# Patient Record
Sex: Male | Born: 1970 | Race: Black or African American | Hispanic: No | Marital: Single | Smoking: Former smoker
Health system: Southern US, Community
[De-identification: ages and names within clinical notes are randomized; demographics above are authoritative.]

## PROBLEM LIST (undated history)

## (undated) DIAGNOSIS — D649 Anemia, unspecified: Secondary | ICD-10-CM

## (undated) DIAGNOSIS — D759 Disease of blood and blood-forming organs, unspecified: Secondary | ICD-10-CM

## (undated) DIAGNOSIS — R011 Cardiac murmur, unspecified: Secondary | ICD-10-CM

## (undated) DIAGNOSIS — M1990 Unspecified osteoarthritis, unspecified site: Secondary | ICD-10-CM

## (undated) DIAGNOSIS — Z940 Kidney transplant status: Secondary | ICD-10-CM

## (undated) DIAGNOSIS — Z5189 Encounter for other specified aftercare: Secondary | ICD-10-CM

## (undated) DIAGNOSIS — F329 Major depressive disorder, single episode, unspecified: Secondary | ICD-10-CM

## (undated) DIAGNOSIS — T4145XA Adverse effect of unspecified anesthetic, initial encounter: Secondary | ICD-10-CM

## (undated) DIAGNOSIS — I77810 Thoracic aortic ectasia: Secondary | ICD-10-CM

## (undated) DIAGNOSIS — I2510 Atherosclerotic heart disease of native coronary artery without angina pectoris: Secondary | ICD-10-CM

## (undated) DIAGNOSIS — I428 Other cardiomyopathies: Secondary | ICD-10-CM

## (undated) DIAGNOSIS — I3139 Other pericardial effusion (noninflammatory): Secondary | ICD-10-CM

## (undated) DIAGNOSIS — I5032 Chronic diastolic (congestive) heart failure: Secondary | ICD-10-CM

## (undated) DIAGNOSIS — I10 Essential (primary) hypertension: Secondary | ICD-10-CM

## (undated) DIAGNOSIS — G4730 Sleep apnea, unspecified: Secondary | ICD-10-CM

## (undated) DIAGNOSIS — Z992 Dependence on renal dialysis: Secondary | ICD-10-CM

## (undated) DIAGNOSIS — J189 Pneumonia, unspecified organism: Secondary | ICD-10-CM

## (undated) DIAGNOSIS — N529 Male erectile dysfunction, unspecified: Secondary | ICD-10-CM

## (undated) DIAGNOSIS — I499 Cardiac arrhythmia, unspecified: Secondary | ICD-10-CM

## (undated) DIAGNOSIS — F419 Anxiety disorder, unspecified: Secondary | ICD-10-CM

## (undated) DIAGNOSIS — F909 Attention-deficit hyperactivity disorder, unspecified type: Secondary | ICD-10-CM

## (undated) DIAGNOSIS — IMO0001 Reserved for inherently not codable concepts without codable children: Secondary | ICD-10-CM

## (undated) DIAGNOSIS — N186 End stage renal disease: Secondary | ICD-10-CM

## (undated) DIAGNOSIS — B009 Herpesviral infection, unspecified: Secondary | ICD-10-CM

## (undated) DIAGNOSIS — T8859XA Other complications of anesthesia, initial encounter: Secondary | ICD-10-CM

## (undated) DIAGNOSIS — I313 Pericardial effusion (noninflammatory): Secondary | ICD-10-CM

## (undated) DIAGNOSIS — F32A Depression, unspecified: Secondary | ICD-10-CM

## (undated) DIAGNOSIS — F319 Bipolar disorder, unspecified: Secondary | ICD-10-CM

## (undated) HISTORY — DX: Pericardial effusion (noninflammatory): I31.3

## (undated) HISTORY — DX: Male erectile dysfunction, unspecified: N52.9

## (undated) HISTORY — DX: Depression, unspecified: F32.A

## (undated) HISTORY — PX: CHOLECYSTECTOMY: SHX55

## (undated) HISTORY — DX: Encounter for other specified aftercare: Z51.89

## (undated) HISTORY — DX: Bipolar disorder, unspecified: F31.9

## (undated) HISTORY — DX: Unspecified osteoarthritis, unspecified site: M19.90

## (undated) HISTORY — PX: MOUTH SURGERY: SHX715

## (undated) HISTORY — DX: Thoracic aortic ectasia: I77.810

## (undated) HISTORY — PX: KNEE SURGERY: SHX244

## (undated) HISTORY — DX: Attention-deficit hyperactivity disorder, unspecified type: F90.9

## (undated) HISTORY — DX: Other pericardial effusion (noninflammatory): I31.39

## (undated) HISTORY — DX: Anemia, unspecified: D64.9

## (undated) HISTORY — PX: ANGIOPLASTY: SHX39

## (undated) HISTORY — PX: DIALYSIS FISTULA CREATION: SHX611

## (undated) HISTORY — DX: Reserved for inherently not codable concepts without codable children: IMO0001

## (undated) HISTORY — PX: NEPHRECTOMY TRANSPLANTED ORGAN: SUR880

## (undated) HISTORY — DX: Other cardiomyopathies: I42.8

## (undated) HISTORY — DX: Kidney transplant status: Z94.0

## (undated) HISTORY — DX: Chronic diastolic (congestive) heart failure: I50.32

## (undated) HISTORY — DX: Major depressive disorder, single episode, unspecified: F32.9

## (undated) HISTORY — DX: Anxiety disorder, unspecified: F41.9

---

## 1999-07-14 ENCOUNTER — Encounter: Admission: RE | Admit: 1999-07-14 | Discharge: 1999-07-28 | Payer: Self-pay

## 1999-08-24 ENCOUNTER — Encounter: Admission: RE | Admit: 1999-08-24 | Discharge: 1999-09-01 | Payer: Self-pay

## 1999-12-12 ENCOUNTER — Emergency Department (HOSPITAL_COMMUNITY): Admission: EM | Admit: 1999-12-12 | Discharge: 1999-12-12 | Payer: Self-pay

## 2000-07-07 ENCOUNTER — Emergency Department (HOSPITAL_COMMUNITY): Admission: EM | Admit: 2000-07-07 | Discharge: 2000-07-08 | Payer: Self-pay | Admitting: Emergency Medicine

## 2002-03-07 ENCOUNTER — Emergency Department (HOSPITAL_COMMUNITY): Admission: EM | Admit: 2002-03-07 | Discharge: 2002-03-07 | Payer: Self-pay | Admitting: Emergency Medicine

## 2002-11-16 ENCOUNTER — Emergency Department (HOSPITAL_COMMUNITY): Admission: EM | Admit: 2002-11-16 | Discharge: 2002-11-16 | Payer: Self-pay | Admitting: Emergency Medicine

## 2004-07-20 ENCOUNTER — Emergency Department (HOSPITAL_COMMUNITY): Admission: EM | Admit: 2004-07-20 | Discharge: 2004-07-20 | Payer: Self-pay | Admitting: Emergency Medicine

## 2004-07-30 ENCOUNTER — Emergency Department (HOSPITAL_COMMUNITY): Admission: EM | Admit: 2004-07-30 | Discharge: 2004-07-30 | Payer: Self-pay | Admitting: Emergency Medicine

## 2004-11-07 ENCOUNTER — Emergency Department (HOSPITAL_COMMUNITY): Admission: EM | Admit: 2004-11-07 | Discharge: 2004-11-07 | Payer: Self-pay | Admitting: Emergency Medicine

## 2006-02-18 ENCOUNTER — Emergency Department (HOSPITAL_COMMUNITY): Admission: EM | Admit: 2006-02-18 | Discharge: 2006-02-18 | Payer: Self-pay | Admitting: Emergency Medicine

## 2007-07-22 ENCOUNTER — Emergency Department (HOSPITAL_COMMUNITY): Admission: EM | Admit: 2007-07-22 | Discharge: 2007-07-22 | Payer: Self-pay | Admitting: Emergency Medicine

## 2007-08-09 ENCOUNTER — Emergency Department (HOSPITAL_COMMUNITY): Admission: EM | Admit: 2007-08-09 | Discharge: 2007-08-09 | Payer: Self-pay | Admitting: Emergency Medicine

## 2007-11-03 ENCOUNTER — Observation Stay (HOSPITAL_COMMUNITY): Admission: EM | Admit: 2007-11-03 | Discharge: 2007-11-05 | Payer: Self-pay | Admitting: Emergency Medicine

## 2007-11-03 ENCOUNTER — Ambulatory Visit: Payer: Self-pay | Admitting: *Deleted

## 2008-02-05 ENCOUNTER — Inpatient Hospital Stay (HOSPITAL_BASED_OUTPATIENT_CLINIC_OR_DEPARTMENT_OTHER): Admission: RE | Admit: 2008-02-05 | Discharge: 2008-02-05 | Payer: Self-pay | Admitting: Cardiology

## 2008-03-06 ENCOUNTER — Encounter (INDEPENDENT_AMBULATORY_CARE_PROVIDER_SITE_OTHER): Payer: Self-pay | Admitting: Emergency Medicine

## 2008-03-06 ENCOUNTER — Inpatient Hospital Stay (HOSPITAL_COMMUNITY): Admission: EM | Admit: 2008-03-06 | Discharge: 2008-03-08 | Payer: Self-pay | Admitting: Emergency Medicine

## 2008-07-22 ENCOUNTER — Emergency Department (HOSPITAL_COMMUNITY): Admission: EM | Admit: 2008-07-22 | Discharge: 2008-07-22 | Payer: Self-pay | Admitting: Emergency Medicine

## 2008-11-28 ENCOUNTER — Ambulatory Visit: Payer: Self-pay | Admitting: Interventional Radiology

## 2008-11-28 ENCOUNTER — Emergency Department (HOSPITAL_BASED_OUTPATIENT_CLINIC_OR_DEPARTMENT_OTHER): Admission: EM | Admit: 2008-11-28 | Discharge: 2008-11-28 | Payer: Self-pay | Admitting: Emergency Medicine

## 2009-01-14 ENCOUNTER — Emergency Department (HOSPITAL_BASED_OUTPATIENT_CLINIC_OR_DEPARTMENT_OTHER): Admission: EM | Admit: 2009-01-14 | Discharge: 2009-01-14 | Payer: Self-pay | Admitting: Emergency Medicine

## 2011-02-16 ENCOUNTER — Emergency Department (HOSPITAL_COMMUNITY): Payer: Medicare Other

## 2011-02-16 ENCOUNTER — Inpatient Hospital Stay (HOSPITAL_COMMUNITY): Payer: Medicare Other

## 2011-02-16 ENCOUNTER — Inpatient Hospital Stay (HOSPITAL_COMMUNITY)
Admission: EM | Admit: 2011-02-16 | Discharge: 2011-02-24 | DRG: 291 | Disposition: A | Discharge status: Skilled Nursing Facility | Payer: Medicare Other | Attending: Family Medicine | Admitting: Family Medicine

## 2011-02-16 ENCOUNTER — Encounter (HOSPITAL_COMMUNITY): Payer: Self-pay | Admitting: Radiology

## 2011-02-16 DIAGNOSIS — N139 Obstructive and reflux uropathy, unspecified: Secondary | ICD-10-CM

## 2011-02-16 DIAGNOSIS — D649 Anemia, unspecified: Secondary | ICD-10-CM | POA: Diagnosis not present

## 2011-02-16 DIAGNOSIS — Z940 Kidney transplant status: Secondary | ICD-10-CM

## 2011-02-16 DIAGNOSIS — R319 Hematuria, unspecified: Secondary | ICD-10-CM | POA: Diagnosis present

## 2011-02-16 DIAGNOSIS — F4310 Post-traumatic stress disorder, unspecified: Secondary | ICD-10-CM | POA: Diagnosis present

## 2011-02-16 DIAGNOSIS — J9600 Acute respiratory failure, unspecified whether with hypoxia or hypercapnia: Secondary | ICD-10-CM | POA: Diagnosis present

## 2011-02-16 DIAGNOSIS — R109 Unspecified abdominal pain: Secondary | ICD-10-CM | POA: Diagnosis present

## 2011-02-16 DIAGNOSIS — N186 End stage renal disease: Secondary | ICD-10-CM

## 2011-02-16 DIAGNOSIS — Z91199 Patient's noncompliance with other medical treatment and regimen due to unspecified reason: Secondary | ICD-10-CM

## 2011-02-16 DIAGNOSIS — N3289 Other specified disorders of bladder: Secondary | ICD-10-CM | POA: Diagnosis present

## 2011-02-16 DIAGNOSIS — I120 Hypertensive chronic kidney disease with stage 5 chronic kidney disease or end stage renal disease: Secondary | ICD-10-CM | POA: Diagnosis present

## 2011-02-16 DIAGNOSIS — I501 Left ventricular failure: Secondary | ICD-10-CM

## 2011-02-16 DIAGNOSIS — J189 Pneumonia, unspecified organism: Secondary | ICD-10-CM | POA: Diagnosis present

## 2011-02-16 DIAGNOSIS — Z9119 Patient's noncompliance with other medical treatment and regimen: Secondary | ICD-10-CM

## 2011-02-16 DIAGNOSIS — D696 Thrombocytopenia, unspecified: Secondary | ICD-10-CM | POA: Diagnosis not present

## 2011-02-16 DIAGNOSIS — I517 Cardiomegaly: Secondary | ICD-10-CM | POA: Diagnosis present

## 2011-02-16 DIAGNOSIS — F319 Bipolar disorder, unspecified: Secondary | ICD-10-CM | POA: Diagnosis present

## 2011-02-16 DIAGNOSIS — C679 Malignant neoplasm of bladder, unspecified: Secondary | ICD-10-CM

## 2011-02-16 HISTORY — DX: Essential (primary) hypertension: I10

## 2011-02-16 LAB — CBC
HCT: 36.3 % — ABNORMAL LOW (ref 39.0–52.0)
Hemoglobin: 11.9 g/dL — ABNORMAL LOW (ref 13.0–17.0)
MCH: 31.6 pg (ref 26.0–34.0)
MCHC: 32.8 g/dL (ref 30.0–36.0)
MCV: 96.3 fL (ref 78.0–100.0)
Platelets: 128 K/uL — ABNORMAL LOW (ref 150–400)
RBC: 3.77 MIL/uL — ABNORMAL LOW (ref 4.22–5.81)
RDW: 13.5 % (ref 11.5–15.5)
WBC: 5.9 10*3/uL (ref 4.0–10.5)

## 2011-02-16 LAB — POCT I-STAT 3, ART BLOOD GAS (G3+)
Acid-Base Excess: 4 mmol/L — ABNORMAL HIGH (ref 0.0–2.0)
Bicarbonate: 29.1 meq/L — ABNORMAL HIGH (ref 20.0–24.0)
Bicarbonate: 30.3 meq/L — ABNORMAL HIGH (ref 20.0–24.0)
O2 Saturation: 72 %
O2 Saturation: 96 %
Patient temperature: 98
TCO2: 31 mmol/L (ref 0–100)
TCO2: 32 mmol/L (ref 0–100)
pCO2 arterial: 70.8 mmHg (ref 35.0–45.0)
pCO2 arterial: 49 mmHg — ABNORMAL HIGH (ref 35.0–45.0)
pH, Arterial: 7.221 — ABNORMAL LOW (ref 7.350–7.450)
pH, Arterial: 7.397 (ref 7.350–7.450)
pO2, Arterial: 47 mmHg — ABNORMAL LOW (ref 80.0–100.0)
pO2, Arterial: 81 mmHg (ref 80.0–100.0)

## 2011-02-16 LAB — BASIC METABOLIC PANEL WITH GFR
CO2: 23 meq/L (ref 19–32)
GFR calc non Af Amer: 5 mL/min — ABNORMAL LOW (ref >60)
Potassium: 4.6 meq/L (ref 3.5–5.1)

## 2011-02-16 LAB — DIFFERENTIAL
Basophils Absolute: 0 10*3/uL (ref 0.0–0.1)
Basophils Relative: 0 % (ref 0–1)
Eosinophils Absolute: 0.3 10*3/uL (ref 0.0–0.7)
Eosinophils Relative: 4 % (ref 0–5)
Lymphocytes Relative: 17 % (ref 12–46)
Lymphs Abs: 1 10*3/uL (ref 0.7–4.0)
Monocytes Absolute: 0.6 K/uL (ref 0.1–1.0)
Monocytes Relative: 10 % (ref 3–12)
Neutro Abs: 4 K/uL (ref 1.7–7.7)
Neutrophils Relative %: 69 % (ref 43–77)

## 2011-02-16 LAB — BASIC METABOLIC PANEL
BUN: 35 mg/dL — ABNORMAL HIGH (ref 6–23)
Calcium: 9.8 mg/dL (ref 8.4–10.5)
Chloride: 100 mEq/L (ref 96–112)
Creatinine, Ser: 12.37 mg/dL — ABNORMAL HIGH (ref 0.4–1.5)
GFR calc Af Amer: 6 mL/min — ABNORMAL LOW (ref >60)
Glucose, Bld: 115 mg/dL — ABNORMAL HIGH (ref 70–99)
Sodium: 138 mEq/L (ref 135–145)

## 2011-02-16 LAB — BLOOD GAS, ARTERIAL
Acid-Base Excess: 0.3 mmol/L (ref 0.0–2.0)
FIO2: 1 %
O2 Saturation: 98.1 %
Patient temperature: 98.6
pO2, Arterial: 123 mmHg — ABNORMAL HIGH (ref 80.0–100.0)

## 2011-02-16 LAB — GLUCOSE, CAPILLARY: Glucose-Capillary: 89 mg/dL (ref 70–99)

## 2011-02-16 LAB — CARDIAC PANEL(CRET KIN+CKTOT+MB+TROPI): CK, MB: 4.2 ng/mL — ABNORMAL HIGH (ref 0.3–4.0)

## 2011-02-16 MED ORDER — IOHEXOL 300 MG/ML  SOLN
100.0000 mL | Freq: Once | INTRAMUSCULAR | Status: AC | PRN
  Administered 2011-02-16: 100 mL via INTRAVENOUS

## 2011-02-17 ENCOUNTER — Inpatient Hospital Stay (HOSPITAL_COMMUNITY): Payer: Medicare Other

## 2011-02-17 DIAGNOSIS — J9600 Acute respiratory failure, unspecified whether with hypoxia or hypercapnia: Secondary | ICD-10-CM

## 2011-02-17 DIAGNOSIS — I10 Essential (primary) hypertension: Secondary | ICD-10-CM

## 2011-02-17 DIAGNOSIS — J189 Pneumonia, unspecified organism: Secondary | ICD-10-CM

## 2011-02-17 DIAGNOSIS — N186 End stage renal disease: Secondary | ICD-10-CM

## 2011-02-17 LAB — CBC
Hemoglobin: 10.4 g/dL — ABNORMAL LOW (ref 13.0–17.0)
MCH: 31.6 pg (ref 26.0–34.0)
Platelets: 76 10*3/uL — ABNORMAL LOW (ref 150–400)
RBC: 3.29 MIL/uL — ABNORMAL LOW (ref 4.22–5.81)

## 2011-02-17 LAB — MRSA PCR SCREENING: MRSA by PCR: NEGATIVE

## 2011-02-17 LAB — CARDIAC PANEL(CRET KIN+CKTOT+MB+TROPI)
CK, MB: 4.1 ng/mL — ABNORMAL HIGH (ref 0.3–4.0)
CK, MB: 3 ng/mL (ref 0.3–4.0)
Relative Index: 2.2 (ref 0.0–2.5)
Relative Index: 1.6 (ref 0.0–2.5)
Total CK: 184 U/L (ref 7–232)
Troponin I: 0.13 ng/mL — ABNORMAL HIGH (ref 0.00–0.06)

## 2011-02-17 LAB — COMPREHENSIVE METABOLIC PANEL
Alkaline Phosphatase: 88 U/L (ref 39–117)
BUN: 28 mg/dL — ABNORMAL HIGH (ref 6–23)
Chloride: 98 mEq/L (ref 96–112)
Creatinine, Ser: 10.54 mg/dL — ABNORMAL HIGH (ref 0.4–1.5)
Glucose, Bld: 82 mg/dL (ref 70–99)
Potassium: 4.9 mEq/L (ref 3.5–5.1)
Total Bilirubin: 1 mg/dL (ref 0.3–1.2)

## 2011-02-17 LAB — RENAL FUNCTION PANEL
Albumin: 3.4 g/dL — ABNORMAL LOW (ref 3.5–5.2)
CO2: 28 mEq/L (ref 19–32)
Calcium: 9.2 mg/dL (ref 8.4–10.5)
Chloride: 98 mEq/L (ref 96–112)
Creatinine, Ser: 10.53 mg/dL — ABNORMAL HIGH (ref 0.4–1.5)
GFR calc Af Amer: 7 mL/min — ABNORMAL LOW (ref >60)
GFR calc non Af Amer: 6 mL/min — ABNORMAL LOW (ref >60)
Sodium: 137 mEq/L (ref 135–145)

## 2011-02-17 LAB — CK TOTAL AND CKMB (NOT AT ARMC)
CK, MB: 2.2 ng/mL (ref 0.3–4.0)
Relative Index: 2.1 (ref 0.0–2.5)

## 2011-02-17 LAB — POCT I-STAT 3, ART BLOOD GAS (G3+)
pCO2 arterial: 49.1 mmHg — ABNORMAL HIGH (ref 35.0–45.0)
pH, Arterial: 7.398 (ref 7.350–7.450)

## 2011-02-18 ENCOUNTER — Inpatient Hospital Stay (HOSPITAL_COMMUNITY): Payer: Medicare Other

## 2011-02-18 LAB — CBC
Hemoglobin: 9.8 g/dL — ABNORMAL LOW (ref 13.0–17.0)
Platelets: 87 10*3/uL — ABNORMAL LOW (ref 150–400)
RBC: 3.12 MIL/uL — ABNORMAL LOW (ref 4.22–5.81)
WBC: 6.8 10*3/uL (ref 4.0–10.5)

## 2011-02-18 LAB — PROCALCITONIN: Procalcitonin: 20.64 ng/mL

## 2011-02-18 LAB — BASIC METABOLIC PANEL
CO2: 29 mEq/L (ref 19–32)
Chloride: 97 mEq/L (ref 96–112)
GFR calc Af Amer: 9 mL/min — ABNORMAL LOW (ref >60)
Potassium: 4.3 mEq/L (ref 3.5–5.1)
Sodium: 136 mEq/L (ref 135–145)

## 2011-02-18 LAB — GLUCOSE, CAPILLARY: Glucose-Capillary: 81 mg/dL (ref 70–99)

## 2011-02-19 ENCOUNTER — Inpatient Hospital Stay (HOSPITAL_COMMUNITY): Payer: Medicare Other

## 2011-02-19 LAB — CBC
HCT: 25.9 % — ABNORMAL LOW (ref 39.0–52.0)
Hemoglobin: 8.7 g/dL — ABNORMAL LOW (ref 13.0–17.0)
MCH: 31.8 pg (ref 26.0–34.0)
MCHC: 33.6 g/dL (ref 30.0–36.0)
MCV: 94.5 fL (ref 78.0–100.0)
Platelets: 94 K/uL — ABNORMAL LOW (ref 150–400)
RBC: 2.74 MIL/uL — ABNORMAL LOW (ref 4.22–5.81)
RDW: 13.3 % (ref 11.5–15.5)
WBC: 5.7 K/uL (ref 4.0–10.5)

## 2011-02-19 LAB — RENAL FUNCTION PANEL
Albumin: 2.8 g/dL — ABNORMAL LOW (ref 3.5–5.2)
BUN: 51 mg/dL — ABNORMAL HIGH (ref 6–23)
CO2: 28 meq/L (ref 19–32)
Calcium: 9.4 mg/dL (ref 8.4–10.5)
Chloride: 98 meq/L (ref 96–112)
Creatinine, Ser: 11.91 mg/dL — ABNORMAL HIGH (ref 0.4–1.5)
GFR calc non Af Amer: 5 mL/min — ABNORMAL LOW
Glucose, Bld: 109 mg/dL — ABNORMAL HIGH (ref 70–99)
Phosphorus: 5.7 mg/dL — ABNORMAL HIGH (ref 2.3–4.6)
Potassium: 4.1 meq/L (ref 3.5–5.1)
Sodium: 136 meq/L (ref 135–145)

## 2011-02-19 LAB — SURGICAL PCR SCREEN
MRSA, PCR: NEGATIVE
Staphylococcus aureus: POSITIVE — AB

## 2011-02-19 LAB — CROSSMATCH
ABO/RH(D): A POS
Antibody Screen: NEGATIVE

## 2011-02-19 NOTE — H&P (Signed)
NAMEKOBYN, MEDAL                  ACCOUNT NO.:  [id number]  MEDICAL RECORD NO.:  [id number]           PATIENT TYPE:  I  LOCATION:  2104                         FACILITY:  [place]  PHYSICIAN:  [place] A. Walker Kehr, M.D.    DATE OF BIRTH:  March 02, 1971  DATE OF ADMISSION:  02/16/2011 DATE OF DISCHARGE:                             HISTORY & PHYSICAL   PRIMARY CARE PROVIDER:  No PCP, [facility].  CHIEF COMPLAINT:  Respiratory distress secondary to acute pulmonary edema, hypertension, pelvic pain.  HISTORY OF PRESENT ILLNESS:  Level-5 caveat applies to the patient.  Per ED note, the patient is a 40 year old male with a history of end-stage renal disease on hemodialysis and bladder cancer who presents to the 1- day history of severe pelvic and suprapubic pain.  The patient underwent cystoscopy with removal of a small tumor yesterday.  The patient removed his Foley this morning and endorsed increasing suprapubic and abdominal pain throughout the day.  In the emergency department, a Foley catheter was unable to be inserted and Urology decided that the Foley was not necessary.  Blood pressure continued to increase likely secondary to severe pain and missed dialysis appoinments.  Max blood pressure systolic  [id number] over diastolic 0000000. Additionally, the patient's O2 sats decreased to the 80s and patient was started on BiPAP in ED which improved O2 sats to 90%.  In the ED,  patient went into acute pulmonary edema.  A chest x-ray showed bilateral lung consolidation.  Renal and Urology were consulted in the ED.  The patient was immediately taken to dialysis and will be admitted to an ICU bed.  ALLERGIES:  NITRATES.  MEDICATIONS:  Clonidine, Carbatrol, and Abilify (awaiting med rec for dosages).  PAST MEDICAL HISTORY: 1. Bladder cancer status post cystoscopy with biopsy on February 15, 2011. 2. ESRD on hemodialysis, status post failed renal transplant. 3. Hypertension. 4.  Psychiatric disease, likely Bipolar based on meds 5. CHF, EF of 60% in 2009.  PAST SURGICAL HISTORY:  Renal transplant.  SOCIAL HISTORY:  Living situation is deferred secondary to the patient's level-5 caveat.  Per ED note, the patient denies tobacco, alcohol, or drug use.  FAMILY HISTORY:  Deferred.  REVIEW OF SYSTEMS:  Pertinent for shortness of breath and abdominal and pelvic pain.  PHYSICAL EXAMINATION:  VITAL SIGNS:  Temperature 97.5, pulse 93-112, respirations 16-39, blood pressure systolic 0000000 over diastolic [id number], pulse ox 90% on BiPAP. GENERAL:  Obtunded, in moderate to severe distress. HEENT:  NCAT.  EOMI.  PERRLA.  BiPAP in place. NECK:  Positive JVD.  Supple. CARDIOVASCULAR:  Tachycardic, regular rhythm.  No murmurs. LUNGS:  Clear to auscultation bilaterally anteriorly. ABDOMEN:  Moderately distended.  Active bowel sounds.  Tenderness on palpation of the right lower quadrant and suprapubic region. GU:  Bright red blood per urethra secondary to trauma. EXTREMITIES:  Skin warm and dry.  No cyanosis or clubbing.  +1 pedal pitting edema bilaterally. NEURO:  Deferred. MSK:  Dorsalis pedis pulses strong and equal bilaterally.  LABORATORY DATA AND STUDIES:  CBC; white count 5.9, hemoglobin 11.9,  hematocrit 36.3, platelets 128.  BMET; sodium 138, potassium 4.6, chloride 100, CO2 of 23, BUN 35, creatinine 12.37, glucose 115, pH 7.2, CO2 of 70.8, O2 of 47.0, bicarb 29.1.  Chest x-ray showed bilateral lung consolidation consistent with acute pneumonia.  ASSESSMENT/PLAN:  This is a 40 year old with a history of end-stage renal disease and bladder cancer who presents with acute pulmonary edema, HTN,  and severe pelvic pain. 1. Respiratory.  In the ED, the patient went to acute pulmonary edema     likely secondary to hypertension causing acute left ventricular     heart failure. Appreciate the renal consult for immediate dialysis.  After dialysis, patient will     be  transferred to the ICU.  The patient's O2 sats are currently     stable on BiPAP.  We will continue with BiPAP overnight and attempt     to wean to NRB as tolerated.  May consider consulting CCM     in the morning if respiratory distress does not improve.  We will     repeat an ABG and repeat a chest x-ray in the morning.  We will     monitor respiratory distress very closely. 2. Cardiology/hypertension.  Blood pressure in dialysis remains     200/100.  We will start clonidine 0.3 patch q. week.  Elevated     blood pressure likely secondary to noncompliance and severe pain.     We will treat the pain with morphine 2 mg IV q.4 h. as needed.     Will likely need to increase pain meds, but difficult to assess his     pain at this time due to level-5 caveat.  We may consider Dilaudid     or morphine drip in the morning if pain continues to be     uncontrolled.  For elevated blood pressures, we will start     labetalol 5 mg IV every 6 hours as needed for systolic blood     pressure over [id number], diastolic blood pressure over 100.  Goal blood     pressure is 160s-180s/80s-90s.  We will monitor this closely. 3. End-stage renal disease, on hemodialysis.  Appreciate renal consult     and recommendations.  We will recheck a BMET in the morning.  The     patient is in dialysis at this time. 4. Pelvic pain.  Please see #2. 5. Psych disorder, possibly bipolar disorder based on medications.  We     will hold the Abilify and Carbatrol for now.  We will restart his     meds when able to tolerate p.o. 6. FEN/GI.  N.p.o. 7. Prophylaxis.  SCDs until CT scan of abdomen and pelvis confirms no     active bleed or rupture. 8. Dispo.  Pending workup and clinical improvement.    ______________________________ Donnamarie Rossetti, MD   ______________________________ Arty Baumgartner. Walker Kehr, M.D.    ID/MEDQ  D:  02/16/2011  T:  02/17/2011  Job:  [id number]  Electronically Signed by Donnamarie Rossetti MD on 02/17/2011 [id number]  PM Electronically Signed by Candelaria Celeste M.D. on 02/19/2011 08:10:13 PM

## 2011-02-20 LAB — CBC
HCT: 27.8 % — ABNORMAL LOW (ref 39.0–52.0)
MCHC: 32.7 g/dL (ref 30.0–36.0)
MCV: 93.6 fL (ref 78.0–100.0)
RDW: 13.1 % (ref 11.5–15.5)

## 2011-02-20 LAB — BASIC METABOLIC PANEL
BUN: 38 mg/dL — ABNORMAL HIGH (ref 6–23)
Calcium: 9.8 mg/dL (ref 8.4–10.5)
GFR calc non Af Amer: 6 mL/min — ABNORMAL LOW (ref >60)
Glucose, Bld: 90 mg/dL (ref 70–99)

## 2011-02-21 ENCOUNTER — Inpatient Hospital Stay (HOSPITAL_COMMUNITY): Payer: Medicare Other

## 2011-02-21 LAB — DIFFERENTIAL
Basophils Relative: 1 % (ref 0–1)
Eosinophils Absolute: 0.5 10*3/uL (ref 0.0–0.7)
Eosinophils Relative: 15 % — ABNORMAL HIGH (ref 0–5)
Monocytes Absolute: 0.5 10*3/uL (ref 0.1–1.0)
Monocytes Relative: 16 % — ABNORMAL HIGH (ref 3–12)

## 2011-02-21 LAB — PTH, INTACT AND CALCIUM: PTH: 457 pg/mL — ABNORMAL HIGH (ref 14.0–72.0)

## 2011-02-21 LAB — RENAL FUNCTION PANEL
Calcium: 9.5 mg/dL (ref 8.4–10.5)
GFR calc Af Amer: 5 mL/min — ABNORMAL LOW (ref >60)
GFR calc non Af Amer: 4 mL/min — ABNORMAL LOW (ref >60)
Phosphorus: 8.4 mg/dL — ABNORMAL HIGH (ref 2.3–4.6)
Sodium: 134 mEq/L — ABNORMAL LOW (ref 135–145)

## 2011-02-21 LAB — CBC
HCT: 34.4 % — ABNORMAL LOW (ref 39.0–52.0)
MCHC: 33.6 g/dL (ref 30.0–36.0)
Platelets: 138 10*3/uL — ABNORMAL LOW (ref 150–400)
Platelets: 170 10*3/uL (ref 150–400)
RDW: 13 % (ref 11.5–15.5)
RDW: 14.7 % (ref 11.5–15.5)

## 2011-02-21 LAB — BASIC METABOLIC PANEL
BUN: 16 mg/dL (ref 6–23)
Calcium: 9.6 mg/dL (ref 8.4–10.5)
GFR calc non Af Amer: 9 mL/min — ABNORMAL LOW (ref >60)
Glucose, Bld: 110 mg/dL — ABNORMAL HIGH (ref 70–99)
Potassium: 4.4 mEq/L (ref 3.5–5.1)

## 2011-02-22 LAB — BASIC METABOLIC PANEL
BUN: 34 mg/dL — ABNORMAL HIGH (ref 6–23)
Calcium: 10.5 mg/dL (ref 8.4–10.5)
Creatinine, Ser: 9.56 mg/dL — ABNORMAL HIGH (ref 0.4–1.5)
GFR calc non Af Amer: 6 mL/min — ABNORMAL LOW (ref >60)
Glucose, Bld: 101 mg/dL — ABNORMAL HIGH (ref 70–99)
Potassium: 4.2 mEq/L (ref 3.5–5.1)

## 2011-02-22 LAB — CBC
HCT: 30.6 % — ABNORMAL LOW (ref 39.0–52.0)
MCHC: 33.3 g/dL (ref 30.0–36.0)
Platelets: 146 10*3/uL — ABNORMAL LOW (ref 150–400)
RDW: 13.3 % (ref 11.5–15.5)

## 2011-02-23 ENCOUNTER — Inpatient Hospital Stay (HOSPITAL_COMMUNITY): Payer: Medicare Other

## 2011-02-23 LAB — RENAL FUNCTION PANEL
Albumin: 3 g/dL — ABNORMAL LOW (ref 3.5–5.2)
Calcium: 9.3 mg/dL (ref 8.4–10.5)
Chloride: 95 mEq/L — ABNORMAL LOW (ref 96–112)
Creatinine, Ser: 13.36 mg/dL — ABNORMAL HIGH (ref 0.4–1.5)
GFR calc Af Amer: 5 mL/min — ABNORMAL LOW (ref >60)
GFR calc non Af Amer: 4 mL/min — ABNORMAL LOW (ref >60)

## 2011-02-23 LAB — CULTURE, BLOOD (ROUTINE X 2)
Culture  Setup Time: 201204121231
Culture  Setup Time: 201204121231
Culture: NO GROWTH

## 2011-02-23 LAB — CBC
MCH: 31.1 pg (ref 26.0–34.0)
Platelets: 182 10*3/uL (ref 150–400)
RBC: 3.25 MIL/uL — ABNORMAL LOW (ref 4.22–5.81)

## 2011-02-24 LAB — CBC
HCT: 30.7 % — ABNORMAL LOW (ref 39.0–52.0)
Hemoglobin: 10 g/dL — ABNORMAL LOW (ref 13.0–17.0)
MCH: 30.4 pg (ref 26.0–34.0)
MCHC: 32.6 g/dL (ref 30.0–36.0)
MCV: 93.3 fL (ref 78.0–100.0)
Platelets: 159 10*3/uL (ref 150–400)
RBC: 3.29 MIL/uL — ABNORMAL LOW (ref 4.22–5.81)
RDW: 13.2 % (ref 11.5–15.5)
WBC: 4.4 10*3/uL (ref 4.0–10.5)

## 2011-02-24 LAB — BASIC METABOLIC PANEL WITH GFR
BUN: 35 mg/dL — ABNORMAL HIGH (ref 6–23)
CO2: 29 meq/L (ref 19–32)
Calcium: 8.9 mg/dL (ref 8.4–10.5)
Chloride: 95 meq/L — ABNORMAL LOW (ref 96–112)
Creatinine, Ser: 10.12 mg/dL — ABNORMAL HIGH (ref 0.4–1.5)
GFR calc non Af Amer: 6 mL/min — ABNORMAL LOW
Glucose, Bld: 102 mg/dL — ABNORMAL HIGH (ref 70–99)
Potassium: 4 meq/L (ref 3.5–5.1)
Sodium: 133 meq/L — ABNORMAL LOW (ref 135–145)

## 2011-02-24 NOTE — Consult Note (Signed)
NAMEARAD, LEMBURG                  ACCOUNT NO.:  [id number]  MEDICAL RECORD NO.:  [id number]           PATIENT TYPE:  I  LOCATION:  [id number]                         FACILITY:  [place]  PHYSICIAN:  Marshall Cork. Jeffie Pollock, M.D.    DATE OF BIRTH:  Nov 12, 1970  DATE OF CONSULTATION:  02/18/2011 DATE OF DISCHARGE:                                CONSULTATION   CHIEF COMPLAINT:  Hematuria.  Briefly, [name] is a 40 year old African American male with end-stage renal disease on dialysis and failed transplant.  Earlier this week, he underwent resection of a bladder tumor at [facility] and was sent home with a Foley.  He took the Foley out on Wednesday and then developed severe pelvic pain.  He was seen in the emergency room here. Attempts at Foley catheter placement were unsuccessful, a CT scan revealed a large mass in the posterior bladder that was initially noted to be consistent with a tumor.  However, it is more likely a large clot ball.  He is having some bloody drainage from the penis at this time but significantly less pain.  His past history is significant for allergies to NITRATES.  MEDICATIONS:  Currently include clonidine, pantoprazole, Levaquin, nitroglycerin, labetalol, Tylenol, zolpidem, docusate, calcium carbonate, Phenergan as needed, hydroxyzine as needed.  PAST MEDICAL HISTORY:  Pertinent for renal failure as noted above and hypertension.  He has bipolar disorder and posttraumatic stress disorder.  He has had recent pulmonary edema this admission and had congestive heart failure in 2011 with and ejection fraction of 60%.  PAST SURGICAL HISTORY:  Pertinent for renal transplantation and recent TUR bladder tumor.  SOCIAL HISTORY:  Denies tobacco, alcohol, or drug use.  He works as a Transport planner.  FAMILY HISTORY:  Noncontributory.  REVIEW OF SYSTEMS:  He has reduced suprapubic pain and no further shortness of breath.  He is otherwise without complaints.  PHYSICAL  EXAMINATION:  VITAL SIGNS:  His blood pressure is 140/88, heart rate 85, and temperature 37.2. GENERAL:  He is well-developed, well-nourished African American male in no acute distress. ABDOMEN:  Soft with mild suprapubic tenderness with mass effect in the right lower quadrant from his transplant. GENITOURINARY:  There is blood at meatus but phallus is otherwise unremarkable.  Scrotum is unremarkable. EXTREMITIES:  AV fistula.  LABORATORY STUDIES:  Hemoglobin is 9.8 and platelets 87.  BUN is 25, creatinine is 8.4, his sodium is 136, and potassium is 4.3.  I have reviewed his CT films and report.  IMPRESSION: 1. Status post transurethral resection of bladder tumor postoperative     bleed now, a probable bladder for clots and blood at the urethral     meatus.  He has little urine output so he is not having acute     obstructive symptoms. 2. Recent pulmonary edema from fluid overload.  RECOMMENDATIONS:  He is reluctant to have a catheter attempted since that was failed x2 and has had problems with posttraumatic stress disorder and he is afraid that will aggravate his condition, so at this time, I am going to take him to the operating room in the morning  for cystoscopy with clot evacuation and fulguration.  The risks of bleeding, infection, bladder injury, and anesthetic complications were explained. He is agreeable to proceed with this and will be kept n.p.o.     Marshall Cork. Jeffie Pollock, M.D.     JJW/MEDQ  D:  02/18/2011  T:  02/19/2011  Job:  [id number]  Electronically Signed by Irine Seal M.D. on 02/24/2011 02:16:51 PM

## 2011-02-24 NOTE — Op Note (Signed)
[name], [name]                  ACCOUNT NO.:  [id number]  MEDICAL RECORD NO.:  [id number]           PATIENT TYPE:  I  LOCATION:  [id number]                         FACILITY:  [place]  PHYSICIAN:  Marshall Cork. Jeffie Pollock, M.D.    DATE OF BIRTH:  1971-05-26  DATE OF PROCEDURE: DATE OF DISCHARGE:                              OPERATIVE REPORT   PROCEDURE:  Cystoscopy, evacuation of clots and complex Foley catheter placement.  PREOPERATIVE DIAGNOSIS:  Clot retention.  POSTOPERATIVE DIAGNOSIS:  Clot retention with urethral false passage with urethral bleeding.  SURGEON:  Marshall Cork. Jeffie Pollock, MD  ANESTHESIA:  General.  SPECIMEN:  None.  DRAIN:  A 24-French 3-way Foley catheter.  COMPLICATIONS:  None.  INDICATIONS:  [name] is a 40 year old African American male with a recent cystoscopic procedure at [name] where he underwent resection of bladder tumor and fulguration of bleeding from a stitch from his renal transplant.  He presented to the [name] in severe pain on April 2011 and CT scanning revealed a large mass in the bladder.  Then on my review, it appears to be most consistent with clot.  He has had persistent urethral bleeding and it was felt that cystoscopy and clot evacuation was indicated.  FINDINGS AND PROCEDURE:  He had been on Levaquin.  He was taken to the operating room where [name] were placed.  A general anesthetic was induced.  He was placed in lithotomy position.  His perineum and genitalia were prepped with Betadine solution.  He was draped in usual sterile fashion.  Time-out was performed.  Cystoscopy was performed using a 22-French scope and 12 degree lens examination revealed a normal anterior urethra.  However, the area of the bulb encountered some clot with bleeding and what appeared to be a posterior false passage with only significant difficulty.  I was able to eventually identify the true lumen and advance the scope into the bladder.  Once in the bladder,  I identified a large clot.  This was evacuated with the [facility] syringe. However, it was quite difficult as some of the clot had become organized, however, I was eventually able to get the entire clot out. Inspection of the bladder at this point demonstrated some hemorrhagic changes but no obvious active bleeding from the bladder mucosa. However, the urine would not clear despite repeated irrigation and inspection revealed what appeared to be some oozing at the bladder neck related to the false passage.  At this point, a guidewire was passed through the scope and initially a 24-French Ainsworth catheter was passed over the guidewire into the bladder.  The balloon was filled with 30 mL sterile fluid.  The catheter was held on traction while irrigated by hand.  I was never able to get the urine to clear sufficient to my satisfaction, particularly in face of the patient's severe oliguria as I did not think he would be to able clear even small amount of blood from the bladder.  At this point, a 24-French Rolanda Jay was removed over the wire and a 24- [place] 3-way Foley catheter was inserted after the tip had been  modified to allow passage over a guidewire.  Once the 24 catheter was in the bladder, the balloon was filled with 30 mL of sterile fluid.  The catheter was irrigated with the [place] syringe with just light pink return while held on traction.  The catheter was then placed to continuous irrigation and straight drainage and secured on traction to the patient's thigh.  He was taken down from lithotomy position.  His anesthetic was reversed.  He was moved to the recovery room in stable condition.  There were no complications.     Marshall Cork. Jeffie Pollock, M.D.     JJW/MEDQ  D:  02/19/2011  T:  02/19/2011  Job:  [id number]  cc:   Patrick [place] A. Walker Kehr, M.D. Sigmund I. Gaynelle Arabian, M.D. [place] Rosana Hoes, M.D. [facility]  Electronically Signed by Irine Seal M.D. on 02/24/2011 02:16:54 PM

## 2011-02-25 ENCOUNTER — Emergency Department (HOSPITAL_COMMUNITY)
Admission: EM | Admit: 2011-02-25 | Discharge: 2011-02-25 | Disposition: A | Discharge status: Home / Self Care | Payer: Medicare Other | Attending: Emergency Medicine | Admitting: Emergency Medicine

## 2011-02-25 DIAGNOSIS — F329 Major depressive disorder, single episode, unspecified: Secondary | ICD-10-CM | POA: Insufficient documentation

## 2011-02-25 DIAGNOSIS — R319 Hematuria, unspecified: Secondary | ICD-10-CM | POA: Insufficient documentation

## 2011-02-25 DIAGNOSIS — K219 Gastro-esophageal reflux disease without esophagitis: Secondary | ICD-10-CM | POA: Insufficient documentation

## 2011-02-25 DIAGNOSIS — F3289 Other specified depressive episodes: Secondary | ICD-10-CM | POA: Insufficient documentation

## 2011-02-25 DIAGNOSIS — Z79899 Other long term (current) drug therapy: Secondary | ICD-10-CM | POA: Insufficient documentation

## 2011-02-25 DIAGNOSIS — Z992 Dependence on renal dialysis: Secondary | ICD-10-CM | POA: Insufficient documentation

## 2011-02-25 DIAGNOSIS — I120 Hypertensive chronic kidney disease with stage 5 chronic kidney disease or end stage renal disease: Secondary | ICD-10-CM | POA: Insufficient documentation

## 2011-02-25 DIAGNOSIS — N186 End stage renal disease: Secondary | ICD-10-CM | POA: Insufficient documentation

## 2011-02-25 DIAGNOSIS — Z7982 Long term (current) use of aspirin: Secondary | ICD-10-CM | POA: Insufficient documentation

## 2011-02-25 DIAGNOSIS — Z8551 Personal history of malignant neoplasm of bladder: Secondary | ICD-10-CM | POA: Insufficient documentation

## 2011-02-25 LAB — DIFFERENTIAL
Basophils Absolute: 0 10*3/uL (ref 0.0–0.1)
Lymphocytes Relative: 41 % (ref 12–46)
Neutro Abs: 1.2 10*3/uL — ABNORMAL LOW (ref 1.7–7.7)
Neutrophils Relative %: 37 % — ABNORMAL LOW (ref 43–77)

## 2011-02-25 LAB — CBC
HCT: 32.8 % — ABNORMAL LOW (ref 39.0–52.0)
Hemoglobin: 11 g/dL — ABNORMAL LOW (ref 13.0–17.0)
RDW: 13.1 % (ref 11.5–15.5)
WBC: 3.3 10*3/uL — ABNORMAL LOW (ref 4.0–10.5)

## 2011-03-05 NOTE — Consult Note (Signed)
  NAMEEBBIE, BOURBON                  ACCOUNT NO.:  [id number]  MEDICAL RECORD NO.:  [id number]           PATIENT TYPE:  LOCATION:                                 FACILITY:  PHYSICIAN:  Arvil Persons, M.D.  DATE OF BIRTH:  [birth year]  DATE OF CONSULTATION: DATE OF DISCHARGE:                                CONSULTATION   ADDENDUM  The patient was seen by Franco Collet and he had a TUR bladder tumor at [facility] yesterday and he came to the emergency room today, complaining of abdominal pain.  The nurses inserted a Foley catheter, but could not irrigate the catheter.  Melissa removed the Foley catheter and she felt that the catheter was not in the bladder, but was unable to reinsert a #16, #18, or #14-French Coude catheter in the bladder.  I went to the emergency room to see him, but by that time, his condition had deteriorated, and he apparently went into pulmonary edema and was moved to the dialysis unit.  He has a history of renal failure with a kidney transplant in the past, but his creatinine was 12.37 and BUN 35.  The bladder scan showed only 140 mL of urine in the bladder.  At this point, I discussed it with Dr. Moshe Cipro, and we agreed that the patient does not need a Foley catheter at this time since he has minimal urinary output and we will leave Foley catheter out at this time, and if it is needed, they will call me back for Foley catheter insertion, at which time, I would do cystoscopy to insert the Foley catheter in the bladder.     Arvil Persons, M.D.     MN/MEDQ  D:  02/16/2011  T:  02/17/2011  Job:  [id number]  Electronically Signed by Hanley Ben M.D. on 03/05/2011 10:53:50 AM

## 2011-03-22 NOTE — Discharge Summary (Signed)
[name], [name]                  ACCOUNT NO.:  [id number]   MEDICAL RECORD NO.:  [id number]          PATIENT TYPE:  OBV   LOCATION:  3711                         FACILITY:  [facility]   PHYSICIAN:  Jerline Pain, MD      DATE OF BIRTH:  [birth year]   DATE OF ADMISSION:  11/03/2007  DATE OF DISCHARGE:                               DISCHARGE SUMMARY   DISCHARGE DIAGNOSES:  1. Chest pain, resolved.  2. End stage renal disease s/p failed transplant.  3. Hypertension; recent increase in October 2008  4. Long term medication use.  5. Pericardial effusion - likely uremic  6. LV systolic dysfunction - EF 40-45%, likely HTN etiology (no prior      cath)   HOSPITAL COURSE:  [name] is a 40 year old male patient who was just  seen Friday in our clinic.  He was started on Bidil for hypertension but  stated after he took a dose he had a headache and then developed chest  pain.  He was hospitalized over the weekend. His enzymes were  essentially negative. His troponin was normal.  He had a maximum CK  around 249 but MB fractions were low and non-relative.   After two days in the hospital we felt that he was ready for discharge  to home.  We switched him from Bidil over to hydralazine.   DISCHARGE MEDICATIONS:  1. Coreg 50 mg twice a day.  2. Lisinopril 40 mg daily.  3. Hydralazine 25 mg t.i.d.  4. Catapres 0.3 mg patch.  5. Ativan p.r.n.   He has had intolerance to Amlodipine due to lower extremity edema which  does not resolve with dialysis.   He is to stop his Bidil.   FOLLOWUP:  He will follow up with Dr. Marlou Porch in one week.  I will make  this followup appointment for him.  He is to continue his hemodialysis  every Monday, Wednesday and Friday.  He is to complete stress portion of  nuclear stress test (adenosine).   DIET:  He is to continue on his renal diet.  Time spent on discharge: >90min.      Joesphine Bare, P.A.      Jerline Pain, MD  Electronically Signed    LB/MEDQ   D:  11/05/2007  T:  11/05/2007  Job:  [id number]

## 2011-03-22 NOTE — H&P (Signed)
NAME:  [name], [name]                  ACCOUNT NO.:  [id number]   MEDICAL RECORD NO.:  [id number]          PATIENT TYPE:  EMS   LOCATION:  MAJO                         FACILITY:  [facility]   PHYSICIAN:  Hind I Elsaid, MD      DATE OF BIRTH:  1971/03/28   DATE OF ADMISSION:  03/06/2008  DATE OF DISCHARGE:                              HISTORY & PHYSICAL   CHIEF COMPLAINT:  Fever and shivering for 1 day.   HISTORY OF PRESENT ILLNESS:  This is a 40 year old male with history of  end-stage renal disease status post failed transplant on hemodialysis,  Monday, Wednesday, and Friday.  He had history of hypertension, left  ventricular systolic dysfunction with ejection fraction 40-45%.  The  patient admitted to the hospital today with chief complaint of fever.  The patient woke up today at 1 a.m. with very high-grade fever  associated with chills and sweating and difficulty breathing.  Also,  condition was associated with mild palpitations.  The patient denies any  sore throat.  The patient denies any abnormal skin rash.   REVIEW OF SYSTEMS:  The patient admitted.  He had nausea, and vomited  twice this morning, mainly food particles.  He denies any hematuria or  hematemesis.   PAST MEDICAL HISTORY:  1. History of chest pain.  2. Status post cardiac cath with normal coronary angiography.  3. End-stage renal disease status post failed transplant.  4. Status post left arm AV graft.  5. End-stage renal disease, on hemodialysis, Monday, Wednesday, and      Friday.  6. Left ventricular systolic dysfunction with ejection fraction of      45%.   The patient will follow up with Dr. Candee Furbish for control of his blood  pressures.   ALLERGIES:  NITROGLYCERIN.   PAST SURGICAL HISTORY:  1. Status post right kidney transplant in June 1999.  2. Cholecystectomy.  3. Right knee arthroscopy in 1995.  4. Open rhinoplasty in 1996.   SOCIAL HISTORY:  He denies any smoking.  He denies any IV drug use.   He  denies any alcohol abuse.  He works as Control and instrumentation engineer.   PAST FAMILY HISTORY:  Positive for diabetes, hypertension, and arthritis  in both mother and father.   MEDICATIONS:  1. Catapres 0.3 mg patch every week.  2. Minoxidil 2.5 mg twice daily.  3. Lisinopril 40 mg daily.  4. Sensipar 30 mg daily.  5. Forsenol two tablets with each meals.  6. Zantac 150 mg twice daily.   PHYSICAL EXAMINATION:  VITAL SIGNS:  Temperature 102.9, blood pressure  151/81, pulse rate 108, respiratory rate 18, and saturation 96% on room  air.  GENERAL:  The patient lying comfortably in bed, not in respiratory  distress or shortness of breath.  HEENT:  Normocephalic and atraumatic.  Pupils equally reactive to light  and accommodation.  Extraocular muscle movements within normal.  HEART:  S1 and S2.  There is no added sound.  LUNGS:  Positive for crackles on the right lung, mid and lower lobes.  ABDOMEN:  Soft and  nontender.  Bowel sounds.  EXTREMITIES:  Both lower extremities, mild lower limb edema bilaterally.   LABORATORY DATA:  Blood Workup:  Sodium 138, potassium 4.7, chloride  102, BUN 44, creatinine 11.9, glucose 85; hemoglobin 15, hematocrit 44,  white blood cells 6, and platelets 147.  Chest x-ray:  Severe cardiomegaly, right hemothorax, airspace disease,  most consistent with infection or asymmetric abdominal edema, aspiration  felt unlikely.   ASSESSMENT AND PLAN:  1. Right lobe pneumonia.  The patient will be treated as community      acquired pneumonia, mainly with Zithromax and Rocephin.  We will      repeat chest x-ray for evaluation of complete resolution of the      above.  Follow the patient's clinical status.  2. Hypertension.  The patient to continue his home medications.  3. Cardiomegaly.  Cannot rule pericardial effusion.  We will get 2-D      echo.  4. End-stage renal disease.  The patient to resume hemodialysis as      scheduled.  We will consult Nephrology to  evaluate.  Deep vein      thrombosis and gastrointestinal prophylaxes.      Hind Franco Collet, MD  Electronically Signed     HIE/MEDQ  D:  03/06/2008  T:  03/06/2008  Job:  [id number]

## 2011-03-22 NOTE — Consult Note (Signed)
[name], [name]                  ACCOUNT NO.:  [id number]   MEDICAL RECORD NO.:  [id number]          PATIENT TYPE:  EMS   LOCATION:  ED                           FACILITY:  [facility]   PHYSICIAN:  Doree Albee, M.D.DATE OF BIRTH:  05/22/1971   DATE OF CONSULTATION:  08/09/2007  DATE OF DISCHARGE:                                 CONSULTATION   HISTORY:  This is a 40 year old, very pleasant, African-American  gentleman who has a history of kidney transplant in 1999 which  eventually led to failure and then dialysis starting in August of 2005.  He has dialysis on Monday, Wednesday, and Friday and [place] to [facility] [place]  He came in today because last evening  he started to get a headache and found his blood pressure was elevated.  On arrival to the emergency room his blood pressure was at 174/118  peaking to 195/116.  Since he has been here he has been given 20 mg of  labetalol IV push at 4:47 in the morning, followed by clonidine 0.1 mg  orally at 5:17 a.m. and then followed by labetalol 20 mg IV at 6:44.  The most recent blood pressure now is 166/109.  He feels better and his  headache has almost resolved now.  He does take Norvasc 10 mg b.i.d. and  labetalol 200 mg b.i.d. daily and cuts these doses in half when he has  dialysis.  He also takes Sensipar and Fosrenol for his phosphate levels.  He has not actually taken any Norvasc or labetalol today.   PHYSICAL EXAMINATION:  VITAL SIGNS:  Blood pressure now 166/109, pulse  80, respiratory rate 12, saturation 95%.  GENERAL:  He looks well, there are no focal neurological signs.  He is  alert and oriented.  CARDIOVASCULAR:  Heart sounds are present and normal.  He is not in  heart failure.  LUNGS:  Fields are clear.   INVESTIGATIONS:  Sodium 139, potassium 5, bicarbonate 33, BUN 29,  creatinine 12.35.  He tells me his creatinine is in the 12 range  usually.   IMPRESSION:  1. Uncontrolled hypertension,  improved.  2. Chronic kidney disease on dialysis.   PLAN:  Blood pressure has improved and I am comfortable sending him home  with his usual medications as well as taking clonidine 0.1 mg b.i.d.  In  fact, we will give him a dose of Norvasc this morning before he leaves  and he will be going to his dialysis tomorrow.      Doree Albee, M.D.  Electronically Signed     NCG/MEDQ  D:  08/09/2007  T:  08/09/2007  Job:  [id number]

## 2011-03-22 NOTE — H&P (Signed)
[name], [name]                  ACCOUNT NO.:  [id number]   MEDICAL RECORD NO.:  [id number]          PATIENT TYPE:  OBV   LOCATION:  3711                         FACILITY:  [facility]   PHYSICIAN:  Jerline Pain, MD      DATE OF BIRTH:  Nov 27, 1970   DATE OF ADMISSION:  11/03/2007  DATE OF DISCHARGE:                              HISTORY & PHYSICAL   CHIEF COMPLAINT:  Headache and chest pain.   HISTORY OF PRESENT ILLNESS:  This is a 40 year old gentleman with  hypertension and end-stage renal disease on hemodialysis, who sees Dr.  Marlou Porch for blood pressure control, without a primary cardiac history.  He states that he was started on Bidil this week and took a first dose  earlier today.  He states about 45 minutes after taking this dose, he  developed headache with subsequent 5 out of 10 left-sided chest  pressure.  It was nonradiating but was associated with increased  shortness of breath and diaphoresis.  He did not take his blood pressure  at this time, and he has never had chest pain such as this before and he  became quite concerned as a result and called EMS.  In route via EMS, he  received sublingual nitroglycerin, which reduced the pain down to a 3  out of 10.  In the emergency department, he was given an aspirin and an  additional dose of nitroglycerin, which relieved his pain completely.  He currently does not complain of a headache or any chest discomfort.  He does state that during this episode, he did become slightly  lightheaded or did not have syncope.  He denies any palpitations.  He  did have an episode of nausea and emesis here in the emergency  department.  At this point in time, he denies any increase in lower  extremity edema, orthopnea, or PND.  He states he has been compliant  with his medications and has not missed any hemodialysis days.   ALLERGIES:  DILACOR.   PAST MEDICAL HISTORY:  1. End-stage renal disease since 2006.  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  2. Hypertension.   MEDICATIONS:  1. Carvedilol 50 mg b.i.d.  2. Lisinopril 40 mg daily.  3. Catapres 0.3 mg patch on Tuesdays.  4. Ativan 2 mg on hemodialysis days.  5. Bidil TID   SOCIAL HISTORY:  He lives in [place] with two roommates.  He is  currently in grad school to learn rehabilitation.  He denies any  tobacco, alcohol, or drug abuse.   FAMILY HISTORY:  His mother and father are both alive and both have  hypertension and diabetes.   REVIEW OF SYSTEMS:  As above in the HPI.  The remaining 12-point review  of systems is negative.   PHYSICAL EXAMINATION:  VITAL SIGNS:  Blood pressure is 163/106, pulse is  62, respiratory rate of 20, and O2 saturation is 100% on 2 liters nasal  cannula.  GENERAL:  He is alert and oriented x3 in no acute  distress.  HEENT EXAM:  Pupils equal, round, and reactive to light, normocephalic,  atraumatic, sclerae are anicteric, extraocular movements intact.  NECK:  Supple with no lymphadenopathy, no carotid bruits, JVP is flat.  LUNGS:  Clear to auscultation with some mild bibasilar crackles that do  not resolve with coughing.  CARDIAC:  Regular rate and rhythm, normal S1 and S2 without any murmurs,  rubs, or gallops.  ABDOMEN:  Soft, nontender, and nondistended with positive bowel sounds  with no hepatosplenomegaly, no palpable masses.  EXTREMITIES:  2+ radial and posterior tibialis pulses symmetric  bilaterally with trace bilateral lower extremity edema.  NEUROLOGIC EXAM:  Grossly nonfocal.   LABORATORY DATA:  Chest x-ray shows cardiomegaly and bilateral edema.  An EKG shows a normal sinus rhythm at a rate of 67 with left axis  deviation and some T-wave inversions laterally, which were old as  compared to an EKG in August 09, 2007.  A potassium of 5.3, bicarb of  30, a BNP of 4811.  The first set of cardiac markers:  CK-MB of 1,  troponin was less than 0.05.   ASSESSMENT:  1. Chest pain associated  with one dose of Biodel.  It is unclear      whether his symptoms are associated with that medication or      coincidental.  2. Hypertension.  3. End-stage renal disease.   PLAN:  The patient will be admitted to a telemetry bed.  He will be  ruled out for an acute MI with cardiac enzymes, but given his  presentation of symptoms it is unlikely.  I am concerned that some of  symptoms may have been due to some transient hypotension; however, he  came to the emergency room hypertensive with systolics in the [id number] and  diastolics in the 0000000 to 1-teens.  At this point in time, I will  continue him on his outpatient medications except for the Bidil.  I have  doubled his Lisinopril to 80 mg a day to maximize the dose of this  medication.  Since he is still having difficulty with blood pressure  control, certainly a calcium channel blocker such as Amlodipine may be  warranted to improve blood pressure control in this individual.   I will have Dr. Marlou Porch evaluate the patient in the morning and make  further decisions regarding the management of this patient.     ______________________________  Cyndi Lennert. Manuella Ghazi, MD      Jerline Pain, MD  Electronically Signed    BRS/MEDQ  D:  11/03/2007  T:  11/04/2007  Job:  [id number]

## 2011-03-22 NOTE — Consult Note (Signed)
[name], [name]                  ACCOUNT NO.:  [id number]   MEDICAL RECORD NO.:  [id number]          PATIENT TYPE:  INP   LOCATION:  2039                         FACILITY:  [facility]   PHYSICIAN:  Donato Heinz, M.D.DATE OF BIRTH:  Jul 17, 1971   DATE OF CONSULTATION:  03/06/2008  DATE OF DISCHARGE:                                 CONSULTATION   REASON FOR CONSULTATION:  1. Hypertension.  2. End-stage renal disease.  3. Pneumonia.  4. Hyperkalemia.   CONSULTING PHYSICIAN:  [facility].   HISTORY OF PRESENT ILLNESS:  [name] is 40 year old African American  male with a past medical history significant for end-stage renal disease  secondary to FSG, hypertension, and obesity who normally gets his  dialysis care in [place] [facility], and his primary nephrologist is Dr. Alyson Ingles, however, he  presented to [facility] with complaints of nausea, vomiting, and  elevated fevers with altered mental status.  He was subsequently found  to have pneumonia and was admitted for IV antibiotics.  We were asked to  help manage his other medical problems including hyperkalemia, dialysis,  as well as his anemia, and hypertension.   ALLERGIES:  He has allergies to DILTIAZEM and NORVASC.   PAST MEDICAL HISTORY:  1. End-stage renal disease secondary to early FSG.  2. Hypertension.  3. Anemia.  4. Obesity.  5. Secondary hyperparathyroidism.  6. Gastroesophageal reflux disease.  7. History of a failed cadaveric kidney transplant from 1999 and 2006.  8. Cardiac catheterization February 05, 2008, by Dr. Marlou Porch.  No lesions.      EF of 65%.   CURRENT MEDICATIONS:  1. Fosrenol 1.5 g with each meal 1 g with snacks.  2. Sensipar 30 a day.  3. Minoxidil 2.5 b.i.d.  4. Lisinopril 40 a day.  5. Catapres patch #3 q. Tuesday.  6. Coreg 50 mg b.i.d.  7. Zantac 150 mg b.i.d.   FAMILY HISTORY:  Significant for hypertension and diabetes.  No history  of kidney  disease.   SOCIAL HISTORY:  He has no children.  Currently lives in an apartment  with a roommate.  Denies tobacco, alcohol, or drug use.  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   REVIEW OF SYSTEMS:  GENERAL:  As per HPI some nausea, vomiting, fevers,  and fatigue.  CARDIAC:  No chest pain or palpitations.  PULMONARY:  No  shortness of breath or chest pain, but has had some cough.  GI:  No  nausea, vomiting, hematochezia, melena, or bright red blood per rectum.  GU:  No dysuria, pyuria, hematuria, urgency, or frequency.  RHEUMATOLOGIC:  No arthralgias.  All other systems are negative.   PHYSICAL EXAMINATION:  GENERAL:  He is a well-developed and well-  nourished man, in no apparent distress.  VITAL SIGNS:  Temperature 98.6, pulse 75, blood pressure 100/60,  respiratory rate is 18, and pulse ox 93% room air.  HEENT:  Normocephalic and atraumatic.  Extraocular muscles intact.  No  icterus.  LUNGS:  He has crackles at the right  base, otherwise clear to  auscultation.  CARDIAC:  Regular rate and rhythm.  No precordial rub appreciated.  ABDOMEN:  Normoactive bowel sounds, soft, nontender, and nondistended.  No guarding or rebound.  EXTREMITIES:  No clubbing, cyanosis, or edema.  He has a left forearm AV  fistula palpable with thrill and audible bruit, and a buttonhole in the  venous limb of his fistula.  NEUROLOGICAL:  Grossly intact.   LABORATORY DATA:  Sodium 137, potassium 5, chloride 102, CO2 25, BUN 45,  creatinine 14, and glucose 101.  White blood cell count 6.6, hemoglobin  13, platelets 147, calcium 8.6, and albumin 3.4.   ASSESSMENT AND PLAN:  1. Pneumonia.  He has a chest x-ray consistent with right lower lobe      pneumonia.  He is on antibiotics or renal dose.  He is markedly      improved per his report, and we will likely change him to      outpatient regimen.  2. End-stage renal disease, on dialysis every Monday, Wednesday, and      Friday.  Plan for  dialysis tomorrow, although he is reluctant to      have dialysis here and wants to go as an outpatient to his home      unit.  We will discuss with the primary team if this is possible      depending on whether or not he continues to improve.  3. Secondary hyperparathyroidism, continue with binders and Sensipar.  4. Anemia.  We will continue with EPO 6600 units.  5. Hypertension, blood pressures are at goal.  Continue to follow.  6. Vascular access.  He has an AV fistula with palpable thrill and      audible bruit.  He does have a buttonhole on the top.  We will      discuss this with the nurses before dialysis if he is to stay for      treatment tomorrow.   Thank you for this consultation.  We will continue to follow.           ______________________________  Donato Heinz, M.D.     JC/MEDQ  D:  03/06/2008  T:  03/07/2008  Job:  [id number]

## 2011-03-22 NOTE — Cardiovascular Report (Signed)
[name], [name]                  ACCOUNT NO.:  [id number]   MEDICAL RECORD NO.:  [id number]          PATIENT TYPE:  OIB   LOCATION:  1961                         FACILITY:  [facility]   PHYSICIAN:  Jerline Pain, MD      DATE OF BIRTH:  [birth year]   DATE OF PROCEDURE:  02/05/2008  DATE OF DISCHARGE:  02/05/2008                            CARDIAC CATHETERIZATION   PROCEDURE:  1. Left heart catheterization.  2. Selective coronary angiography.  3. Left ventriculogram.   INDICATIONS:  A 40 year old male with prior LV systolic dysfunction,  hypertension difficult to control, status post failed renal transplant  on dialysis with abnormal nuclear stress test with decreased uptake in  the inferolateral portion mid to distal of moderate degree and also mild  decreased uptake in the anterior wall mostly seen in the mid portion,  mild degree suggestive of possible ischemia.  The patient complained of  dyspnea on exertion, much improved on antihypertensives.   PROCEDURE DETAILS:  Prepped in a sterile fashion, placed on the  catheterization table, informed consent was obtained prior to procedure.  Risks of stroke, heart attack, death, damage to artery, bleeding were  explained fully to the patient.  While on the catheterization table,  fluoroscopic visualization of the femoral head was obtained.  Lidocaine  1% was used for local anesthesia.  Using the modified Seldinger  technique, a 4-French sheath was placed in the right femoral artery.  A  Judkins left #4 catheter was used to selectively cannulate the left main  artery.  Multiple views with Omnipaque were obtained.  This catheter was  then exchanged for a No Torque Williams catheter which was used to  selectively cannulate the right coronary artery.  Multiple views with  Omnipaque were obtained.  This catheter was then exchanged for an angled  pigtail which was used to cross the aortic valve.  Hemodynamics  obtained.  Left ventriculogram in  the RAO position using power injection  was obtained.  A catheter was then pulled across the aortic valve and  gradient obtained.  After the procedure, manual compression was held  after sheath removal.  No hematoma.  The patient tolerated the procedure  well.   FINDINGS:  1. Left main artery - breaks into LAD circumflex, no disease.  2. Left anterior descending artery - 2 diagonal branches, large      tortuous vessel, wraps around apex.  No angiographically      significant coronary artery disease.  3. Left circumflex artery - 2 obtuse marginal branches.  Circumflex      system is large, tortuous.  No angiographically significant      coronary artery disease.  4. Right coronary artery - this is the dominant vessel giving rise to      the PDA.  Large tortuous vessel.  No angiographically significant      coronary artery disease.  5. Left ventriculogram demonstrated normal ejection fraction of      approximately 65% with no wall motion abnormalities.  6. Hemodynamics:  Left ventricle systolic pressure was [id number] with a left  ventricular end-diastolic pressure of 13 mmHg.  Aortic pressure was      100/51 with a mean of 70 mmHg.   IMPRESSION:  1. No angiographically significant coronary artery disease.  2. Normal left ventricular ejection fraction estimated at 65% with no      wall motion abnormalities.  This is much improved from prior      echocardiogram demonstrate 40% to 45%.  3. No aortic valve gradient.   PLAN:  Continue aggressive antihypertensive control.  EF has returned to  normal with medical therapy.  Hemodialysis scheduled for tomorrow.  We  will see back in followup in 2 weeks.      Jerline Pain, MD  Electronically Signed     MCS/MEDQ  D:  02/05/2008  T:  02/05/2008  Job:  [id number]   cc:   Casimiro Needle, M.D.

## 2011-03-25 NOTE — Consult Note (Signed)
[name], [name]                  ACCOUNT NO.:  [id number]  MEDICAL RECORD NO.:  [id number]           PATIENT TYPE:  I  LOCATION:  [id number]                         FACILITY:  [facility]  PHYSICIAN:  Arvil Persons, M.D.  DATE OF BIRTH:  10/01/1971  DATE OF CONSULTATION:  02/16/2011 DATE OF DISCHARGE:  02/24/2011                                CONSULTATION   REASONS FOR CONSULTATION: 1. Lower abdominal pain. 2. Hematuria.  HISTORY OF PRESENT ILLNESS:  This is a 40 year old gentleman who is status post TUR - BT at [facility] on February 15, 2011.  He was sent home with an indwelling Foley catheter and Foley catheter leg bag.  He was instructed to remove Foley catheter himself earlier this morning.  The patient began complaining of severe abdominal pain and bloody urine was present.  The patient continues to remove the Foley catheter as was instructed.  He continued to complain of abdominal pain. He was found to have very little urine output, although he does have end- stage renal disease.  His mother was unable to get him into the car to go to [facility].  Therefore, 911 was called and brought him to [facility] Emergency Room.  Nursing placed an 29- French indwelling Foley catheter and attempted irrigation with approximately 5 mL of saline, but met immediate resistance.  The patient was found to have severe pain at that time.  Therefore, Urology was called.  The patient denies any complaints of fever, chills, nausea, vomiting, diarrhea, or constipation.  He denies any complaints of chest pain or shortness of breath.  He does complain of severe abdominal pain with right lower quadrant being greater than left lower quadrant.  PAST MEDICAL HISTORY: 1. Bladder cancer, status post cystoscopy with biopsy on June 17, 2011. 2. End-stage renal disease with hemodialysis. 3. Hypertension. 4. Posttraumatic stress disorder. 5. Heart failure with ejection  fraction of 60% in 2009.  PAST SURGICAL HISTORY: 1. Renal transplant. 2. Transurethral resection of bladder tumor.  FAMILY HISTORY:  Noncontributory.  The patient denies any family history of kidney cancer, prostate cancer, or bladder cancer.  SOCIAL HISTORY:  He lives with his mother in [place].  He denies any tobacco or alcohol use.  REVIEW OF SYSTEMS:  As stated per HPI.  PHYSICAL EXAMINATION:  VITAL SIGNS:  Temperature 97.5, pulse 86, respirations 20, blood pressure 206/141. CONSTITUTIONAL:  He is a well-developed and well-nourished white male in positive distress when Foley catheter was manipulated. HEENT:  Normocephalic, atraumatic.  Oropharynx is clear. ABDOMEN:  Soft, round with positive suprapubic tenderness and firmness to right lower quadrant area. EXTREMITIES:  1+ pitting edema.  Irrigation was attempted with the indwelling Foley catheter that was in place, but met resistance immediately.  The 18-French Foley was manipulated without success.  The balloon was removed and the Foley catheter was again reinserted with attempted placement, but met resistance.  This was consistent with a false passage.  Therefore, an 63- [place] and 16-French coude catheter placement was attempted with resistance approximately three force of the way in  with large amount of dark blood observed when catheter was removed.  IMPRESSION/PLAN: 1. Status post transurethral resection of bladder tumor on February 16, 2011, per Dr. Tresa Endo at [facility]. 2. False passage. 3. Possible clot retention.  Because I am unable to place     Foley due to false passage, will have Dr. Janice Norrie perform a cystoscopy for Foley     placement.  We want to do bladder scan to see if the patient is     to check his residuals.  Once Foley catheter is in place, we will obtain     CT cystogram to evaluate bladder if needed or perform clot evacuation.     Franco Collet,  NP   ______________________________ Arvil Persons, M.D.    MA/MEDQ  D:  03/16/2011  T:  03/17/2011  Job:  [id number]  Electronically Signed by Franco Collet NP on 03/18/2011 10:05:37 AM Electronically Signed by Hanley Ben M.D. on 03/25/2011 11:44:57 AM

## 2011-03-27 NOTE — Discharge Summary (Signed)
[name], [name]                  ACCOUNT NO.:  [id number]  MEDICAL RECORD NO.:  [id number]           PATIENT TYPE:  I  LOCATION:  6712                         FACILITY:  [facility]  PHYSICIAN:  Severn A. Walker Kehr, M.D.    DATE OF BIRTH:  Sep 04, 1971  DATE OF ADMISSION:  02/16/2011 DATE OF DISCHARGE:  02/23/2011                              DISCHARGE SUMMARY   PRIMARY CARE PROVIDER:  No PCP, but does see [facility].  DISCHARGE DIAGNOSES: 1. Pulmonary edema. 2. Hypertension. 3. End-stage renal disease. 4. Bladder carcinoma. 5. Psych disorder. 6. Suprapubic/pelvic pain.  DISCHARGE MEDICATIONS: 1. Lisinopril 20 mg 1 tablet b.i.d. 2. Lidocaine/tetracaine topical one application p.r.n. 3. Abilify 2 mg by mouth half a tablet daily. 4. Ativan 2 mg 1 tablet by mouth as needed before dialysis. 5. Coreg 25 mg 2 tablets by mouth b.i.d. 6. Clonidine 0.2 mg 2 tablets by mouth twice daily. 7. Amlodipine 5 mg 1 tablet by mouth b.i.d. 8. Zantac 75 mg 1 tablet by mouth daily. 9. Fosrenol 1000 mg 1 tablet by mouth three times a day. 10.Sensipar 30 mg 1 tablet by mouth daily. 11.Tylenol 325 mg 1-2 tablets every 6 hours as needed for pain. 12.Nepro with carb in liquid 237 mL by mouth three times a day as     needed. 13.Vicodin 5/325 one tablet by mouth every 6 hours as needed for pain. 14.Avelox 200 mg 1 tablet by mouth daily.  PROCEDURES: 1. On February 17, 2011, a CT of abdomen and pelvis with contrast:     a.     A 7.5 x 6.4 x 6.3 cm posterior bladder mass compatible with      the patient's known bladder cancer.     b.     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     c.     Small native kidney small cyst.     d.     Cardiomegaly.     e.     Marked ground-glass opacity throughout the majority of the      lung bases, this could represent a drug  reaction.     f.     Renal osteodystrophy. 2. Chest x-ray one-view:  No change in aeration to the lung compared     with prior exam. 3. February 16, 2011, chest two-view:     a.     Mild interval improvement in diffuse left-sided and right      basilar airspace opacity, likely reflecting improving extensive      multifocal pneumonia.     b.     Vascular congestion and cardiomegaly. 4. On February 18, 2011, chest x-ray showed some improvement in bilateral     airspace disease.  PERTINENT LABORATORY DATA AT DISCHARGE:  Sodium 133, potassium 4, chloride 95, CO2 of 25, BUN 54, creatinine 13.36.  Glucose 120-130. CBC; white count 5, hemoglobin 10.1, hematocrit 30.1, platelet  182. Blood cultures, no growth x5 days.  PTH was 457.  Procalcitonin was 20.64.  CONSULTS:  CCM, Nephrology and Urology.  BRIEF HOSPITAL COURSE:  This is a 40 year old male with a history of end- stage renal disease on hemodialysis and a history of bladder cancer, who presents with severe pelvic and suprapubic pain, hypertension, and acute pulmonary edema. 1. Pulmonary edema.  This was likely secondary to elevated blood     pressures causing acute left ventricular heart failure in addition     to a cystoscopy that was performed the day before admission at [facility].  The patient may have become fluid overloaded and then     missed his subsequent hemodialysis appointment and therefore may     have triggered this acute pulmonary edema.  When the patient was in     ED, his O2 sats decreased to 50s.  He was started on BiPAP with     some improvement.  Nephrology was consulted in the emergency     department and was seen by the renal physician on-call, who decided     the patient needed to go to dialysis emergently.  When we saw the     patient in dialysis, he was obtunded, all above criteria applied to     him at that time.  After dialysis, the patient was transferred to     the ICU where he was weaned off from BiPAP  to a non-rebreather and     then was weaned to nasal cannula on hospital day 3.  CCM was     formerly consulted and they recommended that the patient be started     on Levaquin 500 mg IV every other day.  This was transitioned to     Avelox 400 mg daily by mouth to cover for an typical pneumonia that     was seen on chest x-ray.  Renal recommended dialysis on hospital     day 2 and also hospital day 4 and then they continued with his     regular hemodialysis schedule on Monday, Wednesday, Friday while he     was here in hospital.  Urology was consulted and they recommended     doing a cystogram where they found a clot in his bladder, which was     removed and they placed a Foley and started continuous bladder     irrigation x24 hours.  They recommend that the patient leave the     Foley for 1 week and then have it removed at his regular urologist     office in [facility].  Dr. 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  After receiving dialysis, it     seemed that the patient's pulmonary edema improved significantly.     He no longer required any oxygen and he was alert, awake, oriented     x3.  The patient's IV meds were all transitioned to oral meds and     on day of discharge, the patient was back to baseline. 2. Hypertension.  Blood pressures were initially elevated on     admission.  The patient was started on labetalol 10 mg IV p.r.n.     for elevated blood pressures.  Throughout the hospital course, the     patient was restarted on his home medications when he was  able to     tolerate p.o. and his blood pressures became more stable.  On     hospital day 2, the patient's blood pressure was well controlled on     his home meds. 3. End-stage renal disease.  The patient received hemodialysis here in     the hospital on Monday, Wednesday, Friday.  He is to follow up with     [facility] after he is discharged.  The patient will be     discharged to a SNF facility where he should be transported to and     from [facility]. 4. Psych disorder, specifically PTSD with mood disorder.  We continued     the patient's home dose of Abilify.  Also, the patient was given     his home dose of Ativan 1 mg IV as needed prior to hemodialysis. 5. Pelvic pain.  The patient was initially started on IV morphine for     his suprapubic tenderness, however, he was then transitioned to     Vicodin 5/325 by mouth every 6 hours as needed for pain.  The     patient says the pain is mostly bladder spasms and some pain coming     from his urethra where the Foley is placed.  We will send the     patient home with a 2-week course of Vicodin.  He is to follow up     with his new PCP for further evaluation.  FOLLOWUP APPOINTMENTS: 1. SNF to schedule appointment with [facility]. 2. SNF to schedule appointment with Dr. Rosana Hoes in 1 week after     discharge.  DISCHARGE CONDITION:  The patient was discharged to SNF in stable medical condition.    ______________________________ Donnamarie Rossetti, MD   ______________________________ Arty Baumgartner Walker Kehr, M.D.    ID/MEDQ  D:  02/23/2011  T:  02/23/2011  Job:  [id number]  Electronically Signed by Donnamarie Rossetti MD on 03/09/2011 06:42:54 PM Electronically Signed by Candelaria Celeste M.D. on 03/27/2011 03:59:56 PM

## 2011-07-05 ENCOUNTER — Encounter: Payer: Self-pay | Admitting: Family Medicine

## 2011-07-05 ENCOUNTER — Ambulatory Visit (INDEPENDENT_AMBULATORY_CARE_PROVIDER_SITE_OTHER): Payer: Medicare Other | Admitting: Family Medicine

## 2011-07-05 DIAGNOSIS — I10 Essential (primary) hypertension: Secondary | ICD-10-CM

## 2011-07-05 DIAGNOSIS — I509 Heart failure, unspecified: Secondary | ICD-10-CM

## 2011-07-05 DIAGNOSIS — N5089 Other specified disorders of the male genital organs: Secondary | ICD-10-CM

## 2011-07-05 DIAGNOSIS — I2729 Other secondary pulmonary hypertension: Secondary | ICD-10-CM

## 2011-07-05 DIAGNOSIS — I2789 Other specified pulmonary heart diseases: Secondary | ICD-10-CM

## 2011-07-05 DIAGNOSIS — D638 Anemia in other chronic diseases classified elsewhere: Secondary | ICD-10-CM

## 2011-07-05 DIAGNOSIS — N186 End stage renal disease: Secondary | ICD-10-CM

## 2011-07-05 NOTE — Patient Instructions (Addendum)
It was good to see you again. Please follow up with me in 4 - 6 weeks.

## 2011-07-13 ENCOUNTER — Encounter: Payer: Self-pay | Admitting: Family Medicine

## 2011-07-13 DIAGNOSIS — N186 End stage renal disease: Secondary | ICD-10-CM

## 2011-07-13 DIAGNOSIS — D638 Anemia in other chronic diseases classified elsewhere: Secondary | ICD-10-CM

## 2011-07-13 DIAGNOSIS — I509 Heart failure, unspecified: Secondary | ICD-10-CM

## 2011-07-13 DIAGNOSIS — I2729 Other secondary pulmonary hypertension: Secondary | ICD-10-CM

## 2011-07-13 DIAGNOSIS — I10 Essential (primary) hypertension: Secondary | ICD-10-CM | POA: Insufficient documentation

## 2011-07-13 DIAGNOSIS — N5089 Other specified disorders of the male genital organs: Secondary | ICD-10-CM

## 2011-07-13 DIAGNOSIS — Z992 Dependence on renal dialysis: Secondary | ICD-10-CM

## 2011-07-13 HISTORY — DX: Heart failure, unspecified: I50.9

## 2011-07-13 HISTORY — DX: Other secondary pulmonary hypertension: I27.29

## 2011-07-13 HISTORY — DX: Other specified disorders of the male genital organs: N50.89

## 2011-07-13 HISTORY — DX: End stage renal disease: Z99.2

## 2011-07-13 HISTORY — DX: Anemia in other chronic diseases classified elsewhere: D63.8

## 2011-07-13 HISTORY — DX: End stage renal disease: N18.6

## 2011-07-13 NOTE — Assessment & Plan Note (Signed)
Right testicle swollen - unknown etiology.  Patient asymptomatic.  Will observe for now.  Follow up in 1 month.  If testicle continues to be swollen, may consider ultrasound.

## 2011-07-13 NOTE — Assessment & Plan Note (Signed)
BP elevated today 180/98.  Anti-hypertensive medications are filled by cardiologist.  Advised patient to follow up with cardiologist for medication refills/adjustment.  Follow up in 1 month.

## 2011-07-13 NOTE — Progress Notes (Signed)
  Subjective:    Patient ID: [name], male    DOB: [birth year], 40 y.o.   MRN: [id number]  HPI  Patient is here to establish new MD after being a patient on [facility].  Patient was hospitalized for flash pulmonary edema.  Cardiology and Nephrology were consulted in the hospital.  Patient currently is followed by Dr. Marlou Porch (Cardiology) and nephrologist at [facility].  Patient doing well today.  He denies any CP, dyspnea/SOB, peripheral edema, cough.  Denies any fever, chills, night sweats, abdominal pain.  He goes to HD every MWF.    Patient concerned about swollen testicle.  Started 1-2 months ago.  Not painful, no redness/erythema.  Denies any difficulty urinating, discharge, burning with urination.  Review of Systems  Per HPI    Objective:   Physical Exam  Constitutional: No distress.  Eyes: EOM are normal. Pupils are equal, round, and reactive to light.  Neck: Neck supple. No JVD present.  Cardiovascular: Regular rhythm.   Murmur heard. Pulmonary/Chest: Effort normal and breath sounds normal. He has no wheezes. He has no rales.  Genitourinary:       right testicle significantly more swollen than left, but no erythema, tenderness, rash  Lymphadenopathy:    He has no cervical adenopathy.  Psychiatric: He exhibits a depressed mood.          Assessment & Plan:

## 2011-08-02 LAB — COMPREHENSIVE METABOLIC PANEL
ALT: 17
AST: 13
Albumin: 3.4 — ABNORMAL LOW
Calcium: 8.6
Creatinine, Ser: 14.18 — ABNORMAL HIGH
GFR calc Af Amer: 5 — ABNORMAL LOW
Sodium: 137
Total Protein: 7

## 2011-08-02 LAB — CBC
Hemoglobin: 13
MCHC: 33.2
MCHC: 34
MCV: 94.3
MCV: 95.2
Platelets: 131 — ABNORMAL LOW
RBC: 3.81 — ABNORMAL LOW
RBC: 4.14 — ABNORMAL LOW
RDW: 18.9 — ABNORMAL HIGH

## 2011-08-02 LAB — DIFFERENTIAL
Basophils Relative: 0
Lymphs Abs: 1.1
Monocytes Absolute: 0.6
Monocytes Relative: 10
Neutro Abs: 4.6

## 2011-08-02 LAB — APTT: aPTT: 33

## 2011-08-02 LAB — POCT I-STAT, CHEM 8
Creatinine, Ser: 11.9 — ABNORMAL HIGH
HCT: 44
Hemoglobin: 15
Potassium: 4.7
Sodium: 138

## 2011-08-02 LAB — CULTURE, BLOOD (ROUTINE X 2)

## 2011-08-09 ENCOUNTER — Encounter: Payer: Self-pay | Admitting: Family Medicine

## 2011-08-09 NOTE — Telephone Encounter (Signed)
This encounter was created in error - please disregard.

## 2011-08-09 NOTE — Telephone Encounter (Signed)
Error

## 2011-08-12 LAB — CK TOTAL AND CKMB (NOT AT ARMC): Relative Index: 0.7

## 2011-08-12 LAB — COMPREHENSIVE METABOLIC PANEL
ALT: 24
AST: 11
Albumin: 2.8 — ABNORMAL LOW
Albumin: 2.9 — ABNORMAL LOW
Alkaline Phosphatase: 66
BUN: 40 — ABNORMAL HIGH
CO2: 28
CO2: 30
Chloride: 101
Chloride: 101
Creatinine, Ser: 14.3 — ABNORMAL HIGH
Creatinine, Ser: 15.21 — ABNORMAL HIGH
GFR calc Af Amer: 4 — ABNORMAL LOW
GFR calc non Af Amer: 4 — ABNORMAL LOW
GFR calc non Af Amer: 4 — ABNORMAL LOW
Glucose, Bld: 107 — ABNORMAL HIGH
Potassium: 4.7
Potassium: 6.5
Sodium: 141
Total Bilirubin: 0.6
Total Bilirubin: 0.7

## 2011-08-12 LAB — I-STAT 8, (EC8 V) (CONVERTED LAB)
Acid-Base Excess: 3 — ABNORMAL HIGH
Chloride: 104
HCT: 45
Operator id: 151321
Potassium: 5.3 — ABNORMAL HIGH
TCO2: 32
pH, Ven: 7.368 — ABNORMAL HIGH

## 2011-08-12 LAB — CBC
HCT: 73.1 — ABNORMAL HIGH
Hemoglobin: 13.3
MCHC: 18.2 — ABNORMAL LOW
Platelets: 369
RDW: 17.7 — ABNORMAL HIGH

## 2011-08-12 LAB — CARDIAC PANEL(CRET KIN+CKTOT+MB+TROPI)
Relative Index: 0.8
Relative Index: 0.9
Total CK: 234 — ABNORMAL HIGH
Troponin I: 0.03

## 2011-08-12 LAB — APTT: aPTT: 34

## 2011-08-12 LAB — LIPID PANEL
Triglycerides: 60
VLDL: 12

## 2011-08-12 LAB — PROTIME-INR
INR: 1.2
Prothrombin Time: 15.3 — ABNORMAL HIGH

## 2011-08-12 LAB — POCT CARDIAC MARKERS
CKMB, poc: 1
Operator id: 151321
Troponin i, poc: <0.05

## 2011-08-12 LAB — B-NATRIURETIC PEPTIDE (CONVERTED LAB): Pro B Natriuretic peptide (BNP): 4811 — ABNORMAL HIGH

## 2011-08-18 ENCOUNTER — Ambulatory Visit (INDEPENDENT_AMBULATORY_CARE_PROVIDER_SITE_OTHER): Payer: Medicare Other | Admitting: Family Medicine

## 2011-08-18 ENCOUNTER — Encounter: Payer: Self-pay | Admitting: Family Medicine

## 2011-08-18 DIAGNOSIS — Z992 Dependence on renal dialysis: Secondary | ICD-10-CM

## 2011-08-18 DIAGNOSIS — I10 Essential (primary) hypertension: Secondary | ICD-10-CM

## 2011-08-18 DIAGNOSIS — N186 End stage renal disease: Secondary | ICD-10-CM

## 2011-08-18 DIAGNOSIS — N5089 Other specified disorders of the male genital organs: Secondary | ICD-10-CM

## 2011-08-18 DIAGNOSIS — F3181 Bipolar II disorder: Secondary | ICD-10-CM | POA: Insufficient documentation

## 2011-08-18 DIAGNOSIS — F3189 Other bipolar disorder: Secondary | ICD-10-CM

## 2011-08-18 HISTORY — DX: Bipolar II disorder: F31.81

## 2011-08-18 LAB — BASIC METABOLIC PANEL
BUN: 29 — ABNORMAL HIGH
CO2: 33 — ABNORMAL HIGH
Calcium: 8.9
Chloride: 98
Creatinine, Ser: 12.35 — ABNORMAL HIGH
GFR calc Af Amer: 6 — ABNORMAL LOW

## 2011-08-18 MED ORDER — CLONAZEPAM 1 MG PO TABS: 1.0000 mg | ORAL_TABLET | Freq: Every day | ORAL | Status: DC | PRN

## 2011-08-18 MED ORDER — ARIPIPRAZOLE 2 MG PO TABS: 2.0000 mg | ORAL_TABLET | Freq: Every day | ORAL | Status: DC

## 2011-08-18 NOTE — Patient Instructions (Signed)
It was great to see you again, [name]. I will give you Rx for Klonopin to be taken prior to dialysis on M, W, and F. After you have seen a psychiatrist, he/she will need to refill both Klonopin and Abilify. Please call your urologist to see if you can schedule an appointment before December. You may schedule a follow up appointment with me in 3 months or sooner if needed. Glad you are doing well, Dr. Francesco Sor

## 2011-08-18 NOTE — Assessment & Plan Note (Signed)
BP slightly elevated - 149/84.  Will continue current regimen.  Cardiology following.

## 2011-08-18 NOTE — Progress Notes (Signed)
  Subjective:    Patient ID: [name], male    DOB: 10-19-1971, 40 y.o.   MRN: [id number]  HPI  Patient presents to clinic for Rx Abilify and Klonopin.  Patient was a patient at [facility] where he met with a NP for counseling for PTSD.  [facility] has referred patient to [facility] to be evaluated by psychiatrist.  Patient has run out of Abilify and Ativan and needs enough tablets to last until his initial Psych appointment on 08/30/11.  He takes Ativan prior to HD on Mon, Wed, and Friday.  He takes Ativan 2 mg, but says it is not working anymore and he gets very agitated in the middle of HD.  He would like to try Klonopin instead.  Regarding Abilify, he ran out of it a few days ago and has had episodes of irritability, lack of focus, and anxiety.  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  Patient also complains of right testicle swelling.  He sees a urologist who said the swelling was likely secondary to trauma from prior hospitalization.  Urology will continue to monitor swelling and follow up with patient in December.  Patient denies any testicular pain.  He does not urinate.  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  Patient does not wish to have GU examination today.  Will wait to be seen by Urology.  Past medical history:I have reviewed and confirmed the past medical history in the chart. Medications: reviewed medication list in the chart Allergies: reviewed allergy section in the chart  Review of Systems  Denies any fever, chills, NS, chest pain, SOB, or peripheral edema.      Objective:   Physical Exam  Constitutional: No distress.  HENT:  Mouth/Throat: Oropharynx is clear and moist.  Cardiovascular: Normal rate and regular rhythm.   Murmur heard. Pulmonary/Chest: Effort normal and breath sounds normal. He has no wheezes. He has no rales.  Musculoskeletal: Normal range  of motion. He exhibits no edema.  Psychiatric: His speech is normal. Judgment and thought content normal. His affect is blunt. He is withdrawn. He exhibits a depressed mood. He expresses no homicidal and no suicidal ideation.          Assessment & Plan:

## 2011-08-18 NOTE — Assessment & Plan Note (Signed)
Will give 12 day course of Abilify with no refills.  He has an appointment with [facility] on 08/30/11.  Discussed with patient that I will not refill Psych medications after he is seen by [facility].  He will need to get both Abilify and Clonazepam from psychiatrist.  I will need to obtain records from [facility].  Patient agreed with plan.

## 2011-08-18 NOTE — Assessment & Plan Note (Signed)
Right testicle swollen - unknown etiology.  Urology following.  Encouraged patient to follow up with Urology sooner than later.  If he develops any testicular pain, redness, or signs of infection, patient to call MD or go to ED.  I offered to do a GU exam with a male preceptor, but patient declined.  He will wait to be seen by Urology in December.

## 2011-08-18 NOTE — Assessment & Plan Note (Signed)
Continue HD at [facility] on MWF.  Will give 12 day course of Clonazepam to be taken prior to HD.  Discussed with patient that once he is seen at [facility], his benzos will need to be filled by psychiatrist and not myself.  He agrees/understands plan.

## 2011-10-31 ENCOUNTER — Emergency Department (HOSPITAL_COMMUNITY)
Admission: EM | Admit: 2011-10-31 | Discharge: 2011-10-31 | Disposition: A | Discharge status: Home / Self Care | Payer: Medicare Other | Attending: Emergency Medicine | Admitting: Emergency Medicine

## 2011-10-31 ENCOUNTER — Encounter (HOSPITAL_COMMUNITY): Payer: Self-pay | Admitting: Emergency Medicine

## 2011-10-31 ENCOUNTER — Other Ambulatory Visit: Payer: Self-pay

## 2011-10-31 DIAGNOSIS — E1169 Type 2 diabetes mellitus with other specified complication: Secondary | ICD-10-CM | POA: Insufficient documentation

## 2011-10-31 DIAGNOSIS — I509 Heart failure, unspecified: Secondary | ICD-10-CM | POA: Insufficient documentation

## 2011-10-31 DIAGNOSIS — F329 Major depressive disorder, single episode, unspecified: Secondary | ICD-10-CM | POA: Insufficient documentation

## 2011-10-31 DIAGNOSIS — F3289 Other specified depressive episodes: Secondary | ICD-10-CM | POA: Insufficient documentation

## 2011-10-31 DIAGNOSIS — R61 Generalized hyperhidrosis: Secondary | ICD-10-CM | POA: Insufficient documentation

## 2011-10-31 DIAGNOSIS — R197 Diarrhea, unspecified: Secondary | ICD-10-CM | POA: Insufficient documentation

## 2011-10-31 DIAGNOSIS — I10 Essential (primary) hypertension: Secondary | ICD-10-CM | POA: Insufficient documentation

## 2011-10-31 DIAGNOSIS — Z79899 Other long term (current) drug therapy: Secondary | ICD-10-CM | POA: Insufficient documentation

## 2011-10-31 LAB — OCCULT BLOOD, POC DEVICE: Fecal Occult Bld: NEGATIVE

## 2011-10-31 LAB — CBC
MCH: 31.7 pg (ref 26.0–34.0)
MCV: 98 fL (ref 78.0–100.0)
Platelets: 130 10*3/uL — ABNORMAL LOW (ref 150–400)
RDW: 13 % (ref 11.5–15.5)
WBC: 5.1 10*3/uL (ref 4.0–10.5)

## 2011-10-31 LAB — DIFFERENTIAL
Basophils Absolute: 0 10*3/uL (ref 0.0–0.1)
Eosinophils Absolute: 0.7 10*3/uL (ref 0.0–0.7)
Eosinophils Relative: 13 % — ABNORMAL HIGH (ref 0–5)
Neutrophils Relative %: 54 % (ref 43–77)

## 2011-10-31 LAB — COMPREHENSIVE METABOLIC PANEL
ALT: 23 U/L (ref 0–53)
AST: 16 U/L (ref 0–37)
Albumin: 4.1 g/dL (ref 3.5–5.2)
Calcium: 9.4 mg/dL (ref 8.4–10.5)
GFR calc Af Amer: 6 mL/min — ABNORMAL LOW (ref >90)
Potassium: 4.6 mEq/L (ref 3.5–5.1)
Sodium: 140 mEq/L (ref 135–145)
Total Protein: 9 g/dL — ABNORMAL HIGH (ref 6.0–8.3)

## 2011-10-31 MED ORDER — LOPERAMIDE HCL 2 MG PO CAPS: 2.0000 mg | ORAL_CAPSULE | Freq: Four times a day (QID) | ORAL | Status: AC | PRN

## 2011-10-31 MED ORDER — ONDANSETRON 8 MG PO TBDP: 8.0000 mg | ORAL_TABLET | Freq: Three times a day (TID) | ORAL | Status: AC | PRN

## 2011-10-31 MED ORDER — ONDANSETRON HCL 4 MG/2ML IJ SOLN
4.0000 mg | Freq: Once | INTRAMUSCULAR | Status: AC
  Administered 2011-10-31: 4 mg via INTRAVENOUS
  Filled 2011-10-31: qty 2

## 2011-10-31 NOTE — ED Notes (Signed)
Pt in bathroom

## 2011-10-31 NOTE — ED Notes (Addendum)
Pt reports that his stool is now loose and that the last time he went the water was pink.  MD advised

## 2011-10-31 NOTE — ED Notes (Signed)
MD at bedside. 

## 2011-10-31 NOTE — ED Notes (Signed)
EMS reports pt called c/o abdominal pain.  Last bm was 1 hour ago.   Pt did vomit at 9am also. Dialysis pt and received dialysis yesterday.  Allergy to nitrates.  History of hypertension and renal disease. EMS put pt on 4lt o2 because he became diaphoretic in route.  No other complaints other than abdominal pain and feeling the need to have bowel movement.

## 2011-10-31 NOTE — ED Provider Notes (Signed)
History     CSN: [id number]  Arrival date & time 10/31/11  1000   First MD Initiated Contact with Patient 10/31/11 1010      Chief Complaint  Patient presents with  . Abdominal Pain   HPI Pt states he got real hot and felt like his blood sugar was dropping.  He drank some juice and had a biscuit.  He started to feel that way again at home and had his sugar checked at the fire department and it was 76.  Pt got flushed again anf felt as if he had to have a bowel movement.  He also vomited.  He came to the ED and had a bowel movement and feels better.  NO blood in stool.  No CP, no Dyspena.  No abd pain.  The symptoms have all resolved. Pt last had dialysis yesterday without difficulty. Past Medical History  Diagnosis Date  . Renal insufficiency   . Hypertension   . Cancer   . Anemia   . Depression   . Blood transfusion   . CHF (congestive heart failure)     Past Surgical History  Procedure Date  . Coronary artery bypass graft   . Cholecystectomy     Family History  Problem Relation Age of Onset  . Arthritis Mother   . Hypertension Mother   . Diabetes Mother   . Arthritis Father   . Hypertension Father   . Diabetes Father     History  Substance Use Topics  . Smoking status: Never Smoker   . Smokeless tobacco: Not on file  . Alcohol Use: 0.5 oz/week    1 drink(s) per week     drinks liquor once a month      Review of Systems  All other systems reviewed and are negative.    Allergies  Del[place]ort; Food; and Nitrates, organic  Home Medications   Current Outpatient Rx  Name Route Sig Dispense Refill  . AMLODIPINE BESYLATE 5 MG PO TABS Oral Take 5 mg by mouth 2 (two) times daily.      Marland Kitchen CARVEDILOL 25 MG PO TABS Oral Take 50 mg by mouth 2 (two) times daily with a meal.      . CINACALCET HCL 30 MG PO TABS Oral Take 30 mg by mouth daily.      Marland Kitchen CLONIDINE HCL 0.2 MG PO TABS Oral Take 0.2 mg by mouth 2 (two) times daily.      Marland Kitchen LANTHANUM CARBONATE 1000 MG PO CHEW  Oral Chew 1,000 mg by mouth 3 (three) times daily with meals.      Marland Kitchen LISINOPRIL 20 MG PO TABS Oral Take 20 mg by mouth daily.      Marland Kitchen RANITIDINE HCL 150 MG PO CAPS Oral Take 150 mg by mouth every evening.      . ECONAZOLE NITRATE 1 % EX CREA Topical Apply topically daily.      Marland Kitchen VARDENAFIL HCL 20 MG PO TABS Oral Take 20 mg by mouth daily as needed.        There were no vitals taken for this visit.  Physical Exam  Nursing note and vitals reviewed. Constitutional: He is oriented to person, place, and time. He appears well-developed and well-nourished. No distress.  HENT:  Head: Normocephalic and atraumatic.  Right Ear: External ear normal.  Left Ear: External ear normal.  Eyes: Conjunctivae are normal. Right eye exhibits no discharge. Left eye exhibits no discharge. No scleral icterus.  Neck: Neck supple. No tracheal  deviation present.  Cardiovascular: Normal rate, regular rhythm and intact distal pulses.   Pulmonary/Chest: Effort normal and breath sounds normal. No stridor. No respiratory distress. He has no wheezes. He has no rales.  Abdominal: Soft. Bowel sounds are normal. He exhibits no distension. There is no tenderness. There is no rebound and no guarding.  Genitourinary: Rectum normal. Guaiac negative stool.  Musculoskeletal: He exhibits no edema and no tenderness.       Av fistula left arm  Neurological: He is alert and oriented to person, place, and time. He has normal strength. No sensory deficit. Cranial nerve deficit:  no gross defecits noted. He exhibits normal muscle tone. He displays no seizure activity. Coordination normal.  Skin: Skin is warm and dry. No rash noted.  Psychiatric: He has a normal mood and affect.    ED Course  Procedures (including critical care time)  Date: 10/31/2011  Rate: 63  Rhythm: normal sinus rhythm  QRS Axis: normal  Intervals: normal  ST/T Wave abnormalities: normal  Conduction Disutrbances:first degree av block  Narrative Interpretation:  left atrial abnormality, lvh  Old EKG Reviewed: none available   Labs Reviewed  CBC - Abnormal; Notable for the following:    Platelets 130 (*)    All other components within normal limits  DIFFERENTIAL - Abnormal; Notable for the following:    Eosinophils Relative 13 (*)    All other components within normal limits  COMPREHENSIVE METABOLIC PANEL - Abnormal; Notable for the following:    BUN 38 (*)    Creatinine, Ser 10.46 (*)    Total Protein 9.0 (*)    Alkaline Phosphatase 135 (*)    GFR calc non Af Amer 5 (*)    GFR calc Af Amer 6 (*)    All other components within normal limits  LIPASE, BLOOD - Abnormal; Notable for the following:    Lipase 110 (*)    All other components within normal limits  POCT OCCULT BLOOD STOOL, DEVICE   Diagnosis: Diarrhea  MDM  Patient denies any abdominal pain. He has been having a few episodes of diarrhea now however. He does have a slight increase in his lipase but he is not having any epigastric abdominal tenderness. I think this can be rechecked as an outpatient. Patient thought he noticed some blood in the stool. I performed a rectal exam it did not see any gross blood Hemoccult test was sent off. Patient had noted that he initially felt much better after having a bowel movement. I suspect his diaphoresis was related to a vagal episode. He does not appear to be anemic. There is no evidence of hypoglycemia. This point feel the patient is stable to be discharged home. I will prescribe him medications for nausea and diarrhea as this could be the early symptoms associated with the viral GI illness.        Kathalene Frames, MD 10/31/11 1254

## 2011-11-01 ENCOUNTER — Encounter (HOSPITAL_COMMUNITY): Payer: Self-pay

## 2011-11-01 ENCOUNTER — Observation Stay (HOSPITAL_COMMUNITY)
Admission: EM | Admit: 2011-11-01 | Discharge: 2011-11-02 | Disposition: A | Discharge status: Home / Self Care | Payer: Medicare Other | Attending: Family Medicine | Admitting: Family Medicine

## 2011-11-01 DIAGNOSIS — D638 Anemia in other chronic diseases classified elsewhere: Secondary | ICD-10-CM | POA: Insufficient documentation

## 2011-11-01 DIAGNOSIS — K922 Gastrointestinal hemorrhage, unspecified: Secondary | ICD-10-CM

## 2011-11-01 DIAGNOSIS — F3189 Other bipolar disorder: Secondary | ICD-10-CM | POA: Insufficient documentation

## 2011-11-01 DIAGNOSIS — N039 Chronic nephritic syndrome with unspecified morphologic changes: Secondary | ICD-10-CM | POA: Insufficient documentation

## 2011-11-01 DIAGNOSIS — D649 Anemia, unspecified: Secondary | ICD-10-CM

## 2011-11-01 DIAGNOSIS — I10 Essential (primary) hypertension: Secondary | ICD-10-CM | POA: Insufficient documentation

## 2011-11-01 DIAGNOSIS — D631 Anemia in chronic kidney disease: Secondary | ICD-10-CM | POA: Insufficient documentation

## 2011-11-01 DIAGNOSIS — N186 End stage renal disease: Secondary | ICD-10-CM | POA: Insufficient documentation

## 2011-11-01 DIAGNOSIS — D62 Acute posthemorrhagic anemia: Secondary | ICD-10-CM | POA: Insufficient documentation

## 2011-11-01 DIAGNOSIS — I120 Hypertensive chronic kidney disease with stage 5 chronic kidney disease or end stage renal disease: Secondary | ICD-10-CM | POA: Insufficient documentation

## 2011-11-01 DIAGNOSIS — K921 Melena: Principal | ICD-10-CM | POA: Insufficient documentation

## 2011-11-01 DIAGNOSIS — Z992 Dependence on renal dialysis: Secondary | ICD-10-CM | POA: Insufficient documentation

## 2011-11-01 DIAGNOSIS — I509 Heart failure, unspecified: Secondary | ICD-10-CM | POA: Insufficient documentation

## 2011-11-01 DIAGNOSIS — F3181 Bipolar II disorder: Secondary | ICD-10-CM | POA: Insufficient documentation

## 2011-11-01 HISTORY — DX: Gastrointestinal hemorrhage, unspecified: K92.2

## 2011-11-01 LAB — COMPREHENSIVE METABOLIC PANEL
ALT: 15 U/L (ref 0–53)
AST: 7 U/L (ref 0–37)
Albumin: 3.4 g/dL — ABNORMAL LOW (ref 3.5–5.2)
CO2: 27 mEq/L (ref 19–32)
Calcium: 9.3 mg/dL (ref 8.4–10.5)
Creatinine, Ser: 14.2 mg/dL — ABNORMAL HIGH (ref 0.50–1.35)
GFR calc non Af Amer: 4 mL/min — ABNORMAL LOW (ref >90)
Sodium: 139 mEq/L (ref 135–145)
Total Protein: 7.5 g/dL (ref 6.0–8.3)

## 2011-11-01 LAB — CARDIAC PANEL(CRET KIN+CKTOT+MB+TROPI)
CK, MB: 4 ng/mL (ref 0.3–4.0)
Relative Index: 1.2 (ref 0.0–2.5)
Total CK: 336 U/L — ABNORMAL HIGH (ref 7–232)
Troponin I: <0.3 ng/mL (ref <0.30)

## 2011-11-01 LAB — OCCULT BLOOD, POC DEVICE: Fecal Occult Bld: POSITIVE

## 2011-11-01 LAB — CBC
HCT: 36 % — ABNORMAL LOW (ref 39.0–52.0)
Hemoglobin: 11.1 g/dL — ABNORMAL LOW (ref 13.0–17.0)
Hemoglobin: 11.8 g/dL — ABNORMAL LOW (ref 13.0–17.0)
MCHC: 32.2 g/dL (ref 30.0–36.0)
MCV: 95.7 fL (ref 78.0–100.0)
RBC: 3.76 MIL/uL — ABNORMAL LOW (ref 4.22–5.81)
RDW: 13 % (ref 11.5–15.5)
WBC: 5.3 10*3/uL (ref 4.0–10.5)

## 2011-11-01 LAB — PROTIME-INR: INR: 1.25 (ref 0.00–1.49)

## 2011-11-01 LAB — MRSA PCR SCREENING: MRSA by PCR: NEGATIVE

## 2011-11-01 LAB — PHOSPHORUS: Phosphorus: 7.5 mg/dL — ABNORMAL HIGH (ref 2.3–4.6)

## 2011-11-01 LAB — LIPASE, BLOOD: Lipase: 98 U/L — ABNORMAL HIGH (ref 11–59)

## 2011-11-01 MED ORDER — NEPRO/CARBSTEADY PO LIQD
237.0000 mL | ORAL | Status: DC | PRN
  Filled 2011-11-01: qty 237

## 2011-11-01 MED ORDER — AMLODIPINE BESYLATE 5 MG PO TABS
5.0000 mg | ORAL_TABLET | Freq: Two times a day (BID) | ORAL | Status: DC
  Administered 2011-11-01 – 2011-11-02 (×2): 5 mg via ORAL
  Filled 2011-11-01 (×3): qty 1

## 2011-11-01 MED ORDER — ACETAMINOPHEN 325 MG PO TABS: 650.0000 mg | ORAL_TABLET | Freq: Four times a day (QID) | ORAL | Status: DC | PRN

## 2011-11-01 MED ORDER — SODIUM CHLORIDE 0.9 % IV SOLN: 100.0000 mL | INTRAVENOUS | Status: DC | PRN

## 2011-11-01 MED ORDER — CLONIDINE HCL 0.2 MG PO TABS
0.2000 mg | ORAL_TABLET | Freq: Two times a day (BID) | ORAL | Status: DC
  Administered 2011-11-01 – 2011-11-02 (×2): 0.2 mg via ORAL
  Filled 2011-11-01 (×4): qty 1

## 2011-11-01 MED ORDER — FAMOTIDINE 20 MG PO TABS
20.0000 mg | ORAL_TABLET | Freq: Every day | ORAL | Status: DC
  Administered 2011-11-01: 20 mg via ORAL
  Filled 2011-11-01 (×2): qty 1

## 2011-11-01 MED ORDER — LIDOCAINE-PRILOCAINE 2.5-2.5 % EX CREA
1.0000 "application " | TOPICAL_CREAM | CUTANEOUS | Status: DC | PRN
  Filled 2011-11-01: qty 5

## 2011-11-01 MED ORDER — SODIUM CHLORIDE 0.9 % IV SOLN: INTRAVENOUS | Status: DC

## 2011-11-01 MED ORDER — LANTHANUM CARBONATE 500 MG PO CHEW: 1000.0000 mg | CHEWABLE_TABLET | Freq: Three times a day (TID) | ORAL | Status: DC

## 2011-11-01 MED ORDER — LISINOPRIL 20 MG PO TABS: 20.0000 mg | ORAL_TABLET | Freq: Every day | ORAL | Status: DC

## 2011-11-01 MED ORDER — LISINOPRIL 20 MG PO TABS
20.0000 mg | ORAL_TABLET | Freq: Every day | ORAL | Status: DC
  Administered 2011-11-01: 20 mg via ORAL
  Filled 2011-11-01 (×2): qty 1

## 2011-11-01 MED ORDER — LANTHANUM CARBONATE 500 MG PO CHEW
1000.0000 mg | CHEWABLE_TABLET | Freq: Three times a day (TID) | ORAL | Status: DC
  Administered 2011-11-01: 1000 mg via ORAL
  Filled 2011-11-01 (×5): qty 2

## 2011-11-01 MED ORDER — CARVEDILOL 25 MG PO TABS
50.0000 mg | ORAL_TABLET | Freq: Two times a day (BID) | ORAL | Status: DC
  Administered 2011-11-01 – 2011-11-02 (×2): 50 mg via ORAL
  Filled 2011-11-01 (×4): qty 2

## 2011-11-01 MED ORDER — HEPARIN SODIUM (PORCINE) 1000 UNIT/ML DIALYSIS: 1000.0000 [IU] | INTRAMUSCULAR | Status: DC | PRN

## 2011-11-01 MED ORDER — CLONAZEPAM 0.5 MG PO TABS
2.0000 mg | ORAL_TABLET | Freq: Every day | ORAL | Status: DC | PRN
  Administered 2011-11-02: 2 mg via ORAL
  Filled 2011-11-01: qty 3
  Filled 2011-11-01: qty 1

## 2011-11-01 MED ORDER — CINACALCET HCL 30 MG PO TABS
30.0000 mg | ORAL_TABLET | Freq: Every day | ORAL | Status: DC
  Administered 2011-11-01: 30 mg via ORAL
  Filled 2011-11-01 (×2): qty 1

## 2011-11-01 MED ORDER — PANTOPRAZOLE SODIUM 40 MG PO TBEC
40.0000 mg | DELAYED_RELEASE_TABLET | Freq: Every day | ORAL | Status: DC
  Administered 2011-11-01: 40 mg via ORAL

## 2011-11-01 MED ORDER — LIDOCAINE HCL (PF) 1 % IJ SOLN: 5.0000 mL | INTRAMUSCULAR | Status: DC | PRN

## 2011-11-01 MED ORDER — ONDANSETRON HCL 4 MG/2ML IJ SOLN: 4.0000 mg | Freq: Four times a day (QID) | INTRAMUSCULAR | Status: DC | PRN

## 2011-11-01 MED ORDER — ACETAMINOPHEN 650 MG RE SUPP: 650.0000 mg | Freq: Four times a day (QID) | RECTAL | Status: DC | PRN

## 2011-11-01 MED ORDER — PANTOPRAZOLE SODIUM 40 MG IV SOLR
40.0000 mg | Freq: Two times a day (BID) | INTRAVENOUS | Status: DC
  Filled 2011-11-01 (×2): qty 40

## 2011-11-01 MED ORDER — ALTEPLASE 2 MG IJ SOLR
2.0000 mg | Freq: Once | INTRAMUSCULAR | Status: AC | PRN
  Filled 2011-11-01: qty 2

## 2011-11-01 MED ORDER — AMLODIPINE BESYLATE 10 MG PO TABS
10.0000 mg | ORAL_TABLET | Freq: Every day | ORAL | Status: DC
  Filled 2011-11-01: qty 1

## 2011-11-01 MED ORDER — PENTAFLUOROPROP-TETRAFLUOROETH EX AERO: 1.0000 "application " | INHALATION_SPRAY | CUTANEOUS | Status: DC | PRN

## 2011-11-01 MED ORDER — ONDANSETRON HCL 4 MG PO TABS: 4.0000 mg | ORAL_TABLET | Freq: Four times a day (QID) | ORAL | Status: DC | PRN

## 2011-11-01 MED ORDER — RENA-VITE PO TABS
1.0000 | ORAL_TABLET | Freq: Every day | ORAL | Status: DC
  Filled 2011-11-01: qty 1

## 2011-11-01 NOTE — ED Notes (Signed)
Pt states that he is still passing blood. Pt states that he was told that if he was no better to come back.

## 2011-11-01 NOTE — ED Provider Notes (Signed)
History     CSN: [id number]  Arrival date & time 11/01/11  [id number]   First MD Initiated Contact with Patient 11/01/11 [phone]      Chief Complaint  Patient presents with  . Rectal Bleeding    (Consider location/radiation/quality/duration/timing/severity/associated sxs/prior treatment) HPI Comments: Patient returns to ED today out of concern for his rectal bleeding.  States he started having rectal bleeding in the ED yesterday.  Has had one episode last night and one episode this morning.  He estimates that each of these episodes contained approximately 8 oz bright red blood.  Patient was seen in ED yesterday for abdominal pain, N/V/D, that has since resolved.  Was told he could follow up with his PCP, which he states he won't be able to do until tomorrow and he was concerned that wouldn't be soon enough.  Denies fever, lightheadedness/dizziness, shortness of breath, weakness.    Patient is a 40 y.o. male presenting with hematochezia. The history is provided by the patient.  Rectal Bleeding     Past Medical History  Diagnosis Date  . Renal insufficiency   . Hypertension   . Anemia   . Depression   . Blood transfusion   . CHF (congestive heart failure)     Past Surgical History  Procedure Date  . Cholecystectomy   . Angioplasty   . Dialysis fistula creation   . Knee surgery   . Mouth surgery   . Nephrectomy transplanted organ     Family History  Problem Relation Age of Onset  . Arthritis Mother   . Hypertension Mother   . Diabetes Mother   . Arthritis Father   . Hypertension Father   . Diabetes Father     History  Substance Use Topics  . Smoking status: Never Smoker   . Smokeless tobacco: Not on file  . Alcohol Use: 0.5 oz/week    1 drink(s) per week     drinks liquor once a month      Review of Systems  Gastrointestinal: Positive for hematochezia.    Allergies  Delacort; Food; and Nitrates, organic  Home Medications   Current Outpatient Rx  Name  Route Sig Dispense Refill  . AMLODIPINE BESYLATE 5 MG PO TABS Oral Take 5 mg by mouth 2 (two) times daily.      . ASPIRIN 81 MG PO CHEW Oral Chew 81 mg by mouth daily.      Marland Kitchen CARVEDILOL 25 MG PO TABS Oral Take 50 mg by mouth 2 (two) times daily with a meal.      . CINACALCET HCL 30 MG PO TABS Oral Take 30 mg by mouth daily.      Marland Kitchen CLONAZEPAM 2 MG PO TABS Oral Take 2 mg by mouth 2 (two) times daily as needed. For nerves    . CLONIDINE HCL 0.2 MG PO TABS Oral Take 0.2 mg by mouth 2 (two) times daily.      . ECONAZOLE NITRATE 1 % EX CREA Topical Apply topically daily.      Marland Kitchen LANTHANUM CARBONATE 1000 MG PO CHEW Oral Chew 1,000 mg by mouth 3 (three) times daily with meals.      Marland Kitchen LISINOPRIL 20 MG PO TABS Oral Take 20 mg by mouth daily.      Marland Kitchen LOPERAMIDE HCL 2 MG PO CAPS Oral Take 1 capsule (2 mg total) by mouth 4 (four) times daily as needed for diarrhea or loose stools. 12 capsule 0  . ONDANSETRON 8 MG PO TBDP Oral  Take 1 tablet (8 mg total) by mouth every 8 (eight) hours as needed for nausea. 20 tablet 0  . RANITIDINE HCL 150 MG PO CAPS Oral Take 150 mg by mouth every evening.      Marland Kitchen VARDENAFIL HCL 20 MG PO TABS Oral Take 20 mg by mouth daily as needed. For erectile dysfuction      BP 151/92  Pulse 74  Temp(Src) 98.3 F (36.8 C) (Oral)  Resp 16  Ht 6\' 3"  (1.905 m)  Wt 200 lb (90.719 kg)  BMI 25.00 kg/m2  SpO2 98%  Physical Exam  Nursing note and vitals reviewed. Constitutional: He is oriented to person, place, and time. He appears well-developed and well-nourished.  HENT:  Head: Normocephalic and atraumatic.  Neck: Neck supple.  Cardiovascular: Normal rate, regular rhythm and normal heart sounds.   Pulmonary/Chest: Breath sounds normal. No respiratory distress. He has no wheezes. He has no rales. He exhibits no tenderness.  Abdominal: Soft. Bowel sounds are normal. He exhibits no distension and no mass. There is no tenderness. There is no rebound and no guarding.  Genitourinary:  Rectal exam shows anal tone normal. Guaiac positive stool.       Trace amount of frank blood in rectum, no stool.    Neurological: He is alert and oriented to person, place, and time.    ED Course  Procedures (including critical care time)  Labs Reviewed  CBC - Abnormal; Notable for the following:    RBC 3.59 (*)    Hemoglobin 11.1 (*)    HCT 34.5 (*)    Platelets 108 (*)    All other components within normal limits  OCCULT BLOOD, POC DEVICE  POCT OCCULT BLOOD STOOL, DEVICE  COMPREHENSIVE METABOLIC PANEL  MAGNESIUM  PHOSPHORUS  CBC  PROTIME-INR  CARDIAC PANEL(CRET KIN+CKTOT+MB+TROPI)  CARDIAC PANEL(CRET KIN+CKTOT+MB+TROPI)  PRO B NATRIURETIC PEPTIDE  LACTIC ACID, PLASMA  LIPASE, BLOOD  STOOL CULTURE  MRSA PCR SCREENING   No results found.   1. GI bleeding   2. Anemia       MDM  Patient with intermittent rectal bleeding, small amount of frank blood on rectal exam.  No abdominal pain, no fever, VS normal, no weakness/lightheadedness/SOB suggesting need for emergent transfusion.  Hgb decreased from 14.3 to 11.1 overnight.  Pt admitted to [facility].          Otis Brace, [place] 11/01/11 1425

## 2011-11-01 NOTE — ED Notes (Signed)
Call to flow management to inform pt needs Tele bed.

## 2011-11-01 NOTE — Consult Note (Signed)
Reason for Consult:ESRD, Hyper tension, Anemia  Referring Physician: Dr. Lyn Records [name] is an 40 y.o. male.  HPI: who dialyzes MWF at [facility], followed by [place].  Yest had D, abdm cramps, and felt to have enteritis.  Sent home from ER and developed hematochezia so returned. Hb has fallen 14 to 11. Hx FSGS as etio, failed tx 1999 - 2006, HTN, Anemia, HPTH, Bipolar, ? Bladder CA (in chart, he does not know if was told it was gone or not even present).    No N, V, no cough, fevers or chills, rash, itching.  Cramps with xs wgt gain.  No CP, SOB, sores in eyes, dry eyes or dry mouth, or HA. No joint pain or NSAIDS.  Dialyzes at [facility] on MWF since 2006. Primary Nephrologist Dr, [name]. EDW [name]. HD Bath [name], Dialyzer [name], Heparin [name]. Access LLA AVF.  Past Medical History  Diagnosis Date  . Renal insufficiency   . Hypertension   . Anemia   . Depression   . Blood transfusion   . CHF (congestive heart failure)     Past Surgical History  Procedure Date  . Cholecystectomy   . Angioplasty   . Dialysis fistula creation   . Knee surgery   . Mouth surgery   . Nephrectomy transplanted organ     Family History  Problem Relation Age of Onset  . Arthritis Mother   . Hypertension Mother   . Diabetes Mother   . Arthritis Father   . Hypertension Father   . Diabetes Father     Social History:  reports that he has never smoked. He does not have any smokeless tobacco history on file. He reports that he drinks about .5 ounces of alcohol per week. He reports that he does not use illicit drugs.  Allergies:  Allergies  Allergen Reactions  . Delacort (Hydrocortisone Base) Nausea Only  . Food Itching    bananas  . Nitrates, Organic Other (See Comments)    Palpitations, headache    Medications:  I have reviewed the patient's current medications. Prior to Admission:  Prescriptions prior to admission  Medication Sig Dispense Refill  . amLODipine (NORVASC) 5 MG tablet Take 5 mg by mouth 2  (two) times daily.        Marland Kitchen aspirin 81 MG chewable tablet Chew 81 mg by mouth daily.        . carvedilol (COREG) 25 MG tablet Take 50 mg by mouth 2 (two) times daily with a meal.        . cinacalcet (SENSIPAR) 30 MG tablet Take 30 mg by mouth daily.        . clonazePAM (KLONOPIN) 2 MG tablet Take 2 mg by mouth 2 (two) times daily as needed. For nerves      . cloNIDine (CATAPRES) 0.2 MG tablet Take 0.2 mg by mouth 2 (two) times daily.        Marland Kitchen econazole nitrate 1 % cream Apply topically daily.        Marland Kitchen lanthanum (FOSRENOL) 1000 MG chewable tablet Chew 1,000 mg by mouth 3 (three) times daily with meals.        Marland Kitchen lisinopril (PRINIVIL,ZESTRIL) 20 MG tablet Take 20 mg by mouth daily.        Marland Kitchen loperamide (IMODIUM) 2 MG capsule Take 1 capsule (2 mg total) by mouth 4 (four) times daily as needed for diarrhea or loose stools.  12 capsule  0  . ondansetron (ZOFRAN ODT) 8 MG  disintegrating tablet Take 1 tablet (8 mg total) by mouth every 8 (eight) hours as needed for nausea.  20 tablet  0  . ranitidine (ZANTAC) 150 MG capsule Take 150 mg by mouth every evening.        . vardenafil (LEVITRA) 20 MG tablet Take 20 mg by mouth daily as needed. For erectile dysfuction       Scheduled:   . amLODipine  10 mg Oral QHS  . lisinopril  20 mg Oral QHS  . multivitamin  1 tablet Oral Daily    Dialysis meds not known, get records  Results for orders placed during the hospital encounter of 11/01/11 (from the past 48 hour(s))  OCCULT BLOOD, POC DEVICE     Status: Normal   Collection Time   11/01/11  6:56 AM      Component Value Range Comment   Fecal Occult Bld POSITIVE     CBC     Status: Abnormal   Collection Time   11/01/11  7:26 AM      Component Value Range Comment   WBC 5.1  4.0 - 10.5 (K/uL)    RBC 3.59 (*) 4.22 - 5.81 (MIL/uL)    Hemoglobin 11.1 (*) 13.0 - 17.0 (g/dL)    HCT 34.5 (*) 39.0 - 52.0 (%)    MCV 96.1  78.0 - 100.0 (fL)    MCH 30.9  26.0 - 34.0 (pg)    MCHC 32.2  30.0 - 36.0 (g/dL)     RDW 13.0  11.5 - 15.5 (%)    Platelets 108 (*) 150 - 400 (K/uL)     No results found.  @ROS @ Blood pressure 142/87, pulse 64, temperature 98.5 F (36.9 C), temperature source Oral, resp. rate 18, height 6\' 3"  (1.905 m), weight 90.719 kg (200 lb), SpO2 99.00%. @PHYSEXAMBYAGE2 @ Physical Examination: General appearance - alert, well appearing, and in no distress Mental status - alert, oriented to person, place, and time Eyes - funduscopic exam abnormal art narrowing, silver wiring Ears - bilateral TM's and external ear canals normal Mouth - mucous membranes moist, pharynx normal without lesions Lymphatics - posterior cervical nodes Chest - clear to auscultation, no wheezes, rales or rhonchi, symmetric air entry Heart - normal rate, regular rhythm, normal S1, S2, no murmurs, rubs, clicks or gallops, S1 and S2 normal, S4 present, systolic murmur SEM Gr 2/6 at 2nd left intercostal space Abdomen - pos bs, soft, assymmetric, R>L.  Old tx larger than expected, ? Secondary to hernia  Musculoskeletal - no joint tenderness, deformity or swelling Extremities - AVF LFA wit aneurysms Skin - normal coloration and turgor, no rashes, no suspicious skin lesions noted  Assessment/Plan: 1 GIB mild low Hb, avoid Heparin at HD follow hb.  GI eval 2 ESRD: HD in am , no hep, lower vol, lower Bp meds 3 Hypertension: Adjust for HD 4. Anemia of ESRD: Start EPO depending on Hb in am 5. Metabolic Bone Disease: Get records 6 BIPOLAR & ? Bladder CA  P HD, no heparin, adjust diet, adjust meds.  [name] L 11/01/2011, 1:36 PM

## 2011-11-01 NOTE — ED Notes (Signed)
MD at bedside. 

## 2011-11-01 NOTE — ED Notes (Signed)
Pt pulled from Pod A to yellow, report given from chart to 4700, pt declines his IV at this time and floor RN is aware. Plans for dialysis tomorrow

## 2011-11-01 NOTE — H&P (Signed)
[facility] Attending Admission Note: Mark Sorrels MD [phone] pager office [phone] I  have seen and examined this patient, reviewed their chart. I have discussed this patient with the resident. I agree with the resident's findings, assessment and care plan. 

## 2011-11-01 NOTE — ED Notes (Signed)
Pt refusing IV at this time.

## 2011-11-01 NOTE — H&P (Signed)
[name] is an 40 y.o. male.    PCP: DE LA [name],IVY, MD  Chief Complaint: Rectal bleeding  HPI: 40 yo male with hx of CHF, ESRD, anemia of CKD who presents with 24 hours of rectal bleeding. Yesterday he presented to ED for emesis, diarrhea, abdominal pain and dizzyness. He was treated for viral gastroenteritis and dehydration with fluids, zofran and discharged to home. Patient noted a small amount of blood mixed in with his stool at that time, but no gross blood on exam and his Hgb was 14. After discharge, his pain and emesis resolved, but he noted an increased amount of blood with his diarrhea. Had 2 episodes of BRBPR at 1am and then at 5 am today. He did eat some soup yesterday and drank ginger ale with his medications. He presents again this morning due to concern of blood loss and not being able to wait until his outpatient f/u appointment scheduled tomorrow.   ROS: Denies any fever, syncope, SOB, CP, abdominal pain, rash, edema. No family or personal hx of GI diseases, GI bleeding, crohns, or hemorrhoids. He does have constipation intermittently due to his medications.   Normally has HD on M/W/F at [facility] in [place].   Denies any binge drinking or tobacco use.   Past Medical History  Diagnosis Date  . Renal insufficiency   . Hypertension   . Anemia   . Depression   . Blood transfusion   . CHF (congestive heart failure)     Past Surgical History  Procedure Date  . Cholecystectomy   . Angioplasty   . Dialysis fistula creation   . Knee surgery   . Mouth surgery   . Nephrectomy transplanted organ     Family History  Problem Relation Age of Onset  . Arthritis Mother   . Hypertension Mother   . Diabetes Mother   . Arthritis Father   . Hypertension Father   . Diabetes Father    Social History:  reports that he has never smoked. He does not have any smokeless tobacco history on file. He reports that he drinks about .5 ounces of alcohol per week. He reports that he does not  use illicit drugs.  Allergies:  Allergies  Allergen Reactions  . Delacort (Hydrocortisone Base) Nausea Only  . Food Itching    bananas  . Nitrates, Organic Other (See Comments)    Palpitations, headache    Medications Prior to Admission  Medication Dose Route Frequency Provider Last Rate Last Dose  . ondansetron (ZOFRAN) injection 4 mg  4 mg Intravenous Once Kathalene Frames, MD   4 mg at 10/31/11 1120   Medications Prior to Admission  Medication Sig Dispense Refill  . amLODipine (NORVASC) 5 MG tablet Take 5 mg by mouth 2 (two) times daily.        . carvedilol (COREG) 25 MG tablet Take 50 mg by mouth 2 (two) times daily with a meal.        . cinacalcet (SENSIPAR) 30 MG tablet Take 30 mg by mouth daily.        . clonazePAM (KLONOPIN) 2 MG tablet Take 2 mg by mouth 2 (two) times daily as needed. For nerves      . cloNIDine (CATAPRES) 0.2 MG tablet Take 0.2 mg by mouth 2 (two) times daily.        Marland Kitchen econazole nitrate 1 % cream Apply topically daily.        Marland Kitchen lanthanum (FOSRENOL) 1000 MG chewable tablet Chew  1,000 mg by mouth 3 (three) times daily with meals.        Marland Kitchen lisinopril (PRINIVIL,ZESTRIL) 20 MG tablet Take 20 mg by mouth daily.        Marland Kitchen loperamide (IMODIUM) 2 MG capsule Take 1 capsule (2 mg total) by mouth 4 (four) times daily as needed for diarrhea or loose stools.  12 capsule  0  . ondansetron (ZOFRAN ODT) 8 MG disintegrating tablet Take 1 tablet (8 mg total) by mouth every 8 (eight) hours as needed for nausea.  20 tablet  0  . ranitidine (ZANTAC) 150 MG capsule Take 150 mg by mouth every evening.        . vardenafil (LEVITRA) 20 MG tablet Take 20 mg by mouth daily as needed. For erectile dysfuction        Results for orders placed during the hospital encounter of 11/01/11 (from the past 48 hour(s))  OCCULT BLOOD, POC DEVICE     Status: Normal   Collection Time   11/01/11  6:56 AM      Component Value Range Comment   Fecal Occult Bld POSITIVE     CBC     Status: Abnormal    Collection Time   11/01/11  7:26 AM      Component Value Range Comment   WBC 5.1  4.0 - 10.5 (K/uL)    RBC 3.59 (*) 4.22 - 5.81 (MIL/uL)    Hemoglobin 11.1 (*) 13.0 - 17.0 (g/dL)    HCT 34.5 (*) 39.0 - 52.0 (%)    MCV 96.1  78.0 - 100.0 (fL)    MCH 30.9  26.0 - 34.0 (pg)    MCHC 32.2  30.0 - 36.0 (g/dL)    RDW 13.0  11.5 - 15.5 (%)    Platelets 108 (*) 150 - 400 (K/uL)    No results found.  Lab Results  Component Value Date   WBC 5.1 11/01/2011   HGB 11.1* 11/01/2011   HCT 34.5* 11/01/2011   PLT 108* 11/01/2011   GLUCOSE 81 10/31/2011   CHOL  Value: 83        ATP III CLASSIFICATION:  <200     mg/dL   Desirable  200-239  mg/dL   Borderline High  >=240    mg/dL   High 11/04/2007   TRIG 60 11/04/2007   HDL 20* 11/04/2007   LDLCALC  Value: 51        Total Cholesterol/HDL:CHD Risk Coronary Heart Disease Risk Table                     Men   Women  1/2 Average Risk   3.4   3.3 11/04/2007   ALT 23 10/31/2011   AST 16 10/31/2011   NA 140 10/31/2011   K 4.6 10/31/2011   CL 96 10/31/2011   CREATININE 10.46* 10/31/2011   BUN 38* 10/31/2011   CO2 29 10/31/2011        INR 1.3 03/06/2008   Hgb: Results for Mark, [name] (MRN [id number]) as of 11/01/2011 10:40  Ref. Range 02/23/2011 08:26 02/24/2011 05:55 02/25/2011 12:27 10/31/2011 11:14 11/01/2011 07:26  HGB Latest Range: 13.0-17.0 g/dL 10.1 (L) 10.0 (L) 11.0 (L) 14.3 11.1 (L)    ROS  Blood pressure 151/92, pulse 74, temperature 98.3 F (36.8 C), temperature source Oral, resp. rate 16, height 6\' 3"  (1.905 m), weight 200 lb (90.719 kg), SpO2 98.00%. Physical Exam  Vitals reviewed. Constitutional: He is oriented to person, place, and time. He appears  well-developed and well-nourished. No distress.  HENT:  Head: Normocephalic and atraumatic.  Mouth/Throat: Oropharynx is clear and moist. No oropharyngeal exudate.  Eyes: EOM are normal. Pupils are equal, round, and reactive to light.  Cardiovascular: Normal rate, regular rhythm, normal  heart sounds and intact distal pulses.  Exam reveals no gallop.   No murmur heard. Respiratory: Effort normal and breath sounds normal. No respiratory distress. He has no wheezes. He has no rales.  GI: Soft. He exhibits no distension. There is no tenderness. There is no rebound and no guarding.       Hyperactive BS  Musculoskeletal: He exhibits no edema and no tenderness.  Neurological: He is alert and oriented to person, place, and time. Coordination normal.  Skin: Skin is warm and dry. No rash noted. He is not diaphoretic.  Psychiatric: He has a normal mood and affect.     Assessment/Plan 40 yo male with ESRD, CHF and anemia who presents with 24 hours of rectal bleeding with now resolved nausea, emesis.    1. GI bleed/hematochezia. Hemodynamically stable  Associated with now resolved nausea and emesis. Most likely an infectious gastroenteritis, but differential includes internal hemorrhoid, AVM, or ischemic colitis. Will admit to telemetry for observation of further bleeding. None since 5 am today. Check lactic acid, CMET, lipase, stool culture to rule out alternative causes. Continue home H2 blocker and start protonix IV. Clears as tolerated and zofran prn. Will hold home aspirin. Will hold off on calling GI for consult unless bleeding recurs or etiology becomes unclear as this is likely a self-resolving gastroenteritis.   2. Anemia-acute blood loss with chronic disease. Baseline is 10-11 range, but hemoglobin decline documented from 14 yesterday to 11.1 today. Likely secondary to hemodilution with dehydration, because this is observed after fluid hydration in ED yesterday, so not likely a hemodynamically significant change. Will observe overnight on telemetry with history of CHF and possibility of further bleeding. Repeat CBC at 1300 today, q12 hours. According to patient he receives anemia treatment (uncertain name) with HD on wednesdays.   3. ESRD on hemodialysis-MWF HD in [place]/[facility].  WIll notify renal team as patient will likely need routine HD tomorrow. No signs of emergent needs currently.   4. Congestive heart failure (CHF)-no signs of acute exacerbation or fluid overload. Continue home ACEi, BB, CCB. Will stop home ASA with concern for GI bleed.    5. Hypertension-Mildly above goal. Will continue home meds as scheduled. Pt has taken his am doses already today.  6.  Bipolar 2 disorder/anxiety-seems stable. No medication changes. Has prn klonopin taken prior to HD sessions at home. Will continue this.  7. FEN. Will check electrolytes, mg, phos. KVO PIV with NS. Allow clears as patient is hungry and low liklihood of GI procedures currently.   8. Dispo. Pending clinical course, any additional bleeding. Pt is full code.   9. PPX. SCDs for DVT ppx. Protonix daily.    Manjinder Breau 11/01/2011, 9:58 AM

## 2011-11-02 DIAGNOSIS — N186 End stage renal disease: Secondary | ICD-10-CM

## 2011-11-02 DIAGNOSIS — K921 Melena: Secondary | ICD-10-CM

## 2011-11-02 LAB — CARDIAC PANEL(CRET KIN+CKTOT+MB+TROPI)
Relative Index: 1.3 (ref 0.0–2.5)
Relative Index: 1.4 (ref 0.0–2.5)
Total CK: 284 U/L — ABNORMAL HIGH (ref 7–232)
Total CK: 245 U/L — ABNORMAL HIGH (ref 7–232)
Troponin I: <0.3 ng/mL (ref <0.30)

## 2011-11-02 LAB — COMPREHENSIVE METABOLIC PANEL
ALT: 12 U/L (ref 0–53)
AST: 5 U/L (ref 0–37)
Albumin: 3.2 g/dL — ABNORMAL LOW (ref 3.5–5.2)
Alkaline Phosphatase: 89 U/L (ref 39–117)
Chloride: 97 mEq/L (ref 96–112)
Potassium: 5.2 mEq/L — ABNORMAL HIGH (ref 3.5–5.1)
Sodium: 136 mEq/L (ref 135–145)
Total Bilirubin: 0.3 mg/dL (ref 0.3–1.2)
Total Protein: 6.4 g/dL (ref 6.0–8.3)

## 2011-11-02 LAB — CBC
MCHC: 32.6 g/dL (ref 30.0–36.0)
RDW: 12.9 % (ref 11.5–15.5)
WBC: 4.6 10*3/uL (ref 4.0–10.5)

## 2011-11-02 MED ORDER — CLONIDINE HCL 0.1 MG PO TABS
0.1000 mg | ORAL_TABLET | Freq: Two times a day (BID) | ORAL | Status: DC
  Filled 2011-11-02: qty 1

## 2011-11-02 MED ORDER — LANTHANUM CARBONATE 500 MG PO CHEW
2000.0000 mg | CHEWABLE_TABLET | Freq: Three times a day (TID) | ORAL | Status: DC
  Administered 2011-11-02 (×2): 1000 mg via ORAL
  Filled 2011-11-02 (×3): qty 4

## 2011-11-02 NOTE — Progress Notes (Signed)
PGY-1 Daily Progress Note Family Medicine Teaching Service Elizer Bostic M. Yelina Sarratt, MD Service Pager: [phone]  Subjective: Patient currently in dialysis. Denies bleeding or abdominal pain. No other complaints other than feeling tired.  Objective: Vital signs in last 24 hours: Temp:  [97.8 F (36.6 C)-98.5 F (36.9 C)] 97.8 F (36.6 C) (12/26 0519) Pulse Rate:  [63-76] 76  (12/26 0519) Resp:  [18-19] 18  (12/26 0519) BP: (142-163)/(87-97) 148/96 mmHg (12/26 0519) SpO2:  [99 %-100 %] 100 % (12/26 0519) Weight:  [202 lb 6.1 oz (91.8 kg)-203 lb 14.8 oz (92.5 kg)] 203 lb 14.8 oz (92.5 kg) (12/26 0519) Weight change: 2 lb 6.1 oz (1.081 kg) Last BM Date: 11/01/11 (Bloody)  Intake/Output from previous day: 12/25 0701 - 12/26 0700 In: 480 [P.O.:480] Out: 1 [Stool:1] Intake/Output this shift:    General appearance: In dialysis bay. Very sleepy, but awake enough to talk. Resp: clear to auscultation bilaterally Cardio: regular rate and rhythm, S1, S2 normal, no murmur, click, rub or gallop GI: Nontender Extremities: Dialysis via fistula on LUE. Neurologic: Grossly normal, sleepy  Lab Results:  [facility] 11/01/11 1405 11/01/11 0726  WBC 5.3 5.1  HGB 11.8* 11.1*  HCT 36.0* 34.5*  PLT 104* 108*   BMET  Basename 11/01/11 1405 10/31/11 1114  NA 139 140  K 4.5 4.6  CL 96 96  CO2 27 29  GLUCOSE 85 81  BUN 53* 38*  CREATININE 14.20* 10.46*  CALCIUM 9.3 9.4    Studies/Results: No results found.  Medications:  I have reviewed the patient's current medications. Scheduled:   . amLODipine  5 mg Oral BID  . carvedilol  50 mg Oral BID WC  . cinacalcet  30 mg Oral Daily  . cloNIDine  0.2 mg Oral BID  . famotidine  20 mg Oral Daily  . lanthanum  1,000 mg Oral TID WC  . lisinopril  20 mg Oral QHS  . multivitamin  1 tablet Oral Daily  . pantoprazole  40 mg Oral Q1200   Continuous:   . sodium chloride     [id number] chloride, sodium chloride, acetaminophen, acetaminophen,  alteplase, clonazePAM, feeding supplement (NEPRO CARB STEADY), heparin, lidocaine, lidocaine-prilocaine, ondansetron (ZOFRAN) IV, ondansetron, pentafluoroprop-tetrafluoroeth  Assessment/Plan: 40 yo male with ESRD, CHF and anemia who presents with 24 hours of rectal bleeding with now resolved nausea, emesis.   1. GI bleed/hematochezia. Hemodynamically stable, associated with now resolved nausea and emesis. Most likely an infectious gastroenteritis, but differential includes internal hemorrhoid, AVM, or ischemic colitis. >24 hours without bleeding. Doing well on home H2 blocker and Protonix IV. Tolerating full renal diet with no N/V. Will hold off on calling GI for consult unless bleeding recurs or etiology becomes unclear as this is likely a self-resolving gastroenteritis.   2. Anemia-acute blood loss with chronic disease. Baseline is 10-11 range. Acute drop in HgB likely secondary to hemodilution with dehydration, because this is observed after fluid hydration in ED yesterday, so not likely a hemodynamically significant change. According to patient he receives anemia treatment (uncertain name) with HD on wednesdays. HgB remains stable. Patient will have CBC checks at outpatient HD.  3. ESRD on hemodialysis-MWF HD in [place]/[facility]. WIll notify renal team as patient will likely need routine HD tomorrow. No signs of emergent needs currently.   4. Congestive heart failure (CHF)-no signs of acute exacerbation or fluid overload. Continue home ACEi, BB, CCB. Will stop home ASA with concern for GI bleed.   5. Hypertension-Mildly above goal. Will continue home meds  as scheduled. Continue to monitor  6. Bipolar 2 disorder/anxiety-seems stable. No medication changes. Has prn klonopin taken prior to HD sessions at home, which he received today as well.   7. FEN. Renal diet  8. PPX. SCDs for DVT ppx. Protonix daily.  9. Dispo. Patient is stable with no bleeding. Will plan on discharge home later today  after HD is complete.    LOS: 1 day   [facility] 11/02/2011, 7:47 AM

## 2011-11-02 NOTE — Progress Notes (Signed)
Went over all D/C paperwork with pt including follow up appt, meds and GI Bleed teaching sheet and when to notify MD. Pt ate dinner dressed and preferred to walk. Amb to [facility] stay door. Pt says he feels fine has own keys and will be driving self home. Pt has all belongings.        Delray Alt RN

## 2011-11-02 NOTE — Progress Notes (Signed)
Subjective: Interval History: none.  Objective: Vital signs in last 24 hours: Temp:  [97.8 F (36.6 C)-98.5 F (36.9 C)] 98.2 F (36.8 C) (12/26 0853) Pulse Rate:  [63-76] 70  (12/26 0945) Resp:  [13-20] 20  (12/26 0945) BP: (120-163)/(84-100) 128/84 mmHg (12/26 0945) SpO2:  [99 %-100 %] 100 % (12/26 0853) Weight:  [91.8 kg (202 lb 6.1 oz)-93.2 kg (205 lb 7.5 oz)] 205 lb 7.5 oz (93.2 kg) (12/26 0853) Weight change: 1.081 kg (2 lb 6.1 oz)  Intake/Output from previous day: 12/25 0701 - 12/26 0700 In: 480 [P.O.:480] Out: 1 [Stool:1] Intake/Output this shift:    General appearance: alert and cooperative Resp: rales bibasilar Cardio: S1, S2 normal and systolic murmur: holosystolic 2/6, blowing at apex GI: liver down 3 cm, tx old RLQ, ? hernia over tx Extremities: fistula on LLA  Lab Results:  [facility] 11/01/11 1405 11/01/11 0726  WBC 5.3 5.1  HGB 11.8* 11.1*  HCT 36.0* 34.5*  PLT 104* 108*   BMET:  Basename 11/01/11 1405 10/31/11 1114  NA 139 140  K 4.5 4.6  CL 96 96  CO2 27 29  GLUCOSE 85 81  BUN 53* 38*  CREATININE 14.20* 10.46*  CALCIUM 9.3 9.4   No results found for this basename: PTH:2 in the last 72 hours Iron Studies: No results found for this basename: IRON,TIBC,TRANSFERRIN,FERRITIN in the last 72 hours  Studies/Results: No results found.  I have reviewed the patient's current medications.  Assessment/Plan: 1 ESRD for HD 2 HTN can lower meds 3 GIB for eval, hb stable 4 ^ P ^ Binders 5 BIPOLAR  HD, EPO if <11.  Get records.    LOS: 1 day   [name] L 11/02/2011,10:06 AM

## 2011-11-02 NOTE — Discharge Summary (Signed)
Family Medicine Teaching Service  Discharge Note : Attending Sadira Standard MD Pager [phone] Office [phone] I have seen and examined this patient, reviewed their chart and discussed discharge planning wit the resident at the time of discharge. I agree with the discharge plan as above.  

## 2011-11-02 NOTE — Progress Notes (Signed)
FMTS Attending Daily Note: Yajayra Feldt MD [phone] pager office [phone] I have discussed this patient with the resident and reviewed the assessment and plan as documented above. I agree wit the resident's findings and plan.  

## 2011-11-02 NOTE — Discharge Summary (Signed)
Physician Discharge Summary  Patient ID: [name] MRN: [id number] DOB/AGE: December 22, 1970 40 y.o.  Admit date: 11/01/2011 Discharge date: 11/02/2011  Admission Diagnoses:  Discharge Diagnoses:  Principal Problem:  *GI bleed Active Problems:  ESRD on hemodialysis  Congestive heart failure (CHF)  Anemia, chronic disease  Hypertension  Bipolar 2 disorder   Discharged Condition: stable  Hospital Course: 40 yo male with ESRD, CHF and anemia who presented with 24 hours of rectal bleeding as well as resolved nausea/vomiting.   1. GI bleed- Patient remained hemodynamically stable throughout admission with no further episodes of rectal bleeding. He was noted to have an acute drop in his HgB in the ED from 14 to 11, but his baseline is around 11 so perhaps the higher reading was 2/2 to dehydration. Bleeding was most likely an infectious gastroenteritis, but differential includes internal hemorrhoid, AVM, or ischemic colitis. Lactic acid, CMET, lipase and stool culture were checked to rule out alternative causes, but these studies were inconclusive. We held his home ASA, which he also stopped at discharge. GI was not consulted since this problem resolved. He will follow up with his PCP at [facility] in one week. If he has any bleeding, he will call the clinic immediately. Will defer decision to PCP if GI is appropriate for follow-up.  2. Anemia-acute blood loss with chronic disease. Baseline is 10-11 range, but hemoglobin decline documented from 14 to 11. Likely secondary to hemodilution with dehydration, because this is observed after fluid hydration in ED prior to admission, so not likely a hemodynamically significant change. Repeat CBC were stable. According to patient he receives anemia treatment (uncertain name) with HD on wednesdays.   3. ESRD on hemodialysis-MWF HD in [place]. Patient received HD x1 during admission. Will continue with his normal schedule at discharge.  4. Congestive heart  failure (CHF)-no signs of acute exacerbation or fluid overload. Continue home ACEi, BB, CCB. Will stop home ASA with concern for GI bleed.   5. Hypertension-DBP above goal. Continued home meds as scheduled, and resumed at discharge  6. Bipolar 2 disorder/anxiety-seems stable. No medication changes. Has prn klonopin taken prior to HD sessions at home which he required as inpatient as well.   Consults: nephrology  Significant Diagnostic Studies: Results for [name] (MRN [id number]) as of 11/02/2011 12:09  10/31/2011 11:14 11/01/2011 07:26 11/01/2011 14:05 11/02/2011 10:13  HGB 14.3 11.1 (L) 11.8 (L) 10.7 (L)    Treatments: dialysis: Hemodialysis  Discharge Exam: Blood pressure 128/82, pulse 70, temperature 98.2 F (36.8 C), temperature source Oral, resp. rate 18, height 6\' 3"  (1.905 m), weight 205 lb 7.5 oz (93.2 kg), SpO2 100.00%. See daily progress note for full physical exam  Disposition: Home or Self Care  Discharge Orders    Future Appointments: Provider: Department: Dept Phone: Center:   11/09/2011 8:30 AM Karlene Einstein Francesco Sor [facility] Res[facility] [phone] [facility]     Medication List  As of 11/02/2011 12:01 PM   CONTINUE taking these medications         amLODipine 5 MG tablet   Commonly known as: NORVASC      carvedilol 25 MG tablet   Commonly known as: COREG      cinacalcet 30 MG tablet   Commonly known as: SENSIPAR      clonazePAM 2 MG tablet   Commonly known as: KLONOPIN      cloNIDine 0.2 MG tablet   Commonly known as: CATAPRES      econazole nitrate 1 % cream  lanthanum 1000 MG chewable tablet   Commonly known as: FOSRENOL      lisinopril 20 MG tablet   Commonly known as: PRINIVIL,ZESTRIL      loperamide 2 MG capsule   Commonly known as: IMODIUM   Take 1 capsule (2 mg total) by mouth 4 (four) times daily as needed for diarrhea or loose stools.      ondansetron 8 MG disintegrating tablet   Commonly known as: ZOFRAN-ODT   Take 1 tablet (8 mg total) by  mouth every 8 (eight) hours as needed for nausea.      vardenafil 20 MG tablet   Commonly known as: LEVITRA      ZANTAC 150 MG capsule   Generic drug: ranitidine         STOP taking these medications         aspirin 81 MG chewable tablet           Follow-up Information    Follow up with DE LA CRUZ,IVY on 11/09/2011. (at 8:30am. If you cannot keep this appointment time, please call the clinic  as soon as possible to reschedule.)    Contact information:   [place] [phone]         Signed: Melrose Nakayama 11/02/2011, 12:01 PM

## 2011-11-02 NOTE — Progress Notes (Signed)
Patient ID: [name], male   DOB: [birth year], 40 y.o.   MRN: [id number] I was present at this session.  I have reviewed the session itself and made appropriate changes.  [name] L 12/26/201210:06 AM

## 2011-11-02 NOTE — ED Provider Notes (Signed)
Medical screening examination/treatment/procedure(s) were performed by non-physician practitioner and as supervising physician I was immediately available for consultation/collaboration.   Delora Fuel, MD [phone]

## 2011-11-06 LAB — STOOL CULTURE

## 2011-11-09 ENCOUNTER — Ambulatory Visit: Payer: Medicare Other | Admitting: Family Medicine

## 2011-11-09 ENCOUNTER — Encounter: Payer: Self-pay | Admitting: Family Medicine

## 2011-11-09 ENCOUNTER — Ambulatory Visit (INDEPENDENT_AMBULATORY_CARE_PROVIDER_SITE_OTHER): Payer: Medicare Other | Admitting: Family Medicine

## 2011-11-09 DIAGNOSIS — K922 Gastrointestinal hemorrhage, unspecified: Secondary | ICD-10-CM

## 2011-11-09 NOTE — Assessment & Plan Note (Signed)
Hospital follow up for rectal bleeding.  No blood per rectum for > 2 weeks.  Seems to have resolved - likely 2/2 infectious gastroenteritis.  Return to clinic if bleeding returns.  Follow up in 4 months.  See AVS for further instructions.

## 2011-11-09 NOTE — Patient Instructions (Signed)
Continue to increase fiber in our diet.  You may purchase Metamucil or OTC Miralax if you are straining. If you notice any bright red blood in stool, please call MD or go to ED. Schedule follow up appointment with me in 4 months or sooner as needed.  Rectal Bleeding Rectal bleeding is when blood passes out of the anus. It is usually a sign that something is wrong. It may not be serious, but it should always be evaluated. Rectal bleeding may present as bright red blood or extremely dark stools. The color may range from dark red or maroon to black (like tar). It is important that the cause of rectal bleeding be identified so treatment can be started and the problem corrected. CAUSES   Hemorrhoids. These are enlarged (dilated) blood vessels or veins in the anal or rectal area.   Fistulas. Theseare abnormal, burrowing channels that usually run from inside the rectum to the skin around the anus. They can bleed.   Anal fissures. This is a tear in the tissue of the anus. Bleeding occurs with bowel movements.   Diverticulosis. This is a condition in which pockets or sacs project from the bowel wall. Occasionally, the sacs can bleed.   Diverticulitis. Thisis an infection involving diverticulosis of the colon.   Proctitis and colitis. These are conditions in which the rectum, colon, or both, can become inflamed and pitted (ulcerated).   Polyps and cancer. Polyps are non-cancerous (benign) growths in the colon that may bleed. Certain types of polyps turn into cancer.   Protrusion of the rectum. Part of the rectum can project from the anus and bleed.   Certain medicines.   Intestinal infections.   Blood vessel abnormalities.  HOME CARE INSTRUCTIONS  Eat a high-fiber diet to keep your stool soft.   Limit activity.   Drink enough fluids to keep your urine clear or pale yellow.   Warm baths may be useful to soothe rectal pain.   Follow up with your caregiver as directed.  SEEK IMMEDIATE  MEDICAL CARE IF:  You develop increased bleeding.   You have black or dark red stools.   You vomit blood or material that looks like coffee grounds.   You have abdominal pain or tenderness.   You have a fever.   You feel weak, nauseous, or you faint.   You have severe rectal pain or you are unable to have a bowel movement.  MAKE SURE YOU:  Understand these instructions.   Will watch your condition.   Will get help right away if you are not doing well or get worse.  Document Released: 04/15/2002 Document Revised: 07/06/2011 Document Reviewed: 04/10/2011 [facility] Patient Information 2012 [place].

## 2011-11-09 NOTE — Progress Notes (Signed)
  Subjective:    Patient ID: [name], male    DOB: May 10, 1971, 41 y.o.   MRN: [id number]  HPI Patient presents to clinic for hospital followup for rectal bleed. Patient was seen on December 24 because he noticed bright red blood in his stool. He went to the ED, diagnosed with infectious gastroenteritis, and was discharged home. The next day, patient had 2 episodes of bright red blood per rectum. He went back to the ED and was admitted for observation. While in the hospital, patient's stool was dark but he did not have any further bleeding. Now, patient says bleeding has resolved. Stool is soft and normal in color. He is increased fiber in his diet. Denies any straining. Denies any hemoptysis, hematemesis. His last hemoglobin on December 31 was 10.7 (baseline 10-11).   Review of Systems Denies any fever, chills, night sweats, difficulty breathing, fatigue or peripheral edema.    Objective:   Physical Exam  Constitutional: No distress.  Neck: No JVD present.  Cardiovascular: Normal rate and regular rhythm.   Murmur heard. Pulmonary/Chest: Effort normal and breath sounds normal. He has no wheezes. He has no rales.  Abdominal: Soft. Bowel sounds are normal. There is no tenderness. There is no rebound and no guarding.  Musculoskeletal: He exhibits no edema.          Assessment & Plan:

## 2011-11-24 ENCOUNTER — Other Ambulatory Visit: Payer: Self-pay | Admitting: Family Medicine

## 2011-11-24 DIAGNOSIS — N5089 Other specified disorders of the male genital organs: Secondary | ICD-10-CM

## 2011-11-25 ENCOUNTER — Telehealth: Payer: Self-pay | Admitting: Family Medicine

## 2011-11-25 NOTE — Telephone Encounter (Signed)
[name] with pt and he informed me that he is no longer having the swelling/pain in his testicle and since he already has a urologist (Dr. Rosana Hoes at [facility]) [phone] he will follow up with him and inform him of the issues that he is/was having. Will forward to pcp and cancel the referral .  ~T

## 2012-01-25 ENCOUNTER — Encounter: Payer: Self-pay | Admitting: Family Medicine

## 2012-01-25 ENCOUNTER — Ambulatory Visit (INDEPENDENT_AMBULATORY_CARE_PROVIDER_SITE_OTHER): Payer: Medicare Other | Admitting: Family Medicine

## 2012-01-25 VITALS — BP 169/97 | HR 72 | Temp 98.4°F | Ht 75.0 in | Wt 215.2 lb

## 2012-01-25 DIAGNOSIS — J069 Acute upper respiratory infection, unspecified: Secondary | ICD-10-CM

## 2012-01-25 DIAGNOSIS — I10 Essential (primary) hypertension: Secondary | ICD-10-CM

## 2012-01-25 HISTORY — DX: Acute upper respiratory infection, unspecified: J06.9

## 2012-01-25 MED ORDER — BENZONATATE 100 MG PO CAPS: 100.0000 mg | ORAL_CAPSULE | Freq: Two times a day (BID) | ORAL | Status: AC | PRN

## 2012-01-25 MED ORDER — FLUTICASONE PROPIONATE 50 MCG/ACT NA SUSP: 2.0000 | Freq: Every day | NASAL | Status: DC

## 2012-01-25 NOTE — Patient Instructions (Addendum)
Please pick up Ladona Ridgel and Flonase from pharmacy. You may also purchase over the counter Cepacol cough drops for sore throat. You can try drinking Chamomile tea with honey and lemon to help soothe sore throat and relieve cough. If you develop fever, chills, vomiting, chest pain, SOB, worsening cough, please call MD or return to clinic.  Upper Respiratory Infection, Adult An upper respiratory infection (URI) is also known as the common cold. It is often caused by a type of germ (virus). Colds are easily spread (contagious). You can pass it to others by kissing, coughing, sneezing, or drinking out of the same glass. Usually, you get better in 1 or 2 weeks.  HOME CARE   Only take medicine as told by your doctor.   Use a warm mist humidifier or breathe in steam from a hot shower.   Drink enough water and fluids to keep your pee (urine) clear or pale yellow.   Get plenty of rest.   Return to work when your temperature is back to normal or as told by your doctor. You may use a face mask and wash your hands to stop your cold from spreading.  GET HELP RIGHT AWAY IF:   After the first few days, you feel you are getting worse.   You have questions about your medicine.   You have chills, shortness of breath, or brown or red spit (mucus).   You have yellow or brown snot (nasal discharge) or pain in the face, especially when you bend forward.   You have a fever, puffy (swollen) neck, pain when you swallow, or white spots in the back of your throat.   You have a bad headache, ear pain, sinus pain, or chest pain.   You have a high-pitched whistling sound when you breathe in and out (wheezing).   You have a lasting cough or cough up blood.   You have sore muscles or a stiff neck.  MAKE SURE YOU:   Understand these instructions.   Will watch your condition.   Will get help right away if you are not doing well or get worse.  Document Released: 04/11/2008 Document Revised: 10/13/2011  Document Reviewed: 02/28/2011 [facility] Patient Information 2012 [place].

## 2012-01-25 NOTE — Assessment & Plan Note (Signed)
BP elevated today.  Encouraged patient to take BP medications when he gets home.

## 2012-01-25 NOTE — Progress Notes (Signed)
  Subjective:    Patient ID: [name], male    DOB: October 23, 1971, 41 y.o.   MRN: [id number]  HPI  Patient presents with 2 day history of cough, sneezing, and sore throat.  Sneezing started first over the weekend.  Cough is dry - no sputum.  Sore throat is more of a "tingling sensation."  Denies any sick contacts.  Denies any chest pain, SOB, peripheral edema.  Denies nausea/vomiting.  He does "get really cold" at night but no rigors.    Patient's BP elevated today.  He had dialysis today, but did not take take BP medications yet.  Review of Systems  Per HPI    Objective:   Physical Exam  Constitutional: He appears well-developed and well-nourished. No distress.  HENT:  Head: Normocephalic and atraumatic.  Mouth/Throat: Oropharynx is clear and moist.  Pulmonary/Chest: Effort normal and breath sounds normal. No respiratory distress. He has no wheezes. He has no rales.  Musculoskeletal: He exhibits no edema.  Skin: Skin is warm and dry. No rash noted.          Assessment & Plan:

## 2012-01-25 NOTE — Assessment & Plan Note (Signed)
Likely viral URI - no fever or wet cough, less likely pneumonia. Supportive therapy for now - Tessalon perles, Flonase, and cough drops. If not better in 1-2 weeks, advised patient to call MD or return to clinic.

## 2012-01-26 ENCOUNTER — Telehealth: Payer: Self-pay | Admitting: Family Medicine

## 2012-01-26 NOTE — Telephone Encounter (Signed)
Patient is calling because he needs the liquid form of Tessalon Pearls instead of the capsule.

## 2012-01-26 NOTE — Telephone Encounter (Signed)
Returned call to patient.  Patient was unaware that Ladona Ridgel is in pill form and did not pick up Rx from pharmacy.  Prefers to take liquid cough med since he had trach one year ago and has problem with swallowing pills.  Will route note to Dr. Francesco Sor and call patient back.  Nolene Ebbs, RN

## 2012-01-27 ENCOUNTER — Other Ambulatory Visit: Payer: Self-pay | Admitting: Family Medicine

## 2012-01-27 MED ORDER — DEXTROMETHORPHAN HBR 10 MG/5ML PO LIQD: 20.0000 mg | ORAL | Status: DC | PRN

## 2012-01-27 MED ORDER — DEXTROMETHORPHAN POLISTIREX 30 MG/5ML PO LQCR: 60.0000 mg | Freq: Every day | ORAL | Status: AC | PRN

## 2012-01-27 NOTE — Telephone Encounter (Signed)
Please call patient and let him know I sent another cough syrup to his pharmacy.

## 2012-01-27 NOTE — Telephone Encounter (Signed)
Called and left message on voicemail for patient to check with his pharmacy.  Nolene Ebbs, RN

## 2012-05-02 ENCOUNTER — Encounter: Payer: Self-pay | Admitting: Cardiovascular Disease

## 2012-05-02 ENCOUNTER — Ambulatory Visit (INDEPENDENT_AMBULATORY_CARE_PROVIDER_SITE_OTHER): Payer: Medicare Other | Admitting: Cardiovascular Disease

## 2012-05-02 VITALS — BP 98/64 | HR 91 | Ht 75.0 in | Wt 218.0 lb

## 2012-05-02 DIAGNOSIS — N186 End stage renal disease: Secondary | ICD-10-CM

## 2012-05-02 DIAGNOSIS — I429 Cardiomyopathy, unspecified: Secondary | ICD-10-CM

## 2012-05-02 DIAGNOSIS — F3181 Bipolar II disorder: Secondary | ICD-10-CM

## 2012-05-02 DIAGNOSIS — I509 Heart failure, unspecified: Secondary | ICD-10-CM

## 2012-05-02 DIAGNOSIS — I428 Other cardiomyopathies: Secondary | ICD-10-CM

## 2012-05-02 DIAGNOSIS — Z992 Dependence on renal dialysis: Secondary | ICD-10-CM

## 2012-05-02 DIAGNOSIS — F3189 Other bipolar disorder: Secondary | ICD-10-CM

## 2012-05-02 NOTE — Patient Instructions (Addendum)
Your physician recommends that you schedule a follow-up appointment in: AFTER ECHO  Your physician recommends that you continue on your current medications as directed. Please refer to the Current Medication list given to you today. Your physician has requested that you have an echocardiogram. Echocardiography is a painless test that uses sound waves to create images of your heart. It provides your doctor with information about the size and shape of your heart and how well your heart's chambers and valves are working. This procedure takes approximately one hour. There are no restrictions for this procedure. DX CARDIOMYOPATHY

## 2012-05-02 NOTE — Assessment & Plan Note (Signed)
His health care is a bit scatterred between [place]  I wonder if this has something to do with it.  F/U primary

## 2012-05-02 NOTE — Assessment & Plan Note (Addendum)
Not sure why BP so labile Will have to discuss with nephrology.  Odd that he is still on 3 BP pills if running so low on dialysis.  Clonidine would be the worst if his compliance is bad.  I am sure that they have checked labs and random cortisol.  Can consider midodrine if EF still normal.   He does not take his meds on M,WF days of dialysis.  I wonder if a nonfunctioning kidney contributes to BP lability.

## 2012-05-02 NOTE — Progress Notes (Signed)
Patient ID: Mister Huckle, male   DOB: 1970-12-06, 41 y.o.   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  Primarily followed at [facility] and wants to proceed with retransplant.  W/U had been slowed by loss of job.  Has seen Dr Marlou Porch at [facility] for years but couldn't pay bill and was kicked out of practice.  History of cardiomyopathy.  EF 45% ? Only echo in our system from 2009 shows EF 65%  Cath done 01/2008 with no CAD and EF also 65%.  Dr Marlou Porch note indicates improvement from previous echo.  Has had labile BP.  Gets hypotensive on dialysis.  Frequently not able to draw enough fluid off.  Primary renal Dr at [facility] is ? [address] Cocking [phone] and ? Dr D [phone].    ROS: Denies fever, malais, weight loss, blurry vision, decreased visual acuity, cough, sputum, SOB, hemoptysis, pleuritic pain, palpitaitons, heartburn, abdominal pain, melena, lower extremity edema, claudication, or rash.  All other systems reviewed and negative   General: Affect appropriate Chronically ill black male HEENT: normal Neck supple with no adenopathy JVP normal no bruits no thyromegaly Lungs clear with no wheezing and good diaphragmatic motion Heart:  S1/S2 no murmur,rub, gallop or click PMI normal Abdomen: benighn, BS positve, no tenderness, no AAA Transplanted kidney in RLQ no bruit.  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 amLODipine (NORVASC) 5 MG tablet Take 2.5 mg by mouth 2 (two) times daily.       . carvedilol (COREG) 25 MG tablet Take 25 mg by mouth 2 (two) times daily with a meal.       . cinacalcet (SENSIPAR) 30 MG tablet Take 60 mg by mouth daily.       . clonazePAM (KLONOPIN) 2 MG tablet Take  1 mg by mouth 2 (two) times daily as needed. For nerves      . cloNIDine (CATAPRES) 0.2 MG tablet Take 0.2 mg by mouth 2 (two) times daily.        Marland Kitchen econazole nitrate 1 % cream Apply 1 application topically as needed.       Marland Kitchen lisinopril (PRINIVIL,ZESTRIL) 20 MG tablet Take 20 mg by mouth daily.        . ranitidine (ZANTAC) 150 MG capsule Take 150 mg by mouth every evening.        . Sevelamer Carbonate (RENVELA) 2.4 G PACK Take 2.4 g by mouth 3 (three) times daily.      . vardenafil (LEVITRA) 20 MG tablet Take 20 mg by mouth daily as needed. For erectile dysfuction      . clonazePAM (KLONOPIN) 1 MG tablet Take 1 tablet (1 mg total) by mouth daily as needed for anxiety (prior to dialysis M, W, F).  12 tablet  0    Allergies Delacort; Food; and Nitrates, organic  Family History: Family History  Problem Relation Age of Onset  . Arthritis Mother   . Hypertension Mother   . Diabetes Mother   . Arthritis Father   . Hypertension Father   . Diabetes Father     Social History: History   Social History  . Marital Status: Single    Spouse Name:  N/A    Number of Children: N/A  . Years of Education: N/A   Occupational History  . Not on file.   Social History Main Topics  . Smoking status: Never Smoker   . Smokeless tobacco: Not on file  . Alcohol Use: 0.5 oz/week    1 drink(s) per week     drinks liquor once a month  . Drug Use: No  . Sexually Active: Not on file   Other Topics Concern  . Not on file   Social History Narrative  . No narrative on file    Electrocardiogram:  NSR rate 63 LVH with inferolateral T wave inversion  Done 11/07/11  Assessment and Plan

## 2012-05-02 NOTE — Assessment & Plan Note (Signed)
Apparantly before good Rx of BP  Has been normal since 2009  Check echo

## 2012-05-09 ENCOUNTER — Ambulatory Visit (INDEPENDENT_AMBULATORY_CARE_PROVIDER_SITE_OTHER): Payer: Medicare Other | Admitting: Cardiovascular Disease

## 2012-05-09 ENCOUNTER — Ambulatory Visit (HOSPITAL_COMMUNITY): Payer: Medicare Other | Attending: Cardiovascular Disease

## 2012-05-09 ENCOUNTER — Encounter: Payer: Self-pay | Admitting: Cardiovascular Disease

## 2012-05-09 VITALS — BP 105/69 | HR 85 | Ht 75.0 in | Wt 219.0 lb

## 2012-05-09 DIAGNOSIS — I509 Heart failure, unspecified: Secondary | ICD-10-CM

## 2012-05-09 DIAGNOSIS — Z992 Dependence on renal dialysis: Secondary | ICD-10-CM

## 2012-05-09 DIAGNOSIS — N186 End stage renal disease: Secondary | ICD-10-CM | POA: Insufficient documentation

## 2012-05-09 DIAGNOSIS — F319 Bipolar disorder, unspecified: Secondary | ICD-10-CM | POA: Insufficient documentation

## 2012-05-09 DIAGNOSIS — I10 Essential (primary) hypertension: Secondary | ICD-10-CM

## 2012-05-09 DIAGNOSIS — I429 Cardiomyopathy, unspecified: Secondary | ICD-10-CM

## 2012-05-09 NOTE — Progress Notes (Signed)
Echocardiogram performed.  

## 2012-05-09 NOTE — Assessment & Plan Note (Signed)
Labile and low during dialysis  Decrease clonidine to nightly and then try to D/C in 3 weeks

## 2012-05-09 NOTE — Assessment & Plan Note (Signed)
F/U with [facility] nephrology Case discussed with Dr Azzie Roup  If a kidney is found there are no cardiac contraindications to re transplant

## 2012-05-09 NOTE — Progress Notes (Signed)
Patient ID: [name], male   DOB: [birth year], 41 y.o.   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 Primarily followed at [facility] and wants to proceed with retransplant. W/U had been slowed by loss of job. Has seen Dr Marlou Porch at [place] for years but couldn't pay bill and was kicked out of practice. History of cardiomyopathy. EF 45% ? Only echo in our system from 2009 shows EF 65% Cath done 01/2008 with no CAD and EF also 65%. Dr Marlou Porch note indicates improvement from previous echo. Has had labile BP. Gets hypotensive on dialysis. Frequently not able to draw enough fluid off. Primary renal Dr at [facility] is ? Domingo Cocking [phone] and ? Dr D  [phone].   Echo today showed moderate LVH septal thickness 35mm MAC and AV sclerosis. EF 65%  Discussed case with Dr Azzie Roup who saw patient in hospital.  He agrees with cutting back clonidine to once/day and then stopping it in 3 weeks if BP not too high This med is most likely to casues lability when held  ROS: Denies fever, malais, weight loss, blurry vision, decreased visual acuity, cough, sputum, SOB, hemoptysis, pleuritic pain, palpitaitons, heartburn, abdominal pain, melena, lower extremity edema, claudication, or rash.  All other systems reviewed and negative  General: Affect appropriate Black male HEENT: normal Neck supple with no adenopathy JVP normal no bruits no thyromegaly Lungs clear with no wheezing and good diaphragmatic motion Heart:  S1/S2 no murmur, no rub, gallop or click PMI normal Abdomen: benighn, BS positve, no tenderness, no AAA Tplant kidney in RLQ no bruit.  No HSM or HJR Distal pulses intact with no bruits No edema Neuro non-focal Skin warm and dry No muscular weakness Fistula LUE with loud thrill   Current Outpatient Prescriptions  Medication Sig Dispense Refill  . amLODipine  (NORVASC) 2.5 MG tablet Take 2.5 mg by mouth 2 (two) times daily.      . carvedilol (COREG) 25 MG tablet Take 25 mg by mouth 2 (two) times daily with a meal.       . cinacalcet (SENSIPAR) 60 MG tablet Take 60 mg by mouth daily.      . clonazePAM (KLONOPIN) 1 MG tablet Take 1 tablet (1 mg total) by mouth daily as needed for anxiety (prior to dialysis M, W, F).  12 tablet  0  . clonazePAM (KLONOPIN) 2 MG tablet Take 1 mg by mouth 2 (two) times daily as needed. For nerves      . cloNIDine (CATAPRES) 0.2 MG tablet Take 0.2 mg by mouth 2 (two) times daily.        Marland Kitchen econazole nitrate 1 % cream Apply 1 application topically as needed.       Marland Kitchen lisinopril (PRINIVIL,ZESTRIL) 20 MG tablet Take 20 mg by mouth daily.        . ranitidine (ZANTAC) 150 MG capsule Take 150 mg by mouth every evening.        . Sevelamer Carbonate (RENVELA) 2.4 G PACK Take 2.4 g by mouth 3 (three) times daily.      . vardenafil (LEVITRA) 20 MG tablet Take 20 mg by mouth daily as needed. For erectile dysfuction        Allergies  Delacort; Food; and Nitrates, organic  Electrocardiogram: SR rate 63 LVH with strain  Assessment and Plan

## 2012-05-09 NOTE — Patient Instructions (Signed)
Your physician wants you to follow-up in:  6 MONTHS WITH DR NISHAN  You will receive a reminder letter in the mail two months in advance. If you don't receive a letter, please call our office to schedule the follow-up appointment. Your physician recommends that you continue on your current medications as directed. Please refer to the Current Medication list given to you today. 

## 2012-05-09 NOTE — Assessment & Plan Note (Signed)
Resolved.  EF normal by echo May be prone to diastolic dysfunction given LVH  Fliuid control with dialysis

## 2012-06-11 ENCOUNTER — Ambulatory Visit (INDEPENDENT_AMBULATORY_CARE_PROVIDER_SITE_OTHER): Payer: Medicare Other | Admitting: Family Medicine

## 2012-06-11 ENCOUNTER — Encounter: Payer: Self-pay | Admitting: Family Medicine

## 2012-06-11 VITALS — BP 134/86 | HR 89 | Temp 99.3°F | Ht 75.0 in | Wt 220.0 lb

## 2012-06-11 DIAGNOSIS — N529 Male erectile dysfunction, unspecified: Secondary | ICD-10-CM

## 2012-06-11 DIAGNOSIS — N5089 Other specified disorders of the male genital organs: Secondary | ICD-10-CM

## 2012-06-11 NOTE — Patient Instructions (Addendum)
It was good to see you today. Your blood pressure is within normal limits. Try to start exercising 5 days per week. Schedule follow up appointment with me in 6 months.

## 2012-06-11 NOTE — Assessment & Plan Note (Signed)
Patient has seen urology and has a follow up appointment in 3 weeks.

## 2012-06-11 NOTE — Progress Notes (Signed)
  Subjective:    Patient ID: [name], male    DOB: [birth year], 41 y.o.   MRN: [id number]  HPI  Patient presents to clinic for annual physical.  Patient is on the waiting list at [facility] for a kidney transplant.    Erectile Dysfunction: Patient would like to stop taking Levitra and start Cialis instead.  He was told by a pharmacist that Cialis can be taken daily instead of as needed.  Patient has a history of CHF, EF now 55-60%, Pulmonary HTN, and ESRD on HD.  He cannot take Viagra due to his CHF.  Patient also has a urologist, cardiologist, and nephrologist but wants me to refill Cialis because he does not feel comfortable talking about this to his nephrologist at [facility].  Otherwise, patient is doing well and has no complaints of fever nausea vomiting.  Denies any CP, SOB, pedal edema, or headache.  Review of Systems  Per HPI    Objective:   Physical Exam  BP 134/86  Pulse 89  Temp 99.3 F (37.4 C) (Oral)  Ht 6\' 3"  (1.905 m)  Wt 220 lb (99.791 kg)  BMI 27.50 kg/m2  General Appearance:    Alert, cooperative, no distress  Head:    Normocephalic, without obvious abnormality, atraumatic  Eyes:    PERRL, sclera injected bilaterally  Throat:   Lips, mucosa, and tongue normal; teeth and gums normal  Neck:   Supple, symmetrical, trachea midline, no adenopathy;       thyroid:  No enlargement/tenderness/nodules; no carotid   bruit or JVD  Lungs:     Clear to auscultation bilaterally, respirations unlabored  Heart:    Regular rate and rhythm, S1 and S2 normal, 3/6 SEM  Abdomen:     Soft, non-tender, bowel sounds active all four quadrants  Genitalia:    LT testicle larger than RT, but no erythema or pain on palpation  Extremities:   Extremities normal, atraumatic, no cyanosis or edema  Pulses:   2+ and symmetric all extremities  Skin:   Skin color, texture, turgor normal, no rashes or lesions  Neurologic:   No focal deficits.      Assessment & Plan:

## 2012-06-11 NOTE — Assessment & Plan Note (Signed)
Patient requesting Cialis instead of Levitra so he can take it daily. Will route to Dr. Valentina Lucks and see if there are any contraindications to starting Cialis in a patient with CHF and ESRD.

## 2012-06-19 ENCOUNTER — Telehealth: Payer: Self-pay | Admitting: Family Medicine

## 2012-06-19 ENCOUNTER — Other Ambulatory Visit: Payer: Self-pay | Admitting: Family Medicine

## 2012-06-19 NOTE — Telephone Encounter (Signed)
He was pretty vague about what it was, but acted like that Dr  Cori Razor Maryjean Morn would know what he is talking about.

## 2012-06-19 NOTE — Telephone Encounter (Signed)
Pt is asking about information that Dr Cori Razor Maryjean Morn was going to look for him - was here last week and discussed this.

## 2012-06-19 NOTE — Telephone Encounter (Signed)
Do you know what information patient is talking about?

## 2012-06-19 NOTE — Telephone Encounter (Signed)
I do know what he is talking about and I have discussed with pharmacy resident.  I will call him back later today.  Thanks.

## 2012-06-20 ENCOUNTER — Other Ambulatory Visit: Payer: Self-pay | Admitting: Family Medicine

## 2012-06-20 ENCOUNTER — Telehealth: Payer: Self-pay | Admitting: Cardiovascular Disease

## 2012-06-20 DIAGNOSIS — Z01818 Encounter for other preprocedural examination: Secondary | ICD-10-CM

## 2012-06-20 MED ORDER — TADALAFIL 5 MG PO TABS: ORAL_TABLET | ORAL | Status: DC

## 2012-06-20 NOTE — Telephone Encounter (Signed)
REVIEWED LAST OFFICE NOTE  STATES PT  MAY PROCEED WITH RETRANSPLANT IF KIDNEY FOUND WILL FORWARD TO DR Johnsie Cancel FOR REVIEW NOT SURE ANY TESTING IS  NEEDED PRIOR.

## 2012-06-20 NOTE — Telephone Encounter (Signed)
New msg Pt wants to talk to you what test he needs to have done before kidney transplant

## 2012-06-20 NOTE — Telephone Encounter (Signed)
[facility] will likely need lexiscan myovue before transplant.

## 2012-06-20 NOTE — Telephone Encounter (Signed)
CALLED PT  PER DR NISHAN PT NEEDS LEXISCAN , APPT MADE FOR TOMORROW   06-21-12  AT  8;45 AM .Adonis Housekeeper

## 2012-06-21 ENCOUNTER — Other Ambulatory Visit (HOSPITAL_COMMUNITY): Payer: Medicare Other

## 2012-06-25 ENCOUNTER — Telehealth: Payer: Self-pay | Admitting: *Deleted

## 2012-06-25 ENCOUNTER — Telehealth: Payer: Self-pay | Admitting: Cardiovascular Disease

## 2012-06-25 MED ORDER — CLONIDINE HCL 0.1 MG PO TABS: 0.1000 mg | ORAL_TABLET | Freq: Two times a day (BID) | ORAL | Status: DC

## 2012-06-25 NOTE — Telephone Encounter (Signed)
PER PT B/P HAS BEEN  STEADILY CREEPING BACK UP SINCE CLONIDINE STOPPED   PT STATES DOES NOT  TOLERATE  BENICAR OR MINOXIDIL WILL FORWARD TO DR Johnsie Cancel FOR REVIEW .Adonis Housekeeper

## 2012-06-25 NOTE — Telephone Encounter (Signed)
Left message for patient to call and reschedule myoview.

## 2012-06-25 NOTE — Telephone Encounter (Signed)
175/107 bp today at dialysis when he got there and 143-102 when he left , Clonidine was stopped a little over a week ago per dr Johnsie Cancel, what to do re high BP? pls call [phone] will go to work at 3p so if you call after that ok to leave a message

## 2012-06-25 NOTE — Telephone Encounter (Signed)
Ok to go back on clonidine just .1mg  bid

## 2012-06-25 NOTE — Telephone Encounter (Signed)
LEFT MESSAGE FOR PT  RE MED CHANGE START CLONIDINE 0.1 MG BID MED SENT VIA EPIC TO [facility] .Adonis Housekeeper

## 2012-06-26 NOTE — Telephone Encounter (Signed)
PT AWARE OF MED CHANGE./CY

## 2012-07-10 ENCOUNTER — Telehealth: Payer: Self-pay | Admitting: Family Medicine

## 2012-07-10 DIAGNOSIS — N5089 Other specified disorders of the male genital organs: Secondary | ICD-10-CM

## 2012-07-10 NOTE — Telephone Encounter (Signed)
Forwarded to pcp for order.[name], [name]

## 2012-07-10 NOTE — Telephone Encounter (Signed)
Is wanting doctor to set up ultrasound about his testicles

## 2012-07-11 NOTE — Telephone Encounter (Signed)
When scheduling this pt please schedule for either Tuesday or Thursday mornings. Pt has dialysis on M,W,F.[name], [place]

## 2012-07-12 NOTE — Telephone Encounter (Signed)
Scrotal ultrasound has been ordered.  Please inform patient of time and date - please schedule for Tuesday or Thursday AM if possible.

## 2012-07-17 ENCOUNTER — Telehealth: Payer: Self-pay | Admitting: Family Medicine

## 2012-07-17 ENCOUNTER — Ambulatory Visit
Admission: RE | Admit: 2012-07-17 | Discharge: 2012-07-17 | Disposition: A | Discharge status: Home / Self Care | Payer: BC Managed Care – PPO | Source: Ambulatory Visit | Attending: Family Medicine | Admitting: Family Medicine

## 2012-07-17 DIAGNOSIS — N5089 Other specified disorders of the male genital organs: Secondary | ICD-10-CM

## 2012-07-17 NOTE — Telephone Encounter (Signed)
The dosage for Cialis is not working for patient.  Wanted to know if you could call in rx for increase of dosage.  Even tried 10mg  on his own and it is still not working.  If there are any questions or problems with request please call patient.

## 2012-07-17 NOTE — Telephone Encounter (Signed)
Spoke with pt and he told me that he had switched to Cialis b/c he could take this daily and did not have to wait for it to take affect, unlike the lavitra whereas he was needing to take this 1 hour prior to sexual intercourse. I asked pt if he had discussed this with his urologist to get is input as to what would be best for him and he wrote out an Rx for pt to take the meds QD instead of QOD. Pt informed me that his urologist felt like this could be managed thru [place] since she is his primary. Pt stated that urologist or pharmacists stated that he could do the 20 mg tablets. I told him that I would forward this information to his pcp and someone would follow up with him. Pt understood and agreed.Audelia Hives Victor

## 2012-07-18 NOTE — Telephone Encounter (Signed)
Called and informed pt of what Loomis informed me and told him to contact his Renal Dr's at [facility]. And if they approve this for them to give written consent or to call her directly and speak with her about this. Pt voiced understanding and agreed.Elouise Munroe'

## 2012-07-18 NOTE — Telephone Encounter (Signed)
Hi Tonya, the reason for the low dose of Cialis is because he has End Stage Renal Disease.  Cialis 5 mg every 3 days is the correct dose for patients with ESRD.  I discussed this with our Pharmacy team.  If he has more questions about this, he will need to call his Renal doctors at [facility].  Thanks.

## 2012-07-19 ENCOUNTER — Ambulatory Visit (HOSPITAL_COMMUNITY): Payer: Medicare Other | Attending: Internal Medicine | Admitting: Radiology

## 2012-07-19 VITALS — BP 133/79 | Ht 75.0 in | Wt 226.0 lb

## 2012-07-19 DIAGNOSIS — R9431 Abnormal electrocardiogram [ECG] [EKG]: Secondary | ICD-10-CM

## 2012-07-19 DIAGNOSIS — R0989 Other specified symptoms and signs involving the circulatory and respiratory systems: Secondary | ICD-10-CM | POA: Insufficient documentation

## 2012-07-19 DIAGNOSIS — I10 Essential (primary) hypertension: Secondary | ICD-10-CM | POA: Insufficient documentation

## 2012-07-19 DIAGNOSIS — I428 Other cardiomyopathies: Secondary | ICD-10-CM | POA: Insufficient documentation

## 2012-07-19 DIAGNOSIS — I2510 Atherosclerotic heart disease of native coronary artery without angina pectoris: Secondary | ICD-10-CM | POA: Insufficient documentation

## 2012-07-19 DIAGNOSIS — I509 Heart failure, unspecified: Secondary | ICD-10-CM | POA: Insufficient documentation

## 2012-07-19 DIAGNOSIS — R5383 Other fatigue: Secondary | ICD-10-CM | POA: Insufficient documentation

## 2012-07-19 DIAGNOSIS — R5381 Other malaise: Secondary | ICD-10-CM | POA: Insufficient documentation

## 2012-07-19 DIAGNOSIS — R0609 Other forms of dyspnea: Secondary | ICD-10-CM | POA: Insufficient documentation

## 2012-07-19 MED ORDER — REGADENOSON 0.4 MG/5ML IV SOLN
0.4000 mg | Freq: Once | INTRAVENOUS | Status: AC
  Administered 2012-07-19: 0.4 mg via INTRAVENOUS

## 2012-07-19 MED ORDER — TECHNETIUM TC 99M SESTAMIBI GENERIC - CARDIOLITE
30.0000 | Freq: Once | INTRAVENOUS | Status: AC | PRN
  Administered 2012-07-19: 30 via INTRAVENOUS

## 2012-07-19 MED ORDER — TECHNETIUM TC 99M SESTAMIBI GENERIC - CARDIOLITE
10.0000 | Freq: Once | INTRAVENOUS | Status: AC | PRN
  Administered 2012-07-19: 10 via INTRAVENOUS

## 2012-07-19 NOTE — Progress Notes (Signed)
[place] [id number] [phone]  Cardiology Nuclear Med Study  [name] is a 41 y.o. male     MRN : [id number]     DOB: 03-24-1971  Procedure Date: 07/19/2012  Nuclear Med Background Indication for Stress Test:  Evaluation for Ischemia, Surgical Clearance and Pending Renal Transplant History:  CHF, Cardiomyopathy, 2009 Heart Cath: N/O CAD EF: 65%, MPS: [place] NL per patient,05/09/12 ECHO: LVH EF: 65%  Cardiac Risk Factors: Hypertension  Symptoms:  DOE and Fatigue   Nuclear Pre-Procedure Caffeine/Decaff Intake:  None in 12 hours NPO After: 9:00pm   Lungs:  clear O2 Sat: 97% on room air. IV 0.9% NS with Angio Cath:  22g  IV Site: R Wrist  IV Started by:  Annye Rusk, CNMT  Chest Size (in):  44in Cup Size: n/a  Height: 6\' 3"  (1.905 m)  Weight:  226 lb (102.513 kg)  BMI:  Body mass index is 28.25 kg/(m^2). Tech Comments:  Patient held coreg this am    Nuclear Med Study 1 or 2 day study: 1 day  Stress Test Type:  Carlton Adam  Reading MD: Dorris Carnes, MD  Order Authorizing Provider:  P. Johnsie Cancel, MD  Resting Radionuclide: Technetium 45m Sestamibi  Resting Radionuclide Dose: 11.0 mCi   Stress Radionuclide:  Technetium 51m Sestamibi  Stress Radionuclide Dose: 33.0 mCi           Stress Protocol Rest HR: 63 Stress HR: 80  Rest BP: 133/79 Stress BP: 117/69  Exercise Time (min): n/a METS: n/a   Predicted Max HR: 181 bpm % Max HR: 44.2 bpm Rate Pressure Product: 10640   Dose of Adenosine (mg):  n/a Dose of Lexiscan: 0.4 mg  Dose of Atropine (mg): n/a Dose of Dobutamine: n/a mcg/kg/min (at max HR)  Stress Test Technologist: Perrin Maltese, EMT-P  Nuclear Technologist:  Charlton Amor, CNMT     Rest Procedure:  Myocardial perfusion imaging was performed at rest 45 minutes following the intravenous administration of Technetium 79m Sestamibi. Rest ECG: NSR with non-specific ST-T wave changes  Stress Procedure:  The  patient received IV Lexiscan 0.4 mg over 15-seconds.  Technetium 23m Tetrofosmin injected at 30-seconds.  There were no significant changes, sob, head tightness, and anxiety with Lexiscan.  Quantitative spect images were obtained after a 45 minute delay. Stress ECG: No significant change from baseline ECG  QPS Raw Data Images:  Soft tissue (diaphragm) underlies heart. Stress Images:  Normal homogeneous uptake in all areas of the myocardium. Rest Images:  Normal homogeneous uptake in all areas of the myocardium. Subtraction (SDS):  No evidence of ischemia. Transient Ischemic Dilatation (Normal <1.22):  1.04 Lung/Heart Ratio (Normal <0.45):  0.25  Quantitative Gated Spect Images QGS EDV:  221 ml QGS ESV:  094 ml  Impression Exercise Capacity:  Lexiscan with no exercise. BP Response:  Normal blood pressure response. Clinical Symptoms:  No chest pain. ECG Impression:  No significant ST segment change suggestive of ischemia. Comparison with Prior Nuclear Study:No report to review.  Overall Impression:  Normal stress nuclear study.  LV Ejection Fraction: 57%.  LV Wall Motion:  NL LV Function; NL Wall Motion  Dorris Carnes

## 2012-07-20 NOTE — Progress Notes (Signed)
Nuclear Study routed to Dr. Johnsie Cancel.  [name], [name]

## 2012-07-20 NOTE — Addendum Note (Signed)
Addended by: Briscoe Burns on: 07/20/2012 09:10 AM   Modules accepted: Orders

## 2012-07-23 ENCOUNTER — Telehealth: Payer: Self-pay | Admitting: Cardiovascular Disease

## 2012-07-23 NOTE — Progress Notes (Signed)
PT AWARE OF  MYOVIEW RESULTS   WILL FAX COPY OF TEST TO THE [facility]OL[facility] AT [facility]   NUMBER TO CALL  IS [phone]  TO GET FAX NUMBER  PER PT .Adonis Housekeeper

## 2012-07-23 NOTE — Progress Notes (Signed)
LMTCB ./CY 

## 2012-07-23 NOTE — Telephone Encounter (Signed)
PT AWARE OF MYOVIEW RESULTS./CY 

## 2012-07-23 NOTE — Telephone Encounter (Signed)
Patient returning nurse call

## 2012-08-02 ENCOUNTER — Telehealth: Payer: Self-pay | Admitting: Family Medicine

## 2012-08-02 NOTE — Telephone Encounter (Signed)
Urologist, Dr. Rosana Hoes, phone # [phone].  Patient is scheduled to come in and see Dr. Francesco Sor on 9/30 and would like her to try and discuss the medication for his Kidneys with Dr. Rosana Hoes and coordinate his treatment before his appt.

## 2012-08-06 ENCOUNTER — Encounter: Payer: Self-pay | Admitting: Family Medicine

## 2012-08-06 ENCOUNTER — Ambulatory Visit (INDEPENDENT_AMBULATORY_CARE_PROVIDER_SITE_OTHER): Payer: BC Managed Care – PPO | Admitting: Family Medicine

## 2012-08-06 VITALS — BP 189/102 | HR 82 | Temp 99.4°F | Ht 75.0 in | Wt 218.9 lb

## 2012-08-06 DIAGNOSIS — N5089 Other specified disorders of the male genital organs: Secondary | ICD-10-CM

## 2012-08-06 DIAGNOSIS — N529 Male erectile dysfunction, unspecified: Secondary | ICD-10-CM

## 2012-08-06 NOTE — Assessment & Plan Note (Signed)
[place] did show hydrocele.  Patient denies any complaints of pain at this time.  Measurements 22 wide x 7 cm long.

## 2012-08-06 NOTE — Assessment & Plan Note (Addendum)
Spoke to Dr. Valentina Lucks who recommended 5 mg every 3 days, but patient says his urologist increased to 20 mg.   Will call his nephrologist for recommendations regarding Cialis dosing in patient with ESRD.    Dr. Moreen Fowler [phone]

## 2012-08-06 NOTE — Progress Notes (Signed)
  Subjective:    Patient ID: [name], male    DOB: 30-Aug-1971, 41 y.o.   MRN: [id number]  HPI  Patient presents to clinic for follow up US results and to discuss medications.  He has had swelling of RT testicle for over one year.  An Korea was ordered which revealed a hydrocele.  Patient denies any pain at this time, but says that it is more swollen than before.  He is not interested in any surgical intervention at this time.    He would like me to increase his dose of Cialis because current dose is not working.  Because he has ESRD, he should be taking Cialis 5 mg every 3 days.  His urologist told him he could take it daily and then increased it to 20 mg every other day.  Patient says urologist would like me to prescribe it from now.  Patient is currently sexually active, uses condoms, he has had an erection twice in one month when taking Cialis 5 mg daily.   Review of Systems  Per HPI    Objective:   Physical Exam  Genitourinary:    Right testis shows swelling. Right testis shows no tenderness.      Assessment & Plan:

## 2012-08-10 ENCOUNTER — Telehealth: Payer: Self-pay | Admitting: Family Medicine

## 2012-08-10 NOTE — Telephone Encounter (Signed)
Urologist called him and he changed his Cialis to Levitra 20mg  - the Cialis wasn't working

## 2012-08-30 ENCOUNTER — Ambulatory Visit: Payer: BC Managed Care – PPO | Admitting: Cardiovascular Disease

## 2012-11-20 ENCOUNTER — Encounter: Payer: Self-pay | Admitting: Cardiovascular Disease

## 2012-11-20 ENCOUNTER — Ambulatory Visit (INDEPENDENT_AMBULATORY_CARE_PROVIDER_SITE_OTHER): Payer: Medicare Other | Admitting: Cardiovascular Disease

## 2012-11-20 VITALS — BP 178/90 | HR 71 | Ht 75.0 in | Wt 225.0 lb

## 2012-11-20 DIAGNOSIS — I509 Heart failure, unspecified: Secondary | ICD-10-CM

## 2012-11-20 DIAGNOSIS — N186 End stage renal disease: Secondary | ICD-10-CM

## 2012-11-20 DIAGNOSIS — I10 Essential (primary) hypertension: Secondary | ICD-10-CM

## 2012-11-20 DIAGNOSIS — Z992 Dependence on renal dialysis: Secondary | ICD-10-CM

## 2012-11-20 MED ORDER — AMLODIPINE BESYLATE 2.5 MG PO TABS: 5.0000 mg | ORAL_TABLET | Freq: Two times a day (BID) | ORAL | Status: DC

## 2012-11-20 MED ORDER — METHYLDOPA 250 MG PO TABS: 250.0000 mg | ORAL_TABLET | Freq: Two times a day (BID) | ORAL | Status: DC

## 2012-11-20 MED ORDER — AMLODIPINE BESYLATE 5 MG PO TABS: 5.0000 mg | ORAL_TABLET | Freq: Two times a day (BID) | ORAL | Status: DC

## 2012-11-20 NOTE — Progress Notes (Signed)
Patient ID: [name], male   DOB: Mar 23, 1971, 42 y.o.   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 Primarily followed at [facility] and wants to proceed with retransplant. W/U had been slowed by loss of job. Has seen Dr Marlou Porch at [place] for years but couldn't pay bill and was kicked out of practice. History of cardiomyopathy. EF 45% ? Only echo in our system from 2009 shows EF 65% Cath done 01/2008 with no CAD and EF also 65%. Dr Marlou Porch note indicates improvement from previous echo. Has had labile BP. Gets hypotensive on dialysis. Frequently not able to draw enough fluid off. Primary renal Dr at [facility] is ? Domingo Cocking [phone] and ? Dr D  [phone].  Echo 7/13  showed moderate LVH septal thickness 92mm MAC and AV sclerosis. EF 65%   Clonidine dose has been adjusted over the year for BP lability  9/13 Had normal myovue with EF 57%  ROS: Denies fever, malais, weight loss, blurry vision, decreased visual acuity, cough, sputum, SOB, hemoptysis, pleuritic pain, palpitaitons, heartburn, abdominal pain, melena, lower extremity edema, claudication, or rash.  All other systems reviewed and negative  General: Affect appropriate Healthy:  appears stated age 50: normal Neck supple with no adenopathy JVP normal no bruits no thyromegaly Lungs clear with no wheezing and good diaphragmatic motion Heart:  S1/S2 no murmur, no rub, gallop or click PMI normal Abdomen: benighn, BS positve, no tenderness, no AAA no bruit.  No HSM or HJR Distal pulses intact with no bruits No edema Neuro non-focal Skin warm and dry No muscular weakness Large fistula in LUE   Current Outpatient Prescriptions  Medication Sig Dispense Refill  . amLODipine (NORVASC) 2.5 MG tablet Take 2.5 mg by mouth 2 (two) times daily.      . carvedilol (COREG) 25 MG tablet Take 25 mg by mouth 2 (two)  times daily with a meal.       . cinacalcet (SENSIPAR) 60 MG tablet Take 60 mg by mouth daily.      . clonazePAM (KLONOPIN) 1 MG tablet Take 1 tablet (1 mg total) by mouth daily as needed for anxiety (prior to dialysis M, W, F).  12 tablet  0  . cloNIDine (CATAPRES) 0.2 MG tablet Take 0.2 mg by mouth 2 (two) times daily.      Marland Kitchen econazole nitrate 1 % cream Apply 1 application topically as needed.       Marland Kitchen lisinopril (PRINIVIL,ZESTRIL) 20 MG tablet Take 20 mg by mouth 2 (two) times daily.       . ranitidine (ZANTAC) 150 MG capsule Take 150 mg by mouth every evening.        . Sevelamer Carbonate (RENVELA) 2.4 G PACK Take 2.4 g by mouth 3 (three) times daily.      . Vardenafil HCl (LEVITRA PO) Take by mouth as directed.        Allergies  Delacort; Food; and Nitrates, organic  Electrocardiogram:  SR rate 71 LVH with infeorlateral T wave changes  Assessment and Plan

## 2012-11-20 NOTE — Assessment & Plan Note (Signed)
Euvolemic EF improved try to control BP better EF normal by recent echo and myovue

## 2012-11-20 NOTE — Assessment & Plan Note (Signed)
Apparently dialysis in [place] and followed by [facility] nephrologist that wont watch BP.  From my perspective he has no cardiac contraindications to re transplant.  He indicates needing one more test for further evaluation

## 2012-11-20 NOTE — Patient Instructions (Addendum)
Your physician recommends that you schedule a follow-up appointment in:   Nelson has recommended you make the following change in your medication:  INCREASE  AMLODIPINE TO  5 MG TWICE DAILY  AND START ALDOMET  250 MG   TWICE DAILY

## 2012-11-20 NOTE — Assessment & Plan Note (Signed)
Not clear to me why primary care or renal does not follow this.  Increase norvasc to bid and add aldomet.   He is intolerant to benazapril, hydralazine and has had ? Pericardial effusion with minoxidil.

## 2012-12-12 ENCOUNTER — Ambulatory Visit (INDEPENDENT_AMBULATORY_CARE_PROVIDER_SITE_OTHER): Payer: Medicare Other | Admitting: Family Medicine

## 2012-12-12 ENCOUNTER — Encounter: Payer: Self-pay | Admitting: Family Medicine

## 2012-12-12 VITALS — BP 173/102 | HR 80 | Ht 75.0 in | Wt 224.0 lb

## 2012-12-12 DIAGNOSIS — I10 Essential (primary) hypertension: Secondary | ICD-10-CM

## 2012-12-12 DIAGNOSIS — R0602 Shortness of breath: Secondary | ICD-10-CM

## 2012-12-12 HISTORY — DX: Shortness of breath: R06.02

## 2012-12-12 NOTE — Assessment & Plan Note (Signed)
BP still elevated today but patient says he was at dialysis earlier today and BP was normal.  He recently was advised per Renal to increase Clonidine to 0.4 TID.  He also started Aldomet and Norvasc recently per Cardiology.  Patient will get BP rechecked in dialysis next week. Advised him to return to clinic in 4 weeks or sooner as needed.  Counseled patient on DASH diet.  Handout given.

## 2012-12-12 NOTE — Assessment & Plan Note (Signed)
Patient in no respiratory distress at this time.  Last EF in July 2013 55-60%.  No evidence of fluid overload on exam or signs of pneumonia today.  Patient not interested in CXR at this time.  He wants to wait and see if SOB resolves once BP under better control.  Red flags reviewed with patient.  Return to clinic in 4 weeks.

## 2012-12-12 NOTE — Patient Instructions (Addendum)
Your blood pressure is elevated again.  Continue to take BP medications as directed by Nephrology. See handout below regarding DASH diet. Schedule follow up appointment with either myself of Nephrology in 4 weeks to recheck blood pressure.  DASH Diet The DASH diet stands for "Dietary Approaches to Stop Hypertension." It is a healthy eating plan that has been shown to reduce high blood pressure (hypertension) in as little as 14 days, while also possibly providing other significant health benefits. These other health benefits include reducing the risk of breast cancer after menopause and reducing the risk of type 2 diabetes, heart disease, colon cancer, and stroke. Health benefits also include weight loss and slowing kidney failure in patients with chronic kidney disease.  DIET GUIDELINES  Limit salt (sodium). Your diet should contain less than 1500 mg of sodium daily.  Limit refined or processed carbohydrates. Your diet should include mostly whole grains. Desserts and added sugars should be used sparingly.  Include small amounts of heart-healthy fats. These types of fats include nuts, oils, and tub margarine. Limit saturated and trans fats. These fats have been shown to be harmful in the body. CHOOSING FOODS  The following food groups are based on a 2000 calorie diet. See your Registered Dietitian for individual calorie needs. Grains and Grain Products (6 to 8 servings daily)  Eat More Often: Whole-wheat bread, brown rice, whole-grain or wheat pasta, quinoa, popcorn without added fat or salt (air popped).  Eat Less Often: White bread, white pasta, white rice, cornbread. Vegetables (4 to 5 servings daily)  Eat More Often: Fresh, frozen, and canned vegetables. Vegetables may be raw, steamed, roasted, or grilled with a minimal amount of fat.  Eat Less Often/Avoid: Creamed or fried vegetables. Vegetables in a cheese sauce. Fruit (4 to 5 servings daily)  Eat More Often: All fresh, canned (in  natural juice), or frozen fruits. Dried fruits without added sugar. One hundred percent fruit juice ( cup [237 mL] daily).  Eat Less Often: Dried fruits with added sugar. Canned fruit in light or heavy syrup. [employer], Fish, and Poultry (2 servings or less daily. One serving is 3 to 4 oz [85-114 g]).  Eat More Often: Ninety percent or leaner ground beef, tenderloin, sirloin. Round cuts of beef, chicken breast, [place] breast. All fish. Grill, bake, or broil your meat. Nothing should be fried.  Eat Less Often/Avoid: Fatty cuts of meat, [place], or chicken leg, thigh, or wing. Fried cuts of meat or fish. Dairy (2 to 3 servings)  Eat More Often: Low-fat or fat-free milk, low-fat plain or light yogurt, reduced-fat or part-skim cheese.  Eat Less Often/Avoid: Milk (whole, 2%).Whole milk yogurt. Full-fat cheeses. Nuts, Seeds, and Legumes (4 to 5 servings per week)  Eat More Often: All without added salt.  Eat Less Often/Avoid: Salted nuts and seeds, canned beans with added salt. Fats and Sweets (limited)  Eat More Often: Vegetable oils, tub margarines without trans fats, sugar-free gelatin. Mayonnaise and salad dressings.  Eat Less Often/Avoid: Coconut oils, palm oils, butter, stick margarine, cream, half and half, cookies, candy, pie. FOR MORE INFORMATION The Dash Diet Eating Plan: www.dashdiet.org Document Released: 10/13/2011 Document Revised: 01/16/2012 Document Reviewed: 10/13/2011 [facility] Patient Information 2013 [place].

## 2012-12-12 NOTE — Progress Notes (Signed)
  Subjective:    Patient ID: [name], male    DOB: February 15, 1971, 42 y.o.   MRN: [id number]  HPI  Patient presents to clinic to discuss hypertension and worsening SOB.  SOB has worsened in the last 2 weeks.  He cannot ride his bike and walk up stairs without getting SOB.  Patient was seen by cardiology and nephrology recently.  For HTN, nephrologist increased your Clonidine to TID.  He also takes Aldomet, Lisinopril, Carvedilol, and Norvasc.  Patient complains of fatigue, but denies any CP, nausea.vomiting, or pedal edema.  Patient concerned he might have pneumonia.  Patient brought this issue up to both specialists who attribute SOB to his uncontrolled blood pressure.  Denies productive cough or associated fevers.   CHF: Last Echo 05/2012 EF 55-60%.  Review of Systems  Per HPI    Objective:   Physical Exam  Constitutional: He appears well-nourished. No distress.  HENT:  Head: Normocephalic and atraumatic.  Mouth/Throat: Oropharynx is clear and moist.  Cardiovascular: Normal rate and regular rhythm.   Murmur heard. Pulmonary/Chest: Effort normal and breath sounds normal. No respiratory distress. He has no wheezes. He has no rales.  Musculoskeletal: Normal range of motion. He exhibits no edema and no tenderness.      Assessment & Plan:

## 2013-01-05 ENCOUNTER — Inpatient Hospital Stay (HOSPITAL_COMMUNITY)
Admission: EM | Admit: 2013-01-05 | Discharge: 2013-01-07 | DRG: 189 | Disposition: A | Discharge status: Home / Self Care | Payer: Medicare Other | Attending: Internal Medicine | Admitting: Internal Medicine

## 2013-01-05 ENCOUNTER — Emergency Department (HOSPITAL_COMMUNITY): Payer: Medicare Other

## 2013-01-05 ENCOUNTER — Encounter (HOSPITAL_COMMUNITY): Payer: Self-pay | Admitting: Nurse Practitioner

## 2013-01-05 DIAGNOSIS — I120 Hypertensive chronic kidney disease with stage 5 chronic kidney disease or end stage renal disease: Secondary | ICD-10-CM

## 2013-01-05 DIAGNOSIS — D631 Anemia in chronic kidney disease: Secondary | ICD-10-CM | POA: Diagnosis present

## 2013-01-05 DIAGNOSIS — J9600 Acute respiratory failure, unspecified whether with hypoxia or hypercapnia: Principal | ICD-10-CM | POA: Diagnosis present

## 2013-01-05 DIAGNOSIS — Z940 Kidney transplant status: Secondary | ICD-10-CM

## 2013-01-05 DIAGNOSIS — J811 Chronic pulmonary edema: Secondary | ICD-10-CM

## 2013-01-05 DIAGNOSIS — I509 Heart failure, unspecified: Secondary | ICD-10-CM | POA: Diagnosis present

## 2013-01-05 DIAGNOSIS — N186 End stage renal disease: Secondary | ICD-10-CM

## 2013-01-05 DIAGNOSIS — E162 Hypoglycemia, unspecified: Secondary | ICD-10-CM | POA: Diagnosis present

## 2013-01-05 DIAGNOSIS — I161 Hypertensive emergency: Secondary | ICD-10-CM

## 2013-01-05 DIAGNOSIS — F3289 Other specified depressive episodes: Secondary | ICD-10-CM | POA: Diagnosis present

## 2013-01-05 DIAGNOSIS — J9691 Respiratory failure, unspecified with hypoxia: Secondary | ICD-10-CM

## 2013-01-05 DIAGNOSIS — Z992 Dependence on renal dialysis: Secondary | ICD-10-CM

## 2013-01-05 DIAGNOSIS — R51 Headache: Secondary | ICD-10-CM | POA: Diagnosis present

## 2013-01-05 DIAGNOSIS — F411 Generalized anxiety disorder: Secondary | ICD-10-CM | POA: Diagnosis present

## 2013-01-05 DIAGNOSIS — N2581 Secondary hyperparathyroidism of renal origin: Secondary | ICD-10-CM | POA: Diagnosis present

## 2013-01-05 DIAGNOSIS — Z79899 Other long term (current) drug therapy: Secondary | ICD-10-CM

## 2013-01-05 HISTORY — DX: End stage renal disease: N18.6

## 2013-01-05 HISTORY — DX: Dependence on renal dialysis: Z99.2

## 2013-01-05 LAB — POCT I-STAT TROPONIN I: Troponin i, poc: 0.02 ng/mL (ref 0.00–0.08)

## 2013-01-05 LAB — BASIC METABOLIC PANEL
CO2: 23 mEq/L (ref 19–32)
Chloride: 91 mEq/L — ABNORMAL LOW (ref 96–112)
GFR calc Af Amer: 6 mL/min — ABNORMAL LOW (ref >90)
Potassium: 4.2 mEq/L (ref 3.5–5.1)
Sodium: 128 mEq/L — ABNORMAL LOW (ref 135–145)

## 2013-01-05 LAB — CREATININE, SERUM: Creatinine, Ser: 10.93 mg/dL — ABNORMAL HIGH (ref 0.50–1.35)

## 2013-01-05 LAB — GLUCOSE, CAPILLARY: Glucose-Capillary: 108 mg/dL — ABNORMAL HIGH (ref 70–99)

## 2013-01-05 LAB — CBC
Platelets: 145 10*3/uL — ABNORMAL LOW (ref 150–400)
RBC: 3.81 MIL/uL — ABNORMAL LOW (ref 4.22–5.81)
RDW: 13.7 % (ref 11.5–15.5)
WBC: 6.9 10*3/uL (ref 4.0–10.5)

## 2013-01-05 LAB — PRO B NATRIURETIC PEPTIDE: Pro B Natriuretic peptide (BNP): 19674 pg/mL — ABNORMAL HIGH (ref 0–125)

## 2013-01-05 MED ORDER — CLONIDINE HCL 0.3 MG PO TABS: 0.3000 mg | ORAL_TABLET | Freq: Once | ORAL | Status: DC

## 2013-01-05 MED ORDER — ACETAMINOPHEN 80 MG PO CHEW: 500.0000 mg | CHEWABLE_TABLET | Freq: Four times a day (QID) | ORAL | Status: DC | PRN

## 2013-01-05 MED ORDER — PROMETHAZINE HCL 25 MG/ML IJ SOLN
12.5000 mg | Freq: Four times a day (QID) | INTRAMUSCULAR | Status: DC | PRN
  Administered 2013-01-05: 12.5 mg via INTRAVENOUS
  Filled 2013-01-05: qty 1

## 2013-01-05 MED ORDER — LORAZEPAM 2 MG/ML IJ SOLN: 1.0000 mg | Freq: Once | INTRAMUSCULAR | Status: AC

## 2013-01-05 MED ORDER — FAMOTIDINE 20 MG PO TABS
20.0000 mg | ORAL_TABLET | Freq: Every day | ORAL | Status: DC
  Administered 2013-01-06: 20 mg via ORAL
  Filled 2013-01-05 (×3): qty 1

## 2013-01-05 MED ORDER — SEVELAMER CARBONATE 2.4 G PO PACK
2.4000 g | PACK | Freq: Three times a day (TID) | ORAL | Status: DC
  Administered 2013-01-06 – 2013-01-07 (×3): 2.4 g via ORAL
  Filled 2013-01-05 (×7): qty 1

## 2013-01-05 MED ORDER — SODIUM CHLORIDE 0.9 % IV SOLN: 250.0000 mL | INTRAVENOUS | Status: DC | PRN

## 2013-01-05 MED ORDER — METOCLOPRAMIDE HCL 5 MG/ML IJ SOLN
10.0000 mg | Freq: Once | INTRAMUSCULAR | Status: AC
  Administered 2013-01-05: 10 mg via INTRAMUSCULAR
  Filled 2013-01-05: qty 2

## 2013-01-05 MED ORDER — LORAZEPAM 2 MG/ML IJ SOLN
INTRAMUSCULAR | Status: AC
  Administered 2013-01-05: 1 mg via INTRAMUSCULAR
  Filled 2013-01-05: qty 1

## 2013-01-05 MED ORDER — ASPIRIN 81 MG PO CHEW: 324.0000 mg | CHEWABLE_TABLET | ORAL | Status: AC

## 2013-01-05 MED ORDER — FAMOTIDINE 20 MG PO TABS
20.0000 mg | ORAL_TABLET | Freq: Every day | ORAL | Status: DC
  Filled 2013-01-05: qty 1

## 2013-01-05 MED ORDER — CARVEDILOL 25 MG PO TABS
25.0000 mg | ORAL_TABLET | Freq: Two times a day (BID) | ORAL | Status: DC
  Administered 2013-01-06 – 2013-01-07 (×3): 25 mg via ORAL
  Filled 2013-01-05 (×5): qty 1

## 2013-01-05 MED ORDER — PROMETHAZINE HCL 25 MG/ML IJ SOLN: 25.0000 mg | Freq: Four times a day (QID) | INTRAMUSCULAR | Status: DC | PRN

## 2013-01-05 MED ORDER — CLONIDINE HCL 0.2 MG PO TABS
0.2000 mg | ORAL_TABLET | Freq: Two times a day (BID) | ORAL | Status: DC
  Filled 2013-01-05: qty 1

## 2013-01-05 MED ORDER — LISINOPRIL 20 MG PO TABS
20.0000 mg | ORAL_TABLET | Freq: Two times a day (BID) | ORAL | Status: DC
  Filled 2013-01-05: qty 1

## 2013-01-05 MED ORDER — AMLODIPINE BESYLATE 5 MG PO TABS
5.0000 mg | ORAL_TABLET | Freq: Two times a day (BID) | ORAL | Status: DC
  Filled 2013-01-05 (×3): qty 1

## 2013-01-05 MED ORDER — METOCLOPRAMIDE HCL 5 MG/ML IJ SOLN: 20.0000 mg | Freq: Once | INTRAVENOUS | Status: DC

## 2013-01-05 MED ORDER — LISINOPRIL 20 MG PO TABS
20.0000 mg | ORAL_TABLET | Freq: Two times a day (BID) | ORAL | Status: DC
  Administered 2013-01-06 – 2013-01-07 (×3): 20 mg via ORAL
  Filled 2013-01-05 (×5): qty 1

## 2013-01-05 MED ORDER — LORAZEPAM 2 MG/ML IJ SOLN
1.0000 mg | Freq: Once | INTRAMUSCULAR | Status: AC
  Administered 2013-01-05: 1 mg via INTRAMUSCULAR
  Filled 2013-01-05: qty 1

## 2013-01-05 MED ORDER — CINACALCET HCL 30 MG PO TABS
60.0000 mg | ORAL_TABLET | Freq: Every day | ORAL | Status: DC
  Administered 2013-01-06: 60 mg via ORAL
  Filled 2013-01-05 (×3): qty 2

## 2013-01-05 MED ORDER — PROMETHAZINE HCL 25 MG/ML IJ SOLN
25.0000 mg | Freq: Four times a day (QID) | INTRAMUSCULAR | Status: DC | PRN
  Administered 2013-01-05: 25 mg via INTRAMUSCULAR
  Filled 2013-01-05: qty 1

## 2013-01-05 MED ORDER — ACETAMINOPHEN 325 MG PO TABS
325.0000 mg | ORAL_TABLET | ORAL | Status: DC | PRN
  Administered 2013-01-06: 325 mg via ORAL

## 2013-01-05 MED ORDER — HEPARIN SODIUM (PORCINE) 5000 UNIT/ML IJ SOLN
5000.0000 [IU] | Freq: Three times a day (TID) | INTRAMUSCULAR | Status: DC
  Administered 2013-01-06 – 2013-01-07 (×3): 5000 [IU] via SUBCUTANEOUS
  Filled 2013-01-05 (×8): qty 1

## 2013-01-05 MED ORDER — MORPHINE SULFATE 2 MG/ML IJ SOLN
1.0000 mg | INTRAMUSCULAR | Status: DC | PRN
  Administered 2013-01-05 – 2013-01-07 (×3): 1 mg via INTRAVENOUS
  Filled 2013-01-05 (×3): qty 1

## 2013-01-05 MED ORDER — HYDRALAZINE HCL 20 MG/ML IJ SOLN
20.0000 mg | Freq: Once | INTRAMUSCULAR | Status: AC
  Administered 2013-01-05: 20 mg via INTRAMUSCULAR

## 2013-01-05 MED ORDER — CLONIDINE HCL 0.3 MG/24HR TD PTWK
0.3000 mg | MEDICATED_PATCH | TRANSDERMAL | Status: DC
  Administered 2013-01-05: 0.3 mg via TRANSDERMAL
  Filled 2013-01-05: qty 1

## 2013-01-05 MED ORDER — HYDRALAZINE HCL 20 MG/ML IJ SOLN: 20.0000 mg | Freq: Once | INTRAMUSCULAR | Status: DC

## 2013-01-05 MED ORDER — ASPIRIN 300 MG RE SUPP: 300.0000 mg | RECTAL | Status: AC

## 2013-01-05 NOTE — Progress Notes (Signed)
Headache after Hydralazine (the patient states that he always has a headache after taking hydralazine) BP 210/120. No chest pain;   Tylenol ordered. To initiate HD as soon as he is going to the ICU; HD will help with BP as well;

## 2013-01-05 NOTE — Progress Notes (Signed)
**Note De-Identified  Obfuscation** RT note: Rt notified to place patient on BIPAP; upon assessment patient was sitting on side of bed with n/v. SATS 94% on NRB, BBS clr/dim, RR 20, BP 200/105, and anxiety.  RT to continue to monitor for BIPAP.

## 2013-01-05 NOTE — Consult Note (Signed)
[name] 01/05/2013 [place] Requesting Physician:  Dr. Vanita Panda  Reason for Consult:  Dyspnea in ESRD pt HPI: The patient is a 42 y.o. year-old with ESRD on hemodialysis since 2006 with [facility] in [facility]. Presented today to ED with SOB less than 24 hrs duration. CXR with severe pulm edema changes. Did not tolerate BiPap, having some resp distress in ED. Vomiting associated with cough and retching.   Takes HD MWF and has not missed HD recently.  Has had greater po intake of liquids per pt due to recent hypoglycemia episodes. Was 13 lbs up at HD yesterday and they pulled of 10 lbs per pt. No problems with dialysis and has been taking his medications. Takes 4 BP meds and renvela as a binder.   ROS  no HA, sore throat  no CP  no fever  no sputum production  R knee pain , chronic arthritis  no itching or rash   Past Medical History:  Past Medical History  Diagnosis Date  . Renal insufficiency   . Hypertension   . Anemia   . Depression   . Blood transfusion   . CHF (congestive heart failure)     Past Surgical History:  Past Surgical History  Procedure Laterality Date  . Cholecystectomy    . Angioplasty    . Dialysis fistula creation    . Knee surgery    . Mouth surgery    . Nephrectomy transplanted organ      Family History:  Family History  Problem Relation Age of Onset  . Arthritis Mother   . Hypertension Mother   . Diabetes Mother   . Arthritis Father   . Hypertension Father   . Diabetes Father    Social History:  reports that he has never smoked. He does not have any smokeless tobacco history on file. He reports that he drinks about 0.5 ounces of alcohol per week. He reports that he does not use illicit drugs.  Allergies:  Allergies  Allergen Reactions  . Delacort (Hydrocortisone Base) Nausea Only  . Benicar (Olmesartan)     Unknown  . Food Itching    bananas  . Nitrates, Organic Other (See Comments)    Palpitations, headache    Home  medications: Prior to Admission medications   Medication Sig Start Date End Date Taking? Authorizing Provider  amLODipine (NORVASC) 5 MG tablet Take 1 tablet (5 mg total) by mouth 2 (two) times daily. 11/20/12  Yes [name], MD  carvedilol (COREG) 25 MG tablet Take 25 mg by mouth 2 (two) times daily with a meal.    Yes Historical Provider, MD  cinacalcet (SENSIPAR) 60 MG tablet Take 60 mg by mouth daily.   Yes Historical Provider, MD  cloNIDine (CATAPRES) 0.2 MG tablet Take 0.2 mg by mouth 2 (two) times daily.   Yes Historical Provider, MD  lisinopril (PRINIVIL,ZESTRIL) 20 MG tablet Take 20 mg by mouth 2 (two) times daily.    Yes Historical Provider, MD  ranitidine (ZANTAC) 150 MG capsule Take 150 mg by mouth every evening.     Yes Historical Provider, MD  Sevelamer Carbonate (RENVELA) 2.4 G PACK Take 2.4 g by mouth 3 (three) times daily.   Yes Historical Provider, MD    Labs: Basic Metabolic Panel:  Recent Labs Lab 01/05/13 1716  NA 128*  K 4.2  CL 91*  CO2 23  GLUCOSE 118*  BUN 34*  CREATININE 10.33*  CALCIUM 9.8   Liver Function Tests: No  results found for this basename: AST, ALT, ALKPHOS, BILITOT, PROT, ALBUMIN,  in the last 168 hours No results found for this basename: LIPASE, AMYLASE,  in the last 168 hours No results found for this basename: AMMONIA,  in the last 168 hours CBC:  Recent Labs Lab 01/05/13 1716  WBC 6.9  HGB 11.8*  HCT 35.8*  MCV 94.0  PLT 145*   PT/INR: @LABRCNTIP (inr:5) Cardiac Enzymes: )No results found for this basename: CKTOTAL, CKMB, CKMBINDEX, TROPONINI,  in the last 168 hours CBG:  Recent Labs Lab 01/05/13 1625  GLUCAP 108*    Physical Exam:  Blood pressure 210/120, pulse 98, temperature 97.9 F (36.6 C), temperature source Oral, resp. rate 28, SpO2 96.00%.  Gen: on face mask O2, uncomfortable, coughing and retching intermittently  HEENT:  EOMI, sclera anicteric, throat clear Neck: + JVD, no LAN Chest: bilat diffuse coarse  rales throughout the lungs CV: regular, no rub or gallop, cannot detect murmur Abdomen: soft, nontender, no HSM, no ascites Ext: 2+ LE edema, no joint effusion or deformity, no gangrene or ulceration Neuro: alert, Ox3, no focal deficit Access: L forearm fistula patent   Impression/Plan 1. Acute respiratory failure- severe pulm edema. Too unstable for dialysis upstairs, recommend admit to ICU for acute dialysis tonight and would ask CCM to see in case intubation required.  Did not tolerate bipap due to retching. Discussed with Dr. Karren Burly 2. ESRD, usual HD MWF in [facility] with [facility] since 2006 3. HTN/volume- 4 BP meds , vol control 4. Anemia of CKD- get OP records 5. HPTH- renvela as binder   Kelly Splinter  MD [employer] [phone] pgr    [phone] cell 01/05/2013, 7:53 PM

## 2013-01-05 NOTE — ED Notes (Signed)
After Pt was given the ordered dose of Hydralazine the PT reported he was Allergic to it. Pt reports sever HA after Taking med. DR Albon called to report . Tylenol ordered for HA

## 2013-01-05 NOTE — ED Notes (Signed)
Pt c/o blood sugar dropping, high bp, and chest pain that began en route. Pt states "it could just be the anxiety though."

## 2013-01-05 NOTE — ED Provider Notes (Signed)
History     CSN: [id number]  Arrival date & time 01/05/13  1616   First MD Initiated Contact with Patient 01/05/13 1649      Chief Complaint  Patient presents with  . Hypertension    (Consider location/radiation/quality/duration/timing/severity/associated sxs/prior treatment) HPI  The patient presents with dyspnea, fatigue.  He states that he completed dialysis yesterday, consultation.  Today, approximately 3 hours prior to arrival so after awakening from a nap the patient felt particularly dyspneic, fatigue, with chest tightness, but no focal pain.  No syncope, the patient endorses lightheadedness.  No relief with anything.  Symptoms prevent him from doing anything without significant dyspnea and fatigue. He states that he completed dialysis yesterday and was 3 pounds up.  Past Medical History  Diagnosis Date  . Renal insufficiency   . Hypertension   . Anemia   . Depression   . Blood transfusion   . CHF (congestive heart failure)     Past Surgical History  Procedure Laterality Date  . Cholecystectomy    . Angioplasty    . Dialysis fistula creation    . Knee surgery    . Mouth surgery    . Nephrectomy transplanted organ      Family History  Problem Relation Age of Onset  . Arthritis Mother   . Hypertension Mother   . Diabetes Mother   . Arthritis Father   . Hypertension Father   . Diabetes Father     History  Substance Use Topics  . Smoking status: Never Smoker   . Smokeless tobacco: Not on file  . Alcohol Use: 0.5 oz/week    1 drink(s) per week     Comment: drinks liquor once a month      Review of Systems  Constitutional:       Per HPI, otherwise negative  HENT:       Per HPI, otherwise negative  Respiratory:       Per HPI, otherwise negative  Cardiovascular:       Per HPI, otherwise negative  Gastrointestinal: Positive for nausea and vomiting. Negative for abdominal pain.  Endocrine:       Negative aside from HPI  Genitourinary:       Neg  aside from HPI   Musculoskeletal:       Per HPI, otherwise negative  Skin: Negative.   Neurological: Negative for syncope.    Allergies  Delacort; Food; and Nitrates, organic  Home Medications   Current Outpatient Rx  Name  Route  Sig  Dispense  Refill  . amLODipine (NORVASC) 5 MG tablet   Oral   Take 1 tablet (5 mg total) by mouth 2 (two) times daily.   60 tablet   11     DISREGARD FIRST REFILL REQUEST  ./CY   . carvedilol (COREG) 25 MG tablet   Oral   Take 25 mg by mouth 2 (two) times daily with a meal.          . cinacalcet (SENSIPAR) 60 MG tablet   Oral   Take 60 mg by mouth daily.         Marland Kitchen EXPIRED: clonazePAM (KLONOPIN) 1 MG tablet   Oral   Take 1 tablet (1 mg total) by mouth daily as needed for anxiety (prior to dialysis M, W, F).   12 tablet   0   . cloNIDine (CATAPRES) 0.2 MG tablet   Oral   Take 0.2 mg by mouth 2 (two) times daily.         Marland Kitchen  econazole nitrate 1 % cream   Topical   Apply 1 application topically as needed.          Marland Kitchen lisinopril (PRINIVIL,ZESTRIL) 20 MG tablet   Oral   Take 20 mg by mouth 2 (two) times daily.          . methyldopa (ALDOMET) 250 MG tablet   Oral   Take 1 tablet (250 mg total) by mouth 2 (two) times daily.   60 tablet   11   . ranitidine (ZANTAC) 150 MG capsule   Oral   Take 150 mg by mouth every evening.           . Sevelamer Carbonate (RENVELA) 2.4 G PACK   Oral   Take 2.4 g by mouth 3 (three) times daily.         . Vardenafil HCl (LEVITRA PO)   Oral   Take by mouth as directed.           BP 239/137  Pulse 99  Temp(Src) 97.9 F (36.6 C) (Oral)  Resp 28  SpO2 92%  Physical Exam  Nursing note and vitals reviewed. Constitutional:  Very uncomfortable appearing diaphoretic male retching  HENT:  Head: Normocephalic and atraumatic.  Eyes: Conjunctivae are normal. Right eye exhibits no discharge. Left eye exhibits no discharge.  Neck: No tracheal deviation present.  Cardiovascular:  Regular rhythm.  Tachycardia present.   Pulmonary/Chest: No stridor. He has decreased breath sounds. He has rales.  Musculoskeletal:       Arms:   ED Course  Procedures (including critical care time)  Labs Reviewed  CBC - Abnormal; Notable for the following:    RBC 3.81 (*)    Hemoglobin 11.8 (*)    HCT 35.8 (*)    Platelets 145 (*)    All other components within normal limits  BASIC METABOLIC PANEL - Abnormal; Notable for the following:    Sodium 128 (*)    Chloride 91 (*)    Glucose, Bld 118 (*)    BUN 34 (*)    Creatinine, Ser 10.33 (*)    GFR calc non Af Amer 6 (*)    GFR calc Af Amer 6 (*)    All other components within normal limits  PRO B NATRIURETIC PEPTIDE - Abnormal; Notable for the following:    Pro B Natriuretic peptide (BNP) 19674.0 (*)    All other components within normal limits  GLUCOSE, CAPILLARY - Abnormal; Notable for the following:    Glucose-Capillary 108 (*)    All other components within normal limits  POCT I-STAT TROPONIN I   No results found.   No diagnosis found.  Pulse ox 88% on Ventimask abnormal Cardiac 90 sinus rhythm normal   Date: 01/05/2013  Rate: 89  Rhythm: normal sinus rhythm  QRS Axis: left  Intervals: normal  ST/T Wave abnormalities: nonspecific ST/T changes  Conduction Disutrbances:none  Narrative Interpretation:   Old EKG Reviewed: changes noted Peaked T waves in the lateral distribution, faster rate. Abnormal  After initial presentation with significant anxiety, diaphoresis, dyspnea, the patient received Ativan, Reglan IM, with some improvement.  Initial attempts to secure chest x-ray were unsuccessful due to the patient's anxiety.  Following additional Ativan, supplemental oxygen the patient was able to have x-ray.  This demonstrated significant pulmonary congestion.  The patient will receive a for elevated BNP.  The patient has normal potassium, there are concerning changes on x-ray.    The patient has been  intolerant of BiPAP to 2  persistent retching.  With the x-ray demonstrating congestion I discussed his case with our nephrologist.  The patient will require ICU admission for hemodialysis care.  The patient continues to be borderline hypoxic, with oxygen saturation below 90 with a Ventimask.  His heart rate has improved.  Blood pressure is marginally improved.   MDM  This patient with multiple medical problems, including end-stage renal disease, dialysis yesterday and now presents with dyspnea, chest pain.  On exam the patient is hypoxic,, tachycardic.  The patient is also retching, very uncomfortable appearing.  With the hypertension, his dialysis, and is concern for pulmonary edema and/or consolidation.  The patient's labs demonstrate hyponatremia, significant elevation of his BNP.  The patient's x-ray is notable for significant opacification concerning for congestion/edema.  The patient received additional antihypertensives, supplemental oxygen, anxiolytics, antiemetics in the emergency department.  He required emergent dialysis, and placement in the ICU for this.  CRITICAL CARE Performed by: Carmin Muskrat   Total critical care time: 40  Critical care time was exclusive of separately billable procedures and treating other patients.  Critical care was necessary to treat or prevent imminent or life-threatening deterioration.  Critical care was time spent personally by me on the following activities: development of treatment plan with patient and/or surrogate as well as nursing, discussions with consultants, evaluation of patient's response to treatment, examination of patient, obtaining history from patient or surrogate, ordering and performing treatments and interventions, ordering and review of laboratory studies, ordering and review of radiographic studies, pulse oximetry and re-evaluation of patient's condition.         Carmin Muskrat, MD 01/05/13 2013

## 2013-01-05 NOTE — Progress Notes (Signed)
Headache and nausea in context of fluid overload, HTN   Phenergan iv ordered; morphine ordered for pain; clonidine patch now placed;

## 2013-01-05 NOTE — ED Notes (Addendum)
Xray called for portable chest

## 2013-01-05 NOTE — ED Notes (Signed)
Pt placed on [place] 3L, spo2-92%

## 2013-01-05 NOTE — ED Notes (Signed)
Per ems: pt called ems stating he feels like his blood sugar "has been dropping today." CBG 70 on arrival, BP was high 220/130, pt states he has been taking BP meds as instructed. HD patient, last full dialysis yesterday. On arrival to ed triage began to c/o CP but did not report any pain en route

## 2013-01-05 NOTE — ED Notes (Signed)
On arrival to CDU 12 Pt agitated not following directions .

## 2013-01-06 ENCOUNTER — Inpatient Hospital Stay (HOSPITAL_COMMUNITY): Payer: Medicare Other

## 2013-01-06 ENCOUNTER — Encounter (HOSPITAL_COMMUNITY): Payer: Self-pay | Admitting: *Deleted

## 2013-01-06 DIAGNOSIS — J9691 Respiratory failure, unspecified with hypoxia: Secondary | ICD-10-CM

## 2013-01-06 DIAGNOSIS — J811 Chronic pulmonary edema: Secondary | ICD-10-CM

## 2013-01-06 DIAGNOSIS — I161 Hypertensive emergency: Secondary | ICD-10-CM

## 2013-01-06 HISTORY — DX: Respiratory failure, unspecified with hypoxia: J96.91

## 2013-01-06 HISTORY — DX: Hypertensive emergency: I16.1

## 2013-01-06 HISTORY — DX: Chronic pulmonary edema: J81.1

## 2013-01-06 LAB — GLUCOSE, CAPILLARY: Glucose-Capillary: 126 mg/dL — ABNORMAL HIGH (ref 70–99)

## 2013-01-06 LAB — CBC
HCT: 32.5 % — ABNORMAL LOW (ref 39.0–52.0)
MCH: 30.5 pg (ref 26.0–34.0)
MCHC: 32.6 g/dL (ref 30.0–36.0)
MCV: 93.4 fL (ref 78.0–100.0)
Platelets: 120 10*3/uL — ABNORMAL LOW (ref 150–400)
RDW: 13.7 % (ref 11.5–15.5)

## 2013-01-06 LAB — BASIC METABOLIC PANEL
BUN: 20 mg/dL (ref 6–23)
Calcium: 9.6 mg/dL (ref 8.4–10.5)
Chloride: 99 mEq/L (ref 96–112)
Creatinine, Ser: 7.38 mg/dL — ABNORMAL HIGH (ref 0.50–1.35)
GFR calc Af Amer: 10 mL/min — ABNORMAL LOW (ref >90)
GFR calc non Af Amer: 8 mL/min — ABNORMAL LOW (ref >90)

## 2013-01-06 LAB — HEPATITIS B SURFACE ANTIGEN: Hepatitis B Surface Ag: NEGATIVE

## 2013-01-06 MED ORDER — SODIUM CHLORIDE 0.9 % IV SOLN: 100.0000 mL | INTRAVENOUS | Status: DC | PRN

## 2013-01-06 MED ORDER — NEPRO/CARBSTEADY PO LIQD
237.0000 mL | ORAL | Status: DC | PRN
  Filled 2013-01-06: qty 237

## 2013-01-06 MED ORDER — AMLODIPINE BESYLATE 10 MG PO TABS
10.0000 mg | ORAL_TABLET | Freq: Every day | ORAL | Status: DC
  Administered 2013-01-06 – 2013-01-07 (×2): 10 mg via ORAL
  Filled 2013-01-06 (×2): qty 1

## 2013-01-06 NOTE — Progress Notes (Addendum)
Name: [name] MRN: [id number] DOB: 1970-11-10    LOS: 1  Referring Provider:  Dr. Vanita Panda Reason for Referral:  Hypoxic respiratory failure  PULMONARY / CRITICAL CARE MEDICINE  HPI:  Mr. [name] is a 42 year old man with dialysis dependent, end-stage renal disease of unknown etiology who presented to [name] on the night of March 1 complaining of dyspnea and hypertensive emergency..  CXR showed pulmonary edema  He reports that he woke up from not short of breath.  He attended dialysis 3-1 where he was noted to be 13 pounds over his dry weight. He was drinking sodas to avoid hypoglycemia x 1 wk  Events Since Admission: Admitted to ICU for emergent hemodialysis  Current Status: Improved breathing , denies CP Vital Signs: Temp:  [97.2 F (36.2 C)-98.8 F (37.1 C)] 98.3 F (36.8 C) (03/02 0756) Pulse Rate:  [86-115] 92 (03/02 0800) Resp:  [27-48] 33 (03/02 0800) BP: (155-239)/(88-143) 159/90 mmHg (03/02 0800) SpO2:  [87 %-100 %] 97 % (03/02 0800) FiO2 (%):  [50 %] 50 % (03/02 0751) Weight:  [96.4 kg (212 lb 8.4 oz)-102.5 kg (225 lb 15.5 oz)] 96.4 kg (212 lb 8.4 oz) (03/02 0228)  Physical Examination: General:  Appears older than stated age, on venti mask but not sob Neuro:  Alert and oriented x3, cranial nerves II through XII are grossly intact  HEENT:  Pupils equally round and reactive to light, extraocular muscles intact, oropharynx clear Neck:  Supple, no masses, positive JVD Cardiovascular:  Normal rate regular rhythm, 2/6 systolic murmur heard best at left upper to order Lungs:  Bilateral crackles, good air movement throughout Abdomen:  Soft nontender nondistended, positive bowel sounds, no palpable HSM Musculoskeletal:  No joint abnormalities, AV fistula with good thrill and left forearm Skin:  No rashes bruises or lesions  Principal Problem:   Respiratory failure with hypoxia Active Problems:   Pulmonary edema   Hypertensive emergency  Dg Chest Port 1  View  01/06/2013  *RADIOLOGY REPORT*  Clinical Data: Acute respiratory distress.  Shortness of breath, vomiting.  PORTABLE CHEST - 1 VIEW  Comparison: 01/05/2013  Findings: Marked enlargement of the cardiopericardial silhouette. Given the configuration, I cannot exclude pericardial effusion. Bilateral airspace disease, left greater than right, likely multifocal pneumonia.  There is some improvement in the lower lobes bilaterally.  No definite effusions.  IMPRESSION: Continued bilateral airspace disease, left greater than right with some improvement in the lower lobes.  Marked enlargement of the cardiopericardial silhouette.  Given the appearance, cannot exclude pericardial effusion.   Original Report Authenticated By: Rolm Baptise, M.D.    Dg Chest Portable 1 View  01/05/2013  *RADIOLOGY REPORT*  Clinical Data: Shortness of breath, hypertension  PORTABLE CHEST - 1 VIEW  Comparison: 02/18/2011  Findings: Near complete opacification of the left lung.  Additional patchy opacity in the right lower lobe.  Differential considerations include multifocal pneumonia or moderate interstitial edema.  No pneumothorax.  Cardiomegaly.  IMPRESSION: Near complete opacification of the left lung with additional patchy opacity in the right lower lobe, possibly reflecting multifocal pneumonia or moderate interstitial edema.  Cardiomegaly.   Original Report Authenticated By: Julian Hy, M.D.     ASSESSMENT AND PLAN  PULMONARY No results found for this basename: PHART, PCO2, PCO2ART, PO2ART, HCO3, O2SAT,  in the last 168 hours Ventilator Settings: Vent Mode:  [-]  FiO2 (%):  [50 %] 50 %  ETT:  None  A:  Hypoxic respiratory failure in the setting of hypertensive emergency and  pulmonary edema P:   Supplement oxygen with venti- mask Of note patient has had abnormal imaging of posterior lung bases on abdominal CTs previously.  The etiology of his renal failure is unknown.  Could consider chest CT when euvolemic to  further evaluate lung parenchyma. -wean O2 as tolerated  CARDIOVASCULAR  Recent Labs Lab 01/05/13 1721  PROBNP 19674.0*   ECG:  Normal sinus rhythm Lines: Peripheral IV, left forearm av graft patent   A: Hypertensive emergency P:  Started on clonidine patch and given IV hydralazine -changeto PO clonidine in 24h Resume coreg, lisinopril  & norvasc Hemodialysis initiated in the intensive care unit Chk echo for effusion  RENAL  Recent Labs Lab 01/05/13 1716 01/05/13 2019 01/06/13 0605  NA 128*  --  138  K 4.2  --  4.0  CL 91*  --  99  CO2 23  --  30  BUN 34*  --  20  CREATININE 10.33* 10.93* 7.38*  CALCIUM 9.8  --  9.6   Intake/Output     03/01 0701 - 03/02 0700 03/02 0701 - 03/03 0700   Other 5000    Total Output 5000     Net -5000           Foley:  None  A:  End-stage renal disease-unknown etiology, dialysis dependent P:   Hemodialysis -may need to adjust dry wt  GASTROINTESTINAL No results found for this basename: AST, ALT, ALKPHOS, BILITOT, PROT, ALBUMIN,  in the last 168 hours  A:  Nausea and vomiting P:   Related to hypertension IV antiemetics  HEMATOLOGIC  Recent Labs Lab 01/05/13 1716 01/06/13 0605  HGB 11.8* 10.6*  HCT 35.8* 32.5*  PLT 145* 120*   A:  Anemia P:  Likely related to chronic kidney disease  INFECTIOUS  Recent Labs Lab 01/05/13 1716 01/06/13 0605  WBC 6.9 6.8   Cultures: None Antibiotics: None  A:  Monitor for signs and symptoms of infection P:     ENDOCRINE  Recent Labs Lab 01/05/13 1625 01/05/13 2159  GLUCAP 108* 126*   A:  hypoglycemia P:  CBg chks Nutrition input   NEUROLOGIC  A:  Headache P:   Pain control with Tylenol   Global: may be ready to go out of icu 3-3  BEST PRACTICE / DISPOSITION Level of Care:  ICU Primary Service:  PC CM Consultants:  Nephrology Code Status:  Full DVT Px:  Heparin GI Px:  PPI Skin Integrity:  Good Social / Family:      Richardson Landry Minor ACNP [place] Pager [phone] till 3 pm If no answer page [phone]  Care during the described time interval was provided by me and/or other providers on the critical care team.  I have reviewed this patient's available data, including medical history, events of note, physical examination and test results as part of my evaluation  ALVA,RAKESH V.  01/06/2013, 8:51 AM

## 2013-01-06 NOTE — Progress Notes (Signed)
Subjective: 5 kg off with HD yest, BP down to 150/90. On nasal cannula now, CXR with improvement in lower lobes but still very abnormal  Objective Vital signs in last 24 hours: Filed Vitals:   01/06/13 0600 01/06/13 0700 01/06/13 0756 01/06/13 0800  BP: 159/91 155/88  159/90  Pulse: 90 86  92  Temp:   98.3 F (36.8 C)   TempSrc:   Oral   Resp: 31 31  33  Height:      Weight:      SpO2: 100% 100%  97%   Weight change:   Intake/Output Summary (Last 24 hours) at 01/06/13 1026 Last data filed at 01/06/13 0228  Gross per 24 hour  Intake      0 ml  Output   5000 ml  Net  -5000 ml   Labs: Basic Metabolic Panel:  Recent Labs Lab 01/05/13 1716 01/05/13 2019 01/06/13 0605  NA 128*  --  138  K 4.2  --  4.0  CL 91*  --  99  CO2 23  --  30  GLUCOSE 118*  --  89  BUN 34*  --  20  CREATININE 10.33* 10.93* 7.38*  CALCIUM 9.8  --  9.6   Liver Function Tests: No results found for this basename: AST, ALT, ALKPHOS, BILITOT, PROT, ALBUMIN,  in the last 168 hours No results found for this basename: LIPASE, AMYLASE,  in the last 168 hours No results found for this basename: AMMONIA,  in the last 168 hours CBC:  Recent Labs Lab 01/05/13 1716 01/06/13 0605  WBC 6.9 6.8  HGB 11.8* 10.6*  HCT 35.8* 32.5*  MCV 94.0 93.4  PLT 145* 120*   PT/INR: @LABRCNTIP (inr:5)  . amLODipine  10 mg Oral Daily  . aspirin  324 mg Oral NOW   Or  . aspirin  300 mg Rectal NOW  . carvedilol  25 mg Oral BID WC  . cinacalcet  60 mg Oral Q breakfast  . cloNIDine  0.3 mg Transdermal Weekly  . famotidine  20 mg Oral QHS  . heparin  5,000 Units Subcutaneous Q8H  . lisinopril  20 mg Oral BID  . sevelamer carbonate  2.4 g Oral TID WC    Physical Exam:  Blood pressure 159/90, pulse 92, temperature 98.3 F (36.8 C), temperature source Oral, resp. rate 33, height 6\' 3"  (1.905 m), weight 96.4 kg (212 lb 8.4 oz), SpO2 97.00%.  Gen:  Not in distress today, calm, alert HEENT: EOMI, sclera anicteric,  throat clear  Neck: + JVD, no LAN  Chest: still has scattered rales at bases CV: regular, no rub or gallop Abdomen: soft, nontender, no HSM, no ascites  Ext: 1+ LE edema bilat Neuro: alert, Ox3, no focal deficit  Access: L forearm fistula patent   Impression/Plan  1. Acute respiratory failure / severe pulm edema- improving, on nasal cannula. Still vol overloaded with persistent vol excess.  Still +edema by xray today. HD today and again tomorrow to keep on schedule. May be ready for d/c tomorrow with new lowered dry wt 2. ESRD, usual HD MWF in [facility] with [facility] since 2006 3. HTN/volume- 4 BP meds , vol control 4. Anemia of CKD- get OP records 5. Reading- renvela as binder   Kelly Splinter  MD [phone] pgr    [phone] cell 01/06/2013, 10:26 AM

## 2013-01-06 NOTE — H&P (Signed)
Name: [name] MRN: [id number] DOB: 1971-03-15    LOS: 1  Referring Provider:  Dr. Vanita Panda Reason for Referral:  Hypoxic respiratory failure  PULMONARY / CRITICAL CARE MEDICINE  HPI:  [name] is a 42 year old man with dialysis dependent, end-stage renal disease of unknown etiology who presented to [name] on the night of March 1 complaining of dyspnea.  He reports that he woke up from not short of breath.  He attended dialysis yesterday where he was noted to be 13 pounds over his dry weight. Past Medical History  Diagnosis Date  . Hypertension   . Anemia   . Depression   . Blood transfusion   . CHF (congestive heart failure)   . ESRD on hemodialysis 07/13/2011    Started hemodialysis in 2006. Gets HD MWF in [facility] with [facility]. L forearm AVF.     Past Surgical History  Procedure Laterality Date  . Cholecystectomy    . Angioplasty    . Dialysis fistula creation    . Knee surgery    . Mouth surgery    . Nephrectomy transplanted organ     Prior to Admission medications   Medication Sig Start Date End Date Taking? Authorizing Provider  amLODipine (NORVASC) 5 MG tablet Take 1 tablet (5 mg total) by mouth 2 (two) times daily. 11/20/12  Yes [name], MD  carvedilol (COREG) 25 MG tablet Take 25 mg by mouth 2 (two) times daily with a meal.    Yes Historical Provider, MD  cinacalcet (SENSIPAR) 60 MG tablet Take 60 mg by mouth daily.   Yes Historical Provider, MD  cloNIDine (CATAPRES) 0.2 MG tablet Take 0.2 mg by mouth 2 (two) times daily.   Yes Historical Provider, MD  lisinopril (PRINIVIL,ZESTRIL) 20 MG tablet Take 20 mg by mouth 2 (two) times daily.    Yes Historical Provider, MD  ranitidine (ZANTAC) 150 MG capsule Take 150 mg by mouth every evening.     Yes Historical Provider, MD  Sevelamer Carbonate (RENVELA) 2.4 G PACK Take 2.4 g by mouth 3 (three) times daily.   Yes Historical Provider, MD   Allergies Allergies  Allergen Reactions  . Delacort  (Hydrocortisone Base) Nausea Only  . Benicar (Olmesartan)     Unknown  . Food Itching    bananas  . Hydralazine     Head ache  . Nitrates, Organic Other (See Comments)    Palpitations, headache    Family History Family History  Problem Relation Age of Onset  . Arthritis Mother   . Hypertension Mother   . Diabetes Mother   . Arthritis Father   . Hypertension Father   . Diabetes Father    Social History  reports that he has never smoked. He does not have any smokeless tobacco history on file. He reports that he drinks about 0.5 ounces of alcohol per week. He reports that he does not use illicit drugs.  Review Of Systems:  A full review of systems was obtained was negative except as stated in the history of present illness  Brief patient description:  42 year-old male with end-stage renal disease on dialysis presented with pulmonary edema and hypertensive emergency.  Events Since Admission: Admitted to ICU for emergent hemodialysis  Current Status: Guarded Vital Signs: Temp:  [97.2 F (36.2 C)-97.9 F (36.6 C)] 97.2 F (36.2 C) (03/02 0030) Pulse Rate:  [87-115] 96 (03/02 0115) Resp:  [27-48] 34 (03/02 0115) BP: (160-239)/(93-143) 180/96 mmHg (03/02 0115) SpO2:  [  87 %-100 %] 100 % (03/02 0115) Weight:  [102.5 kg (225 lb 15.5 oz)] 102.5 kg (225 lb 15.5 oz) (03/01 2100)  Physical Examination: General:  Appears older than stated age, sitting on side of bed. Neuro:  Alert and oriented x3, cranial nerves II through XII are grossly intact  HEENT:  Pupils equally round and reactive to light, extraocular muscles intact, oropharynx clear Neck:  Supple, no masses, positive JVD Cardiovascular:  Normal rate regular rhythm, 2/6 systolic murmur heard best at left upper to order Lungs:  Bilateral crackles, good air movement throughout Abdomen:  Soft nontender nondistended, positive bowel sounds, no palpable HSM Musculoskeletal:  No joint abnormalities, AV fistula with good thrill  and left forearm Skin:  No rashes bruises or lesions  Principal Problem:   Respiratory failure with hypoxia Active Problems:   Pulmonary edema   Hypertensive emergency   ASSESSMENT AND PLAN  PULMONARY No results found for this basename: PHART, PCO2, PCO2ART, PO2ART, HCO3, O2SAT,  in the last 168 hours Ventilator Settings:   CXR:  Pulmonary edema, severe, cannot rule out left-sided pleural effusion ETT:  None  A:  Hypoxic respiratory failure in the setting of hypertensive emergency and pulmonary edema P:   Supplement oxygen with nonrebreather mask Patient has severe anxiety and will not tolerate BiPAP May need to be intubated if oxygen requiring continues to worsen Of note patient has had abnormal imaging of posterior lung bases on abdominal CTs previously.  The etiology of his renal failure is unknown.  Could consider chest CT when euvolemic to further evaluate lung parenchyma  CARDIOVASCULAR  Recent Labs Lab 01/05/13 1721  PROBNP 19674.0*   ECG:  Normal sinus rhythm Lines: Peripheral IV  A: Hypertensive emergency P:  Started on clonidine patch and given IV hydralazine Hemodialysis initiated in the intensive care unit  RENAL  Recent Labs Lab 01/05/13 1716 01/05/13 2019  NA 128*  --   K 4.2  --   CL 91*  --   CO2 23  --   BUN 34*  --   CREATININE 10.33* 10.93*  CALCIUM 9.8  --    Intake/Output   None    Foley:  None  A:  End-stage renal disease-unknown etiology, dialysis dependent P:   Hemodialysis initiated in the ICU  GASTROINTESTINAL No results found for this basename: AST, ALT, ALKPHOS, BILITOT, PROT, ALBUMIN,  in the last 168 hours  A:  Nausea and vomiting P:   Related to hypertension IV antiemetics  HEMATOLOGIC  Recent Labs Lab 01/05/13 1716  HGB 11.8*  HCT 35.8*  PLT 145*   A:  Anemia P:  Likely related to chronic kidney disease  INFECTIOUS  Recent Labs Lab 01/05/13 1716  WBC 6.9    Cultures: None Antibiotics: None  A:  Monitor for signs and symptoms of infection P:     ENDOCRINE  Recent Labs Lab 01/05/13 1625  GLUCAP 108*   A:  No current problems   P:     NEUROLOGIC  A:  Headache P:   Pain control with Tylenol  BEST PRACTICE / DISPOSITION Level of Care:  ICU Primary Service:  PC CM Consultants:  Nephrology Code Status:  Full Diet:  N.p.o. for now DVT Px:  Heparin GI Px:  PPI Skin Integrity:  Good Social / Family:   Aunt at bedside  I spent 45 minutes of critical care time in the care of this patient separate from procedures which documented elsewhere Guy Begin., M.D. Pulmonary and Critical  [place] Pager: [phone]   01/05/2013, 9:00 PM

## 2013-01-07 ENCOUNTER — Inpatient Hospital Stay (HOSPITAL_COMMUNITY): Payer: Medicare Other

## 2013-01-07 ENCOUNTER — Emergency Department (HOSPITAL_COMMUNITY)
Admission: EM | Admit: 2013-01-07 | Discharge: 2013-01-08 | Disposition: A | Discharge status: Home / Self Care | Payer: Medicare Other | Attending: Emergency Medicine | Admitting: Emergency Medicine

## 2013-01-07 ENCOUNTER — Encounter (HOSPITAL_COMMUNITY): Payer: Self-pay | Admitting: *Deleted

## 2013-01-07 DIAGNOSIS — I369 Nonrheumatic tricuspid valve disorder, unspecified: Secondary | ICD-10-CM

## 2013-01-07 DIAGNOSIS — Z862 Personal history of diseases of the blood and blood-forming organs and certain disorders involving the immune mechanism: Secondary | ICD-10-CM | POA: Insufficient documentation

## 2013-01-07 DIAGNOSIS — E8779 Other fluid overload: Secondary | ICD-10-CM | POA: Insufficient documentation

## 2013-01-07 DIAGNOSIS — F3289 Other specified depressive episodes: Secondary | ICD-10-CM | POA: Insufficient documentation

## 2013-01-07 DIAGNOSIS — N186 End stage renal disease: Secondary | ICD-10-CM | POA: Insufficient documentation

## 2013-01-07 DIAGNOSIS — J811 Chronic pulmonary edema: Secondary | ICD-10-CM | POA: Insufficient documentation

## 2013-01-07 DIAGNOSIS — IMO0002 Reserved for concepts with insufficient information to code with codable children: Secondary | ICD-10-CM | POA: Insufficient documentation

## 2013-01-07 DIAGNOSIS — Z79899 Other long term (current) drug therapy: Secondary | ICD-10-CM | POA: Insufficient documentation

## 2013-01-07 DIAGNOSIS — I509 Heart failure, unspecified: Secondary | ICD-10-CM | POA: Insufficient documentation

## 2013-01-07 DIAGNOSIS — Z9861 Coronary angioplasty status: Secondary | ICD-10-CM | POA: Insufficient documentation

## 2013-01-07 DIAGNOSIS — R000 Tachycardia, unspecified: Secondary | ICD-10-CM | POA: Insufficient documentation

## 2013-01-07 DIAGNOSIS — R0989 Other specified symptoms and signs involving the circulatory and respiratory systems: Secondary | ICD-10-CM | POA: Insufficient documentation

## 2013-01-07 DIAGNOSIS — R0609 Other forms of dyspnea: Secondary | ICD-10-CM | POA: Insufficient documentation

## 2013-01-07 DIAGNOSIS — R0902 Hypoxemia: Secondary | ICD-10-CM | POA: Insufficient documentation

## 2013-01-07 DIAGNOSIS — I120 Hypertensive chronic kidney disease with stage 5 chronic kidney disease or end stage renal disease: Secondary | ICD-10-CM | POA: Insufficient documentation

## 2013-01-07 DIAGNOSIS — Z992 Dependence on renal dialysis: Secondary | ICD-10-CM | POA: Insufficient documentation

## 2013-01-07 LAB — BASIC METABOLIC PANEL
CO2: 28 mEq/L (ref 19–32)
Calcium: 9.7 mg/dL (ref 8.4–10.5)
Glucose, Bld: 83 mg/dL (ref 70–99)
Sodium: 136 mEq/L (ref 135–145)

## 2013-01-07 LAB — CBC
MCH: 31.2 pg (ref 26.0–34.0)
MCHC: 32.9 g/dL (ref 30.0–36.0)
MCV: 94.7 fL (ref 78.0–100.0)
Platelets: 107 10*3/uL — ABNORMAL LOW (ref 150–400)
RBC: 3.21 MIL/uL — ABNORMAL LOW (ref 4.22–5.81)
RDW: 13.8 % (ref 11.5–15.5)

## 2013-01-07 MED ORDER — PENTAFLUOROPROP-TETRAFLUOROETH EX AERO: 1.0000 "application " | INHALATION_SPRAY | CUTANEOUS | Status: DC | PRN

## 2013-01-07 MED ORDER — LIDOCAINE-PRILOCAINE 2.5-2.5 % EX CREA: 1.0000 "application " | TOPICAL_CREAM | CUTANEOUS | Status: DC | PRN

## 2013-01-07 MED ORDER — CLONIDINE HCL 0.2 MG PO TABS: 0.3000 mg | ORAL_TABLET | Freq: Two times a day (BID) | ORAL | Status: DC

## 2013-01-07 MED ORDER — LIDOCAINE HCL (PF) 1 % IJ SOLN: 5.0000 mL | INTRAMUSCULAR | Status: DC | PRN

## 2013-01-07 MED ORDER — ARIPIPRAZOLE 5 MG PO TABS
5.0000 mg | ORAL_TABLET | Freq: Once | ORAL | Status: AC
  Administered 2013-01-07: 5 mg via ORAL
  Filled 2013-01-07 (×2): qty 1

## 2013-01-07 MED ORDER — CLONAZEPAM 0.5 MG PO TABS: 1.0000 mg | ORAL_TABLET | Freq: Two times a day (BID) | ORAL | Status: DC | PRN

## 2013-01-07 MED ORDER — FAMOTIDINE 20 MG PO TABS
20.0000 mg | ORAL_TABLET | Freq: Two times a day (BID) | ORAL | Status: DC
  Administered 2013-01-07 – 2013-01-08 (×2): 20 mg via ORAL
  Filled 2013-01-07 (×2): qty 1

## 2013-01-07 MED ORDER — ALTEPLASE 2 MG IJ SOLR: 2.0000 mg | Freq: Once | INTRAMUSCULAR | Status: DC | PRN

## 2013-01-07 MED ORDER — CLONIDINE HCL 0.2 MG PO TABS
0.3000 mg | ORAL_TABLET | Freq: Two times a day (BID) | ORAL | Status: DC
  Administered 2013-01-08 (×2): 0.3 mg via ORAL
  Filled 2013-01-07 (×2): qty 1

## 2013-01-07 MED ORDER — CARVEDILOL 25 MG PO TABS
25.0000 mg | ORAL_TABLET | Freq: Two times a day (BID) | ORAL | Status: DC
  Administered 2013-01-08: 25 mg via ORAL
  Filled 2013-01-07 (×3): qty 1

## 2013-01-07 MED ORDER — CLONIDINE HCL 0.3 MG PO TABS: 0.3000 mg | ORAL_TABLET | Freq: Two times a day (BID) | ORAL | Status: DC

## 2013-01-07 MED ORDER — SEVELAMER CARBONATE 2.4 G PO PACK
2.4000 g | PACK | Freq: Three times a day (TID) | ORAL | Status: DC
  Administered 2013-01-08: 2.4 g via ORAL
  Filled 2013-01-07 (×4): qty 1

## 2013-01-07 MED ORDER — NEPRO/CARBSTEADY PO LIQD: 237.0000 mL | ORAL | Status: DC | PRN

## 2013-01-07 MED ORDER — SODIUM CHLORIDE 0.9 % IV SOLN: 100.0000 mL | INTRAVENOUS | Status: DC | PRN

## 2013-01-07 MED ORDER — ACETAMINOPHEN 325 MG PO TABS: 650.0000 mg | ORAL_TABLET | ORAL | Status: DC | PRN

## 2013-01-07 MED ORDER — HEPARIN SODIUM (PORCINE) 1000 UNIT/ML DIALYSIS: 2000.0000 [IU] | INTRAMUSCULAR | Status: DC | PRN

## 2013-01-07 MED ORDER — HEPARIN SODIUM (PORCINE) 1000 UNIT/ML DIALYSIS: 1000.0000 [IU] | INTRAMUSCULAR | Status: DC | PRN

## 2013-01-07 MED ORDER — AMLODIPINE BESYLATE 5 MG PO TABS
5.0000 mg | ORAL_TABLET | Freq: Two times a day (BID) | ORAL | Status: DC
  Administered 2013-01-08 (×2): 5 mg via ORAL
  Filled 2013-01-07 (×4): qty 1

## 2013-01-07 MED ORDER — CLONIDINE HCL 0.2 MG PO TABS: 0.3000 mg | ORAL_TABLET | Freq: Three times a day (TID) | ORAL | Status: DC | PRN

## 2013-01-07 MED ORDER — ARIPIPRAZOLE 5 MG PO TABS
5.0000 mg | ORAL_TABLET | Freq: Every day | ORAL | Status: DC
  Administered 2013-01-08: 5 mg via ORAL
  Filled 2013-01-07: qty 1

## 2013-01-07 MED ORDER — CLONAZEPAM 0.5 MG PO TABS
1.0000 mg | ORAL_TABLET | Freq: Every day | ORAL | Status: DC
  Administered 2013-01-07 – 2013-01-08 (×2): 1 mg via ORAL
  Filled 2013-01-07 (×2): qty 2

## 2013-01-07 MED ORDER — CINACALCET HCL 30 MG PO TABS
60.0000 mg | ORAL_TABLET | Freq: Every day | ORAL | Status: DC
  Filled 2013-01-07: qty 2

## 2013-01-07 MED ORDER — CLONIDINE HCL 0.3 MG PO TABS
0.3000 mg | ORAL_TABLET | ORAL | Status: AC
  Administered 2013-01-07: 0.3 mg via ORAL
  Filled 2013-01-07: qty 1

## 2013-01-07 MED ORDER — LISINOPRIL 20 MG PO TABS
20.0000 mg | ORAL_TABLET | Freq: Two times a day (BID) | ORAL | Status: DC
  Administered 2013-01-07 – 2013-01-08 (×2): 20 mg via ORAL
  Filled 2013-01-07 (×2): qty 1

## 2013-01-07 NOTE — ED Notes (Addendum)
D/c'd from ICU tonight at 1700. Was admitted for htn, ESRD, resp failure. Also h/o Bipolar.  Was d/c'd and had a panic attack at home. Usually takes klonopin, but did not take it tonight. Pt became anxious and aggitated. I usually deal with rage & get compulsive. Denies SI or HI.  Pt did not want to get worse, so he called 911. [facility] took pt to [facility]. [facility] wanted pt to be seen here in ED d/t pt's h/o ESRD and HD. Pt was dialyzed this afternoon. Pt "here for anxiety, just need to get thru the night",  "feel OK physically". Pt seen by Dr. Vanita Panda, EDP  in triage. Plan developed. Pending orders. Pt alert, calm, polite, cooperative, NAD, interactive, following commands.  Usual HD days are MWF, ([facility] location on [address]) access site [place].

## 2013-01-07 NOTE — Clinical Documentation Improvement (Signed)
Hypertension Documentation Clarification Query  THIS DOCUMENT IS NOT A PERMANENT PART OF THE MEDICAL RECORD        01/07/13  Dear [name],  In an effort to better capture your patient's severity of illness, reflect appropriate length of stay and utilization of resources, a review of the patient medical record has revealed the following indicators.   Based on your clinical judgment, please clarify and document in a progress note and/or discharge summary the clinical condition associated with the following supporting information: In responding to this query please exercise your independent judgment.  The fact that a query is asked, does not imply that any particular answer is desired or expected.  Hello [name]!  This patient has "HTN emergency" documented in the progress notes. If possible, could you please help provide an alternate diagnosis for this patient's condition in the progress note and/or discharge summary. THANK YOU!  Possible Clinical Conditions?  - Malignant HTN  (BP > 170/110 - with vague symptoms)  - Accelerated HTN  (BP > 180/120 - with evidence of end organ damage)  - Other condition (please document in the progress notes and/or discharge summary)  - Cannot Clinically determine at this time   Supporting Information:  - B/P on admission: 239/137, 218/123, 210/120, 206/114, 204/113....      No additional documentation in chart upon review. SM    Thank You,  Hartley Barefoot  Clinical Documentation Specialist: [phone] Pager  [place]

## 2013-01-07 NOTE — ED Notes (Signed)
Pt wanded by security per protocol. Pt to be seen by [facility] and they will speak with [facility] in the morning for pt to come and get his meds adjusted. The plan is for pt to go to [facility] in the morning. CN aware. Pt given sprite, PB & Graham crackers, & warm blanket.

## 2013-01-07 NOTE — Plan of Care (Signed)
Problem: Food- and Nutrition-Related Knowledge Deficit (NB-1.1) Goal: Nutrition education Formal process to instruct or train a patient/client in a skill or to impart knowledge to help patients/clients voluntarily manage or modify food choices and eating behavior to maintain or improve health. Outcome: Completed/Met Date Met:  01/07/13 Received consult for diet education for patient with complaints of hypoglycemia at home. Patient reports discussing this issue with RD at OP dialysis center.  They have put together a meal plan for patient to follow to ensure adequate oral intake.  Discussed usual diet intake and ways to prevent hypoglycemia.  Patient was very receptive.  Patient reports that he has not had any hypoglycemic episodes in the hospital since he has been eating well.  Requests snacks between meals, RD will arrange.  Molli Barrows, RD, LDN, [place] Pager# [phone] After Hours Pager# [phone]

## 2013-01-07 NOTE — Progress Notes (Signed)
  Echocardiogram 2D Echocardiogram has been performed.  Sherryann Frese FRANCES 01/07/2013, 4:01 PM

## 2013-01-07 NOTE — Discharge Summary (Signed)
Physician Discharge Summary     Patient ID: [name] MRN: [id number] DOB/AGE: January 24, 1971 42 y.o.  Admit date: 01/05/2013 Discharge date: 01/07/2013  Admission Diagnoses: Acute respiratory failure  Discharge Diagnoses:  Principal Problem:   Respiratory failure with hypoxia Active Problems:   Pulmonary edema   Hypertensive emergency   Significant Hospital tests/ studies/ interventions and procedures  HPI: [name] is a 42 year old man with dialysis dependent, end-stage renal disease of unknown etiology who presented to [name] on the night of March 1 complaining of dyspnea. He reports that he woke up from not short of breath. He attended dialysis yesterday where he was noted to be 13 pounds over his dry weight.  Hospital Course:  Hypoxic respiratory failure in the setting of hypertensive emergency and pulmonary edema  Treated w/ BP management and emergent HD. Clinically improved after aggressive volume removal (5Kg off on his first treatment). No changes made in his antihypertensive regimen. BP stable at discharge.  Plan Discharge to home Cont home medication regimen Cont home HD regimen/ schedule F/u w/ nephrology   ESRD See above  HTN/ volume overload Looks better after HD and resuming home meds. Did increase his clonidine to 0.3  Improved w/ HD Home on regular regimen.   Anemia in setting of CKD No interventions needed.    Discharge Exam: BP 155/99  Pulse 77  Temp(Src) 98.3 F (36.8 C) (Oral)  Resp 22  Ht 6\' 3"  (1.905 m)  Wt 89 kg (196 lb 3.4 oz)  BMI 24.52 kg/m2  SpO2 96% Room air  Physical Examination:  General: Appears older than stated age Neuro: Alert and oriented x3, cranial nerves II through XII are grossly intact  HEENT: Pupils equally round and reactive to light, extraocular muscles intact, oropharynx clear  Neck: Supple, no masses, positive JVD  Cardiovascular: Normal rate regular rhythm, 2/6 systolic murmur heard best at left upper to order    Lungs: Bilateral crackles, good air movement throughout  Abdomen: Soft nontender nondistended, positive bowel sounds, no palpable HSM  Musculoskeletal: No joint abnormalities, AV fistula with good thrill and left forearm  Skin: No rashes bruises or lesions  Labs at discharge Lab Results  Component Value Date   CREATININE 8.07* 01/07/2013   BUN 28* 01/07/2013   NA 136 01/07/2013   K 3.8 01/07/2013   CL 96 01/07/2013   CO2 28 01/07/2013   Lab Results  Component Value Date   WBC 4.8 01/07/2013   HGB 10.0* 01/07/2013   HCT 30.4* 01/07/2013   MCV 94.7 01/07/2013   PLT 107* 01/07/2013   Lab Results  Component Value Date   ALT 12 11/02/2011   AST 5 11/02/2011   ALKPHOS 89 11/02/2011   BILITOT 0.3 11/02/2011   Lab Results  Component Value Date   INR 1.25 11/01/2011   INR 1.3 03/06/2008   INR 1.2 11/03/2007    Current radiology studies Dg Chest Port 1 View  01/07/2013  *RADIOLOGY REPORT*  Clinical Data: Pulmonary edema.  Shortness of breath.  PORTABLE CHEST - 1 VIEW  Comparison: Chest x-ray 01/06/2013.  Findings: Lung volumes are low.  Compared to the prior examination there is significantly improved aeration with resolving bilateral air space disease.  Minimal residual interstitial prominence is seen in a patchy distribution throughout the lungs bilaterally, most pronounced throughout the left lung.  No definite pleural effusions.  Heart size remains moderately enlarged.  Upper mediastinal contours are unremarkable.  Atherosclerosis in the thoracic aorta.  IMPRESSION: 1.  Significantly improved aeration and with resolution of air space disease but some residual interstitial prominence in an asymmetric distribution in the lungs bilaterally.  Given the asymmetry of these findings, this is favored to represent a multilobar pneumonia rather than pulmonary edema. 2.  Moderate cardiomegaly.   Original Report Authenticated By: Vinnie Langton, M.D.    Dg Chest Port 1 View  01/06/2013  *RADIOLOGY REPORT*   Clinical Data: Acute respiratory distress.  Shortness of breath, vomiting.  PORTABLE CHEST - 1 VIEW  Comparison: 01/05/2013  Findings: Marked enlargement of the cardiopericardial silhouette. Given the configuration, I cannot exclude pericardial effusion. Bilateral airspace disease, left greater than right, likely multifocal pneumonia.  There is some improvement in the lower lobes bilaterally.  No definite effusions.  IMPRESSION: Continued bilateral airspace disease, left greater than right with some improvement in the lower lobes.  Marked enlargement of the cardiopericardial silhouette.  Given the appearance, cannot exclude pericardial effusion.   Original Report Authenticated By: Rolm Baptise, M.D.    Dg Chest Portable 1 View  01/05/2013  *RADIOLOGY REPORT*  Clinical Data: Shortness of breath, hypertension  PORTABLE CHEST - 1 VIEW  Comparison: 02/18/2011  Findings: Near complete opacification of the left lung.  Additional patchy opacity in the right lower lobe.  Differential considerations include multifocal pneumonia or moderate interstitial edema.  No pneumothorax.  Cardiomegaly.  IMPRESSION: Near complete opacification of the left lung with additional patchy opacity in the right lower lobe, possibly reflecting multifocal pneumonia or moderate interstitial edema.  Cardiomegaly.   Original Report Authenticated By: Julian Hy, M.D.     Disposition:  01-Home or Self Care      Discharge Orders   Future Appointments Provider Department Dept Phone   01/09/2013 2:00 PM Donnamarie Rossetti, [place] [phone]   01/17/2013 9:15 AM [name], MD [place]) [phone]   Future Orders Complete By Expires     ([place]) Call MD:  Anytime you have any of the following symptoms: 1) 3 pound weight gain in 24 hours or 5 pounds in 1 week 2) shortness of breath, with or without a dry hacking cough 3) swelling in the hands, feet or stomach 4)  if you have to sleep on extra pillows at night in order to breathe.  As directed     Call MD for:  extreme fatigue  As directed     Call MD for:  severe uncontrolled pain  As directed     Diet - low sodium heart healthy  As directed     Increase activity slowly  As directed         Medication List    TAKE these medications       amLODipine 5 MG tablet  Commonly known as:  NORVASC  Take 1 tablet (5 mg total) by mouth 2 (two) times daily.     carvedilol 25 MG tablet  Commonly known as:  COREG  Take 25 mg by mouth 2 (two) times daily with a meal.     cloNIDine 0.3 MG tablet  Commonly known as:  CATAPRES  Take 1 tablet (0.3 mg total) by mouth 2 (two) times daily.     lisinopril 20 MG tablet  Commonly known as:  PRINIVIL,ZESTRIL  Take 20 mg by mouth 2 (two) times daily.     RENVELA 2.4 G Pack  Generic drug:  sevelamer carbonate  Take 2.4 g by mouth 3 (three) times daily.  SENSIPAR 60 MG tablet  Generic drug:  cinacalcet  Take 60 mg by mouth daily.     ZANTAC 150 MG capsule  Generic drug:  ranitidine  Take 150 mg by mouth every evening.         Discharged Condition: good  Physician Statement:   The Patient was personally examined, the discharge assessment and plan has been personally reviewed and I agree with ACNP Babcock's assessment and plan. > 30 minutes of time have been dedicated to discharge assessment, planning and discharge instructions.   Signed: BABCOCK,PETE 01/07/2013, 2:51 PM   Eval sys after transition to oral home clonidine Renal o for dc as well  Lavon Paganini. Titus Mould, MD, [place] Pgr: [facility]

## 2013-01-07 NOTE — ED Provider Notes (Signed)
History     CSN: [id number]  Arrival date & time 01/07/13  2115   First MD Initiated Contact with Patient 01/07/13 2139      Chief Complaint  Patient presents with  . Medical Clearance    HPI  The patient presents less than 4 hours after being discharged from this facility.  He states that upon return home he began to feel particularly anxious, enraged.  He began to have violent thoughts, though no thoughts of suicidal ideation or homicidal ideation.  With these increasingly violent thoughts, concern for his own safety and consideration for property damage, he presented to his outpatient psychiatric facility.  They referred him here for further evaluation medically. On exam the patient denies any pain, any dyspnea.  He states that he has not been able to take his bipolar medication due to recent critical illness. Notably, I admitted this patient to the ICU for emergent dialysis 2 days ago after he came in to the hospital profoundly dyspneic, hypoxic, tachycardic, and distress.  He was found to have significant fluid overload status with pulmonary congestion/edema as well as multiple lab abnormalities that required emergent dialysis.  In the interval the patient states that he is physically been improving substantially, and again, currently he has no physical complaints.  Past Medical History  Diagnosis Date  . Hypertension   . Anemia   . Depression   . Blood transfusion   . CHF (congestive heart failure)   . ESRD on hemodialysis 07/13/2011    Started hemodialysis in 2006. Gets HD MWF in [facility] with [facility]. L forearm AVF.      Past Surgical History  Procedure Laterality Date  . Cholecystectomy    . Angioplasty    . Dialysis fistula creation    . Knee surgery    . Mouth surgery    . Nephrectomy transplanted organ      Family History  Problem Relation Age of Onset  . Arthritis Mother   . Hypertension Mother   . Diabetes Mother   . Arthritis Father   . Hypertension  Father   . Diabetes Father     History  Substance Use Topics  . Smoking status: Never Smoker   . Smokeless tobacco: Not on file  . Alcohol Use: 0.5 oz/week    1 drink(s) per week     Comment: drinks liquor once a month      Review of Systems  Constitutional:       Per HPI, otherwise negative  HENT:       Per HPI, otherwise negative  Respiratory:       Per HPI, otherwise negative  Cardiovascular:       Per HPI, otherwise negative  Gastrointestinal: Negative for vomiting.  Endocrine:       Negative aside from HPI  Genitourinary:       Neg aside from HPI   Musculoskeletal:       Per HPI, otherwise negative  Skin: Negative.   Neurological: Negative for syncope.  Psychiatric/Behavioral:       History of present illness    Allergies  Delacort; Benicar; Food; Hydralazine; and Nitrates, organic  Home Medications   Current Outpatient Rx  Name  Route  Sig  Dispense  Refill  . amLODipine (NORVASC) 5 MG tablet   Oral   Take 1 tablet (5 mg total) by mouth 2 (two) times daily.   60 tablet   11     DISREGARD FIRST REFILL REQUEST  ./  CY   . carvedilol (COREG) 25 MG tablet   Oral   Take 25 mg by mouth 2 (two) times daily with a meal.          . cinacalcet (SENSIPAR) 60 MG tablet   Oral   Take 60 mg by mouth daily.         . cloNIDine (CATAPRES) 0.3 MG tablet   Oral   Take 1 tablet (0.3 mg total) by mouth 2 (two) times daily.   60 tablet   6   . lisinopril (PRINIVIL,ZESTRIL) 20 MG tablet   Oral   Take 20 mg by mouth 2 (two) times daily.          . ranitidine (ZANTAC) 150 MG capsule   Oral   Take 150 mg by mouth every evening.           . Sevelamer Carbonate (RENVELA) 2.4 G PACK   Oral   Take 2.4 g by mouth 3 (three) times daily.           BP 174/104  Pulse 83  Temp(Src) 98.5 F (36.9 C) (Oral)  Resp 14  SpO2 99%  Physical Exam  Nursing note and vitals reviewed. Constitutional: He is oriented to person, place, and time. He appears  well-developed. No distress.  HENT:  Head: Normocephalic and atraumatic.  Eyes: Conjunctivae and EOM are normal.  Pulmonary/Chest: Effort normal. No stridor. No respiratory distress.  Musculoskeletal: He exhibits no edema.  Neurological: He is alert and oriented to person, place, and time.  Skin: Skin is warm and dry.  Psychiatric: He has a normal mood and affect. His speech is normal and behavior is normal. Judgment and thought content normal. Cognition and memory are normal.  The patient states that he is in the position familiar to himself, when he has not taken medication, and on edge of "snapping"    ED Course  Procedures (including critical care time)  Labs Reviewed - No data to display Dg Chest [facility] 1 View  01/07/2013  *RADIOLOGY REPORT*  Clinical Data: Pulmonary edema.  Shortness of breath.  PORTABLE CHEST - 1 VIEW  Comparison: Chest x-ray 01/06/2013.  Findings: Lung volumes are low.  Compared to the prior examination there is significantly improved aeration with resolving bilateral air space disease.  Minimal residual interstitial prominence is seen in a patchy distribution throughout the lungs bilaterally, most pronounced throughout the left lung.  No definite pleural effusions.  Heart size remains moderately enlarged.  Upper mediastinal contours are unremarkable.  Atherosclerosis in the thoracic aorta.  IMPRESSION: 1.  Significantly improved aeration and with resolution of air space disease but some residual interstitial prominence in an asymmetric distribution in the lungs bilaterally.  Given the asymmetry of these findings, this is favored to represent a multilobar pneumonia rather than pulmonary edema. 2.  Moderate cardiomegaly.   Original Report Authenticated By: Vinnie Langton, M.D.    Dg Chest Port 1 View  01/06/2013  *RADIOLOGY REPORT*  Clinical Data: Acute respiratory distress.  Shortness of breath, vomiting.  PORTABLE CHEST - 1 VIEW  Comparison: 01/05/2013  Findings: Marked  enlargement of the cardiopericardial silhouette. Given the configuration, I cannot exclude pericardial effusion. Bilateral airspace disease, left greater than right, likely multifocal pneumonia.  There is some improvement in the lower lobes bilaterally.  No definite effusions.  IMPRESSION: Continued bilateral airspace disease, left greater than right with some improvement in the lower lobes.  Marked enlargement of the cardiopericardial silhouette.  Given the appearance, cannot exclude  pericardial effusion.   Original Report Authenticated By: Rolm Baptise, M.D.      No diagnosis found.  A review of the patient's chart states that he has laboratory evaluation from several hours ago today.  He also completed a dialysis session today.  MDM  This generally well-appearing male, which is substantially different from several days ago, now presents with concerns of increasingly agitated behavior, violent thoughts.  The patient has not been able to take his bipolar disorder medication do to recent critical illness.  I restarted the patient's medication.  If the patient remains stable, both medically and psychiatrically he is appropriate for discharge in the morning to followup with his outpatient psychiatrist.  With no new physical complaints additional labs were not indicated.  This may require consideration if he has any notable changes overnight. In general, the absence of distress, the patient's clear interactive processes, his capacity to followup with a previously established care Tewana Bohlen is reassuring.     Carmin Muskrat, MD 01/07/13 2245

## 2013-01-07 NOTE — Procedures (Signed)
Pt seen on HD.  K 3.8 change to 4K bath.  Breathing much better.  CXR improved.  Will get weight post HD and use that as new DW.  OK to go from my standpoint after HD.

## 2013-01-08 NOTE — BH Assessment (Addendum)
Assessment Note   [name] is an 41 y.o. male that was assessed this day.  Pt recently admitted to ICU for emergent dialysis, was released, and then returned back to [name] because he reportedly felt like he was having a panic attack and was enraged.  Pt stated he made the mistake of not going directly to his aunt's house after being discharged from ICU.  Pt went directly to his current [name], [name], who referred him back to [name] for medical clearance.  Pt has been medically cleared.  Pt denies SI/HI or psychosis.  However, pt reported he has Bipolar Disorder and history of panic attacks.  Pt also admits his medical issues are a big stressor for him.  Pt admits to increasing depression.  Pt denies racing thoughts or mania at this time.  He has been treated at [name] for 2 years and takes his medications as prescribed.  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  Pt reported that when he feels panic, he gets enraged, because it makes him mad.  Pt reports he destroys his own property, by breaking things or tearing things up when angry.  Pt stated he feels he needs a medication adjustment.  Pt denies SA.  Pt stated he plans to walk in today at [name] to see his psychiatrist and that he has already talked with [name] about this.  Pt made aware that there is a 2 hour delay because of inclement weather today.  Pt plans to have someone pick him up and transport him there.  Consulted with [name] who is in agreement with this.  Pt to be discharged and follow up today with [name].  Completed assessment, assessment notification, and faxed to [name] to log.  Updated ED staff.  Axis I: 296.63 Bipolar I Disorder, Most Recent Episode Mixed, Severe Without Psychotic Features Axis II: Deferred Axis III:  Past Medical History  Diagnosis Date  . Hypertension   . Anemia   . Depression   . Blood transfusion   . CHF (congestive heart failure)   . ESRD on hemodialysis 07/13/2011    Started  hemodialysis in 2006. Gets HD MWF in [facility] with [facility]. L forearm AVF.     Axis IV: other psychosocial or environmental problems Axis V: 41-50 serious symptoms  Past Medical History:  Past Medical History  Diagnosis Date  . Hypertension   . Anemia   . Depression   . Blood transfusion   . CHF (congestive heart failure)   . ESRD on hemodialysis 07/13/2011    Started hemodialysis in 2006. Gets HD MWF in [name] with [facility]. L forearm AVF.      Past Surgical History  Procedure Laterality Date  . Cholecystectomy    . Angioplasty    . Dialysis fistula creation    . Knee surgery    . Mouth surgery    . Nephrectomy transplanted organ      Family History:  Family History  Problem Relation Age of Onset  . Arthritis Mother   . Hypertension Mother   . Diabetes Mother   . Arthritis Father   . Hypertension Father   . Diabetes Father     Social History:  reports that he has never smoked. He does not have any smokeless tobacco history on file. He reports that he drinks about 0.5 ounces of alcohol per week. He reports that he does not use illicit drugs.  Additional Social History:  Alcohol / Drug Use Pain Medications: see MAR Prescriptions: see MAR Over the Counter: see MAR History of alcohol / drug use?: No history of alcohol / drug abuse Longest period of sobriety (when/how long): na Negative Consequences of Use:  (na) Withdrawal Symptoms: Nausea / Vomiting  CIWA: CIWA-Ar BP: 175/112 mmHg Pulse Rate: 74 COWS:    Allergies:  Allergies  Allergen Reactions  . Delacort (Hydrocortisone Base) Nausea Only  . Benicar (Olmesartan)     Unknown  . Food Itching    bananas  . Hydralazine     Head ache  . Nitrates, Organic Other (See Comments)    Palpitations, headache    Home Medications:  (Not in a [name] admission)  OB/GYN Status:  No LMP for male patient.  General Assessment Data Location of Assessment: [name] Living Arrangements: Alone Can pt  return to current living arrangement?: Yes Admission Status: Voluntary Is patient capable of signing voluntary admission?: Yes Transfer from: [place] [name] Referral Source: Self/Family/Friend  Education Status Is patient currently in school?: No  Risk to self Suicidal Ideation: No Suicidal Intent: No Is patient at risk for suicide?: No Suicidal Plan?: No Access to Means: No What has been your use of drugs/alcohol within the last 12 months?: pt denies Previous Attempts/Gestures: No How many times?: 0 Other Self Harm Risks: pt denies Triggers for Past Attempts: None known Intentional Self Injurious Behavior: None Family Suicide History: No Recent stressful life event(s): Recent negative physical changes;Other (Comment) (Medical issues, recently in ICU, anger issues) Persecutory voices/beliefs?: No Depression: Yes Depression Symptoms: Despondent;Isolating;Fatigue;Loss of interest in usual pleasures;Feeling worthless/self pity;Feeling angry/irritable Substance abuse history and/or treatment for substance abuse?: No Suicide prevention information given to non-admitted patients: Yes  Risk to Others Homicidal Ideation: No Thoughts of Harm to Others: No Current Homicidal Intent: No Current Homicidal Plan: No Access to Homicidal Means: No Identified Victim: pt denies History of harm to others?: No Assessment of Violence: None Noted Violent Behavior Description: na - pt calm, cooperative Does patient have access to weapons?: No Criminal Charges Pending?: No Does patient have a court date: No  Psychosis Hallucinations: None noted Delusions: None noted  Mental Status Report Appear/Hygiene: Other (Comment) (casual) Eye Contact: Good Motor Activity: Unremarkable Speech: Logical/coherent Level of Consciousness: Alert Mood: Depressed Affect: Appropriate to circumstance Anxiety Level: Panic Attacks Panic attack frequency:  (varies) Most recent panic attack: 01/05/13 Thought  Processes: Coherent;Relevant Judgement: Unimpaired Orientation: Person;Place;Time;Situation Obsessive Compulsive Thoughts/Behaviors: None  Cognitive Functioning Concentration: Decreased Memory: Recent Intact;Remote Intact IQ: Average Insight: Fair Impulse Control: Fair Appetite: Fair Weight Loss: 0 Weight Gain: 0 Sleep: No Change Total Hours of Sleep:  (7-8 hrs per night) Vegetative Symptoms: None  ADLScreening [name]) Patient's cognitive ability adequate to safely complete daily activities?: Yes Patient able to express need for assistance with ADLs?: Yes Independently performs ADLs?: Yes (appropriate for developmental age)  Abuse/Neglect [facility]) Physical Abuse: Denies Verbal Abuse: Denies Sexual Abuse: Denies  Prior Inpatient Therapy Prior Inpatient Therapy: No Prior Therapy Dates: na Prior Therapy Facilty/Darvis Croft(s): na Reason for Treatment: na  Prior Outpatient Therapy Prior Outpatient Therapy: Yes Prior Therapy Dates: 2012, Current Prior Therapy Facilty/Kameran Lallier(s): 2012 - [facility], 2012-current - [name] Reason for Treatment: Bipolar Disorder/Med mgnt/Therapy  ADL Screening (condition at time of admission) Patient's cognitive ability adequate to safely complete daily activities?: Yes Patient able to express need for assistance with ADLs?: Yes Independently performs ADLs?: Yes (appropriate for developmental age)  Home Assistive Devices/Equipment Home Assistive Devices/Equipment: None  Abuse/Neglect Assessment (Assessment to be complete while patient is alone) Physical Abuse: Denies Verbal Abuse: Denies Sexual Abuse: Denies Exploitation of patient/patient's resources: Denies Self-Neglect: Denies Values / Beliefs Cultural Requests During Hospitalization: None Spiritual Requests During Hospitalization: None Consults Spiritual Care Consult Needed: No Social Work Consult Needed: No Regulatory affairs officer (For Healthcare) Advance  Directive: Patient has advance directive, copy not in chart Type of Advance Directive: Architect not in Chart: Copy requested from family    Additional Information 1:1 In Past 12 Months?: No CIRT Risk: No Elopement Risk: No Does patient have medical clearance?: Yes     Disposition:  Disposition Initial Assessment Completed: Yes Disposition of Patient: Referred to;Outpatient treatment Type of outpatient treatment: Adult Patient referred to: Outpatient clinic referral (Referred to current [facility] - [name])  On Site Evaluation by:   Reviewed with Physician:  [name]   Daisey Must 01/08/2013 8:47 AM

## 2013-01-08 NOTE — ED Provider Notes (Signed)
0900.  Pt is sitting on edge of bed.  He appears comfortable.  He has verified outpt follow-up and has been deemed to not be appropriate for inpt placement.  He will be discharged from the ER at 1000 to follow-up at [facility] immediately.    Perlie Mayo, MD 01/08/13 0930

## 2013-01-08 NOTE — ED Notes (Signed)
Pt is going to call his friend for a ride at 1000

## 2013-01-08 NOTE — ED Notes (Signed)
PT MADE AWARE [facility] IS OPENING AT 10 AM THIS MORNING DUE TO A WEATHER DELAY

## 2013-01-08 NOTE — ED Notes (Signed)
PATIENT REPORTS HE "MADE THE MISTAKE OF NOT GOING TO MY AUNTS [place]". STATES HE HAD A PANIC ATTACK LAST NIGHT. STATES HE TRIED TO GET A CRISIS BED AT [facility] BUT THEY WOULD NOT ACCEPT HIM DUE TO BEING A DIALYSIS PT. STATES HE THEN CAME HERE. HE DENIES SI OR HI. STATES HE RESTED WELL AFTER MEDS LAST NIGHT. DENIES ANXIETY AT THIS TIME. HE IS CALM AND COOPERATIVE. HE STATES THAT HE AND DR LOCKWOOD DISCUSSED THAT HE WOULD STAY AT THE HOSPTIAL LAST NIGHT AND THEN FOLLOW UP WITH [facility] THIS MORNING

## 2013-01-09 ENCOUNTER — Ambulatory Visit (INDEPENDENT_AMBULATORY_CARE_PROVIDER_SITE_OTHER): Payer: Medicare Other | Admitting: Family Medicine

## 2013-01-09 ENCOUNTER — Encounter: Payer: Self-pay | Admitting: Family Medicine

## 2013-01-09 VITALS — BP 162/90 | HR 91 | Temp 99.2°F | Ht 75.0 in | Wt 211.0 lb

## 2013-01-09 DIAGNOSIS — E162 Hypoglycemia, unspecified: Secondary | ICD-10-CM

## 2013-01-09 DIAGNOSIS — I509 Heart failure, unspecified: Secondary | ICD-10-CM

## 2013-01-09 NOTE — Progress Notes (Signed)
  Subjective:    Patient ID: [name], male    DOB: [birth year], 42 y.o.   MRN: [id number]  HPI  Patient here to discuss hypoglycemia during HD.    Low BS 70 during dialysis last Monday.  A few days ago during dialysis, BS range 70-86.  When BS are low he becomes shaky, anxious, and disoriented.  No nausea or vomiting or HA.  He also develops chest tightness and numbness/tingling in both hands and legs.  He keeps [facility] or orange soda with him at all times, and symptoms resolve after eating/drinking.  Patient says he has a hx of panic attacks that also present with shakiness, CP, and SOB.  He has never been diagnosed with DM.    He was recently hospitalized last week for acute CHF exacerbation.  He was admitted to ICU.  Today he denies any SOB, CP, productive cough, or peripheral edema.   Review of Systems Per HPI    Objective:   Physical Exam  Constitutional: He appears well-nourished. No distress.  Neck: Normal range of motion. No JVD present.  Cardiovascular: Normal rate and regular rhythm.   Murmur heard. Pulmonary/Chest: Effort normal and breath sounds normal. He has no wheezes. He has no rales.  Musculoskeletal: He exhibits no edema.       Assessment & Plan:

## 2013-01-09 NOTE — Patient Instructions (Addendum)
Before dialysis, try eating a high protein and carbohydrate meal to avoid low blood sugars during dialysis. You can increase calorie intake by adding butter to foods, eating more dairy products, breads, and starchy foods. Schedule follow up appointment with me in 1 month.  Hypoglycemia (Low Blood Sugar) Hypoglycemia is when the glucose (sugar) in your blood is too low. Hypoglycemia can happen for many reasons. It can happen to people with or without diabetes. Hypoglycemia can develop quickly and can be a medical emergency.  CAUSES  Having hypoglycemia does not mean that you will develop diabetes. Different causes include:  Missed or delayed meals or not enough carbohydrates eaten.  Medication overdose. This could be by accident or deliberate. If by accident, your medication may need to be adjusted or changed.  Exercise or increased activity without adjustments in carbohydrates or medications.  A nerve disorder that affects body functions like your heart rate, blood pressure and digestion (autonomic neuropathy).  A condition where the stomach muscles do not function properly (gastroparesis). Therefore, medications may not absorb properly.  The inability to recognize the signs of hypoglycemia (hypoglycemic unawareness).  Absorption of insulin  may be altered.  Alcohol consumption.  Pregnancy/menstrual cycles/postpartum. This may be due to hormones.  Certain kinds of tumors. This is very rare. SYMPTOMS   Sweating.  Hunger.  Dizziness.  Blurred vision.  Drowsiness.  Weakness.  Headache.  Rapid heart beat.  Shakiness.  Nervousness. DIAGNOSIS  Diagnosis is made by monitoring blood glucose in one or all of the following ways:  Fingerstick blood glucose monitoring.  Laboratory results. TREATMENT  If you think your blood glucose is low:  Check your blood glucose, if possible. If it is less than 70 mg/dl, take one of the following:  3-4 glucose tablets.   cup  juice (prefer clear like apple).   cup "regular" soda pop.  1 cup milk.  -1 tube of glucose gel.  5-6 hard candies.  Do not over treat because your blood glucose (sugar) will only go too high.  Wait 15 minutes and recheck your blood glucose. If it is still less than 70 mg/dl (or below your target range), repeat treatment.  Eat a snack if it is more than one hour until your next meal. Sometimes, your blood glucose may go so low that you are unable to treat yourself. You may need someone to help you. You may even pass out or be unable to swallow. This may require you to get an injection of glucagon, which raises the blood glucose. HOME CARE INSTRUCTIONS  Check blood glucose as recommended by your caregiver.  Take medication as prescribed by your caregiver.  Follow your meal plan. Do not skip meals. Eat on time.  If you are going to drink alcohol, drink it only with meals.  Check your blood glucose before driving.  Check your blood glucose before and after exercise. If you exercise longer or different than usual, be sure to check blood glucose more frequently.  Always carry treatment with you. Glucose tablets are the easiest to carry.  Always wear medical alert jewelry or carry some form of identification that states that you have diabetes. This will alert people that you have diabetes. If you have hypoglycemia, they will have a better idea on what to do. SEEK MEDICAL CARE IF:   You are having problems keeping your blood sugar at target range.  You are having frequent episodes of hypoglycemia.  You feel you might be having side effects from your  medicines.  You have symptoms of an illness that is not improving after 3-4 days.  You notice a change in vision or a new problem with your vision. SEEK IMMEDIATE MEDICAL CARE IF:   You are a family member or friend of a person whose blood glucose goes below 70 mg/dl and is accompanied by:  Confusion.  A change in mental  status.  The inability to swallow.  Passing out. Document Released: 10/24/2005 Document Revised: 01/16/2012 Document Reviewed: 02/20/2012 [facility] Patient Information 2013 [place].

## 2013-01-09 NOTE — Progress Notes (Signed)
UR Completed.  Vergie Living [id number] 01/09/2013

## 2013-01-11 ENCOUNTER — Encounter: Payer: Self-pay | Admitting: Family Medicine

## 2013-01-11 DIAGNOSIS — E162 Hypoglycemia, unspecified: Secondary | ICD-10-CM | POA: Insufficient documentation

## 2013-01-11 HISTORY — DX: Hypoglycemia, unspecified: E16.2

## 2013-01-13 ENCOUNTER — Encounter: Payer: Self-pay | Admitting: Family Medicine

## 2013-01-13 NOTE — Assessment & Plan Note (Signed)
No evidence of fluid overload on exam.  Continue with HD MWF.  Red flags reviewed with patient.  May need to be more aggressive with HTN.  Advised patient to follow up with Nephrology at [facility].  If BP still elevated next month, may consider referral to Pharmacy Clinic.

## 2013-01-13 NOTE — Assessment & Plan Note (Signed)
Per previous records, serum glucose range 70-80s.  Symptoms during HD could be secondary to hypotension from HD which causes patient to become anxious.  To prevent low CBG during HD, advised patient to eat a meal carb and protein prior to HD to see if this helps.  No further testing at this time.  Follow up in one month.

## 2013-01-17 ENCOUNTER — Ambulatory Visit: Payer: BC Managed Care – PPO | Admitting: Cardiovascular Disease

## 2013-02-08 ENCOUNTER — Ambulatory Visit: Payer: Self-pay | Admitting: Cardiovascular Disease

## 2013-02-22 ENCOUNTER — Encounter: Payer: Self-pay | Admitting: Cardiovascular Disease

## 2013-06-19 ENCOUNTER — Encounter: Payer: Self-pay | Admitting: Cardiovascular Disease

## 2013-06-19 ENCOUNTER — Ambulatory Visit (INDEPENDENT_AMBULATORY_CARE_PROVIDER_SITE_OTHER): Payer: Medicare Other | Admitting: Cardiovascular Disease

## 2013-06-19 VITALS — BP 100/70 | HR 86 | Wt 216.0 lb

## 2013-06-19 DIAGNOSIS — I10 Essential (primary) hypertension: Secondary | ICD-10-CM

## 2013-06-19 DIAGNOSIS — N529 Male erectile dysfunction, unspecified: Secondary | ICD-10-CM

## 2013-06-19 DIAGNOSIS — Z01818 Encounter for other preprocedural examination: Secondary | ICD-10-CM

## 2013-06-19 DIAGNOSIS — R079 Chest pain, unspecified: Secondary | ICD-10-CM

## 2013-06-19 NOTE — Patient Instructions (Addendum)
Your physician wants you to follow-up in:    N[place]elle will receive a reminder letter in the mail two months in advance. If you don't receive a letter, please call our office to schedule the follow-up appointment. Your physician has requested that you have a lexiscan myoview. For further information please visit [email or web address]. Please follow instruction sheet, as given.

## 2013-06-19 NOTE — Assessment & Plan Note (Signed)
Somewhat atypical with negative previous w/u but on transplant list now Will do lexiscan myovue since last one was a year ago

## 2013-06-19 NOTE — Assessment & Plan Note (Signed)
All meds stopped except coreg.  Liberalize dry weight as needed.

## 2013-06-19 NOTE — Assessment & Plan Note (Signed)
Cautioned him about use given lower BP  Advised not to use on dialysis days and to space out coreg on days he uses it

## 2013-06-19 NOTE — Progress Notes (Signed)
Patient ID: [name], male   DOB: 1971-08-19, 42 y.o.   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 Previous reanl transplant at [facility] that lasted 7 years. 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 Primarily followed at [facility] and wants to proceed with retransplant. W/U had been slowed by loss of job. Has seen Dr Marlou Porch at [place] for years but couldn't pay bill and was kicked out of practice. History of cardiomyopathy. EF 45% ? Only echo in our system from 2009 shows EF 65% Cath done 01/2008 with no CAD and EF also 65%. Dr Marlou Porch note indicates improvement from previous echo. Has had labile BP. Gets hypotensive on dialysis. Frequently not able to draw enough fluid off. Primary renal Dr at [facility] is ? Domingo Cocking [phone] and ? Dr D  [phone].  Echo 7/13 showed moderate LVH septal thickness 36mm MAC and AV sclerosis. EF 65%  Clonidine dose has been adjusted over the year for BP lability  9/13 Had normal myovue with EF 57%  Has been using Levitra for 3 years and asked about it  Since last visit has had some exertional chest pain.  Seems worse with lifting light weights and not aerobic activity Maybe 1-2/week.  Last minutes.  Off all BP meds Now except coreg for relative tachycardia.  Has finished transplant evaluation and is on list.  Bladder scan was ok  ROS: Denies fever, malais, weight loss, blurry vision, decreased visual acuity, cough, sputum, SOB, hemoptysis, pleuritic pain, palpitaitons, heartburn, abdominal pain, melena, lower extremity edema, claudication, or rash.  All other systems reviewed and negative  General: Affect appropriate Chronically ill black male HEENT: normal Neck supple with no adenopathy JVP normal no bruits no thyromegaly Lungs clear with no wheezing and good diaphragmatic motion Heart:  S1/S2 no murmur, no rub, gallop or click PMI normal Abdomen: benighn, BS positve, no tenderness, no AAA no bruit.  No HSM or HJR Distal  pulses intact with no bruits No edema Neuro non-focal Skin warm and dry Large shunt in LUE  No muscular weakness   Current Outpatient Prescriptions  Medication Sig Dispense Refill  . ARIPiprazole (ABILIFY) 5 MG tablet Take 5 mg by mouth daily.      . carvedilol (COREG) 25 MG tablet Take 25 mg by mouth 2 (two) times daily with a meal.       . cinacalcet (SENSIPAR) 60 MG tablet Take 60 mg by mouth daily.      . cloNIDine (CATAPRES) 0.3 MG tablet Take 1 tablet (0.3 mg total) by mouth 2 (two) times daily.  60 tablet  6  . ranitidine (ZANTAC) 150 MG capsule Take 150 mg by mouth every evening.        . Sevelamer Carbonate (RENVELA) 2.4 G PACK Take 2.4 g by mouth 3 (three) times daily.      . ARIPiprazole (ABILIFY) 2 MG tablet Take 1 tablet (2 mg total) by mouth daily. Stop date 08/30/11 - to be re-filled by new psychiatrist at [facility]  12 tablet  0   No current facility-administered medications for this visit.    Allergies  Delacort; Benicar; Food; Hydralazine; and Nitrates, organic  Electrocardiogram:  01/14/13  SR rate 89 lateral T wave changes   Assessment and Plan

## 2013-07-02 ENCOUNTER — Ambulatory Visit (HOSPITAL_COMMUNITY): Payer: Medicare Other | Attending: Cardiovascular Disease | Admitting: Radiology

## 2013-07-02 VITALS — BP 84/56 | HR 77 | Ht 75.0 in | Wt 212.0 lb

## 2013-07-02 DIAGNOSIS — R0789 Other chest pain: Secondary | ICD-10-CM | POA: Insufficient documentation

## 2013-07-02 DIAGNOSIS — R51 Headache: Secondary | ICD-10-CM | POA: Insufficient documentation

## 2013-07-02 DIAGNOSIS — I10 Essential (primary) hypertension: Secondary | ICD-10-CM | POA: Insufficient documentation

## 2013-07-02 DIAGNOSIS — Z01818 Encounter for other preprocedural examination: Secondary | ICD-10-CM

## 2013-07-02 DIAGNOSIS — R079 Chest pain, unspecified: Secondary | ICD-10-CM

## 2013-07-02 DIAGNOSIS — R110 Nausea: Secondary | ICD-10-CM | POA: Insufficient documentation

## 2013-07-02 DIAGNOSIS — Z01810 Encounter for preprocedural cardiovascular examination: Secondary | ICD-10-CM

## 2013-07-02 DIAGNOSIS — I2510 Atherosclerotic heart disease of native coronary artery without angina pectoris: Secondary | ICD-10-CM | POA: Insufficient documentation

## 2013-07-02 MED ORDER — TECHNETIUM TC 99M SESTAMIBI GENERIC - CARDIOLITE
33.0000 | Freq: Once | INTRAVENOUS | Status: AC | PRN
Start: 2013-07-02 — End: 2013-07-02
  Administered 2013-07-02: 33 via INTRAVENOUS

## 2013-07-02 MED ORDER — REGADENOSON 0.4 MG/5ML IV SOLN
0.4000 mg | Freq: Once | INTRAVENOUS | Status: AC
  Administered 2013-07-02: 0.4 mg via INTRAVENOUS

## 2013-07-02 MED ORDER — TECHNETIUM TC 99M SESTAMIBI GENERIC - CARDIOLITE
11.0000 | Freq: Once | INTRAVENOUS | Status: AC | PRN
Start: 2013-07-02 — End: 2013-07-02
  Administered 2013-07-02: 11 via INTRAVENOUS

## 2013-07-02 NOTE — Progress Notes (Signed)
[place] [address] [place], [place] [address] [phone]    Cardiology Nuclear Med Study  [name] is a 42 y.o. male     MRN : [id number]     DOB: 11/04/1971  Procedure Date: 07/02/2013  Nuclear Med Background Indication for Stress Test:  Evaluation for Ischemia and Pending Surgical Clearance for Renal Transplant History:  '09 Cath:n/o CAD, EF=65%; '13 [id number], EF=57%; 3/14 Echo:EF=50%, moderate LVH Cardiac Risk Factors:Labile  Hypertension  Symptoms:  Chest Tightness with Exertion (last episode of chest discomfort was about 2-weeks ago) and Nausea   Nuclear Pre-Procedure Caffeine/Decaff Intake:  None > 12 hrs NPO After: 11:00pm   Lungs:  Clear. O2 Sat: 98% on room air. IV 0.9% NS with Angio Cath:  20g  IV Site: R Antecubital x 1, tolerated well IV Started by:  Irven [place], RN  Chest Size (in):  46 Cup Size: n/a  Height: 6\' 3"  (1.905 m)  Weight:  212 lb (96.163 kg)  BMI:  Body mass index is 26.5 kg/(m^2). Tech Comments:  No medications today; Coreg last night    Nuclear Med Study 1 or 2 day study: 1 day  Stress Test Type:  Lexiscan  Reading MD: [name], MD  Order Authorizing Provider:  [name], MD  Resting Radionuclide: Technetium 49m Sestamibi  Resting Radionuclide Dose: 11.0 mCi   Stress Radionuclide:  Technetium 56m Sestamibi  Stress Radionuclide Dose: 33.0 mCi           Stress Protocol Rest HR: 70 Stress HR: 91  Rest BP: 84/56 in trendelenburg Stress BP: 73/47  Exercise Time (min): n/a METS: n/a   Predicted Max HR: 180 bpm % Max HR: 50.56 bpm Rate Pressure Product: 6643   Dose of Adenosine (mg):  n/a Dose of Lexiscan: 0.4 mg  Dose of Atropine (mg): n/a Dose of Dobutamine: n/a mcg/kg/min (at max HR)  Stress Test Technologist: Letta Moynahan, CMA-N  Nuclear Technologist:  Evalina Field, RT-N     Rest Procedure:  Myocardial perfusion imaging was performed at rest 45 minutes following the intravenous  administration of Technetium 38m Sestamibi.  Rest ECG: T wave inversion in inferolateral leads  Stress Procedure:  The patient received IV Lexiscan 0.4 mg over 15-seconds.  Technetium 53m Sestamibi injected at 30-seconds.  He c/o a headache with Lexiscan.  Quantitative spect images were obtained after a 45 minute delay.  Stress ECG: No significant change from baseline ECG  QPS Raw Data Images:  Normal; no motion artifact; normal heart/lung ratio. Stress Images:  Normal homogeneous uptake in all areas of the myocardium. Rest Images:  Normal homogeneous uptake in all areas of the myocardium. Subtraction (SDS):  Normal Transient Ischemic Dilatation (Normal <1.22):  n/a Lung/Heart Ratio (Normal <0.45):  0.33  Quantitative Gated Spect Images QGS EDV:  104 ml QGS ESV:  39 ml  Impression Exercise Capacity:  Lexiscan with no exercise. BP Response:  Normal blood pressure response. Clinical Symptoms:  Headache ECG Impression:  No significant ST segment change suggestive of ischemia. Comparison with Prior Nuclear Study: No images to compare  Overall Impression:  Normal stress nuclear study. Baseline ECG with marked T wave inversions in inferolateral leads  LV Ejection Fraction: 62%.  LV Wall Motion:  NL LV Function; NL Wall Motion  [name]

## 2013-07-24 ENCOUNTER — Telehealth: Payer: Self-pay | Admitting: *Deleted

## 2013-07-24 NOTE — Telephone Encounter (Signed)
PT AWARE OF MYOVIEW RESULTS  COPY FAXED TO  [facility] AT PT'S REQUEST .Adonis Housekeeper

## 2013-08-07 ENCOUNTER — Ambulatory Visit (INDEPENDENT_AMBULATORY_CARE_PROVIDER_SITE_OTHER): Payer: Medicare Other | Admitting: Family Medicine

## 2013-08-07 ENCOUNTER — Encounter: Payer: Self-pay | Admitting: Family Medicine

## 2013-08-07 VITALS — BP 112/64 | HR 81 | Temp 98.6°F | Ht 75.0 in | Wt 221.0 lb

## 2013-08-07 DIAGNOSIS — J069 Acute upper respiratory infection, unspecified: Secondary | ICD-10-CM

## 2013-08-07 NOTE — Patient Instructions (Addendum)
Upper Respiratory Infection, Adult An upper respiratory infection (URI) is also known as the common cold. It is often caused by a type of germ (virus). Colds are easily spread (contagious). You can pass it to others by kissing, coughing, sneezing, or drinking out of the same glass. Usually, you get better in 1 or 2 weeks.  HOME CARE   Only take medicine as told by your doctor.  Please take mucinex, delsym, nasal saline.   Use a warm mist humidifier or breathe in steam from a hot shower.  Dkrin enough water that you are restricted for dialysis.   Get plenty of rest.  Return to work when your temperature is back to normal or as told by your doctor. You may use a face mask and wash your hands to stop your cold from spreading. GET HELP RIGHT AWAY IF:   After the first few days, you feel you are getting worse.  You have questions about your medicine.  You have chills, shortness of breath, or brown or red spit (mucus).  You have yellow or brown snot (nasal discharge) or pain in the face, especially when you bend forward.  You have a fever, puffy (swollen) neck, pain when you swallow, or white spots in the back of your throat.  You have a bad headache, ear pain, sinus pain, or chest pain.  You have a high-pitched whistling sound when you breathe in and out (wheezing).  You have a lasting cough or cough up blood.  You have sore muscles or a stiff neck. MAKE SURE YOU:   Understand these instructions.  Will watch your condition.  Will get help right away if you are not doing well or get worse. Document Released: 04/11/2008 Document Revised: 01/16/2012 Document Reviewed: 02/28/2011 [facility] Patient Information 2014 [place], [place].

## 2013-08-07 NOTE — Progress Notes (Signed)
Subjective:     [name] is a 42 y.o. male who presents for evaluation of symptoms of a URI. Symptoms include congestion and non productive cough. Onset of symptoms was 3 days ago, and has been unchanged since that time. Treatment to date: decongestants.  As well pertinent negatives include fever, chills, productive cough, sinus tenderness, myalgias, sick contacts,   The following portions of the patient's history were reviewed and updated as appropriate: allergies, current medications and problem list.  Review of Systems Pertinent items are noted in HPI.   Objective:    BP 112/64  Pulse 81  Temp(Src) 98.6 F (37 C) (Oral)  Ht 6\' 3"  (1.905 m)  Wt 221 lb (100.245 kg)  BMI 27.62 kg/m2  SpO2 100%  General Appearance:    Alert, cooperative, no distress, appears stated age  Head:    Normocephalic, without obvious abnormality, atraumatic  Eyes:    PERRL, conjunctiva/corneas clear, EOM's intact     Ears:    Normal TM's and external ear canals, both ears  Nose:   Nares normal, septum midline, mucosa edematous, no drainage or sinus tenderness  Throat:   Lips, mucosa, and tongue normal; teeth and gums normal. + pharyngeal erythema, no exudate  Neck:   Supple, symmetrical, trachea midline, no adenopathy;       thyroid:  No enlargement/tenderness/nodule     Lungs:     Clear to auscultation bilaterally, respirations unlabored  Chest wall:    No tenderness or deformity  Heart:    Regular rate and rhythm, S1 and S2 normal, no murmur, rub   or gallop

## 2013-08-07 NOTE — Assessment & Plan Note (Signed)
Likely viral in origin.  No fever, chills, productive cough, lung exam benign w/o rales/rhonchi/wheezes/decreased air movement.  Supportive tx for now with nasal saline, delsym, mucinex.  If no improvement in 5 days, f/u in clinic for possible ABx, tessalon vs Tylenol #3, and possible nasal steroid.

## 2013-08-27 ENCOUNTER — Encounter: Payer: Medicare Other | Admitting: Family Medicine

## 2013-09-02 ENCOUNTER — Ambulatory Visit (INDEPENDENT_AMBULATORY_CARE_PROVIDER_SITE_OTHER): Payer: Medicare Other | Admitting: Family Medicine

## 2013-09-02 ENCOUNTER — Encounter: Payer: Self-pay | Admitting: Family Medicine

## 2013-09-02 VITALS — BP 117/78 | HR 94 | Temp 98.8°F | Ht 75.0 in | Wt 218.8 lb

## 2013-09-02 DIAGNOSIS — N186 End stage renal disease: Secondary | ICD-10-CM

## 2013-09-02 DIAGNOSIS — Z992 Dependence on renal dialysis: Secondary | ICD-10-CM

## 2013-09-02 DIAGNOSIS — F3181 Bipolar II disorder: Secondary | ICD-10-CM

## 2013-09-02 DIAGNOSIS — Z0000 Encounter for general adult medical examination without abnormal findings: Secondary | ICD-10-CM | POA: Insufficient documentation

## 2013-09-02 DIAGNOSIS — E162 Hypoglycemia, unspecified: Secondary | ICD-10-CM

## 2013-09-02 DIAGNOSIS — F3189 Other bipolar disorder: Secondary | ICD-10-CM

## 2013-09-02 DIAGNOSIS — I509 Heart failure, unspecified: Secondary | ICD-10-CM

## 2013-09-02 DIAGNOSIS — I10 Essential (primary) hypertension: Secondary | ICD-10-CM

## 2013-09-02 DIAGNOSIS — N529 Male erectile dysfunction, unspecified: Secondary | ICD-10-CM

## 2013-09-02 DIAGNOSIS — N5089 Other specified disorders of the male genital organs: Secondary | ICD-10-CM

## 2013-09-02 NOTE — Assessment & Plan Note (Signed)
Patient states issue has resolved.

## 2013-09-02 NOTE — Assessment & Plan Note (Signed)
Patient on hemodialysis Monday, Wednesday and Friday at [facility]. Shunt in left forearm. On wait list for renal transplant.

## 2013-09-02 NOTE — Progress Notes (Signed)
Subjective:     Patient ID: [name], male   DOB: April 28, 1971, 42 y.o.   MRN: [id number]  HPI  Annual PE: CHF: Patient is a history of CHF, with recent myocardial perfusion scan that showed ejection fraction is 62%, no wall motion abnormality. Normal left ventricle function. Patient has labile blood pressures due to ESRD.   ESRD: Patient on renal transplant list. History of glomerulonephritis at 42 years of age. Previous renal transplant at [facility] that lasted 7 years. Has been on dialysis for 6 years and is anuric. Primarily followed at [facility] and wants to proceed with renal transplant, currently on wait list. He has dialysis on M,W,F. He reportedly has had some episodes of hypotension and hypoglycemia on dialysis days. This has since been resolved with medication changes.  GU: Patient has a chronic hydrocele is present since 2012. He denies pain, fever or erythema. He states it's about the same in size as prior, however he does state that occasionally he will retain a little more fluid. He understands treatment and at this time does not want to undergo needle aspiration. He states if it becomes an issue he will followup with his urologist. 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 He is taking Levitra 20 mg when necessary. At this time his followed by urologist for issue, however it sounds as if they are waiting for renal transplant to occur before addressing issue.   Psych: Patient has a history of bipolar disorder followed at [facility]. Is currently on Abilify. He states his providers or changing at [facility] and he has some concerns over his medication regimen being continued. Patient also has a history of anxiety, especially on dialysis days. He is prescribe Klonopin 0.5 mg to take prior to dialysis, however patient states he is prescribed 1 mg prior to dialysis. Whatever his dose he states that this is not enough and he would like it to be increased, it's just not lasting long enough he states. He is  asking for Xanax today.  Health maintenance: Recent states that he received his flu vaccination 2 weeks ago and his Pneumovax one month ago. All maintenance labs are being performed at nephrologist.  Patient's past medical, social, and family history were reviewed and updated as appropriate. Review of Systems Negative, with the exception of above mentioned in HPI  Objective:   Physical Exam BP 117/78  Pulse 94  Temp(Src) 98.8 F (37.1 C) (Oral)  Ht 6\' 3"  (1.905 m)  Wt 218 lb 12.8 oz (99.247 kg)  BMI 27.35 kg/m2 Gen: NAD. Pleasant. HEENT: AT. [place]. Bilateral TM unable to be visualized due to cerumen impaction bilaterally. Bilateral eyes without injections or icterus. MMM. Bilateral nares without erythema. Throat without erythema or exudates.  Neck: Supple, no adenopathy, midline trachea, no bruits, no thyromegaly CV: RRR. Very mild 1/6 systolic murmur right sternal border. JVP normal Chest: CTAB, no wheeze or crackles Abd: Soft. NTND. BS present. No Masses palpated.  Ext: No erythema. No edema. Left upper extremity with large shunt. Skin: No rashes, purpura or petechiae.  Neuro:  Normal gait. PERLA. EOMi. Alert. Grossly intact. 5/5 muscle strength upper and lower extremity bilaterally. DTRs equal bilaterally Psych: Normal dress. Appropriate mood and affect.

## 2013-09-02 NOTE — Assessment & Plan Note (Signed)
Patient deferred testicle exam today. He is being followed with urologist for issue. Red flags and  AVS on hydrocele provided. Patient in understanding.

## 2013-09-02 NOTE — Assessment & Plan Note (Addendum)
Patient has been seen at [facility] for bipolar and anxiety. He is being prescribed Celexa and Klonopin for issues through [facility]. He seems to have some concerns about change of provider at [facility]. At this time is to get his medications from psychiatrist at [facility].

## 2013-09-02 NOTE — Assessment & Plan Note (Signed)
Patient reports receiving flu shot and Pneumovax at [facility] 2 weeks and one month ago respectively.

## 2013-09-02 NOTE — Assessment & Plan Note (Signed)
Patient with erectile dysfunction using Levitra when necessary. Per cardiology notes he has been cautioned about his blood pressure and advised not to use on dialysis days and extreme caution with use with Coreg. He should now with ejaculate for the problems and is being worked up with urology. Clinically would not be able to address issue until after renal transplant.

## 2013-09-02 NOTE — Patient Instructions (Addendum)
Hydrocele, Adult Fluid can collect around the testicles. This fluid forms in a sac. This condition is called a hydrocele. The collected fluid causes swelling of the scrotum. Usually, it affects just one testicle. Most of the time, the condition does not cause pain. Sometimes, the hydrocele goes away on its own. Other times, surgery is needed to get rid of the fluid. CAUSES A hydrocele does not develop often. Different things can cause a hydrocele in a man, including:  Injury to the scrotum.  Infection.  X-ray of the area around the scrotum.  A tumor or cancer of the testicle.  Twisting of a testicle.  Decreased blood flow to the scrotum. SYMPTOMS   Swelling without pain. The hydrocele feels like a water-filled balloon.  Swelling with pain. This can occur if the hydrocele was caused by infection or twisting.  Mild discomfort in the scrotum.  The hydrocele may feel heavy.  Swelling that gets smaller when you lie down. DIAGNOSIS  Your caregiver will do a physical exam to decide if you have a hydrocele. This may include:  Asking questions about your overall health, today and in the past. Your caregiver may ask about any injuries, X-rays, or infections.  Pushing on your abdomen or asking you to change positions to see if the size of the hydrocele changes.  Shining a light through the scrotum (transillumination) to see if the fluid inside the scrotum is clear.  Blood tests and urine tests to check for infection.  Imaging studies that take pictures of the scrotum and testicles. TREATMENT  Treatment depends in part on what caused the condition. Options include:  Watchful waiting. Your caregiver checks the hydrocele every so often.  Different surgeries to drain the fluid.  A needle may be put into the scrotum to drain fluid (needle aspiration). Fluid often returns after this type of treatment.  A cut (incision) may be made in the scrotum to remove the fluid sac  (hydrocelectomy).  An incision may be made in the groin to repair a hydrocele that has contact with abdominal fluids (communicating hydrocele).  Medicines to treat an infection (antibiotics). HOME CARE INSTRUCTIONS  What you need to do at home may depend on the cause of the hydrocele and type of treatment. In general:  Take all medicine as directed by your caregiver. Follow the directions carefully.  Ask your caregiver if there is anything you should not do while you recover (activities, lifting, work, sex).  If you had surgery to repair a communicating hydrocele, recovery time may vary. Ask you caregiver about your recovery time.  Avoid heavy lifting for 4 to 6 weeks.  If you had an incision on the scrotum or groin, wash it for 2 to 3 days after surgery. Do this as long as the skin is closed and there are no gaps in the wound. Wash gently, and avoid rubbing the incision.  Keep all follow-up appointments. SEEK MEDICAL CARE IF:   Your scrotum seems to be getting larger.  The area becomes more and more uncomfortable. SEEK IMMEDIATE MEDICAL CARE IF:  You have a fever. Document Released: 04/13/2010 Document Revised: 01/16/2012 Document Reviewed: 04/13/2010 [facility] Patient Information 2014 [place].  It was a pleasure meeting you today. If you have any further complications with hydrocele, or develop symptoms as we discussed today, make an appointment to be seen asap.  Please have your lab results and records from nephrologist forwarded to our clinic. Thank you.  If your Klonopin dose of 1 mg is  not enough, then please call [facility] first.

## 2013-09-02 NOTE — Assessment & Plan Note (Addendum)
Patient currently only on Coreg. BP controlled today at 117/78. Patient did undergo dialysis this morning.

## 2013-09-02 NOTE — Assessment & Plan Note (Signed)
Patient with no evidence of fluid overload on exam. Recent myocardial perfusion scan showed EF of 62%, normal left ventricle function, no wall motion abnormalities. Continue with dialysis Monday Wednesday Friday. Patient currently only on Coreg for blood pressure control, and is controlled in the office today. No changes to regimen.

## 2013-09-09 ENCOUNTER — Telehealth: Payer: Self-pay | Admitting: Family Medicine

## 2013-09-09 NOTE — Telephone Encounter (Signed)
Needs clonazepam refilled [facility] on [address]

## 2013-09-10 ENCOUNTER — Telehealth: Payer: Self-pay | Admitting: Cardiovascular Disease

## 2013-09-10 MED ORDER — CLONAZEPAM 0.5 MG PO TABS: 1.0000 mg | ORAL_TABLET | ORAL | Status: DC | PRN

## 2013-09-10 NOTE — Telephone Encounter (Signed)
SPOKE WITH PT   EARLIER   CALLED DR Ursula Beath OFFICE TWICE  TO GET  EKG  FINALLY RECEIVED   DISCUSSED WITH   DR KATZ PER  DR KATZ NEEDS TO BE  SEEN TOM  APPT  MADE WITH SCOTT WEAVER PAC  AT  2:20  Pm PER PT  FEELS  FINE NO  SYMPTOMS PT  WAS SEEN AS PART OF THE ASSESSMENT  FOR KIDNEY TRANSPLANT   HAD NORMAL MYOVIEW IN [place] .  EKG  GIVEN  TO  CAROL  FIATO CMA  FOR APPT .Adonis Housekeeper

## 2013-09-10 NOTE — Telephone Encounter (Signed)
New Problem:  Pt states he was seen at [facility] today by Dr. Vivia Birmingham [phone] (Phone). Pt states that Dr. Rich Reining told him that he needed to be seen by Dr. Johnsie Cancel immediately or go to the ER based on his EKG. Please advise

## 2013-09-10 NOTE — Telephone Encounter (Signed)
Please inform Pt: [name] and I discussed him continuing his counseling at [facility]. I will refill his prescription once, because I know he was worried about the change of providers over there, but that is not to be mistaken for me taking over his psychiatric care. This refill is just to get him through, until his provider transition at [facility] is complete. Thanks.

## 2013-09-10 NOTE — Telephone Encounter (Signed)
Called and informed the patient of below.

## 2013-09-11 ENCOUNTER — Encounter: Payer: Self-pay | Admitting: Physician Assistant

## 2013-09-11 ENCOUNTER — Encounter: Payer: Self-pay | Admitting: *Deleted

## 2013-09-11 ENCOUNTER — Ambulatory Visit (INDEPENDENT_AMBULATORY_CARE_PROVIDER_SITE_OTHER): Payer: Medicare Other | Admitting: Physician Assistant

## 2013-09-11 VITALS — BP 120/70 | HR 79 | Ht 75.0 in | Wt 226.0 lb

## 2013-09-11 DIAGNOSIS — Z01818 Encounter for other preprocedural examination: Secondary | ICD-10-CM

## 2013-09-11 DIAGNOSIS — I509 Heart failure, unspecified: Secondary | ICD-10-CM

## 2013-09-11 DIAGNOSIS — I428 Other cardiomyopathies: Secondary | ICD-10-CM

## 2013-09-11 DIAGNOSIS — I429 Cardiomyopathy, unspecified: Secondary | ICD-10-CM

## 2013-09-11 DIAGNOSIS — R9431 Abnormal electrocardiogram [ECG] [EKG]: Secondary | ICD-10-CM

## 2013-09-11 DIAGNOSIS — I10 Essential (primary) hypertension: Secondary | ICD-10-CM

## 2013-09-11 NOTE — Progress Notes (Signed)
[address], [place], [place]  [address] Phone: [phone] Fax:  [phone]  Date:  09/11/2013   ID:  [name], DOB 1970-12-21, MRN [id number]  PCP:  Howard Pouch, DO  Cardiologist:  Dr. [name]     History of Present Illness: [name] is a 42 y.o. 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  Prior EF 40-45%.  LHC (01/2008):  No CAD, EF 65%.  Echo (01/2013):  Mild inf HK, mod LVH, EF 50%, mild to mod LAE, mild RVE, PASP 39, mod pericardial effusion (effusion similar to 2012).  Lexiscan Myoview (06/2013):  EF 62%, normal study.  Last seen by Dr. [name] in 06/2013.  Patient was seen yesterday at [facility] for transplant evaluation.  His ECG was felt to be abnormal and he was asked to follow up today.  He has no complaints.  He works out often.  He can spend 30 minutes on the elliptical without chest pain or dyspnea.  No syncope.  No orthopnea, PND, edema.  He has been meeting his dry weight at dialysis.    Recent Labs: 01/07/2013: Creatinine 8.07*; Hemoglobin 10.0*; Potassium 3.8  01/05/2013: Pro B Natriuretic peptide (BNP) 19674.0*   Wt Readings from Last 3 Encounters:  09/02/13 218 lb 12.8 oz (99.247 kg)  08/07/13 221 lb (100.245 kg)  07/02/13 212 lb (96.163 kg)     Past Medical History  Diagnosis Date  . Hypertension   . Anemia   . Depression   . Blood transfusion   . CHF (congestive heart failure)   . ESRD on hemodialysis 07/13/2011    Started hemodialysis in 2006. Gets HD MWF in [facility] with [facility]. L forearm AVF.      Current Outpatient Prescriptions  Medication Sig Dispense Refill  . ARIPiprazole (ABILIFY) 2 MG tablet Take 1 tablet (2 mg total) by mouth daily. Stop date 08/30/11 - to be re-filled by new psychiatrist at [facility]  12 tablet  0  . ARIPiprazole (ABILIFY) 5 MG tablet Take 5 mg by mouth daily.      . carvedilol (COREG) 25 MG tablet Take 25 mg by  mouth 2 (two) times daily with a meal.       . cinacalcet (SENSIPAR) 60 MG tablet Take 60 mg by mouth daily.      . clonazePAM (KLONOPIN) 0.5 MG tablet Take 2 tablets (1 mg total) by mouth as needed (1 mg as needed on dialysis days only.). On dialysis days  30 tablet  0  . ranitidine (ZANTAC) 150 MG capsule Take 150 mg by mouth every evening.        . Sevelamer Carbonate (RENVELA) 2.4 G PACK Take 2.4 g by mouth 3 (three) times daily.       No current facility-administered medications for this visit.    Allergies:   Delacort; Benicar; Food; Hydralazine; and Nitrates, organic   Social History:  The patient  reports that he has never smoked. He does not have any smokeless tobacco history on file. He reports that he drinks about 0.5 ounces of alcohol per week. He reports that he does not use illicit drugs.   Family History:  The patient's family history includes Arthritis in his father and mother; Diabetes in his father and mother; Hypertension in his father and mother.   ROS:  Please see the history of present illness.    All other  systems reviewed and negative.   PHYSICAL EXAM: VS:  BP 120/70  Pulse 79  Ht 6\' 3"  (1.905 m)  Wt 226 lb (102.513 kg)  BMI 28.25 kg/m2 Well nourished, well developed, in no acute distress HEENT: normal Neck: no JVD Cardiac:  normal S1, S2; RRR; no murmur Lungs:  clear to auscultation bilaterally, no wheezing, rhonchi or rales Abd: soft, nontender, no hepatomegaly Ext: no edema Skin: warm and dry Neuro:  CNs 2-12 intact, no focal abnormalities noted  EKG:  From 09/10/13 done at [facility] demonstrates NSR, HR 94, deep inferior T wave inversions. 09/11/13 done at [facility] demonstrates NSR, HR 79, inferolateral T wave inversions similar to prior tracings    ASSESSMENT AND PLAN:  1. Abnormal ECG: Patient is being considered for renal transplant. He tells me that he is on top of the list and may be called anytime for surgery. His transplant  team had him see cardiology at [facility].  His ECG from yesterday is significantly changed. He had a normal cardiac catheterization in 2009. He had a low risk Myoview in 06/2013. I reviewed his case today with Dr. Johnsie Cancel. He also saw the patient. Although he has had a normal workup in the past, given the need for clearance for renal transplant and a significantly changed ECG, cardiac catheterization has been recommended. Risks and benefits of cardiac catheterization have been discussed with the patient.  These include bleeding, infection, kidney damage, stroke, heart attack, death.  The patient understands these risks and is willing to proceed.  2. NICM:  EF has recovered.  Med Rx limited by hypotension at dialysis.   3. HFpEF:  Volume managed by dialysis. 4. Hypertension:  Controlled. 5. ESRD:  He is on dialysis MWF.  Will arrange cardiac cath on Tues or Thurs. 6. Disposition:  Proceed with cardiac cath with Dr. [name].  Signed, Richardson Dopp, PA-C  09/11/2013 2:06 PM

## 2013-09-11 NOTE — Patient Instructions (Addendum)
Your physician recommends that you schedule a follow-up appointment in: WITH DR. Johnsie Cancel THE NEXT AVAILABLE AFTER CATH  Your physician recommends that you continue on your current medications as directed. Please refer to the Current Medication list given to you today.  Your physician has requested that you have a cardiac catheterization November 25TH, 2014. ARRIVE AT 5:30AM BUT PROCEDURE STARTS AT 7:30 A.M. Cardiac catheterization is used to diagnose and/or treat various heart conditions. Doctors may recommend this procedure for a number of different reasons. The most common reason is to evaluate chest pain. Chest pain can be a symptom of coronary artery disease (CAD), and cardiac catheterization can show whether plaque is narrowing or blocking your heart's arteries. This procedure is also used to evaluate the valves, as well as measure the blood flow and oxygen levels in different parts of your heart. Please follow instruction sheet, as given.  Your physician recommends that you return for lab work in: BMET. CBC AND PT-INR (September 26, 2013 AT AT 2:30 P.M)

## 2013-09-11 NOTE — H&P (Signed)
History and Physical  Date:  09/11/2013   ID:  [name], DOB [birth year], MRN [id number]  PCP:  Howard Pouch, DO  Cardiologist:  Dr. [name]     History of Present Illness: [name] is a 42 y.o. 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  Prior EF 40-45%.  LHC (01/2008):  No CAD, EF 65%.  Echo (01/2013):  Mild inf HK, mod LVH, EF 50%, mild to mod LAE, mild RVE, PASP 39, mod pericardial effusion (effusion similar to 2012).  Lexiscan Myoview (06/2013):  EF 62%, normal study.  Last seen by Dr. [name] in 06/2013.  Patient was seen yesterday at [facility] for transplant evaluation.  His ECG was felt to be abnormal and he was asked to follow up today.  He has no complaints.  He works out often.  He can spend 30 minutes on the elliptical without chest pain or dyspnea.  No syncope.  No orthopnea, PND, edema.  He has been meeting his dry weight at dialysis.    Recent Labs: 01/07/2013: Creatinine 8.07*; Hemoglobin 10.0*; Potassium 3.8  01/05/2013: Pro B Natriuretic peptide (BNP) 19674.0*   Wt Readings from Last 3 Encounters:  09/02/13 218 lb 12.8 oz (99.247 kg)  08/07/13 221 lb (100.245 kg)  07/02/13 212 lb (96.163 kg)     Past Medical History  Diagnosis Date  . Hypertension   . Anemia   . Depression   . Blood transfusion   . CHF (congestive heart failure)   . ESRD on hemodialysis 07/13/2011    Started hemodialysis in 2006. Gets HD MWF in [facility] with [facility]. L forearm AVF.      Current Outpatient Prescriptions  Medication Sig Dispense Refill  . ARIPiprazole (ABILIFY) 2 MG tablet Take 1 tablet (2 mg total) by mouth daily. Stop date 08/30/11 - to be re-filled by new psychiatrist at [facility]  12 tablet  0  . ARIPiprazole (ABILIFY) 5 MG tablet Take 5 mg by mouth daily.      . carvedilol (COREG) 25 MG tablet Take 25 mg by mouth 2 (two) times daily with a meal.       . cinacalcet (SENSIPAR) 60 MG  tablet Take 60 mg by mouth daily.      . clonazePAM (KLONOPIN) 0.5 MG tablet Take 2 tablets (1 mg total) by mouth as needed (1 mg as needed on dialysis days only.). On dialysis days  30 tablet  0  . ranitidine (ZANTAC) 150 MG capsule Take 150 mg by mouth every evening.        . Sevelamer Carbonate (RENVELA) 2.4 G PACK Take 2.4 g by mouth 3 (three) times daily.       No current facility-administered medications for this visit.    Allergies:   Delacort; Benicar; Food; Hydralazine; and Nitrates, organic   Social History:  The patient  reports that he has never smoked. He does not have any smokeless tobacco history on file. He reports that he drinks about 0.5 ounces of alcohol per week. He reports that he does not use illicit drugs.   Family History:  The patient's family history includes Arthritis in his father and mother; Diabetes in his father and mother; Hypertension in his father and mother.   ROS:  Please see the history of present illness.    All other systems reviewed and negative.   PHYSICAL EXAM: VS:  BP 120/70  Pulse  79  Ht 6\' 3"  (1.905 m)  Wt 226 lb (102.513 kg)  BMI 28.25 kg/m2 Well nourished, well developed, in no acute distress HEENT: normal Neck: no JVD Cardiac:  normal S1, S2; RRR; no murmur Lungs:  clear to auscultation bilaterally, no wheezing, rhonchi or rales Abd: soft, nontender, no hepatomegaly Ext: no edema Skin: warm and dry Neuro:  CNs 2-12 intact, no focal abnormalities noted  EKG:  From 09/10/13 done at [facility] demonstrates NSR, HR 94, deep inferior T wave inversions. 09/11/13 done at [facility] demonstrates NSR, HR 79, inferolateral T wave inversions similar to prior tracings    ASSESSMENT AND PLAN:  1. Abnormal ECG: Patient is being considered for renal transplant. He tells me that he is on top of the list and may be called anytime for surgery. His transplant team had him see cardiology at [facility].  His ECG from yesterday is  significantly changed. He had a normal cardiac catheterization in 2009. He had a low risk Myoview in 06/2013. I reviewed his case today with Dr. Johnsie Cancel. He also saw the patient. Although he has had a normal workup in the past, given the need for clearance for renal transplant and a significantly changed ECG, cardiac catheterization has been recommended. Risks and benefits of cardiac catheterization have been discussed with the patient.  These include bleeding, infection, kidney damage, stroke, heart attack, death.  The patient understands these risks and is willing to proceed.  2. NICM:  EF has recovered.  Med Rx limited by hypotension at dialysis.   3. HFpEF:  Volume managed by dialysis. 4. Hypertension:  Controlled. 5. ESRD:  He is on dialysis MWF.  Will arrange cardiac cath on Tues or Thurs. 6. Disposition:  Proceed with cardiac cath with Dr. [name].  Signed, Richardson Dopp, PA-C  09/11/2013 2:06 PM

## 2013-09-11 NOTE — H&P (Signed)
Patient examined chart reviewed Discussed with patient. He is on an active transplant list at [facility] ECG shows fairly marked inferior T wave inversions that are new from previous ECG;s Only way to  Make sure heart ok for transplant would be to cath. Already on dialysis so constrast should not be  An issues. Discussed with patient and he is willing to proceed  [name]

## 2013-09-16 ENCOUNTER — Encounter (HOSPITAL_COMMUNITY): Payer: Self-pay | Admitting: Pharmacy Technician

## 2013-09-26 ENCOUNTER — Other Ambulatory Visit (INDEPENDENT_AMBULATORY_CARE_PROVIDER_SITE_OTHER): Payer: Medicare Other

## 2013-09-26 ENCOUNTER — Telehealth: Payer: Self-pay | Admitting: *Deleted

## 2013-09-26 DIAGNOSIS — I509 Heart failure, unspecified: Secondary | ICD-10-CM

## 2013-09-26 DIAGNOSIS — R9431 Abnormal electrocardiogram [ECG] [EKG]: Secondary | ICD-10-CM

## 2013-09-26 LAB — CBC WITH DIFFERENTIAL/PLATELET
Basophils Relative: 0.8 % (ref 0.0–3.0)
Eosinophils Absolute: 0.6 10*3/uL (ref 0.0–0.7)
HCT: 39.2 % (ref 39.0–52.0)
Lymphs Abs: 2 10*3/uL (ref 0.7–4.0)
MCHC: 33.9 g/dL (ref 30.0–36.0)
MCV: 96.4 fl (ref 78.0–100.0)
Monocytes Absolute: 0.6 10*3/uL (ref 0.1–1.0)
Neutro Abs: 1.8 10*3/uL (ref 1.4–7.7)
Neutrophils Relative %: 36.2 % — ABNORMAL LOW (ref 43.0–77.0)
RBC: 4.07 Mil/uL — ABNORMAL LOW (ref 4.22–5.81)

## 2013-09-26 LAB — PROTIME-INR: Prothrombin Time: 12.4 s (ref 10.2–12.4)

## 2013-09-26 LAB — BASIC METABOLIC PANEL
BUN: 73 mg/dL — ABNORMAL HIGH (ref 6–23)
Calcium: 8.5 mg/dL (ref 8.4–10.5)
Creatinine, Ser: 18.3 mg/dL (ref 0.4–1.5)
GFR: 3.7 mL/min — CL (ref >60.00)
Glucose, Bld: 96 mg/dL (ref 70–99)

## 2013-09-26 NOTE — Telephone Encounter (Signed)
LEFT MESSAGE FOR PT TO  CALL  BACK.  HAS  SOME QUESTIONS  RE  LOW  B/P   .Adonis Housekeeper

## 2013-09-26 NOTE — Telephone Encounter (Signed)
ERROR./CY

## 2013-09-26 NOTE — Telephone Encounter (Signed)
pt advised about lab results and ok per Brynda Rim. PA for cath w/Nishan. Pt then said he had a qesution for Altha Harm; was he going to be kept over night; if so was he going to be [employer], or Wed. Asked if Altha Harm could call him back about this.

## 2013-09-27 NOTE — Telephone Encounter (Signed)
Follow up    Pt returned our call

## 2013-09-27 NOTE — Telephone Encounter (Signed)
SPOKE  WITH  PT   HAS  CONCERNS  RE  B/P  ON DIALYSIS  DAYS   GETTING AS LOW AS   70/50   AND  HEART RATE   OVER 100 AND  ON  NON DIALYSIS DAYS  115/80 AND HEART  RATE  85   CURRENTLY IS  TAKING  COREG  25 MG  DAILY   ASKED PT  IF  DIALYSIS TEAM ADDRESSED  STATES THAT  THEY DON'T  WILL  FORWARD  TO  DR  Johnsie Cancel  FOR  REVIEW .Adonis Housekeeper

## 2013-09-27 NOTE — Telephone Encounter (Signed)
LMTCB ./CY 

## 2013-09-27 NOTE — Telephone Encounter (Signed)
Follow Up ° °Pt returned call//  °

## 2013-09-27 NOTE — Telephone Encounter (Signed)
See other phone note ./cy 

## 2013-09-27 NOTE — Telephone Encounter (Signed)
Dont take his coreg in am on dialysis days Just take in evening after

## 2013-09-27 NOTE — Telephone Encounter (Signed)
CALLED  PT   TO INFORM OF  NEW  DIRECTIONS  PER  PT  DIALYSIS  MD   INSTRUCTED PT  TO  TAKE 1/2  TAB OF  COREG ON  DIALYSIS DAYS  PER PT  HAD  BEEN  TAKING MED  LATER IN  DAY  ON DIALYSIS  DAYS  ALREADY WILL TRY DECREASE DOSE AS  INSTRUCTED  BY OTHER MD .Adonis Housekeeper

## 2013-10-01 ENCOUNTER — Encounter (HOSPITAL_COMMUNITY)
Admission: RE | Disposition: A | Discharge status: Home / Self Care | Payer: Self-pay | Source: Ambulatory Visit | Attending: Cardiovascular Disease

## 2013-10-01 ENCOUNTER — Ambulatory Visit (HOSPITAL_COMMUNITY)
Admission: RE | Admit: 2013-10-01 | Discharge: 2013-10-01 | Disposition: A | Discharge status: Home / Self Care | Payer: Medicare Other | Source: Ambulatory Visit | Attending: Cardiovascular Disease | Admitting: Cardiovascular Disease

## 2013-10-01 DIAGNOSIS — I428 Other cardiomyopathies: Secondary | ICD-10-CM | POA: Insufficient documentation

## 2013-10-01 DIAGNOSIS — Z940 Kidney transplant status: Secondary | ICD-10-CM | POA: Insufficient documentation

## 2013-10-01 DIAGNOSIS — I509 Heart failure, unspecified: Secondary | ICD-10-CM | POA: Insufficient documentation

## 2013-10-01 DIAGNOSIS — I2510 Atherosclerotic heart disease of native coronary artery without angina pectoris: Secondary | ICD-10-CM

## 2013-10-01 DIAGNOSIS — Z01818 Encounter for other preprocedural examination: Secondary | ICD-10-CM

## 2013-10-01 DIAGNOSIS — R9431 Abnormal electrocardiogram [ECG] [EKG]: Secondary | ICD-10-CM

## 2013-10-01 DIAGNOSIS — Z992 Dependence on renal dialysis: Secondary | ICD-10-CM | POA: Insufficient documentation

## 2013-10-01 DIAGNOSIS — N186 End stage renal disease: Secondary | ICD-10-CM | POA: Insufficient documentation

## 2013-10-01 DIAGNOSIS — I120 Hypertensive chronic kidney disease with stage 5 chronic kidney disease or end stage renal disease: Secondary | ICD-10-CM | POA: Insufficient documentation

## 2013-10-01 HISTORY — PX: LEFT HEART CATHETERIZATION WITH CORONARY ANGIOGRAM: SHX5451

## 2013-10-01 SURGERY — LEFT HEART CATHETERIZATION WITH CORONARY ANGIOGRAM
Anesthesia: LOCAL

## 2013-10-01 MED ORDER — HEPARIN (PORCINE) IN NACL 2-0.9 UNIT/ML-% IJ SOLN
INTRAMUSCULAR | Status: AC
  Filled 2013-10-01: qty 1000

## 2013-10-01 MED ORDER — SODIUM CHLORIDE 0.9 % IV SOLN: INTRAVENOUS | Status: AC

## 2013-10-01 MED ORDER — HEPARIN SODIUM (PORCINE) 1000 UNIT/ML IJ SOLN
INTRAMUSCULAR | Status: AC
  Filled 2013-10-01: qty 1

## 2013-10-01 MED ORDER — ONDANSETRON HCL 4 MG/2ML IJ SOLN: 4.0000 mg | Freq: Four times a day (QID) | INTRAMUSCULAR | Status: DC | PRN

## 2013-10-01 MED ORDER — ASPIRIN 81 MG PO CHEW
CHEWABLE_TABLET | ORAL | Status: AC
  Filled 2013-10-01: qty 1

## 2013-10-01 MED ORDER — NITROGLYCERIN 0.2 MG/ML ON CALL CATH LAB
INTRAVENOUS | Status: AC
  Filled 2013-10-01: qty 1

## 2013-10-01 MED ORDER — MIDAZOLAM HCL 2 MG/2ML IJ SOLN
INTRAMUSCULAR | Status: AC
  Filled 2013-10-01: qty 2

## 2013-10-01 MED ORDER — ASPIRIN 81 MG PO CHEW: 81.0000 mg | CHEWABLE_TABLET | Freq: Every day | ORAL | Status: DC

## 2013-10-01 MED ORDER — SODIUM CHLORIDE 0.9 % IV SOLN: INTRAVENOUS | Status: DC

## 2013-10-01 MED ORDER — SODIUM CHLORIDE 0.9 % IJ SOLN: 3.0000 mL | INTRAMUSCULAR | Status: DC | PRN

## 2013-10-01 MED ORDER — VERAPAMIL HCL 2.5 MG/ML IV SOLN
INTRAVENOUS | Status: AC
  Filled 2013-10-01: qty 2

## 2013-10-01 MED ORDER — FENTANYL CITRATE 0.05 MG/ML IJ SOLN
INTRAMUSCULAR | Status: AC
  Filled 2013-10-01: qty 2

## 2013-10-01 MED ORDER — LIDOCAINE HCL (PF) 1 % IJ SOLN
INTRAMUSCULAR | Status: AC
  Filled 2013-10-01: qty 30

## 2013-10-01 MED ORDER — ACETAMINOPHEN 325 MG PO TABS: 650.0000 mg | ORAL_TABLET | ORAL | Status: DC | PRN

## 2013-10-01 MED ORDER — SODIUM CHLORIDE 0.9 % IJ SOLN: 3.0000 mL | Freq: Two times a day (BID) | INTRAMUSCULAR | Status: DC

## 2013-10-01 MED ORDER — ASPIRIN 81 MG PO CHEW
81.0000 mg | CHEWABLE_TABLET | ORAL | Status: AC
  Administered 2013-10-01: 81 mg via ORAL

## 2013-10-01 MED ORDER — SODIUM CHLORIDE 0.9 % IV SOLN: 250.0000 mL | INTRAVENOUS | Status: DC | PRN

## 2013-10-01 NOTE — Interval H&P Note (Signed)
History and Physical Interval Note:  10/01/2013 7:16 AM  [name]  has presented today for surgery, with the diagnosis of Abnormal EKG  The various methods of treatment have been discussed with the patient and family. After consideration of risks, benefits and other options for treatment, the patient has consented to  Procedure(s): LEFT HEART CATHETERIZATION WITH CORONARY ANGIOGRAM (N/A) as a surgical intervention .  The patient's history has been reviewed, patient examined, no change in status, stable for surgery.  I have reviewed the patient's chart and labs.  Questions were answered to the patient's satisfaction.     [name]

## 2013-10-01 NOTE — Progress Notes (Signed)
Attempted to remove 3cc of air. Pt bled.  3cc added back. Bleeding stopped. Will try again in 30 min

## 2013-10-01 NOTE — CV Procedure (Signed)
    Cardiac Catheterization Procedure Note  Name: [name] MRN: [id number] DOB: 1971/02/07  Procedure: Left Heart Cath, Selective Coronary Angiography, LV angiography  Indication:  Abnormal myovue Pre Renal Transplant   Procedural Details: The right wrist was prepped, draped, and anesthetized with 1% lidocaine. Using the modified Seldinger technique, a 5 French sheath was introduced into the right radial artery. 3 mg of verapamil was administered through the sheath, weight-based unfractionated heparin was administered intravenously. Standard Judkins catheters were used for selective coronary angiography and left ventriculography. Catheter exchanges were performed over an exchange length guidewire. There were no immediate procedural complications. A TR band was used for radial hemostasis at the completion of the procedure.  The patient was transferred to the post catheterization recovery area for further monitoring.  Procedural Findings: Hemodynamics: AO 83/52 LV 83/6  Coronary angiography: Coronary dominance: right  Left mainstem: Normal  Left anterior descending (LAD): 20-30% mid and distal disease  D1: normal  D2: normal  D3: normal  Left circumflex (LCx):  Normal  OM1: normal  OM2: normal  Right coronary artery (RCA): Dominant large vessel normal  PDA: normal  PLA: normal  Left ventriculography: Left ventricular systolic function is normal, LVEF is estimated at 55-65%, there is no significant mitral regurgitation   Final Conclusions:   No significant CAD  Should be fine for renal transplant.  Tolerated procedure well from right radial.  TR band applied with  10cc at 8:26 a.m.   Recommendations: Medical Rx  [name] 10/01/2013, 8:27 AM

## 2013-10-01 NOTE — Progress Notes (Signed)
Attempted a second time to remove 3cc of air.  Pt bled again. 3cc added back to TRB. Pt stopped bleeding. Dr, Johnsie Cancel notified.  Instructed to wait 1 hour and try again.

## 2013-10-10 ENCOUNTER — Telehealth: Payer: Self-pay | Admitting: *Deleted

## 2013-10-10 NOTE — Telephone Encounter (Signed)
Vickki Muff from [facility] transplant calls for cath lab report to be faxed Faxed to their office at [phone]  States she has already talked with the pt & needed copy of report Horton Chin RN

## 2013-10-10 NOTE — Telephone Encounter (Signed)
CATH REPORT  SENT  AS  REQUESTED./CY

## 2013-11-06 ENCOUNTER — Telehealth: Payer: Self-pay | Admitting: Cardiovascular Disease

## 2013-11-06 NOTE — Telephone Encounter (Signed)
New message   Pt wants to change med from Levitra to Viagra for ins purposes. Wants to know if the change will effect his heart.

## 2013-11-06 NOTE — Telephone Encounter (Signed)
Spoke with [employer], there is no difference on the effect on the heart. Pt was informed and will discuss with his nephrologist that will prescribe.

## 2013-11-13 ENCOUNTER — Ambulatory Visit (INDEPENDENT_AMBULATORY_CARE_PROVIDER_SITE_OTHER): Payer: Medicare Other | Admitting: Family Medicine

## 2013-11-13 ENCOUNTER — Telehealth: Payer: Self-pay | Admitting: Family Medicine

## 2013-11-13 ENCOUNTER — Encounter: Payer: Self-pay | Admitting: Family Medicine

## 2013-11-13 VITALS — BP 107/62 | HR 103 | Temp 98.6°F | Ht 75.0 in | Wt 233.0 lb

## 2013-11-13 DIAGNOSIS — J069 Acute upper respiratory infection, unspecified: Secondary | ICD-10-CM | POA: Insufficient documentation

## 2013-11-13 LAB — INFLUENZA A AND B
INFLUENZA A AG: NEGATIVE
Influenza B Ag: NEGATIVE

## 2013-11-13 NOTE — Progress Notes (Signed)
   Subjective:    Patient ID: [name], male    DOB: 06-10-1971, 43 y.o.   MRN: [id number]  HPI  Flulike symptoms: Patient seen for an SDA for sore throat, itchy eyes, body pains, cough and fatigue that had acute onset yesterday afternoon. Patient is in end-stage renal disease patient states his last dialysis was yesterday. He denies headache, vomiting or diarrhea. He is around many sick contacts in the community with his job. He denies fevers but states he is always kind of chilled. He endorses been mildly nauseous with lack of appetite. Not eating or drinking well.  Review of Systems Negative, with the exception of above mentioned in HPI     Objective:   Physical Exam BP 107/62  Pulse 103  Temp(Src) 98.6 F (37 C) (Oral)  Ht 6\' 3"  (1.905 m)  Wt 233 lb (105.688 kg)  BMI 29.12 kg/m2  SpO2 100% Gen: NAD. Appears fatigued. Sleeping in the chair. HEENT: AT. [place]. Bilateral TM visualized and normal in appearance. Bilateral eyes with mild injections. MMM. Bilateral nares pale. Throat without erythema or exudates.  CV: Mildly tachycardic. Regular rhythm Chest: CTAB, no wheeze or crackles Abd: Soft. NTND. BS present Ext: No erythema. No edema.

## 2013-11-13 NOTE — Telephone Encounter (Signed)
[facility] Call  Patient seen in clinic today and flu a and b ordered. [facility] called and both both were negative. Will forward to the patients PCP.  Tommi Rumps, MD [place] PGY-2

## 2013-11-13 NOTE — Patient Instructions (Signed)
Influenza, Adult Influenza ("the flu") is a viral infection of the respiratory tract. It occurs more often in winter months because people spend more time in close contact with one another. Influenza can make you feel very sick. Influenza easily spreads from person to person (contagious). CAUSES  Influenza is caused by a virus that infects the respiratory tract. You can catch the virus by breathing in droplets from an infected person's cough or sneeze. You can also catch the virus by touching something that was recently contaminated with the virus and then touching your mouth, nose, or eyes. SYMPTOMS  Symptoms typically last 4 to 10 days and may include:  Fever.  Chills.  Headache, body aches, and muscle aches.  Sore throat.  Chest discomfort and cough.  Poor appetite.  Weakness or feeling tired.  Dizziness.  Nausea or vomiting. DIAGNOSIS  Diagnosis of influenza is often made based on your history and a physical exam. A nose or throat swab test can be done to confirm the diagnosis. RISKS AND COMPLICATIONS You may be at risk for a more severe case of influenza if you smoke cigarettes, have diabetes, have chronic heart disease (such as heart failure) or lung disease (such as asthma), or if you have a weakened immune system. Elderly people and pregnant women are also at risk for more serious infections. The most common complication of influenza is a lung infection (pneumonia). Sometimes, this complication can require emergency medical care and may be life-threatening. PREVENTION  An annual influenza vaccination (flu shot) is the best way to avoid getting influenza. An annual flu shot is now routinely recommended for all adults in the [place] TREATMENT  In mild cases, influenza goes away on its own. Treatment is directed at relieving symptoms. For more severe cases, your caregiver may prescribe antiviral medicines to shorten the sickness. Antibiotic medicines are not effective, because the  infection is caused by a virus, not by bacteria. HOME CARE INSTRUCTIONS  Only take over-the-counter or prescription medicines for pain, discomfort, or fever as directed by your caregiver.  Use a cool mist humidifier to make breathing easier.  Get plenty of rest until your temperature returns to normal. This usually takes 3 to 4 days.  Drink enough fluids to keep your urine clear or pale yellow.  Cover your mouth and nose when coughing or sneezing, and wash your hands well to avoid spreading the virus.  Stay home from work or school until your fever has been gone for at least 1 full day. SEEK MEDICAL CARE IF:   You have chest pain or a deep cough that worsens or produces more mucus.  You have nausea, vomiting, or diarrhea. SEEK IMMEDIATE MEDICAL CARE IF:   You have difficulty breathing, shortness of breath, or your skin or nails turn bluish.  You have severe neck pain or stiffness.  You have a severe headache, facial pain, or earache.  You have a worsening or recurring fever.  You have nausea or vomiting that cannot be controlled. MAKE SURE YOU:  Understand these instructions.  Will watch your condition.  Will get help right away if you are not doing well or get worse. Document Released: 10/21/2000 Document Revised: 04/24/2012 Document Reviewed: 01/23/2012 [facility] Patient Information 2014 [place], [place].   If you're not feeling better in 7 days and please make an appointment to be seen again. Will call you if you're flu is positive in which case I will also call you and Tamiflu. Make sure to stay hydrated with in your  allowance of fluids for your end-stage renal disease.

## 2013-11-13 NOTE — Assessment & Plan Note (Addendum)
Influenza swab collected today. Will call patient with results. Encourage patient to treat the symptoms with Tylenol for pain or muscle aches and fluid, within his fluid restriction  for his end-stage renal disease, to stay hydrated. Discussed with patient that if his flu is positive I will call him back and prescribed Tamiflu. Otherwise he is to come in if he has any more issues, feels worse over the next couple days or if he is not feeling better within 7 days.

## 2013-11-14 ENCOUNTER — Telehealth: Payer: Self-pay | Admitting: Family Medicine

## 2013-11-14 NOTE — Telephone Encounter (Signed)
Please call [name] and inform him his flu panel labs collected yesterday were negative. He will need to treat the symptoms of his viral illness for now, make sure to stay hydrated (with in his allowance for ESRD). For any reason, he starts to feel worse he is to come in to be evaluated or go to ED. If he is not feeling better within a week he is to come back in to be seen. Thanks.

## 2013-11-14 NOTE — Telephone Encounter (Signed)
Spoke with patient and informed of below 

## 2013-12-18 ENCOUNTER — Inpatient Hospital Stay (HOSPITAL_COMMUNITY)
Admission: EM | Admit: 2013-12-18 | Discharge: 2013-12-20 | DRG: 865 | Disposition: A | Discharge status: Home / Self Care | Payer: Medicare Other | Attending: Family Medicine | Admitting: Family Medicine

## 2013-12-18 ENCOUNTER — Emergency Department (HOSPITAL_COMMUNITY): Payer: Medicare Other

## 2013-12-18 ENCOUNTER — Encounter (HOSPITAL_COMMUNITY): Payer: Self-pay | Admitting: Emergency Medicine

## 2013-12-18 DIAGNOSIS — E119 Type 2 diabetes mellitus without complications: Secondary | ICD-10-CM | POA: Diagnosis present

## 2013-12-18 DIAGNOSIS — N039 Chronic nephritic syndrome with unspecified morphologic changes: Secondary | ICD-10-CM

## 2013-12-18 DIAGNOSIS — D631 Anemia in chronic kidney disease: Secondary | ICD-10-CM | POA: Diagnosis present

## 2013-12-18 DIAGNOSIS — B9789 Other viral agents as the cause of diseases classified elsewhere: Principal | ICD-10-CM | POA: Diagnosis present

## 2013-12-18 DIAGNOSIS — Z7982 Long term (current) use of aspirin: Secondary | ICD-10-CM

## 2013-12-18 DIAGNOSIS — I120 Hypertensive chronic kidney disease with stage 5 chronic kidney disease or end stage renal disease: Secondary | ICD-10-CM | POA: Diagnosis present

## 2013-12-18 DIAGNOSIS — R69 Illness, unspecified: Secondary | ICD-10-CM

## 2013-12-18 DIAGNOSIS — Z79899 Other long term (current) drug therapy: Secondary | ICD-10-CM

## 2013-12-18 DIAGNOSIS — I2789 Other specified pulmonary heart diseases: Secondary | ICD-10-CM | POA: Diagnosis present

## 2013-12-18 DIAGNOSIS — R6510 Systemic inflammatory response syndrome (SIRS) of non-infectious origin without acute organ dysfunction: Secondary | ICD-10-CM | POA: Diagnosis present

## 2013-12-18 DIAGNOSIS — F3189 Other bipolar disorder: Secondary | ICD-10-CM | POA: Diagnosis present

## 2013-12-18 DIAGNOSIS — J111 Influenza due to unidentified influenza virus with other respiratory manifestations: Secondary | ICD-10-CM

## 2013-12-18 DIAGNOSIS — Z992 Dependence on renal dialysis: Secondary | ICD-10-CM

## 2013-12-18 DIAGNOSIS — Z905 Acquired absence of kidney: Secondary | ICD-10-CM

## 2013-12-18 DIAGNOSIS — N186 End stage renal disease: Secondary | ICD-10-CM

## 2013-12-18 DIAGNOSIS — E875 Hyperkalemia: Secondary | ICD-10-CM | POA: Diagnosis present

## 2013-12-18 DIAGNOSIS — R6889 Other general symptoms and signs: Secondary | ICD-10-CM

## 2013-12-18 DIAGNOSIS — I509 Heart failure, unspecified: Secondary | ICD-10-CM | POA: Diagnosis present

## 2013-12-18 HISTORY — DX: Influenza due to unidentified influenza virus with other respiratory manifestations: J11.1

## 2013-12-18 HISTORY — DX: Adverse effect of unspecified anesthetic, initial encounter: T41.45XA

## 2013-12-18 HISTORY — DX: Other complications of anesthesia, initial encounter: T88.59XA

## 2013-12-18 LAB — CBC WITH DIFFERENTIAL/PLATELET
Basophils Absolute: 0 10*3/uL (ref 0.0–0.1)
Basophils Relative: 0 % (ref 0–1)
Eosinophils Absolute: 0.4 10*3/uL (ref 0.0–0.7)
Eosinophils Relative: 11 % — ABNORMAL HIGH (ref 0–5)
HCT: 43.7 % (ref 39.0–52.0)
Hemoglobin: 14.1 g/dL (ref 13.0–17.0)
Lymphocytes Relative: 7 % — ABNORMAL LOW (ref 12–46)
Lymphs Abs: 0.3 10*3/uL — ABNORMAL LOW (ref 0.7–4.0)
MCH: 32.3 pg (ref 26.0–34.0)
MCHC: 32.3 g/dL (ref 30.0–36.0)
MCV: 100 fL (ref 78.0–100.0)
Monocytes Absolute: 0.4 10*3/uL (ref 0.1–1.0)
Monocytes Relative: 11 % (ref 3–12)
Neutro Abs: 2.7 10*3/uL (ref 1.7–7.7)
Neutrophils Relative %: 71 % (ref 43–77)
Platelets: 102 10*3/uL — ABNORMAL LOW (ref 150–400)
RBC: 4.37 MIL/uL (ref 4.22–5.81)
RDW: 14.1 % (ref 11.5–15.5)
WBC: 3.8 10*3/uL — ABNORMAL LOW (ref 4.0–10.5)

## 2013-12-18 LAB — COMPREHENSIVE METABOLIC PANEL
ALT: 23 U/L (ref 0–53)
AST: 17 U/L (ref 0–37)
Albumin: 4.3 g/dL (ref 3.5–5.2)
Alkaline Phosphatase: 254 U/L — ABNORMAL HIGH (ref 39–117)
BUN: 45 mg/dL — ABNORMAL HIGH (ref 6–23)
CO2: 24 mEq/L (ref 19–32)
Calcium: 10.9 mg/dL — ABNORMAL HIGH (ref 8.4–10.5)
Chloride: 98 mEq/L (ref 96–112)
Creatinine, Ser: 12.4 mg/dL — ABNORMAL HIGH (ref 0.50–1.35)
GFR calc Af Amer: 5 mL/min — ABNORMAL LOW (ref >90)
GFR calc non Af Amer: 4 mL/min — ABNORMAL LOW (ref >90)
Glucose, Bld: 90 mg/dL (ref 70–99)
Potassium: 5.7 mEq/L — ABNORMAL HIGH (ref 3.7–5.3)
Sodium: 143 mEq/L (ref 137–147)
Total Bilirubin: 0.6 mg/dL (ref 0.3–1.2)
Total Protein: 9.4 g/dL — ABNORMAL HIGH (ref 6.0–8.3)

## 2013-12-18 LAB — LIPASE, BLOOD: LIPASE: 137 U/L — AB (ref 11–59)

## 2013-12-18 LAB — PROCALCITONIN: PROCALCITONIN: 2.16 ng/mL

## 2013-12-18 LAB — CG4 I-STAT (LACTIC ACID): Lactic Acid, Venous: 1.86 mmol/L (ref 0.5–2.2)

## 2013-12-18 LAB — CK: Total CK: 495 U/L — ABNORMAL HIGH (ref 7–232)

## 2013-12-18 LAB — INFLUENZA PANEL BY PCR (TYPE A & B)
H1N1 flu by pcr: NOT DETECTED
Influenza A By PCR: NEGATIVE
Influenza B By PCR: NEGATIVE

## 2013-12-18 MED ORDER — OSELTAMIVIR PHOSPHATE 75 MG PO CAPS: 75.0000 mg | ORAL_CAPSULE | Freq: Two times a day (BID) | ORAL | Status: DC

## 2013-12-18 MED ORDER — ONDANSETRON HCL 4 MG/2ML IJ SOLN
INTRAMUSCULAR | Status: AC
  Administered 2013-12-18: 13:00:00
  Filled 2013-12-18: qty 2

## 2013-12-18 MED ORDER — SEVELAMER CARBONATE 2.4 G PO PACK
2.4000 g | PACK | Freq: Three times a day (TID) | ORAL | Status: DC | PRN
  Filled 2013-12-18 (×3): qty 1

## 2013-12-18 MED ORDER — ARIPIPRAZOLE 5 MG PO TABS
5.0000 mg | ORAL_TABLET | Freq: Every day | ORAL | Status: DC
  Administered 2013-12-19: 5 mg via ORAL
  Filled 2013-12-18 (×2): qty 1

## 2013-12-18 MED ORDER — PIPERACILLIN-TAZOBACTAM IN DEX 2-0.25 GM/50ML IV SOLN
2.2500 g | Freq: Three times a day (TID) | INTRAVENOUS | Status: DC
  Administered 2013-12-18 – 2013-12-19 (×2): 2.25 g via INTRAVENOUS
  Filled 2013-12-18 (×4): qty 50

## 2013-12-18 MED ORDER — CLONAZEPAM 1 MG PO TABS
1.5000 mg | ORAL_TABLET | Freq: Every day | ORAL | Status: DC
  Administered 2013-12-19: 09:00:00 1.5 mg via ORAL
  Filled 2013-12-18 (×2): qty 1

## 2013-12-18 MED ORDER — ONDANSETRON 8 MG/NS 50 ML IVPB
8.0000 mg | Freq: Three times a day (TID) | INTRAVENOUS | Status: DC | PRN
  Filled 2013-12-18: qty 8

## 2013-12-18 MED ORDER — SODIUM CHLORIDE 0.9 % IJ SOLN
3.0000 mL | Freq: Two times a day (BID) | INTRAMUSCULAR | Status: DC
Start: 2013-12-18 — End: 2013-12-20
  Administered 2013-12-18 – 2013-12-19 (×2): 3 mL via INTRAVENOUS

## 2013-12-18 MED ORDER — ACETAMINOPHEN 500 MG PO TABS
1000.0000 mg | ORAL_TABLET | Freq: Four times a day (QID) | ORAL | Status: DC | PRN
  Administered 2013-12-18: 1000 mg via ORAL
  Filled 2013-12-18: qty 2

## 2013-12-18 MED ORDER — PANTOPRAZOLE SODIUM 40 MG IV SOLR
40.0000 mg | Freq: Once | INTRAVENOUS | Status: AC
  Administered 2013-12-18: 40 mg via INTRAVENOUS
  Filled 2013-12-18: qty 40

## 2013-12-18 MED ORDER — HEPARIN SODIUM (PORCINE) 5000 UNIT/ML IJ SOLN
5000.0000 [IU] | Freq: Three times a day (TID) | INTRAMUSCULAR | Status: DC
  Administered 2013-12-18 – 2013-12-19 (×4): 5000 [IU] via SUBCUTANEOUS
  Filled 2013-12-18 (×9): qty 1

## 2013-12-18 MED ORDER — SODIUM CHLORIDE 0.9 % IV BOLUS (SEPSIS)
500.0000 mL | Freq: Once | INTRAVENOUS | Status: AC
  Administered 2013-12-18: 500 mL via INTRAVENOUS

## 2013-12-18 MED ORDER — SODIUM CHLORIDE 0.9 % IV SOLN
INTRAVENOUS | Status: DC
  Administered 2013-12-18 – 2013-12-19 (×2): via INTRAVENOUS

## 2013-12-18 MED ORDER — PANTOPRAZOLE SODIUM 40 MG PO TBEC
40.0000 mg | DELAYED_RELEASE_TABLET | Freq: Every day | ORAL | Status: DC
  Administered 2013-12-19: 40 mg via ORAL
  Filled 2013-12-18 (×2): qty 1

## 2013-12-18 MED ORDER — OSELTAMIVIR PHOSPHATE 30 MG PO CAPS: 30.0000 mg | ORAL_CAPSULE | ORAL | Status: DC

## 2013-12-18 MED ORDER — LANTHANUM CARBONATE 500 MG PO CHEW
1000.0000 mg | CHEWABLE_TABLET | Freq: Three times a day (TID) | ORAL | Status: DC
  Administered 2013-12-19 (×2): 1000 mg via ORAL
  Filled 2013-12-18 (×8): qty 2

## 2013-12-18 MED ORDER — CINACALCET HCL 30 MG PO TABS: 120.0000 mg | ORAL_TABLET | Freq: Every day | ORAL | Status: DC

## 2013-12-18 MED ORDER — SODIUM POLYSTYRENE SULFONATE 15 GM/60ML PO SUSP: 30.0000 g | Freq: Once | ORAL | Status: DC

## 2013-12-18 MED ORDER — LANTHANUM CARBONATE 500 MG PO CHEW: 1000.0000 mg | CHEWABLE_TABLET | Freq: Two times a day (BID) | ORAL | Status: DC | PRN

## 2013-12-18 MED ORDER — ACETAMINOPHEN 500 MG PO TABS
1000.0000 mg | ORAL_TABLET | Freq: Once | ORAL | Status: AC
  Administered 2013-12-18: 1000 mg via ORAL
  Filled 2013-12-18: qty 2

## 2013-12-18 MED ORDER — CARVEDILOL 6.25 MG PO TABS
6.2500 mg | ORAL_TABLET | Freq: Two times a day (BID) | ORAL | Status: DC
  Administered 2013-12-18 – 2013-12-19 (×3): 6.25 mg via ORAL
  Filled 2013-12-18 (×5): qty 1

## 2013-12-18 MED ORDER — CINACALCET HCL 30 MG PO TABS
120.0000 mg | ORAL_TABLET | Freq: Every day | ORAL | Status: DC
  Filled 2013-12-18 (×3): qty 4

## 2013-12-18 MED ORDER — VANCOMYCIN HCL 10 G IV SOLR
2500.0000 mg | Freq: Once | INTRAVENOUS | Status: AC
  Administered 2013-12-18: 2500 mg via INTRAVENOUS
  Filled 2013-12-18: qty 2500

## 2013-12-18 MED ORDER — ASPIRIN EC 81 MG PO TBEC
81.0000 mg | DELAYED_RELEASE_TABLET | Freq: Every day | ORAL | Status: DC
  Administered 2013-12-19: 81 mg via ORAL
  Filled 2013-12-18 (×2): qty 1

## 2013-12-18 NOTE — ED Provider Notes (Signed)
CSN: [id number]     Arrival date & time 12/18/13  1008 History   First MD Initiated Contact with Patient 12/18/13 1013     Chief Complaint  Patient presents with  . Influenza     (Consider location/radiation/quality/duration/timing/severity/associated sxs/prior Treatment) HPI Pt is a 43yo male with hx of DM, HTN, CHF, and ESRD on hemodialysis M/W/F.  Pt sent from dialysis to ED for further evaluation of fever, chills, nausea and vomiting. Pt was given acetaminophen at dialysis around 7am this morning. Pt c/o chills, nausea, and vomiting x3. States he woke up feeling fatigued prior to dialysis and became nauseous upon arrival to clinic. States symptoms worsened once tx started. Pt able to complete 2.5hrs tx.  States he was feeling well yesterday. Denies chest pain, cough, SOB, congestion, abdominal pain, diarrhea. Denies sick contacts or recent travel. Reports getting influenza and pneumonia vaccine this year.    PCP: Dr. Howard Pouch, Family Medicine   Past Medical History  Diagnosis Date  . Hypertension   . Anemia   . Depression   . Blood transfusion   . CHF (congestive heart failure)   . ESRD on hemodialysis 07/13/2011    Started hemodialysis in 2006. Gets HD MWF in [facility] with [facility]. L forearm AVF.     Past Surgical History  Procedure Laterality Date  . Cholecystectomy    . Angioplasty    . Dialysis fistula creation    . Knee surgery    . Mouth surgery    . Nephrectomy transplanted organ     Family History  Problem Relation Age of Onset  . Arthritis Mother   . Hypertension Mother   . Diabetes Mother   . Arthritis Father   . Hypertension Father   . Diabetes Father    History  Substance Use Topics  . Smoking status: Never Smoker   . Smokeless tobacco: Not on file  . Alcohol Use: 0.5 oz/week    1 drink(s) per week     Comment: drinks liquor once a month    Review of Systems  Constitutional: Positive for fever and chills.  Respiratory: Negative for  cough and shortness of breath.   Cardiovascular: Negative for chest pain.  Gastrointestinal: Positive for nausea and vomiting. Negative for abdominal pain and diarrhea.  Musculoskeletal: Negative for back pain.  All other systems reviewed and are negative.      Allergies  Delacort; Benicar; Food; Hydralazine; and Nitrates, organic  Home Medications   Current Outpatient Rx  Name  Route  Sig  Dispense  Refill  . ARIPiprazole (ABILIFY) 5 MG tablet   Oral   Take 5 mg by mouth daily.         Marland Kitchen aspirin EC 81 MG tablet   Oral   Take 81 mg by mouth daily.         . carvedilol (COREG) 25 MG tablet   Oral   Take 12.5 mg by mouth 2 (two) times daily.          . cinacalcet (SENSIPAR) 60 MG tablet   Oral   Take 120 mg by mouth daily.          . clonazePAM (KLONOPIN) 1 MG tablet   Oral   Take 1.5 mg by mouth daily. 1 and 1/2  tabs         . lanthanum (FOSRENOL) 1000 MG chewable tablet   Oral   Chew 1,000 mg by mouth 2 (two) times daily as needed (phosphate binder).  Chew 2,000  with each meal         . lidocaine (LMX) 4 % cream   Topical   Apply 1 application topically daily as needed (when going to dialysis).         . ranitidine (ZANTAC) 150 MG capsule   Oral   Take 150 mg by mouth every evening.           . Sevelamer Carbonate (RENVELA) 2.4 G PACK   Oral   Take 0.4 g by mouth daily as needed (phosphate binder).          . vardenafil (LEVITRA) 20 MG tablet   Oral   Take 20 mg by mouth daily as needed for erectile dysfunction.          BP 156/99  Pulse 125  Temp(Src) 102.5 F (39.2 C) (Oral)  Resp 18  SpO2 98% Physical Exam  Nursing note and vitals reviewed. Constitutional: He appears well-developed and well-nourished.  Pt shaking and visible chills on skin  HENT:  Head: Normocephalic and atraumatic.  Eyes: Conjunctivae are normal. No scleral icterus.  Neck: Normal range of motion. Neck supple.  Cardiovascular: Normal rate, regular rhythm  and normal heart sounds.   Pulmonary/Chest: Effort normal and breath sounds normal. No respiratory distress. He has no wheezes. He has no rales. He exhibits no tenderness.  No respiratory distress, able to speak in full sentences w/o difficulty. Lungs: CTAB  Abdominal: Soft. Bowel sounds are normal. He exhibits no distension and no mass. There is no tenderness. There is no rebound and no guarding.  Musculoskeletal: Normal range of motion.  Neurological: He is alert.  Skin: Skin is warm and dry.    ED Course  Procedures (including critical care time) Labs Review Labs Reviewed  CBC WITH DIFFERENTIAL - Abnormal; Notable for the following:    WBC 3.8 (*)    Platelets 102 (*)    Lymphocytes Relative 7 (*)    Lymphs Abs 0.3 (*)    Eosinophils Relative 11 (*)    All other components within normal limits  COMPREHENSIVE METABOLIC PANEL - Abnormal; Notable for the following:    Potassium 5.7 (*)    BUN 45 (*)    Creatinine, Ser 12.40 (*)    Calcium 10.9 (*)    Total Protein 9.4 (*)    Alkaline Phosphatase 254 (*)    GFR calc non Af Amer 4 (*)    GFR calc Af Amer 5 (*)    All other components within normal limits  CULTURE, BLOOD (ROUTINE X 2)  CULTURE, BLOOD (ROUTINE X 2)  INFLUENZA PANEL BY PCR (TYPE A & B, H1N1)  CG4 I-STAT (LACTIC ACID)   Imaging Review Dg Chest 2 View  12/18/2013   CLINICAL DATA:  Cough  EXAM: CHEST  2 VIEW  COMPARISON:  Prior chest x-ray 01/07/2013  FINDINGS: Significantly improved appearance of the cardiopericardial silhouette. The heart silhouette is now within normal limits. Pulmonary aeration is also improved. There is mild central airway thickening and peribronchial cuffing which is similar compared to prior. No focal airspace consolidation, pleural effusion or pneumothorax. Surgical clips in the right upper quadrant suggest prior cholecystectomy. No acute osseous abnormality.  IMPRESSION: 1. No active cardiopulmonary process. 2. Significant interval improvement  in the previously identified enlargement of the cardiopericardial silhouette. This likely reflects resolution of pericardial effusion. 3. Mild residual central bronchitic changes and interstitial prominence likely reflect chronic changes.   Electronically Signed   By: Dellis Filbert.D.  On: 12/18/2013 11:17    EKG Interpretation   None       MDM   Final diagnoses:  Influenza-like symptoms    Pt with ESRD sent over from dialysis prior to completing tx due to fever, chills, n/v. Pt had temp of 100.9 upon arrival. Pt stated he was given acetaminophen in at dialysis PTA.    Labs, CBC and CMP: consistent with not completing dialysis. Pt states he does not make urine, cannot check for UTI. CXR: unremarkable. Cause of fever is unknown at this time.   Influenza PCR sent off Will get blood cultures and lactic acid.  Pt will need to be admitted for further evaluation and tx.  Discussed pt with Dr. Thurnell Garbe who agrees with plan.  Temp-102.5 in ED, Pt also tachycardic. Pt does meet SIRS criteria. 1000mg  acetaminophen will be given.   Consulted with Dr. Paulla Fore, family medicine, who agreed to admit pt for influenza-like illness.     Noland Fordyce, PA-C 12/18/13 1244

## 2013-12-18 NOTE — ED Notes (Signed)
MD at bedside. 

## 2013-12-18 NOTE — ED Notes (Signed)
Pt from [facility], c/o n/v and generalized weakness. Pt is dialysis pt, had 2.5 hrs tx. Sent to ucc for eval for chills and shaking. Pt denies pain. Temp 100.9

## 2013-12-18 NOTE — H&P (Signed)
[facility] Admission History and Physical Service Pager: [phone]  Patient name: [name] Medical record number: [id number] Date of birth: 08-27-1971 Age: 43 y.o. Gender: male  Primary Care Provider: Ma Hillock, DO Consultants: Nephrology Code Status: Full  Chief Complaint: Rigors, Chills, Body aches  Assessment and Plan: [name] is a 43 y.o. year old male presenting with acute onset of rigors, fevers, chills and body aches during dialysis. PMH is significant for ESRD s/p rejected renal transplant on HD MWF in [place]  # SIRS & ID: Febrile, Tachycardic, Tachypenia.  No obvious source of infection but consider Influenza vs Bacteremia.  Lactate reassuring and BP WNL.  Now with non-bloody/bilious vomiting.  Benign abd exam, no acute CXR findings. - 500cc NS bolus given dry mucous membranes. - Tamiflu, Vanco & Zosyn emperical given presentation - Check Flu PCR and Procalcitonin.  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  S/p renal transplant failed, now on MWF HD at [facility] facility; waiting list for Transplant at [facility] (may have potential doner). - Renal (Dr. Melvia Heaps to see) - will give 500cc bolus as appears volume depleted, not volume overloaded.  - Graft does not appear infected - Likely needs dialysis again today given elevated potassium  # Anemia: Stable - per nephrology  # Hyperkalemia: - per nephrology  # HTN, Hx of CHF, Pulmonary HTN: On Coreg.   - Reduced Coreg dose given potential sepsis picture - Continue ASA  # PSYCH (BiPolar): - continue home   FEN/GI: Renal Diet, PPI Prophylaxis: Heparin  Disposition: To Tele, Bacteremia/SEPSIS evaluation.  Nephrology to see for consideration of dialysis  History of Present Illness: [name] is a  43 y.o. year old male presenting with acute onset of chills, fatigue, rigors while in dialysis early this morning.  Left dialysis after 2.5 hours and presented to UC with similar symptoms but new onset of fever and rigors.  Sent to [facility] ED where found to have fever to 102.5oF and recurrent vomiting.  VSS and X-ray unrevealing for PNA.  FMTS called for admission.  Review Of Systems: Per HPI with the following additions:  Denies abdominal or chest pain.  No increased work of breathing, no cough, or congestion.  Feeling well prior to this morning.  Recently treated for "UTI" last month which was emperic tx for GC although GC probe obtained at outside facility negative.  Currently anuric for >1 year.  No superpubic tenderness.  Hx of CHF (most recent LVEF 55-65%) without CAD on [facility] 10/01/2013,   Otherwise 12 point review of systems was performed and was unremarkable.  Patient Active Problem List   Diagnosis Date Noted  . Influenza-like illness 12/18/2013  . Acute upper respiratory infections of unspecified site 11/13/2013  . Health care maintenance 09/02/2013  . Hypoglycemia 01/11/2013  . Respiratory failure with hypoxia 01/06/2013  . Pulmonary edema 01/06/2013  . Erectile dysfunction 06/11/2012  . Bipolar 2 disorder 08/18/2011  . ESRD on hemodialysis 07/13/2011  . Congestive heart failure (CHF) 07/13/2011  . Anemia, chronic disease 07/13/2011  . Pulmonary hypertension associated with end stage renal disease on dialysis 07/13/2011  . Hypertension 07/13/2011  . Swollen testicle 07/13/2011   HISTORY: Past Medical History  Diagnosis Date  . Hypertension   . Anemia   .  Depression   . Blood transfusion   . CHF (congestive heart failure)   . ESRD on hemodialysis 07/13/2011    Started hemodialysis in 2006. Gets HD MWF in [facility] with [facility]. L forearm AVF.     Past Surgical History  Procedure Laterality Date  . Cholecystectomy    . Angioplasty    . Dialysis fistula creation    .  Knee surgery    . Mouth surgery    . Nephrectomy transplanted organ     Social Hx: History   Social History  . Marital Status: Single    Spouse Name: N/A    Number of Children: N/A  . Years of Education: N/A   Social History Main Topics  . Smoking status: Never Smoker   . Smokeless tobacco: None  . Alcohol Use: 0.5 oz/week    1 drink(s) per week     Comment: drinks liquor once a month  . Drug Use: No  . Sexual Activity: Yes   Other Topics Concern  . None   Social History Narrative  . None   Family History  Problem Relation Age of Onset  . Arthritis Mother   . Hypertension Mother   . Diabetes Mother   . Arthritis Father   . Hypertension Father   . Diabetes Father    Allergies  Allergen Reactions  . Delacort [Hydrocortisone Base] Nausea Only  . Benicar [Olmesartan]     Unknown  . Food Itching    bananas  . Hydralazine     Head ache  . Nitrates, Organic Other (See Comments)    Palpitations, headache   Home Medications: Prior to Admission medications   Medication Sig Start Date End Date Taking? Authorizing Provider  ARIPiprazole (ABILIFY) 5 MG tablet Take 5 mg by mouth daily.   Yes Historical Provider, MD  aspirin EC 81 MG tablet Take 81 mg by mouth daily.   Yes Historical Provider, MD  carvedilol (COREG) 25 MG tablet Take 12.5 mg by mouth 2 (two) times daily.    Yes Historical Provider, MD  cinacalcet (SENSIPAR) 60 MG tablet Take 120 mg by mouth daily.    Yes Historical Provider, MD  clonazePAM (KLONOPIN) 1 MG tablet Take 1.5 mg by mouth daily. 1 and 1/2  tabs   Yes Historical Provider, MD  lanthanum (FOSRENOL) 1000 MG chewable tablet Chew 1,000 mg by mouth 2 (two) times daily as needed (phosphate binder). Chew 2,000  with each meal   Yes Historical Provider, MD  lidocaine (LMX) 4 % cream Apply 1 application topically daily as needed (when going to dialysis).   Yes Historical Provider, MD  ranitidine (ZANTAC) 150 MG capsule Take 150 mg by mouth every evening.      Yes Historical Provider, MD  Sevelamer Carbonate (RENVELA) 2.4 G PACK Take 0.4 g by mouth daily as needed (phosphate binder).    Yes Historical Provider, MD  vardenafil (LEVITRA) 20 MG tablet Take 20 mg by mouth daily as needed for erectile dysfunction.   Yes Historical Provider, MD    OBJECTIVE: Temp:  [100.9 F (38.3 C)-102.5 F (39.2 C)] 102.3 F (39.1 C) (02/11 1356) Pulse Rate:  [121-131] 121 (02/11 1330) Resp:  [18-35] 31 (02/11 1330) BP: (126-156)/(83-99) 126/83 mmHg (02/11 1330) SpO2:  [97 %-98 %] 97 % (02/11 1330) Weight:  [233 lb 11 oz (106 kg)] 233 lb 11 oz (106 kg) (02/11 1330) Body mass index is 29.21 kg/(m^2). BASELINE WEIGHT:  [place]  12/18/13 1330  Weight: 233 lb 11 oz (106 kg)   No intake or output data in the 24 hours ending 12/18/13 1427   Physical Exam:  GENERAL: Adult obese AA  male. In moderate discomfort; no respiratory distress.  Ill appearing but non-toxic.  Multiple vomiting episodes during exam  PSYCH: alert and appropriate, good insight   HNEENT: No JVD, prominent carotid upstroke, short broad based neck  CARDIO: Tachycardic, no murmur appreciated  LUNGS: CTA B in A/P lung fields  ABDOMEN: +BS, obese, soft, non-tender, no rebound or guarding to deep palpation in all quadrants  EXTREM:  Warm, well perfused.  Moves all 4 extremities spontaneously; no lateralization.  No noted foot lesions.  Distal pulses bounding.  no pretibial edema.  GU:   SKIN: No skin lesions appreciated.  Pt denied sacral wound but did not examine   LABS:  Basic Labs Extended:   Recent Labs Lab 12/18/13 1040  WBC 3.8*  HGB 14.1  HCT 43.7  PLT 102*    Recent Labs Lab 12/18/13 1040  NA 143  K 5.7*  CL 98  CO2 24  BUN 45*  CREATININE 12.40*  GLUCOSE 90  CALCIUM 10.9*    Recent Labs Lab 12/18/13 1040  ALBUMIN 4.3  ALT 23  AST 17  ALKPHOS 254*  BILITOT 0.6    Recent Labs Lab 12/18/13 1230  LATICACIDVEN 1.86     Urinalysis: Further Urine  Studies:  Not obtained - pt Anuric    IMAGING: 2/11 2V CXR: No PNA or volume overload  MICRO: ABX: MICRO:  2/11 - VANCO 2/11 - Zosyn 2/11 - Tamiflu 2/11 - Blood Cx X 2 >>> pending   CURRENT MEDICATIONS: Continuous meds: . sodium chloride 10 mL/hr at 12/18/13 1356   Scheduled meds: [START ON 12/19/2013] ARIPiprazole 5 mg Oral Daily  [START ON 12/19/2013] aspirin EC 81 mg Oral Daily  carvedilol 6.25 mg Oral BID  [START ON 12/19/2013] cinacalcet 120 mg Oral Q breakfast  [START ON 12/19/2013] clonazePAM 1.5 mg Oral Daily  heparin 5,000 Units Subcutaneous 3 times per day  oseltamivir 75 mg Oral BID  pantoprazole (PROTONIX) IV 40 mg Intravenous Once  Followed by     [START ON 12/19/2013] pantoprazole 40 mg Oral Daily  piperacillin-tazobactam (ZOSYN)  IV 2.25 g Intravenous 3 times per day  sodium chloride 3 mL Intravenous Q12H  vancomycin 2,500 mg Intravenous Once   PRN meds: acetaminophen 1,000 mg Q6H PRN  ondansetron (ZOFRAN) IV 8 mg Q8H PRN  sevelamer carbonate 2.4 g Daily PRN    Gerda Diss, DO [facility] Resident - PGY- 3  12/18/2013 2:27 PM

## 2013-12-18 NOTE — ED Notes (Signed)
Pt actively vomiting. Dr. Paulla Fore at bedside, verbal order for IV zofran. Pt given vomit bag and cold wash cloth.

## 2013-12-18 NOTE — Consult Note (Signed)
Renal Service Consult Note Cutter 12/18/2013 [place] D Requesting Physician:  Dr Erin Hearing  Reason for Consult:  ESRD patient with fever HPI: The patient is a 43 y.o. year-old with hx of HTN, anemia, ESRD , failed renal transplant.  Pt felt bad prior to HD this am with chills primarily.  He went to HD and finished the session but felt worse and worse through the treatment and after HD went to ED.  He had a temp of 102.5.  He has been admitted and has received IV abx and is feeling a little better.  Vomited several days earlier as well, but no abd pain or diarrhea.  Felt fine until this am.  No access problems or recent access procedures.   No prod cough, CP, rash or confusion. No HA  ROS  no jt pain, no back pain  no sore throat  no fistula problems of late  Past Medical History  Past Medical History  Diagnosis Date  . Hypertension   . Anemia   . Depression   . Blood transfusion   . CHF (congestive heart failure)   . ESRD on hemodialysis 07/13/2011    Started hemodialysis in Feb 01, 2005. Gets HD MWF in [facility] with [facility]. L forearm AVF.    Marland Kitchen Complication of anesthesia     Woke up intubated gets combative  afraid to be alone   . ESRD on hemodialysis 07/13/2011    Glomerulonephritis at age 26, started HD in 02/02/95.  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  Is back on transplant list as of Feb 2015.  Gets HD MWF in [facility] with [facility]. L forearm AVF    Past Surgical History  Past Surgical History  Procedure Laterality Date  . Cholecystectomy    . Angioplasty    . Dialysis fistula creation    . Knee surgery    . Mouth surgery    . Nephrectomy transplanted organ     Family History  Family History  Problem Relation Age of Onset  . Arthritis Mother   . Hypertension Mother   . Diabetes Mother   . Arthritis Father   . Hypertension Father   . Diabetes Father    Social  History  reports that he has never smoked. He has never used smokeless tobacco. He reports that he drinks about 0.5 ounces of alcohol per week. He reports that he does not use illicit drugs. Allergies  Allergies  Allergen Reactions  . Delacort [Hydrocortisone Base] Nausea Only  . Benicar [Olmesartan]     Unknown  . Food Itching    bananas  . Hydralazine     Head ache  . Nitrates, Organic Other (See Comments)    Palpitations, headache   Home medications Prior to Admission medications   Medication Sig Start Date End Date Taking? Authorizing Provider  ARIPiprazole (ABILIFY) 5 MG tablet Take 5 mg by mouth daily.   Yes Historical Provider, MD  aspirin EC 81 MG tablet Take 81 mg by mouth daily.   Yes Historical Provider, MD  carvedilol (COREG) 25 MG tablet Take 12.5 mg by mouth 2 (two) times daily.    Yes Historical Provider, MD  cinacalcet (SENSIPAR) 60 MG tablet Take 120 mg by mouth daily.    Yes Historical Provider, MD  clonazePAM (KLONOPIN) 1 MG tablet Take 1.5 mg by mouth daily. 1 and 1/2  tabs   Yes Historical  Provider, MD  lanthanum (FOSRENOL) 1000 MG chewable tablet Chew 1,000 mg by mouth 3 (three) times daily with meals.   Yes Historical Provider, MD  lidocaine (LMX) 4 % cream Apply 1 application topically daily as needed (when going to dialysis).   Yes Historical Provider, MD  ranitidine (ZANTAC) 150 MG capsule Take 150 mg by mouth every evening.     Yes Historical Provider, MD  sevelamer carbonate (RENVELA) 2.4 G PACK 2.4 g 3 (three) times daily with meals as needed (take with snacks).   Yes Historical Provider, MD  vardenafil (LEVITRA) 20 MG tablet Take 20 mg by mouth daily as needed for erectile dysfunction.   Yes Historical Provider, MD   Liver Function Tests  Recent Labs Lab 12/18/13 1040  AST 17  ALT 23  ALKPHOS 254*  BILITOT 0.6  PROT 9.4*  ALBUMIN 4.3   No results found for this basename: LIPASE, AMYLASE,  in the last 168 hours CBC  Recent Labs Lab  12/18/13 1040  WBC 3.8*  NEUTROABS 2.7  HGB 14.1  HCT 43.7  MCV 100.0  PLT [id number]*   Basic Metabolic Panel  Recent Labs Lab 12/18/13 1040  NA 143  K 5.7*  CL 98  CO2 24  GLUCOSE 90  BUN 45*  CREATININE 12.40*  CALCIUM 10.9*    Exam  Blood pressure 119/76, pulse 117, temperature 101.4 F (38.6 C), temperature source Oral, resp. rate 22, height 6\' 3"  (1.905 m), weight 106 kg (233 lb 11 oz), SpO2 97.00%. Alert, no distress No rash, cyanosis or gangrene Sclera anicteric, throat clear No jvd Chest clear bilat RRR no MRG Abd soft, nd/nt, no mass or ascites GU nl male genitalia No LE edema L forearm AVF intact wtihout sign of infections   Dialysis: MWF [place] [facility] 4[facility]    2K/2.5Bath   227 lbs dry wt   Heparin none  AVF LUA Hectorol 4   EPO none    IV Fe none   Assessment: 1 Fever / SIRS- concern for bacteremia with no focal symptoms.  Access with no signs of infection.  Also could be viral, flu, etc., pt is on empiric abx per primary, blood cx's and urine cx's ordered.  CXR negative.   2 ESRD on HD 3 Hx failed renal Tx 4 Hyperkalemia- check cpk, repeat in am 5 HTN/volume- up 3-4 kg by wts, BP's low to normal. UF to dry wt next HD 6 Bipolar- on medication   Plan- vit D, HD MWF, f/u cx results, empiric abx   Kelly Splinter MD (pgr) [phone]    ([phone] 12/18/2013, 4:37 PM

## 2013-12-18 NOTE — ED Notes (Signed)
Lactic acid given to Admitting MD and O'Maily PA

## 2013-12-18 NOTE — Progress Notes (Signed)
ANTIBIOTIC CONSULT NOTE - INITIAL  Pharmacy Consult for vancomycin and Zosyn Indication: rule out sepsis  Allergies  Allergen Reactions  . Delacort [Hydrocortisone Base] Nausea Only  . Benicar [Olmesartan]     Unknown  . Food Itching    bananas  . Hydralazine     Head ache  . Nitrates, Organic Other (See Comments)    Palpitations, headache    Patient Measurements: Height: 6\' 3"  (190.5 cm) Weight: 233 lb 11 oz (106 kg) IBW/kg (Calculated) : 84.5 Adjusted Body Weight: 102kg  Vital Signs: Temp: 102.5 F (39.2 C) (02/11 1228) Temp src: Oral (02/11 1228) BP: 126/83 mmHg (02/11 1330) Pulse Rate: 121 (02/11 1330) Intake/Output from previous day:   Intake/Output from this shift:    Labs:  Recent Labs  12/18/13 1040  WBC 3.8*  HGB 14.1  PLT 102*  CREATININE 12.40*   Estimated Creatinine Clearance: 10.4 ml/min (by C-G formula based on Cr of 12.4). No results found for this basename: VANCOTROUGH, VANCOPEAK, VANCORANDOM, GENTTROUGH, GENTPEAK, GENTRANDOM, TOBRATROUGH, TOBRAPEAK, TOBRARND, AMIKACINPEAK, AMIKACINTROU, AMIKACIN,  in the last 72 hours   Microbiology: No results found for this or any previous visit (from the past 720 hour(s)).  Medical History: Past Medical History  Diagnosis Date  . Hypertension   . Anemia   . Depression   . Blood transfusion   . CHF (congestive heart failure)   . ESRD on hemodialysis 07/13/2011    Started hemodialysis in 2006. Gets HD MWF in [facility] with [facility]. L forearm AVF.      Assessment: 67 YOM with history of ESRD s/p rejected transplant on HD MWF who is seen in the ED with rigors, fevers/chills and body aches during HD. WBC 3.8, Tmax 102.5.  Goal of Therapy:  Pre-HD Vancomycin level 15-74mcg/mL  Plan:  1. Vancomycin loading dose with 2500mg  IV x1 2. Zosyn 2.25gm IV q8h 3. Follow up for HD schedule for future vancomycin doses- qualifies for 1000mg  IV after each HD session 4. Follow clinical progression, c/s,  LOT, levels when appropriate  Adelaine Roppolo D. Letricia Krinsky, PharmD, BCPS Clinical Pharmacist Pager: [phone] 12/18/2013 2:21 PM

## 2013-12-18 NOTE — ED Notes (Signed)
Pt given 4mg  of zofran IV.

## 2013-12-18 NOTE — ED Notes (Signed)
Admitting physician at bedside

## 2013-12-19 DIAGNOSIS — Z992 Dependence on renal dialysis: Secondary | ICD-10-CM

## 2013-12-19 DIAGNOSIS — R6889 Other general symptoms and signs: Secondary | ICD-10-CM

## 2013-12-19 DIAGNOSIS — N186 End stage renal disease: Secondary | ICD-10-CM

## 2013-12-19 LAB — RENAL FUNCTION PANEL
Albumin: 3.2 g/dL — ABNORMAL LOW (ref 3.5–5.2)
BUN: 66 mg/dL — AB (ref 6–23)
CALCIUM: 9.6 mg/dL (ref 8.4–10.5)
CO2: 21 mEq/L (ref 19–32)
CREATININE: 15.75 mg/dL — AB (ref 0.50–1.35)
Chloride: 99 mEq/L (ref 96–112)
GFR calc Af Amer: 4 mL/min — ABNORMAL LOW (ref >90)
GFR calc non Af Amer: 3 mL/min — ABNORMAL LOW (ref >90)
GLUCOSE: 103 mg/dL — AB (ref 70–99)
PHOSPHORUS: 5 mg/dL — AB (ref 2.3–4.6)
Potassium: 6.9 mEq/L (ref 3.7–5.3)
SODIUM: 138 meq/L (ref 137–147)

## 2013-12-19 LAB — POTASSIUM: Potassium: 3.4 mEq/L — ABNORMAL LOW (ref 3.7–5.3)

## 2013-12-19 LAB — HIV ANTIBODY (ROUTINE TESTING W REFLEX): HIV: NONREACTIVE

## 2013-12-19 LAB — HEPATITIS B SURFACE ANTIGEN: Hepatitis B Surface Ag: NEGATIVE

## 2013-12-19 MED ORDER — LIDOCAINE-PRILOCAINE 2.5-2.5 % EX CREA: 1.0000 "application " | TOPICAL_CREAM | CUTANEOUS | Status: DC | PRN

## 2013-12-19 MED ORDER — PENTAFLUOROPROP-TETRAFLUOROETH EX AERO
1.0000 | INHALATION_SPRAY | CUTANEOUS | Status: DC | PRN
Start: 2013-12-19 — End: 2013-12-20

## 2013-12-19 MED ORDER — VANCOMYCIN HCL IN DEXTROSE 1-5 GM/200ML-% IV SOLN
1000.0000 mg | INTRAVENOUS | Status: DC
  Filled 2013-12-19: qty 200

## 2013-12-19 MED ORDER — DEXTROSE 5 % IV SOLN
1.0000 g | INTRAVENOUS | Status: DC
  Administered 2013-12-19: 1 g via INTRAVENOUS
  Filled 2013-12-19 (×2): qty 10

## 2013-12-19 MED ORDER — LIDOCAINE HCL (PF) 1 % IJ SOLN: 5.0000 mL | INTRAMUSCULAR | Status: DC | PRN

## 2013-12-19 MED ORDER — HEPARIN SODIUM (PORCINE) 1000 UNIT/ML DIALYSIS
20.0000 [IU]/kg | INTRAMUSCULAR | Status: DC | PRN
  Filled 2013-12-19: qty 3

## 2013-12-19 MED ORDER — SODIUM CHLORIDE 0.9 % IV SOLN: 100.0000 mL | INTRAVENOUS | Status: DC | PRN

## 2013-12-19 MED ORDER — VANCOMYCIN HCL IN DEXTROSE 1-5 GM/200ML-% IV SOLN
1000.0000 mg | Freq: Once | INTRAVENOUS | Status: AC
  Administered 2013-12-19: 1000 mg via INTRAVENOUS
  Filled 2013-12-19 (×3): qty 200

## 2013-12-19 MED ORDER — ALTEPLASE 2 MG IJ SOLR
2.0000 mg | Freq: Once | INTRAMUSCULAR | Status: AC | PRN
  Filled 2013-12-19: qty 2

## 2013-12-19 MED ORDER — HEPARIN SODIUM (PORCINE) 1000 UNIT/ML DIALYSIS
1000.0000 [IU] | INTRAMUSCULAR | Status: DC | PRN
  Filled 2013-12-19: qty 1

## 2013-12-19 MED ORDER — NEPRO/CARBSTEADY PO LIQD: 237.0000 mL | ORAL | Status: DC | PRN

## 2013-12-19 NOTE — Progress Notes (Signed)
  [facility] Progress Note   Subjective: Temps coming down, Blood cx x 2 are neg so far  Filed Vitals:   12/19/13 0846 12/19/13 0851 12/19/13 0930 12/19/13 1000  BP: 131/80 115/65 109/66 96/57  Pulse: 100 100 100 106  Temp: 98.4 F (36.9 C)     TempSrc: Oral     Resp: 20     Height:      Weight: 105.5 kg (232 lb 9.4 oz)     SpO2: 95%     Exam Alert, calm, no distress, on HD No jvd  Chest clear bilat  RRR no MRG  Abd soft, nd/nt, no mass or ascites  No LE edema, no joint effusion, no gangrene or ulcers L forearm AVF intact wtihout sign of infections   Dialysis: MWF [place] [facility]  4h   2K/2.5Bath   227 lbs dry wt   Heparin none   AVF LUA  Hectorol 4    EPO none   IV Fe none   Assessment:  1 Fever / SIRS- blood cx's neg so far, temps coming down, getting empiric abx. HD access no signs of gross infection  2 ESRD on HD  3 Hx failed renal Tx  4 Hyperkalemia- K+ 6.9 today 5 HTN/volume- up 2-3 kg by wts, BP's low to normal, UF to dry wt  6 Bipolar- on medication    Plan- extra HD today with low K bath for hyperkalemia   Kelly Splinter MD pager [phone]    cell [phone] 12/19/2013, 10:21 AM   Recent Labs Lab 12/18/13 1040 12/19/13 0452  NA 143 138  K 5.7* 6.9*  CL 98 99  CO2 24 21  GLUCOSE 90 103*  BUN 45* 66*  CREATININE 12.40* 15.75*  CALCIUM 10.9* 9.6  PHOS  --  5.0*    Recent Labs Lab 12/18/13 1040 12/19/13 0452  AST 17  --   ALT 23  --   ALKPHOS 254*  --   BILITOT 0.6  --   PROT 9.4*  --   ALBUMIN 4.3 3.2*    Recent Labs Lab 12/18/13 1040  WBC 3.8*  NEUTROABS 2.7  HGB 14.1  HCT 43.7  MCV 100.0  PLT 102*   . ARIPiprazole  5 mg Oral Daily  . aspirin EC  81 mg Oral Daily  . carvedilol  6.25 mg Oral BID  . cinacalcet  120 mg Oral Q breakfast  . clonazePAM  1.5 mg Oral Daily  . heparin  5,000 Units Subcutaneous 3 times per day  . lanthanum  1,000 mg Oral TID WC  . pantoprazole  40 mg Oral Daily  .  piperacillin-tazobactam (ZOSYN)  IV  2.25 g Intravenous 3 times per day  . sodium chloride  3 mL Intravenous Q12H  . [START ON 12/20/2013] vancomycin  1,000 mg Intravenous Q M,W,F-HD  . vancomycin  1,000 mg Intravenous Once   . sodium chloride 10 mL/hr at 12/19/13 0129   sodium chloride, sodium chloride, acetaminophen, alteplase, feeding supplement (NEPRO CARB STEADY), heparin, heparin, lidocaine (PF), lidocaine-prilocaine, ondansetron (ZOFRAN) IV, pentafluoroprop-tetrafluoroeth, sevelamer carbonate

## 2013-12-19 NOTE — Progress Notes (Signed)
Family Medicine Teaching Service Attending Note  I interviewed and examined patient [name] and reviewed their tests and x-rays.  I discussed with Dr. Thomes Dinning and reviewed their note for today.  I agree with their assessment and plan.     Additionally  Sleepy during dialysis Feels overall better Await final cultures.  Likelihood of bacterial illness seems low.  Ok to discharge once stable from renal standpoint and with close follow up of cultures

## 2013-12-19 NOTE — ED Provider Notes (Signed)
Medical screening examination/treatment/procedure(s) were conducted as a shared visit with non-physician practitioner(s) and myself.  I personally evaluated the patient during the encounter. 43yo M, c/o fever/chills, N/V that began today. While at his usual HD PTA, pt's symptoms worsened and he was sent to the ED. Pt completed approx 2.5hours of his treatment. Pt given APAP at HD without change in fever. Denies CP, no abd pain. +febrile, tachycardic, BP stable. NAD, A&O, resps easy, abd soft/NT, neuro non-focal. CXR without infiltrate, flu panel pending. Admit for SIRS.       Alfonzo Feller, DO 12/19/13 [phone]

## 2013-12-19 NOTE — Discharge Summary (Signed)
[facility] Discharge Summary  Patient name: [name] Medical record number: [id number] Date of birth: Dec 22, 1970 Age: 43 y.o. Gender: male Date of Admission: 12/18/2013  Date of Discharge: 12/20/2013 Admitting Physician: Lind Covert, MD  Primary Care Provider: Howard Pouch, DO Consultants: Nephrology  Indication for Hospitalization: Fever, rigors, body aches  Discharge Diagnoses/Problem List:  - Viral-like illness - ESRD on hemodialysis - CHF - Anemia of chronic disease - Bipolar disorder  Disposition: Home  Discharge Condition: Improved  Discharge Exam:  General: Well appearing male, conversant, in NAD  Cardiovascular: Regular rate and rhythm, no murmurs/rubs/gallops  Respiratory: Clear to auscultation bilaterally in anterior lung fields  Abdomen: Abdomen soft, non tender, non distended, no rebound or guarding. Fullness in RLQ 2/2 kidney transplant. Well-healed scar in RLQ and over umbilicus.  Extremities: Warm and well perfused, no rashes. AV fistula in left forearm without erythema.  Neuro: alert and oriented, no focal deficits  Brief Hospital Course: The patient was admitted to the family medicine service on 12/18/13 after presenting to the ED with fever, chills, and body aches. The patient reports that earlier that morning he was in his usual state of health but felt progressively worse during dialysis prompting him to seek care. In the ED the patient was febrile to 102.5, tachypnic, and tachycardic. CXR showed no acute pulmonary findings. The patient developed nausea and vomiting relieved with zofran. Blood cultures were drawn and the patient was started on empiric van and zosyn. Flu returned negative. Overnight the patient had two episodes of non-bloody diarrhea. Pt denies any recent infectious exposures, skin breaks, or potential sources of infection. The patient was afebrile for the remainder of his admission.  Potassium was elevated to  5.7 on admission, increased to 6.9 on recheck. Nephrology was consulted and pt had additional dialysis session on 12/19/13 with improvement of K+ to 4.5.  Following dialysis the patient received vancomycin and ceftriaxone and potassium returned to normal. He reported he felt at his baseline and was requesting to return home, however given the patient's low blood pressures he remained hospitalized overnight. The following morning the patient felt well and his blood cultures remained negative. The patient was seen and examined on the day of discharge and was felt to be stable for discharge with outpatient follow up.  Issues for Follow Up: None  Significant Procedures: Hemodialysis  Significant Labs and Imaging:   Recent Labs Lab 12/18/13 1040  WBC 3.8*  HGB 14.1  HCT 43.7  PLT 102*    Recent Labs Lab 12/18/13 1040 12/19/13 0452 12/19/13 1000 12/20/13 0550  NA 143 138  --  135*  K 5.7* 6.9* 3.4* 4.5  CL 98 99  --  93*  CO2 24 21  --  25  GLUCOSE 90 103*  --  87  BUN 45* 66*  --  44*  CREATININE 12.40* 15.75*  --  12.58*  CALCIUM 10.9* 9.6  --  9.9  PHOS  --  5.0*  --  7.6*  ALKPHOS 254*  --   --   --   AST 17  --   --   --   ALT 23  --   --   --   ALBUMIN 4.3 3.2*  --  3.1*    Blood cultures - NGTD.  Results/Tests Pending at Time of Discharge: Blood cultures final read pending.   Discharge Medications:    Medication List         ARIPiprazole 5 MG tablet  Commonly known as:  ABILIFY  Take 5 mg by mouth daily.     aspirin EC 81 MG tablet  Take 81 mg by mouth daily.     carvedilol 6.25 MG tablet  Commonly known as:  COREG  Take 1 tablet (6.25 mg total) by mouth 2 (two) times daily.     cinacalcet 60 MG tablet  Commonly known as:  SENSIPAR  Take 120 mg by mouth daily.     clonazePAM 1 MG tablet  Commonly known as:  KLONOPIN  Take 1.5 mg by mouth daily. 1 and 1/2  tabs     lanthanum 1000 MG chewable tablet  Commonly known as:  FOSRENOL  Chew 1,000 mg by  mouth 3 (three) times daily with meals.     lidocaine 4 % cream  Commonly known as:  LMX  Apply 1 application topically daily as needed (when going to dialysis).     RENVELA 2.4 G Pack  Generic drug:  sevelamer carbonate  2.4 g 3 (three) times daily with meals as needed (take with snacks).     vardenafil 20 MG tablet  Commonly known as:  LEVITRA  Take 20 mg by mouth daily as needed for erectile dysfunction.     ZANTAC 150 MG capsule  Generic drug:  ranitidine  Take 150 mg by mouth every evening.        Discharge Instructions: Please refer to Patient Instructions section of EMR for full details.  Patient was counseled important signs and symptoms that should prompt return to medical care, changes in medications, dietary instructions, activity restrictions, and follow up appointments.   Follow-Up Appointments: Follow-up Information   Follow up with Howard Pouch, DO On 12/31/2013. (1:30 PM)    Specialty:  Family Medicine   Contact information:   [place] [address] [place], [place]  I have seen the patient and agree with the above note. Patient stable for discharge today with closely follow up. My physical exam is below:   General: Well appearing male, resting in bed, NAD.  Cardiovascular: RRR. No murmurs. Respiratory: CTAB. No rales, rhonchi, or wheezing.  Abdomen: soft, non tender, non distended, no rebound or guarding. Fullness in RLQ due to kidney transplant. Extremities: No LE edema. AV fistula in left forearm without erythema.  Neuro: No focal deficits.   D. Piloto de Gwendalyn Ege, MD Family Medicine PGY-3

## 2013-12-19 NOTE — Progress Notes (Signed)
Dr. Lacinda Axon requested pt's nephrologist to be notified of critical potassium level.  Dr. Moshe Cipro paged; orders received for dialysis.  Patient has no complaints of symptoms.  Will continue to assess.

## 2013-12-19 NOTE — Progress Notes (Signed)
CRITICAL VALUE ALERT  Critical value received:  Potassium 6.9 Date of notification:  12/19/2013  Time of notification:  0648  Critical value read back:yes  Nurse who received alert:  Arlyss Queen  MD notified (1st page):  Dr. Lacinda Axon  Time of first page:  [phone]  MD notified (2nd page):n/a  Time of second page:  Responding MD:  Dr. Lacinda Axon  Time MD responded:  [phone]  MD acknowledged critical lab value.  Stated he would put in orders.  Will notified nephrologist per MD request.  Will continue to assess patient.

## 2013-12-19 NOTE — Progress Notes (Signed)
Family Medicine Teaching Service Daily Progress Note Intern Pager: [phone]  Patient name: [name] Medical record number: [id number] Date of birth: 16-May-1971 Age: 43 y.o. Gender: male  Primary Care Provider: Howard Pouch, DO Consultants: Nephrology Code Status: Full  Pt Overview and Major Events to Date: Pt presented to ED following dialysis with fever, chills, rigors, tachycardia, and tachypnea. WBC 3.8. Pt meeting 4/4 SIRS criteria and admitted to family medicine service. No obvious source of infection. Blood cultures drawn and pt started on empiric vanc/zosyn. On 12/19/13 pt hyperkalemic to 6.9 and received dialysis. That morning he also reported nausea and 2 episodes of non-blood diarrhea.   Assessment and Plan:  Xxavier [name] is a 43 y.o. year old male presenting with acute onset of rigors, fevers, chills and body aches during dialysis. PMH is significant for ESRD s/p rejected renal transplant on HD MWF in [place]   # SIRS & ID: Febrile, Tachycardic, Tachypenia, low white count. No obvious source of infection, CXR clear, flu negative, blood cultures no growth x1 day. Lactate reassuring. Had 2 episodes of non-bloody diarrhea this morning, nothing since. Lipase elevated at 137 but does not meet criteria for acute pancreatitis - Vanco & Zosyn started for empiric coverage. Switch to Vanc and Ceftriaxone as suspicion for pseudomonas is low. Will have dose following dialysis. 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 S/p renal transplant failed, now on MWF HD at [facility] facility; waiting list for Transplant at [facility] (may have potential doner).  - Renal following, appreciate rec's - Graft does not appear infected  - Received dialysis 12/19/13 given hyperkalemia  # Anemia: Stable  - per nephrology   # Hyperkalemia:  - Pt dialyzed on 12/19/13 - Recheck potassium    # HTN, Hx of CHF, Pulmonary HTN:  On Coreg.  - Reduced Coreg dose given low BP's - Continue ASA   # PSYCH (BiPolar):  - continue home meds  FEN/GI: Renal Diet, PPI  Prophylaxis: Heparin   Disposition: Pt can likely be discharged today pending repeat labs. Pt will receive a repeat dose of antibiotics following dialysis. Pt is requesting to go home with the understanding that he may have to return to the hospital if his blood cultures become positive.  Subjective: Pt in dialysis, reports he feels overall improved today, denies any systemic symptoms. Does however note nausea overnight and 2 episodes of non-bloody diarrhea. Denies abdominal pain.   Objective: Temp:  [98.3 F (36.8 C)-102.5 F (39.2 C)] 98.9 F (37.2 C) (02/12 0500) Pulse Rate:  [100-131] 100 (02/12 0500) Resp:  [18-35] 20 (02/12 0500) BP: (104-156)/(64-99) 104/64 mmHg (02/12 0500) SpO2:  [95 %-98 %] 95 % (02/12 0500) Weight:  [233 lb 11 oz (106 kg)-237 lb 3.4 oz (107.6 kg)] 237 lb 3.4 oz (107.6 kg) (02/11 2205)  Physical Exam: General: Well appearing male lying in dialysis bed, conversant, in NAD Cardiovascular: Regular rate and rhythm, no murmurs/rubs/gallops  Respiratory: Clear to auscultation bilaterally in anterior lung fields Abdomen: Abdomen soft, non tender, non distended, no rebound or guarding. Fullness in RLQ 2/2 kidney transplant. Well-healed scar in RLQ and over umbilicus.  Extremities: Warm and well perfused, no rashes. AV fistula in left forearm without erythema. Being accessed for dialysis during exam Neuro: alert and oriented, no focal deficits  Laboratory:  Recent Labs Lab 12/18/13 1040  WBC 3.8*  HGB 14.1  HCT 43.7  PLT 102*    Recent Labs Lab 12/18/13 1040 12/19/13 0452  NA 143 138  K 5.7* 6.9*  CL 98 99  CO2 24 21  BUN 45* 66*  CREATININE 12.40* 15.75*  CALCIUM 10.9* 9.6  PROT 9.4*  --   BILITOT 0.6  --   ALKPHOS 254*  --   ALT 23  --   AST 17  --   GLUCOSE 90 103*    Blood cultures: NGx1  day  Imaging/Diagnostic Tests:  2 View CXR 12/18/2013  FINDINGS:  Significantly improved appearance of the cardiopericardial  silhouette. The heart silhouette is now within normal limits.  Pulmonary aeration is also improved. There is mild central airway  thickening and peribronchial cuffing which is similar compared to  prior. No focal airspace consolidation, pleural effusion or  pneumothorax. Surgical clips in the right upper quadrant suggest  prior cholecystectomy. No acute osseous abnormality.  IMPRESSION:  1. No active cardiopulmonary process.  2. Significant interval improvement in the previously identified  enlargement of the cardiopericardial silhouette. This likely  reflects resolution of pericardial effusion.  3. Mild residual central bronchitic changes and interstitial  prominence likely reflect chronic changes.   Britta Mccreedy, Med Student 12/19/2013, 9:15 AM MS4, Tower Intern pager: [phone], text pages welcome  Teaching Service Addendum. I have seen and evaluated this pt and agree with  assessment and plan as it is documented on this note.   My exam is as follows:  Gen:  No in acute distress. Cooperative with physical exam. HEENT: Moist mucous membranes. Oropharynx no erythema no exudates, no erythema.   CV: Regular rate and rhythm, no murmurs rubs or gallops. PULM: Clear to auscultation bilaterally. No wheezes/rales or rhonchi ABD: Soft, non tender, non distended, normal bowel sounds. Healed scar over umbilicus.  EXT: Well perfused, no edema. AV fistula in left forearm.  Neuro: Grossly intact. No neurologic focalization.   Disposition is pending on repeated potasium. Pt feels better and desires to go home. No other diarrheas and vitals more stable. Discussed with attending in rounds. Pt will get Vanc and Ceftriaxone after HD and can go home with instructions to return if BCx come back abnormal. otherwise close f/u is recommended.    D. Piloto Philippa Sicks, MD Family Medicine  PGY-3

## 2013-12-19 NOTE — Progress Notes (Signed)
ANTIBIOTIC CONSULT NOTE - Follow-up  Pharmacy Consult for vancomycin and Zosyn Indication: rule out sepsis  Allergies  Allergen Reactions  . Delacort [Hydrocortisone Base] Nausea Only  . Benicar [Olmesartan]     Unknown  . Food Itching    bananas  . Hydralazine     Head ache  . Nitrates, Organic Other (See Comments)    Palpitations, headache    Patient Measurements: Height: 6\' 3"  (190.5 cm) Weight: 237 lb 3.4 oz (107.6 kg) IBW/kg (Calculated) : 84.5 Adjusted Body Weight: 102kg  Vital Signs: Temp: 98.9 F (37.2 C) (02/12 0500) Temp src: Oral (02/12 0500) BP: 104/64 mmHg (02/12 0500) Pulse Rate: 100 (02/12 0500) Intake/Output from previous day: 02/11 0701 - 02/12 0700 In: 630.3 [P.O.:462; I.V.:168.3] Out: -  Intake/Output from this shift:    Labs:  Recent Labs  12/18/13 1040 12/19/13 0452  WBC 3.8*  --   HGB 14.1  --   PLT 102*  --   CREATININE 12.40* 15.75*   Estimated Creatinine Clearance: 8.3 ml/min (by C-G formula based on Cr of 15.75). No results found for this basename: VANCOTROUGH, VANCOPEAK, VANCORANDOM, GENTTROUGH, GENTPEAK, GENTRANDOM, TOBRATROUGH, TOBRAPEAK, TOBRARND, AMIKACINPEAK, AMIKACINTROU, AMIKACIN,  in the last 72 hours   Microbiology: No results found for this or any previous visit (from the past 720 hour(s)).  Assessment: 4 YOM with history of ESRD s/p rejected transplant on HD MWF who is seen in the ED with rigors, fevers/chills and body aches during HD on 2/11. He was initiated on vancomycin and zosyn. Potassium is elevated today so pt will be getting extra HD today. Cultures are pending and flu PCR is negative.   Vanc 2/11>> Zosyn 2/11>>  Goal of Therapy:  Pre-HD Vancomycin level 15-59mcg/mL  Plan:  1. Vancomycin 1gm post-HD today then 1gm post-HD on MWF 2. Continue zosyn 2.25gm IV Q8H 3. F/u renal plans, C&S, clinical status and pre-HD vanc level when appropriate  Salome Arnt, PharmD, BCPS Pager # [phone] 12/19/2013 8:21  AM

## 2013-12-19 NOTE — H&P (Signed)
Family Medicine Teaching Service Attending Note On 2-11 I interviewed and examined patient [name] and reviewed their tests and x-rays.  I discussed with Dr. Paulla Fore and reviewed their note for today.  I agree with their assessment and plan.     Additionally  Seen in ER Awake alert but tired appearing Lungs:  Normal respiratory effort, chest expands symmetrically. Lungs are clear to auscultation, no crackles or wheezes. Heart - Regular rate and rhythm.  No murmurs, gallops or rubs.    Neck:  No deformities, thyromegaly, masses, or tenderness noted.   Supple with full range of motion without pain. Mouth - no lesions, mucous membranes are moist, no decaying teeth  Skin:  Intact without suspicious lesions or rashes Abdomen: soft and non-tender without masses, organomegaly or hernias noted.  No guarding or rebound  Fever rigors without obvious source.  Agree with cover for flu and possible bacteremia given dialysis.  Await cultures

## 2013-12-19 NOTE — Procedures (Signed)
I was present at this dialysis session, have reviewed the session itself and made  appropriate changes  Kelly Splinter MD (pgr) [phone]    [phone] 12/19/2013, 10:20 AM

## 2013-12-20 LAB — CBC
HCT: 37.1 % — ABNORMAL LOW (ref 39.0–52.0)
Hemoglobin: 11.8 g/dL — ABNORMAL LOW (ref 13.0–17.0)
MCH: 31.9 pg (ref 26.0–34.0)
MCHC: 31.8 g/dL (ref 30.0–36.0)
MCV: 100.3 fL — ABNORMAL HIGH (ref 78.0–100.0)
PLATELETS: 77 10*3/uL — AB (ref 150–400)
RBC: 3.7 MIL/uL — AB (ref 4.22–5.81)
RDW: 14.4 % (ref 11.5–15.5)
WBC: 3.3 10*3/uL — ABNORMAL LOW (ref 4.0–10.5)

## 2013-12-20 LAB — PROCALCITONIN: PROCALCITONIN: 8.33 ng/mL

## 2013-12-20 LAB — RENAL FUNCTION PANEL
ALBUMIN: 3.1 g/dL — AB (ref 3.5–5.2)
BUN: 44 mg/dL — ABNORMAL HIGH (ref 6–23)
CHLORIDE: 93 meq/L — AB (ref 96–112)
CO2: 25 mEq/L (ref 19–32)
Calcium: 9.9 mg/dL (ref 8.4–10.5)
Creatinine, Ser: 12.58 mg/dL — ABNORMAL HIGH (ref 0.50–1.35)
GFR, EST AFRICAN AMERICAN: 5 mL/min — AB (ref >90)
GFR, EST NON AFRICAN AMERICAN: 4 mL/min — AB (ref >90)
Glucose, Bld: 87 mg/dL (ref 70–99)
POTASSIUM: 4.5 meq/L (ref 3.7–5.3)
Phosphorus: 7.6 mg/dL — ABNORMAL HIGH (ref 2.3–4.6)
SODIUM: 135 meq/L — AB (ref 137–147)

## 2013-12-20 MED ORDER — CARVEDILOL 6.25 MG PO TABS: 6.2500 mg | ORAL_TABLET | Freq: Two times a day (BID) | ORAL | Status: DC

## 2013-12-20 NOTE — Progress Notes (Signed)
Patient requests that Kaylyn Lim be allowed to receive medical updates about his condition.

## 2013-12-20 NOTE — Progress Notes (Signed)
Patient discharged.  Patient educated on discharge instructions, follow-up medications, and discharge education.  Patient educated on fevers, possible causes, and when to call a doctor.  Patient prescriptions sent to [facility].  Patient follow-up appointment scheduled.  Patient verbalized understanding.  IV removed.  Telemetry box 10 removed and CCMT notified.  Belongings gathered.  Patient escorted to car with RN.

## 2013-12-20 NOTE — Discharge Instructions (Signed)
Please be sure to follow up with your PCP. Be sure to call/or be seen if you develop fever again.   Be sure to attend your regularly scheduled dialysis tomorrow.    Fever, Adult A fever is a higher than normal body temperature. In an adult, an oral temperature around 98.6 F (37 C) is considered normal. A temperature of 100.4 F (38 C) or higher is generally considered a fever. Mild or moderate fevers generally have no long-term effects and often do not require treatment. Extreme fever (greater than or equal to 106 F or 41.1 C) can cause seizures. The sweating that may occur with repeated or prolonged fever may cause dehydration. Elderly people can develop confusion during a fever. A measured temperature can vary with:  Age.  Time of day.  Method of measurement (mouth, underarm, rectal, or ear). The fever is confirmed by taking a temperature with a thermometer. Temperatures can be taken different ways. Some methods are accurate and some are not.  An oral temperature is used most commonly. Electronic thermometers are fast and accurate.  An ear temperature will only be accurate if the thermometer is positioned as recommended by the manufacturer.  A rectal temperature is accurate and done for those adults who have a condition where an oral temperature cannot be taken.  An underarm (axillary) temperature is not accurate and not recommended. Fever is a symptom, not a disease.  CAUSES   Infections commonly cause fever.  Some noninfectious causes for fever include:  Some arthritis conditions.  Some thyroid or adrenal gland conditions.  Some immune system conditions.  Some types of cancer.  A medicine reaction.  High doses of certain street drugs such as methamphetamine.  Dehydration.  Exposure to high outside or room temperatures.  Occasionally, the source of a fever cannot be determined. This is sometimes called a "fever of unknown origin" (FUO).  Some situations may  lead to a temporary rise in body temperature that may go away on its own. Examples are:  Childbirth.  Surgery.  Intense exercise. HOME CARE INSTRUCTIONS   Take appropriate medicines for fever. Follow dosing instructions carefully. If you use acetaminophen to reduce the fever, be careful to avoid taking other medicines that also contain acetaminophen. Do not take aspirin for a fever if you are younger than age 32. There is an association with Reye's syndrome. Reye's syndrome is a rare but potentially deadly disease.  If an infection is present and antibiotics have been prescribed, take them as directed. Finish them even if you start to feel better.  Rest as needed.  Maintain an adequate fluid intake. To prevent dehydration during an illness with prolonged or recurrent fever, you may need to drink extra fluid.Drink enough fluids to keep your urine clear or pale yellow.  Sponging or bathing with room temperature water may help reduce body temperature. Do not use ice water or alcohol sponge baths.  Dress comfortably, but do not over-bundle. SEEK MEDICAL CARE IF:   You are unable to keep fluids down.  You develop vomiting or diarrhea.  You are not feeling at least partly better after 3 days.  You develop new symptoms or problems. SEEK IMMEDIATE MEDICAL CARE IF:   You have shortness of breath or trouble breathing.  You develop excessive weakness.  You are dizzy or you faint.  You are extremely thirsty or you are making little or no urine.  You develop new pain that was not there before (such as in the head, neck, chest,  back, or abdomen).  You have persistant vomiting and diarrhea for more than 1 to 2 days.  You develop a stiff neck or your eyes become sensitive to light.  You develop a skin rash.  You have a fever or persistent symptoms for more than 2 to 3 days.  You have a fever and your symptoms suddenly get worse. MAKE SURE YOU:   Understand these  instructions.  Will watch your condition.  Will get help right away if you are not doing well or get worse. Document Released: 04/19/2001 Document Revised: 01/16/2012 Document Reviewed: 08/25/2011 [facility] Patient Information 2014 [place], [place].

## 2013-12-24 ENCOUNTER — Ambulatory Visit: Payer: Medicare Other | Admitting: Cardiovascular Disease

## 2013-12-24 LAB — CULTURE, BLOOD (ROUTINE X 2)
Culture: NO GROWTH
Culture: NO GROWTH

## 2013-12-31 ENCOUNTER — Ambulatory Visit (INDEPENDENT_AMBULATORY_CARE_PROVIDER_SITE_OTHER): Payer: Medicare Other | Admitting: Family Medicine

## 2013-12-31 ENCOUNTER — Encounter: Payer: Self-pay | Admitting: Family Medicine

## 2013-12-31 VITALS — BP 162/105 | HR 89 | Temp 98.5°F | Ht 75.0 in | Wt 243.6 lb

## 2013-12-31 DIAGNOSIS — I10 Essential (primary) hypertension: Secondary | ICD-10-CM

## 2013-12-31 DIAGNOSIS — I509 Heart failure, unspecified: Secondary | ICD-10-CM

## 2013-12-31 DIAGNOSIS — R69 Illness, unspecified: Secondary | ICD-10-CM

## 2013-12-31 DIAGNOSIS — J111 Influenza due to unidentified influenza virus with other respiratory manifestations: Secondary | ICD-10-CM

## 2013-12-31 MED ORDER — AMLODIPINE BESYLATE 2.5 MG PO TABS: 2.5000 mg | ORAL_TABLET | Freq: Two times a day (BID) | ORAL | Status: DC

## 2013-12-31 NOTE — Patient Instructions (Signed)
Amlodipine back today. I have called in refills. Please keep your cardiology appointment tomorrow to discuss your blood pressure.  I am glad you are starting to feel better.

## 2013-12-31 NOTE — Assessment & Plan Note (Signed)
Patient with elevated blood pressure today of 162/105. Patient reports that he's been taken off all medications except for coreg 6.25 twice a day. Will restart amlodipine 5 mg daily in divided doses, as he was taking prior. Followup with cardiology is tomorrow. Red flags discussed.

## 2013-12-31 NOTE — Assessment & Plan Note (Addendum)
Patient declines iron/ferritin levels for CHF study today. Has cardiology appointment tomorrow

## 2013-12-31 NOTE — Assessment & Plan Note (Signed)
Hospital followup. Complete resolution of symptoms. Blood cultures negative x2 Red flags discussed the Followup as needed

## 2013-12-31 NOTE — Progress Notes (Signed)
   Subjective:    Patient ID: [name], male    DOB: 04/09/1971, 43 y.o.   MRN: [id number]  HPI  Hospital f/u: Pt reports complete resolution of illness.  No fevers. Appetite back to normal, took 2 weeks, experienced mild diarrhea.  Able to tolerate ESRD now. Last HD was yesterday and he states his BP was elevated about the same then as it is today. Denies CP, palpitations, headache or dizziness. Blood cultures negative.   Declined iron studies for CHF study.     Review of Systems Negative, with the exception of above mentioned in HPI     Objective:   Physical Exam BP 162/105  Pulse 89  Temp(Src) 98.5 F (36.9 C) (Oral)  Ht 6\' 3"  (1.905 m)  Wt 243 lb 9.6 oz (110.496 kg)  BMI 30.45 kg/m2 Gen: NAD. Argumentative today.  CV: RRR. Murmur appreciated.  Chest: CTAB, no wheeze or crackles Abd: Soft.  NTND. BS present. No Masses palpated.  Ext: No erythema. No edema.  Skin: No rashes, purpura or petechiae.

## 2014-01-01 ENCOUNTER — Ambulatory Visit: Payer: Medicare Other | Admitting: Cardiovascular Disease

## 2014-01-03 ENCOUNTER — Telehealth: Payer: Self-pay | Admitting: *Deleted

## 2014-01-03 ENCOUNTER — Telehealth: Payer: Self-pay | Admitting: Cardiovascular Disease

## 2014-01-03 NOTE — Telephone Encounter (Signed)
[name] from [facility] call needing office notes from last visit with Dr. Raoul Pitch.  Per [name] pt stated there was some adjustments on his medication and they need office notes stating that.  Please fax at [phone] or give her a call at [phone].  Derl Barrow, RN

## 2014-01-03 NOTE — Telephone Encounter (Signed)
Called requesting Rx for Viagra instead of Levitra.  States his insurance will pay for Viagra.  Advised would send to Dr.Nishan and Everett Graff to see what dose.  States to send Rx into [facility] on [address].

## 2014-01-03 NOTE — Telephone Encounter (Signed)
New message    Patient calling wants to know can he take Viagra . normally take levitra  20 mg due to insurance.

## 2014-01-03 NOTE — Telephone Encounter (Signed)
His primary has to prescribe that I did not prescribe it

## 2014-01-06 ENCOUNTER — Telehealth: Payer: Self-pay | Admitting: *Deleted

## 2014-01-06 NOTE — Telephone Encounter (Signed)
Patient with elevated blood pressure in the office  of 162/105. Patient reports that he's been taken off all medications except for coreg 6.25 twice a day.  Will restart amlodipine 5 mg daily in divided doses, as he was taking prior.  Followup with cardiology was suppose to be the following day, I do not see where he made that appointment.   Please fax this to [name] from [facility]. Or fax the last office note stating this.  Fax number: [phone]

## 2014-01-06 NOTE — Telephone Encounter (Signed)
Pt called into the office today to report his bp is running high prior to dialysis. This am it was 204/114. When he left dialysis it was 138/80. He does not take his meds on dialysis days but they have a standing order for clonidine 0.1 mg if he arrives with elevated bp. The pt is currently taking carvedilol 6.25 mg bid and his medical doctor recently restarted the amlodipine 2.5 mg bid. He does not have a way of checking his bp at home but reports he can tell it is high because he does not sleep well, he is tired and gets SOB with walking up stairs. He has some clonidine 0.2 mg at home aqnd wants to know if he can take this to help keep his bp under better control. Advised will forward for dr nishan's review. At present the pt was instructed to increase the carvedilol to 12.5 mg bid and then we will let him know what dr Johnsie Cancel thinks is the best thing to do. Pt agreed with this plan. Will forward for dr Johnsie Cancel review

## 2014-01-06 NOTE — Telephone Encounter (Signed)
Spoke to [name] she's getting a different story from patient regarding Dr Raoul Pitch visit or cardiologist that patient hasn't seen since 09/2013.Office note was faxed to [name] will keep me up to date. [name], [name]

## 2014-01-06 NOTE — Telephone Encounter (Signed)
Notified pt that Dr. Johnsie Cancel wants him to get Rx of Viagra from his PCP.  He understands and will call her.

## 2014-01-07 ENCOUNTER — Encounter: Payer: Self-pay | Admitting: Cardiovascular Disease

## 2014-01-07 NOTE — Telephone Encounter (Signed)
Those changes are fine  Renal should really tell him what to do with his meds As it relates to his dialysis and he has renovascular hypertension

## 2014-01-07 NOTE — Telephone Encounter (Signed)
Spoke with pt, aware of dr Johnsie Cancel recommendations. Follow scheduled.

## 2014-01-07 NOTE — Telephone Encounter (Signed)
Left message for pt to call.

## 2014-01-30 ENCOUNTER — Ambulatory Visit: Payer: Medicare Other | Admitting: Cardiovascular Disease

## 2014-02-19 ENCOUNTER — Encounter: Payer: Self-pay | Admitting: Cardiovascular Disease

## 2014-05-08 ENCOUNTER — Encounter: Payer: Self-pay | Admitting: Physician Assistant

## 2014-05-08 ENCOUNTER — Ambulatory Visit (INDEPENDENT_AMBULATORY_CARE_PROVIDER_SITE_OTHER): Payer: Medicare Other | Admitting: Physician Assistant

## 2014-05-08 VITALS — BP 141/91 | HR 64 | Ht 75.0 in | Wt 240.0 lb

## 2014-05-08 DIAGNOSIS — R5383 Other fatigue: Secondary | ICD-10-CM | POA: Diagnosis not present

## 2014-05-08 DIAGNOSIS — I5030 Unspecified diastolic (congestive) heart failure: Secondary | ICD-10-CM

## 2014-05-08 DIAGNOSIS — I428 Other cardiomyopathies: Secondary | ICD-10-CM | POA: Diagnosis not present

## 2014-05-08 DIAGNOSIS — R5381 Other malaise: Secondary | ICD-10-CM

## 2014-05-08 DIAGNOSIS — Z992 Dependence on renal dialysis: Secondary | ICD-10-CM

## 2014-05-08 DIAGNOSIS — I10 Essential (primary) hypertension: Secondary | ICD-10-CM | POA: Diagnosis not present

## 2014-05-08 DIAGNOSIS — N186 End stage renal disease: Secondary | ICD-10-CM | POA: Diagnosis not present

## 2014-05-08 DIAGNOSIS — I509 Heart failure, unspecified: Secondary | ICD-10-CM

## 2014-05-08 NOTE — Progress Notes (Signed)
Cardiology Office Note   Date:  05/08/2014   ID:  [name], DOB [birth year], MRN [id number]  PCP:  Howard Pouch, DO  Cardiologist:  Dr. [name]     History of Present Illness: [name] is a 43 y.o. 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  Prior EF 40-45%.  LHC 02/27/2008):  No CAD, EF 65%.  Echo 02-26-2013):  Mild inf HK, mod LVH, EF 50%, mild to mod LAE, mild RVE, PASP 39, mod pericardial effusion (effusion similar to Feb 27, 2011).  Lexiscan Myoview (06/2013):  EF 62%, normal study.    Patient underwent LHC 09/2013 in preparation for repeat renal transplant.  This demonstrated no significant CAD (mid to dist LAD 20-30%), EF 55-65%.  He was cleared to proceed with transplant at that point.  He was unable to pursue transplant because of antibodies.  He returns for follow up. Over the last couple weeks he notes significant fatigue. He's been able to meet his dry weight at dialysis. He denies any significant dyspnea, orthopnea, PND or edema. He denies chest pain or syncope. Blood pressures have been fairly stable. He had his hemoglobin checked at dialysis and tells me it was unchanged. He sometimes takes naps. He is not sure if he snores.  Recent Labs: 12/18/2013: ALT 23  12/20/2013: Creatinine 12.58*; Hemoglobin 11.8*; Potassium 4.5  No results found for requested labs within last 365 days.  Wt Readings from Last 3 Encounters:  05/08/14 240 lb (108.863 kg)  12/31/13 243 lb 9.6 oz (110.496 kg)  12/19/13 226 lb 3.1 oz (102.6 kg)     Past Medical History  Diagnosis Date  . Hypertension   . Anemia   . Depression   . Blood transfusion   . CHF (congestive heart failure)   . ESRD on hemodialysis 07/13/2011    Glomerulonephritis at age 35, started HD in 02/27/1995.  Deceased donor renal transplant February 26, 1998 at [facility], then kidney failed and went back on HD in 02/26/2005.  Is back on transplant list as of Feb 2015.  Gets HD MWF in [facility] with  [facility]. L forearm AVF   . Complication of anesthesia     Woke up intubated gets combative  afraid to be alone     Current Outpatient Prescriptions  Medication Sig Dispense Refill  . amLODipine (NORVASC) 2.5 MG tablet Take 1 tablet (2.5 mg total) by mouth 2 (two) times daily.  60 tablet  0  . ARIPiprazole (ABILIFY) 5 MG tablet Take 10 mg by mouth daily.       Marland Kitchen aspirin EC 81 MG tablet Take 81 mg by mouth daily.      . carvedilol (COREG) 25 MG tablet Take 25 mg by mouth 2 (two) times daily with a meal.      . cinacalcet (SENSIPAR) 60 MG tablet Take 120 mg by mouth daily.       . clonazePAM (KLONOPIN) 1 MG tablet Take 1.5 mg by mouth daily. 1 and 1/2  tabs      . lanthanum (FOSRENOL) 1000 MG chewable tablet Chew 1,000 mg by mouth 3 (three) times daily with meals.      . lidocaine (LMX) 4 % cream Apply 1 application topically daily as needed (when going to dialysis).      . ranitidine (ZANTAC) 150 MG capsule Take 150 mg by mouth every evening.        . sevelamer carbonate (RENVELA) 2.4  G PACK 2.4 g 3 (three) times daily with meals as needed (take with snacks).      . vardenafil (LEVITRA) 20 MG tablet Take 20 mg by mouth daily as needed for erectile dysfunction.       No current facility-administered medications for this visit.    Allergies:   Delacort; Benicar; Food; Hydralazine; and Nitrates, organic   Social History:  The patient  reports that he has never smoked. He has never used smokeless tobacco. He reports that he drinks about .5 ounces of alcohol per week. He reports that he does not use illicit drugs.   Family History:  The patient's family history includes Arthritis in his father and mother; Diabetes in his father and mother; Hypertension in his father and mother.   ROS:  Please see the history of present illness.  Denies any bleeding problems. Denies fever or cough.  All other systems reviewed and negative.   PHYSICAL EXAM: VS:  BP 141/91  Pulse 63  Ht 6\' 3"  (1.905 m)   Wt 240 lb (108.863 kg)  BMI 30.00 kg/m2 Well nourished, well developed, in no acute distress HEENT: normal Neck: no JVD Cardiac:  normal S1, S2; RRR; no murmur Lungs:  clear to auscultation bilaterally, no wheezing, rhonchi or rales Abd: soft, nontender, no hepatomegaly Ext: no edema Skin: warm and dry Neuro:  CNs 2-12 intact, no focal abnormalities noted  EKG:   NSR, HR 64, inf-lat TWI, similar to prior tracing   ASSESSMENT AND PLAN:  1. Fatigue: Etiology not entirely clear. Ejection fraction has been normal by recent assessments. He has no evidence of difficult to control volume. He is not certain if he snores. He certainly has body habitus for sleep apnea. He tells me that his recent hemoglobin was normal. I will arrange TSH and LFTs. I will arrange sleep study to rule out sleep apnea. Recent cardiac catheterization demonstrated no significant CAD. He recently stopped his Abilify. I have asked him to followup with psychiatry for further management of his depression. 2. NICM:  EF has recovered.  No significant CAD on LHC in 09/2013.  Med Rx Has previously been limited by hypotension at dialysis.   Blood pressures better now.  Continue beta blocker. Resume ACE inhibitor with lisinopril 2.5 mg daily.  If fatigue worsens, continued repeat echocardiogram. 3. HFpEF:  Stable. Volume managed by dialysis. 4. Hypertension:   Fair control. 5. ESRD:   He is on dialysis MWF.   6. Disposition:  F/u with Dr. [name] in 3 months.   Signed, Versie Starks, MHS 05/08/2014 2:39 PM    [facility] [place], [place], [place]  [address] Phone: [phone]; Fax: [phone]

## 2014-05-08 NOTE — Patient Instructions (Signed)
START LISINOPRIL 2.5 MG 1 TABLET DAILY; RX SENT IN  YOU HAVE BEEN GIVEN AN RX FOR LAB WORK TO BE DONE AT DIALYSIS TOMORROW WITH THE RESULTS TO BE FAXED TO [place], [place]  Your physician has recommended that you have a sleep study. This test records several body functions during sleep, including: brain activity, eye movement, oxygen and carbon dioxide blood levels, heart rate and rhythm, breathing rate and rhythm, the flow of air through your mouth and nose, snoring, body muscle movements, and chest and belly movement.  Your physician wants you to follow-up in: 3 MONTHS WITH DR. Blima Singer will receive a reminder letter in the mail two months in advance. If you don't receive a letter, please call our office to schedule the follow-up appointment.

## 2014-05-19 ENCOUNTER — Encounter: Payer: Self-pay | Admitting: Family Medicine

## 2014-05-19 ENCOUNTER — Encounter: Payer: BC Managed Care – PPO | Admitting: Family Medicine

## 2014-05-25 ENCOUNTER — Encounter: Payer: Self-pay | Admitting: Family Medicine

## 2014-05-25 NOTE — Progress Notes (Signed)
Patient ID: [name], male   DOB: 08/21/1971, 43 y.o.   MRN: [id number] Pt rescheduled.

## 2014-07-18 ENCOUNTER — Ambulatory Visit (HOSPITAL_BASED_OUTPATIENT_CLINIC_OR_DEPARTMENT_OTHER): Payer: Medicare Other | Attending: Physician Assistant

## 2014-08-15 ENCOUNTER — Telehealth: Payer: Self-pay | Admitting: Family Medicine

## 2014-08-15 NOTE — Telephone Encounter (Signed)
Pt is in between psychiatrists, previous doctor left and pt is not scheduled with his new one until the end of the month, pt will be out of his adderall and wants to know if MD here will give him enough to last until his appt.

## 2014-08-15 NOTE — Telephone Encounter (Signed)
Patient should be encouraged to call his new psych for this prescription. If he has an appointment with them and he was prescribed this prior, they should give him a dose to last to his appointment. Thanks.

## 2014-08-22 ENCOUNTER — Other Ambulatory Visit: Payer: Self-pay

## 2014-09-10 ENCOUNTER — Ambulatory Visit (INDEPENDENT_AMBULATORY_CARE_PROVIDER_SITE_OTHER): Payer: Medicare Other | Admitting: Family Medicine

## 2014-09-10 ENCOUNTER — Encounter: Payer: Self-pay | Admitting: Family Medicine

## 2014-09-10 VITALS — BP 146/86 | HR 73 | Ht 75.0 in | Wt 229.0 lb

## 2014-09-10 DIAGNOSIS — Z0000 Encounter for general adult medical examination without abnormal findings: Secondary | ICD-10-CM

## 2014-09-10 DIAGNOSIS — F3181 Bipolar II disorder: Secondary | ICD-10-CM

## 2014-09-10 DIAGNOSIS — Z992 Dependence on renal dialysis: Secondary | ICD-10-CM

## 2014-09-10 DIAGNOSIS — N186 End stage renal disease: Secondary | ICD-10-CM

## 2014-09-10 NOTE — Assessment & Plan Note (Signed)
Patient appears to be doing well on dialysis now every Tuesday, Thursday and Sunday at night through [facility]. I have asked patient to have them fax his visits to [place]. Follow-up as needed

## 2014-09-10 NOTE — Assessment & Plan Note (Addendum)
Patient states he received his flu shot in October through dialysis center He refused a PSA drawn today, I have written a prescription for him to have this drawn on his next dialysis, along with lipid profile. Letter stating patient's current and past medical history, along with a description and exam today given for her employer. Fax number and contact information was given on the letter for her employer to fax over a specific form if more information is required. Patient was shown letter and approved medical content.

## 2014-09-10 NOTE — Patient Instructions (Signed)
Please have your PSA and lipid profile drawn on your next dialysis blood draw. I have included a letter for your employment, I have given them my fax number if they have any further questions or would like me to fill out a specific form they can fax it to me.

## 2014-09-10 NOTE — Progress Notes (Signed)
Subjective:    Patient ID: [name], male    DOB: 01-08-1971, 43 y.o.   MRN: [id number]  HPI [name] is a 43 y.o. male presents for routine physical at family medicine clinic.  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 Patient did not bring in a form for me to complete today. Employment states that a physical be sufficient. Discussed with patient the amount of information he is comfortable with this employment saying. We have decided to put in a letter format his current problem list, past medical history and any findings of physical that was sent him from performing his new job.  ESRD: Patient states he now does dialysis at nighttime on Sunday Tuesday and Thursday. He has his dialysis completed through [facility] on [address]. No records are being forwarded from [facility]. Patient states he is doing well and has having no difficulties with his dialysis. His labs are drawn there before dialysis. He does not make urine.  Bipolar: Patient has history of bipolar, and his medications have recently been changed from Abilify to Zyprexa. He is still taking Klonopin. He is still following up through [facility], but states that he is always seeing a new provider there. This is discouraging, and he is looking for a private psychiatrist.  CHF: Patient has followed up with cardiology this past July.patient has a hx of NICM, ESRD on hemodialysis, s/p renal transplant (failed after 7 years), HTN, CHF (HFpEF). Prior EF 40-45%. LHC 02/11/2008): No CAD, EF 65%. Echo 02-10-13): Mild inf HK, mod LVH, EF 50%, mild to mod LAE, mild RVE, PASP 39, mod pericardial effusion (effusion similar to February 11, 2011). Lexiscan Myoview (06/2013): EF 62%, normal study. Patient underwent LHC 09/2013 in preparation for repeat renal transplant. This demonstrated no significant CAD (mid to dist LAD 20-30%), EF 55-65%. He  was cleared to proceed with transplant at that point. 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  nonsmoker  Past Medical History  Diagnosis Date  . Hypertension   . Anemia   . Depression   . Blood transfusion   . CHF (congestive heart failure)   . ESRD on hemodialysis 07/13/2011    Glomerulonephritis at age 11, started HD in 1995/02/11.  Deceased donor renal transplant 1998-02-10 at [facility], then kidney failed and went back on HD in 02/10/2005.  Is back on transplant list as of Feb 2015.  Gets HD MWF in [facility] with [facility]. L forearm AVF   . Complication of anesthesia     Woke up intubated gets combative  afraid to be alone     Review of Systems Per Hpi    Objective:   Physical Exam BP 146/86 mmHg  Pulse 73  Ht 6\' 3"  (1.905 m)  Wt 229 lb (103.874 kg)  BMI 28.62 kg/m2 Gen: pleasant African-American male, no acute distress, well-developed, well-nourished, nontoxic in appearance HEENT: AT. [place]. Bilateral TM visualized and normal in appearance. Bilateral eyes without injections or icterus. MMM. Bilateral nares with mild erythema, no swelling. Throat without erythema or exudates.  CV: RRR, 1/6 systolic murmur appreciated Chest: CTAB, no wheeze or crackles Abd: Soft. round. Renal transplant scar right abdomen. NTND. BS present. no Masses palpated (other than renal transplant right lower abdomen) Ext: No erythema. No edema. Was 2/4 DP/PT Skin: no rashes, purpura or petechiae.  Neuro:  Normal gait. PERLA. EOMi.  Alert. Cranial Nerves II through XII intact MSK: 5 out of 5 bilateral upper and lower extremity strength, normal DTRs Psych: normal dress, affect, demeanor, mood and speech.     Assessment & Plan:

## 2014-09-10 NOTE — Assessment & Plan Note (Signed)
Patient has changed from Abilify to Zyprexa. Have written a prescription to have a lipid profile drawn with his next dialysis.

## 2014-10-16 ENCOUNTER — Encounter (HOSPITAL_COMMUNITY): Payer: Self-pay | Admitting: Cardiovascular Disease

## 2014-10-17 HISTORY — PX: KIDNEY TRANSPLANT: SHX239

## 2014-11-24 ENCOUNTER — Ambulatory Visit (INDEPENDENT_AMBULATORY_CARE_PROVIDER_SITE_OTHER): Payer: Medicare Other | Admitting: Family Medicine

## 2014-11-24 ENCOUNTER — Encounter: Payer: Self-pay | Admitting: Family Medicine

## 2014-11-24 VITALS — BP 149/92 | HR 80 | Temp 98.6°F | Ht 75.0 in | Wt 216.6 lb

## 2014-11-24 DIAGNOSIS — I151 Hypertension secondary to other renal disorders: Secondary | ICD-10-CM

## 2014-11-24 DIAGNOSIS — Z940 Kidney transplant status: Secondary | ICD-10-CM

## 2014-11-24 DIAGNOSIS — N2889 Other specified disorders of kidney and ureter: Secondary | ICD-10-CM

## 2014-11-24 DIAGNOSIS — N433 Hydrocele, unspecified: Secondary | ICD-10-CM

## 2014-11-24 NOTE — Patient Instructions (Signed)
It was a pleasure seeing you again [name]. I very happy for you that you following that your kidney. Continue to take her Augmentin as prescribed. If you have any questions for me, for free to call in or make an appointment.

## 2014-11-25 ENCOUNTER — Encounter: Payer: Self-pay | Admitting: Family Medicine

## 2014-11-25 DIAGNOSIS — Z940 Kidney transplant status: Secondary | ICD-10-CM

## 2014-11-25 DIAGNOSIS — N433 Hydrocele, unspecified: Secondary | ICD-10-CM

## 2014-11-25 HISTORY — DX: Hydrocele, unspecified: N43.3

## 2014-11-25 HISTORY — DX: Kidney transplant status: Z94.0

## 2014-11-25 NOTE — Assessment & Plan Note (Signed)
Doing well after transplant.  Follows with [facility] Small superficial open of abdominal scar. Did not appear infected, no drainage. Mild erythema.  Pt has appointment with transplant team tomorrow. Pt on Augmentin for URI/sinusitis. ADvised pt to keep area clean and dry. Apply antibiotic ointment to area and be certain to inform surgeon of area.

## 2014-11-25 NOTE — Assessment & Plan Note (Signed)
Mildly elevated today. Pt states they are working with his medications through nephrology.

## 2014-11-25 NOTE — Assessment & Plan Note (Signed)
Pt has seen urology in the past and he is awaiting an appointment/call back from them concerning current issue. - likely pt is experiencing enlargement of hydrocele from post surgical inflammation from his kidney  transplant surgery.  - Exam today was negative for red flags (pain, masses, hernia, torsion, skin breakdown etc).  - Advised patient to continue to follow up with urology and provided AVS education on hydrocele.

## 2014-11-25 NOTE — Progress Notes (Signed)
Subjective:    Patient ID: [name], male    DOB: [birth year], 44 y.o.   MRN: [id number]  HPI  S/p left renal transplant: pt states he is doing well since his left kidney transplant on Dec. 11 2015. Pt has had glomerulonephritis since 44 years of age. He underwent right kidney transplant in 02/05/1995. He is following up regularly with transplant clinic and nephrology. He brings in his medications changes with him today and list has been updated. He reports his wounds are healing well, no drainage or pain. He denies any fevers or abdominal pain.   Hydrocele, right: pt complains of right hydrocele return since his surgery. He denies any fevers, erythema, skin breakdown, pain or palpable masses. He states he is waiting on his urologist to call him back to make an appointment.   Hypertension: Pt states they have been adjusting his medication through nephrology. He denies any chest pain, shortness of breath, leg edema, palpitations, vision changes, headaches or dizziness.   Nonsmoker Past Medical History  Diagnosis Date  . Hypertension   . Anemia   . Depression   . Blood transfusion   . CHF (congestive heart failure)   . ESRD on hemodialysis 07/13/2011    Glomerulonephritis at age 35, started HD in 1995-02-05.  Deceased donor renal transplant February 04, 1998 at [facility], then kidney failed and went back on HD in 2005/02/04.  Is back on transplant list as of Feb 2015.  Gets HD MWF in [facility] with [facility]. L forearm AVF   . Complication of anesthesia     Woke up intubated gets combative  afraid to be alone    Allergies  Allergen Reactions  . Delacort [Hydrocortisone Base] Nausea Only  . Benicar [Olmesartan]     Unknown  . Food Itching    bananas  . Hydralazine     Head ache  . Nitrates, Organic Other (See Comments)    Palpitations, headache     Medication List       This list is accurate as of: 11/24/14 11:59 PM.  Always use your most recent med list.               amLODipine 10 MG tablet  Commonly  known as:  NORVASC  Take 10 mg by mouth 2 (two) times daily before a meal.     aspirin EC 81 MG tablet  Take 81 mg by mouth daily.     carvedilol 25 MG tablet  Commonly known as:  COREG  Take 25 mg by mouth 2 (two) times daily with a meal.     clonazePAM 1 MG tablet  Commonly known as:  KLONOPIN  Take 1.5 mg by mouth daily. 1 and 1/2  tabs     cloNIDine 0.3 MG tablet  Commonly known as:  CATAPRES  Take 0.3 mg by mouth 2 (two) times daily.     furosemide 80 MG tablet  Commonly known as:  LASIX  Take 80 mg by mouth 2 (two) times daily.     HYDROcodone-acetaminophen 5-325 MG per tablet  Commonly known as:  NORCO/VICODIN  Take 1-2 tablets by mouth every 6 (six) hours as needed for moderate pain (s/p renal tranplant pain).     lidocaine 4 % cream  Commonly known as:  LMX  Apply 1 application topically daily as needed (when going to dialysis).     mycophenolate 180 MG EC tablet  Commonly known as:  MYFORTIC  Take 720 mg by mouth 2 (two)  times daily.     OLANZapine 20 MG tablet  Commonly known as:  ZYPREXA  Take 20 mg by mouth at bedtime.     pantoprazole 40 MG tablet  Commonly known as:  PROTONIX  Take 40 mg by mouth daily.     predniSONE 5 MG Tabs tablet  Commonly known as:  STERAPRED UNI-PAK  Take 20 mg by mouth daily.     sodium bicarbonate 650 MG tablet  Take 1,300 mg by mouth 2 (two) times daily.     sulfamethoxazole-trimethoprim 400-80 MG per tablet  Commonly known as:  BACTRIM,SEPTRA  Take 1 tablet by mouth 3 (three) times a week.     tacrolimus 5 MG capsule  Commonly known as:  PROGRAF  Take 6 mg by mouth 2 (two) times daily.     valGANciclovir 450 MG tablet  Commonly known as:  VALCYTE  Take by mouth daily.     vardenafil 20 MG tablet  Commonly known as:  LEVITRA  Take 20 mg by mouth daily as needed for erectile dysfunction.        Review of Systems Per hPI    Objective:   Physical Exam BP 149/92 mmHg  Pulse 80  Temp(Src) 98.6 F (37 C)  (Oral)  Ht 6\' 3"  (1.905 m)  Wt 216 lb 9.6 oz (98.249 kg)  BMI 27.07 kg/m2 Gen: AAM. NAD, nontoxic in appearance. Well developed, well nourished. Alert, oriented x3.  HEENT: AT. [place]. Bilateral eyes without injections or icterus. MMM.  CV: RRR  Chest: CTAB, no wheeze or crackles Abd: Soft. round. NTND. BS present. Bilateral kidney transplant palpated without tenderness. Mild bruising right abd. Well healing transplant scar, with small 14mm skin breakdown midscar. No drainage, no erythema, no signs of infection.  Ext: No erythema. No edema. Equal pulses bilaterally.   GU: Moderate swelling right testicle, no erythema, tenderness or masses. No hernia. No torsion. No skin changes.       Assessment & Plan:

## 2014-12-10 IMAGING — CR DG CHEST 1V PORT
1 series · 1 of 1 positions shown · non-contrast
Comparison: 01/05/2013

CLINICAL DATA: Acute respiratory distress.  Shortness of breath,
vomiting.

PORTABLE CHEST - 1 VIEW

[AP]
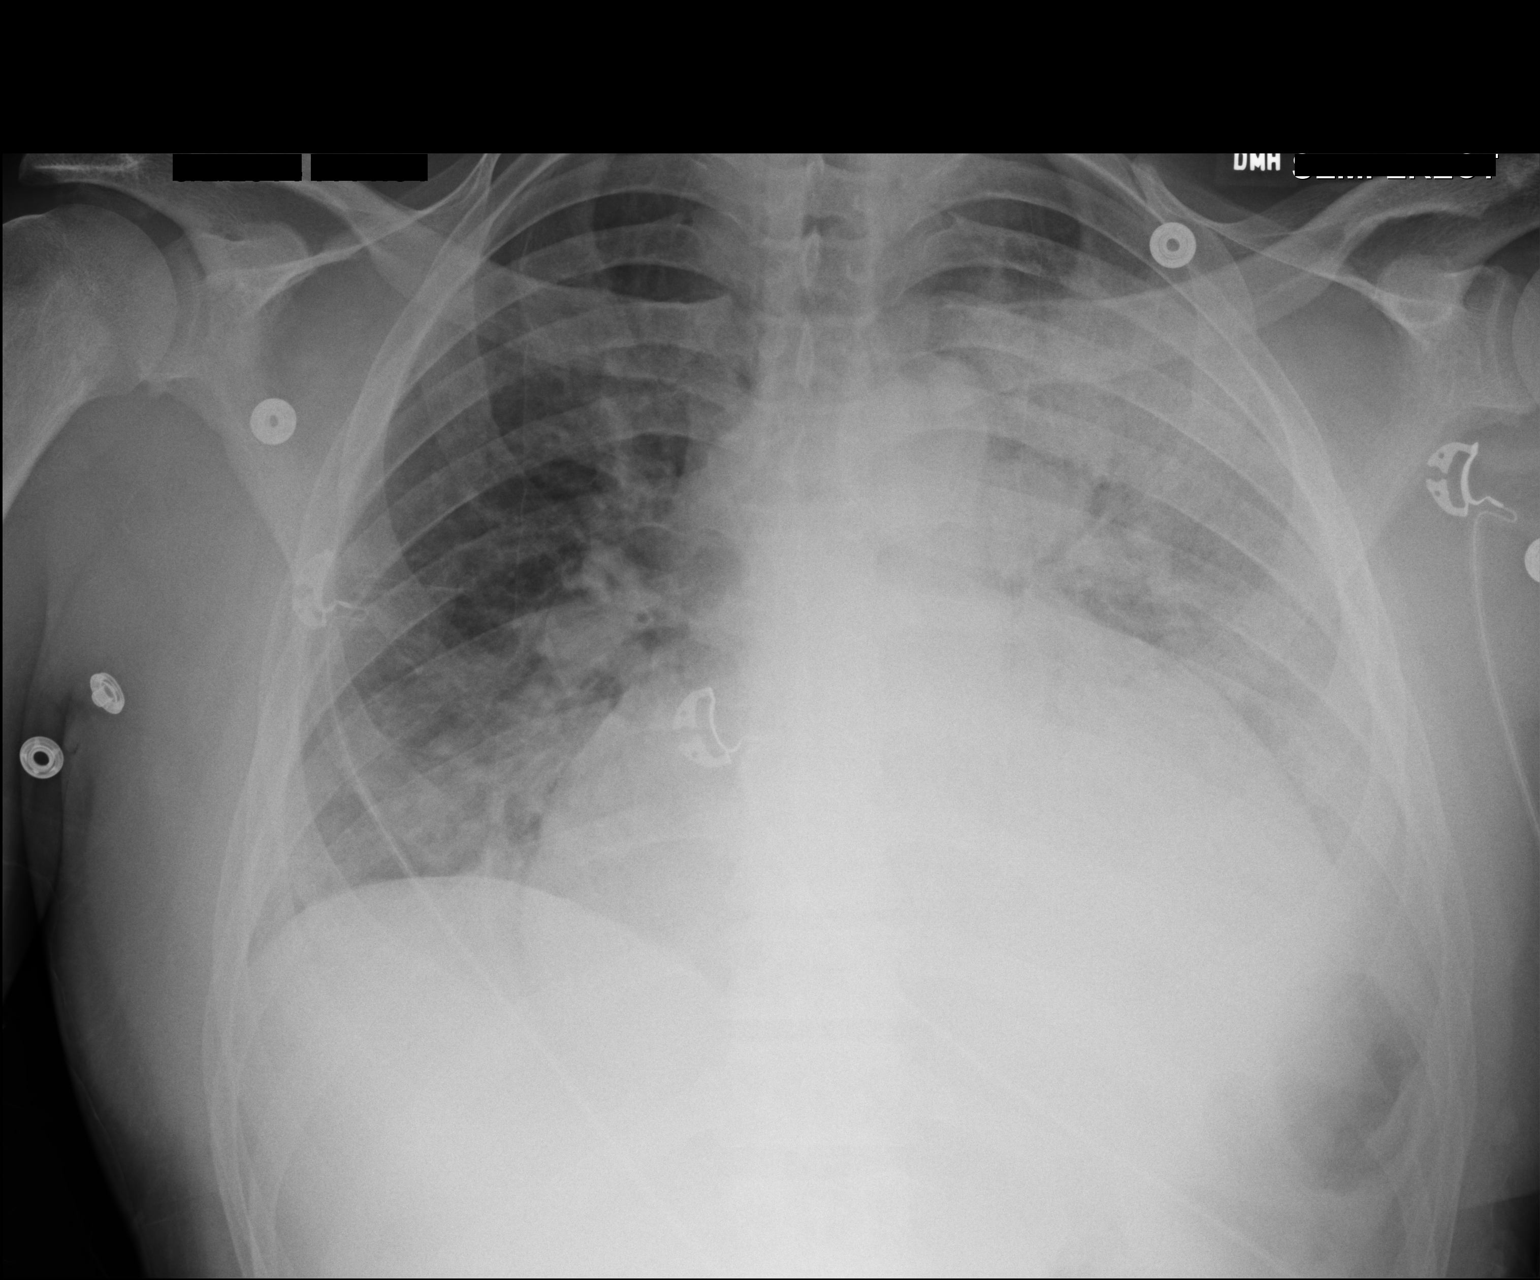

[1 of 1 positions shown; findings below may reference images not displayed]

FINDINGS: Marked enlargement of the cardiopericardial silhouette.
Given the configuration, I cannot exclude pericardial effusion.
Bilateral airspace disease, left greater than right, likely
multifocal pneumonia.  There is some improvement in the lower lobes
bilaterally.  No definite effusions.
IMPRESSION: Continued bilateral airspace disease, left greater than right with
some improvement in the lower lobes.

Marked enlargement of the cardiopericardial silhouette.  Given the
appearance, cannot exclude pericardial effusion.

## 2015-02-02 ENCOUNTER — Inpatient Hospital Stay: Payer: Medicare Other | Admitting: Family Medicine

## 2015-02-05 ENCOUNTER — Ambulatory Visit (INDEPENDENT_AMBULATORY_CARE_PROVIDER_SITE_OTHER): Payer: Medicare Other | Admitting: Family Medicine

## 2015-02-05 VITALS — BP 120/80 | HR 89 | Temp 98.2°F | Ht 75.0 in | Wt 205.2 lb

## 2015-02-05 DIAGNOSIS — N390 Urinary tract infection, site not specified: Secondary | ICD-10-CM | POA: Diagnosis not present

## 2015-02-05 DIAGNOSIS — Z940 Kidney transplant status: Secondary | ICD-10-CM | POA: Diagnosis not present

## 2015-02-05 DIAGNOSIS — N433 Hydrocele, unspecified: Secondary | ICD-10-CM

## 2015-02-05 MED ORDER — HYDROCODONE-ACETAMINOPHEN 5-325 MG PO TABS: 1.0000 | ORAL_TABLET | Freq: Four times a day (QID) | ORAL | Status: DC | PRN

## 2015-02-05 NOTE — Progress Notes (Addendum)
Subjective:   This chart was scribed for Mark Forts, MD by Erling Conte, ED Scribe. This patient was seen in [place] the patient's care was started at 10:01 PM.   Patient ID: [name], male    DOB: [birth year], 44 y.o.   MRN: [id number]  02/05/2015  Urinary Tract Infection   HPI  HPI Comments: [name] is a 44 y.o. male who presents to the [facility] with a urinary tract infection. Pt was seen today at [facility] and saw a PA and was diagnosed with UTI. He was prescribed amoxicillin 3x a day. He is s/p renal transplant that performed at [facility] four months ago. He reports this is his second transplant due to his glomerulonephritis; first renal transplant occurred in his twenties. Pt states he began having dysuria around 5 AM this morning and associated low back pain. Pt notes he has some hematuria but was told there may be some blood in his urine due to a biopsy of his transplant he had four days prior. He states he has a h/o UTIs in the past; he is usually treated with Amoxicillin with good results. Pt states his creatinine levels Tuesday were 2.24. Pt states he has taken Norco in the past for the pain. He denies any urinary frequency, fever, chills, nausea, or vomiting.  +penile discharge at urethra meatus this morning.  +same male sexual partner for several years; always uses condoms.  Has R sided scrotal swelling with pain with dysuria today.  Pt suffered with R scrotal swelling after renal transplant four months ago and was told it was normal in post-operative period.  No genital rash.  No trauma to genital area; no aggressive exercise in past week.  Has undergone urology evaluation in past for R hydrocele per PCP note from 11/2014.    Upon review of chart from [facility] today, moderate blood with 100 protein and large LE and negative nitrites.  Microscopic exam revealed TNTC WBC, 16-30 RTC, few bacteria.   Review of Systems    Constitutional: Negative for fever, chills, diaphoresis and fatigue.  Gastrointestinal: Negative for nausea, vomiting and abdominal pain.  Genitourinary: Positive for dysuria, hematuria, flank pain, discharge, scrotal swelling and testicular pain. Negative for urgency, frequency, decreased urine volume, penile swelling and penile pain.  Musculoskeletal: Positive for back pain.  Skin: Negative for rash.    Past Medical History  Diagnosis Date  . Hypertension   . Anemia   . Depression   . Blood transfusion   . CHF (congestive heart failure)   . Complication of anesthesia     Woke up intubated gets combative  afraid to be alone   . Anxiety   . Arthritis   . Blood transfusion without reported diagnosis   . ESRD on hemodialysis 07/13/2011    Glomerulonephritis at age 68, started HD in February 05, 1995.  Deceased donor renal transplant 1998/02/04 at [facility], then kidney failed and went back on HD in 02-04-05.  L forearm AVF . s/p repeat renal transplant 10/2014 [facility].   Past Surgical History  Procedure Laterality Date  . Cholecystectomy    . Angioplasty    . Dialysis fistula creation    . Knee surgery    . Mouth surgery    . Nephrectomy transplanted organ    . Left heart catheterization with coronary angiogram N/A 10/01/2013    Procedure: LEFT HEART CATHETERIZATION WITH CORONARY ANGIOGRAM;  Surgeon: [name], MD;  Location: [facility] CATH LAB;  Service: Cardiovascular;  Laterality: N/A;  . Kidney transplant  10/17/14   Allergies  Allergen Reactions  . Delacort [Hydrocortisone Base] Nausea Only  . Benicar [Olmesartan]     Unknown  . Food Itching    bananas  . Hydralazine     Head ache  . Nitrates, Organic Other (See Comments)    Palpitations, headache   Current Outpatient Prescriptions  Medication Sig Dispense Refill  . amLODipine (NORVASC) 10 MG tablet Take 10 mg by mouth 2 (two) times daily before a meal.    . amoxicillin (AMOXIL) 400 MG/5ML suspension Take 500 mg by mouth.    Marland Kitchen aspirin EC 81 MG  tablet Take 81 mg by mouth daily.    . carvedilol (COREG) 25 MG tablet Take 25 mg by mouth 2 (two) times daily with a meal.    . clonazePAM (KLONOPIN) 1 MG tablet Take 1.5 mg by mouth daily. 1 and 1/2  tabs    . cloNIDine (CATAPRES) 0.3 MG tablet Take 0.3 mg by mouth 2 (two) times daily.    . furosemide (LASIX) 80 MG tablet Take 80 mg by mouth 2 (two) times daily.    Marland Kitchen lidocaine (LMX) 4 % cream Apply 1 application topically daily as needed (when going to dialysis).    . mycophenolate (MYFORTIC) 180 MG EC tablet Take 720 mg by mouth 2 (two) times daily.    Marland Kitchen OLANZapine (ZYPREXA) 20 MG tablet Take 20 mg by mouth at bedtime.    . pantoprazole (PROTONIX) 40 MG tablet Take 40 mg by mouth daily.    . predniSONE (STERAPRED UNI-PAK) 5 MG TABS tablet Take 10 mg by mouth daily.     . sodium bicarbonate 650 MG tablet Take 1,300 mg by mouth 2 (two) times daily.    Marland Kitchen sulfamethoxazole-trimethoprim (BACTRIM,SEPTRA) 400-80 MG per tablet Take 1 tablet by mouth 3 (three) times a week.    . tacrolimus (PROGRAF) 5 MG capsule Take 6 mg by mouth 2 (two) times daily.    . valGANciclovir (VALCYTE) 450 MG tablet Take by mouth daily.    . vardenafil (LEVITRA) 20 MG tablet Take 20 mg by mouth daily as needed for erectile dysfunction.    Marland Kitchen HYDROcodone-acetaminophen (NORCO/VICODIN) 5-325 MG per tablet Take 1 tablet by mouth every 6 (six) hours as needed for moderate pain. 30 tablet 0   No current facility-administered medications for this visit.       Objective:    Triage Vitals: BP 120/80 mmHg  Pulse 89  Temp(Src) 98.2 F (36.8 C) (Oral)  Ht 6\' 3"  (1.905 m)  Wt 205 lb 4 oz (93.101 kg)  BMI 25.65 kg/m2  SpO2 100%  Physical Exam  Constitutional: He is oriented to person, place, and time. He appears well-developed and well-nourished. No distress.  HENT:  Head: Normocephalic and atraumatic.  Eyes: Conjunctivae and EOM are normal.  Neck: Neck supple. No tracheal deviation present.  Cardiovascular: Normal rate  and regular rhythm.   Pulmonary/Chest: Effort normal and breath sounds normal. No respiratory distress. He has no wheezes. He has no rales.  Abdominal: Soft. Bowel sounds are normal. He exhibits no distension and no mass. There is no tenderness. There is no rebound and no guarding. Hernia confirmed negative in the right inguinal area and confirmed negative in the left inguinal area.  Genitourinary: Penis normal. Right testis shows swelling and tenderness. Right testis shows no mass. Right testis is descended. Left testis shows no mass, no swelling and no tenderness. No penile erythema  or penile tenderness. No discharge found.  Musculoskeletal: Normal range of motion.  Lymphadenopathy:       Right: No inguinal adenopathy present.       Left: No inguinal adenopathy present.  Neurological: He is alert and oriented to person, place, and time.  Skin: Skin is warm and dry.  Psychiatric: He has a normal mood and affect. His behavior is normal.  Nursing note and vitals reviewed.      Assessment & Plan:   1. UTI (lower urinary tract infection)   2. Hydrocele, right   3. Renal transplant, status post     1. UTI: New.  Previously evaluated today at [facility]; rx for Amoxicillin provided. Pt reports UTIs usually respond well to Amoxicillin.  Rx for hydrocodone provided.  Pt denies previous use of pyridium for UTI symptoms.  Consider STD screening with GC/Chlam if no improvement; patient provided clean cath only at visit; thus, deferred GC/Chlam testing today. 2.  R hydrocele: recurrent in past 24 hours; no history to suggest torsion.  Associated with dysuria.  Recent hydrocele after renal transplant.  Recommend contacting transplant team tomorrow regarding UTI symptoms and R scrotal swelling.  RTC for development of moderate to severe pain. 3.  Renal transplant four months ago:  Stable; recent Creatinine this week of 2.24.      Meds ordered this encounter  Medications  .  HYDROcodone-acetaminophen (NORCO/VICODIN) 5-325 MG per tablet    Sig: Take 1 tablet by mouth every 6 (six) hours as needed for moderate pain.    Dispense:  30 tablet    Refill:  0  . amoxicillin (AMOXIL) 400 MG/5ML suspension    Sig: Take 500 mg by mouth.    No Follow-up on file.  I personally performed the services described in this documentation, which was scribed in my presence. The recorded information has been reviewed and considered.  Leshawn Houseworth Elayne Guerin, M.D. [facility] [address] [place], [place]  [phone] phone [phone] fax

## 2015-02-05 NOTE — Patient Instructions (Signed)

## 2015-02-10 ENCOUNTER — Inpatient Hospital Stay: Payer: Medicare Other | Admitting: Family Medicine

## 2015-02-12 ENCOUNTER — Ambulatory Visit (INDEPENDENT_AMBULATORY_CARE_PROVIDER_SITE_OTHER): Payer: Medicare Other | Admitting: Family Medicine

## 2015-02-12 VITALS — BP 130/80 | HR 75 | Temp 98.0°F | Ht 74.0 in | Wt 213.1 lb

## 2015-02-12 DIAGNOSIS — F909 Attention-deficit hyperactivity disorder, unspecified type: Secondary | ICD-10-CM

## 2015-02-12 DIAGNOSIS — I151 Hypertension secondary to other renal disorders: Secondary | ICD-10-CM

## 2015-02-12 DIAGNOSIS — F3181 Bipolar II disorder: Secondary | ICD-10-CM | POA: Diagnosis not present

## 2015-02-12 DIAGNOSIS — Z940 Kidney transplant status: Secondary | ICD-10-CM | POA: Diagnosis not present

## 2015-02-12 DIAGNOSIS — N2889 Other specified disorders of kidney and ureter: Secondary | ICD-10-CM

## 2015-02-12 DIAGNOSIS — N521 Erectile dysfunction due to diseases classified elsewhere: Secondary | ICD-10-CM

## 2015-02-12 DIAGNOSIS — I5022 Chronic systolic (congestive) heart failure: Secondary | ICD-10-CM | POA: Diagnosis not present

## 2015-02-12 DIAGNOSIS — N433 Hydrocele, unspecified: Secondary | ICD-10-CM

## 2015-02-12 DIAGNOSIS — F988 Other specified behavioral and emotional disorders with onset usually occurring in childhood and adolescence: Secondary | ICD-10-CM

## 2015-02-12 NOTE — Patient Instructions (Signed)
1.  Contact Dr. Johnsie Cancel regarding Adderall. 2.  Call [facility] for appointment.

## 2015-02-12 NOTE — Progress Notes (Addendum)
Subjective:    Patient ID: [name], male    DOB: 1971-06-19, 44 y.o.   MRN: [id number] This chart was scribed for Reginia Forts, MD by Zola Button, Medical Scribe. This patient was seen in Room 11 and the patient's care was started at 6:11 PM.   02/12/2015  Medication Refill   HPI  HPI Comments: [name] is a 44 y.o. male with a hx of ADHD, bipolar disorder, ESRD on hemodialysis, renal transplant, and CHF who presents to the [facility] to establish care and for referral to psychiatry. He is also here to establish care with a PCP. Patient had been on Klonopin and Abilify since 2011, but he was recently switched from Abilify to Zyprexa 2 months ago by psychiatry; he states he had his last manic episode then and he attributes this to the change of medications. In the past, he had been seeing a doctor at [facility] for his bipolar medications and another doctor for ADHD medications; bipolar psychiatrist refused to write Adderall for ADHD thus patient was seeing a separate psychiatrist for ADHD medications. He is not currently seeing any doctors for his behavioral health medications; he bipolar psychiatrist no longer accepts his insurance. He was first diagnosed with bipolar disorder in 2011, when he had his first manic episode. He notes that he cannot function well at work without his ADHD medications. Patient denies hx of drug addiction. He did contact [facility], but he did not schedule an appointment yet because they did not have any openings until the second week of May.  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  Patient had his last complete physical last year, but he does not remember when exactly. He did have his flu vaccine in September, 2015. He reports being UTD on vaccinations. His last eye exam was about 3-4 years ago per patient. He has not had a colonoscopy done yet; he was told he does not need one until he is 31. Patient is planning to  see a dentist later this year.  He had renal transplants done in 1999 and 2015 (December); he is followed by [facility] and sees them every Monday. He is scheduled to follow-up with them for 6-8 months, then to go back to see his regular nephrologist, Dr. Randa Ngo (also at [facility]) every 2 months. Patient notes that his energy levels have been good since his recent transplant. He did not take his ADHD medications when he had the last transplant as he was not working.  Patient was diagnosed with CHF in 2006. He did go see a cardiologist and was started on medications after the first visit. He had a catheterization done by Dr. Marlou Porch; he also had another catheterization done recently by Dr. Johnsie Cancel in 2014 or 2015. Patient denies having stents and hospitalizations due to CHF.  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 Patient plans on going into nursing and will start classes this summer. He wants to have his ADHD medications sorted out before then so he will be able to concentrate in class.  Review of Systems  Constitutional: Negative for fever, chills, diaphoresis, activity change, appetite change and fatigue.  Eyes: Negative for visual disturbance.  Respiratory: Negative for cough and shortness of breath.   Cardiovascular: Negative for chest pain, palpitations and leg swelling.  Gastrointestinal:  Negative for nausea, vomiting, abdominal pain and diarrhea.  Endocrine: Negative for cold intolerance, heat intolerance, polydipsia, polyphagia and polyuria.  Skin: Negative for color change, rash and wound.  Neurological: Negative for dizziness, tremors, seizures, syncope, facial asymmetry, speech difficulty, weakness, light-headedness, numbness and headaches.  Psychiatric/Behavioral: Negative for sleep disturbance and dysphoric mood. The patient is not nervous/anxious.     Past Medical  History  Diagnosis Date  . Hypertension   . Anemia   . Depression   . Blood transfusion   . CHF (congestive heart failure)   . Complication of anesthesia     Woke up intubated gets combative  afraid to be alone   . Anxiety   . Arthritis   . Blood transfusion without reported diagnosis   . ESRD on hemodialysis 07/13/2011    Glomerulonephritis at age 14, started HD in 02/16/1995.  Deceased donor renal transplant Feb 15, 1998 at [facility], then kidney failed and went back on HD in 2005/02/15.  L forearm AVF . s/p repeat renal transplant 10/2014 [facility].  Marland Kitchen Renal transplant, status post 11/25/2014    Pt with Glomerulonephritis at age 67. Deceased donor renal transplant 02-15-98 right and left renal transplant 10/2014.  Followed by [facility] transplant team; nephrologist is Dr. Barbette Merino.    Past Surgical History  Procedure Laterality Date  . Cholecystectomy    . Angioplasty    . Dialysis fistula creation    . Knee surgery    . Mouth surgery    . Nephrectomy transplanted organ    . Left heart catheterization with coronary angiogram N/A 10/01/2013    Procedure: LEFT HEART CATHETERIZATION WITH CORONARY ANGIOGRAM;  Surgeon: [name], MD;  Location: [facility] CATH LAB;  Service: Cardiovascular;  Laterality: N/A;  . Kidney transplant  10/17/14   Allergies  Allergen Reactions  . Delacort [Hydrocortisone Base] Nausea Only  . Benicar [Olmesartan]     Unknown  . Food Itching    bananas  . Hydralazine     Head ache  . Nitrates, Organic Other (See Comments)    Palpitations, headache   Current Outpatient Prescriptions  Medication Sig Dispense Refill  . amLODipine (NORVASC) 10 MG tablet Take 10 mg by mouth 2 (two) times daily before a meal.    . amoxicillin (AMOXIL) 400 MG/5ML suspension Take 500 mg by mouth.    Marland Kitchen aspirin EC 81 MG tablet Take 81 mg by mouth daily.    . carvedilol (COREG) 25 MG tablet Take 25 mg by mouth 2 (two) times daily with a meal.    . clonazePAM (KLONOPIN) 1 MG tablet Take 1.5 mg by mouth daily. 1  and 1/2  tabs    . cloNIDine (CATAPRES) 0.3 MG tablet Take 0.3 mg by mouth 2 (two) times daily.    . furosemide (LASIX) 80 MG tablet Take 80 mg by mouth 2 (two) times daily.    Marland Kitchen HYDROcodone-acetaminophen (NORCO/VICODIN) 5-325 MG per tablet Take 1 tablet by mouth every 6 (six) hours as needed for moderate pain. 30 tablet 0  . lidocaine (LMX) 4 % cream Apply 1 application topically daily as needed (when going to dialysis).    . mycophenolate (MYFORTIC) 180 MG EC tablet Take 720 mg by mouth 2 (two) times daily.    Marland Kitchen OLANZapine (ZYPREXA) 20 MG tablet Take 20 mg by mouth at bedtime.    . pantoprazole (PROTONIX) 40 MG tablet Take 40 mg by mouth daily.    . predniSONE (STERAPRED UNI-PAK) 5 MG TABS tablet Take 10 mg by  mouth daily.     . sodium bicarbonate 650 MG tablet Take 1,300 mg by mouth 2 (two) times daily.    Marland Kitchen sulfamethoxazole-trimethoprim (BACTRIM,SEPTRA) 400-80 MG per tablet Take 1 tablet by mouth 3 (three) times a week.    . tacrolimus (PROGRAF) 5 MG capsule Take 6 mg by mouth 2 (two) times daily.    . valGANciclovir (VALCYTE) 450 MG tablet Take by mouth daily.    . vardenafil (LEVITRA) 20 MG tablet Take 20 mg by mouth daily as needed for erectile dysfunction.     No current facility-administered medications for this visit.       Objective:    BP 130/80 mmHg  Pulse 75  Temp(Src) 98 F (36.7 C) (Oral)  Ht 6\' 2"  (1.88 m)  Wt 213 lb 2 oz (96.673 kg)  BMI 27.35 kg/m2  SpO2 100% Physical Exam  Constitutional: He is oriented to person, place, and time. He appears well-developed and well-nourished. No distress.  HENT:  Head: Normocephalic and atraumatic.  Right Ear: External ear normal.  Left Ear: External ear normal.  Nose: Nose normal.  Mouth/Throat: Oropharynx is clear and moist. No oropharyngeal exudate.  Eyes: Conjunctivae and EOM are normal. Pupils are equal, round, and reactive to light.  Neck: Normal range of motion. Neck supple. Carotid bruit is not present. No thyromegaly  present.  Well healed incision inferior aspect anterior neck from previous trach.  Cardiovascular: Normal rate, regular rhythm, normal heart sounds and intact distal pulses.  Exam reveals no gallop and no friction rub.   No murmur heard. Pulmonary/Chest: Effort normal and breath sounds normal. No respiratory distress. He has no wheezes. He has no rales.  CTAB.  Abdominal: Soft. Bowel sounds are normal. He exhibits no distension and no mass. There is no tenderness. There is no rebound and no guarding.  Musculoskeletal: He exhibits no edema.  No leg swelling.  Lymphadenopathy:    He has no cervical adenopathy.  Neurological: He is alert and oriented to person, place, and time. No cranial nerve deficit.  Skin: Skin is warm and dry. No rash noted. He is not diaphoretic.  Psychiatric: He has a normal mood and affect. His behavior is normal.  Nursing note and vitals reviewed.       Assessment & Plan:   1. Bipolar 2 disorder   2. ADD (attention deficit disorder)   3. Renal transplant, status post   4. Chronic systolic congestive heart failure   5. Hypertension secondary to other renal disorders   6. Hydrocele, right   7. Erectile dysfunction due to diseases classified elsewhere     1. Bipolar Disorder: controlled; refer to psychiatry; last manic episode in 12/2014.  Stable on Zyprexa and Klonopin.  Will not manage bipolar medications; warrants psychiatry mangement. 2.  ADD: uncontrolled; struggling with concentration with work; plans to start school over summer; recommend clearance from cardiology and transplant team prior to starting Adderall.  Defer management of ADD to psychiatry as well. 3.  Renal transplant pt: S/p renal transplant at [facility] 10/2014.  Second transplant.  Followed weekly by transplant team.  History of glomerulonephritis in twenties.   4.  Chronic systolic heart failure: euvolemic currently; recommend follow-up with cardiology to receive clearance to take  stimulant/Adderall. 5.  HTN: controlled.  Managed by Transplant Team at [facility] currently. 6.  R hydrocele: stable/improved; followed by urology. 7. ED: managed by urology.   No orders of the defined types were placed in this encounter.    No Follow-up  on file.    I personally performed the services described in this documentation, which was scribed in my presence. The recorded information has been reviewed and considered.  Casmira Cramer Elayne Guerin, M.D. [facility] [address] [place], [place]  [address] [phone] phone [phone] fax

## 2015-02-13 ENCOUNTER — Telehealth: Payer: Self-pay

## 2015-02-13 ENCOUNTER — Telehealth: Payer: Self-pay | Admitting: Cardiovascular Disease

## 2015-02-13 NOTE — Telephone Encounter (Signed)
New problem    Pt need a prescription for Adderall that his PCP physician will write for him, but b/c of pt's past heart condition and blood pressure issues will it be ok to prescribe a stimulant. Please advise pt.

## 2015-02-13 NOTE — Telephone Encounter (Signed)
Had normal cath in 2014 should be ok to use adderall

## 2015-02-13 NOTE — Telephone Encounter (Signed)
Pt saw Dr. Tamala Julian on 02/12/15 and she requested that he get clearance from his cardiologist before she would prescibe the ADDERALL due to history of heart condition. Pt states that his cardiologist faxed [place] the requested information and wants to know what the next step is in order to get the ADDERALL prescribed to him.

## 2015-02-13 NOTE — Telephone Encounter (Addendum)
PT AWARE OF  DR Mariana Arn  ANSWER NOTE  FAXED TO  DR  Reginia Forts

## 2015-02-13 NOTE — Telephone Encounter (Signed)
WILL FORWARD TO DR NISHAN FOR  REVIEW./CY 

## 2015-02-13 NOTE — Telephone Encounter (Signed)
Medical records have you seen this?

## 2015-02-20 NOTE — Telephone Encounter (Signed)
Patient's cardiology office (Dr. Johnsie Cancel with [facility]) is on Epic. Please see Dr. Kyla Balzarine notes.

## 2015-02-21 NOTE — Telephone Encounter (Signed)
Left message on machine to call back  

## 2015-02-21 NOTE — Telephone Encounter (Signed)
Call patient --- 1.  At his recent visit I recommended that he get clearance from cardiology before starting stimulants.  2. I referred him to psychiatry at his recent visit for psychiatry to manage his bipoloar disorder and his ADHD.  I recommend that his psychiatrist prescribe his stimulant/Adderall.

## 2015-02-26 NOTE — Telephone Encounter (Signed)
Dr. Tamala Julian should I tell patient no since you have already clarified that you wanted his psychiatrist or would you help him for a month of two? Please advise.

## 2015-02-26 NOTE — Telephone Encounter (Signed)
Patient says he has an appt on 03/17/15 with [facility], however, they have refused to give him this medication in the past. He says he has been looking around for others but they are booked out to late May. He says classes start on 03/16/15 and he wanted to get started on this medication before classes.

## 2015-02-27 ENCOUNTER — Other Ambulatory Visit: Payer: Self-pay | Admitting: Family Medicine

## 2015-02-27 MED ORDER — AMPHETAMINE-DEXTROAMPHET ER 20 MG PO CP24: 20.0000 mg | ORAL_CAPSULE | Freq: Every day | ORAL | Status: DC

## 2015-02-27 NOTE — Telephone Encounter (Signed)
Call --- 1.  Due to patient with bipolar disorder and ADHD, he needs to have his ADHD managed by psychiatry.  Thus, he needs to find a psychiatrist who will prescribe BOTH bipolar medications and ADHD medications. If [name] will not prescribe both medications when he goes to appointment on 03/17/15, then I recommend he find another psychiatrist in the area that will.  2. I will prescribe a one month rx only of Adderall.  It will be ready after 10:30am today.

## 2015-02-28 NOTE — Telephone Encounter (Signed)
Gave pt message.

## 2015-03-05 ENCOUNTER — Telehealth: Payer: Self-pay | Admitting: Cardiovascular Disease

## 2015-03-05 DIAGNOSIS — I5020 Unspecified systolic (congestive) heart failure: Secondary | ICD-10-CM

## 2015-03-05 NOTE — Telephone Encounter (Signed)
New Message  Pt wanted to let Dr. Johnsie Cancel know- pt is cancelling appt for 4/28 w/ [facility] and wanted to f/u only if Dr. Johnsie Cancel thought it was necessary. Please call back and discuss.

## 2015-03-06 ENCOUNTER — Ambulatory Visit: Payer: Medicare Other | Admitting: Physician Assistant

## 2015-03-09 ENCOUNTER — Telehealth: Payer: Self-pay

## 2015-03-09 NOTE — Telephone Encounter (Signed)
Can we re-write Rx?

## 2015-03-09 NOTE — Telephone Encounter (Signed)
Patient came in to get a refill for adderral and states that the insurance doesn't cover it. Patient states that the prescription is for capsules and it can only be covered if its written for tablets. Please call! [phone]

## 2015-03-10 NOTE — Telephone Encounter (Signed)
He will need to bring in original rx to me before receiving new rx for tablets.

## 2015-03-10 NOTE — Telephone Encounter (Signed)
Notified pt. He will bring in rx.

## 2015-03-11 MED ORDER — AMPHETAMINE-DEXTROAMPHETAMINE 10 MG PO TABS: 10.0000 mg | ORAL_TABLET | Freq: Two times a day (BID) | ORAL | Status: DC

## 2015-03-11 NOTE — Telephone Encounter (Signed)
Pt brought in original rx for Adderall XR 20mg  capsules.New rx for Adderall 10mg  tablets provided.

## 2015-03-11 NOTE — Telephone Encounter (Signed)
Patient came to bring the prescription to 102 and it's been sent to 104. He needs 20 mg tablets, not capsules. Please call when ready for pick up! [phone]

## 2015-03-12 NOTE — Telephone Encounter (Signed)
Notified pt Rx is ready at 102 and explained that new Rx for 10 mg BID would be the equivalent to the 20 mg ER that was orig Rxd for QD.

## 2015-03-16 NOTE — Telephone Encounter (Signed)
Needs updated echo and TSH  Can f/u with psych about abilify and renal.  If echo normal EF can see him PRN

## 2015-03-16 NOTE — Telephone Encounter (Signed)
WILL FORWARD TO DR NISHAN FOR  REVIEW./CY 

## 2015-03-18 NOTE — Telephone Encounter (Signed)
Follow Up   Estill Bamberg from [facility] calling to provide fax number: [phone] Attn: Estill Bamberg

## 2015-03-18 NOTE — Telephone Encounter (Signed)
LAB ORDER  FAXED .Adonis Housekeeper

## 2015-03-18 NOTE — Telephone Encounter (Signed)
PT  AWARE  TSH  ORDER  FAXED TO [facility]   AND  ORDER ENTERED  FOR   ECHO  ./CY

## 2015-04-02 ENCOUNTER — Ambulatory Visit (INDEPENDENT_AMBULATORY_CARE_PROVIDER_SITE_OTHER): Payer: Medicare Other | Admitting: Family Medicine

## 2015-04-02 VITALS — BP 140/80 | HR 99 | Temp 98.8°F | Ht 75.0 in | Wt 206.5 lb

## 2015-04-02 DIAGNOSIS — F3162 Bipolar disorder, current episode mixed, moderate: Secondary | ICD-10-CM | POA: Diagnosis not present

## 2015-04-02 DIAGNOSIS — N390 Urinary tract infection, site not specified: Secondary | ICD-10-CM

## 2015-04-02 DIAGNOSIS — Z940 Kidney transplant status: Secondary | ICD-10-CM | POA: Diagnosis not present

## 2015-04-02 LAB — POCT UA - MICROSCOPIC ONLY
Bacteria, U Microscopic: NEGATIVE
CRYSTALS, UR, HPF, POC: NEGATIVE
Casts, Ur, LPF, POC: NEGATIVE
MUCUS UA: NEGATIVE
RBC, URINE, MICROSCOPIC: NEGATIVE
WBC, UR, HPF, POC: NEGATIVE
YEAST UA: NEGATIVE

## 2015-04-02 LAB — POCT URINALYSIS DIPSTICK
Bilirubin, UA: NEGATIVE
Glucose, UA: NEGATIVE
Ketones, UA: NEGATIVE
LEUKOCYTES UA: NEGATIVE
Nitrite, UA: NEGATIVE
PH UA: 7
RBC UA: NEGATIVE
Spec Grav, UA: 1.015
UROBILINOGEN UA: 0.2

## 2015-04-02 NOTE — Progress Notes (Signed)
Subjective:  This chart was scribed for Wardell Honour, MD by Starleen Arms, Medical Scribe. This patient was seen in room 4 and the patient's care was started at 7:57 PM.   Patient ID: [name], male    DOB: 03/29/1971, 44 y.o.   MRN: [id number]  HPI HPI Comments: [name] is a 44 y.o. male who is a renal transplant recipient.  He was recently diagnosed with a UTI by his transplant team after a urine culture.  He was asymptomatic at the time and prescribed Keflex, which he finished on 5/21.  He denies any symptoms since this time and presents to [facility] for a urine culture follow/up.  He denies abdominal pain, nausea, fever, chills, diaphoresis, n/v, penile discharge, urinary changes.   Patient also has a history of bipolar 2 disorder and reports he has not slept for three days and that "it has been fun".  He is being followed by Dr. Silvio Pate, Psychiatrist, at the [facility].  His recently had a medication change from Zyprexa to Abilify.  He has not told Dr. Silvio Pate about this episode  Patient is starting school in [place] this upcoming August and has an appointment on 7/19 with a psychiatrist in [place] with plans to transfer his care there.    Chief Complaint  Patient presents with  . Follow-up    recheck Urine (wants cultured)-Completed Kelfex on Saturday and needs to make sure urine is clear.       Review of Systems  Constitutional: Negative for fever, chills and diaphoresis.  Gastrointestinal: Negative for nausea, vomiting and abdominal pain.  Genitourinary: Negative for dysuria, frequency and discharge.  Psychiatric/Behavioral: Positive for sleep disturbance.       Objective:   Physical Exam  Constitutional: He is oriented to person, place, and time. He appears well-developed and well-nourished. No distress.  HENT:  Head: Normocephalic and atraumatic.  Eyes: Conjunctivae and EOM are normal.  Neck: Neck supple. No tracheal deviation present.  Cardiovascular: Normal  rate.   HR 100   Pulmonary/Chest: Effort normal. No respiratory distress.  Lungs CTA.  Musculoskeletal: Normal range of motion.  Neurological: He is alert and oriented to person, place, and time.  Skin: Skin is warm and dry.  Psychiatric: He has a normal mood and affect. His behavior is normal.  Nursing note and vitals reviewed.   Results for orders placed or performed in visit on 04/02/15  POCT urinalysis dipstick  Result Value Ref Range   Color, UA yellow    Clarity, UA clear    Glucose, UA neg    Bilirubin, UA neg    Ketones, UA neg    Spec Grav, UA 1.015    Blood, UA neg    pH, UA 7.0    Protein, UA trace    Urobilinogen, UA 0.2    Nitrite, UA neg    Leukocytes, UA Negative   POCT UA - Microscopic Only  Result Value Ref Range   WBC, Ur, HPF, POC neg    RBC, urine, microscopic neg    Bacteria, U Microscopic neg    Mucus, UA neg    Epithelial cells, urine per micros 0-1    Crystals, Ur, HPF, POC neg    Casts, Ur, LPF, POC neg    Yeast, UA neg         Assessment & Plan:  8:06 PM Will obtain urine culture and urine dipstick screening.  Patient acknowledges and agrees with plan.  1. UTI (lower urinary tract infection)     I personally performed the services described in this documentation, which was scribed in my presence. The recorded information has been reviewed and considered.  Alcie Runions Elayne Guerin, M.D. [facility] [address] [place],   [address] [phone] phone [phone] fax

## 2015-04-04 LAB — URINE CULTURE
Colony Count: NO GROWTH
Organism ID, Bacteria: NO GROWTH

## 2015-06-07 NOTE — Progress Notes (Signed)
Patient ID: [name], male   DOB: 10-15-1971, 44 y.o.   MRN: [id number]   Cardiology Office Note   Date:  06/07/2015   ID:  [name], DOB Jul 25, 1971, MRN [id number]  PCP:  Clearance Coots, MD  Cardiologist:  Dr. [name]     History of Present Illness: [name] is a 44 y.o. 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  Prior EF 40-45%.  LHC 02-13-08):  No CAD, EF 65%.  Echo Feb 12, 2013):  Mild inf HK, mod LVH, EF 50%, mild to mod LAE, mild RVE, PASP 39, mod pericardial effusion (effusion similar to 02/13/2011).  Lexiscan Myoview (06/2013):  EF 62%, normal study.    Patient underwent LHC 09/2013 in preparation for repeat renal transplant.  This demonstrated no significant CAD (mid to dist LAD 20-30%), EF 55-65%.  He was cleared to proceed with transplant at that point.  He was unable to pursue transplant because of antibodies.  Was supposed to have echo 03/2015 but not done ? More fatigue Labs from dialysis reviewed and Hct stable 35  TSH normal     Recent Labs: No results found for requested labs within last 365 days. No results found for requested labs within last 365 days.  Wt Readings from Last 3 Encounters:  04/02/15 93.668 kg (206 lb 8 oz)  02/12/15 96.673 kg (213 lb 2 oz)  02/05/15 93.101 kg (205 lb 4 oz)     Past Medical History  Diagnosis Date  . Hypertension   . Anemia   . Depression   . Blood transfusion   . CHF (congestive heart failure)   . Complication of anesthesia     Woke up intubated gets combative  afraid to be alone   . Anxiety   . Arthritis   . Blood transfusion without reported diagnosis   . ESRD on hemodialysis 07/13/2011    Glomerulonephritis at age 53, started HD in 13-Feb-1995.  Deceased donor renal transplant 02/12/1998 at [facility], then kidney failed and went back on HD in Feb 12, 2005.  L forearm AVF . s/p repeat renal transplant 10/2014 [facility].  Marland Kitchen Renal transplant, status post 11/25/2014    Pt with Glomerulonephritis  at age 89. Deceased donor renal transplant 1998/02/12 right and left renal transplant 10/2014.  Followed by [facility] transplant team; nephrologist is Dr. Barbette Merino.     Current Outpatient Prescriptions  Medication Sig Dispense Refill  . amLODipine (NORVASC) 10 MG tablet Take 10 mg by mouth 2 (two) times daily before a meal.    . amphetamine-dextroamphetamine (ADDERALL) 10 MG tablet Take 1 tablet (10 mg total) by mouth 2 (two) times daily with a meal. 60 tablet 0  . aspirin EC 81 MG tablet Take 81 mg by mouth daily.    . carvedilol (COREG) 25 MG tablet Take 25 mg by mouth 2 (two) times daily with a meal.    . clonazePAM (KLONOPIN) 1 MG tablet Take 0.3 mg by mouth daily.     . cloNIDine (CATAPRES) 0.3 MG tablet Take 0.3 mg by mouth 2 (two) times daily.    . furosemide (LASIX) 80 MG tablet Take 80 mg by mouth 2 (two) times daily.    Marland Kitchen HYDROcodone-acetaminophen (NORCO/VICODIN) 5-325 MG per tablet Take 1 tablet by mouth every 6 (six) hours as needed for moderate pain. 30 tablet 0  . lidocaine (LMX) 4 % cream Apply 1 application topically daily as needed (when going to dialysis).    Marland Kitchen  mycophenolate (MYFORTIC) 180 MG EC tablet Take 720 mg by mouth 2 (two) times daily.    Marland Kitchen OLANZapine (ZYPREXA) 20 MG tablet Take 20 mg by mouth at bedtime.    . pantoprazole (PROTONIX) 40 MG tablet Take 40 mg by mouth daily.    . predniSONE (STERAPRED UNI-PAK) 5 MG TABS tablet Take 10 mg by mouth daily.     . sodium bicarbonate 650 MG tablet Take 1,300 mg by mouth 2 (two) times daily.    Marland Kitchen sulfamethoxazole-trimethoprim (BACTRIM,SEPTRA) 400-80 MG per tablet Take 1 tablet by mouth 3 (three) times a week.    . tacrolimus (PROGRAF) 5 MG capsule Take 6 mg by mouth 2 (two) times daily.    . valGANciclovir (VALCYTE) 450 MG tablet Take by mouth daily.    . vardenafil (LEVITRA) 20 MG tablet Take 20 mg by mouth daily as needed for erectile dysfunction.     No current facility-administered medications for this visit.     Allergies:   Delacort; Benicar; Food; Hydralazine; and Nitrates, organic   Social History:  The patient  reports that he has never smoked. He has never used smokeless tobacco. He reports that he does not drink alcohol or use illicit drugs.   Family History:  The patient's family history includes Arthritis in his father and mother; Depression in his father and mother; Diabetes in his father and mother; Hypertension in his father and mother.   ROS:  Please see the history of present illness.  Denies any bleeding problems. Denies fever or cough.  All other systems reviewed and negative.   PHYSICAL EXAM: VS:  There were no vitals taken for this visit. Well nourished, well developed, in no acute distress HEENT: normal Neck: no JVD Cardiac:  normal S1, S2; RRR; no murmur Lungs:  clear to auscultation bilaterally, no wheezing, rhonchi or rales Abd: soft, nontender, no hepatomegaly Ext: no edema Skin: warm and dry Neuro:  CNs 2-12 intact, no focal abnormalities noted  EKG:   NSR, HR 64, inf-lat TWI, similar to prior tracing   ASSESSMENT AND PLAN:  1. Fatigue: Etiology not entirely clear. Ejection fraction has been normal by recent assessments. He has no evidence of difficult to control volume. He is not certain if he snores. He certainly has body habitus for sleep apnea. He tells me that his recent hemoglobin was normal. I will arrange TSH and LFTs. I will arrange sleep study to rule out sleep apnea. Recent cardiac catheterization demonstrated no significant CAD. He recently stopped his Abilify. I have asked him to followup with psychiatry for further management of his depression. 2. NICM:  EF has recovered.  No significant CAD on LHC in 09/2013.  Med Rx Has previously been limited by hypotension at dialysis.   Blood pressures better now.  Continue beta blocker. Resume ACE inhibitor with lisinopril 2.5 mg daily.  If fatigue worsens, continued repeat echocardiogram. 3. HFpEF:  Stable. Volume  managed by dialysis. 4. Hypertension:   Fair control. 5. ESRD:   He is on dialysis MWF.   6. Disposition:  F/u with Dr. [name] in 3 months.   [name]

## 2015-06-09 ENCOUNTER — Encounter: Payer: Medicare Other | Admitting: Cardiovascular Disease

## 2015-06-11 ENCOUNTER — Encounter: Payer: Self-pay | Admitting: Cardiovascular Disease

## 2015-07-29 ENCOUNTER — Encounter: Payer: Self-pay | Admitting: Physician Assistant

## 2015-07-29 DIAGNOSIS — I313 Pericardial effusion (noninflammatory): Secondary | ICD-10-CM | POA: Insufficient documentation

## 2015-07-29 DIAGNOSIS — I3139 Other pericardial effusion (noninflammatory): Secondary | ICD-10-CM | POA: Insufficient documentation

## 2015-07-29 NOTE — Progress Notes (Deleted)
Cardiology Office Note Date:  07/29/2015  Patient ID:  Mark, [name] 11-27-1970, MRN [id number] PCP:  Clearance Coots, MD  Cardiologist:  Johnsie Cancelrefresh   Chief Complaint: f/u history of CHF***  History of Present Illness: [name] is a 44 y.o. male with history of ESRD (h/o glomerulonephritis) on HD s/p renal transplant 1998-02-08 and 02/08/2014), ADHD, bipolar disorder, chronic diastolic cHF, HTN (managed by Transplant Team at [facility]), ED who presents for follow-up.  Per prior notes at one point he may have had an EF of 45% but we do not have records of this. He has history of LHC in 2008/02/09 - No CAD, EF 65%. Echo February 08, 2013): Mild inf HK, mod LVH, EF 50%, mild to mod LAE, mild RVE, PASP 39, mod pericardial effusion (effusion similar to February 09, 2011).Lexiscan Myoview (06/2013): EF 62%, normal study. He underwent LHC 09/2013 in preparation for repeat renal transplant demonstrating no significant CAD (only mid to dist LAD 20-30%), EF 55-65%.    ?repeat echo   Past Medical History  Diagnosis Date  . Hypertension   . Anemia   . Depression   . Blood transfusion   . Chronic diastolic CHF (congestive heart failure)   . Complication of anesthesia     Woke up intubated gets combative  afraid to be alone   . Anxiety   . Arthritis   . Blood transfusion without reported diagnosis   . ESRD on hemodialysis 07/13/2011    Glomerulonephritis at age 31, started HD in 02/09/1995.  Deceased donor renal transplant 02/08/98 at [facility], then kidney failed and went back on HD in 02/08/05.  L forearm AVF . s/p repeat renal transplant 10/2014 [facility].  Marland Kitchen Renal transplant, status post 11/25/2014    Pt with Glomerulonephritis at age 28. Deceased donor renal transplant 02-08-98 right and left renal transplant 10/2014.  Followed by [facility] transplant team; nephrologist is Dr. Barbette Merino.   . ADHD (attention deficit hyperactivity disorder)   . Bipolar disorder   . Erectile dysfunction   . Pericardial effusion     Past Surgical History    Procedure Laterality Date  . Cholecystectomy    . Angioplasty    . Dialysis fistula creation    . Knee surgery    . Mouth surgery    . Nephrectomy transplanted organ    . Left heart catheterization with coronary angiogram N/A 10/01/2013    Procedure: LEFT HEART CATHETERIZATION WITH CORONARY ANGIOGRAM;  Surgeon: [name], MD;  Location: [facility] CATH LAB;  Service: Cardiovascular;  Laterality: N/A;  . Kidney transplant  10/17/14    Current Outpatient Prescriptions  Medication Sig Dispense Refill  . amLODipine (NORVASC) 10 MG tablet Take 10 mg by mouth 2 (two) times daily before a meal.    . amphetamine-dextroamphetamine (ADDERALL) 10 MG tablet Take 1 tablet (10 mg total) by mouth 2 (two) times daily with a meal. 60 tablet 0  . aspirin EC 81 MG tablet Take 81 mg by mouth daily.    . carvedilol (COREG) 25 MG tablet Take 25 mg by mouth 2 (two) times daily with a meal.    . clonazePAM (KLONOPIN) 1 MG tablet Take 0.3 mg by mouth daily.     . cloNIDine (CATAPRES) 0.3 MG tablet Take 0.3 mg by mouth 2 (two) times daily.    . furosemide (LASIX) 80 MG tablet Take 80 mg by mouth 2 (two) times daily.    Marland Kitchen HYDROcodone-acetaminophen (NORCO/VICODIN) 5-325 MG per tablet Take 1 tablet by mouth every 6 (  six) hours as needed for moderate pain. 30 tablet 0  . lidocaine (LMX) 4 % cream Apply 1 application topically daily as needed (when going to dialysis).    . mycophenolate (MYFORTIC) 180 MG EC tablet Take 720 mg by mouth 2 (two) times daily.    Marland Kitchen OLANZapine (ZYPREXA) 20 MG tablet Take 20 mg by mouth at bedtime.    . pantoprazole (PROTONIX) 40 MG tablet Take 40 mg by mouth daily.    . predniSONE (STERAPRED UNI-PAK) 5 MG TABS tablet Take 10 mg by mouth daily.     . sodium bicarbonate 650 MG tablet Take 1,300 mg by mouth 2 (two) times daily.    Marland Kitchen sulfamethoxazole-trimethoprim (BACTRIM,SEPTRA) 400-80 MG per tablet Take 1 tablet by mouth 3 (three) times a week.    . tacrolimus (PROGRAF) 5 MG capsule Take 6 mg  by mouth 2 (two) times daily.    . valGANciclovir (VALCYTE) 450 MG tablet Take by mouth daily.    . vardenafil (LEVITRA) 20 MG tablet Take 20 mg by mouth daily as needed for erectile dysfunction.     No current facility-administered medications for this visit.    Allergies:   Delacort; Benicar; Food; Hydralazine; and Nitrates, organic   Social History:  The patient  reports that he has never smoked. He has never used smokeless tobacco. He reports that he does not drink alcohol or use illicit drugs.   Family History:  The patient's family history includes Arthritis in his father and mother; Depression in his father and mother; Diabetes in his father and mother; Hypertension in his father and mother.***  ROS:  Please see the history of present illness. Otherwise, review of systems is positive for ***.   All other systems are reviewed and otherwise negative.   PHYSICAL EXAM: *** VS:  There were no vitals taken for this visit. BMI: There is no weight on file to calculate BMI. Well nourished, well developed, in no acute distress HEENT: normocephalic, atraumatic Neck: no JVD, carotid bruits or masses Cardiac:  normal S1, S2; RRR; no murmurs, rubs, or gallops Lungs:  clear to auscultation bilaterally, no wheezing, rhonchi or rales Abd: soft, nontender, no hepatomegaly, + BS MS: no deformity or atrophy Ext: no edema Skin: warm and dry, no rash Neuro:  moves all extremities spontaneously, no focal abnormalities noted, follows commands Psych: euthymic mood, full affect   EKG:  Done today shows ***  Recent Labs: No results found for requested labs within last 365 days.  No results found for requested labs within last 365 days.   CrCl cannot be calculated (Unknown ideal weight.).   Wt Readings from Last 3 Encounters:  04/02/15 206 lb 8 oz (93.668 kg)  02/12/15 213 lb 2 oz (96.673 kg)  02/05/15 205 lb 4 oz (93.101 kg)     Other studies reviewed: Additional studies/records reviewed  today include: summarized above***  ASSESSMENT AND PLAN:  1. Chronic diastolic CHF 2. Hypertension 3. ESRD previously s/p HD, renal transplant x 2 -  4. H/o pericardial effusion  Disposition: F/u with ***  Current medicines are reviewed at length with the patient today.  The patient did not have any concerns regarding medicines.***  Signed, Melina Copa PA-C 07/29/2015 8:34 PM     [place] [address] [phone] (office)  [phone] (fax)

## 2015-07-30 ENCOUNTER — Encounter: Payer: Medicare Other | Admitting: Physician Assistant

## 2015-07-30 NOTE — Progress Notes (Signed)
This encounter was created in error - please disregard.

## 2015-11-09 ENCOUNTER — Encounter: Payer: Self-pay | Admitting: Physician Assistant

## 2015-11-09 NOTE — Progress Notes (Signed)
Cardiology Office Note Date:  11/10/2015  Patient ID:  [name] [birth year], MRN [id number] PCP:  Clearance Coots, MD  Cardiologist:  Dr. Johnsie Cancel  Chief Complaint: overdue for follow-up  History of Present Illness: [name] is a 45 y.o. male with history of NICM/chronic diastolic CHF (normalized EF since since then), ESRD previously on HD, s/p renal transplant (failed after 7 years, with repeat transplant 10/2014), HTN, bipolar disorder/ADD, anemia who presents for f/u. Prior EF 40-45%.LHC (01/2008): No CAD, EF 65%. Echo (01/2013): Mild inf HK, mod LVH, EF 50%, mild to mod LAE, mild RVE, PASP 39, mod pericardial effusion (effusion similar to 2012).He underwent LHC 09/2013 in preparation for repeat renal transplant which demonstrated no significant CAD (mid to dist LAD 20-30%), EF 55-65%.He was cleared to proceed with transplant at that point. He was initially unable to pursue transplant because of antibodies but then underwent pheresis and received a new kidney in 10/2014.  He reports he is doing well from a cardiac perspective. Denies any CP, SOB, syncope, LEE, orthopnea. Weight is up but he feels this is is "normal weight" (reports 206 was previously too low for him to sustain). He remains off dialysis. He walks 3/4 of a mile regularly without any functional limitation.   Past Medical History  Diagnosis Date  . Hypertension   . Anemia   . Depression   . Blood transfusion   . Chronic diastolic CHF (congestive heart failure) ([place])   . Complication of anesthesia     Woke up intubated gets combative  afraid to be alone   . Anxiety   . Arthritis   . Blood transfusion without reported diagnosis   . ESRD on hemodialysis ([place]) 07/13/2011    Glomerulonephritis at age 13, started HD in 41.  Deceased donor renal transplant 1999 at [facility], then kidney failed and went back on HD in 2006.  L forearm AVF . s/p repeat renal transplant 10/2014 [facility].  Marland Kitchen Renal transplant, status post 11/25/2014   Pt with Glomerulonephritis at age 74. Deceased donor renal transplant 1999 right and left renal transplant 10/2014.  Followed by [facility] transplant team; nephrologist is Dr. Barbette Merino.   . ADHD (attention deficit hyperactivity disorder)   . Bipolar disorder ([place])   . Erectile dysfunction   . Pericardial effusion     a. Mod by echo in 2014, similar to prior.  Marland Kitchen NICM (nonischemic cardiomyopathy) [facility])     Past Surgical History  Procedure Laterality Date  . Cholecystectomy    . Angioplasty    . Dialysis fistula creation    . Knee surgery    . Mouth surgery    . Nephrectomy transplanted organ    . Left heart catheterization with coronary angiogram N/A 10/01/2013    Procedure: LEFT HEART CATHETERIZATION WITH CORONARY ANGIOGRAM;  Surgeon: [name], MD;  Location: [facility] CATH LAB;  Service: Cardiovascular;  Laterality: N/A;  . Kidney transplant  10/17/14    Current Outpatient Prescriptions  Medication Sig Dispense Refill  . amLODipine (NORVASC) 10 MG tablet Take 10 mg by mouth 2 (two) times daily before a meal.    . amphetamine-dextroamphetamine (ADDERALL) 10 MG tablet Take 1 tablet (10 mg total) by mouth 2 (two) times daily with a meal. 60 tablet 0  . aspirin EC 81 MG tablet Take 81 mg by mouth daily.    . carvedilol (COREG) 25 MG tablet Take 25 mg by mouth 2 (two) times daily with a meal.    . clonazePAM (KLONOPIN)  1 MG tablet Take 0.3 mg by mouth daily.     . cloNIDine (CATAPRES) 0.3 MG tablet Take 0.3 mg by mouth 2 (two) times daily.    . furosemide (LASIX) 80 MG tablet Take 80 mg by mouth 2 (two) times daily.    Marland Kitchen HYDROcodone-acetaminophen (NORCO/VICODIN) 5-325 MG per tablet Take 1 tablet by mouth every 6 (six) hours as needed for moderate pain. 30 tablet 0  . lidocaine (LMX) 4 % cream Apply 1 application topically daily as needed (when going to dialysis).    . mycophenolate (MYFORTIC) 180 MG EC tablet Take 720 mg by mouth 2 (two) times daily.    Marland Kitchen OLANZapine (ZYPREXA) 20 MG  tablet Take 20 mg by mouth at bedtime.    . pantoprazole (PROTONIX) 40 MG tablet Take 40 mg by mouth daily.    . predniSONE (STERAPRED UNI-PAK) 5 MG TABS tablet Take 10 mg by mouth daily.     . sodium bicarbonate 650 MG tablet Take 1,300 mg by mouth 2 (two) times daily.    Marland Kitchen sulfamethoxazole-trimethoprim (BACTRIM,SEPTRA) 400-80 MG per tablet Take 1 tablet by mouth 3 (three) times a week.    . tacrolimus (PROGRAF) 5 MG capsule Take 6 mg by mouth 2 (two) times daily.    . valGANciclovir (VALCYTE) 450 MG tablet Take by mouth daily.    . vardenafil (LEVITRA) 20 MG tablet Take 20 mg by mouth daily as needed for erectile dysfunction.     No current facility-administered medications for this visit.    Allergies:   Delacort; Benicar; Food; Hydralazine; and Nitrates, organic   Social History:  The patient  reports that he has never smoked. He has never used smokeless tobacco. He reports that he does not drink alcohol or use illicit drugs.   Family History:  The patient's family history includes Arthritis in his father and mother; Depression in his father and mother; Diabetes in his father and mother; Hypertension in his father and mother.  ROS:  Please see the history of present illness.    All other systems are reviewed and otherwise negative.   PHYSICAL EXAM:  VS:  BP 126/90 mmHg  Pulse 70  Ht 6\' 3"  (1.905 m)  Wt 230 lb (104.327 kg)  BMI 28.75 kg/m2 BMI: Body mass index is 28.75 kg/(m^2). Well nourished, well developed AAM, in no acute distress HEENT: normocephalic, atraumatic Neck: no JVD, carotid bruits or masses Cardiac:  normal S1, S2; RRR; no murmurs, rubs, or gallops Lungs:  clear to auscultation bilaterally, no wheezing, rhonchi or rales Abd: soft, nontender, no hepatomegaly, + BS MS: no deformity or atrophy Ext: no edema Skin: warm and dry, no rash Neuro:  moves all extremities spontaneously, no focal abnormalities noted, follows commands Psych: euthymic mood, full  affect   EKG:  Done today shows NSR 70bpm, LVH with repol abnormalities - nonspecific TWI III, IIII, avF, V5-V6. No sig change from 05/2014.  Recent Labs: No results found for requested labs within last 365 days.  No results found for requested labs within last 365 days.   CrCl cannot be calculated (Patient has no serum creatinine result on file.).   Wt Readings from Last 3 Encounters:  11/10/15 230 lb (104.327 kg)  04/02/15 206 lb 8 oz (93.668 kg)  02/12/15 213 lb 2 oz (96.673 kg)     Other studies reviewed: Additional studies/records reviewed today include: summarized above  ASSESSMENT AND PLAN:  1. Chronic diastolic CHF/NICM - doing well from cardiac perspective. Dr. Johnsie Cancel  wanted him to have a f/u echo last year to make sure he was OK to continue Adderall with regards to his EF. The patient says he did not have this done because he was in the hospital quite a bit during that time. Will go ahead and obtain that echo. Reviewed daily weights and observation for symptoms.  2. Essential HTN - continue current regimen. 3. ESRD previously on HD, now s/p repeat renal transplant - per nephrology.  4. Pericardial effusion - f/u echocardiogram to demonstrate stability. No clinical evidence of complications.  Disposition: F/u with Dr. Johnsie Cancel in 6 months.  Current medicines are reviewed at length with the patient today.  The patient did not have any concerns regarding medicines.  Mark Ache PA-C 11/10/2015 10:19 AM     [facility] [address] [place] [address] [phone] (office)  [phone] (fax)

## 2015-11-10 ENCOUNTER — Encounter: Payer: Self-pay | Admitting: Physician Assistant

## 2015-11-10 ENCOUNTER — Ambulatory Visit (INDEPENDENT_AMBULATORY_CARE_PROVIDER_SITE_OTHER): Payer: Medicare Other | Admitting: Physician Assistant

## 2015-11-10 VITALS — BP 126/90 | HR 70 | Ht 75.0 in | Wt 230.0 lb

## 2015-11-10 DIAGNOSIS — Z992 Dependence on renal dialysis: Secondary | ICD-10-CM

## 2015-11-10 DIAGNOSIS — I5032 Chronic diastolic (congestive) heart failure: Secondary | ICD-10-CM

## 2015-11-10 DIAGNOSIS — I428 Other cardiomyopathies: Secondary | ICD-10-CM

## 2015-11-10 DIAGNOSIS — N186 End stage renal disease: Secondary | ICD-10-CM | POA: Diagnosis not present

## 2015-11-10 DIAGNOSIS — I10 Essential (primary) hypertension: Secondary | ICD-10-CM

## 2015-11-10 DIAGNOSIS — I3139 Other pericardial effusion (noninflammatory): Secondary | ICD-10-CM

## 2015-11-10 DIAGNOSIS — I429 Cardiomyopathy, unspecified: Secondary | ICD-10-CM | POA: Diagnosis not present

## 2015-11-10 DIAGNOSIS — I319 Disease of pericardium, unspecified: Secondary | ICD-10-CM

## 2015-11-10 DIAGNOSIS — I313 Pericardial effusion (noninflammatory): Secondary | ICD-10-CM

## 2015-11-10 NOTE — Patient Instructions (Signed)

## 2015-11-21 IMAGING — CR DG CHEST 2V
2 series · 2 of 2 positions shown · non-contrast
Comparison: Prior chest x-ray 01/07/2013

CLINICAL DATA: Cough

EXAM:
CHEST  2 VIEW

[w chest pa]
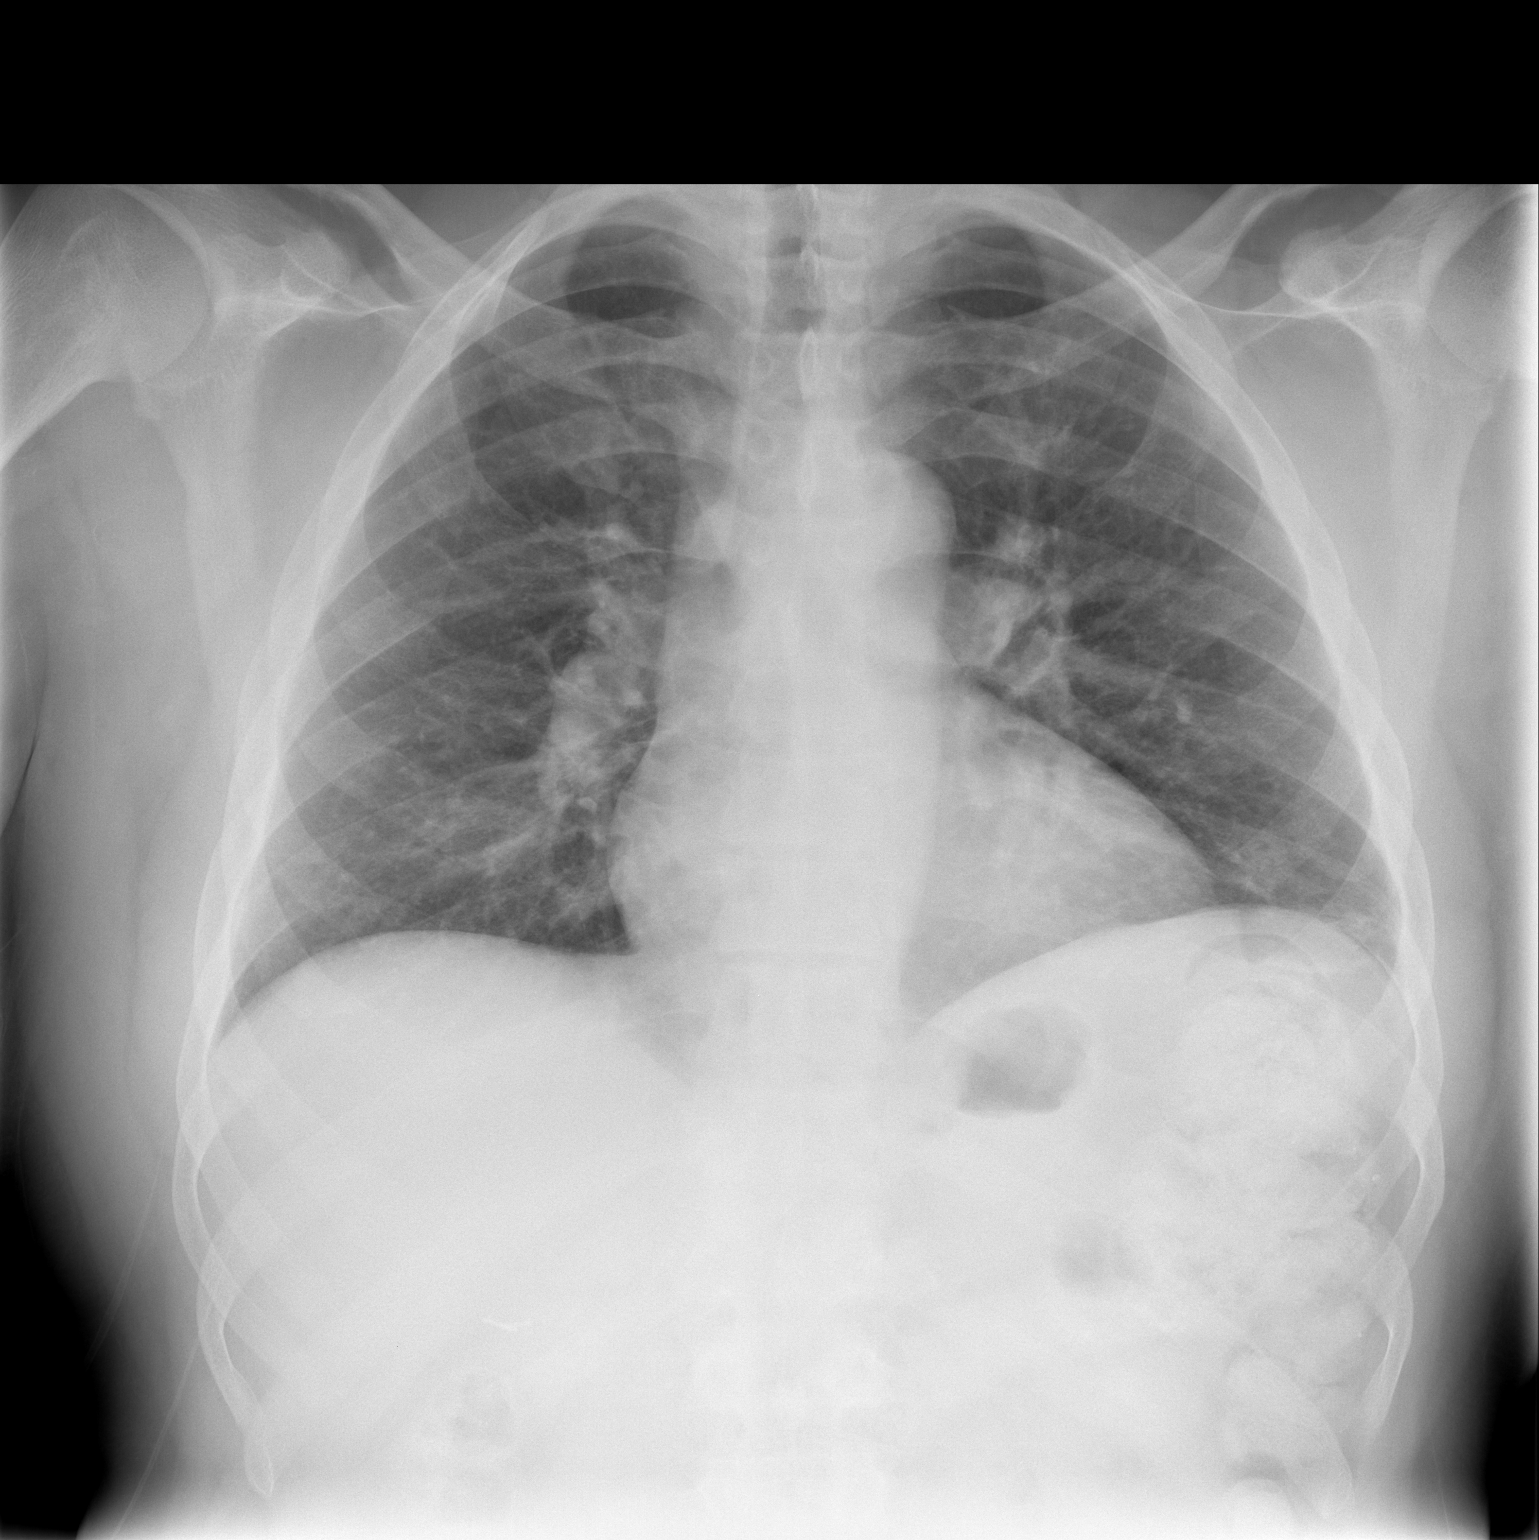

[w chest lat]
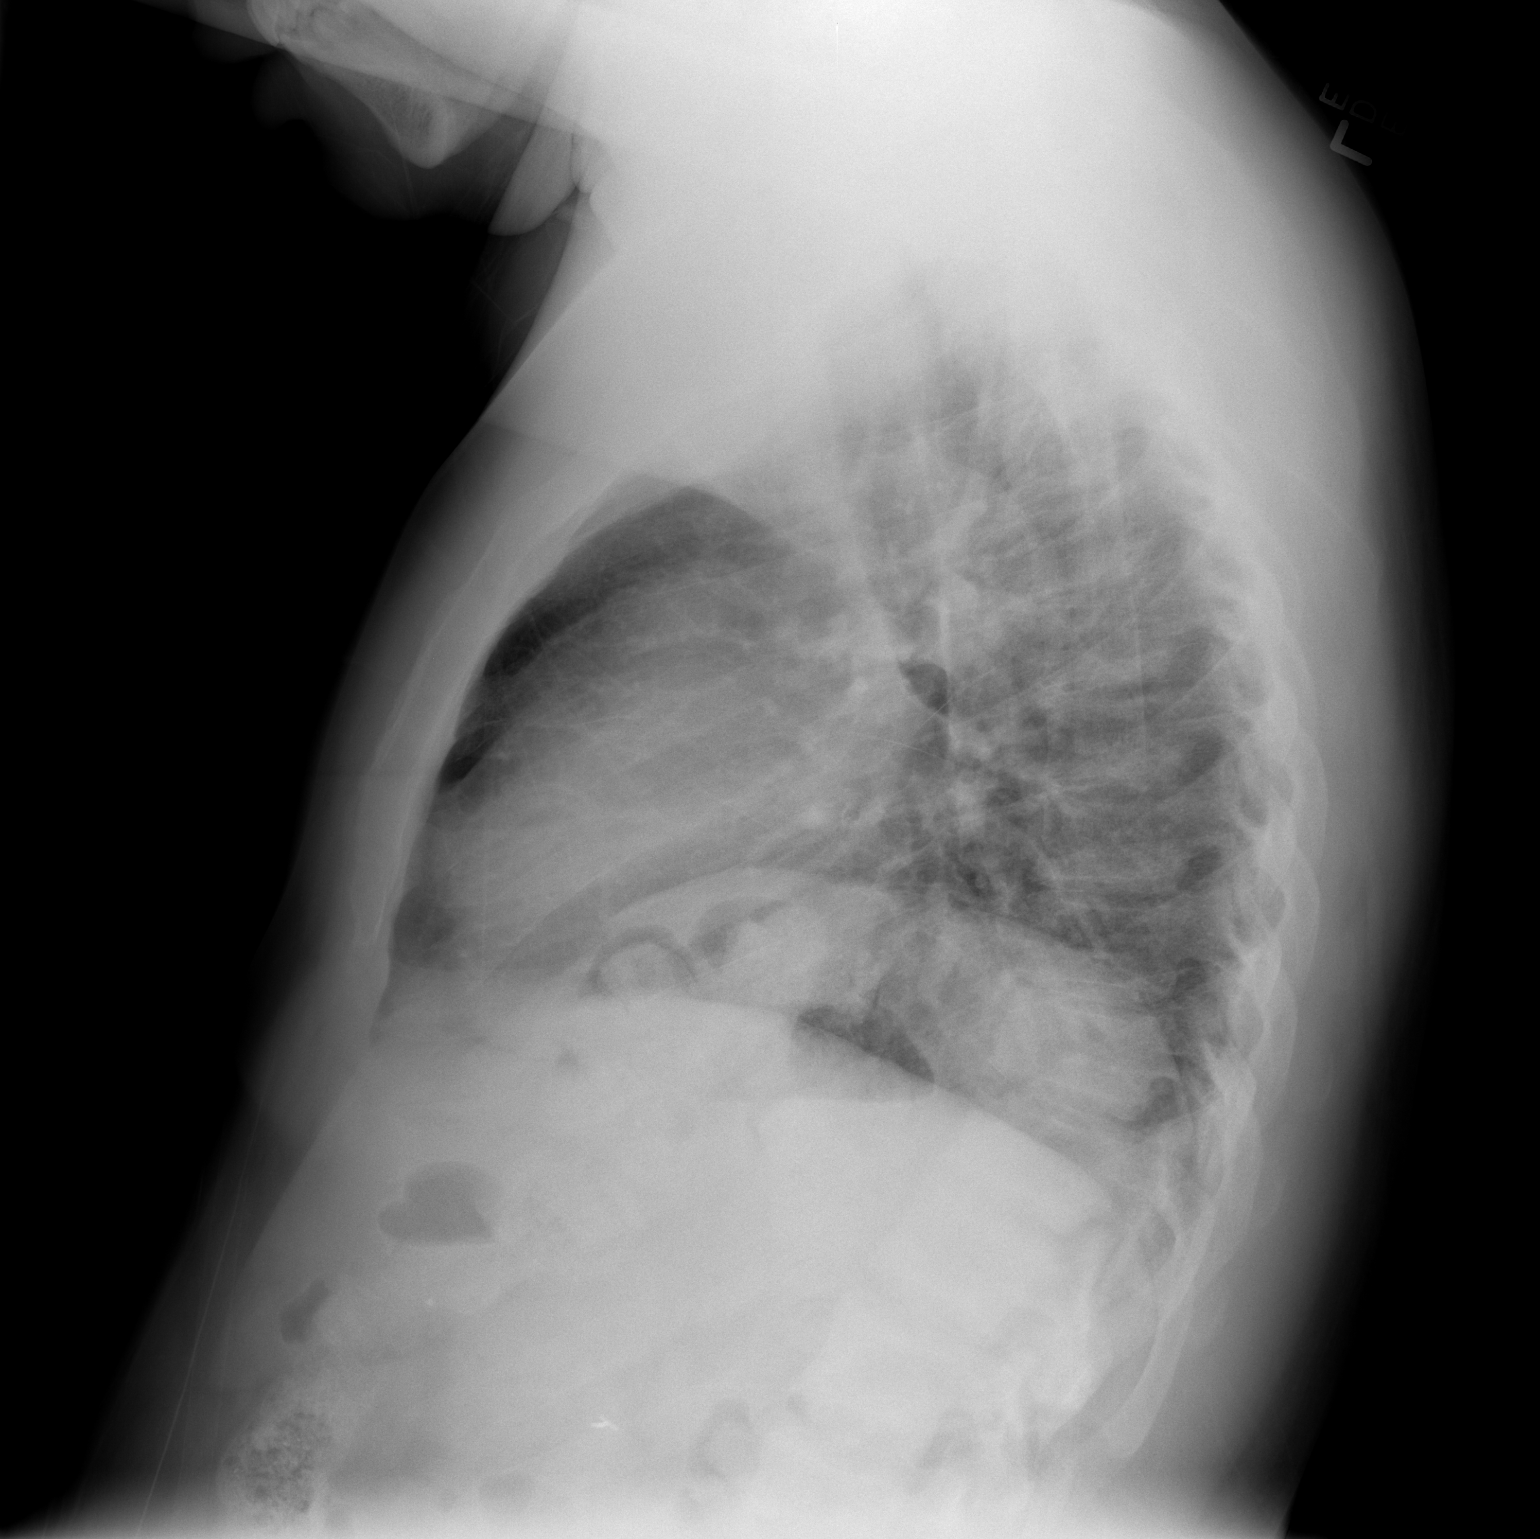

[2 of 2 positions shown; findings below may reference images not displayed]

FINDINGS: Significantly improved appearance of the cardiopericardial
silhouette. The heart silhouette is now within normal limits.
Pulmonary aeration is also improved. There is mild central airway
thickening and peribronchial cuffing which is similar compared to
prior. No focal airspace consolidation, pleural effusion or
pneumothorax. Surgical clips in the right upper quadrant suggest
prior cholecystectomy. No acute osseous abnormality.
IMPRESSION: 1. No active cardiopulmonary process.
2. Significant interval improvement in the previously identified
enlargement of the cardiopericardial silhouette. This likely
reflects resolution of pericardial effusion.
3. Mild residual central bronchitic changes and interstitial
prominence likely reflect chronic changes.

## 2015-11-24 ENCOUNTER — Other Ambulatory Visit: Payer: Self-pay

## 2015-11-24 ENCOUNTER — Ambulatory Visit (HOSPITAL_COMMUNITY): Payer: Medicare Other | Attending: Cardiology

## 2015-11-24 ENCOUNTER — Encounter: Payer: Self-pay | Admitting: Physician Assistant

## 2015-11-24 DIAGNOSIS — I5031 Acute diastolic (congestive) heart failure: Secondary | ICD-10-CM | POA: Diagnosis present

## 2015-11-24 DIAGNOSIS — I358 Other nonrheumatic aortic valve disorders: Secondary | ICD-10-CM | POA: Insufficient documentation

## 2015-11-24 DIAGNOSIS — I120 Hypertensive chronic kidney disease with stage 5 chronic kidney disease or end stage renal disease: Secondary | ICD-10-CM | POA: Diagnosis not present

## 2015-11-24 DIAGNOSIS — I77810 Thoracic aortic ectasia: Secondary | ICD-10-CM | POA: Diagnosis not present

## 2015-11-24 DIAGNOSIS — I517 Cardiomegaly: Secondary | ICD-10-CM | POA: Diagnosis not present

## 2015-11-24 DIAGNOSIS — Z940 Kidney transplant status: Secondary | ICD-10-CM | POA: Diagnosis not present

## 2015-11-24 DIAGNOSIS — N186 End stage renal disease: Secondary | ICD-10-CM | POA: Insufficient documentation

## 2015-11-24 DIAGNOSIS — I5032 Chronic diastolic (congestive) heart failure: Secondary | ICD-10-CM | POA: Diagnosis not present

## 2015-11-28 ENCOUNTER — Ambulatory Visit (INDEPENDENT_AMBULATORY_CARE_PROVIDER_SITE_OTHER): Payer: Medicare Other | Admitting: Family Medicine

## 2015-11-28 VITALS — BP 158/80 | HR 88 | Temp 98.4°F | Resp 16 | Ht 75.0 in | Wt 228.0 lb

## 2015-11-28 DIAGNOSIS — N184 Chronic kidney disease, stage 4 (severe): Secondary | ICD-10-CM | POA: Diagnosis not present

## 2015-11-28 DIAGNOSIS — R300 Dysuria: Secondary | ICD-10-CM | POA: Diagnosis not present

## 2015-11-28 DIAGNOSIS — R3916 Straining to void: Secondary | ICD-10-CM | POA: Diagnosis not present

## 2015-11-28 DIAGNOSIS — R350 Frequency of micturition: Secondary | ICD-10-CM

## 2015-11-28 DIAGNOSIS — Z940 Kidney transplant status: Secondary | ICD-10-CM | POA: Diagnosis not present

## 2015-11-28 LAB — POCT URINALYSIS DIP (MANUAL ENTRY)
BILIRUBIN UA: NEGATIVE
Bilirubin, UA: NEGATIVE
Glucose, UA: NEGATIVE
Leukocytes, UA: NEGATIVE
Nitrite, UA: NEGATIVE
PH UA: 6
SPEC GRAV UA: 1.025
UROBILINOGEN UA: 0.2

## 2015-11-28 LAB — POC MICROSCOPIC URINALYSIS (UMFC): Mucus: ABSENT

## 2015-11-28 MED ORDER — AMOXICILLIN 875 MG PO TABS: 875.0000 mg | ORAL_TABLET | Freq: Two times a day (BID) | ORAL | Status: DC

## 2015-11-28 NOTE — Progress Notes (Signed)
Patient ID: [name], male    DOB: 01-Sep-1971  Age: 45 y.o. MRN: [id number]  Chief Complaint  Patient presents with  . Follow-up    UTI     Subjective:   45 year old man who has seen Dr. Tamala Julian in the past usually. 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 He has had a number of urinary tract infections from [facility]. They are seeing him next week at the transplant center as well as he is seeing his urologist next week. He is not certain why he keeps getting the same bacterial infection. He started having urinary frequency and dysuria yesterday. He drank a lot of water and it is not having the discomfort that he did today. No fever chills or generalized illness. He came on in to catch it early and get on antibiotics until he sees the specialist. He works as a Biomedical scientist. No other major concerns today.  Current allergies, medications, problem list, past/family and social histories reviewed.  Objective:  BP 158/80 mmHg  Pulse 88  Temp(Src) 98.4 F (36.9 C) (Oral)  Resp 16  Ht 6\' 3"  (1.905 m)  Wt 228 lb (103.42 kg)  BMI 28.50 kg/m2  SpO2 98%  No acute distress. No CVA tenderness. Abdomen soft. Transplanted kidneys palpable. Penis appears normal with no inflammation of the glans  Assessment & Plan:   Assessment: 1. Strains to urinate   2. Urinary frequency   3. Dysuria   4. Chronic kidney disease (CKD), stage IV (severe) ([place])   5. Kidney transplant recipient       Plan:  Check urinalysis  Orders Placed This Encounter  Procedures  . Urine culture  . POCT Microscopic Urinalysis ([facility])  . POCT urinalysis dipstick   Results for orders placed or performed in visit on 11/28/15  POCT Microscopic Urinalysis ([facility])  Result Value Ref Range   WBC,UR,HPF,POC None None WBC/hpf   RBC,UR,HPF,POC Few (A) None RBC/hpf   Bacteria Few (A) None, Too numerous to count   Mucus Absent Absent   Epithelial Cells, UR Per Microscopy Few (A)  None, Too numerous to count cells/hpf  POCT urinalysis dipstick  Result Value Ref Range   Color, UA yellow yellow   Clarity, UA clear clear   Glucose, UA negative negative   Bilirubin, UA negative negative   Ketones, POC UA negative negative   Spec Grav, UA 1.025    Blood, UA small (A) negative   pH, UA 6.0    Protein Ur, POC >=300 (A) negative   Urobilinogen, UA 0.2    Nitrite, UA Negative Negative   Leukocytes, UA Negative Negative   will treat because of his history until the culture comes back.  Meds ordered this encounter  Medications  . amoxicillin (AMOXIL) 875 MG tablet    Sig: Take 1 tablet (875 mg total) by mouth 2 (two) times daily.    Dispense:  6 tablet    Refill:  0         Patient Instructions  Take the amoxicillin 875 mg 1 pill twice daily for infection  Drink plenty of fluids  If the culture does not grow anything, the 3 days of antibiotics will have been sufficient. If it grows the Proteus the transplant team may decide they wish for you to take it longer. If you do not hear from [place] by Tuesday regarding the culture call back     Return if symptoms worsen or fail  to improve.   HOPPER,DAVID, MD 11/28/2015

## 2015-11-28 NOTE — Patient Instructions (Signed)
Take the amoxicillin 875 mg 1 pill twice daily for infection  Drink plenty of fluids  If the culture does not grow anything, the 3 days of antibiotics will have been sufficient. If it grows the Proteus the transplant team may decide they wish for you to take it longer. If you do not hear from [place] by Tuesday regarding the culture call back

## 2015-11-29 LAB — URINE CULTURE
COLONY COUNT: NO GROWTH
Organism ID, Bacteria: NO GROWTH

## 2015-12-31 ENCOUNTER — Telehealth: Payer: Self-pay | Admitting: *Deleted

## 2015-12-31 NOTE — Telephone Encounter (Signed)
Called patient to offer flu vaccine, however, VM picked up.  Left message on patient's voice mail to return call. DUCATTE, LAURENZE L, RN   

## 2016-03-04 ENCOUNTER — Ambulatory Visit (INDEPENDENT_AMBULATORY_CARE_PROVIDER_SITE_OTHER): Payer: Medicare Other | Admitting: Osteopathic Medicine

## 2016-03-04 VITALS — BP 120/84 | HR 88 | Temp 98.3°F | Resp 16 | Ht 75.0 in | Wt 217.0 lb

## 2016-03-04 DIAGNOSIS — D849 Immunodeficiency, unspecified: Secondary | ICD-10-CM

## 2016-03-04 DIAGNOSIS — B9789 Other viral agents as the cause of diseases classified elsewhere: Principal | ICD-10-CM

## 2016-03-04 DIAGNOSIS — J069 Acute upper respiratory infection, unspecified: Secondary | ICD-10-CM | POA: Diagnosis not present

## 2016-03-04 DIAGNOSIS — D899 Disorder involving the immune mechanism, unspecified: Secondary | ICD-10-CM

## 2016-03-04 MED ORDER — AZITHROMYCIN 250 MG PO TABS: ORAL_TABLET | ORAL | Status: DC

## 2016-03-04 MED ORDER — IPRATROPIUM BROMIDE 0.03 % NA SOLN: 2.0000 | Freq: Three times a day (TID) | NASAL | Status: DC | PRN

## 2016-03-04 MED ORDER — GUAIFENESIN-CODEINE 100-10 MG/5ML PO SOLN: 5.0000 mL | Freq: Four times a day (QID) | ORAL | Status: DC | PRN

## 2016-03-04 NOTE — Progress Notes (Signed)
HPI: [name] is a 45 y.o. male who presents to [facility]  today for chief complaint of:  Chief Complaint  Patient presents with  . Cough    x 2 days  . Fatigue  . sinus headache  . Diarrhea    . Location: generalized  . Quality: coughing (nonproductive), fatigue, no fever  . Assoc signs/symptoms: see ROS . Duration: started few days ago . Modifying factors: has tried the following OTC/Rx medications: OTC cough medicine, nothing else tried other than plenty of hot tea  . Context:  Got wet in the rain earlier this week and been feeling a bit ill since then   Past medical, social and family history reviewed. Current medications and allergies reviewed.     Review of Systems: CONSTITUTIONAL: yes fever/chills HEAD/EYES/EARS/NOSE/THROAT: no headache, no vision change or hearing change, no sore throat CARDIAC: No chest pain/pressure/palpitations RESPIRATORY: yes cough, no shortness of breath GASTROINTESTINAL: yes nausea, no vomiting, no abdominal pain, yes diarrhea MUSCULOSKELETAL: no myalgia/arthralgia SKIN: no rash    Exam:  BP 120/84 mmHg  Pulse 88  Temp(Src) 98.3 F (36.8 C) (Oral)  Resp 16  Ht 6\' 3"  (1.905 m)  Wt 217 lb (98.431 kg)  BMI 27.12 kg/m2  SpO2 95% Constitutional: VSS, see above. General Appearance: alert, well-developed, well-nourished, NAD Eyes: Normal lids and conjunctive, non-icteric sclera Ears, Nose, Mouth, Throat: Normal external inspection ears/nares/mouth/lips/gums, unalbe to visualize due to cerumen TM, MMM;       posterior pharynx without erythema, without exudate Neck: No masses, trachea midline. normal lymph nodes Respiratory: Normal respiratory effort. No  wheeze/rhonchi/rales Cardiovascular: S1/S2 normal, no murmur/rub/gallop auscultated. RRR.  Labs reviewed: [facility], CBC shows mildly decreased white cells but this is at baseline for him, creatinine mildly above baseline.   ASSESSMENT/PLAN: Written  prescription given for Z-Pak to take if starting to feel worse and seek medical care if this happens. Otherwise treat as viral illness, ER/RTC precautions reviewed all questions answered. Immunocompromised due to history of kidney transplant.   Viral URI with cough - Plan: ipratropium (ATROVENT) 0.03 % nasal spray, guaiFENesin-codeine 100-10 MG/5ML syrup, azithromycin (ZITHROMAX Z-PAK) 250 MG tablet  Immunocompromised (HCC)    Return if symptoms worsen or fail to improve.

## 2016-03-04 NOTE — Patient Instructions (Addendum)
     IF you received an x-ray today, you will receive an invoice from [facility]. Please contact [facility] at [phone] with questions or concerns regarding your invoice.   IF you received labwork today, you will receive an invoice from [employer]. Please contact [facility] at [phone] with questions or concerns regarding your invoice.   Our billing staff will not be able to assist you with questions regarding bills from these companies.  You will be contacted with the lab results as soon as they are available. The fastest way to get your results is to activate your My Chart account. Instructions are located on the last page of this paperwork. If you have not heard from [place] regarding the results in 2 weeks, please contact this office.     Viral Infections A viral infection can be caused by different types of viruses.Most viral infections are not serious and resolve on their own. However, some infections may cause severe symptoms and may lead to further complications. SYMPTOMS Viruses can frequently cause:  Minor sore throat.  Aches and pains.  Headaches.  Runny nose.  Different types of rashes.  Watery eyes.  Tiredness.  Cough.  Loss of appetite.  Gastrointestinal, resulting in nausea, vomiting, and diarrhea. These symptoms do not respond to antibiotics because the infection is not caused by bacteria. However, you might catch a bacterial infection following the viral infection. This is sometimes called a "superinfection." Symptoms of such a bacterial infection may include:  Worsening sore throat with pus and difficulty swallowing.  Swollen neck glands.  Chills and a high or persistent fever.  Severe headache.  Tenderness over the sinuses.  Persistent overall ill feeling (malaise), muscle aches, and tiredness (fatigue).  Persistent cough.  Yellow, green, or brown mucus production with coughing. HOME CARE  INSTRUCTIONS   Only take over-the-counter or prescription medicines for pain, discomfort, diarrhea, or fever as directed by your caregiver.  Drink enough water and fluids to keep your urine clear or pale yellow. Sports drinks can provide valuable electrolytes, sugars, and hydration.  Get plenty of rest and maintain proper nutrition. Soups and broths with crackers or rice are fine. SEEK IMMEDIATE MEDICAL CARE IF:   You have severe headaches, shortness of breath, chest pain, neck pain, or an unusual rash.  You have uncontrolled vomiting, diarrhea, or you are unable to keep down fluids.  You or your child has an oral temperature above 102 F (38.9 C), not controlled by medicine.  Your baby is older than 3 months with a rectal temperature of 102 F (38.9 C) or higher.  Your baby is 68 months old or younger with a rectal temperature of 100.4 F (38 C) or higher. MAKE SURE YOU:   Understand these instructions.  Will watch your condition.  Will get help right away if you are not doing well or get worse.   This information is not intended to replace advice given to you by your health care provider. Make sure you discuss any questions you have with your health care provider.   Document Released: 08/03/2005 Document Revised: 01/16/2012 Document Reviewed: 04/01/2015 Elsevier Interactive Patient Education [employer].

## 2016-08-04 ENCOUNTER — Encounter: Payer: Self-pay | Admitting: Neurology

## 2016-08-04 ENCOUNTER — Ambulatory Visit (INDEPENDENT_AMBULATORY_CARE_PROVIDER_SITE_OTHER): Payer: Medicare Other | Admitting: Neurology

## 2016-08-04 VITALS — BP 117/70 | HR 67 | Ht 72.0 in | Wt 216.0 lb

## 2016-08-04 DIAGNOSIS — G629 Polyneuropathy, unspecified: Secondary | ICD-10-CM

## 2016-08-04 DIAGNOSIS — E519 Thiamine deficiency, unspecified: Secondary | ICD-10-CM

## 2016-08-04 DIAGNOSIS — E531 Pyridoxine deficiency: Secondary | ICD-10-CM | POA: Diagnosis not present

## 2016-08-04 DIAGNOSIS — R7309 Other abnormal glucose: Secondary | ICD-10-CM

## 2016-08-04 HISTORY — DX: Polyneuropathy, unspecified: G62.9

## 2016-08-04 NOTE — Patient Instructions (Signed)
Remember to drink plenty of fluid, eat healthy meals and do not skip any meals. Try to eat protein with a every meal and eat a healthy snack such as fruit or nuts in between meals. Try to keep a regular sleep-wake schedule and try to exercise daily, particularly in the form of walking, 20-30 minutes a day, if you can.   As far as diagnostic testing: Labs  I would like to see you back as needed, sooner if we need to. Please call us with any interim questions, concerns, problems, updates or refill requests.   Our phone number is [phone]. We also have an after hours call service for urgent matters and there is a physician on-call for urgent questions. For any emergencies you know to call 911 or go to the nearest emergency room

## 2016-08-04 NOTE — Progress Notes (Signed)
[facility]    Provider:  Dr Jaynee Eagles Referring Provider: Suella Broad MD Primary Care Physician:  Lockie Pares, MD  CC:  Numbness and tingling in the feet  HPI:  [name] is a 45 y.o. male here as a referral from Dr. Nelva Bush for bilateral foot numbness. PMHx NICM/chronic diastolic CHF (normalized EF), ESRD previously on HD, s/p renal transplant (failed after 7 years, with repeat transplant 10/2014), HTN, bipolar disorder/ADD, anemia, bipolar 2. He has numbness that started as a tingle a few months ago without inciting event. He saw his urologist and podiatrist. They increased his B6 supplement (he is on Isoniazid). They checked his B12. He has some back problems as well and unsure if this is the cause. It has gotten worse, now his feet are numb, mainly when he sits. He had some numbness in the legs after his transplant in the upper legs and it went away. Symptoms are symmetric in both feet. Worse with sitting.Better with standing and moving around.  Also has some tingling when laying down. No cramping in the feet. It can get painful if he continues to sit. Moving around helps. Denies diabetes. No tick bites. Father with autoimmune disease. No other modifying factors or associated symptoms.  Reviewed notes, labs and imaging from outside physicians, which showed:  TSH 0.775 WNL,   Review of Systems: Patient complains of symptoms per HPI as well as the following symptoms: Numbness, weakness, insomnia, joint pain, allergies, depression, anxiety . Pertinent negatives per HPI. All others negative.   Social History   Social History  . Marital status: Single    Spouse name: N/A  . Number of children: 0  . Years of education: 20+   Occupational History  . Chef- fulltime    Social History Main Topics  . Smoking status: Never Smoker  . Smokeless tobacco: Never Used  . Alcohol use No     Comment: Stopped drinking after Kidney transplant in December 2015  . Drug use: No    . Sexual activity: Yes   Other Topics Concern  . 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      Tobacco: none      Alcohol: none      Drugs: none      Exercise: cycling; 3 days per week.      Sexual activity: females only; no male encounters; Chlamydia age 73.  Total partner = 55.  Condoms.      Caffeine use: Soda sometimes    Family History  Problem Relation Age of Onset  . Arthritis Mother   . Hypertension Mother   . Diabetes Mother   . Depression Mother   . Arthritis Father   . Hypertension Father   . Diabetes Father   . Depression Father     Past Medical History:  Diagnosis Date  . ADHD (attention deficit hyperactivity disorder)   . Anemia   . Anxiety   . Arthritis   . Bipolar disorder ([place])   . Blood transfusion   . Blood transfusion without reported diagnosis   . Chronic diastolic CHF (congestive heart failure) ([place])   . Complication of anesthesia    Woke up intubated gets combative  afraid to be alone   . Depression   .  Erectile dysfunction   . ESRD on hemodialysis ([place]) 07/13/2011   Glomerulonephritis at age 53, started HD in 60.  Deceased donor renal transplant 1999 [facility] then kidney failed and went back on HD in 2006.  L forearm AVF . s/p repeat renal transplant 10/2014 [facility].  Marland Kitchen Hypertension   . Mild ascending aorta dilatation (HCC)   . NICM (nonischemic cardiomyopathy) ([place])   . Pericardial effusion    a. Mod by echo in 2014, similar to prior.  . Renal transplant, status post 11/25/2014   Pt with Glomerulonephritis at age 70. Deceased donor renal transplant 1999 right and left renal transplant 10/2014.  Followed by [facility] transplant team; nephrologist is Dr. Barbette Merino.     Past Surgical History:  Procedure Laterality Date  . ANGIOPLASTY    . CHOLECYSTECTOMY    . DIALYSIS  FISTULA CREATION    . KIDNEY TRANSPLANT  10/17/14  . KNEE SURGERY    . LEFT HEART CATHETERIZATION WITH CORONARY ANGIOGRAM N/A 10/01/2013   Procedure: LEFT HEART CATHETERIZATION WITH CORONARY ANGIOGRAM;  Surgeon: [name], MD;  Location: [facility] CATH LAB;  Service: Cardiovascular;  Laterality: N/A;  . MOUTH SURGERY    . NEPHRECTOMY TRANSPLANTED ORGAN      Current Outpatient Prescriptions  Medication Sig Dispense Refill  . amLODipine (NORVASC) 10 MG tablet Take 10 mg by mouth 2 (two) times daily before a meal.    . amphetamine-dextroamphetamine (ADDERALL) 10 MG tablet Take 1 tablet (10 mg total) by mouth 2 (two) times daily with a meal. 60 tablet 0  . ARIPiprazole (ABILIFY) 20 MG tablet Take 20 mg by mouth daily.    Marland Kitchen aspirin EC 81 MG tablet Take 81 mg by mouth daily.    . carvedilol (COREG) 25 MG tablet Take 25 mg by mouth 2 (two) times daily with a meal.    . clonazePAM (KLONOPIN) 1 MG tablet Take 0.3 mg by mouth daily.     . cloNIDine (CATAPRES) 0.3 MG tablet Take 0.15 mg by mouth 2 (two) times daily.     Marland Kitchen ethambutol (MYAMBUTOL) 400 MG tablet Take 1,600 mg by mouth daily.    Marland Kitchen HYDROcodone-acetaminophen (NORCO/VICODIN) 5-325 MG per tablet Take 1 tablet by mouth every 6 (six) hours as needed for moderate pain. 30 tablet 0  . isoniazid (NYDRAZID) 300 MG tablet Take 300 mg by mouth daily.    . mycophenolate (CELLCEPT) 200 MG/ML suspension Take 500 mg by mouth 2 (two) times daily.    . pantoprazole (PROTONIX) 40 MG tablet Take 40 mg by mouth 2 (two) times daily.     . predniSONE (STERAPRED UNI-PAK) 5 MG TABS tablet Take 10 mg by mouth daily.     Marland Kitchen pyridOXINE (B-6) 50 MG tablet Take 1 tablet by mouth daily.    . Rifabutin (MYCOBUTIN PO) Take 1 Dose by mouth daily.    . sodium bicarbonate 650 MG tablet Take 1,300 mg by mouth daily.     Marland Kitchen sulfamethoxazole-trimethoprim (BACTRIM,SEPTRA) 400-80 MG per tablet Take 1 tablet by mouth 3 (three) times a week.    . tacrolimus (PROGRAF) 5 MG capsule  Take 6 mg by mouth 2 (two) times daily.    . tadalafil (CIALIS) 20 MG tablet Take 20 mg by mouth daily as needed.     . vardenafil (LEVITRA) 20 MG tablet Take 20 mg by mouth daily as needed for erectile dysfunction.     No current facility-administered medications for this visit.     Allergies as of  08/04/2016 - Review Complete 08/04/2016  Allergen Reaction Noted  . Delacort [hydrocortisone base] Nausea Only 07/05/2011  . Benicar [olmesartan]  01/05/2013  . Food Itching 07/05/2011  . Hydralazine  01/05/2013  . Nitrates, organic Other (See Comments) 07/05/2011    Vitals: BP 117/70 (BP Location: Right Arm, Patient Position: Sitting, Cuff Size: Normal)   Pulse 67   Ht 6' (1.829 m)   Wt 216 lb (98 kg)   BMI 29.29 kg/m  Last Weight:  Wt Readings from Last 1 Encounters:  08/04/16 216 lb (98 kg)   Last Height:   Ht Readings from Last 1 Encounters:  08/04/16 6' (1.829 m)   Physical exam: Exam: Gen: NAD, conversant, well nourised, overweight                  CV: RRR, no MRG. No Carotid Bruits. No peripheral edema, warm, nontender Eyes: Conjunctivae clear without exudates or hemorrhage  Neuro: Detailed Neurologic Exam  Speech:    Speech is normal; fluent and spontaneous with normal comprehension.  Cognition:    The patient is oriented to person, place, and time;     recent and remote memory intact;     language fluent;     normal attention, concentration,     fund of knowledge Cranial Nerves:    The pupils are equal, round, and reactive to light. Attempted funduscopic exam could not visualize due to small pupils Visual fields are full to finger confrontation. Extraocular movements are intact. Trigeminal sensation is intact and the muscles of mastication are normal. The face is symmetric. The palate elevates in the midline. Hearing intact. Voice is normal. Shoulder shrug is normal. The tongue has normal motion without fasciculations.   Coordination:    Normal finger to nose  and heel to shin.   Gait:    Heel-toe and tandem gait are normal.   Motor Observation:    No asymmetry, no atrophy, and no involuntary movements noted. Tone:    Normal muscle tone.    Posture:    Posture is normal. normal erect    Strength:    Strength is V/V in the upper and lower limbs.      Sensation: intact to LT and pin prick distally, vibration 5 seconds distally, decreased temp to the ankles, intact proprioception     Reflex Exam:  DTR's:     Deep tendon reflexes in the upper and lower extremities are normal bilaterally.   Toes:    The toes are downgoing bilaterally.   Clonus:    Clonus is absent.       Assessment/Plan:  45 y.o. male here as a referral from Dr. Emmaline Life for bilateral foot numbness. Mildly decreased sensation distally.  PMHx NICM/chronic diastolic CHF (normalized EF), ESRD previously on HD, s/p renal transplant (failed after 7 years, with repeat transplant 10/2014), HTN, bipolar disorder/ADD, anemia, bipolar 2. He has numbness that started as a tingle a few months ago. Peripheral nerve disorders may be associated with chronic renal failure. Will order serum testing for several other causes of neuropathy. Follow up with pain specialist.  CC: Dr. Nelva Bush, Dr. Fredirick Lathe, [facility] [address] [place], [place] [address]  Phone [phone] Fax [phone]

## 2016-08-09 ENCOUNTER — Telehealth: Payer: Self-pay | Admitting: *Deleted

## 2016-08-09 ENCOUNTER — Other Ambulatory Visit: Payer: Self-pay | Admitting: Neurology

## 2016-08-09 DIAGNOSIS — Z8261 Family history of arthritis: Secondary | ICD-10-CM

## 2016-08-09 DIAGNOSIS — R768 Other specified abnormal immunological findings in serum: Secondary | ICD-10-CM

## 2016-08-09 LAB — RHEUMATOID FACTOR: Rhuematoid fact SerPl-aCnc: 17.9 IU/mL — ABNORMAL HIGH (ref 0.0–13.9)

## 2016-08-09 LAB — SJOGREN'S SYNDROME ANTIBODS(SSA + SSB): ENA SSA (RO) Ab: <0.2 AI (ref 0.0–0.9)

## 2016-08-09 LAB — VITAMIN B1: Thiamine: 89.4 nmol/L (ref 66.5–200.0)

## 2016-08-09 LAB — VITAMIN B6: Vitamin B6: 64.7 ug/L — ABNORMAL HIGH (ref 5.3–46.7)

## 2016-08-09 LAB — HEMOGLOBIN A1C
Est. average glucose Bld gHb Est-mCnc: 117 mg/dL
HEMOGLOBIN A1C: 5.7 % — AB (ref 4.8–5.6)

## 2016-08-09 LAB — ANA W/REFLEX: ANA: NEGATIVE

## 2016-08-09 NOTE — Telephone Encounter (Signed)
LVM for pt to call about results. Gave [facility] phone number.  

## 2016-08-09 NOTE — Telephone Encounter (Signed)
-----   Message from Melvenia Beam, MD sent at 08/09/2016  9:09 AM EDT ----- Patient's rheumatoid factor came back elevated however it was not very increased and may be a falso positive, we do see this a lot. I have 2 other lab tests I can order to see if this is a false positive or not please ask if patient is interested  thanks

## 2016-08-11 NOTE — Telephone Encounter (Signed)
Called and LVM for pt to call about lab results. Gave [facility] phone number.

## 2016-08-11 NOTE — Telephone Encounter (Signed)
-----   Message from Melvenia Beam, MD sent at 08/09/2016  9:09 AM EDT ----- Patient's rheumatoid factor came back elevated however it was not very increased and may be a falso positive, we do see this a lot. I have 2 other lab tests I can order to see if this is a false positive or not please ask if patient is interested  thanks

## 2016-08-16 NOTE — Telephone Encounter (Signed)
Dr Jaynee Eagles- patient agreeable to have further lab tests. Can you place those please?   Called and spoke to pt since no return call. He stated he did receive messages and he was going to call me today. I relayed lab results per Dr Jaynee Eagles note. He is agreeable to do two other tests to see if RF was a false positive. He may not be out to [place] area for a couple weeks. I explained he can stop by office any time to have labs completed. He does not need appt. He verbalized understanding.

## 2016-08-17 ENCOUNTER — Other Ambulatory Visit: Payer: Self-pay | Admitting: Neurology

## 2016-08-17 NOTE — Telephone Encounter (Signed)
Ordered. thanks

## 2016-10-11 ENCOUNTER — Telehealth: Payer: Self-pay | Admitting: Neurology

## 2016-10-11 DIAGNOSIS — G629 Polyneuropathy, unspecified: Secondary | ICD-10-CM

## 2016-10-11 NOTE — Telephone Encounter (Signed)
Dr Ahern- please advise 

## 2016-10-11 NOTE — Telephone Encounter (Signed)
Pt advised he has seen podiatrist Dr Melony Overly. He is wanting RX for shoes and she advised since Dr Cathren Laine dx him with neuropathy RX will need to come from her. He would like to pick this up if possible on Thursday. Does he need to be seen for this?

## 2016-10-11 NOTE — Telephone Encounter (Signed)
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 I can write a script for shoes, but can he find out what kind of shoes? Anything particular?

## 2016-10-11 NOTE — Telephone Encounter (Signed)
LVM for pt to call back with information as to what kind of shoes. Ok per AA,MD to write script. Gave [facility] phone number for call back.

## 2016-10-13 NOTE — Telephone Encounter (Addendum)
Called patient again since no return call. He stated he did not receive VM.  Advised per AA,MD she can write script. He stated he needed: DME order for shoe: ortho custom molded for dx of neuropathy. He needs this for work. HE is going to bring order to [employer]. Advised I will try and mail that today. He verbalized understanding.   He requested I mail to him instead. He will not be able to pick up in office.  He recently moved, I updated mailing address in chart.

## 2017-08-04 ENCOUNTER — Ambulatory Visit (HOSPITAL_COMMUNITY): Payer: Medicare Other | Attending: Pulmonary Disease

## 2017-08-04 DIAGNOSIS — I318 Other specified diseases of pericardium: Secondary | ICD-10-CM | POA: Insufficient documentation

## 2017-08-04 DIAGNOSIS — I509 Heart failure, unspecified: Secondary | ICD-10-CM | POA: Insufficient documentation

## 2017-08-04 DIAGNOSIS — I110 Hypertensive heart disease with heart failure: Secondary | ICD-10-CM | POA: Diagnosis not present

## 2017-08-07 ENCOUNTER — Telehealth: Payer: Self-pay | Admitting: Cardiovascular Disease

## 2017-08-07 NOTE — Telephone Encounter (Signed)
Will route to Medical Records.

## 2017-08-07 NOTE — Telephone Encounter (Signed)
°  New Prob  Pt is due to have penile prosthesis insertion surgery today at noon. Urology rep states they were told Echocardiogram results would be available for them to view in care everywhere prior to surgery. However, they are unable to view the final readings. Requesting a copy of results faxed over to them at [phone] prior to noon. Surgeon would like to review results before proceeding with surgery this afternoon.

## 2019-07-13 ENCOUNTER — Ambulatory Visit (HOSPITAL_COMMUNITY)
Admission: EM | Admit: 2019-07-13 | Discharge: 2019-07-13 | Disposition: A | Discharge status: Home / Self Care | Payer: Medicare Other

## 2019-07-13 ENCOUNTER — Encounter (HOSPITAL_COMMUNITY): Payer: Self-pay | Admitting: *Deleted

## 2019-07-13 ENCOUNTER — Other Ambulatory Visit: Payer: Self-pay

## 2019-07-13 DIAGNOSIS — N186 End stage renal disease: Secondary | ICD-10-CM | POA: Diagnosis not present

## 2019-07-13 DIAGNOSIS — Z992 Dependence on renal dialysis: Secondary | ICD-10-CM

## 2019-07-13 DIAGNOSIS — S0083XA Contusion of other part of head, initial encounter: Secondary | ICD-10-CM | POA: Diagnosis not present

## 2019-07-13 DIAGNOSIS — I120 Hypertensive chronic kidney disease with stage 5 chronic kidney disease or end stage renal disease: Secondary | ICD-10-CM | POA: Diagnosis not present

## 2019-07-13 HISTORY — DX: Dependence on renal dialysis: Z99.2

## 2019-07-13 MED ORDER — AMOXICILLIN 500 MG PO TABS: 500.0000 mg | ORAL_TABLET | Freq: Every day | ORAL | 0 refills | Status: AC

## 2019-07-13 MED ORDER — MAGIC MOUTHWASH W/LIDOCAINE: 5.0000 mL | Freq: Three times a day (TID) | ORAL | 0 refills | Status: DC | PRN

## 2019-07-13 NOTE — ED Provider Notes (Signed)
[place]    CSN: [id number] Arrival date & time: 07/13/19  1358      History   Chief Complaint Chief Complaint  Patient presents with  . Dental Problem    HPI [name] is a 48 y.o. male.   48 y.o. male presents with hematoma to inner aspect of left lower jaw X 1 day. Patient states that he woke up this morning and it was present. Patient state that he was seen at in an Urgent care in [place] who referred his to his local primary care. Patient states that the hematoma was larger "but it bleed out" decreasing the size and giving him relief from the pain.  hematoma is located at the jaw line by the left inner check.Patient denies any pain at this time and is phlegmounous in nature. Condition is acute in nature. Condition is made better by bleeding. Condition is made worse by nothing. Patient denies any treatment prior to there arrival at this facility. Patient denies any trauma to his mouth. Patient is ESRD on HD and states he has been getting a fever post dialysis on Friday's that self resolves within 1 hou.      Past Medical History:  Diagnosis Date  . ADHD (attention deficit hyperactivity disorder)   . Anemia   . Anxiety   . Arthritis   . Bipolar disorder ([place])   . Blood transfusion   . Blood transfusion without reported diagnosis   . Chronic diastolic CHF (congestive heart failure) ([place])   . Complication of anesthesia    Woke up intubated gets combative  afraid to be alone   . Depression   . Erectile dysfunction   . ESRD on hemodialysis ([place]) 07/13/2011   Glomerulonephritis at age 56, started HD in 66.  Deceased donor renal transplant 1999 at [facility], then kidney failed and went back on HD in 2006.  L forearm AVF . s/p repeat renal transplant 10/2014 [facility].  Marland Kitchen Hypertension   . Mild ascending aorta dilatation (HCC)   . NICM (nonischemic cardiomyopathy) ([place])   . Pericardial effusion    a. Mod by echo in 2014, similar to prior.  . Renal transplant, status post  11/25/2014   Pt with Glomerulonephritis at age 58. Deceased donor renal transplant 1999 right and left renal transplant 10/2014.  Followed by [facility] transplant team; nephrologist is Dr. Barbette Merino.     Patient Active Problem List   Diagnosis Date Noted  . Peripheral polyneuropathy 08/04/2016  . Pericardial effusion   . Renal transplant, status post 11/25/2014  . Hydrocele, right 11/25/2014  . Health care maintenance 09/10/2014  . Influenza-like illness 12/18/2013  . Bipolar 2 disorder ([place]) 08/18/2011  . ESRD on hemodialysis ([place]) 07/13/2011  . Congestive heart failure (CHF) ([place]) 07/13/2011  . Anemia, chronic disease 07/13/2011  . Hypertension 07/13/2011    Past Surgical History:  Procedure Laterality Date  . ANGIOPLASTY    . CHOLECYSTECTOMY    . DIALYSIS FISTULA CREATION    . KIDNEY TRANSPLANT  10/17/14  . KNEE SURGERY    . LEFT HEART CATHETERIZATION WITH CORONARY ANGIOGRAM N/A 10/01/2013   Procedure: LEFT HEART CATHETERIZATION WITH CORONARY ANGIOGRAM;  Surgeon: [name], MD;  Location: [facility] CATH LAB;  Service: Cardiovascular;  Laterality: N/A;  . MOUTH SURGERY    . NEPHRECTOMY TRANSPLANTED ORGAN         Home Medications    Prior to Admission medications   Medication Sig Start Date End Date Taking? Authorizing Provider  amLODipine (  NORVASC) 10 MG tablet Take 10 mg by mouth 2 (two) times daily before a meal.    [provider]  amphetamine-dextroamphetamine (ADDERALL) 10 MG tablet Take 1 tablet (10 mg total) by mouth 2 (two) times daily with a meal. 03/11/15   Wardell Honour, MD  ARIPiprazole (ABILIFY) 20 MG tablet Take 20 mg by mouth daily.    [provider]  aspirin EC 81 MG tablet Take 81 mg by mouth daily.    [provider]  carvedilol (COREG) 25 MG tablet Take 25 mg by mouth 2 (two) times daily with a meal.    [provider]  clonazePAM (KLONOPIN) 1 MG tablet Take 0.3 mg by mouth daily.     [provider]   cloNIDine (CATAPRES) 0.3 MG tablet Take 0.15 mg by mouth 2 (two) times daily.     [provider]  ethambutol (MYAMBUTOL) 400 MG tablet Take 1,600 mg by mouth daily. 05/02/16   [provider]  HYDROcodone-acetaminophen (NORCO/VICODIN) 5-325 MG per tablet Take 1 tablet by mouth every 6 (six) hours as needed for moderate pain. 02/05/15   Wardell Honour, MD  isoniazid (NYDRAZID) 300 MG tablet Take 300 mg by mouth daily. 07/01/16   [provider]  mycophenolate (CELLCEPT) 200 MG/ML suspension Take 500 mg by mouth 2 (two) times daily. 06/22/16   [provider]  pantoprazole (PROTONIX) 40 MG tablet Take 40 mg by mouth 2 (two) times daily.     [provider]  predniSONE (STERAPRED UNI-PAK) 5 MG TABS tablet Take 10 mg by mouth daily.     [provider]  pyridOXINE (B-6) 50 MG tablet Take 1 tablet by mouth daily. 02/25/16   [provider]  Rifabutin (MYCOBUTIN PO) Take 1 Dose by mouth daily.    [provider]  sodium bicarbonate 650 MG tablet Take 1,300 mg by mouth daily.     [provider]  sulfamethoxazole-trimethoprim (BACTRIM,SEPTRA) 400-80 MG per tablet Take 1 tablet by mouth 3 (three) times a week.    [provider]  tacrolimus (PROGRAF) 5 MG capsule Take 6 mg by mouth 2 (two) times daily.    [provider]  tadalafil (CIALIS) 20 MG tablet Take 20 mg by mouth daily as needed.     [provider]  vardenafil (LEVITRA) 20 MG tablet Take 20 mg by mouth daily as needed for erectile dysfunction.    [provider]    Family History Family History  Problem Relation Age of Onset  . Arthritis Mother   . Hypertension Mother   . Diabetes Mother   . Depression Mother   . Arthritis Father   . Hypertension Father   . Diabetes Father   . Depression Father     Social History Social History   Tobacco Use  . Smoking status: Never Smoker  . Smokeless tobacco: Never Used  Substance  Use Topics  . Alcohol use: No    Alcohol/week: 1.0 standard drinks    Types: 1 Standard drinks or equivalent per week    Comment: Stopped drinking after Kidney transplant in December 2015  . Drug use: No     Allergies   Delacort [hydrocortisone base]; Benicar [olmesartan]; Food; Hydralazine; and Nitrates, organic   Review of Systems Review of Systems  Constitutional: Negative for chills and fever.  HENT: Negative for ear pain and sore throat.        Blister in mouth  Eyes: Negative for pain and visual  disturbance.  Respiratory: Negative for cough and shortness of breath.   Cardiovascular: Negative for chest pain and palpitations.  Gastrointestinal: Negative for abdominal pain and vomiting.  Genitourinary: Negative for dysuria and hematuria.  Musculoskeletal: Negative for arthralgias and back pain.  Skin: Negative for color change and rash.  Neurological: Negative for seizures and syncope.  All other systems reviewed and are negative.    Physical Exam Triage Vital Signs ED Triage Vitals  Enc Vitals Group     BP 07/13/19 1425 (!) 148/89     Pulse Rate 07/13/19 1425 81     Resp 07/13/19 1425 18     Temp 07/13/19 1425 97.6 F (36.4 C)     Temp Source 07/13/19 1425 Other     SpO2 07/13/19 1425 99 %     Weight --      Height --      Head Circumference --      Peak Flow --      Pain Score 07/13/19 1426 0     Pain Loc --      Pain Edu? --      Excl. in [place]? --    No data found.  Updated Vital Signs BP (!) 148/89   Pulse 81   Temp 97.6 F (36.4 C) (Other (Comment))   Resp 18   SpO2 99%   Visual Acuity Right Eye Distance:   Left Eye Distance:   Bilateral Distance:    Right Eye Near:   Left Eye Near:    Bilateral Near:     Physical Exam Vitals signs and nursing note reviewed.  Constitutional:      Appearance: He is well-developed.  HENT:     Head: Normocephalic.     Mouth/Throat:     Comments: hematoma located at the jaw line of the left lower check  approximately 2 cm in length and phlegmonous.  Neck:     Musculoskeletal: Normal range of motion.  Pulmonary:     Effort: Pulmonary effort is normal.  Musculoskeletal: Normal range of motion.  Skin:    General: Skin is dry.  Neurological:     Mental Status: He is alert and oriented to person, place, and time.      UC Treatments / Results  Labs (all labs ordered are listed, but only abnormal results are displayed) Labs Reviewed - No data to display  EKG   Radiology No results found.  Procedures Procedures (including critical care time)  Medications Ordered in UC Medications - No data to display  Initial Impression / Assessment and Plan / UC Course  I have reviewed the triage vital signs and the nursing notes.  Pertinent labs & imaging results that were available during my care of the patient were reviewed by me and considered in my medical decision making (see chart for details).      Final Clinical Impressions(s) / UC Diagnoses   Final diagnoses:  None   Discharge Instructions   None    ED Prescriptions    None     Controlled Substance Prescriptions [place] Controlled Substance Registry consulted? Not Applicable   Jacqualine Mau, NP 07/13/19 1438

## 2019-07-13 NOTE — Discharge Instructions (Addendum)
Please follow up with your dental provider for further recommendation.

## 2019-07-13 NOTE — ED Triage Notes (Signed)
C/O "abscess" - noticed early this AM, "but then it bled out".  Denies any purulent drainage.  Went to an urgent care in [place], was told they didn't know what it was, and to get it seen where he normally goes.  States initially had some pain, but no pain since drainage.

## 2019-07-17 ENCOUNTER — Encounter (HOSPITAL_COMMUNITY): Payer: Self-pay

## 2019-07-17 ENCOUNTER — Other Ambulatory Visit: Payer: Self-pay

## 2019-07-17 ENCOUNTER — Ambulatory Visit (HOSPITAL_COMMUNITY)
Admission: EM | Admit: 2019-07-17 | Discharge: 2019-07-17 | Disposition: A | Discharge status: Home / Self Care | Payer: Medicare Other | Attending: Urgent Care | Admitting: Urgent Care

## 2019-07-17 DIAGNOSIS — J029 Acute pharyngitis, unspecified: Secondary | ICD-10-CM

## 2019-07-17 DIAGNOSIS — N185 Chronic kidney disease, stage 5: Secondary | ICD-10-CM

## 2019-07-17 MED ORDER — ALUMINUM-MAGNESIUM-SIMETHICONE 200-200-20 MG/5ML PO SUSP: 15.0000 mL | Freq: Two times a day (BID) | ORAL | 0 refills | Status: DC | PRN

## 2019-07-17 NOTE — ED Triage Notes (Addendum)
Pt presents with complaints of sore throat since Thursday. Denies any other symptoms. Reports he was tested for strep and it was negative. Pt refuses covid testing.

## 2019-07-17 NOTE — ED Provider Notes (Signed)
MRN: [id number] DOB: June 23, 1971  Subjective:   [name] is a 48 y.o. male presenting for recheck on persistent throat pain.  This will be the patient's second office visit at our clinic for the same.  Last office visit was on 07/13/2019, was started on amoxicillin as he also had hematoma orally.  Patient states that the throat pain persisted and feels like a burning sensation at the back of his throat.  He had a negative strep test.  Refuses COVID-19 testing.  Patient is on dialysis.  He has an ENT doctor but had to cancel his appointment as the consult was scheduled during his dialysis.  He plans on rescheduling.  No current facility-administered medications for this encounter.   Current Outpatient Medications:  .  amLODipine (NORVASC) 10 MG tablet, Take 10 mg by mouth 2 (two) times daily before a meal., Disp: , Rfl:  .  amoxicillin (AMOXIL) 500 MG tablet, Take 1 tablet (500 mg total) by mouth daily for 10 days., Disp: 10 tablet, Rfl: 0 .  amphetamine-dextroamphetamine (ADDERALL) 10 MG tablet, Take 1 tablet (10 mg total) by mouth 2 (two) times daily with a meal., Disp: 60 tablet, Rfl: 0 .  ARIPiprazole (ABILIFY) 20 MG tablet, Take 20 mg by mouth daily., Disp: , Rfl:  .  aspirin EC 81 MG tablet, Take 81 mg by mouth daily., Disp: , Rfl:  .  carvedilol (COREG) 25 MG tablet, Take 25 mg by mouth 2 (two) times daily with a meal., Disp: , Rfl:  .  Cinacalcet HCl (SENSIPAR PO), Take by mouth., Disp: , Rfl:  .  clonazePAM (KLONOPIN) 1 MG tablet, Take 0.3 mg by mouth daily. , Disp: , Rfl:  .  cloNIDine (CATAPRES) 0.3 MG tablet, Take 0.15 mg by mouth 2 (two) times daily. , Disp: , Rfl:  .  predniSONE (STERAPRED UNI-PAK) 5 MG TABS tablet, Take 10 mg by mouth daily. , Disp: , Rfl:  .  ethambutol (MYAMBUTOL) 400 MG tablet, Take 1,600 mg by mouth daily., Disp: , Rfl:  .  HYDROcodone-acetaminophen (NORCO/VICODIN) 5-325 MG per tablet, Take 1 tablet by mouth every 6 (six) hours as needed for moderate pain.,  Disp: 30 tablet, Rfl: 0 .  isoniazid (NYDRAZID) 300 MG tablet, Take 300 mg by mouth daily., Disp: , Rfl:  .  magic mouthwash w/lidocaine SOLN, Take 5 mLs by mouth 3 (three) times daily as needed for mouth pain., Disp: 120 mL, Rfl: 0 .  mycophenolate (CELLCEPT) 200 MG/ML suspension, Take 500 mg by mouth 2 (two) times daily., Disp: , Rfl:  .  pantoprazole (PROTONIX) 40 MG tablet, Take 40 mg by mouth 2 (two) times daily. , Disp: , Rfl:  .  pyridOXINE (B-6) 50 MG tablet, Take 1 tablet by mouth daily., Disp: , Rfl:  .  Rifabutin (MYCOBUTIN PO), Take 1 Dose by mouth daily., Disp: , Rfl:  .  Sevelamer Carbonate (RENVELA PO), Take by mouth., Disp: , Rfl:  .  sodium bicarbonate 650 MG tablet, Take 1,300 mg by mouth daily. , Disp: , Rfl:  .  sulfamethoxazole-trimethoprim (BACTRIM,SEPTRA) 400-80 MG per tablet, Take 1 tablet by mouth 3 (three) times a week., Disp: , Rfl:  .  tacrolimus (PROGRAF) 5 MG capsule, Take 6 mg by mouth 2 (two) times daily., Disp: , Rfl:  .  tadalafil (CIALIS) 20 MG tablet, Take 20 mg by mouth daily as needed. , Disp: , Rfl:  .  vardenafil (LEVITRA) 20 MG tablet, Take 20 mg by mouth daily as needed  for erectile dysfunction., Disp: , Rfl:    Allergies  Allergen Reactions  . Delacort [Hydrocortisone Base] Nausea Only  . Benicar [Olmesartan]     Unknown  . Food Itching    bananas  . Hydralazine     Head ache  . Nitrates, Organic Other (See Comments)    Palpitations, headache    Past Medical History:  Diagnosis Date  . ADHD (attention deficit hyperactivity disorder)   . Anemia   . Anxiety   . Arthritis   . Bipolar disorder ([place])   . Blood transfusion   . Blood transfusion without reported diagnosis   . Chronic diastolic CHF (congestive heart failure) ([place])   . Complication of anesthesia    Woke up intubated gets combative  afraid to be alone   . Depression   . Erectile dysfunction   . ESRD on hemodialysis ([place]) 07/13/2011   Glomerulonephritis at age 18, started HD in  78.  Deceased donor renal transplant 1999 at [facility], then kidney failed and went back on HD in 2006.  L forearm AVF . s/p repeat renal transplant 10/2014 [facility].  Marland Kitchen Hemodialysis status ([place])   . Hypertension   . Mild ascending aorta dilatation (HCC)   . NICM (nonischemic cardiomyopathy) ([place])   . Pericardial effusion    a. Mod by echo in 2014, similar to prior.  . Renal transplant, status post 11/25/2014   Pt with Glomerulonephritis at age 27. Deceased donor renal transplant 1999 right and left renal transplant 10/2014.  Followed by [facility] transplant team; nephrologist is Dr. Barbette Merino.      Past Surgical History:  Procedure Laterality Date  . ANGIOPLASTY    . CHOLECYSTECTOMY    . DIALYSIS FISTULA CREATION    . KIDNEY TRANSPLANT  10/17/14  . KNEE SURGERY    . LEFT HEART CATHETERIZATION WITH CORONARY ANGIOGRAM N/A 10/01/2013   Procedure: LEFT HEART CATHETERIZATION WITH CORONARY ANGIOGRAM;  Surgeon: [name], MD;  Location: [facility] CATH LAB;  Service: Cardiovascular;  Laterality: N/A;  . MOUTH SURGERY    . NEPHRECTOMY TRANSPLANTED ORGAN      ROS  Objective:   Vitals: BP (!) 166/95   Pulse 70   Temp 97.8 F (36.6 C)   Resp 19   SpO2 100%   Physical Exam Constitutional:      Appearance: Normal appearance. He is well-developed and normal weight.  HENT:     Head: Normocephalic and atraumatic.     Right Ear: External ear normal.     Left Ear: External ear normal.     Nose: Nose normal.     Mouth/Throat:     Mouth: No oral lesions.     Pharynx: Oropharynx is clear. No pharyngeal swelling, oropharyngeal exudate, posterior oropharyngeal erythema or uvula swelling.     Tonsils: No tonsillar exudate or tonsillar abscesses.  Eyes:     Extraocular Movements: Extraocular movements intact.     Pupils: Pupils are equal, round, and reactive to light.  Neck:     Musculoskeletal: Normal range of motion and neck supple. No neck rigidity or muscular tenderness.  Cardiovascular:      Rate and Rhythm: Normal rate.  Pulmonary:     Effort: Pulmonary effort is normal.  Lymphadenopathy:     Cervical: No cervical adenopathy.  Skin:    General: Skin is warm and dry.  Neurological:     Mental Status: He is alert and oriented to person, place, and time.  Psychiatric:  Mood and Affect: Mood normal.        Behavior: Behavior normal.     Assessment and Plan :   1. Sore throat   2. CKD (chronic kidney disease) stage 5, GFR less than 15 ml/min (HCC)     No evidence of pharyngitis, retropharyngeal abscess.  Will use supportive care.  Patient is to maintain amoxicillin, Magic mouthwash with lidocaine.  I will have patient try Maalox and avoid acidic foods.  He is to maintain his appointment with dialysis and reschedule with ENT. Counseled patient on potential for adverse effects with medications prescribed/recommended today, ER and return-to-clinic precautions discussed, patient verbalized understanding.    Jaynee Eagles, PA-C 07/17/19 1110

## 2019-10-18 ENCOUNTER — Telehealth: Payer: Self-pay

## 2019-10-18 NOTE — Telephone Encounter (Signed)
NOTES ON FILE FROM [facility] [id number], SENT REFERRAL TO SCHEDULING

## 2019-10-18 NOTE — Telephone Encounter (Signed)
REFERRAL ON FILE NOT NOTES

## 2019-12-04 NOTE — Progress Notes (Deleted)
CARDIOLOGY CONSULT NOTE       Patient ID: [name] MRN: [id number] DOB/AGE: [birth year] 49 y.o.  Admit date: (Not on file) Referring Physician: Tarry Kos Primary Physician: Danna Hefty, DO Primary Cardiologist: New Reason for Consultation: Chest Pain  Active Problems:   * No active hospital problems. *   HPI:  49 y.o. referred by Dr Tarry Kos for chest pain. Patient last seen in 2016 History of NICM/diastolic CHF. 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 HTN, Bipolar , ADD  Had no CAD at cath in 2009 and again in November 2014 prior to second transplant Currently not on dialysis Last echo in our system September 2018 reviewed EF 60-65% AV sclerosis /MAC with mild LAE   ***   ROS All other systems reviewed and negative except as noted above  Past Medical History:  Diagnosis Date  . ADHD (attention deficit hyperactivity disorder)   . Anemia   . Anxiety   . Arthritis   . Bipolar disorder ([place])   . Blood transfusion   . Blood transfusion without reported diagnosis   . Chronic diastolic CHF (congestive heart failure) ([place])   . Complication of anesthesia    Woke up intubated gets combative  afraid to be alone   . Depression   . Erectile dysfunction   . ESRD on hemodialysis ([place]) 07/13/2011   Glomerulonephritis at age 49, started HD in 26.  Deceased donor renal transplant 1999 at [facility], then kidney failed and went back on HD in 2006.  L forearm AVF . s/p repeat renal transplant 10/2014 [facility].  Marland Kitchen Hemodialysis status ([place])   . Hypertension   . Mild ascending aorta dilatation (HCC)   . NICM (nonischemic cardiomyopathy) ([place])   . Pericardial effusion    a. Mod by echo in 2014, similar to prior.  . Renal transplant, status post 11/25/2014   Pt with Glomerulonephritis at age 49. Deceased donor renal transplant 1999 right and left renal transplant 10/2014.  Followed by [facility] transplant team; nephrologist is Dr. Barbette Merino.     Family History  Problem Relation  Age of Onset  . Arthritis Mother   . Hypertension Mother   . Diabetes Mother   . Depression Mother   . Arthritis Father   . Hypertension Father   . Diabetes Father   . Depression Father     Social History   Socioeconomic History  . Marital status: Single    Spouse name: Not on file  . Number of children: 0  . Years of education: 91+  . Highest education level: Not on file  Occupational History  . Occupation: Chef- fulltime  Tobacco Use  . Smoking status: Former Smoker    Types: Cigars  . Smokeless tobacco: Never Used  Substance and Sexual Activity  . Alcohol use: No    Alcohol/week: 1.0 standard drinks    Types: 1 Standard drinks or equivalent per week    Comment: Stopped drinking after Kidney transplant in December 2015  . Drug use: No  . Sexual activity: Yes  Other Topics Concern  . 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      Tobacco: none      Alcohol: none      Drugs: none      Exercise:  cycling; 3 days per week.      Sexual activity: females only; no male encounters; Chlamydia age 30.  Total partner = 55.  Condoms.      Caffeine use: Soda sometimes   Social Determinants of Radio broadcast assistant Strain:   . Difficulty of Paying Living Expenses: Not on file  Food Insecurity:   . Worried About Charity fundraiser in the Last Year: Not on file  . Ran Out of Food in the Last Year: Not on file  Transportation Needs:   . Lack of Transportation (Medical): Not on file  . Lack of Transportation (Non-Medical): Not on file  Physical Activity:   . Days of Exercise per Week: Not on file  . Minutes of Exercise per Session: Not on file  Stress:   . Feeling of Stress : Not on file  Social Connections:   . Frequency of Communication with Friends and Family: Not on file  . Frequency of Social  Gatherings with Friends and Family: Not on file  . Attends Religious Services: Not on file  . Active Member of Clubs or Organizations: Not on file  . Attends Archivist Meetings: Not on file  . Marital Status: Not on file  Intimate Partner Violence:   . Fear of Current or Ex-Partner: Not on file  . Emotionally Abused: Not on file  . Physically Abused: Not on file  . Sexually Abused: Not on file    Past Surgical History:  Procedure Laterality Date  . ANGIOPLASTY    . CHOLECYSTECTOMY    . DIALYSIS FISTULA CREATION    . KIDNEY TRANSPLANT  10/17/14  . KNEE SURGERY    . LEFT HEART CATHETERIZATION WITH CORONARY ANGIOGRAM N/A 10/01/2013   Procedure: LEFT HEART CATHETERIZATION WITH CORONARY ANGIOGRAM;  Surgeon: [name], MD;  Location: [facility] CATH LAB;  Service: Cardiovascular;  Laterality: N/A;  . MOUTH SURGERY    . NEPHRECTOMY TRANSPLANTED ORGAN        Current Outpatient Medications:  .  aluminum-magnesium hydroxide-simethicone (MAALOX) 852-778-24 MG/5ML SUSP, Take 15 mLs by mouth 2 (two) times daily as needed., Disp: 300 mL, Rfl: 0 .  amLODipine (NORVASC) 10 MG tablet, Take 10 mg by mouth 2 (two) times daily before a meal., Disp: , Rfl:  .  amphetamine-dextroamphetamine (ADDERALL) 10 MG tablet, Take 1 tablet (10 mg total) by mouth 2 (two) times daily with a meal., Disp: 60 tablet, Rfl: 0 .  ARIPiprazole (ABILIFY) 20 MG tablet, Take 20 mg by mouth daily., Disp: , Rfl:  .  aspirin EC 81 MG tablet, Take 81 mg by mouth daily., Disp: , Rfl:  .  carvedilol (COREG) 25 MG tablet, Take 25 mg by mouth 2 (two) times daily with a meal., Disp: , Rfl:  .  Cinacalcet HCl (SENSIPAR PO), Take by mouth., Disp: , Rfl:  .  clonazePAM (KLONOPIN) 1 MG tablet, Take 0.3 mg by mouth daily. , Disp: , Rfl:  .  cloNIDine (CATAPRES) 0.3 MG tablet, Take 0.15 mg by mouth 2 (two) times daily. , Disp: , Rfl:  .  ethambutol (MYAMBUTOL) 400 MG tablet, Take 1,600 mg by mouth daily., Disp: , Rfl:  .   HYDROcodone-acetaminophen (NORCO/VICODIN) 5-325 MG per tablet, Take 1 tablet by mouth every 6 (six) hours as needed for moderate pain., Disp: 30 tablet, Rfl: 0 .  isoniazid (NYDRAZID) 300 MG tablet, Take 300 mg by mouth daily., Disp: , Rfl:  .  magic mouthwash w/lidocaine SOLN, Take 5  mLs by mouth 3 (three) times daily as needed for mouth pain., Disp: 120 mL, Rfl: 0 .  mycophenolate (CELLCEPT) 200 MG/ML suspension, Take 500 mg by mouth 2 (two) times daily., Disp: , Rfl:  .  pantoprazole (PROTONIX) 40 MG tablet, Take 40 mg by mouth 2 (two) times daily. , Disp: , Rfl:  .  predniSONE (STERAPRED UNI-PAK) 5 MG TABS tablet, Take 10 mg by mouth daily. , Disp: , Rfl:  .  pyridOXINE (B-6) 50 MG tablet, Take 1 tablet by mouth daily., Disp: , Rfl:  .  Rifabutin (MYCOBUTIN PO), Take 1 Dose by mouth daily., Disp: , Rfl:  .  Sevelamer Carbonate (RENVELA PO), Take by mouth., Disp: , Rfl:  .  sodium bicarbonate 650 MG tablet, Take 1,300 mg by mouth daily. , Disp: , Rfl:  .  sulfamethoxazole-trimethoprim (BACTRIM,SEPTRA) 400-80 MG per tablet, Take 1 tablet by mouth 3 (three) times a week., Disp: , Rfl:  .  tacrolimus (PROGRAF) 5 MG capsule, Take 6 mg by mouth 2 (two) times daily., Disp: , Rfl:  .  tadalafil (CIALIS) 20 MG tablet, Take 20 mg by mouth daily as needed. , Disp: , Rfl:  .  vardenafil (LEVITRA) 20 MG tablet, Take 20 mg by mouth daily as needed for erectile dysfunction., Disp: , Rfl:     Physical Exam: There were no vitals taken for this visit.    Affect appropriate Chronically ill black male  HEENT: normal Neck supple with no adenopathy JVP normal no bruits no thyromegaly Lungs clear with no wheezing and good diaphragmatic motion Heart:  S1/S2 no murmur, no rub, gallop or click PMI normal Abdomen: benighn, BS positve, no tenderness, no AAA no bruit.  Previous renal transplant x 2  Distal pulses intact with no bruits No edema Neuro non-focal Skin warm and dry Fistula LUE   Labs:   Lab  Results  Component Value Date   WBC 3.3 (L) 12/20/2013   HGB 11.8 (L) 12/20/2013   HCT 37.1 (L) 12/20/2013   MCV 100.3 (H) 12/20/2013   PLT 77 (L) 12/20/2013   No results for input(s): NA, K, CL, CO2, BUN, CREATININE, CALCIUM, PROT, BILITOT, ALKPHOS, ALT, AST, GLUCOSE in the last 168 hours.  Invalid input(s): LABALBU Lab Results  Component Value Date   ERDEYCX 448 (H) 12/18/2013   CKMB 3.4 11/02/2011   TROPONINI <0.30 11/02/2011    Lab Results  Component Value Date   CHOL  11/04/2007    83        ATP III CLASSIFICATION:  <200     mg/dL   Desirable  200-239  mg/dL   Borderline High  >=240    mg/dL   High   Lab Results  Component Value Date   HDL 20 (L) 11/04/2007   Lab Results  Component Value Date   [facility]  11/04/2007    51        Total Cholesterol/HDL:CHD Risk Coronary Heart Disease Risk Table                     Men   Women  1/2 Average Risk   3.4   3.3   Lab Results  Component Value Date   TRIG 60 11/04/2007   Lab Results  Component Value Date   CHOLHDL 4.2 11/04/2007   No results found for: LDLDIRECT    Radiology: No results found.  EKG: 2017 SR rate 70 T inversions 3,F with LAD and biphasic lateral T waves  ASSESSMENT AND PLAN:   1. Chest Pain. No previous obstructive CAD on two previous caths most recently 2015 *** 2. DCM:  History of in setting of poorly controlled BP will update echo 3. CRF:  Post renal transplant x 2 *** 4. Bipolar/ADD:  F/u with primary currently on Adderal, Abilify 5. HTN:  Renally based continue norvasc, coreg, clonidine f/u with primary   Signed: [name] 12/04/2019, 1:13 PM

## 2019-12-11 ENCOUNTER — Ambulatory Visit: Payer: Medicare Other | Admitting: Cardiovascular Disease

## 2020-01-08 ENCOUNTER — Ambulatory Visit: Payer: Medicare Other | Admitting: Cardiovascular Disease

## 2020-01-14 ENCOUNTER — Encounter: Payer: Self-pay | Admitting: Family Medicine

## 2020-03-15 ENCOUNTER — Other Ambulatory Visit: Payer: Self-pay

## 2020-03-15 ENCOUNTER — Encounter (HOSPITAL_COMMUNITY)
Admission: EM | Disposition: A | Discharge status: Home / Self Care | Payer: Self-pay | Source: Home / Self Care | Attending: Emergency Medicine

## 2020-03-15 ENCOUNTER — Inpatient Hospital Stay (HOSPITAL_COMMUNITY): Payer: Medicare Other | Admitting: Certified Registered Nurse Anesthetist

## 2020-03-15 ENCOUNTER — Emergency Department
Admission: EM | Admit: 2020-03-15 | Discharge: 2020-03-15 | Disposition: A | Discharge status: Home / Self Care | Payer: Medicare Other | Attending: Student | Admitting: Student

## 2020-03-15 ENCOUNTER — Observation Stay (HOSPITAL_COMMUNITY)
Admission: EM | Admit: 2020-03-15 | Discharge: 2020-03-16 | Disposition: A | Discharge status: Home / Self Care | Payer: Medicare Other | Attending: Internal Medicine | Admitting: Internal Medicine

## 2020-03-15 ENCOUNTER — Encounter (HOSPITAL_COMMUNITY): Payer: Self-pay

## 2020-03-15 DIAGNOSIS — I5032 Chronic diastolic (congestive) heart failure: Secondary | ICD-10-CM | POA: Diagnosis not present

## 2020-03-15 DIAGNOSIS — Z79899 Other long term (current) drug therapy: Secondary | ICD-10-CM | POA: Diagnosis not present

## 2020-03-15 DIAGNOSIS — D638 Anemia in other chronic diseases classified elsewhere: Secondary | ICD-10-CM | POA: Diagnosis not present

## 2020-03-15 DIAGNOSIS — I132 Hypertensive heart and chronic kidney disease with heart failure and with stage 5 chronic kidney disease, or end stage renal disease: Secondary | ICD-10-CM | POA: Insufficient documentation

## 2020-03-15 DIAGNOSIS — R58 Hemorrhage, not elsewhere classified: Secondary | ICD-10-CM

## 2020-03-15 DIAGNOSIS — F419 Anxiety disorder, unspecified: Secondary | ICD-10-CM | POA: Diagnosis not present

## 2020-03-15 DIAGNOSIS — E669 Obesity, unspecified: Secondary | ICD-10-CM | POA: Diagnosis not present

## 2020-03-15 DIAGNOSIS — Z992 Dependence on renal dialysis: Secondary | ICD-10-CM | POA: Insufficient documentation

## 2020-03-15 DIAGNOSIS — Z9889 Other specified postprocedural states: Secondary | ICD-10-CM | POA: Insufficient documentation

## 2020-03-15 DIAGNOSIS — F3181 Bipolar II disorder: Secondary | ICD-10-CM | POA: Insufficient documentation

## 2020-03-15 DIAGNOSIS — I959 Hypotension, unspecified: Secondary | ICD-10-CM | POA: Insufficient documentation

## 2020-03-15 DIAGNOSIS — Z8249 Family history of ischemic heart disease and other diseases of the circulatory system: Secondary | ICD-10-CM | POA: Diagnosis not present

## 2020-03-15 DIAGNOSIS — K625 Hemorrhage of anus and rectum: Principal | ICD-10-CM | POA: Diagnosis present

## 2020-03-15 DIAGNOSIS — E875 Hyperkalemia: Secondary | ICD-10-CM | POA: Insufficient documentation

## 2020-03-15 DIAGNOSIS — Z940 Kidney transplant status: Secondary | ICD-10-CM | POA: Diagnosis not present

## 2020-03-15 DIAGNOSIS — M1990 Unspecified osteoarthritis, unspecified site: Secondary | ICD-10-CM | POA: Insufficient documentation

## 2020-03-15 DIAGNOSIS — Z833 Family history of diabetes mellitus: Secondary | ICD-10-CM | POA: Insufficient documentation

## 2020-03-15 DIAGNOSIS — Z87891 Personal history of nicotine dependence: Secondary | ICD-10-CM | POA: Insufficient documentation

## 2020-03-15 DIAGNOSIS — I428 Other cardiomyopathies: Secondary | ICD-10-CM | POA: Diagnosis not present

## 2020-03-15 DIAGNOSIS — Z20822 Contact with and (suspected) exposure to covid-19: Secondary | ICD-10-CM | POA: Insufficient documentation

## 2020-03-15 DIAGNOSIS — I272 Pulmonary hypertension, unspecified: Secondary | ICD-10-CM | POA: Insufficient documentation

## 2020-03-15 DIAGNOSIS — N186 End stage renal disease: Secondary | ICD-10-CM | POA: Diagnosis not present

## 2020-03-15 DIAGNOSIS — Z7982 Long term (current) use of aspirin: Secondary | ICD-10-CM | POA: Diagnosis not present

## 2020-03-15 DIAGNOSIS — F909 Attention-deficit hyperactivity disorder, unspecified type: Secondary | ICD-10-CM | POA: Insufficient documentation

## 2020-03-15 DIAGNOSIS — E1122 Type 2 diabetes mellitus with diabetic chronic kidney disease: Secondary | ICD-10-CM | POA: Insufficient documentation

## 2020-03-15 HISTORY — PX: EVALUATION UNDER ANESTHESIA WITH HEMORRHOIDECTOMY: SHX5624

## 2020-03-15 LAB — BASIC METABOLIC PANEL
Anion gap: 9 (ref 5–15)
BUN: 59 mg/dL — ABNORMAL HIGH (ref 6–20)
CO2: 21 mmol/L — ABNORMAL LOW (ref 22–32)
Calcium: 8.4 mg/dL — ABNORMAL LOW (ref 8.9–10.3)
Chloride: 104 mmol/L (ref 98–111)
Creatinine, Ser: 17.13 mg/dL — ABNORMAL HIGH (ref 0.61–1.24)
GFR calc Af Amer: 3 mL/min — ABNORMAL LOW (ref >60)
GFR calc non Af Amer: 3 mL/min — ABNORMAL LOW (ref >60)
Glucose, Bld: 154 mg/dL — ABNORMAL HIGH (ref 70–99)
Potassium: 6.7 mmol/L (ref 3.5–5.1)
Sodium: 134 mmol/L — ABNORMAL LOW (ref 135–145)

## 2020-03-15 LAB — CBC WITH DIFFERENTIAL/PLATELET
Abs Immature Granulocytes: 0.01 10*3/uL (ref 0.00–0.07)
Basophils Absolute: 0 10*3/uL (ref 0.0–0.1)
Basophils Relative: 1 %
Eosinophils Absolute: 0.2 10*3/uL (ref 0.0–0.5)
Eosinophils Relative: 5 %
HCT: 37.9 % — ABNORMAL LOW (ref 39.0–52.0)
Hemoglobin: 11.6 g/dL — ABNORMAL LOW (ref 13.0–17.0)
Immature Granulocytes: 0 %
Lymphocytes Relative: 48 %
Lymphs Abs: 2.2 10*3/uL (ref 0.7–4.0)
MCH: 30.7 pg (ref 26.0–34.0)
MCHC: 30.6 g/dL (ref 30.0–36.0)
MCV: 100.3 fL — ABNORMAL HIGH (ref 80.0–100.0)
Monocytes Absolute: 0.5 10*3/uL (ref 0.1–1.0)
Monocytes Relative: 12 %
Neutro Abs: 1.5 10*3/uL — ABNORMAL LOW (ref 1.7–7.7)
Neutrophils Relative %: 34 %
Platelets: 157 10*3/uL (ref 150–400)
RBC: 3.78 MIL/uL — ABNORMAL LOW (ref 4.22–5.81)
RDW: 13.2 % (ref 11.5–15.5)
WBC: 4.5 10*3/uL (ref 4.0–10.5)
nRBC: 0 % (ref 0.0–0.2)

## 2020-03-15 LAB — POCT I-STAT, CHEM 8
BUN: 59 mg/dL — ABNORMAL HIGH (ref 6–20)
Calcium, Ion: 1 mmol/L — ABNORMAL LOW (ref 1.15–1.40)
Chloride: 103 mmol/L (ref 98–111)
Creatinine, Ser: 15.4 mg/dL — ABNORMAL HIGH (ref 0.61–1.24)
Glucose, Bld: 117 mg/dL — ABNORMAL HIGH (ref 70–99)
HCT: 26 % — ABNORMAL LOW (ref 39.0–52.0)
Hemoglobin: 8.8 g/dL — ABNORMAL LOW (ref 13.0–17.0)
Potassium: 6 mmol/L — ABNORMAL HIGH (ref 3.5–5.1)
Sodium: 144 mmol/L (ref 135–145)
TCO2: 25 mmol/L (ref 22–32)

## 2020-03-15 LAB — CBC
HCT: 29.3 % — ABNORMAL LOW (ref 39.0–52.0)
Hemoglobin: 9.1 g/dL — ABNORMAL LOW (ref 13.0–17.0)
MCH: 30.5 pg (ref 26.0–34.0)
MCHC: 31.1 g/dL (ref 30.0–36.0)
MCV: 98.3 fL (ref 80.0–100.0)
Platelets: 123 10*3/uL — ABNORMAL LOW (ref 150–400)
RBC: 2.98 MIL/uL — ABNORMAL LOW (ref 4.22–5.81)
RDW: 13.9 % (ref 11.5–15.5)
WBC: 6.3 10*3/uL (ref 4.0–10.5)
nRBC: 0.3 % — ABNORMAL HIGH (ref 0.0–0.2)

## 2020-03-15 LAB — RENAL FUNCTION PANEL
Albumin: 2.9 g/dL — ABNORMAL LOW (ref 3.5–5.0)
Anion gap: 10 (ref 5–15)
BUN: 54 mg/dL — ABNORMAL HIGH (ref 6–20)
CO2: 23 mmol/L (ref 22–32)
Calcium: 8.5 mg/dL — ABNORMAL LOW (ref 8.9–10.3)
Chloride: 106 mmol/L (ref 98–111)
Creatinine, Ser: 16.21 mg/dL — ABNORMAL HIGH (ref 0.61–1.24)
GFR calc Af Amer: 4 mL/min — ABNORMAL LOW (ref >60)
GFR calc non Af Amer: 3 mL/min — ABNORMAL LOW (ref >60)
Glucose, Bld: 130 mg/dL — ABNORMAL HIGH (ref 70–99)
Phosphorus: 6.3 mg/dL — ABNORMAL HIGH (ref 2.5–4.6)
Potassium: 6.3 mmol/L (ref 3.5–5.1)
Sodium: 139 mmol/L (ref 135–145)

## 2020-03-15 LAB — PROTIME-INR
INR: 1.2 (ref 0.8–1.2)
Prothrombin Time: 14.5 seconds (ref 11.4–15.2)

## 2020-03-15 LAB — RESPIRATORY PANEL BY RT PCR (FLU A&B, COVID)
Influenza A by PCR: NEGATIVE
Influenza B by PCR: NEGATIVE
SARS Coronavirus 2 by RT PCR: NEGATIVE

## 2020-03-15 LAB — APTT: aPTT: 34 seconds (ref 24–36)

## 2020-03-15 SURGERY — EXAM UNDER ANESTHESIA WITH HEMORRHOIDECTOMY
Anesthesia: General | Site: Rectum

## 2020-03-15 MED ORDER — PHENYLEPHRINE HCL-NACL 10-0.9 MG/250ML-% IV SOLN
INTRAVENOUS | Status: DC | PRN
Start: 2020-03-15 — End: 2020-03-15
  Administered 2020-03-15: 25 ug/min via INTRAVENOUS

## 2020-03-15 MED ORDER — FENTANYL CITRATE (PF) 100 MCG/2ML IJ SOLN
INTRAMUSCULAR | Status: AC
  Filled 2020-03-15: qty 2

## 2020-03-15 MED ORDER — HYDROCODONE-ACETAMINOPHEN 5-325 MG PO TABS
1.0000 | ORAL_TABLET | Freq: Four times a day (QID) | ORAL | Status: DC | PRN
  Administered 2020-03-15 – 2020-03-16 (×3): 1 via ORAL
  Filled 2020-03-15 (×2): qty 1

## 2020-03-15 MED ORDER — SODIUM CHLORIDE 0.9 % IV SOLN
INTRAVENOUS | Status: DC | PRN
Start: 2020-03-15 — End: 2020-03-15

## 2020-03-15 MED ORDER — ACYCLOVIR 200 MG PO CAPS: 200.0000 mg | ORAL_CAPSULE | ORAL | Status: DC

## 2020-03-15 MED ORDER — OXYCODONE HCL 5 MG PO TABS: 5.0000 mg | ORAL_TABLET | Freq: Once | ORAL | Status: DC | PRN

## 2020-03-15 MED ORDER — ONDANSETRON HCL 4 MG/2ML IJ SOLN
INTRAMUSCULAR | Status: DC | PRN
  Administered 2020-03-15: 4 mg via INTRAVENOUS

## 2020-03-15 MED ORDER — ARIPIPRAZOLE 5 MG PO TABS
20.0000 mg | ORAL_TABLET | Freq: Every day | ORAL | Status: DC
  Administered 2020-03-15: 20 mg via ORAL
  Filled 2020-03-15: qty 4
  Filled 2020-03-15: qty 2

## 2020-03-15 MED ORDER — MIDAZOLAM HCL 2 MG/2ML IJ SOLN
INTRAMUSCULAR | Status: AC
  Filled 2020-03-15: qty 2

## 2020-03-15 MED ORDER — ALBUMIN HUMAN 5 % IV SOLN
INTRAVENOUS | Status: DC | PRN
Start: 2020-03-15 — End: 2020-03-15

## 2020-03-15 MED ORDER — CALCIUM GLUCONATE-NACL 1-0.675 GM/50ML-% IV SOLN
1.0000 g | Freq: Once | INTRAVENOUS | Status: AC
  Administered 2020-03-15: 1000 mg via INTRAVENOUS
  Filled 2020-03-15: qty 50

## 2020-03-15 MED ORDER — CALCIUM GLUCONATE 10 % IV SOLN: 1.0000 g | Freq: Once | INTRAVENOUS | Status: DC

## 2020-03-15 MED ORDER — CEFDINIR 300 MG PO CAPS
300.0000 mg | ORAL_CAPSULE | Freq: Every day | ORAL | Status: DC
  Filled 2020-03-15: qty 1

## 2020-03-15 MED ORDER — MIDAZOLAM HCL 5 MG/5ML IJ SOLN
INTRAMUSCULAR | Status: DC | PRN
  Administered 2020-03-15: 2 mg via INTRAVENOUS

## 2020-03-15 MED ORDER — OXYCODONE HCL 5 MG/5ML PO SOLN: 5.0000 mg | Freq: Once | ORAL | Status: DC | PRN

## 2020-03-15 MED ORDER — FENTANYL CITRATE (PF) 100 MCG/2ML IJ SOLN
25.0000 ug | INTRAMUSCULAR | Status: DC | PRN
  Administered 2020-03-15: 50 ug via INTRAVENOUS
  Administered 2020-03-15: 25 ug via INTRAVENOUS
  Administered 2020-03-15: 50 ug via INTRAVENOUS
  Administered 2020-03-15: 25 ug via INTRAVENOUS

## 2020-03-15 MED ORDER — 0.9 % SODIUM CHLORIDE (POUR BTL) OPTIME
TOPICAL | Status: DC | PRN
  Administered 2020-03-15: 13:00:00 1000 mL

## 2020-03-15 MED ORDER — LIDOCAINE-EPINEPHRINE (PF) 2 %-1:200000 IJ SOLN
20.0000 mL | Freq: Once | INTRAMUSCULAR | Status: AC
  Administered 2020-03-15: 20 mL
  Filled 2020-03-15: qty 20

## 2020-03-15 MED ORDER — ACYCLOVIR 200 MG PO CAPS
200.0000 mg | ORAL_CAPSULE | Freq: Two times a day (BID) | ORAL | Status: DC
  Administered 2020-03-15 – 2020-03-16 (×2): 200 mg via ORAL
  Filled 2020-03-15 (×3): qty 1

## 2020-03-15 MED ORDER — ARIPIPRAZOLE 5 MG PO TABS
10.0000 mg | ORAL_TABLET | Freq: Every day | ORAL | Status: DC
  Administered 2020-03-16: 10 mg via ORAL
  Filled 2020-03-15: qty 2

## 2020-03-15 MED ORDER — BUPIVACAINE HCL (PF) 0.25 % IJ SOLN
INTRAMUSCULAR | Status: AC
  Filled 2020-03-15: qty 30

## 2020-03-15 MED ORDER — CEFAZOLIN SODIUM-DEXTROSE 2-4 GM/100ML-% IV SOLN: 2.0000 g | INTRAVENOUS | Status: AC

## 2020-03-15 MED ORDER — SUCCINYLCHOLINE CHLORIDE 20 MG/ML IJ SOLN
INTRAMUSCULAR | Status: DC | PRN
  Administered 2020-03-15: 120 mg via INTRAVENOUS

## 2020-03-15 MED ORDER — SODIUM CHLORIDE 0.9 % IV BOLUS: 500.0000 mL | Freq: Once | INTRAVENOUS | Status: DC

## 2020-03-15 MED ORDER — ONDANSETRON HCL 4 MG/2ML IJ SOLN: 4.0000 mg | Freq: Once | INTRAMUSCULAR | Status: DC | PRN

## 2020-03-15 MED ORDER — FENTANYL CITRATE (PF) 100 MCG/2ML IJ SOLN
INTRAMUSCULAR | Status: DC | PRN
  Administered 2020-03-15 (×4): 50 ug via INTRAVENOUS

## 2020-03-15 MED ORDER — FENTANYL CITRATE (PF) 250 MCG/5ML IJ SOLN
INTRAMUSCULAR | Status: AC
Start: 2020-03-15 — End: ?
  Filled 2020-03-15: qty 5

## 2020-03-15 MED ORDER — HYDROCODONE-ACETAMINOPHEN 5-325 MG PO TABS
ORAL_TABLET | ORAL | Status: AC
  Filled 2020-03-15: qty 1

## 2020-03-15 MED ORDER — TRANEXAMIC ACID FOR EPISTAXIS
500.0000 mg | Freq: Once | TOPICAL | Status: AC
  Administered 2020-03-15: 500 mg via TOPICAL
  Filled 2020-03-15: qty 10

## 2020-03-15 MED ORDER — ETOMIDATE 2 MG/ML IV SOLN
INTRAVENOUS | Status: DC | PRN
  Administered 2020-03-15: 16 mg via INTRAVENOUS

## 2020-03-15 MED ORDER — DEXAMETHASONE SODIUM PHOSPHATE 4 MG/ML IJ SOLN
INTRAMUSCULAR | Status: DC | PRN
  Administered 2020-03-15: 5 mg via INTRAVENOUS

## 2020-03-15 MED ORDER — CLONAZEPAM 1 MG PO TABS: 1.0000 mg | ORAL_TABLET | Freq: Every day | ORAL | Status: DC | PRN

## 2020-03-15 MED ORDER — PATIROMER SORBITEX CALCIUM 8.4 G PO PACK
8.4000 g | PACK | Freq: Once | ORAL | Status: AC
  Administered 2020-03-16: 8.4 g via ORAL
  Filled 2020-03-15 (×2): qty 1

## 2020-03-15 MED ORDER — CEFDINIR 300 MG PO CAPS
300.0000 mg | ORAL_CAPSULE | ORAL | Status: DC
  Administered 2020-03-16: 300 mg via ORAL
  Filled 2020-03-15 (×2): qty 1

## 2020-03-15 MED ORDER — ALBUTEROL SULFATE (2.5 MG/3ML) 0.083% IN NEBU
10.0000 mg | INHALATION_SOLUTION | Freq: Once | RESPIRATORY_TRACT | Status: AC
  Administered 2020-03-15: 10 mg via RESPIRATORY_TRACT
  Filled 2020-03-15: qty 12

## 2020-03-15 MED ORDER — HYDROMORPHONE HCL 1 MG/ML IJ SOLN
0.5000 mg | INTRAMUSCULAR | Status: DC | PRN
  Administered 2020-03-15: 1 mg via INTRAVENOUS
  Filled 2020-03-15: qty 1

## 2020-03-15 MED ORDER — CLONIDINE HCL 0.3 MG PO TABS
0.1500 mg | ORAL_TABLET | Freq: Two times a day (BID) | ORAL | Status: DC
  Administered 2020-03-15 – 2020-03-16 (×2): 0.15 mg via ORAL
  Filled 2020-03-15 (×2): qty 2

## 2020-03-15 MED ORDER — CALCITRIOL 0.25 MCG PO CAPS
0.2500 ug | ORAL_CAPSULE | Freq: Every day | ORAL | Status: DC
  Filled 2020-03-15 (×3): qty 1

## 2020-03-15 MED ORDER — HEMOSTATIC AGENTS (NO CHARGE) OPTIME
TOPICAL | Status: DC | PRN
  Administered 2020-03-15: 1

## 2020-03-15 MED ORDER — SODIUM CHLORIDE 0.9% IV SOLUTION: Freq: Once | INTRAVENOUS | Status: DC

## 2020-03-15 SURGICAL SUPPLY — 34 items
BLADE SURG 15 STRL LF DISP TIS (BLADE) ×1 IMPLANT
BLADE SURG 15 STRL SS (BLADE) ×3
BRIEF STRETCH FOR OB PAD LRG (UNDERPADS AND DIAPERS) ×1 IMPLANT
CANISTER SUCT 3000ML PPV (MISCELLANEOUS) ×3 IMPLANT
COVER SURGICAL LIGHT HANDLE (MISCELLANEOUS) ×3 IMPLANT
DRAPE HALF SHEET 40X57 (DRAPES) ×3 IMPLANT
DRSG PAD ABDOMINAL 8X10 ST (GAUZE/BANDAGES/DRESSINGS) ×1 IMPLANT
ELECT REM PT RETURN 9FT ADLT (ELECTROSURGICAL) ×3
ELECTRODE REM PT RTRN 9FT ADLT (ELECTROSURGICAL) ×1 IMPLANT
GAUZE 4X4 16PLY RFD (DISPOSABLE) ×3 IMPLANT
GAUZE SPONGE 4X4 12PLY STRL (GAUZE/BANDAGES/DRESSINGS) ×3 IMPLANT
GLOVE SURG SIGNA 7.5 PF LTX (GLOVE) ×3 IMPLANT
GOWN STRL REUS W/ TWL LRG LVL3 (GOWN DISPOSABLE) ×1 IMPLANT
GOWN STRL REUS W/ TWL XL LVL3 (GOWN DISPOSABLE) ×1 IMPLANT
GOWN STRL REUS W/TWL LRG LVL3 (GOWN DISPOSABLE) ×3
GOWN STRL REUS W/TWL XL LVL3 (GOWN DISPOSABLE) ×3
KIT BASIN OR (CUSTOM PROCEDURE TRAY) ×3 IMPLANT
KIT TURNOVER KIT B (KITS) ×3 IMPLANT
NS IRRIG 1000ML POUR BTL (IV SOLUTION) ×3 IMPLANT
PACK LITHOTOMY IV (CUSTOM PROCEDURE TRAY) ×3 IMPLANT
PAD ABD 8X10 STRL (GAUZE/BANDAGES/DRESSINGS) ×2 IMPLANT
PAD ARMBOARD 7.5X6 YLW CONV (MISCELLANEOUS) ×5 IMPLANT
PENCIL BUTTON HOLSTER BLD 10FT (ELECTRODE) ×3 IMPLANT
SURGILUBE 2OZ TUBE FLIPTOP (MISCELLANEOUS) ×3 IMPLANT
SUT CHROMIC 2 0 SH (SUTURE) ×3 IMPLANT
SUT VIC AB 2-0 SH 27 (SUTURE) ×9
SUT VIC AB 2-0 SH 27XBRD (SUTURE) IMPLANT
SYR BULB EAR ULCER 3OZ GRN STR (SYRINGE) ×3 IMPLANT
TOWEL GREEN STERILE (TOWEL DISPOSABLE) ×3 IMPLANT
TOWEL GREEN STERILE FF (TOWEL DISPOSABLE) ×3 IMPLANT
TUBE CONNECTING 12'X1/4 (SUCTIONS) ×1
TUBE CONNECTING 12X1/4 (SUCTIONS) ×2 IMPLANT
UNDERPAD 30X36 HEAVY ABSORB (UNDERPADS AND DIAPERS) ×7 IMPLANT
YANKAUER SUCT BULB TIP NO VENT (SUCTIONS) ×3 IMPLANT

## 2020-03-15 NOTE — Progress Notes (Signed)
RN spoke with on call MD and was told that albuterol was being given to help lower pt's potassium. RN called and informed respiratory and she stated she would be back up to give medicine.   Eleanora Neighbor, RN

## 2020-03-15 NOTE — ED Notes (Signed)
Pt reports he had the same thing happen in January after his surgery. Pt reports that the wound was packed with hemolytic gauze, and that worked for him.  Pt reports he has repacked the wound himself to slow down the bleeding. Pt reports estimated blood loss as "one cup"  Pt continues to decline bloodwork, stating he is on dialysis and doesn't want to use up his veins unless necessary

## 2020-03-15 NOTE — ED Notes (Signed)
Pt declines blood work at this time.

## 2020-03-15 NOTE — ED Provider Notes (Signed)
Emergency Department Provider Note   I have reviewed the triage vital signs and the nursing notes.   HISTORY  Chief Complaint Rectal Bleeding   HPI [name] is a 49 y.o. male with PMH of CHF, ESRD, and anemia presents to the emergency department with rectal bleeding in the setting of recent condylomata removal.  Patient had both internal and external lesions removed at [facility] last week.  He returned to work last night and an overnight felt a warm wet substance in his underwear.  He went to check and saw that he was bleeding.  He had a similar surgery and bleeding in January of last year which required packing. He initially went to [facility] ED for evaluation and states that shortly after leaving his bleeding returned. He denies any SOB or near syncope symptoms. No fever. He does note some fatigue and agrees with CBC check here.   Past Medical History:  Diagnosis Date  . ADHD (attention deficit hyperactivity disorder)   . Anemia   . Anemia, chronic disease 07/13/2011  . Anxiety   . Arthritis   . Bipolar 2 disorder ([place]) 08/18/2011   Diagnosed in 2011.   . Bipolar disorder ([place])   . Blood transfusion   . Blood transfusion without reported diagnosis   . Chronic diastolic CHF (congestive heart failure) ([place])   . Complication of anesthesia    Woke up intubated gets combative  afraid to be alone   . Congestive heart failure (CHF) ([place]) 07/13/2011   Onset 2006.  Followed by Dr. Johnsie Cancel.  S/p cardiac catheterization in [facility] Dr. Marlou Porch..  S/p cardiac catheterization in 2014 by Nishan.  Echo 2014.   . Depression   . Erectile dysfunction   . ESRD on hemodialysis ([place]) 07/13/2011   Glomerulonephritis at age 69, started HD in 34.  Deceased donor renal transplant 1999 at [facility], then kidney failed and went back on HD in 2006.  L forearm AVF . s/p repeat renal transplant 10/2014 [facility].  . GI bleed 11/01/2011   Rectal bleeding with emesis/diarrhea   . Hemodialysis status ([place])   .  Hydrocele, right 11/25/2014  . Hypertension   . Hypertensive emergency 01/06/2013  . Hypoglycemia 01/11/2013  . Influenza-like illness 12/18/2013  . Mild ascending aorta dilatation (HCC)   . NICM (nonischemic cardiomyopathy) ([place])   . Pericardial effusion    a. Mod by echo in 2014, similar to prior.  . Peripheral polyneuropathy 08/04/2016  . Pulmonary edema 01/06/2013  . Pulmonary hypertension associated with end stage renal disease on dialysis ([place]) 07/13/2011  . Renal transplant, status post 11/25/2014   Pt with Glomerulonephritis at age 54. Deceased donor renal transplant 1999 right and left renal transplant 10/2014.  Followed by [facility] transplant team; nephrologist is Dr. Barbette Merino.   . Respiratory failure with hypoxia ([place]) 01/06/2013  . Shortness of breath 12/12/2012  . Swollen testicle 07/13/2011  . URI (upper respiratory infection) 01/25/2012    Patient Active Problem List   Diagnosis Date Noted  . Rectal bleed 03/15/2020  . Peripheral polyneuropathy 08/04/2016  . Pericardial effusion   . Renal transplant, status post 11/25/2014  . Hydrocele, right 11/25/2014  . Health care maintenance 09/10/2014  . Influenza-like illness 12/18/2013  . Bipolar 2 disorder ([place]) 08/18/2011  . ESRD on hemodialysis ([place]) 07/13/2011  . Congestive heart failure (CHF) ( [place]) 07/13/2011  . Anemia, chronic disease 07/13/2011  . Hypertension 07/13/2011    Past Surgical History:  Procedure Laterality Date  . ANGIOPLASTY    .  CHOLECYSTECTOMY    . DIALYSIS FISTULA CREATION    . KIDNEY TRANSPLANT  10/17/14  . KNEE SURGERY    . LEFT HEART CATHETERIZATION WITH CORONARY ANGIOGRAM N/A 10/01/2013   Procedure: LEFT HEART CATHETERIZATION WITH CORONARY ANGIOGRAM;  Surgeon: [name], MD;  Location: [facility] CATH LAB;  Service: Cardiovascular;  Laterality: N/A;  . MOUTH SURGERY    . NEPHRECTOMY TRANSPLANTED ORGAN      Allergies Delacort [hydrocortisone base]; Benicar [olmesartan]; Food; Hydralazine; and  Nitrates, organic  Family History  Problem Relation Age of Onset  . Arthritis Mother   . Hypertension Mother   . Diabetes Mother   . Depression Mother   . Arthritis Father   . Hypertension Father   . Diabetes Father   . Depression Father     Social History Social History   Tobacco Use  . Smoking status: Former Smoker    Types: Cigars  . Smokeless tobacco: Never Used  Substance Use Topics  . Alcohol use: No    Alcohol/week: 1.0 standard drinks    Types: 1 Standard drinks or equivalent per week    Comment: Stopped drinking after Kidney transplant in December 2015  . Drug use: No    Review of Systems  Constitutional: No fever/chills Cardiovascular: Denies chest pain. Respiratory: Denies shortness of breath. Gastrointestinal: No abdominal pain.  No nausea, no vomiting.  No diarrhea.  No constipation. Positive rectal bleeding.  Genitourinary: Negative for dysuria. Musculoskeletal: Negative for back pain. Skin: Negative for rash.  Neurological: Negative for headaches.  10-point ROS otherwise negative.  ____________________________________________   PHYSICAL EXAM:  VITAL SIGNS: Vitals:   03/15/20 1125 03/15/20 1127  BP: 105/78 110/84  Pulse:    Resp:  (!) 26  Temp:    SpO2:      Constitutional: Alert and oriented. Well appearing and in no acute distress. Eyes: Conjunctivae are normal. Head: Atraumatic. Nose: No congestion/rhinnorhea. Mouth/Throat: Mucous membranes are moist.  Neck: No stridor.  Cardiovascular: Normal rate, regular rhythm. Good peripheral circulation. Grossly normal heart sounds.   Respiratory: Normal respiratory effort.  No retractions. Lungs CTAB. Gastrointestinal: Soft and nontender. No distention.  Rectal exam performed with nurse chaperone.  Patient with brisk venous oozing from the rectal and perirectal areas. No masses or surrounding cellulitis. No artierial bleeding.  Musculoskeletal: No lower extremity tenderness nor edema. No gross  deformities of extremities. Neurologic:  Normal speech and language. Skin:  Skin is warm, dry and intact. No rash noted.   ____________________________________________   LABS (all labs ordered are listed, but only abnormal results are displayed)  Labs Reviewed  CBC WITH DIFFERENTIAL/PLATELET - Abnormal; Notable for the following components:      Result Value   RBC 3.78 (*)    Hemoglobin 11.6 (*)    HCT 37.9 (*)    MCV 100.3 (*)    Neutro Abs 1.5 (*)    All other components within normal limits  RESPIRATORY PANEL BY RT PCR (FLU A&B, COVID)  PROTIME-INR  APTT  HIV ANTIBODY (ROUTINE TESTING W REFLEX)  CBC  CBC  CBC  RENAL FUNCTION PANEL  TYPE AND SCREEN  PREPARE RBC (CROSSMATCH)   ____________________________________________   PROCEDURES  Procedure(s) performed:   Procedures  CRITICAL CARE Performed by: Margette Fast Total critical care time: 45 minutes Critical care time was exclusive of separately billable procedures and treating other patients. Critical care was necessary to treat or prevent imminent or life-threatening deterioration. Critical care was time spent personally by me on  the following activities: development of treatment plan with patient and/or surrogate as well as nursing, discussions with consultants, evaluation of patient's response to treatment, examination of patient, obtaining history from patient or surrogate, ordering and performing treatments and interventions, ordering and review of laboratory studies, ordering and review of radiographic studies, pulse oximetry and re-evaluation of patient's condition.  Nanda Quinton, MD Emergency Medicine  ____________________________________________   INITIAL IMPRESSION / ASSESSMENT AND PLAN / ED COURSE  Pertinent labs & imaging results that were available during my care of the patient were reviewed by me and considered in my medical decision making (see chart for details).   Patient presents to the  emergency department evaluation of rectal bleeding postop.  He had condylomata removed at [facility] recently.  He has brisk venous oozing.  I removed the visible clot and packed the area with combat gauze with hemostasis.  CBC will be followed and reviewed. No concern for infection or perforation. Patient tolerated the procedure well.   Patient continuing to ooze blood. No change in mental status. Normal vitals. Repeat packing with combat gauze and TXA.   09:20 AM  Patient continuing to bleed. Spoke with general surgery who will be down to see the patient.  09:40 AM  Patient continuing to ooze.  Will repeat packing with combat gauze and lidocaine with epinephrine.   09:50 AM   general surgery now at bedside.  I repeated packing just prior to their arrival with combat gauze soaked with lidocaine and epinephrine.  Patient initially did well and then has passage of large clots.  The external lesions appear more hemostatic but continuing to have oozing blood from the rectum.   10:15 AM  Gen Surgery plans to take patient to the OR for exam under anesthesia. COVID pending. Sent type and screen. Will contact medicine for admit.   11:20 AM  Called to the bedside of patient becoming now hypotensive which has not been the case previously.  The OR is ready for him however blood pressures are in the 70s.  Have given IV fluid bolus and will give 1 unit of emergency release blood while transporting to the OR.  He remains awake and alert but symptomatic now.  11:30 AM  PRBC hanging. Patient going to OR. BP improved with IVF bolus with SBP 105 on my reassessment.   ____________________________________________  FINAL CLINICAL IMPRESSION(S) / ED DIAGNOSES  Final diagnoses:  Rectal bleeding  Hypotension, unspecified hypotension type     MEDICATIONS GIVEN DURING THIS VISIT:  Medications  clonazePAM (KLONOPIN) tablet 1 mg ( Oral MAR Hold 03/15/20 1140)  calcitRIOL (ROCALTROL) capsule 0.25 mcg ( Oral  Automatically Held 03/23/20 1000)  ARIPiprazole (ABILIFY) tablet 20 mg ( Oral Automatically Held 03/23/20 1000)  cloNIDine (CATAPRES) tablet 0.15 mg ( Oral Automatically Held 03/23/20 2200)  HYDROcodone-acetaminophen (NORCO/VICODIN) 5-325 MG per tablet 1 tablet ( Oral MAR Hold 03/15/20 1140)  acyclovir (ZOVIRAX) 200 MG capsule 200 mg ( Oral Automatically Held 03/23/20 2200)  ceFAZolin (ANCEF) IVPB 2g/100 mL premix ( Intravenous MAR Hold 03/15/20 1140)  cefdinir (OMNICEF) capsule 300 mg ( Oral Automatically Held 03/24/20 0700)  0.9 %  sodium chloride infusion (Manually program via Guardrails IV Fluids) ( Intravenous MAR Hold 03/15/20 1140)  acyclovir (ZOVIRAX) 200 MG capsule 200 mg ( Oral Automatically Held 03/23/20 1200)  sodium chloride 0.9 % bolus 500 mL ( Intravenous MAR Hold 03/15/20 1140)  tranexamic acid (CYKLOKAPRON) 1000 MG/10ML topical solution 500 mg (500 mg Topical Given 03/15/20 0918)  lidocaine-EPINEPHrine (  XYLOCAINE W/EPI) 2 %-1:200000 (PF) injection 20 mL (20 mLs Infiltration Given 03/15/20 0946)    Note:  This document was prepared using Dragon voice recognition software and may include unintentional dictation errors.  Nanda Quinton, MD, [facility] Emergency Medicine    Lakesha Levinson, [facility], MD 03/15/20 1153

## 2020-03-15 NOTE — Progress Notes (Signed)
Respiratory states that pt does not need hour long nebulizer treatment and that pt states he is breathing fine. RN messaged on call to make aware.   Eleanora Neighbor, RN

## 2020-03-15 NOTE — Anesthesia Preprocedure Evaluation (Signed)
Anesthesia Evaluation  Patient identified by MRN, date of birth, ID band Patient awake    Reviewed: NPO status , Patient's Chart, lab work & pertinent test resultsPreop documentation limited or incomplete due to emergent nature of procedure.  Airway Mallampati: III  TM Distance: >3 FB     Dental  (+) Teeth Intact   Pulmonary former smoker,    breath sounds clear to auscultation       Cardiovascular hypertension,  Rhythm:Regular Rate:Normal     Neuro/Psych    GI/Hepatic   Endo/Other    Renal/GU      Musculoskeletal   Abdominal (+) + obese,   Peds  Hematology   Anesthesia Other Findings Patient on a bedpan with large amount of blood and clots.   Reproductive/Obstetrics                             Anesthesia Physical Anesthesia Plan  ASA: IV and emergent  Anesthesia Plan: General   Post-op Pain Management:    Induction: Intravenous, Rapid sequence and Cricoid pressure planned  PONV Risk Score and Plan: Ondansetron  Airway Management Planned: Oral ETT  Additional Equipment:   Intra-op Plan:   Post-operative Plan: Extubation in OR  Informed Consent: I have reviewed the patients History and Physical, chart, labs and discussed the procedure including the risks, benefits and alternatives for the proposed anesthesia with the patient or authorized representative who has indicated his/her understanding and acceptance.       Plan Discussed with: CRNA and Anesthesiologist  Anesthesia Plan Comments:         Anesthesia Quick Evaluation

## 2020-03-15 NOTE — ED Notes (Signed)
Informed consent was obtained at this RN signed as witness at 1107, paper consent form at bedside.

## 2020-03-15 NOTE — Anesthesia Procedure Notes (Signed)
Procedure Name: Intubation Date/Time: 03/15/2020 11:56 AM Performed by: Janene Harvey, CRNA Pre-anesthesia Checklist: Patient identified, Emergency Drugs available, Suction available and Patient being monitored Patient Re-evaluated:Patient Re-evaluated prior to induction Oxygen Delivery Method: Circle system utilized Preoxygenation: Pre-oxygenation with 100% oxygen Induction Type: IV induction, Rapid sequence and Cricoid Pressure applied Laryngoscope Size: Miller and 2 Grade View: Grade II Tube type: Oral Tube size: 7.5 mm Number of attempts: 1 Airway Equipment and Method: Stylet and Oral airway Placement Confirmation: ETT inserted through vocal cords under direct vision,  positive ETCO2 and breath sounds checked- equal and bilateral Secured at: 23 cm Tube secured with: Tape Dental Injury: Teeth and Oropharynx as per pre-operative assessment  Comments: Intubation by Kelli Churn CRNA. RSI. DL with MAC 4, inadequate view, changed to miller 2, grade II view, ett easily passed. Upper lip laceration post intubation.

## 2020-03-15 NOTE — ED Triage Notes (Addendum)
Pt arrives to ED via [facility] d/t a repeat rectal bleeding that he had the 1st experience with yesterday. He states that after he went home from [facility] he went to sleep & when he woke at 0630 pt states that rectal bleeding began again, bright red blood down his legs. Upon arrival to ED pt is A/Ox4, verbal-able to make needs known, no c/o any adb pain or n/v, only some light headedness.

## 2020-03-15 NOTE — Transfer of Care (Signed)
Immediate Anesthesia Transfer of Care Note  Patient: [name]  Procedure(s) Performed: CONTROL OF ANAL BLEEDING (N/A Rectum)  Patient Location: PACU  Anesthesia Type:General  Level of Consciousness: drowsy, patient cooperative and responds to stimulation  Airway & Oxygen Therapy: Patient Spontanous Breathing and Patient connected to face mask oxygen  Post-op Assessment: Report given to RN and Post -op Vital signs reviewed and stable  Post vital signs: Reviewed and stable  Last Vitals:  Vitals Value Taken Time  BP 125/92 03/15/20 1258  Temp    Pulse 62 03/15/20 1259  Resp 15 03/15/20 1259  SpO2 100 % 03/15/20 1259  Vitals shown include unvalidated device data.  Last Pain:  Vitals:   03/15/20 0918  TempSrc:   PainSc: 0-No pain         Complications: No apparent anesthesia complications

## 2020-03-15 NOTE — ED Provider Notes (Signed)
[facility] Emergency Department Provider Note  ____________________________________________   First MD Initiated Contact with Patient 03/15/20 [phone]     (approximate)  I have reviewed the triage vital signs and the nursing notes.  History  Chief Complaint Rectal Bleeding    HPI [name] is a 49 y.o. male s/p recent surgical excision of perianal condyloma on 03/06/20 presents with concern for bleeding from the excision sites. Patient states he has been otherwise healing well post op until early this AM at work. Today at work he felt acute onset of blood running down his legs and found the post op area to be bleeding. He dressed the area with sterile guaze and presents to the ER for further evaluation. Not on blood thinners. No pain.   Had similar surgery in January with post op bleeding that required packing.   No syncope. No SOB. Declines blood work at this time, primarily requesting packing to help with bleeding.      Past Medical Hx Past Medical History:  Diagnosis Date  . ADHD (attention deficit hyperactivity disorder)   . Anemia   . Anemia, chronic disease 07/13/2011  . Anxiety   . Arthritis   . Bipolar 2 disorder ([place]) 08/18/2011   Diagnosed in 2011.   . Bipolar disorder ([place])   . Blood transfusion   . Blood transfusion without reported diagnosis   . Chronic diastolic CHF (congestive heart failure) ([place])   . Complication of anesthesia    Woke up intubated gets combative  afraid to be alone   . Congestive heart failure (CHF) ([place]) 07/13/2011   Onset 2006.  Followed by Dr. Johnsie Cancel.  S/p cardiac catheterization in [facility] Dr. Marlou Porch..  S/p cardiac catheterization in 2014 by Nishan.  Echo 2014.   . Depression   . Erectile dysfunction   . ESRD on hemodialysis ([place]) 07/13/2011   Glomerulonephritis at age 3, started HD in 22.  Deceased [facility] renal transplant 1999 at [facility], then kidney failed and went back on HD in 2006.  L forearm AVF . s/p  repeat renal transplant 10/2014 [facility].  . GI bleed 11/01/2011   Rectal bleeding with emesis/diarrhea   . Hemodialysis status ([place])   . Hydrocele, right 11/25/2014  . Hypertension   . Hypertensive emergency 01/06/2013  . Hypoglycemia 01/11/2013  . Influenza-like illness 12/18/2013  . Mild ascending aorta dilatation (HCC)   . NICM (nonischemic cardiomyopathy) ([place])   . Pericardial effusion    a. Mod by echo in 2014, similar to prior.  . Peripheral polyneuropathy 08/04/2016  . Pulmonary edema 01/06/2013  . Pulmonary hypertension associated with end stage renal disease on dialysis ([place]) 07/13/2011  . Renal transplant, status post 11/25/2014   Pt with Glomerulonephritis at age 50. Deceased donor renal transplant 1999 right and left renal transplant 10/2014.  Followed by [facility] transplant team; nephrologist is Dr. Barbette Merino.   . Respiratory failure with hypoxia ([place]) 01/06/2013  . Shortness of breath 12/12/2012  . Swollen testicle 07/13/2011  . URI (upper respiratory infection) 01/25/2012    Problem List Patient Active Problem List   Diagnosis Date Noted  . Peripheral polyneuropathy 08/04/2016  . Pericardial effusion   . Renal transplant, status post 11/25/2014  . Hydrocele, right 11/25/2014  . Health care maintenance 09/10/2014  . Influenza-like illness 12/18/2013  . Bipolar 2 disorder ([place]) 08/18/2011  . ESRD on hemodialysis ([place]) 07/13/2011  . Congestive heart failure (CHF) ([place]) 07/13/2011  . Anemia, chronic disease 07/13/2011  . Hypertension 07/13/2011  Past Surgical Hx Past Surgical History:  Procedure Laterality Date  . ANGIOPLASTY    . CHOLECYSTECTOMY    . DIALYSIS FISTULA CREATION    . KIDNEY TRANSPLANT  10/17/14  . KNEE SURGERY    . LEFT HEART CATHETERIZATION WITH CORONARY ANGIOGRAM N/A 10/01/2013   Procedure: LEFT HEART CATHETERIZATION WITH CORONARY ANGIOGRAM;  Surgeon: [name], MD;  Location: [facility] CATH LAB;  Service: Cardiovascular;  Laterality: N/A;  . MOUTH SURGERY     . NEPHRECTOMY TRANSPLANTED ORGAN      Medications Prior to Admission medications   Medication Sig Start Date End Date Taking? Authorizing Provider  aluminum-magnesium hydroxide-simethicone (MAALOX) 892-119-41 MG/5ML SUSP Take 15 mLs by mouth 2 (two) times daily as needed. 07/17/19   Jaynee Eagles, PA-C  amLODipine (NORVASC) 10 MG tablet Take 10 mg by mouth 2 (two) times daily before a meal.    [provider]  amphetamine-dextroamphetamine (ADDERALL) 10 MG tablet Take 1 tablet (10 mg total) by mouth 2 (two) times daily with a meal. 03/11/15   Wardell Honour, MD  ARIPiprazole (ABILIFY) 20 MG tablet Take 20 mg by mouth daily.    [provider]  aspirin EC 81 MG tablet Take 81 mg by mouth daily.    [provider]  carvedilol (COREG) 25 MG tablet Take 25 mg by mouth 2 (two) times daily with a meal.    [provider]  Cinacalcet HCl (SENSIPAR PO) Take by mouth.    [provider]  clonazePAM (KLONOPIN) 1 MG tablet Take 0.3 mg by mouth daily.     [provider]  cloNIDine (CATAPRES) 0.3 MG tablet Take 0.15 mg by mouth 2 (two) times daily.     [provider]  ethambutol (MYAMBUTOL) 400 MG tablet Take 1,600 mg by mouth daily. 05/02/16   [provider]  HYDROcodone-acetaminophen (NORCO/VICODIN) 5-325 MG per tablet Take 1 tablet by mouth every 6 (six) hours as needed for moderate pain. 02/05/15   Wardell Honour, MD  isoniazid (NYDRAZID) 300 MG tablet Take 300 mg by mouth daily. 07/01/16   [provider]  magic mouthwash w/lidocaine SOLN Take 5 mLs by mouth 3 (three) times daily as needed for mouth pain. 07/13/19   Jacqualine Mau, NP  mycophenolate (CELLCEPT) 200 MG/ML suspension Take 500 mg by mouth 2 (two) times daily. 06/22/16   [provider]  pantoprazole (PROTONIX) 40 MG tablet Take 40 mg by mouth 2 (two) times daily.     [provider]  predniSONE (STERAPRED UNI-PAK) 5 MG TABS tablet Take 10  mg by mouth daily.     [provider]  pyridOXINE (B-6) 50 MG tablet Take 1 tablet by mouth daily. 02/25/16   [provider]  Rifabutin (MYCOBUTIN PO) Take 1 Dose by mouth daily.    [provider]  Sevelamer Carbonate (RENVELA PO) Take by mouth.    [provider]  sodium bicarbonate 650 MG tablet Take 1,300 mg by mouth daily.     [provider]  sulfamethoxazole-trimethoprim (BACTRIM,SEPTRA) 400-80 MG per tablet Take 1 tablet by mouth 3 (three) times a week.    [provider]  tacrolimus (PROGRAF) 5 MG capsule Take 6 mg by mouth 2 (two) times daily.    [provider]  tadalafil (CIALIS) 20 MG tablet Take 20 mg by mouth daily as needed.     [provider]  vardenafil (LEVITRA) 20 MG tablet Take 20 mg by mouth daily as needed  for erectile dysfunction.    [provider]    Allergies Delacort [hydrocortisone base]; Benicar [olmesartan]; Food; Hydralazine; and Nitrates, organic  Family Hx Family History  Problem Relation Age of Onset  . Arthritis Mother   . Hypertension Mother   . Diabetes Mother   . Depression Mother   . Arthritis Father   . Hypertension Father   . Diabetes Father   . Depression Father     Social Hx Social History   Tobacco Use  . Smoking status: Former Smoker    Types: Cigars  . Smokeless tobacco: Never Used  Substance Use Topics  . Alcohol use: No    Alcohol/week: 1.0 standard drinks    Types: 1 Standard drinks or equivalent per week    Comment: Stopped drinking after Kidney transplant in December 2015  . Drug use: No     Review of Systems  Constitutional: Negative for fever. Negative for chills. Eyes: Negative for visual changes. ENT: Negative for sore throat. Cardiovascular: Negative for chest pain. Respiratory: Negative for shortness of breath. Gastrointestinal: Negative for nausea. Negative for vomiting.  Genitourinary: Negative for dysuria. Musculoskeletal:  Negative for leg swelling. Skin: + post op site bleeding Neurological: Negative for headaches.   Physical Exam  Vital Signs: ED Triage Vitals  Enc Vitals Group     BP 03/15/20 0240 102/81     Pulse Rate 03/15/20 0240 78     Resp 03/15/20 0240 16     Temp 03/15/20 0240 (!) 97.4 F (36.3 C)     Temp Source 03/15/20 0240 Oral     SpO2 03/15/20 0240 97 %     Weight 03/15/20 0241 220 lb (99.8 kg)     Height 03/15/20 0241 6\' 3"  (1.905 m)     Head Circumference --      Peak Flow --      Pain Score 03/15/20 0241 0     Pain Loc --      Pain Edu? --      Excl. in [place]? --     Constitutional: Alert and oriented. Well appearing. NAD.  Head: Normocephalic. Atraumatic. Eyes: Conjunctivae clear. Sclera anicteric. Pupils equal and symmetric. Nose: No masses or lesions. No congestion or rhinorrhea. Mouth/Throat: Wearing mask.  Neck: No stridor. Trachea midline.  Cardiovascular: Normal rate. Extremities well perfused. Respiratory: Normal respiratory effort.  Gastrointestinal: Soft. Non-distended. Non-tender.  Genitourinary: RN chaperone present. Guaze removed. Two perirectal areas, ~1 cm in length, that correlate to recent excision sites, these are hemostatic without any active bleeding. Tissue bed of wounds appears pink and well healing without superimposed erythema, drainage, or pus. Replaced guaze with Quick Clot 4 x 4 guaze.  Musculoskeletal: No lower extremity edema. No deformities. Neurologic:  Normal speech and language. No gross focal or lateralizing neurologic deficits are appreciated.  Skin: See above.  Psychiatric: Mood and affect are appropriate for situation.   Procedures  Procedure(s) performed (including critical care):  Procedures   Initial Impression / Assessment and Plan / MDM / ED Course  49 y.o. male who presents to the ED for bleeding from recent surgical site, s/p perianal condyloma excision on 4/30. Started bleeding while at work and patient packed external  guaze. Guaze removed here and patient examined with RN chaperone. Two perirectal areas, ~1 cm in length, that correlate to recent excision sites, these are hemostatic without any active bleeding. Tissue bed of wounds appears pink and well healing without superimposed erythema, drainage, or pus. Replaced guaze with Quick Clot  4 x 4. Bleeding seems to have resolved by arrival to the ED with patient's intervention. He is otherwise feeling well and does not desire blood work. Given cessation of bleeding and no other symptoms feel this is reasonable. Advised close outpatient follow up and given return precautions.    _______________________________   As part of my medical decision making I have reviewed available labs, radiology tests, reviewed old records/performed chart review.     Final Clinical Impression(s) / ED Diagnosis  Final diagnoses:  Bleeding       Note:  This document was prepared using Dragon voice recognition software and may include unintentional dictation errors.   Lilia Pro., MD 03/15/20 [phone]

## 2020-03-15 NOTE — Progress Notes (Signed)
CRITICAL VALUE ALERT  Critical Value:  K+ 6.3  Date & Time Notied: 03/15/20 @1745   Provider Notified:Dr Earlene Plater MD

## 2020-03-15 NOTE — ED Notes (Signed)
Pt called out and stated he feels like he will "pass out" ... this RN and ED tech ran to room & evaluated the situation. Pt's SBP was in the 70's, he looked pale/gray, he remained alert/verbal but was anxious saying "please help me." This RN approached an EDP & spoke the situation for help to be given & orders if needed. 2 L of NS fluid was hung to bolus & his bed was placed in trendelenburg, his SBP came up to the 100's within a few minutes, family at bedside remains updated. OR has called to report they are ready for pt during the onset of pt's symptom progression.

## 2020-03-15 NOTE — ED Notes (Signed)
[name] has put in combat gauze 3 times d/t pt having gushes of blood dislodging the packing from his rectum.

## 2020-03-15 NOTE — Consult Note (Signed)
[name] 05-07-1971  [id number].    Requesting MD: Dr. Laverta Baltimore Chief Complaint/Reason for Consult: GI bleed s/p   HPI: [name] is a 49 y.o. male with past medical history of ESRD on HD s/p 2 renal transplants, pulmonary hypertension, NICM, hypertension, and CHF who recently underwent excision of perianal, intra-anal and rectal condyloma by Dr. Morton Stall at [facility] on 4/30 that presents for GI bleed.  Patient reports he was at work ([employer] in [place]) when he began having BRB from rectum.  He presented to [facility] where he reports the area was packed and the bleeding stopped.  He went to his aunts house in [place] and when he awoke he had large clots and BRB from the rectum.  He presented to [facility] for evaluation.  Despite packing with combat gauze as well as a lidocaine soaked gauze patient continues to have ongoing bleeding from the rectum as well as large clots.  We were asked to see.  He reports that he takes a baby aspirin daily.  No other blood thinners.  ROS: Review of Systems  Constitutional: Negative for chills and fever.  Respiratory: Negative for cough and shortness of breath.   Cardiovascular: Negative for chest pain.  Gastrointestinal: Positive for blood in stool. Negative for abdominal pain, constipation, diarrhea, nausea and vomiting.  Musculoskeletal: Negative for myalgias.  Psychiatric/Behavioral: Negative for substance abuse.  All other systems reviewed and are negative.   Family History  Problem Relation Age of Onset  . Arthritis Mother   . Hypertension Mother   . Diabetes Mother   . Depression Mother   . Arthritis Father   . Hypertension Father   . Diabetes Father   . Depression Father     Past Medical History:  Diagnosis Date  . ADHD (attention deficit hyperactivity disorder)   . Anemia   . Anemia, chronic disease 07/13/2011  . Anxiety   . Arthritis   . Bipolar 2 disorder ([place]) 08/18/2011   Diagnosed in 2011.   . Bipolar disorder ([place])   .  Blood transfusion   . Blood transfusion without reported diagnosis   . Chronic diastolic CHF (congestive heart failure) ([place])   . Complication of anesthesia    Woke up intubated gets combative  afraid to be alone   . Congestive heart failure (CHF) ([place]) 07/13/2011   Onset 2006.  Followed by Dr. Johnsie Cancel.  S/p cardiac catheterization in [facility] Dr. Marlou Porch..  S/p cardiac catheterization in 2014 by Nishan.  Echo 2014.   . Depression   . Erectile dysfunction   . ESRD on hemodialysis ([place]) 07/13/2011   Glomerulonephritis at age 24, started HD in 61.  Deceased donor renal transplant 1999 at [facility], then kidney failed and went back on HD in 2006.  L forearm AVF . s/p repeat renal transplant 10/2014 [facility].  . GI bleed 11/01/2011   Rectal bleeding with emesis/diarrhea   . Hemodialysis status ([place])   . Hydrocele, right 11/25/2014  . Hypertension   . Hypertensive emergency 01/06/2013  . Hypoglycemia 01/11/2013  . Influenza-like illness 12/18/2013  . Mild ascending aorta dilatation (HCC)   . NICM (nonischemic cardiomyopathy) ([place])   . Pericardial effusion    a. Mod by echo in 2014, similar to prior.  . Peripheral polyneuropathy 08/04/2016  . Pulmonary edema 01/06/2013  . Pulmonary hypertension associated with end stage renal disease on dialysis ([place]) 07/13/2011  . Renal transplant, status post 11/25/2014   Pt with Glomerulonephritis at age 70. Deceased donor renal  transplant 1999 right and left renal transplant 10/2014.  Followed by [facility] transplant team; nephrologist is Dr. Barbette Merino.   . Respiratory failure with hypoxia ([place]) 01/06/2013  . Shortness of breath 12/12/2012  . Swollen testicle 07/13/2011  . URI (upper respiratory infection) 01/25/2012    Past Surgical History:  Procedure Laterality Date  . ANGIOPLASTY    . CHOLECYSTECTOMY    . DIALYSIS FISTULA CREATION    . KIDNEY TRANSPLANT  10/17/14  . KNEE SURGERY    . LEFT HEART CATHETERIZATION WITH CORONARY ANGIOGRAM N/A 10/01/2013   Procedure:  LEFT HEART CATHETERIZATION WITH CORONARY ANGIOGRAM;  Surgeon: [name], MD;  Location: [facility] CATH LAB;  Service: Cardiovascular;  Laterality: N/A;  . MOUTH SURGERY    . NEPHRECTOMY TRANSPLANTED ORGAN      Social History:  reports that he has quit smoking. His smoking use included cigars. He has never used smokeless tobacco. He reports that he does not drink alcohol or use drugs.  Allergies:  Allergies  Allergen Reactions  . Delacort [Hydrocortisone Base] Nausea Only  . Benicar [Olmesartan]     Unknown  . Food Itching    bananas  . Hydralazine     Head ache  . Nitrates, Organic Other (See Comments)    Palpitations, headache    (Not in a hospital admission)    Physical Exam: Blood pressure 136/74, pulse 64, temperature 98.1 F (36.7 C), temperature source Oral, resp. rate 16, height 6\' 3"  (1.905 m), weight 102.1 kg, SpO2 100 %. General: pleasant, WD/WN male who is laying in bed in NAD HEENT: head is normocephalic, atraumatic.  Sclera are noninjected.  PERRL.  Ears and nose without any masses or lesions.  Mouth is pink and moist. Dentition fair Heart: regular, rate, and rhythm.  No obvious murmur. Palpable radial pulses bilaterally  Lungs: CTAB, no wheezes, rhonchi, or rales noted.  Respiratory effort nonlabored Abd: Soft, NT/ND, +BS, no masses, hernias, or organomegaly GU: Chaperones present. Packing was being removed when I arrived in the room and multiple clots were expelled from the anus as seen below. External surgical wounds from prior condyloma removal without obvious active bleeding when watched for several minutes. See pictures below.  MS: no BUE/BLE edema, calves soft and nontender Skin: warm and dry with no masses, lesions, or rashes Psych: A&Ox4 with an appropriate affect Neuro: cranial nerves grossly intact, equal strength in BUE/BLE bilaterally, normal speech, though process intact       Results for orders placed or performed during the hospital encounter of  03/15/20 (from the past 48 hour(s))  CBC with Differential     Status: Abnormal   Collection Time: 03/15/20  9:47 AM  Result Value Ref Range   WBC 4.5 4.0 - 10.5 K/uL   RBC 3.78 (L) 4.22 - 5.81 MIL/uL   Hemoglobin 11.6 (L) 13.0 - 17.0 g/dL   HCT 37.9 (L) 39.0 - 52.0 %   MCV 100.3 (H) 80.0 - 100.0 fL   MCH 30.7 26.0 - 34.0 pg   MCHC 30.6 30.0 - 36.0 g/dL   RDW 13.2 11.5 - 15.5 %   Platelets 157 150 - 400 K/uL   nRBC 0.0 0.0 - 0.2 %   Neutrophils Relative % 34 %   Neutro Abs 1.5 (L) 1.7 - 7.7 K/uL   Lymphocytes Relative 48 %   Lymphs Abs 2.2 0.7 - 4.0 K/uL   Monocytes Relative 12 %   Monocytes Absolute 0.5 0.1 - 1.0 K/uL   Eosinophils Relative 5 %  Eosinophils Absolute 0.2 0.0 - 0.5 K/uL   Basophils Relative 1 %   Basophils Absolute 0.0 0.0 - 0.1 K/uL   Immature Granulocytes 0 %   Abs Immature Granulocytes 0.01 0.00 - 0.07 K/uL    Comment: Performed at [facility] [address] [place], [place] [address]   No results found.  Anti-infectives (From admission, onward)   None       Assessment/Plan ESRD on HD s/p 2 renal transplants pulmonary hypertension NICM, hypertension CHF  GI Bleed Hx excision of perianal, intra-anal and rectal condyloma by Dr. Morton Stall at [facility] on 4/30  - Will take patient to the OR for EAU. We discussed risks of the surgery including but not limited to anesthesia (MI, CVA, prolonged intubation, death), bleeding, infection and injury to surrounding structures. He seems to understand this and agrees to proceed  FEN - NPO VTE - SCDs ID - Ancef.   Jillyn Ledger, [facility] 03/15/2020, 10:08 AM Please see Amion for pager number during day hours 7:00am-4:30pm

## 2020-03-15 NOTE — Op Note (Signed)
03/15/2020  12:53 PM  PATIENT:  [name]  49 y.o. male  Patient Care Team: Danna Hefty, DO as PCP - General Myrlene Broker, MD as Attending Physician (Urology)  PRE-OPERATIVE DIAGNOSIS:  Rectal bleeding  POST-OPERATIVE DIAGNOSIS:  Same  PROCEDURE:   1. Suture ligation of bleeding hemorrhoidal tissue 2. Anorectal exam under anesthesia  SURGEON:  Surgeon(s): Ileana Roup, MD  ANESTHESIA:   general  SPECIMEN:  No Specimen  DISPOSITION OF SPECIMEN:  N/A  COUNTS:  Sponge, needle, and instrument counts were reported correct x2 at conclusion.  EBL: 10 mL during procedure  Drains: none  PLAN OF CARE: Admit for overnight observation  PATIENT DISPOSITION:  PACU - hemodynamically stable.  INDICATION: [name] is a 39yoM with significant past medical history as noted, whom presented to the ER at [facility] with bright red blood per rectum that began in the last 12 hours.  He had undergone relatively extensive removal of perianal, anal, and distal rectal condyloma 03/06/20 at [facility] with Dr. Morton Stall.  He was transiently hypotensive in the emergency department here and was passing large volume of clots per rectum.  We therefore discussed proceeding emergently with anorectal exam under anesthesia and suture ligation of bleeding vessels.  Please refer to notes elsewhere for details regarding his discussion.  OR FINDINGS: Large clots in rectal vault.  Posterior midline and left posterior areas of active oozing at the site of prior condyloma excision.  These were controlled with suture ligation.  DESCRIPTION: The patient was identified in the preoperative holding area and taken to the OR. He  was placed on the operating room table. SCDs were placed.  General endotracheal anesthesia was induced without difficulty. The patient was then positioned in [place]. Pressure points were then padded. He was prepped and draped in usual sterile fashion.  A surgical  timeout was performed indicating the correct patient, procedure, positioning.    A well lubricated digital rectal exam was performed which demonstrated no palpable abnormalities.  A Hill-Ferguson anoscope was into the anal canal and circumferential inspection demonstrated multiple areas of condyloma excision/fulguration with exposed ulcerated tissue.  Area of active oozing noted in the left posterior lateral and posterior midline portions of the proximal anal canal.  These were controlled with electrocautery followed by suture ligatures using 2-0 Vicryl suture.  The anal canal was irrigated and thoroughly reinspected.  There is no additional areas of bleeding.  The anoscope was removed and 5 minutes of time was given.  The anal canal was then reinspected.  There was no active bleeding.  A piece of Surgifoam was placed in the anal canal.  A dressing consisting of 4 x 4's, ABD, mesh underwear was placed.  He was taken out of the lithotomy position, awakened from anesthesia, extubated, and transferred to a stretcher for transport to PACU in satisfactory condition.  He received 2 units of PRBC intraoperatively and remained hemodynamically stable.  DISPOSITION: PACU in satisfactory condition.

## 2020-03-15 NOTE — ED Triage Notes (Signed)
Pt had rectal surgery last Friday. Pt states tonight returned to work and was moving around and began to experience rectal bleeding. Pt states soaked his underwear and work uniform. Pt denies pain.

## 2020-03-15 NOTE — Anesthesia Postprocedure Evaluation (Signed)
Anesthesia Post Note  Patient: [name]  Procedure(s) Performed: CONTROL OF ANAL BLEEDING (N/A Rectum)     Patient location during evaluation: PACU Anesthesia Type: General Level of consciousness: awake and alert Pain management: pain level controlled Vital Signs Assessment: post-procedure vital signs reviewed and stable Respiratory status: spontaneous breathing, nonlabored ventilation, respiratory function stable and patient connected to nasal cannula oxygen Cardiovascular status: blood pressure returned to baseline and stable Postop Assessment: no apparent nausea or vomiting Anesthetic complications: no    Last Vitals:  Vitals:   03/15/20 2010 03/15/20 2114  BP:  105/75  Pulse:  72  Resp:  14  Temp:  36.9 C  SpO2: 96% 93%    Last Pain:  Vitals:   03/15/20 2114  TempSrc: Oral  PainSc:                  Erasmo Vertz COKER

## 2020-03-15 NOTE — Progress Notes (Signed)
Lab called RN regarding Potassium of 6.7. RN messaged on call to make aware.   Eleanora Neighbor, RN

## 2020-03-15 NOTE — H&P (Addendum)
Date: 03/15/2020               Patient Name:  [name] MRN: [id number]  DOB: 07-31-1971 Age / Sex: 49 y.o., male   PCP: Danna Hefty, DO         Medical Service: Internal Medicine Teaching Service         Attending Physician: Dr. Velna Ochs, MD    First Contact: Josepha Pigg, MS3 Pager: [phone]  Second Contact: Dr. Sheppard Coil Pager: [phone]       After Hours (After 5p/  First Contact Pager: [phone]  weekends / holidays): Second Contact Pager: [phone]   Chief Complaint: rectal bleeding  History of Present Illness: Pt is a 49-yr-old man with PMH significant for genital HPV, HTN, bipolar disorder, ESRD, bilateral kidney transplant, and bullous pemphigoid who presents to the hospital with rectal bleeding that began early this morning while pt was working. Pt used gauze to pack the rectum, which helped stop the bleeding, and went to the ED. He had a previous episode of rectal bleeding on 11/30/19. Denies fevers, chills, nausea, vomiting, diarrhea, constipation, and rectal pain. Surgical history is significant for excision and fulguration of perianal condyloma performed on 03/06/20.   Pt receives dialysis on Mondays, Wednesdays, and Fridays. He missed his last Wednesday appointment but is otherwise consistent with treatment.  In the ED, CBC with diff, PT/INR, aPTT, blood type, and blood transfusion were performed. Hgb is 11.6 and MCV 100.3. Labs are otherwise unremarkable.  Meds: As below. Last took prednisone 2 days ago Current Meds  Medication Sig  . acyclovir (ZOVIRAX) 200 MG capsule Take 200 mg by mouth See admin instructions. 200mg  by mouth twice daily. Take and additional 200mg  capsule by mouth after dialysis on dialysis days.  Marland Kitchen amLODipine (NORVASC) 10 MG tablet Take 10 mg by mouth 2 (two) times daily before a meal.  . ARIPiprazole (ABILIFY) 20 MG tablet Take 20 mg by mouth daily.  Marland Kitchen aspirin EC 81 MG tablet Take 81 mg by mouth daily.  . calcitRIOL (ROCALTROL) 0.25  MCG capsule Take 0.25 mcg by mouth daily.  . carvedilol (COREG) 25 MG tablet Take 25 mg by mouth 2 (two) times daily with a meal.  . cefdinir (OMNICEF) 300 MG capsule Take 300 mg by mouth daily.   . clonazePAM (KLONOPIN) 1 MG tablet Take 0.3 mg by mouth daily.   . cloNIDine (CATAPRES) 0.3 MG tablet Take 0.15 mg by mouth 2 (two) times daily.   Marland Kitchen HYDROcodone-acetaminophen (NORCO/VICODIN) 5-325 MG per tablet Take 1 tablet by mouth every 6 (six) hours as needed for moderate pain.  . silver sulfADIAZINE (SILVADENE) 1 % cream Apply 1 application topically in the morning and at bedtime. To open sores.     Allergies: Allergies as of 03/15/2020 - Review Complete 03/15/2020  Allergen Reaction Noted  . Delacort [hydrocortisone base] Nausea Only 07/05/2011  . Benicar [olmesartan]  01/05/2013  . Food Itching 07/05/2011  . Hydralazine  01/05/2013  . Nitrates, organic Other (See Comments) 07/05/2011   Past Medical History:  Diagnosis Date  . ADHD (attention deficit hyperactivity disorder)   . Anemia   . Anemia, chronic disease 07/13/2011  . Anxiety   . Arthritis   . Bipolar 2 disorder ([place]) 08/18/2011   Diagnosed in 2011.   . Bipolar disorder ([place])   . Blood transfusion   . Blood transfusion without reported diagnosis   . Chronic diastolic CHF (congestive heart failure) ([place])   . Complication of  anesthesia    Woke up intubated gets combative  afraid to be alone   . Congestive heart failure (CHF) ([place]) 07/13/2011   Onset 2006.  Followed by Dr. Johnsie Cancel.  S/p cardiac catheterization in [facility] Dr. Marlou Porch..  S/p cardiac catheterization in 2014 by [facility].  Echo 2014.   . Depression   . Erectile dysfunction   . ESRD on hemodialysis ([place]) 07/13/2011   Glomerulonephritis at age 29, started HD in 3.  Deceased donor renal transplant 1999 at [facility], then kidney failed and went back on HD in 2006.  L forearm AVF . s/p repeat renal transplant 10/2014 [facility].  . GI bleed 11/01/2011   Rectal bleeding with  emesis/diarrhea   . Hemodialysis status ([place])   . Hydrocele, right 11/25/2014  . Hypertension   . Hypertensive emergency 01/06/2013  . Hypoglycemia 01/11/2013  . Influenza-like illness 12/18/2013  . Mild ascending aorta dilatation (HCC)   . NICM (nonischemic cardiomyopathy) ([place])   . Pericardial effusion    a. Mod by echo in 2014, similar to prior.  . Peripheral polyneuropathy 08/04/2016  . Pulmonary edema 01/06/2013  . Pulmonary hypertension associated with end stage renal disease on dialysis ([place]) 07/13/2011  . Renal transplant, status post 11/25/2014   Pt with Glomerulonephritis at age 30. Deceased donor renal transplant 1999 right and left renal transplant 10/2014.  Followed by [facility] transplant team; nephrologist is Dr. Barbette Merino.   . Respiratory failure with hypoxia ([place]) 01/06/2013  . Shortness of breath 12/12/2012  . Swollen testicle 07/13/2011  . URI (upper respiratory infection) 01/25/2012    Family History: significant for diabetes and HTN, negative for cancer  Social History: negative for tobacco product use, EtOH, and illicit drug use  Review of Systems: A complete ROS was negative except as per HPI.  Physical Exam: Blood pressure 108/75, pulse 63, temperature 98.1 F (36.7 C), temperature source Oral, resp. rate 17, height 6\' 3"  (1.905 m), weight 102.1 kg, SpO2 100 %. General: Pt is in NAD. HEENT: [place]/AT. Grossly EOMI Cardiac: Normal S1, S2. RRR, no murmurs, rubs, or gallops Respiratory: Lungs are CTA bilaterally. No wheezes, rales, or rhonchi GI: Abdomen is soft and non-tender. Scars are present on bilateral flanks Neuro: Alert and oriented x3. No gross neurologic abnormalities notes Extremities: spontaneously moving all extremities Skin: Warm and dry. No erythema or rashes noted. Scars per GI exam. AV fistula present on left arm for dialysis  Assessment & Plan by Problem: Active Problems:   Rectal bleed  Pt is a 49-yr-old man with PMH significant for genital HPV, HTN,  bipolar disorder, ESRD, bilateral kidney transplant, and bullous pemphigoid who presents to the hospital with rectal bleeding. Surgical workup is indicated to determine source of bleeding.  Rectal bleeding: Given pt's history of HPV and use of immunosuppressants, rectal bleeding is concerning for recurrence of HPV. GI malignancy is also high on the differential. Surgery will evaluate today to determine source of bleeding. -OR evaluation -IV cefazolin 2 g at 200 mL/hr for surgical prophylaxis -IV Zofran 4 mg QD PRN -Oxycodone 5 mg QD PRN -Hydrocodone/acetaminophen 5-325 mg q6h PRN -IV fentanyl 25-50 mcg PRN  Anemia: Hgb is 11.6 and MCV is 100.3. Labs are consistent with a normocytic anemia, likely due to rectal hemorrhage. -Monitor CBC daily -Transfuse for Hgb < 8  ESRD Kidney transplant: Pt receives dialysis on Mondays, Wednesdays, and Fridays. He missed his last Wednesday appointment but is otherwise consistent with treatment. -Contact nephrology tomorrow for pt dialysis  Bipolar disorder Anxiety: -Aripiprazole  20 mg QD -Clonazepam 1 mg QD PRN  HTN: -Clonidine 0.15 mg BID  UTI: -Cefdinir 300 mg every other day  HSV prophylaxis: Pt has history of oral HSV. -Acyclovir 200 mg every other day  FEN/GI: -IV 0.9% NaCl 10 mL/hr -Calcitriol 0.25 mcg QD  DVT prophylaxis: -SCDs  CODE STATUS: FULL  Dispo: Admit patient to Inpatient with expected length of stay greater than 2 midnights.  Signed: Josepha Pigg, Medical Student 03/15/2020, 11:05 AM  Pager: [phone]  Attestation for Student Documentation:  I personally was present and performed or re-performed the history, physical exam and medical decision-making activities of this service and have verified that the service and findings are accurately documented in the student's note.  Earlene Plater, MD Internal Medicine, PGY1 Pager: [phone]  03/15/2020,1:26 PM

## 2020-03-15 NOTE — ED Notes (Signed)
Pt has 1 unit of blood hanging that was from emergency release, & its bolusing in d/t pts current condition of decreased bp & c/o feeling like he is going to "pass out." This RN & ED tech is leaving the unit now & headed to Short stay/OR emergently with pt. Pt family member at bedside updated.

## 2020-03-15 NOTE — Discharge Instructions (Addendum)
Thank you for letting us take care of you in the ER today.   We put some "quick clot" guaze over your excision site areas.   Call your doctor tomorrow or Monday to update them on your symptoms and your ER visit. Discuss w/ your surgeon about appropriate wound care (if you would benefit from wet to dry dressings).   Return to the ER for any new or worsening symptoms.

## 2020-03-16 DIAGNOSIS — K625 Hemorrhage of anus and rectum: Secondary | ICD-10-CM | POA: Diagnosis not present

## 2020-03-16 LAB — HEPATIC FUNCTION PANEL
ALT: 10 U/L (ref 0–44)
AST: 15 U/L (ref 15–41)
Albumin: 2.9 g/dL — ABNORMAL LOW (ref 3.5–5.0)
Alkaline Phosphatase: 69 U/L (ref 38–126)
Bilirubin, Direct: <0.1 mg/dL (ref 0.0–0.2)
Total Bilirubin: 0.3 mg/dL (ref 0.3–1.2)
Total Protein: 5.9 g/dL — ABNORMAL LOW (ref 6.5–8.1)

## 2020-03-16 LAB — RENAL FUNCTION PANEL
Albumin: 2.9 g/dL — ABNORMAL LOW (ref 3.5–5.0)
Anion gap: 9 (ref 5–15)
BUN: 21 mg/dL — ABNORMAL HIGH (ref 6–20)
CO2: 27 mmol/L (ref 22–32)
Calcium: 8.5 mg/dL — ABNORMAL LOW (ref 8.9–10.3)
Chloride: 100 mmol/L (ref 98–111)
Creatinine, Ser: 6.88 mg/dL — ABNORMAL HIGH (ref 0.61–1.24)
GFR calc Af Amer: 10 mL/min — ABNORMAL LOW (ref >60)
GFR calc non Af Amer: 9 mL/min — ABNORMAL LOW (ref >60)
Glucose, Bld: 112 mg/dL — ABNORMAL HIGH (ref 70–99)
Phosphorus: 2.8 mg/dL (ref 2.5–4.6)
Potassium: 4.1 mmol/L (ref 3.5–5.1)
Sodium: 136 mmol/L (ref 135–145)

## 2020-03-16 LAB — PREPARE RBC (CROSSMATCH)

## 2020-03-16 MED ORDER — SODIUM CHLORIDE 0.9 % IV SOLN: 100.0000 mL | INTRAVENOUS | Status: DC | PRN

## 2020-03-16 MED ORDER — PENTAFLUOROPROP-TETRAFLUOROETH EX AERO: 1.0000 "application " | INHALATION_SPRAY | CUTANEOUS | Status: DC | PRN

## 2020-03-16 MED ORDER — DEXTROSE 50 % IV SOLN
1.0000 | Freq: Once | INTRAVENOUS | Status: AC
  Administered 2020-03-16: 50 mL via INTRAVENOUS
  Filled 2020-03-16: qty 50

## 2020-03-16 MED ORDER — CALCIUM GLUCONATE-NACL 1-0.675 GM/50ML-% IV SOLN
1.0000 g | Freq: Once | INTRAVENOUS | Status: AC
  Administered 2020-03-16: 1000 mg via INTRAVENOUS
  Filled 2020-03-16: qty 50

## 2020-03-16 MED ORDER — ALBUTEROL SULFATE (2.5 MG/3ML) 0.083% IN NEBU
5.0000 mg | INHALATION_SOLUTION | RESPIRATORY_TRACT | Status: AC
  Administered 2020-03-16: 5 mg via RESPIRATORY_TRACT
  Filled 2020-03-16: qty 6

## 2020-03-16 MED ORDER — INSULIN ASPART 100 UNIT/ML ~~LOC~~ SOLN
10.0000 [IU] | Freq: Once | SUBCUTANEOUS | Status: AC
  Administered 2020-03-16: 10 [IU] via SUBCUTANEOUS

## 2020-03-16 MED ORDER — LIDOCAINE-PRILOCAINE 2.5-2.5 % EX CREA: 1.0000 "application " | TOPICAL_CREAM | CUTANEOUS | Status: DC | PRN

## 2020-03-16 MED ORDER — LIDOCAINE HCL (PF) 1 % IJ SOLN: 5.0000 mL | INTRAMUSCULAR | Status: DC | PRN

## 2020-03-16 NOTE — Progress Notes (Signed)
1 Day Post-Op  Subjective: CC: Doing well. Some pain after surgery but controlled with oral medications. No further bleeding reported.   Objective: Vital signs in last 24 hours: Temp:  [96.7 F (35.9 C)-98.5 F (36.9 C)] 98 F (36.7 C) (05/10 0710) Pulse Rate:  [50-76] 76 (05/10 0710) Resp:  [14-26] 18 (05/10 0710) BP: (73-147)/(52-92) 138/79 (05/10 0710) SpO2:  [92 %-100 %] 100 % (05/10 0710) Weight:  [103.9 kg-105.4 kg] 103.9 kg (05/10 0710) Last BM Date: (PTA)  Intake/Output from previous day: 05/09 0701 - 05/10 0700 In: 2300 [P.O.:435; I.V.:1000; Blood:315; IV Piggyback:550] Out: 37 [Urine:10] Intake/Output this shift: Total I/O In: -  Out: 58 [Other:610]  PE: Gen: Awake and alert, NAD Abd: Soft, ND, NT GU: Dressing removed with scant amount of blood noted on dressing that was placed in OR. No signs of active bleeding.   Lab Results:  Recent Labs    03/15/20 0947 03/15/20 0947 03/15/20 1221 03/15/20 1626  WBC 4.5  --   --  6.3  HGB 11.6*   < > 8.8* 9.1*  HCT 37.9*   < > 26.0* 29.3*  PLT 157  --   --  123*   < > = values in this interval not displayed.   BMET Recent Labs    03/15/20 1626 03/15/20 2137  NA 139 134*  K 6.3* 6.7*  CL 106 104  CO2 23 21*  GLUCOSE 130* 154*  BUN 54* 59*  CREATININE 16.21* 17.13*  CALCIUM 8.5* 8.4*   PT/INR Recent Labs    03/15/20 1015  LABPROT 14.5  INR 1.2   CMP     Component Value Date/Time   NA 134 (L) 03/15/2020 2137   K 6.7 (HH) 03/15/2020 2137   CL 104 03/15/2020 2137   CO2 21 (L) 03/15/2020 2137   GLUCOSE 154 (H) 03/15/2020 2137   BUN 59 (H) 03/15/2020 2137   CREATININE 17.13 (H) 03/15/2020 2137   CALCIUM 8.4 (L) 03/15/2020 2137   CALCIUM 9.8 02/18/2011 1007   PROT 9.4 (H) 12/18/2013 1040   ALBUMIN 2.9 (L) 03/15/2020 1626   AST 17 12/18/2013 1040   ALT 23 12/18/2013 1040   ALKPHOS 254 (H) 12/18/2013 1040   BILITOT 0.6 12/18/2013 1040   GFRNONAA 3 (L) 03/15/2020 2137   GFRAA 3 (L)  03/15/2020 2137   Lipase     Component Value Date/Time   LIPASE 137 (H) 12/18/2013 1346       Studies/Results: No results found.  Anti-infectives: Anti-infectives (From admission, onward)   Start     Dose/Rate Route Frequency Ordered Stop   03/16/20 1200  acyclovir (ZOVIRAX) 200 MG capsule 200 mg     200 mg Oral Every M-W-F (Hemodialysis) 03/15/20 1108     03/16/20 0700  cefdinir (OMNICEF) capsule 300 mg     300 mg Oral Every 48 hours 03/15/20 1105     03/15/20 1115  acyclovir (ZOVIRAX) 200 MG capsule 200 mg     200 mg Oral 2 times daily 03/15/20 1058     03/15/20 1100  cefdinir (OMNICEF) capsule 300 mg  Status:  Discontinued     300 mg Oral Daily 03/15/20 1058 03/15/20 1105   03/15/20 1100  ceFAZolin (ANCEF) IVPB 2g/100 mL premix     2 g 200 mL/hr over 30 Minutes Intravenous On call to O.R. 03/15/20 1059 03/16/20 0559       Assessment/Plan ESRD on HD s/p 2 renal transplants pulmonary hypertension NICM Hypertension CHF  GI Bleed following an excision of perianal, intra-anal and rectal condyloma by Dr. Morton Stall at [facility] on 4/30  S/p Anorectal EUA and suture ligation of bleeding hemorrhoidal tissue, Dr. Dema Severin,  5/9, POD #1 - No signs of further bleeding post op - Patient can be d/c'd from our standpoint with follow up with Dr. Morton Stall. He reports that he has a scheduled appointment on 5/18.   FEN - Soft VTE - SCDs ID - Ancef Periop   LOS: 1 day    Jillyn Ledger , [facility] 03/16/2020, 9:15 AM Please see Amion for pager number during day hours 7:00am-4:30pm

## 2020-03-16 NOTE — Progress Notes (Signed)
   Subjective: Pt is a 49-yr-old man with PMH significant for genital HPV, HTN, bipolar disorder, ESRD, bilateral kidney transplant, and bullous pemphigoid who presented to the hospital yesterday with rectal bleeding. Visited at bedside, pt doing well. Denies rectal bleeding, N/V/D, and abdominal pain.  Objective:  Vital signs in last 24 hours: Vitals:   03/16/20 0530 03/16/20 0600 03/16/20 0630 03/16/20 0710  BP: 104/65 94/63 117/69 138/79  Pulse:    76  Resp:    18  Temp:    98 F (36.7 C)  TempSrc:    Oral  SpO2:    100%  Weight:    103.9 kg  Height:       General: Pt is well-appearing and in NAD. HEENT: [place]/AT. Grossly EOMI Cardiac: Normal S1, S2. RRR, no rubs or gallops. Audible flow murmur present Respiratory: Lungs are CTA bilaterally. No wheezes, rales, or rhonchi GI: Abdomen is soft and non-tender. Scars are present on bilateral flanks Neuro: Alert and oriented x3. No gross neurologic abnormalities notes Extremities: spontaneously moving all extremities Skin: Warm and dry. No erythema or rashes noted. Scars per GI exam. AV fistula present on left arm for dialysis Rectal exam: Deferred to surgery  Assessment/Plan:  Active Problems:   Rectal bleed  Pt is a 49-yr-old man with PMH significant for genital HPV, HTN, bipolar disorder, ESRD, bilateral kidney transplant, and bullous pemphigoid who presented to the hospital yesterday with rectal bleeding and is doing well after surgery. Sites of bleeding in the rectum cauterized and sutured. Pt ready for discharge  Rectal bleeding: Anoscopy done in the OR found exposed ulcerated tissue and areas of active oozing in left posterior lateral and posterior midline portions of the proximal anal canal. Areas of bleeding cauterized and sutured -IV hydromorphone 0.5-1 mg q4h PRN -Hydrocodone-acetaminophen 5-325 mg q6h PRN  Anemia: Last Hgb is 9.1 and MCV is 98.3. Labs are consistent with a normocytic anemia, likely due to rectal  hemorrhage. Pt received 2 units of pRBCs intraoperatively, so Hgb expected to improve  ESRD Kidney transplant: Pt receives dialysis on Mondays, Wednesdays, and Fridays. Last night, potassium spiked to 6.7. Dialysis done overnight. Potassium improved to 4.1 -Continue outpatient dialysis  Bipolar disorder Anxiety: -Aripiprazole 10 mg QD -Clonazepam 1 mg QD PRN  HTN: -Clonidine 0.15 mg BID  UTI: -Cefdinir 300 mg every other day  HSV prophylaxis: Pt has history of oral HSV. -Acyclovir 200 mg BID  FEN/GI: -IV 0.9% NaCl 10 mL/hr -Calcitriol 0.25 mcg QD  DVT prophylaxis: -SCDs  CODE STATUS: FULL  Prior to Admission Living Arrangement: Home Anticipated Discharge Location: Home Barriers to Discharge: None Dispo: Discharge today  Josepha Pigg, Medical Student 03/16/2020, 1:55 PM Pager: [phone]

## 2020-03-16 NOTE — Progress Notes (Signed)
Patient discharge teaching given, including activity, diet, follow-up appoints, and medications. Patient verbalized understanding of all discharge instructions. IV access was d/c'd. Vitals are stable. Skin is intact except as charted in most recent assessments. Pt to be escorted out by NT, to be driven home by family. 

## 2020-03-16 NOTE — Care Management CC44 (Signed)
Condition Code 44 Documentation Completed  Patient Details  Name: [name] MRN: [id number] Date of Birth: Oct 20, 1971   Condition Code 44 given:  Yes Patient signature on Condition Code 44 notice:  Yes Documentation of 2 MD's agreement:  Yes Code 44 added to claim:  Yes    Bartholomew Crews, RN 03/16/2020, 11:00 AM

## 2020-03-16 NOTE — Discharge Instructions (Signed)
How to Take a Sitz Bath A sitz bath is a warm water bath that may be used to care for your rectum, genital area, or the area between your rectum and genitals (perineum). For a sitz bath, the water only comes up to your hips and covers your buttocks. A sitz bath may done at home in a bathtub or with a portable sitz bath that fits over the toilet. Your health care provider may recommend a sitz bath to help:  Relieve pain and discomfort after delivering a baby.  Relieve pain and itching from hemorrhoids or anal fissures.  Relieve pain after certain surgeries.  Relax muscles that are sore or tight. How to take a sitz bath Take 3-4 sitz baths a day, or as many as told by your health care provider. Bathtub sitz bath To take a sitz bath in a bathtub: 1. Partially fill a bathtub with warm water. The water should be deep enough to cover your hips and buttocks when you are sitting in the tub. 2. If your health care provider told you to put medicine in the water, follow his or her instructions. 3. Sit in the water. 4. Open the tub drain a little, and leave it open during your bath. 5. Turn on the warm water again, enough to replace the water that is draining out. Keep the water running throughout your bath. This helps keep the water at the right level and the right temperature. 6. Soak in the water for 15-20 minutes, or as long as told by your health care provider. 7. When you are done, be careful when you stand up. You may feel dizzy. 8. After the sitz bath, pat yourself dry. Do not rub your skin to dry it.  Over-the-toilet sitz bath To take a sitz bath with an over-the-toilet basin: 1. Follow the manufacturer's instructions. 2. Fill the basin with warm water. 3. If your health care provider told you to put medicine in the water, follow his or her instructions. 4. Sit on the seat. Make sure the water covers your buttocks and perineum. 5. Soak in the water for 15-20 minutes, or as long as told by  your health care provider. 6. After the sitz bath, pat yourself dry. Do not rub your skin to dry it. 7. Clean and dry the basin between uses. 8. Discard the basin if it cracks, or according to the manufacturer's instructions. Contact a health care provider if:  Your symptoms get worse. Do not continue with sitz baths if your symptoms get worse.  You have new symptoms. If this happens, do not continue with sitz baths until you talk with your health care provider. Summary  A sitz bath is a warm water bath in which the water only comes up to your hips and covers your buttocks.  A sitz bath may help relieve itching, relieve pain, and relax muscles that are sore or tight in the lower part of your body, including your genital area.  Take 3-4 sitz baths a day, or as many as told by your health care provider. Soak in the water for 15-20 minutes.  Do not continue with sitz baths if your symptoms get worse. This information is not intended to replace advice given to you by your health care provider. Make sure you discuss any questions you have with your health care provider. Document Revised: 03/25/2019 Document Reviewed: 10/26/2017 Elsevier Patient Education  2020 [employer]  

## 2020-03-16 NOTE — Consult Note (Signed)
[name] Admit Date: 03/15/2020 03/16/2020 Rexene Agent Requesting Physician:  Philipp Ovens MD  Reason for Consult:  ESRD Hyperkalemia HPI:  48M ESRD admitted earlier today with BRBPR and s/p suture ligation ofof internal hemorrhoids.  Is ESRD MWF LFA AVF with [facility]. Last HD 5/7.    Admit K6.0, worsened to 6.7 overnight.  Treated with veltass and calcium gluconate.   Hb 9.1.  BPs stable.  Pt sleeping, No c/o.  No LEE, SOB, CP  PMH Incudes:  DM2  BPAD  CHF  Failed KT   Creatinine, Ser (mg/dL)  Date Value  03/15/2020 17.13 (H)  03/15/2020 16.21 (H)  03/15/2020 15.40 (H)  12/20/2013 12.58 (H)  12/19/2013 15.75 (H)  12/18/2013 12.40 (H)  09/26/2013 18.3 (HH)  01/07/2013 8.07 (H)  01/06/2013 7.38 (H)  01/05/2013 10.93 (H)  ]  ROS Balance of 12 systems is negative w/ exceptions as above  PMH  Past Medical History:  Diagnosis Date  . ADHD (attention deficit hyperactivity disorder)   . Anemia   . Anemia, chronic disease 07/13/2011  . Anxiety   . Arthritis   . Bipolar 2 disorder ([place]) 08/18/2011   Diagnosed in 2011.   . Bipolar disorder ([place])   . Blood transfusion   . Blood transfusion without reported diagnosis   . Chronic diastolic CHF (congestive heart failure) ([place])   . Complication of anesthesia    Woke up intubated gets combative  afraid to be alone   . Congestive heart failure (CHF) ([place]) 07/13/2011   Onset 2006.  Followed by Dr. Johnsie Cancel.  S/p cardiac catheterization in [facility] Dr. Marlou Porch..  S/p cardiac catheterization in 2014 by Nishan.  Echo 2014.   . Depression   . Erectile dysfunction   . ESRD on hemodialysis ([place]) 07/13/2011   Glomerulonephritis at age 35, started HD in 44.  Deceased donor renal transplant 1999 at [facility], then kidney failed and went back on HD in 2006.  L forearm AVF . s/p repeat renal transplant 10/2014 [facility].  . GI bleed 11/01/2011   Rectal bleeding with emesis/diarrhea   . Hemodialysis status ([place])   . Hydrocele, right 11/25/2014  .  Hypertension   . Hypertensive emergency 01/06/2013  . Hypoglycemia 01/11/2013  . Influenza-like illness 12/18/2013  . Mild ascending aorta dilatation (HCC)   . NICM (nonischemic cardiomyopathy) ([place])   . Pericardial effusion    a. Mod by echo in 2014, similar to prior.  . Peripheral polyneuropathy 08/04/2016  . Pulmonary edema 01/06/2013  . Pulmonary hypertension associated with end stage renal disease on dialysis ([place]) 07/13/2011  . Renal transplant, status post 11/25/2014   Pt with Glomerulonephritis at age 42. Deceased donor renal transplant 1999 right and left renal transplant 10/2014.  Followed by [facility] transplant team; nephrologist is Dr. Barbette Merino.   . Respiratory failure with hypoxia ([place]) 01/06/2013  . Shortness of breath 12/12/2012  . Swollen testicle 07/13/2011  . URI (upper respiratory infection) 01/25/2012   PSH  Past Surgical History:  Procedure Laterality Date  . ANGIOPLASTY    . CHOLECYSTECTOMY    . DIALYSIS FISTULA CREATION    . KIDNEY TRANSPLANT  10/17/14  . KNEE SURGERY    . LEFT HEART CATHETERIZATION WITH CORONARY ANGIOGRAM N/A 10/01/2013   Procedure: LEFT HEART CATHETERIZATION WITH CORONARY ANGIOGRAM;  Surgeon: [name], MD;  Location: [facility] CATH LAB;  Service: Cardiovascular;  Laterality: N/A;  . MOUTH SURGERY    . NEPHRECTOMY TRANSPLANTED ORGAN     FH  Family History  Problem Relation Age of Onset  . Arthritis Mother   . Hypertension Mother   . Diabetes Mother   . Depression Mother   . Arthritis Father   . Hypertension Father   . Diabetes Father   . Depression Father    SH  reports that he has quit smoking. His smoking use included cigars. He has never used smokeless tobacco. He reports that he does not drink alcohol or use drugs. Allergies  Allergies  Allergen Reactions  . Delacort [Hydrocortisone Base] Nausea Only  . Benicar [Olmesartan]     Unknown  . Food Itching    bananas  . Hydralazine     Head ache  . Nitrates, Organic Other (See Comments)     Palpitations, headache   Home medications Prior to Admission medications   Medication Sig Start Date End Date Taking? Authorizing Provider  acyclovir (ZOVIRAX) 200 MG capsule Take 200 mg by mouth See admin instructions. 200mg  by mouth twice daily. Take and additional 200mg  capsule by mouth after dialysis on dialysis days. 02/17/20  Yes [provider]  amLODipine (NORVASC) 10 MG tablet Take 10 mg by mouth 2 (two) times daily before a meal.   Yes [provider]  ARIPiprazole (ABILIFY) 20 MG tablet Take 20 mg by mouth daily.   Yes [provider]  aspirin EC 81 MG tablet Take 81 mg by mouth daily.   Yes [provider]  calcitRIOL (ROCALTROL) 0.25 MCG capsule Take 0.25 mcg by mouth daily. 03/08/19  Yes [provider]  carvedilol (COREG) 25 MG tablet Take 25 mg by mouth 2 (two) times daily with a meal.   Yes [provider]  cefdinir (OMNICEF) 300 MG capsule Take 300 mg by mouth daily.  03/13/20 03/20/20 Yes [provider]  clonazePAM (KLONOPIN) 1 MG tablet Take 0.3 mg by mouth daily.    Yes [provider]  cloNIDine (CATAPRES) 0.3 MG tablet Take 0.15 mg by mouth 2 (two) times daily.    Yes [provider]  HYDROcodone-acetaminophen (NORCO/VICODIN) 5-325 MG per tablet Take 1 tablet by mouth every 6 (six) hours as needed for moderate pain. 02/05/15  Yes Wardell Honour, MD  silver sulfADIAZINE (SILVADENE) 1 % cream Apply 1 application topically in the morning and at bedtime. To open sores. 07/16/19  Yes [provider]  aluminum-magnesium hydroxide-simethicone (MAALOX) 161-096-04 MG/5ML SUSP Take 15 mLs by mouth 2 (two) times daily as needed. Patient not taking: Reported on 03/15/2020 07/17/19   Jaynee Eagles, PA-C  amphetamine-dextroamphetamine (ADDERALL) 10 MG tablet Take 1 tablet (10 mg total) by mouth 2 (two) times daily with a meal. Patient not taking: Reported on 03/15/2020 03/11/15   Wardell Honour, MD  magic  mouthwash w/lidocaine SOLN Take 5 mLs by mouth 3 (three) times daily as needed for mouth pain. Patient not taking: Reported on 03/15/2020 07/13/19   Jacqualine Mau, NP    Current Medications Scheduled Meds: . sodium chloride   Intravenous Once  . acyclovir  200 mg Oral BID  . acyclovir  200 mg Oral Q M,W,F-HD  . albuterol  5 mg Inhalation STAT  . ARIPiprazole  10 mg Oral Daily  . calcitRIOL  0.25 mcg Oral Daily  . cefdinir  300 mg Oral Q48H  . cloNIDine  0.15 mg Oral BID   Continuous Infusions: . calcium gluconate 1,000 mg (03/16/20 0105)  .  ceFAZolin (ANCEF) IV    . sodium chloride     PRN Meds:.clonazePAM, HYDROcodone-acetaminophen, HYDROmorphone (  DILAUDID) injection  CBC Recent Labs  Lab 03/15/20 0947 03/15/20 1221 03/15/20 1626  WBC 4.5  --  6.3  NEUTROABS 1.5*  --   --   HGB 11.6* 8.8* 9.1*  HCT 37.9* 26.0* 29.3*  MCV 100.3*  --  98.3  PLT 157  --  335*   Basic Metabolic Panel Recent Labs  Lab 03/15/20 1221 03/15/20 1626 03/15/20 2137  NA 144 139 134*  K 6.0* 6.3* 6.7*  CL 103 106 104  CO2  --  23 21*  GLUCOSE 117* 130* 154*  BUN 59* 54* 59*  CREATININE 15.40* 16.21* 17.13*  CALCIUM  --  8.5* 8.4*  PHOS  --  6.3*  --     Physical Exam  Blood pressure 105/75, pulse 72, temperature 98.5 F (36.9 C), temperature source Oral, resp. rate 14, height 6\' 3"  (1.905 m), weight 104.1 kg, SpO2 93 %. GEN: NAD, resting comfortably ENT: NCAT EYES: EOMI CV: RRR nl s1s2 no rub PULM: ctab ABD: s/nt/nd SKIN: no rashes/lesions EXT:No LEE VASC: L FA AVF aneurysmal +B/T NEURO: Nonfocal  Assessment 63M ESRD MWF LFA AVF at [facility] with BRBPR s/p ligation of hemorrhoids, now hyperkalemia  1. ESRD LFA AVF MWF 2. Hyperkalemia, moderate 3. LGIB, hemorrhoidal s/p suture ligation 5/10 4. Anemia, mild 5. CKD-BMD mild HyperP  Plan 1. HD now, 4h, 400/800, 2K, AVF, no heparin 2. Daily weights, Daily Renal Panel, Strict I/Os, Avoid nephrotoxins (NSAIDs, judicious IV  Contrast)    Rexene Agent  03/16/2020, 1:37 AM

## 2020-03-16 NOTE — Discharge Summary (Signed)
Name: [name] MRN: [id number] DOB: 20-Apr-1971 49 y.o. PCP: Danna Hefty, DO  Date of Admission: 03/15/2020  8:23 AM Date of Discharge: 03/16/2020 Attending Physician: Velna Ochs, MD  Discharge Diagnosis: 1. Rectal Bleeding  2. ESRD s/p 2 failed renal transplant   Discharge Medications: Allergies as of 03/16/2020      Reactions   Delacort [hydrocortisone Base] Nausea Only   Benicar [olmesartan]    Unknown   Food Itching   bananas   Hydralazine    Head ache   Nitrates, Organic Other (See Comments)   Palpitations, headache      Medication List    TAKE these medications   acyclovir 200 MG capsule Commonly known as: ZOVIRAX Take 200 mg by mouth See admin instructions. 200mg  by mouth twice daily. Take and additional 200mg  capsule by mouth after dialysis on dialysis days.   aluminum-magnesium hydroxide-simethicone 824-235-36 MG/5ML Susp Commonly known as: MAALOX Take 15 mLs by mouth 2 (two) times daily as needed.   amLODipine 10 MG tablet Commonly known as: NORVASC Take 10 mg by mouth 2 (two) times daily before a meal.   amphetamine-dextroamphetamine 10 MG tablet Commonly known as: ADDERALL Take 1 tablet (10 mg total) by mouth 2 (two) times daily with a meal.   ARIPiprazole 20 MG tablet Commonly known as: ABILIFY Take 20 mg by mouth daily.   aspirin EC 81 MG tablet Take 81 mg by mouth daily.   calcitRIOL 0.25 MCG capsule Commonly known as: ROCALTROL Take 0.25 mcg by mouth daily.   carvedilol 25 MG tablet Commonly known as: COREG Take 25 mg by mouth 2 (two) times daily with a meal.   cefdinir 300 MG capsule Commonly known as: OMNICEF Take 300 mg by mouth daily.   clonazePAM 1 MG tablet Commonly known as: KLONOPIN Take 0.3 mg by mouth daily.   cloNIDine 0.3 MG tablet Commonly known as: CATAPRES Take 0.15 mg by mouth 2 (two) times daily.   HYDROcodone-acetaminophen 5-325 MG tablet Commonly known as: NORCO/VICODIN Take 1 tablet by mouth  every 6 (six) hours as needed for moderate pain.   magic mouthwash w/lidocaine Soln Take 5 mLs by mouth 3 (three) times daily as needed for mouth pain.   silver sulfADIAZINE 1 % cream Commonly known as: SILVADENE Apply 1 application topically in the morning and at bedtime. To open sores.      Disposition and follow-up:   [name] was discharged from [facility] in [place] condition. At the hospital follow up visit please address:  1. Rectal Bleeding:  S/p excision and fulguration of perianal condyloma performed on 03/06/2020. Repeat anoscopy showed ulcerated tissue and areas of active oozing in the left posterior lateral and posterior midline portions and proximal anal canal. These areas were sutured and cauterized.  Patient required 2 units of PRBCs intraoperatively.  Hemoglobin was stable upon discharge (9.1).  2. ESRD S/p 2 failed renal transplant: Required urgent dialysis session for hyperkalemia to 6.3.  Patient to remain on Monday Wednesday Friday schedule in the outpatient setting.  3.  Labs / imaging needed at time of follow-up: cbc, renal function panel  4.  Pending labs/ test needing follow-up: None  Follow-up Appointments: Follow-up Information    Mark Cockayne, MD Follow up.   Specialty: Surgical Oncology Why: Please go to you appointment on 5/18 for follow up.  Contact information: [place] [address] [phone]        Surgery, [place]. Call.   Specialty: General Surgery Why: As needed Contact information: [address] [place] [address] [phone]          Hospital Course by problem list: 1. Rectal Bleeding  2. ESRD s/p 2 failed renal transplant  In summary, [name] is a 49 year old male with past medical history significant for genital HPV, perirectal condyloma, hypertension, bipolar disorder, end-stage renal disease status post 2 failed renal transplant,  and bullous pemphigoid who presents to the hospital with rectal bleeding.  This is in the context of excision and fulguration of perianal condyloma performed on 03/06/2020. The patient was taking immediately to the OR with general surgery where an anoscopy was performed and demonstrated exposed ulcerated tissue and areas of active oozing in the left posterior lateral and posterior midline portions and proximal anal canal. These areas were cauterized and sutured.  The patient was given 2 units of PRBCs intraoperatively.  Shortly after surgery, the patient was found to have hyperkalemia to 6.3 and was urgently taken to hemodialysis.  Postop day 1, the patient had stable blood counts and was discharged with a plan to continue HD and his Monday Wednesday Friday regimen at home.  Discharge Vitals:   BP 138/79 (BP Location: Right Arm)   Pulse 76   Temp 98 F (36.7 C) (Oral)   Resp 18   Ht 6\' 3"  (1.905 m)   Wt 103.9 kg   SpO2 100%   BMI 28.63 kg/m   Pertinent Labs, Studies, and Procedures:  CBC Latest Ref Rng & Units 03/15/2020 03/15/2020 03/15/2020  WBC 4.0 - 10.5 K/uL 6.3 - 4.5  Hemoglobin 13.0 - 17.0 g/dL 9.1(L) 8.8(L) 11.6(L)  Hematocrit 39.0 - 52.0 % 29.3(L) 26.0(L) 37.9(L)  Platelets 150 - 400 K/uL 123(L) - 157   CMP Latest Ref Rng & Units 03/16/2020 03/15/2020 03/15/2020  Glucose 70 - 99 mg/dL 112(H) 154(H) 130(H)  BUN 6 - 20 mg/dL 21(H) 59(H) 54(H)  Creatinine 0.61 - 1.24 mg/dL 6.88(H) 17.13(H) 16.21(H)  Sodium 135 - 145 mmol/L 136 134(L) 139  Potassium 3.5 - 5.1 mmol/L 4.1 6.7(HH) 6.3(HH)  Chloride 98 - 111 mmol/L 100 104 106  CO2 22 - 32 mmol/L 27 21(L) 23  Calcium 8.9 - 10.3 mg/dL 8.5(L) 8.4(L) 8.5(L)  Total Protein 6.5 - 8.1 g/dL 5.9(L) - -  Total Bilirubin 0.3 - 1.2 mg/dL 0.3 - -  Alkaline Phos 38 - 126 U/L 69 - -  AST 15 - 41 U/L 15 - -  ALT 0 - 44 U/L 10 - -   Discharge Instructions: Discharge Instructions    Call MD for:  difficulty breathing, headache or visual disturbances    Complete by: As directed    Call MD for:  extreme fatigue   Complete by: As directed    Call MD for:  hives   Complete by: As directed    Call MD for:  persistant dizziness or light-headedness   Complete by: As directed    Call MD for:  persistant nausea and vomiting   Complete by: As directed    Call MD for:  redness, tenderness, or signs of infection (pain, swelling, redness, odor or green/yellow discharge around incision site)   Complete by: As directed    Call MD for:  severe uncontrolled pain   Complete by: As directed    Call MD for:  temperature >100.4   Complete by: As directed    Diet - low sodium heart healthy   Complete  by: As directed    Discharge instructions   Complete by: As directed    Thank you for allowing [place] to take care of you during your hospitalization. Below is a summary of what we treated:  1. Rectal bleeding -The surgeons performed surgery and cauterized and stitched the lesions that were bleeding.  -You have a follow up appointment with surgery on May 18th with Dr. Morton Stall  2. End Stage Renal Disease -You received dialysis to normalize your electrolytes.  -Please continue to get your dialysis in the outpatient setting on your normal schedule.   Increase activity slowly   Complete by: As directed      Signed: Earlene Plater, MD Internal Medicine, PGY1 Pager: [phone]  03/17/2020,11:19 AM

## 2020-03-16 NOTE — Care Management Obs Status (Signed)
[facility] NOTIFICATION   Patient Details  Name: [name] MRN: [id number] Date of Birth: February 19, 1971   Medicare Observation Status Notification Given:  Yes    Bartholomew Crews, RN 03/16/2020, 11:00 AM

## 2020-03-17 LAB — TYPE AND SCREEN
ABO/RH(D): A POS
Antibody Screen: NEGATIVE
Unit division: 0
Unit division: 0
Unit division: 0
Unit division: 0
Unit division: 0

## 2020-03-17 LAB — BPAM RBC
Blood Product Expiration Date: 202106022359
Blood Product Expiration Date: 202106032359
Blood Product Expiration Date: 202106042359
Blood Product Expiration Date: 202106042359
Blood Product Expiration Date: 202106092359
ISSUE DATE / TIME: 202105091126
ISSUE DATE / TIME: 202105091138
ISSUE DATE / TIME: 202105091138
ISSUE DATE / TIME: 202105091138
Unit Type and Rh: 5100
Unit Type and Rh: 6200
Unit Type and Rh: 6200
Unit Type and Rh: 6200
Unit Type and Rh: 6200

## 2020-03-17 LAB — HIV ANTIBODY (ROUTINE TESTING W REFLEX): HIV Screen 4th Generation wRfx: NONREACTIVE

## 2020-04-15 ENCOUNTER — Telehealth: Payer: Self-pay

## 2020-04-15 NOTE — Telephone Encounter (Signed)
NOTES ON FILE FROM [facility] [phone], SENT REFERRAL TO [place]

## 2020-05-06 NOTE — Progress Notes (Signed)
Cardiology Office Note:   Date:  05/07/2020  NAME:  [name]    MRN: [id number] DOB:  [birth year]   PCP:  Danna Hefty, DO  Cardiologist:  No primary care provider on file.   Referring MD: Joesph July, MD   Chief Complaint  Patient presents with  . Chest Pain   History of Present Illness:   [name] is a 49 y.o. male with a hx of ESRD, HTN who is being seen today for the evaluation of preoperative assessment at the request of Danna Hefty, DO.  He reports that he has a current left AV fistula in the arm.  He reports that there is restricted blood flow to the hand.  He reports that his surgeon Dr. Raynelle Jan [id number]) would like to perform a fistula of the right arm.  He would like to know if his heart is strong enough to have to facial the same time that he may use the right arm fistula prior to closing the left arm fistula.  He is not had a cardiac evaluation in several years.  He did have a left heart catheterization 2014 that was normal.  His echocardiogram then demonstrated aortic valve sclerosis.  Today he has a murmur of aortic stenosis with radiation into the carotid arteries.  His EKG shows normal sinus rhythm with LVH.  He reports that he is quite active at work.  He walks 10,000 steps 3 days/week.  This is equates to 5-1/2 miles.  He denies any chest pain or shortness of breath with level activity.  He does have a history of hypertension but it seems to be related to his dialysis.  He has a long history of ESRD.  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  He has been through 2 kidney transplants.  He currently is on dialysis.  He is unsure if he would like to proceed with further transplants.  He apparently did not do well with the last transplant.  He does not smoke or drink alcohol.  No illicit drugs reported.  Family history significant for diabetes and hypertension.  Problem list 1. ESRD -s/p failed kidney transplant  -Left heart  catheterization 2014 with normal coronary arteries 2. HTN 3. Perianal condyloma   Past Medical History: Past Medical History:  Diagnosis Date  . ADHD (attention deficit hyperactivity disorder)   . Anemia   . Anemia, chronic disease 07/13/2011  . Anxiety   . Arthritis   . Bipolar 2 disorder ([place]) 08/18/2011   Diagnosed in 2011.   . Bipolar disorder ([place])   . Blood transfusion   . Blood transfusion without reported diagnosis   . Chronic diastolic CHF (congestive heart failure) ([place])   . Complication of anesthesia    Woke up intubated gets combative  afraid to be alone   . Congestive heart failure (CHF) ([place]) 07/13/2011   Onset 2006.  Followed by Dr. Johnsie Cancel.  S/p cardiac catheterization in [facility] Dr. Marlou Porch..  S/p cardiac catheterization in 2014 by [facility].  Echo 2014.   . Depression   . Erectile dysfunction   . ESRD on hemodialysis ([place]) 07/13/2011   Glomerulonephritis at age 69, started HD in 46.  Deceased donor renal transplant 1999 [facility], then kidney failed and went back on HD in 2006.  L forearm AVF . s/p repeat renal transplant 10/2014 [facility].  . GI bleed 11/01/2011   Rectal bleeding with emesis/diarrhea   . Hemodialysis status ([place])   . Hydrocele, right  11/25/2014  . Hypertension   . Hypertensive emergency 01/06/2013  . Hypoglycemia 01/11/2013  . Influenza-like illness 12/18/2013  . Mild ascending aorta dilatation (H[facility])   . NICM (nonischemic cardiomyopathy) ([place])   . Pericardial effusion    a. Mod by echo in 2014, similar to prior.  . Peripheral polyneuropathy 08/04/2016  . Pulmonary edema 01/06/2013  . Pulmonary hypertension associated with end stage renal disease on dialysis ([place]) 07/13/2011  . Renal transplant, status post 11/25/2014   Pt with Glomerulonephritis at age 33. Deceased donor renal transplant 1999 right and left renal transplant 10/2014.  Followed by [facility] transplant team; nephrologist is Dr. Barbette Merino.   . Respiratory failure with hypoxia ([place]) 01/06/2013    . Shortness of breath 12/12/2012  . Swollen testicle 07/13/2011  . URI (upper respiratory infection) 01/25/2012    Past Surgical History: Past Surgical History:  Procedure Laterality Date  . ANGIOPLASTY    . CHOLECYSTECTOMY    . DIALYSIS FISTULA CREATION    . EVALUATION UNDER ANESTHESIA WITH HEMORRHOIDECTOMY N/A 03/15/2020   Procedure: CONTROL OF ANAL BLEEDING;  Surgeon: Ileana Roup, MD;  Location: [place];  Service: General;  Laterality: N/A;  . KIDNEY TRANSPLANT  10/17/14  . KNEE SURGERY    . LEFT HEART CATHETERIZATION WITH CORONARY ANGIOGRAM N/A 10/01/2013   Procedure: LEFT HEART CATHETERIZATION WITH CORONARY ANGIOGRAM;  Surgeon: [name], MD;  Location[facility] CATH LAB;  Service: Cardiovascular;  Laterality: N/A;  . MOUTH SURGERY    . NEPHRECTOMY TRANSPLANTED ORGAN      Current Medications: Current Meds  Medication Sig  . acyclovir (ZOVIRAX) 200 MG capsule Take 200 mg by mouth See admin instructions. 200mg  by mouth twice daily. Take and additional 200mg  capsule by mouth after dialysis on dialysis days.  . ARIPiprazole (ABILIFY) 20 MG tablet Take 20 mg by mouth daily.  Marland Kitchen aspirin EC 81 MG tablet Take 81 mg by mouth daily.  . carvedilol (COREG) 25 MG tablet Take 25 mg by mouth 2 (two) times daily with a meal.  . clonazePAM (KLONOPIN) 1 MG tablet Take 0.3 mg by mouth daily.   Marland Kitchen HYDROcodone-acetaminophen (NORCO/VICODIN) 5-325 MG per tablet Take 1 tablet by mouth every 6 (six) hours as needed for moderate pain.  . [DISCONTINUED] cloNIDine (CATAPRES) 0.3 MG tablet Take 0.15 mg by mouth 2 (two) times daily.      Allergies:    Delacort [hydrocortisone base]; Benicar [olmesartan]; Food; Hydralazine; and Nitrates, organic   Social History: Social History   Socioeconomic History  . Marital status: Single    Spouse name: Not on file  . Number of children: 0  . Years of education: 69+  . Highest education level: Not on file  Occupational History  . Occupation: Chef- fulltime   Tobacco Use  . Smoking status: Former Smoker    Years: 2.00    Types: Cigars  . Smokeless tobacco: Never Used  Vaping Use  . Vaping Use: Never used  Substance and Sexual Activity  . Alcohol use: No    Alcohol/week: 1.0 standard drink    Types: 1 Standard drinks or equivalent per week    Comment: Stopped drinking after Kidney transplant in December 2015  . Drug use: No  . Sexual activity: Yes  Other Topics Concern  . 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      Tobacco: none      Alcohol: none      Drugs: none      Exercise: cycling; 3 days per week.      Sexual activity: females only; no male encounters; Chlamydia age 32.  Total partner = 55.  Condoms.      Caffeine use: Soda sometimes   Social Determinants of Radio broadcast assistant Strain:   . Difficulty of Paying Living Expenses:   Food Insecurity:   . Worried About Charity fundraiser in the Last Year:   . Arboriculturist in the Last Year:   Transportation Needs:   . Film/video editor (Medical):   Marland Kitchen Lack of Transportation (Non-Medical):   Physical Activity:   . Days of Exercise per Week:   . Minutes of Exercise per Session:   Stress:   . Feeling of Stress :   Social Connections:   . Frequency of Communication with Friends and Family:   . Frequency of Social Gatherings with Friends and Family:   . Attends Religious Services:   . Active Member of Clubs or Organizations:   . Attends Archivist Meetings:   Marland Kitchen Marital Status:      Family History: The patient's family history includes Arthritis in his father and mother; Depression in his father and mother; Diabetes in his father and mother; Hypertension in his father and mother.  ROS:   All other ROS reviewed and negative. Pertinent positives noted in the HPI.      EKGs/Labs/Other Studies Reviewed:   The following studies were personally reviewed by me today:  EKG:  EKG is ordered today.  The ekg ordered today demonstrates normal sinus rhythm, heart rate 73, LVH by voltage, repolarization abnormality, and was personally reviewed by me.   TTE 08/04/2017 - Left ventricle: The cavity size was normal. Wall thickness was  normal. Systolic function was normal. The estimated ejection  fraction was in the range of 60% to 65%. Doppler parameters are  consistent with both elevated ventricular end-diastolic filling  pressure and elevated left atrial filling pressure.  - Aortic valve: Moderaely calcified left coronary cusp.  - Mitral valve: Calcified annulus. Mildly thickened leaflets .  - Left atrium: The atrium was mildly dilated.  - Atrial septum: No defect or patent foramen ovale was identified.   Recent Labs: 03/15/2020: Hemoglobin 9.1; Platelets 123 03/16/2020: ALT 10; BUN 21; Creatinine, Ser 6.88; Potassium 4.1; Sodium 136   Recent Lipid Panel    Component Value Date/Time   CHOL  11/04/2007 0424    83        ATP III CLASSIFICATION:  <200     mg/dL   Desirable  200-239  mg/dL   Borderline High  >=240    mg/dL   High   TRIG 60 11/04/2007 0424   HDL 20 (L) 11/04/2007 0424   CHOLHDL 4.2 11/04/2007 0424   VLDL 12 11/04/2007 0424   LDLCALC  11/04/2007 0424    51        Total Cholesterol/HDL:CHD Risk Coronary Heart Disease Risk Table                     Men   Women  1/2 Average Risk   3.4   3.3    Physical Exam:   VS:  BP 126/82   Pulse 73   Ht 6\' 3"  (1.905 m)  Wt 226 lb 6.4 oz (102.7 kg)   BMI 28.30 kg/m    Wt Readings from Last 3 Encounters:  05/07/20 226 lb 6.4 oz (102.7 kg)  03/16/20 229 lb 0.9 oz (103.9 kg)  03/15/20 220 lb (99.8 kg)    General: Well nourished, well developed, in no acute distress Heart: Atraumatic, normal size  Eyes: PEERLA, EOMI  Neck: Supple, no JVD Endocrine: No thryomegaly Cardiac: Normal S1, S2;  2 out of 6 systolic ejection murmur best heard at left lower sternal border that radiates into carotids Lungs: Clear to auscultation bilaterally, no wheezing, rhonchi or rales  Abd: Soft, nontender, no hepatomegaly  Ext: Left arm fistula with good bruit Musculoskeletal: No deformities, BUE and BLE strength normal and equal Skin: Warm and dry, no rashes   Neuro: Alert and oriented to person, place, time, and situation, CNII-XII grossly intact, no focal deficits  Psych: Normal mood and affect   ASSESSMENT:   [name] is a 49 y.o. male who presents for the following: 1. Preoperative cardiovascular examination   2. Murmur, cardiac   3. Hypertension secondary to other renal disorders     PLAN:   1. Preoperative cardiovascular examination 2. Murmur, cardiac -He reports he will have a new AV fistula placed in his right arm.  His left AV fistula is causing arm pain.  His surgeon at [facility] noted above would like to know if his heart is healthy enough to have a right AV fistula at the same time of his left AV fistula.  The plan is ultimately to tie off the left AV fistula.  He does have the murmur of aortic stenosis.  He will need an echocardiogram prior to this being done.  I suspect his heart will be healthy enough for 2 physicians at the same time but we will see what his LV function looks like.  He did have a left heart catheterization in 2014 that was normal.  He is able to walk up to 5-1/2 miles 3 days/week without any symptoms of chest pain or shortness of breath.  I suspect his heart would be healthy male for this will make sure he has no significant progression of valvular heart disease.  He has a murmur of aortic stenosis.  It is early peaking so I suspect this is mild to moderate.  We will just make sure.  I will see him back in 1 month and will make final recommendations at that time.  3. Hypertension secondary to other renal disorders -Control with dialysis.  Disposition: Return in  about 1 month (around 06/07/2020).  Medication Adjustments/Labs and Tests Ordered: Current medicines are reviewed at length with the patient today.  Concerns regarding medicines are outlined above.  Orders Placed This Encounter  Procedures  . ECHOCARDIOGRAM COMPLETE   No orders of the defined types were placed in this encounter.   Patient Instructions  Medication Instructions:  The current medical regimen is effective;  continue present plan and medications.  *If you need a refill on your cardiac medications before your next appointment, please call your pharmacy*   Testing/Procedures: Echocardiogram (before 1 month appointment) - Your physician has requested that you have an echocardiogram. Echocardiography is a painless test that uses sound waves to create images of your heart. It provides your doctor with information about the size and shape of your heart and how well your heart's chambers and valves are working. This procedure takes approximately one hour. There are no restrictions for this procedure.  Follow-Up: At [facility], you and your health needs are our priority.  As part of our continuing mission to provide you with exceptional heart care, we have created designated Provider Care Teams.  These Care Teams include your primary Cardiologist (physician) and Advanced Practice Providers (APPs -  Physician Assistants and Nurse Practitioners) who all work together to provide you with the care you need, when you need it.  We recommend signing up for the patient portal called "MyChart".  Sign up information is provided on this After Visit Summary.  MyChart is used to connect with patients for Virtual Visits (Telemedicine).  Patients are able to view lab/test results, encounter notes, upcoming appointments, etc.  Non-urgent messages can be sent to your provider as well.   To learn more about what you can do with MyChart, go to [email or web address].    Your next appointment:   1  month(s)  The format for your next appointment:   In Person  Provider:   Eleonore Chiquito, MD        Signed, Addison Naegeli. Audie Box, [place]  [address], [place], [place] [address] [phone]  05/07/2020 8:57 AM

## 2020-05-07 ENCOUNTER — Other Ambulatory Visit: Payer: Self-pay

## 2020-05-07 ENCOUNTER — Ambulatory Visit (INDEPENDENT_AMBULATORY_CARE_PROVIDER_SITE_OTHER): Payer: Medicare Other | Admitting: Cardiovascular Disease

## 2020-05-07 ENCOUNTER — Encounter: Payer: Self-pay | Admitting: Cardiovascular Disease

## 2020-05-07 VITALS — BP 126/82 | HR 73 | Ht 75.0 in | Wt 226.4 lb

## 2020-05-07 DIAGNOSIS — I151 Hypertension secondary to other renal disorders: Secondary | ICD-10-CM

## 2020-05-07 DIAGNOSIS — R011 Cardiac murmur, unspecified: Secondary | ICD-10-CM | POA: Diagnosis not present

## 2020-05-07 DIAGNOSIS — N2889 Other specified disorders of kidney and ureter: Secondary | ICD-10-CM | POA: Diagnosis not present

## 2020-05-07 DIAGNOSIS — Z01810 Encounter for preprocedural cardiovascular examination: Secondary | ICD-10-CM | POA: Diagnosis not present

## 2020-05-07 NOTE — Patient Instructions (Signed)
Medication Instructions:  The current medical regimen is effective;  continue present plan and medications.  *If you need a refill on your cardiac medications before your next appointment, please call your pharmacy*   Testing/Procedures: Echocardiogram (before 1 month appointment) - Your physician has requested that you have an echocardiogram. Echocardiography is a painless test that uses sound waves to create images of your heart. It provides your doctor with information about the size and shape of your heart and how well your heart's chambers and valves are working. This procedure takes approximately one hour. There are no restrictions for this procedure.   Follow-Up: At [facility] you and your health needs are our priority.  As part of our continuing mission to provide you with exceptional heart care, we have created designated Provider Care Teams.  These Care Teams include your primary Cardiologist (physician) and Advanced Practice Providers (APPs -  Physician Assistants and Nurse Practitioners) who all work together to provide you with the care you need, when you need it.  We recommend signing up for the patient portal called "MyChart".  Sign up information is provided on this After Visit Summary.  MyChart is used to connect with patients for Virtual Visits (Telemedicine).  Patients are able to view lab/test results, encounter notes, upcoming appointments, etc.  Non-urgent messages can be sent to your provider as well.   To learn more about what you can do with MyChart, go to [email or web address].    Your next appointment:   1 month(s)  The format for your next appointment:   In Person  Provider:   Eleonore Chiquito, MD

## 2020-05-15 NOTE — Addendum Note (Signed)
Addended by: Hinton Dyer on: 05/15/2020 01:27 PM   Modules accepted: Orders

## 2020-05-26 ENCOUNTER — Ambulatory Visit (HOSPITAL_COMMUNITY): Payer: Medicare Other | Attending: Cardiology

## 2020-05-26 ENCOUNTER — Other Ambulatory Visit: Payer: Self-pay

## 2020-05-26 DIAGNOSIS — R011 Cardiac murmur, unspecified: Secondary | ICD-10-CM | POA: Diagnosis present

## 2020-05-27 ENCOUNTER — Telehealth: Payer: Self-pay | Admitting: Cardiovascular Disease

## 2020-05-27 LAB — ECHOCARDIOGRAM COMPLETE
AR max vel: 1.57 cm2
AV Area VTI: 1.63 cm2
AV Area mean vel: 1.54 cm2
AV Mean grad: 19 mmHg
AV Peak grad: 34 mmHg
Ao pk vel: 2.92 m/s
Area-P 1/2: 2.62 cm2
S' Lateral: 3.2 cm

## 2020-05-27 NOTE — Telephone Encounter (Signed)
Patient is requesting results from echocardiogram completed on 05/26/20. Please call to discuss.

## 2020-05-27 NOTE — Telephone Encounter (Signed)
Called Mr. Skalicky. He has moderate aortic stenosis. Normal LV function. He may proceed with surgery. Will send. Dr. Nicola Girt a message.   [name], [place]  [address], [place], [place] [address] [phone]  10:18 AM

## 2020-05-27 NOTE — Telephone Encounter (Signed)
Called and spoke with pt, notified that Dr.O'Neal had not resulted his echo yet. Pt upset on the phone, states this needs to be done as soon as possible. He reports he needs a new fistula and that he isn't getting his dialysis right now so he needs the results for his echo. Notified that Dr.O'Neal is in office today so I would send this message to him to make him aware. Notified as soon as it was resulted we would let him know. Pt verbalized understanding with no other questions at this time.

## 2020-05-27 NOTE — Telephone Encounter (Signed)
Patient was contacted and given results by Dr.O'Neal.

## 2020-05-31 NOTE — Progress Notes (Deleted)
Cardiology Office Note:   Date:  05/31/2020  NAME:  [name]    MRN: [id number] DOB:  1971-05-13   PCP:  Joesph July, MD  Cardiologist:  No primary care provider on file.  Electrophysiologist:  None   Referring MD: Danna Hefty, DO   No chief complaint on file. ***  History of Present Illness:   [name] is a 49 y.o. male with a hx of ESRD, HTN, moderate aortic stenosis who presents for follow-up. He was seen 1 month ago for evaluation of murmur. Echo confirms moderate AS. Tricuspid valve. Suspect progression in setting of ESRD. Will have second AV fistula placed.   Problem list 1. 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 HTN 3. Perianal condyloma  4. Moderate Aortic Stenosis  -05/2020 -Tricuspid AoV -V max 3.1 m/s, MG 21 mmHG, AVA 1.5 cm2  Past Medical History: Past Medical History:  Diagnosis Date  . ADHD (attention deficit hyperactivity disorder)   . Anemia   . Anemia, chronic disease 07/13/2011  . Anxiety   . Arthritis   . Bipolar 2 disorder ([place]) 08/18/2011   Diagnosed in 2011.   . Bipolar disorder ([place])   . Blood transfusion   . Blood transfusion without reported diagnosis   . Chronic diastolic CHF (congestive heart failure) ([place])   . Complication of anesthesia    Woke up intubated gets combative  afraid to be alone   . Congestive heart failure (CHF) ([place]) 07/13/2011   Onset 2006.  Followed by Dr. Johnsie Cancel.  S/p cardiac catheterization in [facility] Dr. Marlou Porch..  S/p cardiac catheterization in 2014 by Nishan.  Echo 2014.   . Depression   . Erectile dysfunction   . ESRD on hemodialysis ([place]) 07/13/2011   Glomerulonephritis at age 57, started HD in 73.  Deceased donor renal transplant 1999 at [facility], then kidney failed and went back on HD in 2006.  L forearm AVF . s/p repeat renal transplant 10/2014 [facility].  . GI bleed 11/01/2011   Rectal bleeding with emesis/diarrhea   . Hemodialysis  status ([place])   . Hydrocele, right 11/25/2014  . Hypertension   . Hypertensive emergency 01/06/2013  . Hypoglycemia 01/11/2013  . Influenza-like illness 12/18/2013  . Mild ascending aorta dilatation (HCC)   . NICM (nonischemic cardiomyopathy) ([place])   . Pericardial effusion    a. Mod by echo in 2014, similar to prior.  . Peripheral polyneuropathy 08/04/2016  . Pulmonary edema 01/06/2013  . Pulmonary hypertension associated with end stage renal disease on dialysis ([place]) 07/13/2011  . Renal transplant, status post 11/25/2014   Pt with Glomerulonephritis at age 73. Deceased donor renal transplant 1999 right and left renal transplant 10/2014.  Followed by [facility] transplant team; nephrologist is Dr. Barbette Merino.   . Respiratory failure with hypoxia ([place]) 01/06/2013  . Shortness of breath 12/12/2012  . Swollen testicle 07/13/2011  . URI (upper respiratory infection) 01/25/2012    Past Surgical History: Past Surgical History:  Procedure Laterality Date  . ANGIOPLASTY    . CHOLECYSTECTOMY    . DIALYSIS FISTULA CREATION    . EVALUATION UNDER ANESTHESIA WITH HEMORRHOIDECTOMY N/A 03/15/2020   Procedure: CONTROL OF ANAL BLEEDING;  Surgeon: Ileana Roup, MD;  Location: [place];  Service: General;  Laterality: N/A;  . KIDNEY TRANSPLANT  10/17/14  . KNEE SURGERY    . LEFT HEART CATHETERIZATION WITH CORONARY ANGIOGRAM N/A 10/01/2013   Procedure: LEFT HEART CATHETERIZATION WITH  CORONARY ANGIOGRAM;  Surgeon: [name], MD;  Location: [facility] CATH LAB;  Service: Cardiovascular;  Laterality: N/A;  . MOUTH SURGERY    . NEPHRECTOMY TRANSPLANTED ORGAN      Current Medications: No outpatient medications have been marked as taking for the 06/02/20 encounter (Appointment) with O'Neal, Cassie Freer, MD.     Allergies:    Delacort [hydrocortisone base]; Benicar [olmesartan]; Food; Hydralazine; and Nitrates, organic   Social History: Social History   Socioeconomic History  . Marital status: Single    Spouse  name: Not on file  . Number of children: 0  . Years of education: 52+  . Highest education level: Not on file  Occupational History  . Occupation: Chef- fulltime  Tobacco Use  . Smoking status: Former Smoker    Years: 2.00    Types: Cigars  . Smokeless tobacco: Never Used  Vaping Use  . Vaping Use: Never used  Substance and Sexual Activity  . Alcohol use: No    Alcohol/week: 1.0 standard drink    Types: 1 Standard drinks or equivalent per week    Comment: Stopped drinking after Kidney transplant in December 2015  . Drug use: No  . Sexual activity: Yes  Other Topics Concern  . 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      Tobacco: none      Alcohol: none      Drugs: none      Exercise: cycling; 3 days per week.      Sexual activity: females only; no male encounters; Chlamydia age 54.  Total partner = 55.  Condoms.      Caffeine use: Soda sometimes   Social Determinants of Radio broadcast assistant Strain:   . Difficulty of Paying Living Expenses:   Food Insecurity:   . Worried About Charity fundraiser in the Last Year:   . Arboriculturist in the Last Year:   Transportation Needs:   . Film/video editor (Medical):   Marland Kitchen Lack of Transportation (Non-Medical):   Physical Activity:   . Days of Exercise per Week:   . Minutes of Exercise per Session:   Stress:   . Feeling of Stress :   Social Connections:   . Frequency of Communication with Friends and Family:   . Frequency of Social Gatherings with Friends and Family:   . Attends Religious Services:   . Active Member of Clubs or Organizations:   . Attends Archivist Meetings:   Marland Kitchen Marital Status:      Family History: The patient's ***family history includes Arthritis in his father and mother; Depression in his father and mother;  Diabetes in his father and mother; Hypertension in his father and mother.  ROS:   All other ROS reviewed and negative. Pertinent positives noted in the HPI.     EKGs/Labs/Other Studies Reviewed:   The following studies were personally reviewed by me today:  EKG:  EKG is *** ordered today.  The ekg ordered today demonstrates ***, and was personally reviewed by me.   TTE 05/26/2020 1. Left ventricular ejection fraction, by estimation, is 60 to 65%. Left  ventricular ejection fraction by 3D volume is 60 %. The left ventricle has  normal function. The left  ventricle has no regional wall motion  abnormalities. There is moderate  asymmetric left ventricular hypertrophy of the basal-septal segment. Left  ventricular diastolic parameters are consistent with Grade II diastolic  dysfunction (pseudonormalization). Elevated left atrial pressure. The  average left ventricular global  longitudinal strain is -20.2 %.  2. Right ventricular systolic function is normal. The right ventricular  size is mildly enlarged. There is mildly elevated pulmonary artery  systolic pressure. The estimated right ventricular systolic pressure is  67.6 mmHg.  3. Left atrial size was moderately dilated.  4. The mitral valve is normal in structure. No evidence of mitral valve  regurgitation.  5. Possible PFO.  6. Aortic dilatation noted. There is mild dilatation of the ascending  aorta measuring 38 mm.  7. The inferior vena cava is dilated in size with >50% respiratory  variability, suggesting right atrial pressure of 8 mmHg.  8. The aortic valve is tricuspid. Moderately calcified. Aortic valve  regurgitation is trivial. Moderate aortic valve stenosis. Vmax 3.1 m/s, MG  21 mmHg, AVA 1.5 cm^2, DI 0.32   Recent Labs: 03/15/2020: Hemoglobin 9.1; Platelets 123 03/16/2020: ALT 10; BUN 21; Creatinine, Ser 6.88; Potassium 4.1; Sodium 136   Recent Lipid Panel    Component Value Date/Time   CHOL  11/04/2007 0424     83        ATP III CLASSIFICATION:  <200     mg/dL   Desirable  200-239  mg/dL   Borderline High  >=240    mg/dL   High   TRIG 60 11/04/2007 0424   HDL 20 (L) 11/04/2007 0424   CHOLHDL 4.2 11/04/2007 0424   VLDL 12 11/04/2007 0424   LDLCALC  11/04/2007 0424    51        Total Cholesterol/HDL:CHD Risk Coronary Heart Disease Risk Table                     Men   Women  1/2 Average Risk   3.4   3.3    Physical Exam:   VS:  There were no vitals taken for this visit.   Wt Readings from Last 3 Encounters:  05/07/20 226 lb 6.4 oz (102.7 kg)  03/16/20 229 lb 0.9 oz (103.9 kg)  03/15/20 220 lb (99.8 kg)    General: Well nourished, well developed, in no acute distress Heart: Atraumatic, normal size  Eyes: PEERLA, EOMI  Neck: Supple, no JVD Endocrine: No thryomegaly Cardiac: Normal S1, S2; RRR; no murmurs, rubs, or gallops Lungs: Clear to auscultation bilaterally, no wheezing, rhonchi or rales  Abd: Soft, nontender, no hepatomegaly  Ext: No edema, pulses 2+ Musculoskeletal: No deformities, BUE and BLE strength normal and equal Skin: Warm and dry, no rashes   Neuro: Alert and oriented to person, place, time, and situation, CNII-XII grossly intact, no focal deficits  Psych: Normal mood and affect   ASSESSMENT:   Jumar Greenstreet is a 49 y.o. male who presents for the following: No diagnosis found.  PLAN:   There are no diagnoses linked to this encounter.  Disposition: No follow-ups on file.  Medication Adjustments/Labs and Tests Ordered: Current medicines are reviewed at length with the patient today.  Concerns regarding medicines are outlined above.  No orders of the defined types were placed in this encounter.  No orders of the defined types were placed in this encounter.   There are no Patient Instructions on file for this visit.   Time Spent with Patient: I have spent  a total of *** minutes with patient reviewing hospital notes, telemetry, EKGs, labs and examining the patient  as well as establishing an assessment and plan that was discussed with the patient.  > 50% of time was spent in direct patient care.  Signed, Addison Naegeli. Audie Box, [place]  [address] [place], [place] [address] [phone]  05/31/2020 6:37 PM

## 2020-06-02 ENCOUNTER — Ambulatory Visit: Payer: Medicare Other | Admitting: Cardiovascular Disease

## 2020-06-04 ENCOUNTER — Telehealth: Payer: Self-pay | Admitting: Cardiovascular Disease

## 2020-06-04 NOTE — Telephone Encounter (Signed)
Shirlean Mylar is calling from [facility] requesting [name]'s most recent EKG results performed on 05/07/20 be faxed to their office. The fax number is [phone]. Please advise.

## 2020-07-13 NOTE — Progress Notes (Signed)
Cardiology Office Note:   Date:  07/16/2020  NAME:  [name]    MRN: [id number] DOB:  [birth year]   PCP:  Joesph July, MD  Cardiologist:  No primary care provider on file.   Referring MD: Danna Hefty, DO   Chief Complaint  Patient presents with  . Aortic Stenosis   History of Present Illness:   [name] is a 49 y.o. male with a hx of ESRD s/p failed transplant on HD, HTN, moderate AS who presents for follow-up.  He recently underwent right upper arm AV fistula placement at [facility].  His left upper extremity AV fistula is causing him left arm pain.  He is waiting for that fistula in the right arm to mature and then he will take dialysis through that arm.  They will then tie off the left arm fistula.  He tolerated surgery well.  He seems to be doing fine from a cardiovascular standpoint we did go over the results of his echocardiogram which shows moderate aortic stenosis.  Given his young age this will need to be addressed at some point.  He does not in that range yet.  I have asked him to incorporate some exercise into his program.  Blood pressure was unable to be obtained.  He is on dialysis with 2 upper extremity AV fistulas.  We did try in the lower extremities but were unsuccessful.  He denies chest pain, shortness of breath or palpitations today.  Overall seems to be doing well.  He does report his blood pressure is controlled at dialysis.    Problem list 1. ESRD -s/p failed kidney transplant  -Left heart catheterization 2014 with normal coronary arteries 2. HTN 3. Perianal condyloma  4. Moderate aortic stenosis -V max 3.1 m/s, MG 21 mmHG, AVA 1.5 cm2 05/2020  Past Medical History: Past Medical History:  Diagnosis Date  . ADHD (attention deficit hyperactivity disorder)   . Anemia   . Anemia, chronic disease 07/13/2011  . Anxiety   . Arthritis   . Bipolar 2 disorder ([place]) 08/18/2011   Diagnosed in 2011.   . Bipolar disorder ([place])   . Blood transfusion   .  Blood transfusion without reported diagnosis   . Chronic diastolic CHF (congestive heart failure) ([place])   . Complication of anesthesia    Woke up intubated gets combative  afraid to be alone   . Congestive heart failure (CHF) ([place]) 07/13/2011   Onset 2006.  Followed by Dr. Johnsie Cancel.  S/p cardiac catheterization in [facility] Dr. Marlou Porch..  S/p cardiac catheterization in 2014 by Nishan.  Echo 2014.   . Depression   . Erectile dysfunction   . ESRD on hemodialysis ([place]) 07/13/2011   Glomerulonephritis at age 17, started HD in 10.  Deceased donor renal transplant 1999 at [facility], then kidney failed and went back on HD in 2006.  L forearm AVF . s/p repeat renal transplant 10/2014 [facility].  . GI bleed 11/01/2011   Rectal bleeding with emesis/diarrhea   . Hemodialysis status ([place])   . Hydrocele, right 11/25/2014  . Hypertension   . Hypertensive emergency 01/06/2013  . Hypoglycemia 01/11/2013  . Influenza-like illness 12/18/2013  . Mild ascending aorta dilatation (HCC)   . NICM (nonischemic cardiomyopathy) ([place])   . Pericardial effusion    a. Mod by echo in 2014, similar to prior.  . Peripheral polyneuropathy 08/04/2016  . Pulmonary edema 01/06/2013  . 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  Renal transplant, status post 11/25/2014   Pt with Glomerulonephritis at age 47. Deceased donor renal transplant 1999 right and left renal transplant 10/2014.  Followed by [facility] transplant team; nephrologist is Dr. Barbette Merino.   . Respiratory failure with hypoxia ([place]) 01/06/2013  . Shortness of breath 12/12/2012  . Swollen testicle 07/13/2011  . URI (upper respiratory infection) 01/25/2012    Past Surgical History: Past Surgical History:  Procedure Laterality Date  . ANGIOPLASTY    . CHOLECYSTECTOMY    . DIALYSIS FISTULA CREATION    . EVALUATION UNDER ANESTHESIA WITH HEMORRHOIDECTOMY N/A 03/15/2020   Procedure: CONTROL OF ANAL BLEEDING;  Surgeon: Ileana Roup, MD;  Location: [place];  Service: General;  Laterality: N/A;  . KIDNEY TRANSPLANT  10/17/14  . KNEE SURGERY    . LEFT HEART CATHETERIZATION WITH CORONARY ANGIOGRAM N/A 10/01/2013   Procedure: LEFT HEART CATHETERIZATION WITH CORONARY ANGIOGRAM;  Surgeon: [name], MD;  Location: [facility] CATH LAB;  Service: Cardiovascular;  Laterality: N/A;  . MOUTH SURGERY    . NEPHRECTOMY TRANSPLANTED ORGAN      Current Medications: Current Meds  Medication Sig  . acyclovir (ZOVIRAX) 200 MG capsule Take 200 mg by mouth See admin instructions. 200mg  by mouth twice daily. Take and additional 200mg  capsule by mouth after dialysis on dialysis days.  Marland Kitchen amphetamine-dextroamphetamine (ADDERALL XR) 20 MG 24 hr capsule Take 20 mg by mouth daily.  Marland Kitchen amphetamine-dextroamphetamine (ADDERALL) 10 MG tablet Take 10 mg by mouth daily.   . ARIPiprazole (ABILIFY) 20 MG tablet Take 20 mg by mouth daily.  Marland Kitchen aspirin EC 81 MG tablet Take 81 mg by mouth daily.  . calcitRIOL (ROCALTROL) 0.25 MCG capsule Take 0.25 mcg by mouth daily.  . calcium carbonate (OS-CAL) 1250 (500 Ca) MG chewable tablet Chew 1 tablet by mouth daily.  . carvedilol (COREG) 25 MG tablet Take 25 mg by mouth 2 (two) times daily with a meal. Patient I taking 50mg  two times daily.  . cefdinir (OMNICEF) 300 MG capsule cefdinir 300 mg capsule  TAKE 1 CAPSULE BY MOUTH EVERY OTHER DAY.  . clobetasol ointment (TEMOVATE) 0.05 % APPLY ONCE DAILY TO BLISTERS ON ARM  . clonazePAM (KLONOPIN) 1 MG tablet Take 0.3 mg by mouth daily.   Marland Kitchen HYDROcodone-acetaminophen (NORCO/VICODIN) 5-325 MG per tablet Take 1 tablet by mouth every 6 (six) hours as needed for moderate pain.  Marland Kitchen imiquimod (ALDARA) 5 % cream SMARTSIG:1 Topical Every Night  . lidocaine-prilocaine (EMLA) cream Apply topically.  . silver sulfADIAZINE (SILVADENE) 1 % cream Apply 1 application topically in the morning and at bedtime. To open sores.     Allergies:    Delacort [hydrocortisone base];  Benicar [olmesartan]; Food; Hydralazine; and Nitrates, organic   Social History: Social History   Socioeconomic History  . Marital status: Single    Spouse name: Not on file  . Number of children: 0  . Years of education: 56+  . Highest education level: Not on file  Occupational History  . Occupation: Chef- fulltime  Tobacco Use  . Smoking status: Former Smoker    Years: 2.00    Types: Cigars  . Smokeless tobacco: Never Used  Vaping Use  . Vaping Use: Never used  Substance and Sexual Activity  . Alcohol use: No    Alcohol/week: 1.0 standard drink    Types: 1 Standard drinks or equivalent per week    Comment: Stopped drinking after Kidney transplant in December 2015  . Drug use: No  . Sexual  activity: Yes  Other Topics Concern  . 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      Tobacco: none      Alcohol: none      Drugs: none      Exercise: cycling; 3 days per week.      Sexual activity: females only; no male encounters; Chlamydia age 49.  Total partner = 55.  Condoms.      Caffeine use: Soda sometimes   Social Determinants of Radio broadcast assistant Strain:   . Difficulty of Paying Living Expenses: Not on file  Food Insecurity:   . Worried About Charity fundraiser in the Last Year: Not on file  . Ran Out of Food in the Last Year: Not on file  Transportation Needs:   . Lack of Transportation (Medical): Not on file  . Lack of Transportation (Non-Medical): Not on file  Physical Activity:   . Days of Exercise per Week: Not on file  . Minutes of Exercise per Session: Not on file  Stress:   . Feeling of Stress : Not on file  Social Connections:   . Frequency of Communication with Friends and Family: Not on file  . Frequency of Social Gatherings with Friends and Family: Not on file  . Attends  Religious Services: Not on file  . Active Member of Clubs or Organizations: Not on file  . Attends Archivist Meetings: Not on file  . Marital Status: Not on file     Family History: The patient's family history includes Arthritis in his father and mother; Depression in his father and mother; Diabetes in his father and mother; Hypertension in his father and mother.  ROS:   All other ROS reviewed and negative. Pertinent positives noted in the HPI.     EKGs/Labs/Other Studies Reviewed:   The following studies were personally reviewed by me today:  Recent Labs: 03/15/2020: Hemoglobin 9.1; Platelets 123 03/16/2020: ALT 10; BUN 21; Creatinine, Ser 6.88; Potassium 4.1; Sodium 136   Recent Lipid Panel    Component Value Date/Time   CHOL  11/04/2007 0424    83        ATP III CLASSIFICATION:  <200     mg/dL   Desirable  200-239  mg/dL   Borderline High  >=240    mg/dL   High   TRIG 60 11/04/2007 0424   HDL 20 (L) 11/04/2007 0424   CHOLHDL 4.2 11/04/2007 0424   VLDL 12 11/04/2007 0424   LDLCALC  11/04/2007 0424    51        Total Cholesterol/HDL:CHD Risk Coronary Heart Disease Risk Table                     Men   Women  1/2 Average Risk   3.4   3.3    Physical Exam:   VS:  Pulse 75   Ht 6\' 3"  (1.905 m)   Wt 218 lb 6.4 oz (99.1 kg)   SpO2 99%   BMI 27.30 kg/m    Wt Readings from Last 3 Encounters:  07/16/20 218 lb 6.4 oz (99.1 kg)  05/07/20 226 lb 6.4 oz (102.7 kg)  03/16/20 229 lb 0.9 oz (103.9 kg)    General:  Well nourished, well developed, in no acute distress Heart: Atraumatic, normal size  Eyes: PEERLA, EOMI  Neck: Supple, no JVD Endocrine: No thryomegaly Cardiac: Normal S1, S2; RRR; 3-6 early peaking systolic ejection murmur Lungs: Clear to auscultation bilaterally, no wheezing, rhonchi or rales  Abd: Soft, nontender, no hepatomegaly  Ext: No edema, pulses 2+ upper extremity AV fistula Musculoskeletal: No deformities, BUE and BLE strength normal and  equal Skin: Warm and dry, no rashes   Neuro: Alert and oriented to person, place, time, and situation, CNII-XII grossly intact, no focal deficits  Psych: Normal mood and affect   ASSESSMENT:   Erric Machnik is a 49 y.o. male who presents for the following: 1. Nonrheumatic aortic valve stenosis   2. Essential hypertension     PLAN:   1. Nonrheumatic aortic valve stenosis -Recent echocardiogram with moderate aortic stenosis.  Early peaking murmur on exam.  No symptoms.  We will plan to see him on a yearly basis.  I will repeat an echocardiogram in 1 to 2 years.  2. Essential hypertension -Difficult to obtain his blood pressure.  Reports it is controlled at dialysis.  We will keep an eye on it.  Mainly controlled with hemodialysis.  Disposition: Return in about 1 year (around 07/16/2021).  Medication Adjustments/Labs and Tests Ordered: Current medicines are reviewed at length with the patient today.  Concerns regarding medicines are outlined above.  No orders of the defined types were placed in this encounter.  No orders of the defined types were placed in this encounter.   Patient Instructions  Medication Instructions:  The current medical regimen is effective;  continue present plan and medications.  *If you need a refill on your cardiac medications before your next appointment, please call your pharmacy*  Follow-Up: At [facility], you and your health needs are our priority.  As part of our continuing mission to provide you with exceptional heart care, we have created designated Provider Care Teams.  These Care Teams include your primary Cardiologist (physician) and Advanced Practice Providers (APPs -  Physician Assistants and Nurse Practitioners) who all work together to provide you with the care you need, when you need it.  We recommend signing up for the patient portal called "MyChart".  Sign up information is provided on this After Visit Summary.  MyChart is used to connect with  patients for Virtual Visits (Telemedicine).  Patients are able to view lab/test results, encounter notes, upcoming appointments, etc.  Non-urgent messages can be sent to your provider as well.   To learn more about what you can do with MyChart, go to [email or web address].    Your next appointment:   12 month(s)  The format for your next appointment:   In Person  Provider:   Eleonore Chiquito, MD       Time Spent with Patient: I have spent a total of 25 minutes with patient reviewing hospital notes, telemetry, EKGs, labs and examining the patient as well as establishing an assessment and plan that was discussed with the patient.  > 50% of time was spent in direct patient care.  Signed, Addison Naegeli. Audie Box, [place]  [address] [place], [place] [address] [phone]  07/16/2020 9:23 AM

## 2020-07-16 ENCOUNTER — Encounter: Payer: Self-pay | Admitting: Cardiovascular Disease

## 2020-07-16 ENCOUNTER — Ambulatory Visit (INDEPENDENT_AMBULATORY_CARE_PROVIDER_SITE_OTHER): Payer: Medicare Other | Admitting: Cardiovascular Disease

## 2020-07-16 ENCOUNTER — Other Ambulatory Visit: Payer: Self-pay

## 2020-07-16 VITALS — BP 140/70 | HR 75 | Ht 75.0 in | Wt 218.4 lb

## 2020-07-16 DIAGNOSIS — I10 Essential (primary) hypertension: Secondary | ICD-10-CM

## 2020-07-16 DIAGNOSIS — I350 Nonrheumatic aortic (valve) stenosis: Secondary | ICD-10-CM

## 2020-07-16 NOTE — Patient Instructions (Signed)

## 2020-10-15 ENCOUNTER — Other Ambulatory Visit: Payer: Self-pay

## 2020-10-15 ENCOUNTER — Ambulatory Visit (INDEPENDENT_AMBULATORY_CARE_PROVIDER_SITE_OTHER): Payer: Medicare Other | Admitting: Cardiovascular Disease

## 2020-10-15 ENCOUNTER — Telehealth: Payer: Self-pay | Admitting: Radiology

## 2020-10-15 ENCOUNTER — Encounter: Payer: Self-pay | Admitting: Cardiovascular Disease

## 2020-10-15 VITALS — HR 71 | Temp 97.3°F | Ht 75.0 in | Wt 218.6 lb

## 2020-10-15 DIAGNOSIS — I350 Nonrheumatic aortic (valve) stenosis: Secondary | ICD-10-CM | POA: Diagnosis not present

## 2020-10-15 DIAGNOSIS — R002 Palpitations: Secondary | ICD-10-CM | POA: Diagnosis not present

## 2020-10-15 DIAGNOSIS — I10 Essential (primary) hypertension: Secondary | ICD-10-CM

## 2020-10-15 NOTE — Patient Instructions (Addendum)
Medication Instructions:  No Changes In Medications at this time.  *If you need a refill on your cardiac medications before your next appointment, please call your pharmacy*  Lab Work: TSH- please have this drawn  If you have labs (blood work) drawn today and your tests are completely normal, you will receive your results only by: Marland Kitchen MyChart Message (if you have MyChart) OR . A paper copy in the mail If you have any lab test that is abnormal or we need to change your treatment, we will call you to review the results.  Testing/Procedures: Your physician has requested that you have an echocardiogram in 6 months . Echocardiography is a painless test that uses sound waves to create images of your heart. It provides your doctor with information about the size and shape of your heart and how well your heart's chambers and valves are working. You may receive an ultrasound enhancing agent through an IV if needed to better visualize your heart during the echo.This procedure takes approximately one hour. There are no restrictions for this procedure. This will take place at the [address], [address].   ZIO XT- Long Term Monitor Instructions   Your physician has requested you wear your ZIO patch monitor 3 days.   This is a single patch monitor.  Irhythm supplies one patch monitor per enrollment.  Additional stickers are not available.   Please do not apply patch if you will be having a Nuclear Stress Test, Echocardiogram, Cardiac CT, MRI, or Chest Xray during the time frame you would be wearing the monitor. The patch cannot be worn during these tests.  You cannot remove and re-apply the ZIO XT patch monitor.   Your ZIO patch monitor will be sent USPS Priority mail from [facility] directly to your home address. The monitor may also be mailed to a PO BOX if home delivery is not available.   It may take 3-5 days to receive your monitor after you have been enrolled.   Once you have received  you monitor, please review enclosed instructions.  Your monitor has already been registered assigning a specific monitor serial # to you.   Applying the monitor   Shave hair from upper left chest.   Hold abrader disc by orange tab.  Rub abrader in 40 strokes over left upper chest as indicated in your monitor instructions.   Clean area with 4 enclosed alcohol pads .  Use all pads to assure are is cleaned thoroughly.  Let dry.   Apply patch as indicated in monitor instructions.  Patch will be place under collarbone on left side of chest with arrow pointing upward.   Rub patch adhesive wings for 2 minutes.Remove white label marked "1".  Remove white label marked "2".  Rub patch adhesive wings for 2 additional minutes.   While looking in a mirror, press and release button in center of patch.  A small green light will flash 3-4 times .  This will be your only indicator the monitor has been turned on.     Do not shower for the first 24 hours.  You may shower after the first 24 hours.   Press button if you feel a symptom. You will hear a small click.  Record Date, Time and Symptom in the Patient Log Book.   When you are ready to remove patch, follow instructions on last 2 pages of Patient Log Book.  Stick patch monitor onto last page of Patient Log Book.  Place Patient Log Book in [place] box.  Use locking tab on box and tape box closed securely.  The [employer] has [employer] on it.  Please place in mailbox as soon as possible.  Your physician should have your test results approximately 7 days after the monitor has been mailed back to [facility].   Call [place] at [phone] if you have questions regarding your ZIO XT patch monitor.  Call them immediately if you see an orange light blinking on your monitor.   If your monitor falls off in less than 4 days contact our Monitor department at [phone].  If your monitor becomes loose or falls off after 4  days call [employer] at [phone] for suggestions on securing your monitor.    Follow-Up: At [facility], you and your health needs are our priority.  As part of our continuing mission to provide you with exceptional heart care, we have created designated Provider Care Teams.  These Care Teams include your primary Cardiologist (physician) and Advanced Practice Providers (APPs -  Physician Assistants and Nurse Practitioners) who all work together to provide you with the care you need, when you need it.  Your next appointment:   6 month(s)  The format for your next appointment:   In Person  Provider:   Eleonore Chiquito, MD

## 2020-10-15 NOTE — Telephone Encounter (Signed)
Enrolled patient for a 3 day Zio XT Monitor to be mailed to patients home.  °

## 2020-10-15 NOTE — Progress Notes (Signed)
Cardiology Office Note:   Date:  10/15/2020  NAME:  [name]    MRN: [id number] DOB:  11-17-1970   PCP:  Joesph July, MD  Cardiologist:  No primary care provider on file.   Referring MD: Joesph July, MD   Chief Complaint  Patient presents with  . Palpitations   History of Present Illness:   [name] is a 49 y.o. male with a hx of ESRD status post failed kidney transplant, moderate aortic stenosis, hypertension who presents for palpitations.  He reports over the last 2 months has had episodes of heart racing.  Symptoms are occurring daily.  She reports they can occur anytime.  No identifiable trigger.  No alleviating factor.  He is still working and on his feet as much as he can be.  Symptoms do not occur that much with exertion.  He reports the symptoms can last 5 to 10 minutes and resolved.  He has noticed that reduction in caffeine has improved his symptoms of palpitations.  He is cut back on [facility] with some improvement.  His EKG today shows normal sinus rhythm with LVH.  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  He reports blood pressures have been okay at home.  He did have a right AV fistula placed but will have to have a revision.  No change in chest pain or shortness of breath.  He is having none of the symptoms.  He reports he will have surgery done on his rectum coming up.  No major issues with this.  It will be minor surgery.  He denies any chest pain, shortness of breath in office today.  I also expressed that we need to check a TSH on him.  This has to be done with dialysis.  We will give him a slip of paper for this.  Problem list 1. ESRD -s/p failed kidney transplant -Left heart catheterization 2014 with normal coronary arteries 2. HTN 3. Perianal condyloma 4. Moderate aortic stenosis -V max 3.1 m/s, MG 21 mmHG, AVA 1.5 cm2 05/2020  Past Medical History: Past Medical History:  Diagnosis Date  . ADHD (attention  deficit hyperactivity disorder)   . Anemia   . Anemia, chronic disease 07/13/2011  . Anxiety   . Arthritis   . Bipolar 2 disorder ([place]) 08/18/2011   Diagnosed in 2011.   . Bipolar disorder ([place])   . Blood transfusion   . Blood transfusion without reported diagnosis   . Chronic diastolic CHF (congestive heart failure) ([place])   . Complication of anesthesia    Woke up intubated gets combative  afraid to be alone   . Congestive heart failure (CHF) ([place]) 07/13/2011   Onset 2006.  Followed by Dr. Johnsie Cancel.  S/p cardiac catheterization in [facility] Dr. Marlou Porch..  S/p cardiac catheterization in 2014 by [facility].  Echo 2014.   . Depression   . Erectile dysfunction   . ESRD on hemodialysis ([place]) 07/13/2011   Glomerulonephritis at age 49, started HD in 29.  Deceased donor renal transplant 1999 at [facility], then kidney failed and went back on HD in 2006.  L forearm AVF . s/p repeat renal transplant 10/2014 [facility].  . GI bleed 11/01/2011   Rectal bleeding with emesis/diarrhea   . Hemodialysis status ([place])   . Hydrocele, right 11/25/2014  . Hypertension   . Hypertensive emergency 01/06/2013  . Hypoglycemia 01/11/2013  . Influenza-like illness 12/18/2013  . Mild ascending aorta dilatation (HCC)   .  NICM (nonischemic cardiomyopathy) ([place])   . Pericardial effusion    a. Mod by echo in 2014, similar to prior.  . Peripheral polyneuropathy 08/04/2016  . Pulmonary edema 01/06/2013  . Pulmonary hypertension associated with end stage renal disease on dialysis ([place]) 07/13/2011  . Renal transplant, status post 11/25/2014   Pt with Glomerulonephritis at age 24. Deceased donor renal transplant 1999 right and left renal transplant 10/2014.  Followed by [facility] transplant team; nephrologist is Dr. Barbette Merino.   . Respiratory failure with hypoxia ([place]) 01/06/2013  . Shortness of breath 12/12/2012  . Swollen testicle 07/13/2011  . URI (upper respiratory infection) 01/25/2012    Past Surgical History: Past Surgical History:   Procedure Laterality Date  . ANGIOPLASTY    . CHOLECYSTECTOMY    . DIALYSIS FISTULA CREATION    . EVALUATION UNDER ANESTHESIA WITH HEMORRHOIDECTOMY N/A 03/15/2020   Procedure: CONTROL OF ANAL BLEEDING;  Surgeon: Ileana Roup, MD;  Location: [facility];  Service: General;  Laterality: N/A;  . KIDNEY TRANSPLANT  10/17/14  . KNEE SURGERY    . LEFT HEART CATHETERIZATION WITH CORONARY ANGIOGRAM N/A 10/01/2013   Procedure: LEFT HEART CATHETERIZATION WITH CORONARY ANGIOGRAM;  Surgeon: [name], MD;  Location: [facility] CATH LAB;  Service: Cardiovascular;  Laterality: N/A;  . MOUTH SURGERY    . NEPHRECTOMY TRANSPLANTED ORGAN      Current Medications: Current Meds  Medication Sig  . acyclovir (ZOVIRAX) 200 MG capsule Take 200 mg by mouth See admin instructions. 200mg  by mouth twice daily. Take and additional 200mg  capsule by mouth after dialysis on dialysis days.  Marland Kitchen amphetamine-dextroamphetamine (ADDERALL XR) 20 MG 24 hr capsule Take 20 mg by mouth daily.  Marland Kitchen amphetamine-dextroamphetamine (ADDERALL) 10 MG tablet Take 10 mg by mouth daily.   . ARIPiprazole (ABILIFY) 20 MG tablet Take 20 mg by mouth daily.  Marland Kitchen aspirin EC 81 MG tablet Take 81 mg by mouth daily.  . carvedilol (COREG) 25 MG tablet Take 25 mg by mouth 2 (two) times daily with a meal. Patient I taking 50mg  two times daily.  . cefdinir (OMNICEF) 300 MG capsule cefdinir 300 mg capsule  TAKE 1 CAPSULE BY MOUTH EVERY OTHER DAY.  . clobetasol ointment (TEMOVATE) 0.05 % APPLY ONCE DAILY TO BLISTERS ON ARM  . clonazePAM (KLONOPIN) 1 MG tablet Take 0.3 mg by mouth daily.   Marland Kitchen HYDROcodone-acetaminophen (NORCO/VICODIN) 5-325 MG per tablet Take 1 tablet by mouth every 6 (six) hours as needed for moderate pain.  Marland Kitchen imiquimod (ALDARA) 5 % cream SMARTSIG:1 Topical Every Night  . lidocaine-prilocaine (EMLA) cream Apply topically.  . silver sulfADIAZINE (SILVADENE) 1 % cream Apply 1 application topically in the morning and at bedtime. To open sores.  .  [DISCONTINUED] calcitRIOL (ROCALTROL) 0.25 MCG capsule Take 0.25 mcg by mouth daily.  . [DISCONTINUED] calcium carbonate (OS-CAL) 1250 (500 Ca) MG chewable tablet Chew 1 tablet by mouth daily.     Allergies:    Delacort [hydrocortisone base]; Benicar [olmesartan]; Food; Hydralazine; and Nitrates, organic   Social History: Social History   Socioeconomic History  . Marital status: Single    Spouse name: Not on file  . Number of children: 0  . Years of education: 39+  . Highest education level: Not on file  Occupational History  . Occupation: Chef- fulltime  Tobacco Use  . Smoking status: Former Smoker    Years: 2.00    Types: Cigars  . Smokeless tobacco: Never Used  Vaping Use  . Vaping Use: Never  used  Substance and Sexual Activity  . Alcohol use: No    Alcohol/week: 1.0 standard drink    Types: 1 Standard drinks or equivalent per week    Comment: Stopped drinking after Kidney transplant in December 2015  . Drug use: No  . Sexual activity: Yes  Other Topics Concern  . 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      Tobacco: none      Alcohol: none      Drugs: none      Exercise: cycling; 3 days per week.      Sexual activity: females only; no male encounters; Chlamydia age 42.  Total partner = 55.  Condoms.      Caffeine use: Soda sometimes   Social Determinants of Radio broadcast assistant Strain: Not on file  Food Insecurity: Not on file  Transportation Needs: Not on file  Physical Activity: Not on file  Stress: Not on file  Social Connections: Not on file     Family History: The patient's family history includes Arthritis in his father and mother; Depression in his father and mother; Diabetes in his father and mother; Hypertension in his father and mother.  ROS:   All other ROS reviewed and  negative. Pertinent positives noted in the HPI.     EKGs/Labs/Other Studies Reviewed:   The following studies were personally reviewed by me today:  EKG:  EKG is ordered today.  The ekg ordered today demonstrates normal sinus rhythm, heart rate 71, no acute ischemic changes, LVH by voltage, repolarization abnormality noted, and was personally reviewed by me.   Recent Labs: 03/15/2020: Hemoglobin 9.1; Platelets 123 03/16/2020: ALT 10; BUN 21; Creatinine, Ser 6.88; Potassium 4.1; Sodium 136   Recent Lipid Panel    Component Value Date/Time   CHOL  11/04/2007 0424    83        ATP III CLASSIFICATION:  <200     mg/dL   Desirable  200-239  mg/dL   Borderline High  >=240    mg/dL   High   TRIG 60 11/04/2007 0424   HDL 20 (L) 11/04/2007 0424   CHOLHDL 4.2 11/04/2007 0424   VLDL 12 11/04/2007 0424   LDLCALC  11/04/2007 0424    51        Total Cholesterol/HDL:CHD Risk Coronary Heart Disease Risk Table                     Men   Women  1/2 Average Risk   3.4   3.3    Physical Exam:   VS:  Pulse 71   Temp (!) 97.3 F (36.3 C)   Ht 6\' 3"  (1.905 m)   Wt 218 lb 9.6 oz (99.2 kg)   SpO2 99%   BMI 27.32 kg/m    Wt Readings from Last 3 Encounters:  10/15/20 218 lb 9.6 oz (99.2 kg)  07/16/20 218 lb 6.4 oz (99.1 kg)  05/07/20 226 lb 6.4 oz (102.7 kg)    General: Well nourished, well developed, in no acute distress Head: Atraumatic, normal size  Eyes: PEERLA, EOMI  Neck: Supple, no JVD Endocrine: No thryomegaly Cardiac: Normal S1, S2; 2 out of 6 systolic ejection murmur, early peaking Lungs: Clear to auscultation  bilaterally, no wheezing, rhonchi or rales  Abd: Soft, nontender, no hepatomegaly  Ext: No edema, pulses 2+, bilateral upper extremity AV fistulas Musculoskeletal: No deformities, BUE and BLE strength normal and equal Skin: Warm and dry, no rashes   Neuro: Alert and oriented to person, place, time, and situation, CNII-XII grossly intact, no focal deficits  Psych: Normal mood  and affect   ASSESSMENT:   Donney Caraveo is a 49 y.o. male who presents for the following: 1. Nonrheumatic aortic valve stenosis   2. Essential hypertension   3. Palpitations     PLAN:   1. Nonrheumatic aortic valve stenosis -Moderate aortic stenosis on echo earlier this year.  No change in symptoms.  Murmur still early peaking.  He will see me back in 6 months with a repeat echo as planned, last visit.  2. Essential hypertension -Blood pressure mainly managed at dialysis.  He has bilateral upper extremity AV fistulas.  Not able to get accurate BPs on him today.  3. Palpitations -Episodes of palpitations.  We will send him with a slip of paper to get his TSH checked.  This has to be drawn with dialysis.  His EKG demonstrates LVH with no acute ischemic changes.  He does have repolarization abnormality.  Symptoms have improved with reduction in caffeine.  I highly suspect some of this is related to increased caffeine consumption.  We will proceed with a 3-day Zio patch to exclude any arrhythmia.  I will notify him of his results by phone.  He can proceed with any surgery he likes.  This would not delay any surgery.  Symptoms have improved with caffeine reduction.  Disposition: Return in about 6 months (around 04/15/2021).  Medication Adjustments/Labs and Tests Ordered: Current medicines are reviewed at length with the patient today.  Concerns regarding medicines are outlined above.  Orders Placed This Encounter  Procedures  . TSH  . LONG TERM MONITOR (3-14 DAYS)  . EKG 12-Lead  . ECHOCARDIOGRAM COMPLETE   No orders of the defined types were placed in this encounter.   Patient Instructions  Medication Instructions:  No Changes In Medications at this time.  *If you need a refill on your cardiac medications before your next appointment, please call your pharmacy*  Lab Work: TSH- please have this drawn  If you have labs (blood work) drawn today and your tests are completely normal, you  will receive your results only by: Marland Kitchen MyChart Message (if you have MyChart) OR . A paper copy in the mail If you have any lab test that is abnormal or we need to change your treatment, we will call you to review the results.  Testing/Procedures: Your physician has requested that you have an echocardiogram in 6 months . Echocardiography is a painless test that uses sound waves to create images of your heart. It provides your doctor with information about the size and shape of your heart and how well your heart's chambers and valves are working. You may receive an ultrasound enhancing agent through an IV if needed to better visualize your heart during the echo.This procedure takes approximately one hour. There are no restrictions for this procedure. This will take place at the [address].   ZIO XT- Long Term Monitor Instructions   Your physician has requested you wear your ZIO patch monitor 3 days.   This is a single patch monitor.  [employer] supplies one patch monitor per enrollment.  Additional stickers are not available.   Please  do not apply patch if you will be having a Nuclear Stress Test, Echocardiogram, Cardiac CT, MRI, or Chest Xray during the time frame you would be wearing the monitor. The patch cannot be worn during these tests.  You cannot remove and re-apply the ZIO XT patch monitor.   Your ZIO patch monitor will be sent USPS Priority mail from [facility] directly to your home address. The monitor may also be mailed to a PO BOX if home delivery is not available.   It may take 3-5 days to receive your monitor after you have been enrolled.   Once you have received you monitor, please review enclosed instructions.  Your monitor has already been registered assigning a specific monitor serial # to you.   Applying the monitor   Shave hair from upper left chest.   Hold abrader disc by orange tab.  Rub abrader in 40 strokes over left upper chest as indicated in  your monitor instructions.   Clean area with 4 enclosed alcohol pads .  Use all pads to assure are is cleaned thoroughly.  Let dry.   Apply patch as indicated in monitor instructions.  Patch will be place under collarbone on left side of chest with arrow pointing upward.   Rub patch adhesive wings for 2 minutes.Remove white label marked "1".  Remove white label marked "2".  Rub patch adhesive wings for 2 additional minutes.   While looking in a mirror, press and release button in center of patch.  A small green light will flash 3-4 times .  This will be your only indicator the monitor has been turned on.     Do not shower for the first 24 hours.  You may shower after the first 24 hours.   Press button if you feel a symptom. You will hear a small click.  Record Date, Time and Symptom in the Patient Log Book.   When you are ready to remove patch, follow instructions on last 2 pages of Patient Log Book.  Stick patch monitor onto last page of Patient Log Book.   Place Patient Log Book in [place] box.  Use locking tab on box and tape box closed securely.  The [employer] has [employer] on it.  Please place in mailbox as soon as possible.  Your physician should have your test results approximately 7 days after the monitor has been mailed back to [facility].   Call [place] at [phone] if you have questions regarding your ZIO XT patch monitor.  Call them immediately if you see an orange light blinking on your monitor.   If your monitor falls off in less than 4 days contact our Monitor department at [phone].  If your monitor becomes loose or falls off after 4 days call [employer] at [phone] for suggestions on securing your monitor.    Follow-Up: At [facility], you and your health needs are our priority.  As part of our continuing mission to provide you with exceptional heart care, we have created designated Provider Care Teams.  These Care Teams  include your primary Cardiologist (physician) and Advanced Practice Providers (APPs -  Physician Assistants and Nurse Practitioners) who all work together to provide you with the care you need, when you need it.  Your next appointment:   6 month(s)  The format for your next appointment:   In Person  Provider:   Eleonore Chiquito, MD    Time Spent with Patient: I have spent  a total of 35 minutes with patient reviewing hospital notes, telemetry, EKGs, labs and examining the patient as well as establishing an assessment and plan that was discussed with the patient.  > 50% of time was spent in direct patient care.  Signed, Addison Naegeli. Audie Box, [place]  [address] [place], [place] [address] [phone]  10/15/2020 10:29 AM

## 2020-12-29 ENCOUNTER — Telehealth: Payer: Self-pay | Admitting: Cardiovascular Disease

## 2020-12-29 NOTE — Telephone Encounter (Signed)
Spoke with patient he reports while in the hospital and even on dialysis days prior to being hospitalized his diastolic blood pressure has been running in the 30s. He reports he has no symptoms. He does not check his blood pressure daily, there is no log to report. He is not home currently to check his blood pressure either. [facility] discontinued his blood pressure medication upon discharge.   Advised patient to check blood pressure twice a day for a week and report back with readings. Patient reports his nephrologist said he needed to have his blood pressure addressed right away. He states he has a nephrology appointment tomorrow and will have him do something about it. Offered office appointment and appointment made with Dr. Audie Box for 12/31/20.

## 2020-12-29 NOTE — Telephone Encounter (Signed)
Pt c/o BP issue: STAT if pt c/o blurred vision, one-sided weakness or slurred speech  1. What are your last 5 BP readings?  Patient's states he does not have any readings, but his diastolic number has been in the 30's  2. Are you having any other symptoms (ex. Dizziness, headache, blurred vision, passed out)? No   3. What is your BP issue?   Patient states he was recently discharged from [facility]. Per patient, he was there for 3 weeks and they took him off his BP medication because his diastolic has been in the 37'C. He is requesting a call back to discuss further.

## 2020-12-29 NOTE — Telephone Encounter (Signed)
Agree with appointment. I agree with nephrology evaluation first as he is a dialysis patient.   [name], MD, [place]  [address] [place], [place] [address] [phone]  1:21 PM

## 2020-12-30 NOTE — Progress Notes (Deleted)
Cardiology Office Note:   Date:  12/30/2020  NAME:  [name]    MRN: [id number] DOB:  17-Jan-1971   PCP:  Joesph July, MD  Cardiologist:  No primary care provider on file.  Electrophysiologist:  None   Referring MD: Joesph July, MD   No chief complaint on file. ***  History of Present Illness:   Nesbit Michon is a 50 y.o. male with a hx of ESRD, HTN, aortic stenosis who presents for follow-up. Having issues with BP.    Problem list 1. ESRD -s/p failed kidney transplant -Left heart catheterization 2014 with normal coronary arteries 2. HTN 3. Perianal condyloma 4. Moderate aortic stenosis -V max 3.1 m/s, MG 21 mmHG, AVA 1.5 cm2 05/2020  Past Medical History: Past Medical History:  Diagnosis Date  . ADHD (attention deficit hyperactivity disorder)   . Anemia   . Anemia, chronic disease 07/13/2011  . Anxiety   . Arthritis   . Bipolar 2 disorder ([place]) 08/18/2011   Diagnosed in 2011.   . Bipolar disorder ([place])   . Blood transfusion   . Blood transfusion without reported diagnosis   . Chronic diastolic CHF (congestive heart failure) ([place])   . Complication of anesthesia    Woke up intubated gets combative  afraid to be alone   . Congestive heart failure (CHF) ([place]) 07/13/2011   Onset 2006.  Followed by Dr. Johnsie Cancel.  S/p cardiac catheterization in [facility] Dr. Marlou Porch..  S/p cardiac catheterization in 2014 by Nishan.  Echo 2014.   . Depression   . Erectile dysfunction   . ESRD on hemodialysis ([place]) 07/13/2011   Glomerulonephritis at age 38, started HD in 58.  Deceased donor renal transplant 1999 at [facility], then kidney failed and went back on HD in 2006.  L forearm AVF . s/p repeat renal transplant 10/2014 [facility].  . GI bleed 11/01/2011   Rectal bleeding with emesis/diarrhea   . Hemodialysis status ([place])   . Hydrocele, right 11/25/2014  . Hypertension   . Hypertensive emergency 01/06/2013  . Hypoglycemia 01/11/2013  . Influenza-like illness 12/18/2013  . Mild ascending aorta  dilatation (HCC)   . NICM (nonischemic cardiomyopathy) ([place])   . Pericardial effusion    a. Mod by echo in 2014, similar to prior.  . Peripheral polyneuropathy 08/04/2016  . Pulmonary edema 01/06/2013  . Pulmonary hypertension associated with end stage renal disease on dialysis ([place]) 07/13/2011  . Renal transplant, status post 11/25/2014   Pt with Glomerulonephritis at age 75. Deceased donor renal transplant 1999 right and left renal transplant 10/2014.  Followed by [facility] transplant team; nephrologist is Dr. Barbette Merino.   . Respiratory failure with hypoxia ([place]) 01/06/2013  . Shortness of breath 12/12/2012  . Swollen testicle 07/13/2011  . URI (upper respiratory infection) 01/25/2012    Past Surgical History: Past Surgical History:  Procedure Laterality Date  . ANGIOPLASTY    . CHOLECYSTECTOMY    . DIALYSIS FISTULA CREATION    . EVALUATION UNDER ANESTHESIA WITH HEMORRHOIDECTOMY N/A 03/15/2020   Procedure: CONTROL OF ANAL BLEEDING;  Surgeon: Ileana Roup, MD;  Location: [place];  Service: General;  Laterality: N/A;  . KIDNEY TRANSPLANT  10/17/14  . KNEE SURGERY    . LEFT HEART CATHETERIZATION WITH CORONARY ANGIOGRAM N/A 10/01/2013   Procedure: LEFT HEART CATHETERIZATION WITH CORONARY ANGIOGRAM;  Surgeon: [name], MD;  Location: [facility] CATH LAB;  Service: Cardiovascular;  Laterality: N/A;  . MOUTH SURGERY    . NEPHRECTOMY TRANSPLANTED ORGAN  Current Medications: No outpatient medications have been marked as taking for the 12/31/20 encounter (Appointment) with O'Neal, Cassie Freer, MD.     Allergies:    Delacort [hydrocortisone base]; Benicar [olmesartan]; Food; Hydralazine; and Nitrates, organic   Social History: Social History   Socioeconomic History  . Marital status: Single    Spouse name: Not on file  . Number of children: 0  . Years of education: 32+  . Highest education level: Not on file  Occupational History  . Occupation: Chef- fulltime  Tobacco Use  .  Smoking status: Former Smoker    Years: 2.00    Types: Cigars  . Smokeless tobacco: Never Used  Vaping Use  . Vaping Use: Never used  Substance and Sexual Activity  . Alcohol use: No    Alcohol/week: 1.0 standard drink    Types: 1 Standard drinks or equivalent per week    Comment: Stopped drinking after Kidney transplant in December 2015  . Drug use: No  . Sexual activity: Yes  Other Topics Concern  . 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      Tobacco: none      Alcohol: none      Drugs: none      Exercise: cycling; 3 days per week.      Sexual activity: females only; no male encounters; Chlamydia age 48.  Total partner = 55.  Condoms.      Caffeine use: Soda sometimes   Social Determinants of Radio broadcast assistant Strain: Not on file  Food Insecurity: Not on file  Transportation Needs: Not on file  Physical Activity: Not on file  Stress: Not on file  Social Connections: Not on file     Family History: The patient's ***family history includes Arthritis in his father and mother; Depression in his father and mother; Diabetes in his father and mother; Hypertension in his father and mother.  ROS:   All other ROS reviewed and negative. Pertinent positives noted in the HPI.     EKGs/Labs/Other Studies Reviewed:   The following studies were personally reviewed by me today:  EKG:  EKG is *** ordered today.  The ekg ordered today demonstrates ***, and was personally reviewed by me.   Recent Labs: 03/15/2020: Hemoglobin 9.1; Platelets 123 03/16/2020: ALT 10; BUN 21; Creatinine, Ser 6.88; Potassium 4.1; Sodium 136   Recent Lipid Panel    Component Value Date/Time   CHOL  11/04/2007 0424    83        ATP III CLASSIFICATION:  <200     mg/dL   Desirable  200-239  mg/dL   Borderline High  >=240     mg/dL   High   TRIG 60 11/04/2007 0424   HDL 20 (L) 11/04/2007 0424   CHOLHDL 4.2 11/04/2007 0424   VLDL 12 11/04/2007 0424   LDLCALC  11/04/2007 0424    51        Total Cholesterol/HDL:CHD Risk Coronary Heart Disease Risk Table                     Men   Women  1/2 Average Risk   3.4   3.3    Physical Exam:   VS:  There were no vitals  taken for this visit.   Wt Readings from Last 3 Encounters:  10/15/20 218 lb 9.6 oz (99.2 kg)  07/16/20 218 lb 6.4 oz (99.1 kg)  05/07/20 226 lb 6.4 oz (102.7 kg)    General: Well nourished, well developed, in no acute distress Head: Atraumatic, normal size  Eyes: PEERLA, EOMI  Neck: Supple, no JVD Endocrine: No thryomegaly Cardiac: Normal S1, S2; RRR; no murmurs, rubs, or gallops Lungs: Clear to auscultation bilaterally, no wheezing, rhonchi or rales  Abd: Soft, nontender, no hepatomegaly  Ext: No edema, pulses 2+ Musculoskeletal: No deformities, BUE and BLE strength normal and equal Skin: Warm and dry, no rashes   Neuro: Alert and oriented to person, place, time, and situation, CNII-XII grossly intact, no focal deficits  Psych: Normal mood and affect   ASSESSMENT:   Jehan Bonano is a 50 y.o. male who presents for the following: No diagnosis found.  PLAN:   There are no diagnoses linked to this encounter.  Disposition: No follow-ups on file.  Medication Adjustments/Labs and Tests Ordered: Current medicines are reviewed at length with the patient today.  Concerns regarding medicines are outlined above.  No orders of the defined types were placed in this encounter.  No orders of the defined types were placed in this encounter.   There are no Patient Instructions on file for this visit.   Time Spent with Patient: I have spent a total of *** minutes with patient reviewing hospital notes, telemetry, EKGs, labs and examining the patient as well as establishing an assessment and plan that was discussed with the patient.  > 50% of time was  spent in direct patient care.  Signed, Addison Naegeli. Audie Box, [place]  [address] [place], [place] [address] [phone]  12/30/2020 6:29 AM

## 2020-12-31 ENCOUNTER — Ambulatory Visit: Payer: Medicare Other | Admitting: Cardiovascular Disease

## 2021-02-18 NOTE — Telephone Encounter (Signed)
Monitor was never returned and marked "lost" in the [employer] system. Order will be cancelled 

## 2021-04-15 ENCOUNTER — Other Ambulatory Visit (HOSPITAL_COMMUNITY): Payer: Medicare Other

## 2021-04-15 NOTE — Progress Notes (Signed)
Cardiology Office Note:   Date:  04/16/2021  NAME:  [name]    MRN: [id number] DOB:  1971/04/10   PCP:  Joesph July, MD  Cardiologist:  None  Electrophysiologist:  None   Referring MD: Joesph July, MD   Chief Complaint  Patient presents with   Follow-up    History of Present Illness:   [name] is a 50 y.o. male with a hx of hypertension, moderate aortic stenosis, ESRD on hemodialysis who presents for follow-up.  He is still waiting for his right AV fistula to mature.  Seems to be doing well.  BP difficult to assess given his bilateral AV fistulas in the upper extremities.  I do have plan to ligate the left aVF.  He missed his echocardiogram yesterday.  There was confusion about that appointment and the appointment today.  He will get this rescheduled.  Still has a prominent murmur of aortic stenosis.  I can hear S2.  Suspect this remains moderate.  No recent lipid profile.  Likely a good idea to get this checked.  I suspect it would be beneficial for him to be on a statin agent.  He is not currently on 1.  Overall doing well.  He is on dialysis Monday Wednesday Friday.  No issues with this.  Problem list 1. ESRD -s/p failed kidney transplant  -Left heart catheterization 2014 with normal coronary arteries 2. HTN 3. Perianal condyloma  4. Moderate aortic stenosis -V max 3.1 m/s, MG 21 mmHG, AVA 1.5 cm2 05/2020  Past Medical History: Past Medical History:  Diagnosis Date   ADHD (attention deficit hyperactivity disorder)    Anemia    Anemia, chronic disease 07/13/2011   Anxiety    Arthritis    Bipolar 2 disorder ([place]) 08/18/2011   Diagnosed in 2011.    Bipolar disorder ([place])    Blood transfusion    Blood transfusion without reported diagnosis    Chronic diastolic CHF (congestive heart failure) (HCC)    Complication of anesthesia    Woke up intubated gets combative  afraid to be alone    Congestive heart failure (CHF) ([place]) 07/13/2011   Onset 2006.  Followed by Dr.  Johnsie Cancel.  S/p cardiac catheterization in [facility] Dr. Marlou Porch..  S/p cardiac catheterization in 2014 by Nishan.  Echo 2014.    Depression    Erectile dysfunction    ESRD on hemodialysis ([place]) 07/13/2011   Glomerulonephritis at age 84, started HD in 24.  Deceased donor renal transplant 1999 at [facility], then kidney failed and went back on HD in 2006.  L forearm AVF . s/p repeat renal transplant 10/2014 [facility].   GI bleed 11/01/2011   Rectal bleeding with emesis/diarrhea    Hemodialysis status ([place])    Hydrocele, right 11/25/2014   Hypertension    Hypertensive emergency 01/06/2013   Hypoglycemia 01/11/2013   Influenza-like illness 12/18/2013   Mild ascending aorta dilatation (HCC)    NICM (nonischemic cardiomyopathy) (HCC)    Pericardial effusion    a. Mod by echo in 2014, similar to prior.   Peripheral polyneuropathy 08/04/2016   Pulmonary edema 01/06/2013   Pulmonary hypertension associated with end stage renal disease on dialysis [facility]) 07/13/2011   Renal transplant, status post 11/25/2014   Pt with Glomerulonephritis at age 70. Deceased donor renal transplant 1999 right and left renal transplant 10/2014.  Followed by [facility] transplant team; nephrologist is Dr. Barbette Merino.    Respiratory failure with hypoxia ([place]) 01/06/2013   Shortness of breath  12/12/2012   Swollen testicle 07/13/2011   URI (upper respiratory infection) 01/25/2012    Past Surgical History: Past Surgical History:  Procedure Laterality Date   ANGIOPLASTY     CHOLECYSTECTOMY     DIALYSIS FISTULA CREATION     EVALUATION UNDER ANESTHESIA WITH HEMORRHOIDECTOMY N/A 03/15/2020   Procedure: CONTROL OF ANAL BLEEDING;  Surgeon: Ileana Roup, MD;  Location: [place];  Service: General;  Laterality: N/A;   KIDNEY TRANSPLANT  10/17/14   KNEE SURGERY     LEFT HEART CATHETERIZATION WITH CORONARY ANGIOGRAM N/A 10/01/2013   Procedure: LEFT HEART CATHETERIZATION WITH CORONARY ANGIOGRAM;  Surgeon: [name], MD;  Location: [facility] CATH LAB;   Service: Cardiovascular;  Laterality: N/A;   MOUTH SURGERY     NEPHRECTOMY TRANSPLANTED ORGAN      Current Medications: Current Meds  Medication Sig   acyclovir (ZOVIRAX) 200 MG capsule Take by mouth.   amphetamine-dextroamphetamine (ADDERALL XR) 20 MG 24 hr capsule Take 20 mg by mouth daily.   amphetamine-dextroamphetamine (ADDERALL) 10 MG tablet Take 10 mg by mouth daily.    ARIPiprazole (ABILIFY) 20 MG tablet Take 20 mg by mouth daily.   aspirin EC 81 MG tablet Take 81 mg by mouth daily.   calcium carbonate (OS-CAL) 1250 (500 Ca) MG chewable tablet Chew 1 tablet by mouth as needed.   carvedilol (COREG) 25 MG tablet Take 25 mg by mouth 2 (two) times daily with a meal. Patient I taking 50mg  two times daily.   clonazePAM (KLONOPIN) 1 MG tablet Take 0.3 mg by mouth daily.    HYDROcodone-acetaminophen (NORCO) 7.5-325 MG tablet Take by mouth.   lidocaine-prilocaine (EMLA) cream Apply topically.   silver sulfADIAZINE (SILVADENE) 1 % cream Apply 1 application topically in the morning and at bedtime. To open sores.   [DISCONTINUED] acyclovir (ZOVIRAX) 200 MG capsule Take 200 mg by mouth See admin instructions. 200mg  by mouth twice daily. Take and additional 200mg  capsule by mouth after dialysis on dialysis days.   [DISCONTINUED] amphetamine-dextroamphetamine (ADDERALL XR) 20 MG 24 hr capsule Take 20 mg by mouth daily.   [DISCONTINUED] cefdinir (OMNICEF) 300 MG capsule cefdinir 300 mg capsule  TAKE 1 CAPSULE BY MOUTH EVERY OTHER DAY.   [DISCONTINUED] HYDROcodone-acetaminophen (NORCO/VICODIN) 5-325 MG per tablet Take 1 tablet by mouth every 6 (six) hours as needed for moderate pain.   [DISCONTINUED] imiquimod (ALDARA) 5 % cream SMARTSIG:1 Topical Every Night     Allergies:    Delacort [hydrocortisone base]; Benicar [olmesartan]; Food; Hydralazine; and Nitrates, organic   Social History: Social History   Socioeconomic History   Marital status: Single    Spouse name: Not on file   Number of  children: 0   Years of education: 20+   Highest education level: Not on file  Occupational History   Occupation: Chef- fulltime  Tobacco Use   Smoking status: Former    Pack years: 0.00    Types: Cigars   Smokeless tobacco: Never  Vaping Use   Vaping Use: Never used  Substance and Sexual Activity   Alcohol use: No    Alcohol/week: 1.0 standard drink    Types: 1 Standard drinks or equivalent per week    Comment: Stopped drinking after Kidney transplant in December 2015   Drug use: No   Sexual activity: Yes  Other Topics Concern   Not on file  Social History Narrative   Marital status: single; dating      Children; none      Lives:  alone      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      Tobacco: none      Alcohol: none      Drugs: none      Exercise: cycling; 3 days per week.      Sexual activity: females only; no male encounters; Chlamydia age 38.  Total partner = 55.  Condoms.      Caffeine use: Soda sometimes   Social Determinants of Radio broadcast assistant Strain: Not on file  Food Insecurity: Not on file  Transportation Needs: Not on file  Physical Activity: Not on file  Stress: Not on file  Social Connections: Not on file     Family History: The patient's family history includes Arthritis in his father and mother; Depression in his father and mother; Diabetes in his father and mother; Hypertension in his father and mother.  ROS:   All other ROS reviewed and negative. Pertinent positives noted in the HPI.     EKGs/Labs/Other Studies Reviewed:   The following studies were personally reviewed by me today:  Recent Labs: No results found for requested labs within last 8760 hours.   Recent Lipid Panel    Component Value Date/Time   CHOL  11/04/2007 0424    83        ATP III CLASSIFICATION:  <200     mg/dL   Desirable  200-239  mg/dL   Borderline High  >=240    mg/dL   High   TRIG 60 11/04/2007  0424   HDL 20 (L) 11/04/2007 0424   CHOLHDL 4.2 11/04/2007 0424   VLDL 12 11/04/2007 0424   LDLCALC  11/04/2007 0424    51        Total Cholesterol/HDL:CHD Risk Coronary Heart Disease Risk Table                     Men   Women  1/2 Average Risk   3.4   3.3    Physical Exam:   VS:  Pulse 77   Ht 6\' 3"  (1.905 m)   Wt 206 lb (93.4 kg)   SpO2 99%   BMI 25.75 kg/m    Wt Readings from Last 3 Encounters:  04/16/21 206 lb (93.4 kg)  10/15/20 218 lb 9.6 oz (99.2 kg)  07/16/20 218 lb 6.4 oz (99.1 kg)    General: Well nourished, well developed, in no acute distress Head: Atraumatic, normal size  Eyes: PEERLA, EOMI  Neck: Supple, no JVD Endocrine: No thryomegaly Cardiac: Normal S1, S2; RRR; 3 out of 6 systolic ejection murmur, early peaking, S2 present Lungs: Clear to auscultation bilaterally, no wheezing, rhonchi or rales  Abd: Soft, nontender, no hepatomegaly  Ext: No edema, pulses 2+ Musculoskeletal: No deformities, BUE and BLE strength normal and equal Skin: Warm and dry, no rashes   Neuro: Alert and oriented to person, place, time, and situation, CNII-XII grossly intact, no focal deficits  Psych: Normal mood and affect   ASSESSMENT:   Traven Davids is a 50 y.o. male who presents for the following: 1. Nonrheumatic aortic valve stenosis   2. Essential hypertension   3. Hyperlipidemia, unspecified hyperlipidemia type     PLAN:   1. Nonrheumatic aortic valve stenosis -Moderate aortic stenosis in 2021.  Murmur today remains moderate.  Needs a follow-up echocardiogram.  He missed yesterday.  We will get this repeated and I will follow-up with him  by phone.  2. Essential hypertension -BP managed by nephrology.  3. Hyperlipidemia, unspecified hyperlipidemia type -Would recommend a lipid profile.  Has not had one in years.   Disposition: Return in about 1 year (around 04/16/2022).  Medication Adjustments/Labs and Tests Ordered: Current medicines are reviewed at length with the  patient today.  Concerns regarding medicines are outlined above.  Orders Placed This Encounter  Procedures   Lipid panel   ECHOCARDIOGRAM COMPLETE   No orders of the defined types were placed in this encounter.   Patient Instructions  Medication Instructions:  The current medical regimen is effective;  continue present plan and medications.  *If you need a refill on your cardiac medications before your next appointment, please call your pharmacy*   Lab Work: LIPID *you may have these drawn at any [facility]- fasting, nothing to eat or drink*  If you have labs (blood work) drawn today and your tests are completely normal, you will receive your results only by: [place] (if you have MyChart) OR A paper copy in the mail If you have any lab test that is abnormal or we need to change your treatment, we will call you to review the results.   Testing/Procedures: Echocardiogram - Your physician has requested that you have an echocardiogram. Echocardiography is a painless test that uses sound waves to create images of your heart. It provides your doctor with information about the size and shape of your heart and how well your heart's chambers and valves are working. This procedure takes approximately one hour. There are no restrictions for this procedure. This will be performed at our [facility] location - [address].    Follow-Up: At [facility], you and your health needs are our priority.  As part of our continuing mission to provide you with exceptional heart care, we have created designated Provider Care Teams.  These Care Teams include your primary Cardiologist (physician) and Advanced Practice Providers (APPs -  Physician Assistants and Nurse Practitioners) who all work together to provide you with the care you need, when you need it.  We recommend signing up for the patient portal called "MyChart".  Sign up information is provided on this After Visit Summary.   MyChart is used to connect with patients for Virtual Visits (Telemedicine).  Patients are able to view lab/test results, encounter notes, upcoming appointments, etc.  Non-urgent messages can be sent to your provider as well.   To learn more about what you can do with MyChart, go to [email or web address].    Your next appointment:   12 month(s)  The format for your next appointment:   In Person  Provider:   Eleonore Chiquito, MD      Time Spent with Patient: I have spent a total of 25 minutes with patient reviewing hospital notes, telemetry, EKGs, labs and examining the patient as well as establishing an assessment and plan that was discussed with the patient.  > 50% of time was spent in direct patient care.  Signed, Addison Naegeli. Audie Box, MD, [place]  [address] [place] [employer] [place], [place] [address] [phone]  04/16/2021 11:09 AM

## 2021-04-16 ENCOUNTER — Other Ambulatory Visit: Payer: Self-pay

## 2021-04-16 ENCOUNTER — Ambulatory Visit (INDEPENDENT_AMBULATORY_CARE_PROVIDER_SITE_OTHER): Payer: Medicare Other | Admitting: Cardiovascular Disease

## 2021-04-16 ENCOUNTER — Encounter: Payer: Self-pay | Admitting: Cardiovascular Disease

## 2021-04-16 VITALS — HR 77 | Ht 75.0 in | Wt 206.0 lb

## 2021-04-16 DIAGNOSIS — I10 Essential (primary) hypertension: Secondary | ICD-10-CM | POA: Diagnosis not present

## 2021-04-16 DIAGNOSIS — E785 Hyperlipidemia, unspecified: Secondary | ICD-10-CM

## 2021-04-16 DIAGNOSIS — I350 Nonrheumatic aortic (valve) stenosis: Secondary | ICD-10-CM | POA: Diagnosis not present

## 2021-04-16 NOTE — Patient Instructions (Signed)
Medication Instructions:  The current medical regimen is effective;  continue present plan and medications.  *If you need a refill on your cardiac medications before your next appointment, please call your pharmacy*   Lab Work: LIPID *you may have these drawn at any [facility]- fasting, nothing to eat or drink*  If you have labs (blood work) drawn today and your tests are completely normal, you will receive your results only by: [place] (if you have MyChart) OR A paper copy in the mail If you have any lab test that is abnormal or we need to change your treatment, we will call you to review the results.   Testing/Procedures: Echocardiogram - Your physician has requested that you have an echocardiogram. Echocardiography is a painless test that uses sound waves to create images of your heart. It provides your doctor with information about the size and shape of your heart and how well your heart's chambers and valves are working. This procedure takes approximately one hour. There are no restrictions for this procedure. This will be performed at our [facility] location - [address].    Follow-Up: At [facility], you and your health needs are our priority.  As part of our continuing mission to provide you with exceptional heart care, we have created designated Provider Care Teams.  These Care Teams include your primary Cardiologist (physician) and Advanced Practice Providers (APPs -  Physician Assistants and Nurse Practitioners) who all work together to provide you with the care you need, when you need it.  We recommend signing up for the patient portal called "MyChart".  Sign up information is provided on this After Visit Summary.  MyChart is used to connect with patients for Virtual Visits (Telemedicine).  Patients are able to view lab/test results, encounter notes, upcoming appointments, etc.  Non-urgent messages can be sent to your provider as well.   To learn more about  what you can do with MyChart, go to [email or web address].    Your next appointment:   12 month(s)  The format for your next appointment:   In Person  Provider:   Eleonore Chiquito, MD

## 2021-05-11 ENCOUNTER — Ambulatory Visit (HOSPITAL_COMMUNITY): Payer: Medicare Other | Attending: Cardiology

## 2021-05-11 ENCOUNTER — Other Ambulatory Visit: Payer: Self-pay

## 2021-05-11 DIAGNOSIS — I350 Nonrheumatic aortic (valve) stenosis: Secondary | ICD-10-CM | POA: Insufficient documentation

## 2021-05-11 LAB — ECHOCARDIOGRAM COMPLETE
AR max vel: 1.9 cm2
AV Area VTI: 2 cm2
AV Area mean vel: 1.86 cm2
AV Mean grad: 20 mmHg
AV Peak grad: 38.4 mmHg
Ao pk vel: 3.1 m/s
Area-P 1/2: 2.81 cm2
MV VTI: 3.02 cm2
P 1/2 time: 415 msec
S' Lateral: 4.45 cm

## 2022-03-24 ENCOUNTER — Telehealth: Payer: Self-pay | Admitting: Cardiovascular Disease

## 2022-03-24 NOTE — Telephone Encounter (Signed)
  Patient was in dialysis yesterday and had some slight numbness in his left arm and Tachycardia. The Dialysis RN checked him out and said she didn't feel the need for him to go to the ED. He did go to the [facility] on [address] in [place] this morning and get an EKG just to be safe and it was normal. The Urgent Care advised him to touch base with his Cardiologist today.  He does not have any symptoms at this time.

## 2022-03-24 NOTE — Telephone Encounter (Signed)
Called patient, he states he was just told to call and give Dr.O'Neal and update on what occurred yesterday. He did go to urgent care this morning (notes in care everywhere). He states he feels fine today, no symptoms.  Will route to MD to review.  Thanks!

## 2022-03-25 NOTE — Telephone Encounter (Signed)
Called patient, advised of message below. Patient verbalized understanding.  

## 2022-04-05 ENCOUNTER — Telehealth: Payer: Self-pay | Admitting: Cardiovascular Disease

## 2022-04-05 NOTE — Telephone Encounter (Signed)
Spoke with patient of Dr. Audie Box  He reports NEPHROLOGIST would like him to take coreg '25mg'$  BID instead of '50mg'$  BID He does not monitor BP at home - checked at dialysis -- he has access in both arms currently  He denies issues with HR and denies palps - as long as he stays away from caffeine   Advised will send to Dr. Audie Box to review/advise

## 2022-04-05 NOTE — Telephone Encounter (Signed)
Pt c/o medication issue:  1. Name of Medication: carvedilol (COREG) 25 MG tablet  2. How are you currently taking this medication (dosage and times per day)? Take 25 mg by mouth 2 (two) times daily with a meal. Patient I taking '50mg'$  two times daily.  3. Are you having a reaction (difficulty breathing--STAT)? no  4. What is your medication issue? Patient calling because his neurologist want him to take '25mg'$  in the morning and at night. Making it '50mg'$  for the day. Not '50mg'$  in the morning and at not. Please advise

## 2022-04-06 MED ORDER — CARVEDILOL 25 MG PO TABS: 25.0000 mg | ORAL_TABLET | Freq: Two times a day (BID) | ORAL | 0 refills | Status: DC

## 2022-04-06 NOTE — Telephone Encounter (Signed)
Spoke with patient. Notified him that Dr. Audie Box is OK with him taking coreg '25mg'$  BID. Refilled as such. No further assistance needed

## 2022-04-06 NOTE — Telephone Encounter (Signed)
Mark Rile, MD  Fidel Levy, RN Cc: Caprice Beaver, LPN Caller: Unspecified (Yesterday,  3:30 PM) That is fine. -W

## 2022-04-18 ENCOUNTER — Telehealth: Payer: Self-pay | Admitting: Cardiovascular Disease

## 2022-04-18 NOTE — Telephone Encounter (Signed)
Pt is requesting a call back in regards to an antibiotic that his dentist is wanting him to start taking before his procedure. He states the dentist would be more comfortable with his cardiologist prescribing the antibiotics.   He also would like to discuss his medication for hypertension that he receives from another doctor. He was told they would also prefer his cardiologist to handle these medications.

## 2022-04-18 NOTE — Telephone Encounter (Signed)
The patient is having one tooth pulled by an oral surgeon because the root is so long, his normal dentist can not do it.  He said normally his nephrologist is the doctor that would send antibiotic's in but he is out and his team is asking him to contact Dr. Audie Box. This is to occur on June 19 at 3 pm.   The patient said he is prescribed Amlodipine 5 mg daily; "been on this for years." (Not on medlist) States he only takes this Tuesday, Thursday, Saturday and Sunday, which are his non HD days. Also notes it it not ordered to skip his HD days but if he takes it his BP will be to low and he can't get dialyzed. The patient was told they (nephrologist) would prefer his cardiologist handle this medication.   Will forward to MD for advisement.

## 2022-04-21 ENCOUNTER — Other Ambulatory Visit: Payer: Self-pay

## 2022-04-21 MED ORDER — AMLODIPINE BESYLATE 5 MG PO TABS: 5.0000 mg | ORAL_TABLET | Freq: Every day | ORAL | 3 refills | Status: DC

## 2022-04-21 NOTE — Telephone Encounter (Signed)
Called patient, he states he did not believe the cardiologist should have to do the antibiotic, he will notify his PCP of this need.   Patient also aware of the amlodipine 5 mg- we added this to his medication list.   Patient thankful for call back.

## 2022-06-08 ENCOUNTER — Other Ambulatory Visit: Payer: Self-pay | Admitting: Cardiovascular Disease

## 2022-06-08 NOTE — Progress Notes (Unsigned)
Cardiology Office Note:   Date:  06/09/2022  NAME:  [name]    MRN: [id number] DOB:  October 26, 1971   PCP:  Joesph July, MD  Cardiologist:  None  Electrophysiologist:  None   Referring MD: Joesph July, MD   Chief Complaint  Patient presents with   Follow-up   History of Present Illness:   [name] is a 51 y.o. male with a hx of ESRD, moderate AS, HTN who presents for follow-up.  He reports he is struggling with anemia.  He will see a hematologist next month.  Hemoglobin was down to 8.3.  He reports low energy.  Blood pressure also was quite low today.  We discussed stopping his amlodipine.  His blood pressure is quite erratic.  It is difficult to measure his blood pressure given to upper extremity AV fistula this.  He did have a left AV fistula removed.  He will take time before blood pressure can be checked in that arm.  He reports shortness of breath with activity.  No chest pain.  Cholesterol level is acceptable on no medication.  He also is on aspirin.  We discussed stopping this.  Murmur still consistent with moderate AAS.  He needs a repeat echo.  I would like for him to start keeping track of his blood pressure.  It is a bit difficult to gauge.  It is hard to manage without knowing the exact values.  He will work on this.  Problem list 1. 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 HTN 3. Perianal condyloma  4. Moderate aortic stenosis -V max 3.1 m/s, MG 21 mmHG, AVA 1.5 cm2 05/2020 5. Chronic anemia  6. HLD -T chol 67, HDL 28, LDL 24, TG 83  Past Medical History: Past Medical History:  Diagnosis Date   ADHD (attention deficit hyperactivity disorder)    Anemia    Anemia, chronic disease 07/13/2011   Anxiety    Arthritis    Bipolar 2 disorder ([place]) 08/18/2011   Diagnosed in 2011.    Bipolar disorder ([place])    Blood transfusion    Blood transfusion without reported diagnosis    Chronic diastolic CHF  (congestive heart failure) (HCC)    Complication of anesthesia    Woke up intubated gets combative  afraid to be alone    Congestive heart failure (CHF) ([place]) 07/13/2011   Onset 2006.  Followed by Dr. Johnsie Cancel.  S/p cardiac catheterization in [facility] Dr. Marlou Porch..  S/p cardiac catheterization in 2014 by [facility].  Echo 2014.    Depression    Erectile dysfunction    ESRD on hemodialysis ([place]) 07/13/2011   Glomerulonephritis at age 17, started HD in 17.  Deceased donor renal transplant 1999 at [facility], then kidney failed and went back on HD in 2006.  L forearm AVF . s/p repeat renal transplant 10/2014 [facility].   GI bleed 11/01/2011   Rectal bleeding with emesis/diarrhea    Hemodialysis status ([place])    Hydrocele, right 11/25/2014   Hypertension    Hypertensive emergency 01/06/2013   Hypoglycemia 01/11/2013   Influenza-like illness 12/18/2013   Mild ascending aorta dilatation (HCC)    NICM (nonischemic cardiomyopathy) (HCC)    Pericardial effusion    a. Mod by echo in 2014, similar to prior.   Peripheral polyneuropathy 08/04/2016   Pulmonary edema 01/06/2013   Pulmonary hypertension associated with end stage renal disease on dialysis [facility]) 07/13/2011   Renal transplant, status post  11/25/2014   Pt with Glomerulonephritis at age 80. Deceased donor renal transplant 1999 right and left renal transplant 10/2014.  Followed by [facility] transplant team; nephrologist is Dr. Barbette Merino.    Respiratory failure with hypoxia ([place]) 01/06/2013   Shortness of breath 12/12/2012   Swollen testicle 07/13/2011   URI (upper respiratory infection) 01/25/2012    Past Surgical History: Past Surgical History:  Procedure Laterality Date   ANGIOPLASTY     CHOLECYSTECTOMY     DIALYSIS FISTULA CREATION     EVALUATION UNDER ANESTHESIA WITH HEMORRHOIDECTOMY N/A 03/15/2020   Procedure: CONTROL OF ANAL BLEEDING;  Surgeon: Ileana Roup, MD;  Location: [facility];  Service: General;  Laterality: N/A;   KIDNEY TRANSPLANT  10/17/14    KNEE SURGERY     LEFT HEART CATHETERIZATION WITH CORONARY ANGIOGRAM N/A 10/01/2013   Procedure: LEFT HEART CATHETERIZATION WITH CORONARY ANGIOGRAM;  Surgeon: [name], MD;  Location: [facility] CATH LAB;  Service: Cardiovascular;  Laterality: N/A;   MOUTH SURGERY     NEPHRECTOMY TRANSPLANTED ORGAN      Current Medications: Current Meds  Medication Sig   amphetamine-dextroamphetamine (ADDERALL XR) 20 MG 24 hr capsule Take 20 mg by mouth daily.   amphetamine-dextroamphetamine (ADDERALL) 10 MG tablet Take 10 mg by mouth daily.    ARIPiprazole (ABILIFY) 20 MG tablet Take 20 mg by mouth daily.   calcium carbonate (OS-CAL) 1250 (500 Ca) MG chewable tablet Chew 1 tablet by mouth as needed.   carvedilol (COREG) 25 MG tablet Take 1 tablet (25 mg total) by mouth 2 times daily with meals.   clonazePAM (KLONOPIN) 1 MG tablet Take 0.3 mg by mouth daily.    HYDROcodone-acetaminophen (NORCO) 7.5-325 MG tablet Take by mouth.   lidocaine-prilocaine (EMLA) cream Apply topically.   silver sulfADIAZINE (SILVADENE) 1 % cream Apply 1 application topically in the morning and at bedtime. To open sores.   [DISCONTINUED] amLODipine (NORVASC) 5 MG tablet Take 1 tablet (5 mg total) by mouth daily.   [DISCONTINUED] aspirin EC 81 MG tablet Take 81 mg by mouth daily.     Allergies:    Delacort [hydrocortisone base]; Benicar [olmesartan]; Food; Hydralazine; and Nitrates, organic   Social History: Social History   Socioeconomic History   Marital status: Single    Spouse name: Not on file   Number of children: 0   Years of education: 20+   Highest education level: Not on file  Occupational History   Occupation: Chef- fulltime  Tobacco Use   Smoking status: Former    Types: Cigars   Smokeless tobacco: Never  Scientific laboratory technician Use: Never used  Substance and Sexual Activity   Alcohol use: No    Alcohol/week: 1.0 standard drink of alcohol    Types: 1 Standard drinks or equivalent per week    Comment:  Stopped drinking after Kidney transplant in December 2015   Drug use: No   Sexual activity: Yes  Other Topics Concern   Not on file  Social History Narrative   Marital status: single; dating      Children; none      Lives: alone      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      Tobacco: none      Alcohol: none      Drugs: none      Exercise: cycling; 3 days per week.      Sexual activity: females only; no male  encounters; Chlamydia age 65.  Total partner = 55.  Condoms.      Caffeine use: Soda sometimes   Social Determinants of Radio broadcast assistant Strain: Not on file  Food Insecurity: Not on file  Transportation Needs: Not on file  Physical Activity: Not on file  Stress: Not on file  Social Connections: Not on file     Family History: The patient's family history includes Arthritis in his father and mother; Depression in his father and mother; Diabetes in his father and mother; Hypertension in his father and mother.  ROS:   All other ROS reviewed and negative. Pertinent positives noted in the HPI.     EKGs/Labs/Other Studies Reviewed:   The following studies were personally reviewed by me today:  EKG:  EKG is ordered today.  The ekg ordered today demonstrates normal sinus rhythm heart rate 71, first-degree AV block, LVH by voltage, and was personally reviewed by me.   Recent Labs: No results found for requested labs within last 365 days.   Recent Lipid Panel    Component Value Date/Time   CHOL  11/04/2007 0424    83        ATP III CLASSIFICATION:  <200     mg/dL   Desirable  200-239  mg/dL   Borderline High  >=240    mg/dL   High   TRIG 60 11/04/2007 0424   HDL 20 (L) 11/04/2007 0424   CHOLHDL 4.2 11/04/2007 0424   VLDL 12 11/04/2007 0424   LDLCALC  11/04/2007 0424    51        Total Cholesterol/HDL:CHD Risk Coronary Heart Disease Risk Table                     Men   Women  1/2 Average Risk   3.4    3.3    Physical Exam:   VS:  BP (!) 85/39 (BP Location: Left Leg, Patient Position: Sitting, Cuff Size: Normal)   Pulse 71   Ht '6\' 3"'$  (1.905 m)   Wt 214 lb (97.1 kg)   BMI 26.75 kg/m    Wt Readings from Last 3 Encounters:  06/09/22 214 lb (97.1 kg)  04/16/21 206 lb (93.4 kg)  10/15/20 218 lb 9.6 oz (99.2 kg)    General: Well nourished, well developed, in no acute distress Head: Atraumatic, normal size  Eyes: PEERLA, EOMI  Neck: Supple, no JVD Endocrine: No thryomegaly Cardiac: Normal S1, S2; RRR; 3-6 systolic ejection murmur, clear S2 is heard Lungs: Clear to auscultation bilaterally, no wheezing, rhonchi or rales  Abd: Soft, nontender, no hepatomegaly  Ext: No edema Musculoskeletal: No deformities, BUE and BLE strength normal and equal Skin: Warm and dry, no rashes   Neuro: Alert and oriented to person, place, time, and situation, CNII-XII grossly intact, no focal deficits  Psych: Normal mood and affect   ASSESSMENT:   [name] is a 51 y.o. male who presents for the following: 1. Nonrheumatic aortic valve stenosis   2. Essential hypertension   3. Hyperlipidemia, unspecified hyperlipidemia type     PLAN:   1. Nonrheumatic aortic valve stenosis -Moderate aortic stenosis last year.  Murmur still consistent with this.  We will repeat an echo.  He is short of breath but symptoms are likely related to anemia.  He is working with his nephrologist as well as hematologist in the near future.  2. Essential hypertension -Blood pressure quite low today.  We will stop his  amlodipine.  He is still on carvedilol 25 mg twice daily.  He tells me his blood pressure is quite erratic.  Unclear if we are getting real values.  Blood pressure to be in the 200s at dialysis.  We will have to closely watch this.  For now he will check a log.  He will continue carvedilol see me back in 6 months to discuss further.  3. Hyperlipidemia, unspecified hyperlipidemia type -Left heart cath in 2014 was  normal.  LDL cholesterol 24 on no medication.  I see no indication for aspirin.  Disposition: Return in about 6 months (around 12/10/2022).  Medication Adjustments/Labs and Tests Ordered: Current medicines are reviewed at length with the patient today.  Concerns regarding medicines are outlined above.  Orders Placed This Encounter  Procedures   EKG 12-Lead   ECHOCARDIOGRAM COMPLETE   No orders of the defined types were placed in this encounter.   Patient Instructions  Medication Instructions:  STOP Aspirin STOP Amlodipine   *If you need a refill on your cardiac medications before your next appointment, please call your pharmacy*   Testing/Procedures:  Echocardiogram - Your physician has requested that you have an echocardiogram. Echocardiography is a painless test that uses sound waves to create images of your heart. It provides your doctor with information about the size and shape of your heart and how well your heart's chambers and valves are working. This procedure takes approximately one hour. There are no restrictions for this procedure.     Follow-Up: At [facility], you and your health needs are our priority.  As part of our continuing mission to provide you with exceptional heart care, we have created designated Provider Care Teams.  These Care Teams include your primary Cardiologist (physician) and Advanced Practice Providers (APPs -  Physician Assistants and Nurse Practitioners) who all work together to provide you with the care you need, when you need it.  We recommend signing up for the patient portal called "MyChart".  Sign up information is provided on this After Visit Summary.  MyChart is used to connect with patients for Virtual Visits (Telemedicine).  Patients are able to view lab/test results, encounter notes, upcoming appointments, etc.  Non-urgent messages can be sent to your provider as well.   To learn more about what you can do with MyChart, go to  [email or web address].    Your next appointment:   6 month(s)  The format for your next appointment:   In Person  Provider:   Eleonore Chiquito, MD            Time Spent with Patient: I have spent a total of 30 minutes with patient reviewing hospital notes, telemetry, EKGs, labs and examining the patient as well as establishing an assessment and plan that was discussed with the patient.  > 50% of time was spent in direct patient care.  Signed, Addison Naegeli. Audie Box, MD, [facility]  [address] [place], [place] [address] [phone]  06/09/2022 9:09 AM

## 2022-06-09 ENCOUNTER — Encounter: Payer: Self-pay | Admitting: Cardiovascular Disease

## 2022-06-09 ENCOUNTER — Ambulatory Visit (INDEPENDENT_AMBULATORY_CARE_PROVIDER_SITE_OTHER): Payer: Medicare HMO | Admitting: Cardiovascular Disease

## 2022-06-09 VITALS — BP 85/39 | HR 71 | Ht 75.0 in | Wt 214.0 lb

## 2022-06-09 DIAGNOSIS — I350 Nonrheumatic aortic (valve) stenosis: Secondary | ICD-10-CM | POA: Diagnosis not present

## 2022-06-09 DIAGNOSIS — I10 Essential (primary) hypertension: Secondary | ICD-10-CM

## 2022-06-09 DIAGNOSIS — E785 Hyperlipidemia, unspecified: Secondary | ICD-10-CM

## 2022-06-09 NOTE — Patient Instructions (Signed)
Medication Instructions:  STOP Aspirin STOP Amlodipine   *If you need a refill on your cardiac medications before your next appointment, please call your pharmacy*   Testing/Procedures:  Echocardiogram - Your physician has requested that you have an echocardiogram. Echocardiography is a painless test that uses sound waves to create images of your heart. It provides your doctor with information about the size and shape of your heart and how well your heart's chambers and valves are working. This procedure takes approximately one hour. There are no restrictions for this procedure.     Follow-Up: At [facility], you and your health needs are our priority.  As part of our continuing mission to provide you with exceptional heart care, we have created designated Provider Care Teams.  These Care Teams include your primary Cardiologist (physician) and Advanced Practice Providers (APPs -  Physician Assistants and Nurse Practitioners) who all work together to provide you with the care you need, when you need it.  We recommend signing up for the patient portal called "MyChart".  Sign up information is provided on this After Visit Summary.  MyChart is used to connect with patients for Virtual Visits (Telemedicine).  Patients are able to view lab/test results, encounter notes, upcoming appointments, etc.  Non-urgent messages can be sent to your provider as well.   To learn more about what you can do with MyChart, go to [email or web address].    Your next appointment:   6 month(s)  The format for your next appointment:   In Person  Provider:   Eleonore Chiquito, MD

## 2022-06-23 ENCOUNTER — Ambulatory Visit (HOSPITAL_COMMUNITY): Payer: Medicare HMO

## 2022-06-27 ENCOUNTER — Other Ambulatory Visit: Payer: Self-pay

## 2022-06-27 ENCOUNTER — Telehealth: Payer: Self-pay | Admitting: Cardiovascular Disease

## 2022-06-27 MED ORDER — AMLODIPINE BESYLATE 2.5 MG PO TABS: 2.5000 mg | ORAL_TABLET | Freq: Every day | ORAL | 3 refills | Status: DC

## 2022-06-27 NOTE — Telephone Encounter (Signed)
Patient stated that amlodipine '5mg'$  daily was stopped. During HD, BP increasing. Nephrologist faxed BP readings on Friday 8/18 to Dr. Audie Box. Nephrologist wants to know if patient can be on amlodipine 2.'5mg'$  daily.

## 2022-06-27 NOTE — Telephone Encounter (Signed)
The nephrologist had faxed over BP reading for this patient on Friday. Since BP medications had been changed on this patient.  The nephrologist want to know if 2.'5mg'$  of amlodipine can be added.  If so patient would like for it to be sent to  [place], [place]. Phone:  [phone]  Fax:  [phone]

## 2022-07-07 ENCOUNTER — Ambulatory Visit (HOSPITAL_COMMUNITY): Payer: Medicare HMO | Attending: Internal Medicine

## 2022-07-07 DIAGNOSIS — I350 Nonrheumatic aortic (valve) stenosis: Secondary | ICD-10-CM

## 2022-07-07 LAB — ECHOCARDIOGRAM COMPLETE
AR max vel: 2.31 cm2
AV Area VTI: 2.19 cm2
AV Area mean vel: 2.11 cm2
AV Mean grad: 11 mmHg
AV Peak grad: 20.6 mmHg
Ao pk vel: 2.27 m/s
Area-P 1/2: 3.42 cm2
MV M vel: 3.43 m/s
MV Peak grad: 47 mmHg
P 1/2 time: 578 msec
Radius: 1 cm
S' Lateral: 4.9 cm

## 2022-07-08 MED ORDER — PERFLUTREN LIPID MICROSPHERE: 1.0000 mL | INTRAVENOUS | Status: DC | PRN

## 2022-07-24 NOTE — Progress Notes (Unsigned)
Cardiology Office Note:   Date:  07/26/2022  NAME:  [name]    MRN: [id number] DOB:  [birth year]   PCP:  Joesph July, MD  Cardiologist:  None  Electrophysiologist:  None   Referring MD: Joesph July, MD   Chief Complaint  Patient presents with   Follow-up        History of Present Illness:   [name] is a 51 y.o. male with a hx of ESRD who presents for follow-up. EF has dropped. Severe MR/TR. Suspect AS has worsened.  He reports no change in symptoms.  His anemia actually had improved.  He is tolerating dialysis well.  His blood pressure is chronically low.  Values remain between 32-35 systolically.  He is not having issues with dialysis.  He reports no chest pain or trouble breathing.  We discussed the results of his echocardiogram which show his ejection fraction has dropped.  He now has severe mitral regurgitation as well as severe tricuspid regurgitation and significant pulmonary hypertension.  His aortic valve was moderate and now interpreted as mild.  We discussed that his aortic valve likely is worsening.  His murmur is still inconsistent with severe aortic stenosis.  Overall is doing well.  We discussed transesophageal echocardiogram.  He is a candidate for this.  This will further evaluate the aortic valve, mitral valve and tricuspid valve.  He would like to hold off as he is starting a new school program.  He will let me know if he can do it in the next few weeks.  He seems to be euvolemic on exam.  Murmur again still consistent with moderate aortic stenosis.  Problem list 1. 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 HTN 3. Perianal condyloma  4. Moderate aortic stenosis -V max 3.1 m/s, MG 21 mmHG, AVA 1.5 cm2 05/2020 -Vmax 3.0 m/s, MG 20 mmHG 5. Chronic anemia  6. HLD -T chol 67, HDL 28, LDL 24, TG 83 7. Systolic HF, 57-32%  Past Medical History: Past Medical History:  Diagnosis Date   ADHD  (attention deficit hyperactivity disorder)    Anemia    Anemia, chronic disease 07/13/2011   Anxiety    Arthritis    Bipolar 2 disorder ([place]) 08/18/2011   Diagnosed in 2011.    Bipolar disorder ([place])    Blood transfusion    Blood transfusion without reported diagnosis    Chronic diastolic CHF (congestive heart failure) (HCC)    Complication of anesthesia    Woke up intubated gets combative  afraid to be alone    Congestive heart failure (CHF) ([place]) 07/13/2011   Onset 2006.  Followed by Dr. Johnsie Cancel.  S/p cardiac catheterization in [facility] Dr. Marlou Porch..  S/p cardiac catheterization in 2014 by Nishan.  Echo 2014.    Depression    Erectile dysfunction    ESRD on hemodialysis ([place]) 07/13/2011   Glomerulonephritis at age 51, started HD in 66.  Deceased donor renal transplant 1999 at [facility], then kidney failed and went back on HD in 2006.  L forearm AVF . s/p repeat renal transplant 10/2014 [facility].   GI bleed 11/01/2011   Rectal bleeding with emesis/diarrhea    Hemodialysis status ([place])    Hydrocele, right 11/25/2014   Hypertension    Hypertensive emergency 01/06/2013   Hypoglycemia 01/11/2013   Influenza-like illness 12/18/2013   Mild ascending aorta dilatation (HCC)    NICM (nonischemic cardiomyopathy) (HCC)    Pericardial effusion  a. Mod by echo in 2014, similar to prior.   Peripheral polyneuropathy 08/04/2016   Pulmonary edema 01/06/2013   Pulmonary hypertension associated with end stage renal disease on dialysis [facility]) 07/13/2011   Renal transplant, status post 11/25/2014   Pt with Glomerulonephritis at age 47. Deceased donor renal transplant 1999 right and left renal transplant 10/2014.  Followed by [facility] transplant team; nephrologist is Dr. Barbette Merino.    Respiratory failure with hypoxia ([place]) 01/06/2013   Shortness of breath 12/12/2012   Swollen testicle 07/13/2011   URI (upper respiratory infection) 01/25/2012    Past Surgical History: Past Surgical History:  Procedure Laterality Date    ANGIOPLASTY     CHOLECYSTECTOMY     DIALYSIS FISTULA CREATION     EVALUATION UNDER ANESTHESIA WITH HEMORRHOIDECTOMY N/A 03/15/2020   Procedure: CONTROL OF ANAL BLEEDING;  Surgeon: Ileana Roup, MD;  Location: [facility];  Service: General;  Laterality: N/A;   KIDNEY TRANSPLANT  10/17/14   KNEE SURGERY     LEFT HEART CATHETERIZATION WITH CORONARY ANGIOGRAM N/A 10/01/2013   Procedure: LEFT HEART CATHETERIZATION WITH CORONARY ANGIOGRAM;  Surgeon: [name], MD;  Location: [facility] CATH LAB;  Service: Cardiovascular;  Laterality: N/A;   MOUTH SURGERY     NEPHRECTOMY TRANSPLANTED ORGAN      Current Medications: Current Meds  Medication Sig   amLODipine (NORVASC) 2.5 MG tablet Take 1 tablet (2.5 mg total) by mouth daily.   amphetamine-dextroamphetamine (ADDERALL XR) 20 MG 24 hr capsule Take 20 mg by mouth daily.   amphetamine-dextroamphetamine (ADDERALL) 10 MG tablet Take 10 mg by mouth daily.    ARIPiprazole (ABILIFY) 20 MG tablet Take 20 mg by mouth daily.   calcium carbonate (OS-CAL) 1250 (500 Ca) MG chewable tablet Chew 1 tablet by mouth as needed.   carvedilol (COREG) 25 MG tablet Take 1 tablet (25 mg total) by mouth 2 times daily with meals.   clonazePAM (KLONOPIN) 1 MG tablet Take 0.3 mg by mouth daily.    HYDROcodone-acetaminophen (NORCO) 7.5-325 MG tablet Take by mouth.   lidocaine-prilocaine (EMLA) cream Apply topically.   silver sulfADIAZINE (SILVADENE) 1 % cream Apply 1 application topically in the morning and at bedtime. To open sores.     Allergies:    Delacort [hydrocortisone base]; Benicar [olmesartan]; Food; Hydralazine; and Nitrates, organic   Social History: Social History   Socioeconomic History   Marital status: Single    Spouse name: Not on file   Number of children: 0   Years of education: 20+   Highest education level: Not on file  Occupational History   Occupation: Chef- fulltime  Tobacco Use   Smoking status: Former    Types: Cigars   Smokeless  tobacco: Never  Scientific laboratory technician Use: Never used  Substance and Sexual Activity   Alcohol use: No    Alcohol/week: 1.0 standard drink of alcohol    Types: 1 Standard drinks or equivalent per week    Comment: Stopped drinking after Kidney transplant in December 2015   Drug use: No   Sexual activity: Yes  Other Topics Concern   Not on file  Social History Narrative   Marital status: single; dating      Children; none      Lives: alone      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      Tobacco: none      Alcohol: none  Drugs: none      Exercise: cycling; 3 days per week.      Sexual activity: females only; no male encounters; Chlamydia age 45.  Total partner = 55.  Condoms.      Caffeine use: Soda sometimes   Social Determinants of Radio broadcast assistant Strain: Not on file  Food Insecurity: Not on file  Transportation Needs: Not on file  Physical Activity: Not on file  Stress: Not on file  Social Connections: Not on file     Family History: The patient's family history includes Arthritis in his father and mother; Depression in his father and mother; Diabetes in his father and mother; Hypertension in his father and mother.  ROS:   All other ROS reviewed and negative. Pertinent positives noted in the HPI.     EKGs/Labs/Other Studies Reviewed:   The following studies were personally reviewed by me today:  TTE 07/07/2022  1. Left ventricular ejection fraction, by estimation, is 45 to 50%. The  left ventricle has mildly decreased function. The left ventricle  demonstrates global hypokinesis. The left ventricular internal cavity size  was moderately dilated. There is moderate   left ventricular hypertrophy. Left ventricular diastolic parameters are  consistent with Grade II diastolic dysfunction (pseudonormalization).  Elevated left ventricular end-diastolic pressure. There is the  interventricular septum is  flattened in systole  and diastole, consistent with right ventricular pressure and volume  overload.   2. Right ventricular systolic function is mildly reduced. The right  ventricular size is severely enlarged. There is mildly elevated pulmonary  artery systolic pressure. The estimated right ventricular systolic  pressure is 01.0 mmHg.   3. Left atrial size was moderately dilated.   4. Right atrial size was severely dilated.   5. The mitral valve is degenerative. Moderate to severe mitral valve  regurgitation.   6. The tricuspid valve is abnormal. Tricuspid valve regurgitation is  severe.   7. The aortic valve is tricuspid. There is moderate calcification of the  aortic valve. There is mild thickening of the aortic valve. Aortic valve  regurgitation is trivial. Mild aortic valve stenosis. Aortic regurgitation  PHT measures 578 msec. Aortic  valve area, by VTI measures 2.19 cm. Aortic valve mean gradient measures  11.0 mmHg. Aortic valve Vmax measures 2.27 m/s. DI is 0.49, peak gradient  20.6 mmHg.   8. Aortic dilatation noted. There is mild dilatation of the aortic root,  measuring 41 mm. There is borderline dilatation of the ascending aorta,  measuring 39 mm.   9. The inferior vena cava is dilated in size with <50% respiratory  variability, suggesting right atrial pressure of 15 mmHg.  10. Evidence of atrial level shunting detected by color flow Doppler.  There is a moderately sized patent foramen ovale with predominantly left  to right shunting across the atrial septum.   Recent Labs: No results found for requested labs within last 365 days.   Recent Lipid Panel    Component Value Date/Time   CHOL  11/04/2007 0424    83        ATP III CLASSIFICATION:  <200     mg/dL   Desirable  200-239  mg/dL   Borderline High  >=240    mg/dL   High   TRIG 60 11/04/2007 0424   HDL 20 (L) 11/04/2007 0424   CHOLHDL 4.2 11/04/2007 0424   VLDL 12 11/04/2007 0424   [place]  11/04/2007 0424     51  Total Cholesterol/HDL:CHD Risk Coronary Heart Disease Risk Table                     Men   Women  1/2 Average Risk   3.4   3.3    Physical Exam:   VS:  Pulse 65   Ht '6\' 3"'$  (1.905 m)   Wt 216 lb 9.6 oz (98.2 kg)   SpO2 97%   BMI 27.07 kg/m    Wt Readings from Last 3 Encounters:  07/26/22 216 lb 9.6 oz (98.2 kg)  06/09/22 214 lb (97.1 kg)  04/16/21 206 lb (93.4 kg)    General: Well nourished, well developed, in no acute distress Head: Atraumatic, normal size  Eyes: PEERLA, EOMI  Neck: Supple, no JVD Endocrine: No thryomegaly Cardiac: Normal S1, S2; RRR; 3 out of 6 systolic ejection murmur, S2 is clearly heard Lungs: Clear to auscultation bilaterally, no wheezing, rhonchi or rales  Abd: Soft, nontender, no hepatomegaly  Ext: No edema, pulses 2+ Musculoskeletal: No deformities, BUE and BLE strength normal and equal Skin: Warm and dry, no rashes   Neuro: Alert and oriented to person, place, time, and situation, CNII-XII grossly intact, no focal deficits  Psych: Normal mood and affect   ASSESSMENT:   [name] is a 51 y.o. male who presents for the following: 1. Nonrheumatic aortic valve stenosis   2. Nonrheumatic mitral valve regurgitation   3. Nonrheumatic tricuspid valve regurgitation   4. Pulmonary hypertension, unspecified ([place])   5. Hypotension, unspecified hypotension type   6. Chronic systolic heart failure (HCC)     PLAN:   1. Nonrheumatic aortic valve stenosis 2 Nonrheumatic mitral valve regurgitation 3. Nonrheumatic tricuspid valve regurgitation 4. Pulmonary hypertension, unspecified ([place]) 5. Hypotension, unspecified hypotension type 6. Chronic systolic heart failure (HCC) -Echocardiogram shows ejection fraction is now 45 to 50%.  He now has severe mitral regurgitation as well as severe tricuspid regurgitation.  He has had moderate aortic stenosis and I suspect this may have worsened.  This could explain his drop in ejection fraction if he does  have severe aortic stenosis as well as his other valvular heart issues.  His murmur is still consistent with moderate AAS.  However he has been struggling with anemia.  I wonder if he is in some sort of high-output heart failure.  We discussed transesophageal echo as this would be a good way to evaluate all of his valves.  I suspect some of this could be related to volume.  In the setting of volume overload regurgitant lesions always look worse.  He seems to be tolerating dialysis well.  He does suffer from chronic hypotension which complicates medical therapy for his cardiomyopathy.  Given his lack of symptoms and ejection fraction close to 50% I favor conservative approach.  We will plan to set him up for transesophageal echocardiogram pending his school schedule.  If his school would not allow him to take a break we will plan for repeat echo in 6 months.  Again his aortic valve to me is likely still moderate.  He is cardiomyopathy could be due to to high-output heart failure in the setting of 2 AV fistulas with one recently taken down.  He also has suffered from anemia.  I will plan to see him back in 6 months.  Again he is asymptomatic from a valvular heart disease standpoint.  Seems to be doing well.  We will set him up for transesophageal echo once he lets me know if  this is feasible.   Shared Decision Making/Informed Consent The risks [esophageal damage, perforation (1:10,000 risk), bleeding, pharyngeal hematoma as well as other potential complications associated with conscious sedation including aspiration, arrhythmia, respiratory failure and death], benefits (treatment guidance and diagnostic support) and alternatives of a transesophageal echocardiogram were discussed in detail with [name] and he is willing to proceed.   Disposition: Return in about 6 months (around 01/24/2023).  Medication Adjustments/Labs and Tests Ordered: Current medicines are reviewed at length with the patient today.   Concerns regarding medicines are outlined above.  Orders Placed This Encounter  Procedures   ECHOCARDIOGRAM COMPLETE   No orders of the defined types were placed in this encounter.   Patient Instructions  Medication Instructions:  The current medical regimen is effective;  continue present plan and medications.  *If you need a refill on your cardiac medications before your next appointment, please call your pharmacy*   Testing/Procedures: Echocardiogram (6 months)  - Your physician has requested that you have an echocardiogram. Echocardiography is a painless test that uses sound waves to create images of your heart. It provides your doctor with information about the size and shape of your heart and how well your heart's chambers and valves are working. This procedure takes approximately one hour. There are no restrictions for this procedure.     Follow-Up: At [facility], you and your health needs are our priority.  As part of our continuing mission to provide you with exceptional heart care, we have created designated Provider Care Teams.  These Care Teams include your primary Cardiologist (physician) and Advanced Practice Providers (APPs -  Physician Assistants and Nurse Practitioners) who all work together to provide you with the care you need, when you need it.  We recommend signing up for the patient portal called "MyChart".  Sign up information is provided on this After Visit Summary.  MyChart is used to connect with patients for Virtual Visits (Telemedicine).  Patients are able to view lab/test results, encounter notes, upcoming appointments, etc.  Non-urgent messages can be sent to your provider as well.   To learn more about what you can do with MyChart, go to [email or web address].    Your next appointment:   6 month(s)  The format for your next appointment:   In Person  Provider:   Eleonore Chiquito, MD          Time Spent with Patient: I have spent a  total of 35 minutes with patient reviewing hospital notes, telemetry, EKGs, labs and examining the patient as well as establishing an assessment and plan that was discussed with the patient.  > 50% of time was spent in direct patient care.  Signed, Addison Naegeli. Audie Box, MD, [place]  [address] [place], [place] [address] [phone]  07/26/2022 10:41 PM

## 2022-07-26 ENCOUNTER — Telehealth: Payer: Self-pay | Admitting: Cardiovascular Disease

## 2022-07-26 ENCOUNTER — Encounter: Payer: Self-pay | Admitting: Cardiovascular Disease

## 2022-07-26 ENCOUNTER — Ambulatory Visit: Payer: Medicare HMO | Attending: Cardiovascular Disease | Admitting: Cardiovascular Disease

## 2022-07-26 VITALS — HR 65 | Ht 75.0 in | Wt 216.6 lb

## 2022-07-26 DIAGNOSIS — I272 Pulmonary hypertension, unspecified: Secondary | ICD-10-CM | POA: Diagnosis not present

## 2022-07-26 DIAGNOSIS — I340 Nonrheumatic mitral (valve) insufficiency: Secondary | ICD-10-CM | POA: Diagnosis not present

## 2022-07-26 DIAGNOSIS — I350 Nonrheumatic aortic (valve) stenosis: Secondary | ICD-10-CM

## 2022-07-26 DIAGNOSIS — I361 Nonrheumatic tricuspid (valve) insufficiency: Secondary | ICD-10-CM

## 2022-07-26 DIAGNOSIS — I959 Hypotension, unspecified: Secondary | ICD-10-CM

## 2022-07-26 DIAGNOSIS — I5022 Chronic systolic (congestive) heart failure: Secondary | ICD-10-CM

## 2022-07-26 NOTE — Patient Instructions (Signed)
Medication Instructions:  The current medical regimen is effective;  continue present plan and medications.  *If you need a refill on your cardiac medications before your next appointment, please call your pharmacy*   Testing/Procedures: Echocardiogram (6 months)  - Your physician has requested that you have an echocardiogram. Echocardiography is a painless test that uses sound waves to create images of your heart. It provides your doctor with information about the size and shape of your heart and how well your heart's chambers and valves are working. This procedure takes approximately one hour. There are no restrictions for this procedure.     Follow-Up: At [facility], you and your health needs are our priority.  As part of our continuing mission to provide you with exceptional heart care, we have created designated Provider Care Teams.  These Care Teams include your primary Cardiologist (physician) and Advanced Practice Providers (APPs -  Physician Assistants and Nurse Practitioners) who all work together to provide you with the care you need, when you need it.  We recommend signing up for the patient portal called "MyChart".  Sign up information is provided on this After Visit Summary.  MyChart is used to connect with patients for Virtual Visits (Telemedicine).  Patients are able to view lab/test results, encounter notes, upcoming appointments, etc.  Non-urgent messages can be sent to your provider as well.   To learn more about what you can do with MyChart, go to [email or web address].    Your next appointment:   6 month(s)  The format for your next appointment:   In Person  Provider:   Eleonore Chiquito, MD

## 2022-07-26 NOTE — Telephone Encounter (Signed)
Patient called back . He states he will be able to miss  few days in hs upcoming  program that his starting on Monday.  Patient states if Dr Jenetta DownerNori Riis think it is best for him to to do the first option  that was discussed  at appointment.patient is in agreement . Patient states that Tuesday or Thursday are best because he has dialysis the other days. Patient aware will defer to Dr Jenetta Downer' Nori Riis

## 2022-07-26 NOTE — Telephone Encounter (Signed)
Pt calling to inform provider that he is now willing to have procedure that was discussed at today's visit. Pt would like a callback . Please advise

## 2022-07-27 DIAGNOSIS — I340 Nonrheumatic mitral (valve) insufficiency: Secondary | ICD-10-CM

## 2022-07-27 DIAGNOSIS — I361 Nonrheumatic tricuspid (valve) insufficiency: Secondary | ICD-10-CM

## 2022-07-27 NOTE — Telephone Encounter (Signed)
I sent fax via fax to [facility]- Fax number [phone].

## 2022-07-27 NOTE — Telephone Encounter (Signed)
Called patient, advised I would call and get him scheduled. We worked with his scheduled. He is scheduled with Dr.O'Neal for TEE on October 26 at 11:00- arrival time is 10:00. Patient aware of this. Instructions sent via mychart and I will mail to him today as well.   Patient discussed instructions over the phone. Aware he will need updated BMET, CBC (I will contact dialysis center, as they will draw these the week before the TEE) I will fax over today.

## 2022-07-28 ENCOUNTER — Telehealth: Payer: Self-pay | Admitting: Cardiovascular Disease

## 2022-07-28 NOTE — Telephone Encounter (Signed)
Patient calling in to get his procedure resch that on 10/26. Please advise

## 2022-07-28 NOTE — Telephone Encounter (Signed)
Thank you :)

## 2022-07-28 NOTE — Telephone Encounter (Signed)
Returned call to patient, he needs his TEE rescheduled.   Patient states Monday 10/30 with Dr. Audie Box will work.  Called and rescheduled TEE.    Currently scheduled for 10/30 with Dr. Audie Box at 10 AM (arrive at [facility]).   Patient aware and verbalized understanding.

## 2022-09-05 ENCOUNTER — Ambulatory Visit (HOSPITAL_COMMUNITY)
Admission: RE | Admit: 2022-09-05 | Discharge: 2022-09-05 | Disposition: A | Discharge status: Home / Self Care | Payer: Medicare HMO | Attending: Cardiovascular Disease | Admitting: Cardiovascular Disease

## 2022-09-05 ENCOUNTER — Encounter (HOSPITAL_COMMUNITY): Payer: Self-pay | Admitting: Cardiovascular Disease

## 2022-09-05 ENCOUNTER — Ambulatory Visit (HOSPITAL_BASED_OUTPATIENT_CLINIC_OR_DEPARTMENT_OTHER)
Admission: RE | Admit: 2022-09-05 | Discharge: 2022-09-05 | Disposition: A | Discharge status: Home / Self Care | Payer: Medicare HMO | Source: Ambulatory Visit | Attending: Cardiovascular Disease | Admitting: Cardiovascular Disease

## 2022-09-05 ENCOUNTER — Ambulatory Visit (HOSPITAL_BASED_OUTPATIENT_CLINIC_OR_DEPARTMENT_OTHER): Payer: Medicare HMO | Admitting: Certified Registered"

## 2022-09-05 ENCOUNTER — Other Ambulatory Visit: Payer: Self-pay

## 2022-09-05 ENCOUNTER — Ambulatory Visit (HOSPITAL_COMMUNITY): Payer: Medicare HMO | Admitting: Certified Registered"

## 2022-09-05 ENCOUNTER — Encounter (HOSPITAL_COMMUNITY)
Admission: RE | Disposition: A | Discharge status: Home / Self Care | Payer: Self-pay | Source: Home / Self Care | Attending: Cardiovascular Disease

## 2022-09-05 DIAGNOSIS — I132 Hypertensive heart and chronic kidney disease with heart failure and with stage 5 chronic kidney disease, or end stage renal disease: Secondary | ICD-10-CM | POA: Diagnosis not present

## 2022-09-05 DIAGNOSIS — D631 Anemia in chronic kidney disease: Secondary | ICD-10-CM

## 2022-09-05 DIAGNOSIS — Z041 Encounter for examination and observation following transport accident: Secondary | ICD-10-CM | POA: Insufficient documentation

## 2022-09-05 DIAGNOSIS — I350 Nonrheumatic aortic (valve) stenosis: Secondary | ICD-10-CM

## 2022-09-05 DIAGNOSIS — I361 Nonrheumatic tricuspid (valve) insufficiency: Secondary | ICD-10-CM | POA: Diagnosis not present

## 2022-09-05 DIAGNOSIS — N186 End stage renal disease: Secondary | ICD-10-CM | POA: Insufficient documentation

## 2022-09-05 DIAGNOSIS — Z87891 Personal history of nicotine dependence: Secondary | ICD-10-CM | POA: Insufficient documentation

## 2022-09-05 DIAGNOSIS — I083 Combined rheumatic disorders of mitral, aortic and tricuspid valves: Secondary | ICD-10-CM | POA: Insufficient documentation

## 2022-09-05 DIAGNOSIS — Z992 Dependence on renal dialysis: Secondary | ICD-10-CM | POA: Insufficient documentation

## 2022-09-05 DIAGNOSIS — I3139 Other pericardial effusion (noninflammatory): Secondary | ICD-10-CM | POA: Insufficient documentation

## 2022-09-05 DIAGNOSIS — I5022 Chronic systolic (congestive) heart failure: Secondary | ICD-10-CM | POA: Diagnosis not present

## 2022-09-05 DIAGNOSIS — I110 Hypertensive heart disease with heart failure: Secondary | ICD-10-CM | POA: Insufficient documentation

## 2022-09-05 DIAGNOSIS — I340 Nonrheumatic mitral (valve) insufficiency: Secondary | ICD-10-CM

## 2022-09-05 DIAGNOSIS — Z940 Kidney transplant status: Secondary | ICD-10-CM | POA: Insufficient documentation

## 2022-09-05 DIAGNOSIS — I509 Heart failure, unspecified: Secondary | ICD-10-CM

## 2022-09-05 HISTORY — PX: TEE WITHOUT CARDIOVERSION: SHX5443

## 2022-09-05 HISTORY — PX: BUBBLE STUDY: SHX6837

## 2022-09-05 LAB — POCT I-STAT, CHEM 8
BUN: 44 mg/dL — ABNORMAL HIGH (ref 6–20)
Calcium, Ion: 0.97 mmol/L — ABNORMAL LOW (ref 1.15–1.40)
Chloride: 103 mmol/L (ref 98–111)
Creatinine, Ser: 12.7 mg/dL — ABNORMAL HIGH (ref 0.61–1.24)
Glucose, Bld: 76 mg/dL (ref 70–99)
HCT: 41 % (ref 39.0–52.0)
Hemoglobin: 13.9 g/dL (ref 13.0–17.0)
Potassium: 4.9 mmol/L (ref 3.5–5.1)
Sodium: 138 mmol/L (ref 135–145)
TCO2: 26 mmol/L (ref 22–32)

## 2022-09-05 LAB — ECHO TEE
AR max vel: 2.05 cm2
AV Area VTI: 2.21 cm2
AV Area mean vel: 2.03 cm2
AV Mean grad: 14.6 mmHg
AV Peak grad: 25.1 mmHg
Ao pk vel: 2.51 m/s
MV M vel: 6.03 m/s
MV Peak grad: 145.4 mmHg
Radius: 0.8 cm

## 2022-09-05 SURGERY — ECHOCARDIOGRAM, TRANSESOPHAGEAL
Anesthesia: Monitor Anesthesia Care

## 2022-09-05 MED ORDER — PROPOFOL 10 MG/ML IV BOLUS
INTRAVENOUS | Status: DC | PRN
  Administered 2022-09-05 (×2): 20 mg via INTRAVENOUS
  Administered 2022-09-05: 10 mg via INTRAVENOUS
  Administered 2022-09-05 (×3): 20 mg via INTRAVENOUS

## 2022-09-05 MED ORDER — LIDOCAINE 2% (20 MG/ML) 5 ML SYRINGE
INTRAMUSCULAR | Status: DC | PRN
  Administered 2022-09-05: 40 mg via INTRAVENOUS

## 2022-09-05 MED ORDER — DEXMEDETOMIDINE HCL IN NACL 80 MCG/20ML IV SOLN
INTRAVENOUS | Status: DC | PRN
  Administered 2022-09-05: 4 ug via BUCCAL
  Administered 2022-09-05 (×2): 8 ug via BUCCAL
  Administered 2022-09-05: 4 ug via BUCCAL

## 2022-09-05 MED ORDER — SODIUM CHLORIDE 0.9 % IV SOLN: INTRAVENOUS | Status: DC | PRN

## 2022-09-05 MED ORDER — BUTAMBEN-TETRACAINE-BENZOCAINE 2-2-14 % EX AERO
INHALATION_SPRAY | CUTANEOUS | Status: DC | PRN
  Administered 2022-09-05: 1 via TOPICAL

## 2022-09-05 MED ORDER — DEXAMETHASONE SODIUM PHOSPHATE 10 MG/ML IJ SOLN: INTRAMUSCULAR | Status: DC | PRN

## 2022-09-05 MED ORDER — PROPOFOL 500 MG/50ML IV EMUL
INTRAVENOUS | Status: DC | PRN
  Administered 2022-09-05: 150 ug/kg/min via INTRAVENOUS

## 2022-09-05 MED ORDER — SODIUM CHLORIDE 0.9 % IV SOLN: INTRAVENOUS | Status: DC

## 2022-09-05 MED ORDER — SODIUM CHLORIDE 0.9 % IV SOLN
INTRAVENOUS | Status: AC | PRN
  Administered 2022-09-05: 500 mL via INTRAVENOUS

## 2022-09-05 NOTE — CV Procedure (Signed)
    TRANSESOPHAGEAL ECHOCARDIOGRAM   NAME:  [name]    MRN: [id number] DOB:  05-Apr-1971    ADMIT DATE: 09/05/2022  INDICATIONS: Severe TR/Severe MR/AS  PROCEDURE:   Informed consent was obtained prior to the procedure. The risks, benefits and alternatives for the procedure were discussed and the patient comprehended these risks.  Risks include, but are not limited to, cough, sore throat, vomiting, nausea, somnolence, esophageal and stomach trauma or perforation, bleeding, low blood pressure, aspiration, pneumonia, infection, trauma to the teeth and death.    Procedural time out performed. The oropharynx was anesthetized with topical 1% benzocaine.    Anesthesia was administered by Dr. Rex Kras.  The patient was administered 175 mg of propofol and 40 mg of lidocaine to achieve and maintain moderate conscious sedation.  The patient's heart rate, blood pressure, and oxygen saturation are monitored continuously during the procedure. The period of conscious sedation is 37 minutes, of which I was present face-to-face 100% of this time.   The transesophageal probe was inserted in the esophagus and stomach without difficulty and multiple views were obtained.   COMPLICATIONS:    There were no immediate complications.  KEY FINDINGS:  Severe MR. Flail A1.  Severe TR.  Moderate AS.  Full report to follow. Further management per primary team.   [name], MD, [name]  [address] [place], [place] [address] [phone]  10:50 AM

## 2022-09-05 NOTE — Transfer of Care (Signed)
Immediate Anesthesia Transfer of Care Note  Patient: [name]  Procedure(s) Performed: TRANSESOPHAGEAL ECHOCARDIOGRAM (TEE)  Patient Location: PACU  Anesthesia Type:MAC  Level of Consciousness: awake, drowsy and patient cooperative  Airway & Oxygen Therapy: Patient Spontanous Breathing and Patient connected to nasal cannula oxygen  Post-op Assessment: Report given to RN, Post -op Vital signs reviewed and stable and Patient moving all extremities X 4  Post vital signs: Reviewed and stable  Last Vitals:  Vitals Value Taken Time  BP 120/83 09/05/22 1100  Temp 36.5 C 09/05/22 1057  Pulse 66 09/05/22 1101  Resp 19 09/05/22 1101  SpO2 98 % 09/05/22 1101  Vitals shown include unvalidated device data.  Last Pain:  Vitals:   09/05/22 1057  TempSrc:   PainSc: 0-No pain      Patients Stated Pain Goal: 4 (29/84/73 0856)  Complications: No notable events documented.

## 2022-09-05 NOTE — H&P (Signed)
Cardiology Admission History and Physical:  Patient ID: [name] MRN: [id number] DOB: 25-Oct-1971  Admit date: 09/05/2022  Primary Care Provider: Joesph July, MD Primary Cardiologist: None  Primary Electrophysiologist:  None   Chief Complaint:  Aortic stenosis   Patient Profile:  [name] is a 51 y.o. male with ESRD on hemodialysis, moderate aortic stenosis, severe mitral valve regurgitation, severe tricuspid valve regurgitation, hypertension, systolic heart failure who presents for transesophageal echocardiogram for further evaluation of multi valvular heart disease.  History of Present Illness:  [name] presents n.p.o. for transesophageal echo.  Denies any chest pain or trouble breathing.  Tolerating dialysis well.  Significant murmur noted on examination.   Heart Pathway Score:       Past Medical History: Past Medical History:  Diagnosis Date   ADHD (attention deficit hyperactivity disorder)    Anemia    Anemia, chronic disease 07/13/2011   Anxiety    Arthritis    Bipolar 2 disorder ([place]) 08/18/2011   Diagnosed in 2011.    Bipolar disorder ([place])    Blood transfusion    Blood transfusion without reported diagnosis    Chronic diastolic CHF (congestive heart failure) (HCC)    Complication of anesthesia    Woke up intubated gets combative  afraid to be alone    Congestive heart failure (CHF) ([place]) 07/13/2011   Onset 2006.  Followed by Dr. Johnsie Cancel.  S/p cardiac catheterization in [facility] Dr. Marlou Porch..  S/p cardiac catheterization in 2014 by Nishan.  Echo 2014.    Depression    Erectile dysfunction    ESRD on hemodialysis ([place]) 07/13/2011   Glomerulonephritis at age 4, started HD in 79.  Deceased donor renal transplant 1999 at [facility], then kidney failed and went back on HD in 2006.  L forearm AVF . s/p repeat renal transplant 10/2014 [facility].   GI bleed 11/01/2011   Rectal bleeding with emesis/diarrhea    Hemodialysis status ([place])    Hydrocele, right 11/25/2014    Hypertension    Hypertensive emergency 01/06/2013   Hypoglycemia 01/11/2013   Influenza-like illness 12/18/2013   Mild ascending aorta dilatation (HCC)    NICM (nonischemic cardiomyopathy) (HCC)    Pericardial effusion    a. Mod by echo in 2014, similar to prior.   Peripheral polyneuropathy 08/04/2016   Pulmonary edema 01/06/2013   Pulmonary hypertension associated with end stage renal disease on dialysis [facility]) 07/13/2011   Renal transplant, status post 11/25/2014   Pt with Glomerulonephritis at age 80. Deceased donor renal transplant 1999 right and left renal transplant 10/2014.  Followed by [facility] transplant team; nephrologist is Dr. Barbette Merino.    Respiratory failure with hypoxia ([place]) 01/06/2013   Shortness of breath 12/12/2012   Swollen testicle 07/13/2011   URI (upper respiratory infection) 01/25/2012    Past Surgical History: Past Surgical History:  Procedure Laterality Date   ANGIOPLASTY     CHOLECYSTECTOMY     DIALYSIS FISTULA CREATION     EVALUATION UNDER ANESTHESIA WITH HEMORRHOIDECTOMY N/A 03/15/2020   Procedure: CONTROL OF ANAL BLEEDING;  Surgeon: Ileana Roup, MD;  Location: [facility];  Service: General;  Laterality: N/A;   KIDNEY TRANSPLANT  10/17/14   KNEE SURGERY     LEFT HEART CATHETERIZATION WITH CORONARY ANGIOGRAM N/A 10/01/2013   Procedure: LEFT HEART CATHETERIZATION WITH CORONARY ANGIOGRAM;  Surgeon: [name], MD;  Location: [facility] CATH LAB;  Service: Cardiovascular;  Laterality: N/A;   MOUTH SURGERY     NEPHRECTOMY TRANSPLANTED ORGAN  Medications Prior to Admission: Prior to Admission medications   Medication Sig Start Date End Date Taking? Authorizing Provider  amLODipine (NORVASC) 2.5 MG tablet Take 1 tablet (2.5 mg total) by mouth daily. 06/27/22   Geralynn Rile, MD  amphetamine-dextroamphetamine (ADDERALL XR) 20 MG 24 hr capsule Take 20 mg by mouth daily.    [provider]  amphetamine-dextroamphetamine (ADDERALL) 10 MG tablet Take 10  mg by mouth daily.  05/17/20   [provider]  ARIPiprazole (ABILIFY) 20 MG tablet Take 20 mg by mouth daily.    [provider]  calcium carbonate (OS-CAL) 1250 (500 Ca) MG chewable tablet Chew 1 tablet by mouth as needed.    [provider]  carvedilol (COREG) 25 MG tablet Take 1 tablet (25 mg total) by mouth 2 times daily with meals. 06/08/22   O'NealCassie Freer, MD  clonazePAM (KLONOPIN) 1 MG tablet Take 0.3 mg by mouth daily.     [provider]  HYDROcodone-acetaminophen (NORCO) 7.5-325 MG tablet Take by mouth. 04/07/21   [provider]  lidocaine-prilocaine (EMLA) cream Apply topically. 05/01/20   [provider]  silver sulfADIAZINE (SILVADENE) 1 % cream Apply 1 application topically in the morning and at bedtime. To open sores. 07/16/19   [provider]     Allergies:    Allergies  Allergen Reactions   Delacort [Hydrocortisone Base] Nausea Only   Benicar [Olmesartan]     Unknown   Food Itching    bananas   Hydralazine     Head ache   Nitrates, Organic Other (See Comments)    Palpitations, headache    Social History:   Social History   Socioeconomic History   Marital status: Single    Spouse name: Not on file   Number of children: 0   Years of education: 20+   Highest education level: Not on file  Occupational History   Occupation: Chef- fulltime  Tobacco Use   Smoking status: Former    Types: Cigars   Smokeless tobacco: Never  Scientific laboratory technician Use: Never used  Substance and Sexual Activity   Alcohol use: No    Alcohol/week: 1.0 standard drink of alcohol    Types: 1 Standard drinks or equivalent per week    Comment: Stopped drinking after Kidney transplant in December 2015   Drug use: No   Sexual activity: Yes  Other Topics Concern   Not on file  Social History Narrative   Marital status: single; dating      Children; none      Lives: alone      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      Tobacco: none      Alcohol: none      Drugs: none      Exercise: cycling; 3 days per week.      Sexual activity: females only; no male encounters; Chlamydia age 61.  Total partner = 55.  Condoms.      Caffeine use: Soda sometimes   Social Determinants of Radio broadcast assistant Strain: Not on file  Food Insecurity: Not on file  Transportation Needs: Not on file  Physical Activity: Not on file  Stress: Not on file  Social Connections: Not on file  Intimate Partner Violence: Not on file     Family History:   The patient's family history includes Arthritis in his father and mother; Depression in his  father and mother; Diabetes in his father and mother; Hypertension in his father and mother.    ROS:  All other ROS reviewed and negative. Pertinent positives noted in the HPI.     Physical Exam/Data:  There were no vitals filed for this visit. No intake or output data in the 24 hours ending 09/05/22 0916     07/26/2022    9:40 AM 06/09/2022    8:22 AM 04/16/2021   10:25 AM  Last 3 Weights  Weight (lbs) 216 lb 9.6 oz 214 lb 206 lb  Weight (kg) 98.249 kg 97.07 kg 93.441 kg    There is no height or weight on file to calculate BMI.  General: Well nourished, well developed, in no acute distress Head: Atraumatic, normal size  Eyes: PEERLA, EOMI  Neck: Supple, no JVD Endocrine: No thryomegaly Cardiac: Normal S1, S2; RRR; 3 out of 6 systolic ejection murmur, late peaking Lungs: Clear to auscultation bilaterally, no wheezing, rhonchi or rales  Abd: Soft, nontender, no hepatomegaly  Ext: No edema, pulses 2+ Musculoskeletal: No deformities, BUE and BLE strength normal and equal Skin: Warm and dry, no rashes   Neuro: Alert and oriented to person, place, time, and situation, CNII-XII grossly intact, no focal deficits  Psych: Normal mood and affect   Relevant CV Studies: TEE 07/07/2022  1. Left ventricular ejection fraction, by  estimation, is 45 to 50%. The  left ventricle has mildly decreased function. The left ventricle  demonstrates global hypokinesis. The left ventricular internal cavity size  was moderately dilated. There is moderate   left ventricular hypertrophy. Left ventricular diastolic parameters are  consistent with Grade II diastolic dysfunction (pseudonormalization).  Elevated left ventricular end-diastolic pressure. There is the  interventricular septum is flattened in systole  and diastole, consistent with right ventricular pressure and volume  overload.   2. Right ventricular systolic function is mildly reduced. The right  ventricular size is severely enlarged. There is mildly elevated pulmonary  artery systolic pressure. The estimated right ventricular systolic  pressure is 23.7 mmHg.   3. Left atrial size was moderately dilated.   4. Right atrial size was severely dilated.   5. The mitral valve is degenerative. Moderate to severe mitral valve  regurgitation.   6. The tricuspid valve is abnormal. Tricuspid valve regurgitation is  severe.   7. The aortic valve is tricuspid. There is moderate calcification of the  aortic valve. There is mild thickening of the aortic valve. Aortic valve  regurgitation is trivial. Mild aortic valve stenosis. Aortic regurgitation  PHT measures 578 msec. Aortic  valve area, by VTI measures 2.19 cm. Aortic valve mean gradient measures  11.0 mmHg. Aortic valve Vmax measures 2.27 m/s. DI is 0.49, peak gradient  20.6 mmHg.   8. Aortic dilatation noted. There is mild dilatation of the aortic root,  measuring 41 mm. There is borderline dilatation of the ascending aorta,  measuring 39 mm.   9. The inferior vena cava is dilated in size with <50% respiratory  variability, suggesting right atrial pressure of 15 mmHg.  10. Evidence of atrial level shunting detected by color flow Doppler.  There is a moderately sized patent foramen ovale with predominantly left  to right  shunting across the atrial septum.   Laboratory Data: High Sensitivity Troponin:  No results for input(s): "TROPONINIHS" in the last 720 hours.    Cardiac EnzymesNo results for input(s): "TROPONINI" in the last 168 hours. No results for input(s): "TROPIPOC" in the last 168 hours.  ChemistryNo results for input(s): "NA", "K", "CL", "CO2", "GLUCOSE", "BUN", "CREATININE", "CALCIUM", "GFRNONAA", "GFRAA", "ANIONGAP" in the last 168 hours.  No results for input(s): "PROT", "ALBUMIN", "AST", "ALT", "ALKPHOS", "BILITOT" in the last 168 hours. HematologyNo results for input(s): "WBC", "RBC", "HGB", "HCT", "MCV", "MCH", "MCHC", "RDW", "PLT" in the last 168 hours. BNPNo results for input(s): "BNP", "PROBNP" in the last 168 hours.  DDimer No results for input(s): "DDIMER" in the last 168 hours.  Radiology/Studies:  No results found.  Assessment and Plan:   #Aortic stenosis #Severe tricuspid regurgitation #Severe mitral valve regurgitation -N.p.o. for transesophageal echo for evaluation multivalve the heart disease.  Denies any symptoms today.  No difficulty swallowing.  No loose teeth.  Allergies reviewed.  For questions or updates, please contact [place] Please consult [email or web address] for contact info under   Signed, [name], MD, [place]  09/05/2022 9:16 AM

## 2022-09-05 NOTE — Anesthesia Preprocedure Evaluation (Addendum)
Anesthesia Evaluation  Patient identified by MRN, date of birth, ID band Patient awake    Reviewed: Allergy & Precautions, NPO status , Patient's Chart, lab work & pertinent test results  Airway Mallampati: II       Dental no notable dental hx.    Pulmonary former smoker   Pulmonary exam normal        Cardiovascular hypertension, Pt. on medications +CHF  + Valvular Problems/Murmurs (TR) AS and MR  Rhythm:Regular Rate:Normal + Systolic murmurs    Neuro/Psych   Anxiety Depression Bipolar Disorder      GI/Hepatic negative GI ROS, Neg liver ROS,,,  Endo/Other  negative endocrine ROS    Renal/GU ESRF and DialysisRenal disease  negative genitourinary   Musculoskeletal  (+) Arthritis , Osteoarthritis,    Abdominal Normal abdominal exam  (+)   Peds  (+) ADHD Hematology  (+) Blood dyscrasia, anemia   Anesthesia Other Findings   Reproductive/Obstetrics                             Anesthesia Physical Anesthesia Plan  ASA: 3  Anesthesia Plan: MAC   Post-op Pain Management:    Induction:   PONV Risk Score and Plan: 1 and Propofol infusion and Treatment may vary due to age or medical condition  Airway Management Planned: Simple Face Mask, Natural Airway and Nasal Cannula  Additional Equipment: None  Intra-op Plan:   Post-operative Plan:   Informed Consent: I have reviewed the patients History and Physical, chart, labs and discussed the procedure including the risks, benefits and alternatives for the proposed anesthesia with the patient or authorized representative who has indicated his/her understanding and acceptance.     Dental advisory given  Plan Discussed with:   Anesthesia Plan Comments:        Anesthesia Quick Evaluation

## 2022-09-05 NOTE — Progress Notes (Signed)
  Echocardiogram Echocardiogram Transesophageal has been performed.  [name] 09/05/2022, 11:18 AM

## 2022-09-05 NOTE — Anesthesia Postprocedure Evaluation (Signed)
Anesthesia Post Note  Patient: [name]  Procedure(s) Performed: TRANSESOPHAGEAL ECHOCARDIOGRAM (TEE) BUBBLE STUDY     Patient location during evaluation: PACU Anesthesia Type: General Level of consciousness: awake and alert Pain management: pain level controlled Vital Signs Assessment: post-procedure vital signs reviewed and stable Respiratory status: spontaneous breathing, nonlabored ventilation, respiratory function stable and patient connected to nasal cannula oxygen Cardiovascular status: blood pressure returned to baseline and stable Postop Assessment: no apparent nausea or vomiting Anesthetic complications: no   No notable events documented.  Last Vitals:  Vitals:   09/05/22 1100 09/05/22 1115  BP: 120/83 126/71  Pulse: 66 63  Resp: 17 20  Temp:  36.5 C  SpO2: 97% 96%    Last Pain:  Vitals:   09/05/22 1115  TempSrc:   PainSc: 0-No pain                 Belenda Cruise P Sahaana Weitman

## 2022-09-07 ENCOUNTER — Encounter (HOSPITAL_COMMUNITY): Payer: Self-pay | Admitting: Cardiovascular Disease

## 2022-09-07 ENCOUNTER — Telehealth: Payer: Self-pay

## 2022-09-07 NOTE — Telephone Encounter (Signed)
-----   Message from Geralynn Rile, MD sent at 09/05/2022  2:09 PM EDT ----- Regarding: Follow-up Almyra Free:  Needs to see me in the next 2-3 weeks. -W

## 2022-09-07 NOTE — Telephone Encounter (Signed)
Attempted to contact patient to get scheduled.  Unable to reach patient.  Will try again.

## 2022-09-09 NOTE — Telephone Encounter (Signed)
Called patient, got scheduled.   He is aware to come in on 11/17 at 8:00 AM.  Patient verbalized understanding

## 2022-09-20 NOTE — Progress Notes (Signed)
Cardiology Office Note:   Date:  09/23/2022  NAME:  [name]    MRN: [id number] DOB:  Mar 07, 1971   PCP:  Joesph July, MD  Cardiologist:  None  Electrophysiologist:  None   Referring MD: Joesph July, MD   Chief Complaint  Patient presents with   Follow-up   History of Present Illness:   [name] is a 51 y.o. male with a hx of ESRD, moderate AS, pHTN, severe MR/TR who presents for follow-up.  Transesophageal echo confirms a flail segment of the mitral valve leaflet.  He has severe mitral valve regurgitation.  He also has severe tricuspid regurgitation with severe pulmonary hypertension.  It is unclear how much his pulmonary hypertension is from his mitral valve disorder versus untreated sleep apnea.  He apparently wears his oxygen at night but does not wear his CPAP machine.  He has done this for years.  He has been seen by his pulmonologist earlier this year with stable assessment.  We discussed reevaluation of this.  We also discussed left right heart catheterization to further sort out how much his mitral valve is contributing to his change in right heart function.  This will help [place] sort this out.  He is okay to proceed with this.  Reports low energy.  Denies any chest pain or trouble breathing today in office.  His hemoglobin value is 11.2.  He gets this checked weekly with dialysis.  Problem List  1. 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 HTN 3. Perianal condyloma  4. Moderate aortic stenosis -V max 3.1 m/s, MG 21 mmHG, AVA 1.5 cm2 05/2020 -Vmax 3.0 m/s, MG 20 mmHG 5. Chronic anemia  6. HLD -T chol 67, HDL 28, LDL 24, TG 83 7. Systolic HF, 88-28% 8. Severe pulmonary hypertension  9. Severe mitral valve regurgitation  -flail A1 segment 10. Severe tricuspid regurgitation  11. OSA -only wears O2 at night/cannot tolerate CPAP  Past Medical History: Past Medical History:  Diagnosis Date   ADHD  (attention deficit hyperactivity disorder)    Anemia    Anemia, chronic disease 07/13/2011   Anxiety    Arthritis    Bipolar 2 disorder ([place]) 08/18/2011   Diagnosed in 2011.    Bipolar disorder ([place])    Blood transfusion    Blood transfusion without reported diagnosis    Chronic diastolic CHF (congestive heart failure) (HCC)    Complication of anesthesia    Woke up intubated gets combative  afraid to be alone    Congestive heart failure (CHF) ([place]) 07/13/2011   Onset 2006.  Followed by Dr. Johnsie Cancel.  S/p cardiac catheterization in [facility] Dr. Marlou Porch..  S/p cardiac catheterization in 2014 by Nishan.  Echo 2014.    Depression    Erectile dysfunction    ESRD on hemodialysis ([place]) 07/13/2011   Glomerulonephritis at age 53, started HD in 51.  Deceased donor renal transplant 1999 at [facility], then kidney failed and went back on HD in 2006.  L forearm AVF . s/p repeat renal transplant 10/2014 [facility].   GI bleed 11/01/2011   Rectal bleeding with emesis/diarrhea    Hemodialysis status ([place])    Hydrocele, right 11/25/2014   Hypertension    Hypertensive emergency 01/06/2013   Hypoglycemia 01/11/2013   Influenza-like illness 12/18/2013   Mild ascending aorta dilatation (HCC)    NICM (nonischemic cardiomyopathy) (HCC)    Pericardial effusion    a. Mod by echo in 2014,  similar to prior.   Peripheral polyneuropathy 08/04/2016   Pulmonary edema 01/06/2013   Pulmonary hypertension associated with end stage renal disease on dialysis [facility]) 07/13/2011   Renal transplant, status post 11/25/2014   Pt with Glomerulonephritis at age 6. Deceased donor renal transplant 1999 right and left renal transplant 10/2014.  Followed by [facility] transplant team; nephrologist is Dr. Barbette Merino.    Respiratory failure with hypoxia ([place]) 01/06/2013   Shortness of breath 12/12/2012   Swollen testicle 07/13/2011   URI (upper respiratory infection) 01/25/2012    Past Surgical History: Past Surgical History:  Procedure Laterality Date    ANGIOPLASTY     BUBBLE STUDY  09/05/2022   Procedure: BUBBLE STUDY;  Surgeon: Geralynn Rile, MD;  Location: [place];  Service: Cardiovascular;;   CHOLECYSTECTOMY     DIALYSIS FISTULA CREATION     EVALUATION UNDER ANESTHESIA WITH HEMORRHOIDECTOMY N/A 03/15/2020   Procedure: CONTROL OF ANAL BLEEDING;  Surgeon: Ileana Roup, MD;  Location: [place];  Service: General;  Laterality: N/A;   KIDNEY TRANSPLANT  10/17/14   KNEE SURGERY     LEFT HEART CATHETERIZATION WITH CORONARY ANGIOGRAM N/A 10/01/2013   Procedure: LEFT HEART CATHETERIZATION WITH CORONARY ANGIOGRAM;  Surgeon: [name], MD;  Location: [facility] CATH LAB;  Service: Cardiovascular;  Laterality: N/A;   MOUTH SURGERY     NEPHRECTOMY TRANSPLANTED ORGAN     TEE WITHOUT CARDIOVERSION N/A 09/05/2022   Procedure: TRANSESOPHAGEAL ECHOCARDIOGRAM (TEE);  Surgeon: Geralynn Rile, MD;  Location: [place];  Service: Cardiovascular;  Laterality: N/A;    Current Medications: Current Meds  Medication Sig   amLODipine (NORVASC) 2.5 MG tablet Take 1 tablet (2.5 mg total) by mouth daily.   amphetamine-dextroamphetamine (ADDERALL XR) 20 MG 24 hr capsule Take 20 mg by mouth daily.   amphetamine-dextroamphetamine (ADDERALL) 10 MG tablet Take 10 mg by mouth daily.    ARIPiprazole (ABILIFY) 20 MG tablet Take 20 mg by mouth daily.   carvedilol (COREG) 25 MG tablet Take 1 tablet (25 mg total) by mouth 2 times daily with meals.   clonazePAM (KLONOPIN) 1 MG tablet Take 0.3 mg by mouth daily.    HYDROcodone-acetaminophen (NORCO) 7.5-325 MG tablet Take by mouth.   lidocaine-prilocaine (EMLA) cream Apply topically.   silver sulfADIAZINE (SILVADENE) 1 % cream Apply 1 application topically in the morning and at bedtime. To open sores.   [DISCONTINUED] calcium carbonate (OS-CAL) 1250 (500 Ca) MG chewable tablet Chew 1 tablet by mouth as needed.     Allergies:    Delacort [hydrocortisone base]; Benicar [olmesartan]; Food;  Hydralazine; and Nitrates, organic   Social History: Social History   Socioeconomic History   Marital status: Single    Spouse name: Not on file   Number of children: 0   Years of education: 20+   Highest education level: Not on file  Occupational History   Occupation: Chef- fulltime  Tobacco Use   Smoking status: Former    Types: Cigars   Smokeless tobacco: Never  Scientific laboratory technician Use: Never used  Substance and Sexual Activity   Alcohol use: No    Alcohol/week: 1.0 standard drink of alcohol    Types: 1 Standard drinks or equivalent per week    Comment: Stopped drinking after Kidney transplant in December 2015   Drug use: No   Sexual activity: Yes  Other Topics Concern   Not on file  Social History Narrative   Marital status: single; dating  Children; none      Lives: alone      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      Tobacco: none      Alcohol: none      Drugs: none      Exercise: cycling; 3 days per week.      Sexual activity: females only; no male encounters; Chlamydia age 38.  Total partner = 55.  Condoms.      Caffeine use: Soda sometimes   Social Determinants of Radio broadcast assistant Strain: Not on file  Food Insecurity: Not on file  Transportation Needs: Not on file  Physical Activity: Not on file  Stress: Not on file  Social Connections: Not on file     Family History: The patient's family history includes Arthritis in his father and mother; Depression in his father and mother; Diabetes in his father and mother; Hypertension in his father and mother.  ROS:   All other ROS reviewed and negative. Pertinent positives noted in the HPI.     EKGs/Labs/Other Studies Reviewed:   The following studies were personally reviewed by me today:  EKG:  EKG is ordered today.  The ekg ordered today demonstrates normal sinus rhythm heart rate 66, first-degree AV block, LVH by voltage, and was  personally reviewed by me.   TEE 09/05/2022  1. Severe MV regurgitation due to a flail A1 segment. Posteriorly  directed jet. 2D ERO 0.26 cm2, R vol 58 cc, 3D VCA 0.50 cm2. Systolic flow  reversal in the LUPV. MG 2 mmHG @ 67 bpm. MVA 4.80 cm2. PMVL length 21 mm.  The mitral valve is degenerative.  Severe mitral valve regurgitation. No evidence of mitral stenosis. The  mean mitral valve gradient is 2.0 mmHg with average heart rate of 67 bpm.   2. Severe, torrential tricuspid regurgitation due to incomplete leaflet  coaptation. 2D ERO 0.55 cm2, R vol 47 cc. The tricuspid valve is abnormal.  Tricuspid valve regurgitation is severe.   3. The aortic valve is tricuspid. There is moderate calcification of the  aortic valve. There is moderate thickening of the aortic valve. Aortic  valve regurgitation is mild. Mild aortic valve stenosis. Aortic valve  area, by VTI measures 2.21 cm. Aortic  valve mean gradient measures 14.6 mmHg. Aortic valve Vmax measures 2.51  m/s.   4. Left ventricular ejection fraction, by estimation, is 45 to 50%. The  left ventricle has mildly decreased function.   5. Right ventricular systolic function is moderately reduced. The right  ventricular size is severely enlarged. There is mildly elevated pulmonary  artery systolic pressure. The estimated right ventricular systolic  pressure is 43.1 mmHg.   6. Left atrial size was severely dilated. No left atrial/left atrial  appendage thrombus was detected. The LAA emptying velocity was 40 cm/s.   7. Right atrial size was severely dilated.   8. A small pericardial effusion is present. The pericardial effusion is  circumferential.   9. Aortic dilatation noted. Aneurysm of the aortic root, measuring 44 mm.  10. The inferior vena cava is dilated in size with <50% respiratory  variability, suggesting right atrial pressure of 15 mmHg.  11. Agitated saline contrast bubble study was negative, with no evidence  of any interatrial  shunt.   Recent Labs: 09/05/2022: BUN 44; Creatinine, Ser 12.70; Hemoglobin 13.9; Potassium 4.9; Sodium 138   Recent Lipid Panel    Component Value Date/Time  CHOL  11/04/2007 0424    83        ATP III CLASSIFICATION:  <200     mg/dL   Desirable  200-239  mg/dL   Borderline High  >=240    mg/dL   High   TRIG 60 11/04/2007 0424   HDL 20 (L) 11/04/2007 0424   CHOLHDL 4.2 11/04/2007 0424   VLDL 12 11/04/2007 0424   LDLCALC  11/04/2007 0424    51        Total Cholesterol/HDL:CHD Risk Coronary Heart Disease Risk Table                     Men   Women  1/2 Average Risk   3.4   3.3    Physical Exam:   VS:  Pulse 67   Ht '6\' 2"'$  (1.88 m)   Wt 216 lb 3.2 oz (98.1 kg)   SpO2 100%   BMI 27.76 kg/m    Wt Readings from Last 3 Encounters:  09/23/22 216 lb 3.2 oz (98.1 kg)  09/05/22 209 lb 7 oz (95 kg)  07/26/22 216 lb 9.6 oz (98.2 kg)    General: Well nourished, well developed, in no acute distress Head: Atraumatic, normal size  Eyes: PEERLA, EOMI  Neck: Supple, no JVD Endocrine: No thryomegaly Cardiac: Normal S1, S2; RRR; 3 out of 6 holosystolic murmur, harsh Lungs: Clear to auscultation bilaterally, no wheezing, rhonchi or rales  Abd: Soft, nontender, no hepatomegaly  Ext: 1+ pitting edema Musculoskeletal: No deformities, BUE and BLE strength normal and equal Skin: Warm and dry, no rashes   Neuro: Alert and oriented to person, place, time, and situation, CNII-XII grossly intact, no focal deficits  Psych: Normal mood and affect   ASSESSMENT:   [name] is a 51 y.o. male who presents for the following: 1. Nonrheumatic aortic valve stenosis   2. Nonrheumatic mitral valve regurgitation   3. Nonrheumatic tricuspid valve regurgitation   4. Pulmonary hypertension, unspecified ([place])   5. Chronic systolic heart failure (HCC)     PLAN:   1. Nonrheumatic aortic valve stenosis -Appears to be mild to moderate based on TEE.  See discussion below mitral valve.  2. Nonrheumatic  mitral valve regurgitation -Flail A1 segment.  Has had change in pulmonary pressures.  It is a small flail segment but could be contributing.  I recommended left heart catheterization to sort out how much his mitral valve is contributing to his decreased LV function as well as change in pulmonary pressures.  He also reports untreated sleep apnea but wears oxygen at night.  Okay to proceed with left and right heart catheterization.  We will obtain a BMP at dialysis.  Recent CBC is normal with a hemoglobin 11.2.  3. Nonrheumatic tricuspid valve regurgitation 4. Pulmonary hypertension, unspecified ([place]) -Worsening right heart function with severe tricuspid regurgitation.  He also has severe pulm hypertension.  On review of echoes he had worsening RV function and worsening pulmonary pressures over the past few years.  He tells me he has sleep apnea but cannot wear the machine.  He only wears nocturnal oxygen.  We discussed left and right heart catheterization to sort out how much his mitral valve is contributing to this picture.  He is okay to proceed with this.  Risk and benefits explained.  I have also asked him to reach back out to his pulmonologist to ensure his sleep apnea is treated well.  He may have pulmonary hypertension on proportion to  his mitral valve in which we would not treat his mitral valve with mitral valve clip.  We will discuss further after left heart catheterization.  5. Chronic systolic heart failure (HCC) -EF 45-50%.  Suffers from chronic hypotension.  On dialysis.  On carvedilol.  We will continue this.  Volume status is stable.   Shared Decision Making/Informed Consent The risks [stroke (1 in 1000), death (1 in 1000), kidney failure [usually temporary] (1 in 500), bleeding (1 in 200), allergic reaction [possibly serious] (1 in 200)], benefits (diagnostic support and management of coronary artery disease) and alternatives of a cardiac catheterization were discussed in detail with Mr.  Sloane and he is willing to proceed.  Disposition: Return in about 6 months (around 03/24/2023).  Medication Adjustments/Labs and Tests Ordered: Current medicines are reviewed at length with the patient today.  Concerns regarding medicines are outlined above.  No orders of the defined types were placed in this encounter.  No orders of the defined types were placed in this encounter.   There are no Patient Instructions on file for this visit.   Time Spent with Patient: I have spent a total of 35 minutes with patient reviewing hospital notes, telemetry, EKGs, labs and examining the patient as well as establishing an assessment and plan that was discussed with the patient.  > 50% of time was spent in direct patient care.  Signed, Addison Naegeli. Audie Box, MD, [facility]  [address] [place], [place] [address] [phone]  09/23/2022 8:24 AM

## 2022-09-23 ENCOUNTER — Encounter: Payer: Self-pay | Admitting: Cardiovascular Disease

## 2022-09-23 ENCOUNTER — Ambulatory Visit: Payer: Medicare HMO | Attending: Cardiovascular Disease | Admitting: Cardiovascular Disease

## 2022-09-23 VITALS — HR 67 | Ht 74.0 in | Wt 216.2 lb

## 2022-09-23 DIAGNOSIS — I361 Nonrheumatic tricuspid (valve) insufficiency: Secondary | ICD-10-CM | POA: Diagnosis not present

## 2022-09-23 DIAGNOSIS — I350 Nonrheumatic aortic (valve) stenosis: Secondary | ICD-10-CM

## 2022-09-23 DIAGNOSIS — I5022 Chronic systolic (congestive) heart failure: Secondary | ICD-10-CM

## 2022-09-23 DIAGNOSIS — I272 Pulmonary hypertension, unspecified: Secondary | ICD-10-CM

## 2022-09-23 DIAGNOSIS — I340 Nonrheumatic mitral (valve) insufficiency: Secondary | ICD-10-CM | POA: Diagnosis not present

## 2022-09-23 NOTE — Patient Instructions (Signed)
Medication Instructions:  The current medical regimen is effective;  continue present plan and medications.  *If you need a refill on your cardiac medications before your next appointment, please call your pharmacy*   Lab Work: CBC, BMET (you can have this completed at dialysis)   If you have labs (blood work) drawn today and your tests are completely normal, you will receive your results only by: [place] (if you have MyChart) OR A paper copy in the mail If you have any lab test that is abnormal or we need to change your treatment, we will call you to review the results.   Testing/Procedures: Your physician has requested that you have a cardiac catheterization. Cardiac catheterization is used to diagnose and/or treat various heart conditions. Doctors may recommend this procedure for a number of different reasons. The most common reason is to evaluate chest pain. Chest pain can be a symptom of coronary artery disease (CAD), and cardiac catheterization can show whether plaque is narrowing or blocking your heart's arteries. This procedure is also used to evaluate the valves, as well as measure the blood flow and oxygen levels in different parts of your heart. For further information please visit [email or web address]. Please follow instruction sheet, as given.    Follow-Up: At [facility], you and your health needs are our priority.  As part of our continuing mission to provide you with exceptional heart care, we have created designated Provider Care Teams.  These Care Teams include your primary Cardiologist (physician) and Advanced Practice Providers (APPs -  Physician Assistants and Nurse Practitioners) who all work together to provide you with the care you need, when you need it.  We recommend signing up for the patient portal called "MyChart".  Sign up information is provided on this After Visit Summary.  MyChart is used to connect with patients for Virtual Visits  (Telemedicine).  Patients are able to view lab/test results, encounter notes, upcoming appointments, etc.  Non-urgent messages can be sent to your provider as well.   To learn more about what you can do with MyChart, go to [email or web address].    Your next appointment:   3 month(s)  The format for your next appointment:   In Person  Provider:   Eleonore Chiquito, MD          Cardiac/Peripheral Catheterization   You are scheduled for a Cardiac Catheterization on Wednesday, November 29 with Dr. Sherren Mocha.  1. Please arrive at the Main Entrance A at [facility]: [place], [place] [address] on November 29 at 9:30 AM (This time is two hours before your procedure to ensure your preparation). Free valet parking service is available. You will check in at ADMITTING. The support person will be asked to wait in the waiting room.  It is OK to have someone drop you off and come back when you are ready to be discharged.        Special note: Every effort is made to have your procedure done on time. Please understand that emergencies sometimes delay scheduled procedures.   . 2. Diet: Do not eat solid foods after midnight.  You may have clear liquids until 5 AM the day of the procedure.  3. Labs: You will need to have blood drawn (CBC, BMET) at dialysis. (Care everywhere will have results). You do not need to be fasting.  4. Medication instructions in preparation for your procedure:   Contrast Allergy: No   On the morning of your procedure  you may take any morning medicines NOT listed above.  You may use sips of water.  5. Plan to go home the same day, you will only stay overnight if medically necessary. 6. You MUST have a responsible adult to drive you home. 7. An adult MUST be with you the first 24 hours after you arrive home. 8. Bring a current list of your medications, and the last time and date medication taken. 9. Bring ID and current insurance  cards. 10.Please wear clothes that are easy to get on and off and wear slip-on shoes.  Thank you for allowing [place] to care for you!   --  Invasive Cardiovascular services

## 2022-10-04 ENCOUNTER — Encounter: Payer: Self-pay | Admitting: Cardiovascular Disease

## 2022-10-04 ENCOUNTER — Telehealth: Payer: Self-pay | Admitting: Cardiovascular Disease

## 2022-10-04 ENCOUNTER — Telehealth: Payer: Self-pay | Admitting: *Deleted

## 2022-10-04 NOTE — Telephone Encounter (Signed)
Patient would like to know what time he needs to arrive at the hospital for 11/29 procedure with Dr. Burt Knack. Please advise.

## 2022-10-04 NOTE — Telephone Encounter (Addendum)
After discussion with Dr Audie Box and patient, cardiac cath has been cancelled for 10/05/22, will reschedule for later date-will need office visit if cath not rescheduled within 30 days of 09/23/22 office visit with Dr Audie Box.

## 2022-10-04 NOTE — Telephone Encounter (Addendum)
Cardiac Catheterization scheduled at [facility] for: Wednesday October 05, 2022 11:30 AM Arrival time and place: [facility] Main Entrance A at: 9:00  AM-needs BMP/CBC.  Nothing to eat after midnight prior to procedure, clear liquids until 5 AM day of procedure.  Medication instructions: -Usual morning medications can be taken with sips of water including aspirin 81 mg.  Confirmed patient has responsible adult to drive home post procedure and be with patient first 24 hours after arriving home *

## 2022-10-04 NOTE — Telephone Encounter (Addendum)
Patient reports that his plan was to have cardiac cath 10/05/22 then go to dialysis at 3:30 PM. Patient reports that he was not aware that he would need someone to be with him the first 24 hours home if same day discharge-he would have to make arrangements for this and is not sure he can do that by tomorrow. Patient is willing to stay overnight at the hospital tomorrow night, but does not want to have dialysis at the hospital.   Patient has not had lab recently at dialysis and is aware he will need BMP/CBC on arrival to Short Stay tomorrow.  Patient reports IVs are usually started by anesthesia in a foot since neither his right or left arm can be used for IV access.  Patient requests a call from Dr Audie Box to discuss plans for tomorrow.

## 2022-10-05 ENCOUNTER — Encounter (HOSPITAL_COMMUNITY): Admission: RE | Payer: Self-pay | Source: Home / Self Care

## 2022-10-05 ENCOUNTER — Ambulatory Visit (HOSPITAL_COMMUNITY): Admission: RE | Admit: 2022-10-05 | Payer: Medicare HMO | Source: Home / Self Care | Admitting: Cardiovascular Disease

## 2022-10-05 SURGERY — RIGHT/LEFT HEART CATH AND CORONARY ANGIOGRAPHY
Anesthesia: LOCAL

## 2022-10-05 NOTE — Telephone Encounter (Signed)
See Procedure Instruction Phone Note 10/04/22.

## 2022-10-06 NOTE — Telephone Encounter (Signed)
Sent mychart message- to get his schedule of dialysis, gave a few days of when Dr.Cooper is doing caths to see if these days would work.   Thanks!

## 2022-11-01 ENCOUNTER — Telehealth: Payer: Self-pay | Admitting: Cardiovascular Disease

## 2022-11-01 NOTE — Telephone Encounter (Signed)
  Pt is calling, he said, he is scheduled on Thursday 11/03/22 for heart cath. He said, he is calling to get information. He said, for some reason he can't do it this Thursday let him know so the person who's driving him can go to work on Thursday

## 2022-11-02 NOTE — Telephone Encounter (Signed)
Contacted patient, advised that he did not have anything scheduled at this time for tomorrow.   I did advise we needed to get him scheduled for the L/R heart cath. I did speak with Dr.O'Neal who recommended that we use Dr.McAlhany or Dr.Cooper. I advised first opening on his non-dialysis day was January 18th. Verified date with patient, verified time (arrival at 11:30 AM- start time 1:30 PM) aware his ride will need to bring him, stay, and drive him home- also monitor once he is at home. Patient verbalized understanding.   He is aware he needs at least a BMET, CBC- he states he normally gets his blood work by kidney provider (results in epic) advised what was needed and should be okay for procedure depending on when they draw them. Patient verbalized understanding.  Instructions sent via mychart- request of patient.

## 2022-11-02 NOTE — Telephone Encounter (Signed)
Rescheduled for cath- see another telephone note from 12/27.  Thanks!

## 2022-11-02 NOTE — Telephone Encounter (Signed)
Pt is returning call in regards to a procedure. Requesting return call.

## 2022-11-21 ENCOUNTER — Telehealth: Payer: Self-pay

## 2022-11-21 NOTE — Progress Notes (Unsigned)
Cardiology Office Note:    Date:  11/22/2022   ID:  Eugenia Pancoast, DOB [birth year], MRN [id number]  PCP:  Joesph July, MD  Cardiologist:  None  Electrophysiologist:  None   Referring MD: Joesph July, MD   Chief Complaint  Patient presents with   Cardiac Valve Problem    History of Present Illness:    [name] is a 52 y.o. male with a hx of ESRD, pulmonary hypertension, moderate aortic stenosis, severe mitral regurgitation/tricuspid regurgitation who presents for follow-up.  He follows with Dr. Audie Box.  Underwent TEE on 09/05/2022, which showed severe mitral regurgitation due to flail A1 segment, torrential tricuspid regurgitation, mild aortic stenosis, EF 45 to 50%.  Saw Dr. Audie Box on 09/23/2022, RHC/LHC ordered to further workup valvular disease, scheduled for 11/24/2022.    Denies any chest pain but reports having dyspnea on exertion.  States that walking 50 feet at a brisk pace will feel short of breath.  He started stationary bike but not recently.  Reports has had issues with blood pressure being low during dialysis but not recently, states has been normotensive during dialysis.  Reports he makes minimal urine.  He has started back on CPAP and only using 2 to 3 days/week   Past Medical History:  Diagnosis Date   ADHD (attention deficit hyperactivity disorder)    Anemia    Anemia, chronic disease 07/13/2011   Anxiety    Arthritis    Bipolar 2 disorder ([place]) 08/18/2011   Diagnosed in 2011.    Bipolar disorder ([place])    Blood transfusion    Blood transfusion without reported diagnosis    Chronic diastolic CHF (congestive heart failure) (HCC)    Complication of anesthesia    Woke up intubated gets combative  afraid to be alone    Congestive heart failure (CHF) ([place]) 07/13/2011   Onset 2006.  Followed by Dr. Johnsie Cancel.  S/p cardiac catheterization in [facility] Dr. Marlou Porch..  S/p cardiac catheterization in 2014 by [facility].  Echo 2014.    Depression    Erectile dysfunction     ESRD on hemodialysis ([place]) 07/13/2011   Glomerulonephritis at age 66, started HD in 44.  Deceased donor renal transplant 1999 at [facility], then kidney failed and went back on HD in 2006.  L forearm AVF . s/p repeat renal transplant 10/2014 [facility].   GI bleed 11/01/2011   Rectal bleeding with emesis/diarrhea    Hemodialysis status ([place])    Hydrocele, right 11/25/2014   Hypertension    Hypertensive emergency 01/06/2013   Hypoglycemia 01/11/2013   Influenza-like illness 12/18/2013   Mild ascending aorta dilatation (HCC)    NICM (nonischemic cardiomyopathy) (HCC)    Pericardial effusion    a. Mod by echo in 2014, similar to prior.   Peripheral polyneuropathy 08/04/2016   Pulmonary edema 01/06/2013   Pulmonary hypertension associated with end stage renal disease on dialysis [facility]) 07/13/2011   Renal transplant, status post 11/25/2014   Pt with Glomerulonephritis at age 55. Deceased donor renal transplant 1999 right and left renal transplant 10/2014.  Followed by [facility] transplant team; nephrologist is Dr. Barbette Merino.    Respiratory failure with hypoxia ([place]) 01/06/2013   Shortness of breath 12/12/2012   Swollen testicle 07/13/2011   URI (upper respiratory infection) 01/25/2012    Past Surgical History:  Procedure Laterality Date   ANGIOPLASTY     BUBBLE STUDY  09/05/2022   Procedure: BUBBLE STUDY;  Surgeon: Geralynn Rile, MD;  Location: [place];  Service:  Cardiovascular;;   CHOLECYSTECTOMY     DIALYSIS FISTULA CREATION     EVALUATION UNDER ANESTHESIA WITH HEMORRHOIDECTOMY N/A 03/15/2020   Procedure: CONTROL OF ANAL BLEEDING;  Surgeon: Ileana Roup, MD;  Location: [place];  Service: General;  Laterality: N/A;   KIDNEY TRANSPLANT  10/17/14   KNEE SURGERY     LEFT HEART CATHETERIZATION WITH CORONARY ANGIOGRAM N/A 10/01/2013   Procedure: LEFT HEART CATHETERIZATION WITH CORONARY ANGIOGRAM;  Surgeon: [name], MD;  Location: [facility] C[facility] LAB;  Service: Cardiovascular;  Laterality: N/A;   MOUTH  SURGERY     NEPHRECTOMY TRANSPLANTED ORGAN     TEE WITHOUT CARDIOVERSION N/A 09/05/2022   Procedure: TRANSESOPHAGEAL ECHOCARDIOGRAM (TEE);  Surgeon: Geralynn Rile, MD;  Location: [place];  Service: Cardiovascular;  Laterality: N/A;    Current Medications: Current Meds  Medication Sig   acetaminophen (TYLENOL) 500 MG tablet Take 1,000 mg by mouth daily as needed for moderate pain.   acyclovir (ZOVIRAX) 200 MG capsule Take 200 mg by mouth 2 (two) times daily.   amLODipine (NORVASC) 2.5 MG tablet Take 1 tablet (2.5 mg total) by mouth daily. (Patient taking differently: Take 2.5 mg by mouth See admin instructions. Take 2.5 mg on Tuesday and Thursday, Saturday and Sunday once a day in the morning)   amphetamine-dextroamphetamine (ADDERALL XR) 20 MG 24 hr capsule Take 20 mg by mouth every morning.   amphetamine-dextroamphetamine (ADDERALL) 10 MG tablet Take 10 mg by mouth every evening.   bismuth subsalicylate (PEPTO BISMOL) 262 MG chewable tablet Chew 524 mg by mouth as needed for indigestion.   calcium carbonate (TUMS EX) 750 MG chewable tablet Chew 1,500 mg by mouth See admin instructions. Take 1500 mg twice daily with snacks   carvedilol (COREG) 12.5 MG tablet Take 12.5 mg by mouth See admin instructions. Take 12.5 mg on Monday,Wednesday and Friday once a day in the morning   carvedilol (COREG) 25 MG tablet Take 1 tablet (25 mg total) by mouth 2 times daily with meals. (Patient taking differently: Take 25 mg by mouth See admin instructions. Take 25 mg twice a day on Tuesday,Thursday, Saturday and Sunday)   clobetasol ointment (TEMOVATE) 6.57 % Apply 1 Application topically 2 (two) times daily as needed (rash).   clonazePAM (KLONOPIN) 0.5 MG tablet Take 1 mg by mouth See admin instructions. Take 1 mg Monday, Wednesday and Friday in the evening   HYDROcodone-acetaminophen (NORCO) 7.5-325 MG tablet Take 1 tablet by mouth 3 (three) times daily as needed for severe pain or moderate pain.    lidocaine-prilocaine (EMLA) cream Apply 1 Application topically every Monday, Wednesday, and Friday. One hour prior to Dialysis   sevelamer carbonate (RENVELA) 0.8 g PACK packet Take 0.8-1.6 g by mouth 3 (three) times daily with meals.     Allergies:   Delacort [hydrocortisone base]; Benicar [olmesartan]; Diltiazem; Food; Hydralazine; Nitrates, organic; and Psyllium   Social History   Socioeconomic History   Marital status: Single    Spouse name: Not on file   Number of children: 0   Years of education: 20+   Highest education level: Not on file  Occupational History   Occupation: Chef- fulltime  Tobacco Use   Smoking status: Former    Types: Cigars   Smokeless tobacco: Never  Scientific laboratory technician Use: Never used  Substance and Sexual Activity   Alcohol use: No    Alcohol/week: 1.0 standard drink of alcohol    Types: 1 Standard drinks or equivalent per week  Comment: Stopped drinking after Kidney transplant in December 2015   Drug use: No   Sexual activity: Yes  Other Topics Concern   Not on file  Social History Narrative   Marital status: single; dating      Children; none      Lives: alone      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      Tobacco: none      Alcohol: none      Drugs: none      Exercise: cycling; 3 days per week.      Sexual activity: females only; no male encounters; Chlamydia age 29.  Total partner = 55.  Condoms.      Caffeine use: Soda sometimes   Social Determinants of Radio broadcast assistant Strain: Not on file  Food Insecurity: Not on file  Transportation Needs: Not on file  Physical Activity: Not on file  Stress: Not on file  Social Connections: Not on file     Family History: The patient's family history includes Arthritis in his father and mother; Depression in his father and mother; Diabetes in his father and mother; Hypertension in his father and mother.  ROS:   Please see  the history of present illness.     All other systems reviewed and are negative.  EKGs/Labs/Other Studies Reviewed:    The following studies were reviewed today:   EKG:   11/22/22: Sinus rhythm with first-degree AV block, rate 72, left axis deviation, LVH, T wave inversion in I, aVL  Recent Labs: 09/05/2022: BUN 44; Creatinine, Ser 12.70; Hemoglobin 13.9; Potassium 4.9; Sodium 138  Recent Lipid Panel    Component Value Date/Time   CHOL  11/04/2007 0424    83        ATP III CLASSIFICATION:  <200     mg/dL   Desirable  200-239  mg/dL   Borderline High  >=240    mg/dL   High   TRIG 60 11/04/2007 0424   HDL 20 (L) 11/04/2007 0424   CHOLHDL 4.2 11/04/2007 0424   VLDL 12 11/04/2007 0424   LDLCALC  11/04/2007 0424    51        Total Cholesterol/HDL:CHD Risk Coronary Heart Disease Risk Table                     Men   Women  1/2 Average Risk   3.4   3.3    Physical Exam:    VS:  BP (!) 162/104   Pulse 72   Ht '6\' 2"'$  (1.88 m)   Wt 211 lb (95.7 kg)   SpO2 99%   BMI 27.09 kg/m     Wt Readings from Last 3 Encounters:  11/22/22 211 lb (95.7 kg)  09/23/22 216 lb 3.2 oz (98.1 kg)  09/05/22 209 lb 7 oz (95 kg)     GEN:  Well nourished, well developed in no acute distress HEENT: Normal NECK: + JVD LYMPHATICS: No lymphadenopathy CARDIAC: RRR, 3/6 systolic murmur RESPIRATORY:  Clear to auscultation without rales, wheezing or rhonchi  ABDOMEN: Soft, non-tender, non-distended MUSCULOSKELETAL:  No edema; No deformity  SKIN: Warm and dry NEUROLOGIC:  Alert and oriented x 3 PSYCHIATRIC:  Normal affect   ASSESSMENT:    1. Nonrheumatic mitral valve regurgitation   2. Pulmonary hypertension, unspecified ([place])   3. Chronic systolic heart failure ([place])   4. OSA (obstructive sleep apnea)  PLAN:    Severe mitral vegetation: Flail A1 segment on TEE 08/2022.  LHC/RHC ordered for further evaluation, scheduled with Dr. Angelena Form on 11/24/2022.  Risks and benefits of cardiac  catheterization have been discussed with the patient.  These include bleeding, infection, kidney damage, stroke, heart attack, death.  The patient understands these risks and is willing to proceed.  He needs BMET/CBC drawn prior to procedure but declines to have drawn today; he is willing to have drawn in HD on 1/17.    Severe pulmonary hypertension: Noted on recent echo.  RHC planned for further evaluation as above  Chronic systolic heart failure: EF 45 to 50%.  On carvedilol.  On HD for volume management.  Appears euvolemic except for JVD on exam, which is likely due to severe TR.  BP elevated in clinic today but reports has been normotensive in HD, previously had issues with low BP during HD.  OSA: on CPAP, uses about 2-3 days per week.  Encouraged compliance.    Follow-up with Dr. Audie Box as scheduled   Medication Adjustments/Labs and Tests Ordered: Current medicines are reviewed at length with the patient today.  Concerns regarding medicines are outlined above.  Orders Placed This Encounter  Procedures   EKG 12-Lead   No orders of the defined types were placed in this encounter.   Patient Instructions  Medication Instructions:  Your physician recommends that you continue on your current medications as directed. Please refer to the Current Medication list given to you today.  *If you need a refill on your cardiac medications before your next appointment, please call your pharmacy*   Lab Work: BMET, CBC tomorrow at Dialysis  If you have labs (blood work) drawn today and your tests are completely normal, you will receive your results only by: [place] (if you have MyChart) OR A paper copy in the mail If you have any lab test that is abnormal or we need to change your treatment, we will call you to review the results.  Follow-Up: At [facility], you and your health needs are our priority.  As part of our continuing mission to provide you with exceptional heart  care, we have created designated Provider Care Teams.  These Care Teams include your primary Cardiologist (physician) and Advanced Practice Providers (APPs -  Physician Assistants and Nurse Practitioners) who all work together to provide you with the care you need, when you need it.  We recommend signing up for the patient portal called "MyChart".  Sign up information is provided on this After Visit Summary.  MyChart is used to connect with patients for Virtual Visits (Telemedicine).  Patients are able to view lab/test results, encounter notes, upcoming appointments, etc.  Non-urgent messages can be sent to your provider as well.   To learn more about what you can do with MyChart, go to [email or web address].    Your next appointment:   As scheduled with Dr. Rex Kras are scheduled for a Cardiac Catheterization on Thursday, January 18 with Dr. Lauree Chandler.   1. Please arrive at the [facility] (Main Entrance A) at [facility]: [address] [place], [place] [address] at 11:30 AM (This time is two hours before your procedure to ensure your preparation). Free valet parking service is available.    Special note: Every effort is made to have your procedure done on time. Please understand that emergencies sometimes delay scheduled procedures.   2. Diet: Do not eat solid  foods after midnight.  The patient may have clear liquids until 5am upon the day of the procedure.   3. Labs: at dialysis tomorrow   4. Medication instructions in preparation for your procedure:    Contrast Allergy: No   On the morning of your procedure, take your Aspirin 81 mg and any morning medicines NOT listed above.  You may use sips of water.   5. Plan for one night stay--bring personal belongings. 6. Bring a current list of your medications and current insurance cards. 7. You MUST have a responsible person to drive you home. 8. Someone MUST be with you the first 24 hours after you arrive  home or your discharge will be delayed. 9. Please wear clothes that are easy to get on and off and wear slip-on shoes.   Thank you for allowing [place] to care for you!   -- [facility] Invasive Cardiovascular services   Signed, Donato Heinz, MD  11/22/2022 12:49 PM    [place]

## 2022-11-21 NOTE — Telephone Encounter (Signed)
Contacted patient, he can come tomorrow at 8:30 AM- scheduled with DOD (Dr.Schumann) patient verbalized understanding.   Will route to covering MD and DOD tomorrow.   Thanks!

## 2022-11-21 NOTE — Telephone Encounter (Signed)
-----  Message from Katrine Coho, RN sent at 11/21/2022  7:31 AM EST ----- Regarding: [facility] 11/24/22 Jena Gauss,  Last Office Visit with Dr Audie Box 09/23/22 Since this is more than 30 days prior to  cath, he is going to need an office visit before cath 11/24/22. Virtual Visit is okay.  Thanks, Webb Silversmith

## 2022-11-21 NOTE — H&P (View-Only) (Signed)
Cardiology Office Note:    Date:  11/22/2022   ID:  Eugenia Pancoast, DOB 1971/07/14, MRN [id number]  PCP:  Joesph July, MD  Cardiologist:  None  Electrophysiologist:  None   Referring MD: Joesph July, MD   Chief Complaint  Patient presents with   Cardiac Valve Problem    History of Present Illness:    Tipton Ballow is a 52 y.o. male with a hx of ESRD, pulmonary hypertension, moderate aortic stenosis, severe mitral regurgitation/tricuspid regurgitation who presents for follow-up.  He follows with Dr. Audie Box.  Underwent TEE on 09/05/2022, which showed severe mitral regurgitation due to flail A1 segment, torrential tricuspid regurgitation, mild aortic stenosis, EF 45 to 50%.  Saw Dr. Audie Box on 09/23/2022, RHC/LHC ordered to further workup valvular disease, scheduled for 11/24/2022.    Denies any chest pain but reports having dyspnea on exertion.  States that walking 50 feet at a brisk pace will feel short of breath.  He started stationary bike but not recently.  Reports has had issues with blood pressure being low during dialysis but not recently, states has been normotensive during dialysis.  Reports he makes minimal urine.  He has started back on CPAP and only using 2 to 3 days/week   Past Medical History:  Diagnosis Date   ADHD (attention deficit hyperactivity disorder)    Anemia    Anemia, chronic disease 07/13/2011   Anxiety    Arthritis    Bipolar 2 disorder ([place]) 08/18/2011   Diagnosed in 2011.    Bipolar disorder ([place])    Blood transfusion    Blood transfusion without reported diagnosis    Chronic diastolic CHF (congestive heart failure) (HCC)    Complication of anesthesia    Woke up intubated gets combative  afraid to be alone    Congestive heart failure (CHF) ([place]) 07/13/2011   Onset 2006.  Followed by Dr. Johnsie Cancel.  S/p cardiac catheterization in [facility] Dr. Marlou Porch..  S/p cardiac catheterization in 2014 by Nishan.  Echo 2014.    Depression    Erectile dysfunction     ESRD on hemodialysis ([place]) 07/13/2011   Glomerulonephritis at age 85, started HD in 89.  Deceased donor renal transplant 1999 at [facility], then kidney failed and went back on HD in 2006.  L forearm AVF . s/p repeat renal transplant 10/2014 [facility].   GI bleed 11/01/2011   Rectal bleeding with emesis/diarrhea    Hemodialysis status ([place])    Hydrocele, right 11/25/2014   Hypertension    Hypertensive emergency 01/06/2013   Hypoglycemia 01/11/2013   Influenza-like illness 12/18/2013   Mild ascending aorta dilatation (HCC)    NICM (nonischemic cardiomyopathy) (HCC)    Pericardial effusion    a. Mod by echo in 2014, similar to prior.   Peripheral polyneuropathy 08/04/2016   Pulmonary edema 01/06/2013   Pulmonary hypertension associated with end stage renal disease on dialysis [facility]) 07/13/2011   Renal transplant, status post 11/25/2014   Pt with Glomerulonephritis at age 28. Deceased donor renal transplant 1999 right and left renal transplant 10/2014.  Followed by [facility] transplant team; nephrologist is Dr. Barbette Merino.    Respiratory failure with hypoxia ([place]) 01/06/2013   Shortness of breath 12/12/2012   Swollen testicle 07/13/2011   URI (upper respiratory infection) 01/25/2012    Past Surgical History:  Procedure Laterality Date   ANGIOPLASTY     BUBBLE STUDY  09/05/2022   Procedure: BUBBLE STUDY;  Surgeon: Geralynn Rile, MD;  Location: [place];  Service:  Cardiovascular;;   CHOLECYSTECTOMY     DIALYSIS FISTULA CREATION     EVALUATION UNDER ANESTHESIA WITH HEMORRHOIDECTOMY N/A 03/15/2020   Procedure: CONTROL OF ANAL BLEEDING;  Surgeon: Ileana Roup, MD;  Location: [place];  Service: General;  Laterality: N/A;   KIDNEY TRANSPLANT  10/17/14   KNEE SURGERY     LEFT HEART CATHETERIZATION WITH CORONARY ANGIOGRAM N/A 10/01/2013   Procedure: LEFT HEART CATHETERIZATION WITH CORONARY ANGIOGRAM;  Surgeon: [name], MD;  Location: [facility] CATH LAB;  Service: Cardiovascular;  Laterality: N/A;   MOUTH  SURGERY     NEPHRECTOMY TRANSPLANTED ORGAN     TEE WITHOUT CARDIOVERSION N/A 09/05/2022   Procedure: TRANSESOPHAGEAL ECHOCARDIOGRAM (TEE);  Surgeon: Geralynn Rile, MD;  Location: [place];  Service: Cardiovascular;  Laterality: N/A;    Current Medications: Current Meds  Medication Sig   acetaminophen (TYLENOL) 500 MG tablet Take 1,000 mg by mouth daily as needed for moderate pain.   acyclovir (ZOVIRAX) 200 MG capsule Take 200 mg by mouth 2 (two) times daily.   amLODipine (NORVASC) 2.5 MG tablet Take 1 tablet (2.5 mg total) by mouth daily. (Patient taking differently: Take 2.5 mg by mouth See admin instructions. Take 2.5 mg on Tuesday and Thursday, Saturday and Sunday once a day in the morning)   amphetamine-dextroamphetamine (ADDERALL XR) 20 MG 24 hr capsule Take 20 mg by mouth every morning.   amphetamine-dextroamphetamine (ADDERALL) 10 MG tablet Take 10 mg by mouth every evening.   bismuth subsalicylate (PEPTO BISMOL) 262 MG chewable tablet Chew 524 mg by mouth as needed for indigestion.   calcium carbonate (TUMS EX) 750 MG chewable tablet Chew 1,500 mg by mouth See admin instructions. Take 1500 mg twice daily with snacks   carvedilol (COREG) 12.5 MG tablet Take 12.5 mg by mouth See admin instructions. Take 12.5 mg on Monday,Wednesday and Friday once a day in the morning   carvedilol (COREG) 25 MG tablet Take 1 tablet (25 mg total) by mouth 2 times daily with meals. (Patient taking differently: Take 25 mg by mouth See admin instructions. Take 25 mg twice a day on Tuesday,Thursday, Saturday and Sunday)   clobetasol ointment (TEMOVATE) 0.25 % Apply 1 Application topically 2 (two) times daily as needed (rash).   clonazePAM (KLONOPIN) 0.5 MG tablet Take 1 mg by mouth See admin instructions. Take 1 mg Monday, Wednesday and Friday in the evening   HYDROcodone-acetaminophen (NORCO) 7.5-325 MG tablet Take 1 tablet by mouth 3 (three) times daily as needed for severe pain or moderate pain.    lidocaine-prilocaine (EMLA) cream Apply 1 Application topically every Monday, Wednesday, and Friday. One hour prior to Dialysis   sevelamer carbonate (RENVELA) 0.8 g PACK packet Take 0.8-1.6 g by mouth 3 (three) times daily with meals.     Allergies:   Delacort [hydrocortisone base]; Benicar [olmesartan]; Diltiazem; Food; Hydralazine; Nitrates, organic; and Psyllium   Social History   Socioeconomic History   Marital status: Single    Spouse name: Not on file   Number of children: 0   Years of education: 20+   Highest education level: Not on file  Occupational History   Occupation: Chef- fulltime  Tobacco Use   Smoking status: Former    Types: Cigars   Smokeless tobacco: Never  Scientific laboratory technician Use: Never used  Substance and Sexual Activity   Alcohol use: No    Alcohol/week: 1.0 standard drink of alcohol    Types: 1 Standard drinks or equivalent per week  Comment: Stopped drinking after Kidney transplant in December 2015   Drug use: No   Sexual activity: Yes  Other Topics Concern   Not on file  Social History Narrative   Marital status: single; dating      Children; none      Lives: alone      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      Tobacco: none      Alcohol: none      Drugs: none      Exercise: cycling; 3 days per week.      Sexual activity: females only; no male encounters; Chlamydia age 35.  Total partner = 55.  Condoms.      Caffeine use: Soda sometimes   Social Determinants of Radio broadcast assistant Strain: Not on file  Food Insecurity: Not on file  Transportation Needs: Not on file  Physical Activity: Not on file  Stress: Not on file  Social Connections: Not on file     Family History: The patient's family history includes Arthritis in his father and mother; Depression in his father and mother; Diabetes in his father and mother; Hypertension in his father and mother.  ROS:   Please see  the history of present illness.     All other systems reviewed and are negative.  EKGs/Labs/Other Studies Reviewed:    The following studies were reviewed today:   EKG:   11/22/22: Sinus rhythm with first-degree AV block, rate 72, left axis deviation, LVH, T wave inversion in I, aVL  Recent Labs: 09/05/2022: BUN 44; Creatinine, Ser 12.70; Hemoglobin 13.9; Potassium 4.9; Sodium 138  Recent Lipid Panel    Component Value Date/Time   CHOL  11/04/2007 0424    83        ATP III CLASSIFICATION:  <200     mg/dL   Desirable  200-239  mg/dL   Borderline High  >=240    mg/dL   High   TRIG 60 11/04/2007 0424   HDL 20 (L) 11/04/2007 0424   CHOLHDL 4.2 11/04/2007 0424   VLDL 12 11/04/2007 0424   LDLCALC  11/04/2007 0424    51        Total Cholesterol/HDL:CHD Risk Coronary Heart Disease Risk Table                     Men   Women  1/2 Average Risk   3.4   3.3    Physical Exam:    VS:  BP (!) 162/104   Pulse 72   Ht '6\' 2"'$  (1.88 m)   Wt 211 lb (95.7 kg)   SpO2 99%   BMI 27.09 kg/m     Wt Readings from Last 3 Encounters:  11/22/22 211 lb (95.7 kg)  09/23/22 216 lb 3.2 oz (98.1 kg)  09/05/22 209 lb 7 oz (95 kg)     GEN:  Well nourished, well developed in no acute distress HEENT: Normal NECK: + JVD LYMPHATICS: No lymphadenopathy CARDIAC: RRR, 3/6 systolic murmur RESPIRATORY:  Clear to auscultation without rales, wheezing or rhonchi  ABDOMEN: Soft, non-tender, non-distended MUSCULOSKELETAL:  No edema; No deformity  SKIN: Warm and dry NEUROLOGIC:  Alert and oriented x 3 PSYCHIATRIC:  Normal affect   ASSESSMENT:    1. Nonrheumatic mitral valve regurgitation   2. Pulmonary hypertension, unspecified ([place])   3. Chronic systolic heart failure ([place])   4. OSA (obstructive sleep apnea)  PLAN:    Severe mitral vegetation: Flail A1 segment on TEE 08/2022.  LHC/RHC ordered for further evaluation, scheduled with Dr. Angelena Form on 11/24/2022.  Risks and benefits of cardiac  catheterization have been discussed with the patient.  These include bleeding, infection, kidney damage, stroke, heart attack, death.  The patient understands these risks and is willing to proceed.  He needs BMET/CBC drawn prior to procedure but declines to have drawn today; he is willing to have drawn in HD on 1/17.    Severe pulmonary hypertension: Noted on recent echo.  RHC planned for further evaluation as above  Chronic systolic heart failure: EF 45 to 50%.  On carvedilol.  On HD for volume management.  Appears euvolemic except for JVD on exam, which is likely due to severe TR.  BP elevated in clinic today but reports has been normotensive in HD, previously had issues with low BP during HD.  OSA: on CPAP, uses about 2-3 days per week.  Encouraged compliance.    Follow-up with Dr. Audie Box as scheduled   Medication Adjustments/Labs and Tests Ordered: Current medicines are reviewed at length with the patient today.  Concerns regarding medicines are outlined above.  Orders Placed This Encounter  Procedures   EKG 12-Lead   No orders of the defined types were placed in this encounter.   Patient Instructions  Medication Instructions:  Your physician recommends that you continue on your current medications as directed. Please refer to the Current Medication list given to you today.  *If you need a refill on your cardiac medications before your next appointment, please call your pharmacy*   Lab Work: BMET, CBC tomorrow at Dialysis  If you have labs (blood work) drawn today and your tests are completely normal, you will receive your results only by: [place] (if you have MyChart) OR A paper copy in the mail If you have any lab test that is abnormal or we need to change your treatment, we will call you to review the results.  Follow-Up: At [facility], you and your health needs are our priority.  As part of our continuing mission to provide you with exceptional heart  care, we have created designated Provider Care Teams.  These Care Teams include your primary Cardiologist (physician) and Advanced Practice Providers (APPs -  Physician Assistants and Nurse Practitioners) who all work together to provide you with the care you need, when you need it.  We recommend signing up for the patient portal called "MyChart".  Sign up information is provided on this After Visit Summary.  MyChart is used to connect with patients for Virtual Visits (Telemedicine).  Patients are able to view lab/test results, encounter notes, upcoming appointments, etc.  Non-urgent messages can be sent to your provider as well.   To learn more about what you can do with MyChart, go to [email or web address].    Your next appointment:   As scheduled with Dr. Rex Kras are scheduled for a Cardiac Catheterization on Thursday, January 18 with Dr. Lauree Chandler.   1. Please arrive at the [facility] (Main Entrance A) at [facility]: [address] [place], [place] [address] at 11:30 AM (This time is two hours before your procedure to ensure your preparation). Free valet parking service is available.    Special note: Every effort is made to have your procedure done on time. Please understand that emergencies sometimes delay scheduled procedures.   2. Diet: Do not eat solid  foods after midnight.  The patient may have clear liquids until 5am upon the day of the procedure.   3. Labs: at dialysis tomorrow   4. Medication instructions in preparation for your procedure:    Contrast Allergy: No   On the morning of your procedure, take your Aspirin 81 mg and any morning medicines NOT listed above.  You may use sips of water.   5. Plan for one night stay--bring personal belongings. 6. Bring a current list of your medications and current insurance cards. 7. You MUST have a responsible person to drive you home. 8. Someone MUST be with you the first 24 hours after you arrive  home or your discharge will be delayed. 9. Please wear clothes that are easy to get on and off and wear slip-on shoes.   Thank you for allowing [place] to care for you!   -- [facility] Invasive Cardiovascular services   Signed, Donato Heinz, MD  11/22/2022 12:49 PM    [place]

## 2022-11-22 ENCOUNTER — Encounter: Payer: Self-pay | Admitting: Cardiology

## 2022-11-22 ENCOUNTER — Ambulatory Visit: Payer: Medicare HMO | Attending: Cardiology | Admitting: Cardiology

## 2022-11-22 ENCOUNTER — Telehealth: Payer: Self-pay | Admitting: *Deleted

## 2022-11-22 VITALS — BP 162/104 | HR 72 | Ht 74.0 in | Wt 211.0 lb

## 2022-11-22 DIAGNOSIS — I340 Nonrheumatic mitral (valve) insufficiency: Secondary | ICD-10-CM

## 2022-11-22 DIAGNOSIS — G4733 Obstructive sleep apnea (adult) (pediatric): Secondary | ICD-10-CM | POA: Diagnosis not present

## 2022-11-22 DIAGNOSIS — I5022 Chronic systolic (congestive) heart failure: Secondary | ICD-10-CM | POA: Diagnosis not present

## 2022-11-22 DIAGNOSIS — I272 Pulmonary hypertension, unspecified: Secondary | ICD-10-CM | POA: Diagnosis not present

## 2022-11-22 NOTE — Telephone Encounter (Signed)
Sounds good, will do

## 2022-11-22 NOTE — Patient Instructions (Addendum)
Medication Instructions:  Your physician recommends that you continue on your current medications as directed. Please refer to the Current Medication list given to you today.  *If you need a refill on your cardiac medications before your next appointment, please call your pharmacy*   Lab Work: BMET, CBC tomorrow at Dialysis  If you have labs (blood work) drawn today and your tests are completely normal, you will receive your results only by: [place] (if you have MyChart) OR A paper copy in the mail If you have any lab test that is abnormal or we need to change your treatment, we will call you to review the results.  Follow-Up: At [facility], you and your health needs are our priority.  As part of our continuing mission to provide you with exceptional heart care, we have created designated Provider Care Teams.  These Care Teams include your primary Cardiologist (physician) and Advanced Practice Providers (APPs -  Physician Assistants and Nurse Practitioners) who all work together to provide you with the care you need, when you need it.  We recommend signing up for the patient portal called "MyChart".  Sign up information is provided on this After Visit Summary.  MyChart is used to connect with patients for Virtual Visits (Telemedicine).  Patients are able to view lab/test results, encounter notes, upcoming appointments, etc.  Non-urgent messages can be sent to your provider as well.   To learn more about what you can do with MyChart, go to [email or web address].    Your next appointment:   As scheduled with Dr. Rex Kras are scheduled for a Cardiac Catheterization on Thursday, January 18 with Dr. Lauree Chandler.   1. Please arrive at the [facility] (Main Entrance A) at [facility]: [address] [place], [place] [address] at 11:30 AM (This time is two hours before your procedure to ensure your preparation). Free valet parking service is  available.    Special note: Every effort is made to have your procedure done on time. Please understand that emergencies sometimes delay scheduled procedures.   2. Diet: Do not eat solid foods after midnight.  The patient may have clear liquids until 5am upon the day of the procedure.   3. Labs: at dialysis tomorrow   4. Medication instructions in preparation for your procedure:    Contrast Allergy: No   On the morning of your procedure, take your Aspirin 81 mg and any morning medicines NOT listed above.  You may use sips of water.   5. Plan for one night stay--bring personal belongings. 6. Bring a current list of your medications and current insurance cards. 7. You MUST have a responsible person to drive you home. 8. Someone MUST be with you the first 24 hours after you arrive home or your discharge will be delayed. 9. Please wear clothes that are easy to get on and off and wear slip-on shoes.   Thank you for allowing [place] to care for you!   -- [place] Invasive Cardiovascular services

## 2022-11-22 NOTE — Telephone Encounter (Signed)
Patient reports IVs are usually started by anesthesia in a foot since neither his right or left arm can be used for IV access.

## 2022-11-22 NOTE — Telephone Encounter (Addendum)
Cardiac Catheterization scheduled at [facility] for: Thursday November 24, 2022 1:30 PM Arrival time and place: [place] Entrance A at: 34 AM-needs BMP/CBC * Patient is aware cath scheduled with Dr Angelena Form.   Nothing to eat after midnight prior to procedure, clear liquids until 5 AM day of procedure.  Medication instructions: -Usual morning medications can be taken with sips of water including aspirin 81 mg.  Confirmed patient has responsible adult to drive home post procedure and be with patient first 24 hours after arriving home.  Patient reports no new symptoms concerning for COVID-19 in the past 10 days.  Reviewed procedure instructions with patient, pt aware arrival time 65 AM to allow time for pre-procedure BMP/CBC on arrival to hospital 11/24/22.  *Patient declined to get BMP/CBC at time of 11/22/22 Office Visit, wanted to get at dialysis 11/23/22. Our office was told by dialysis center they could do BMP/CBC 11/23/22, but would not have results until Friday 11/25/22. Patient aware orders were not given to dialysis center for BMP/CBC 11/23/22 and he will need lab done at hospital on arrival since would not have results until 11/25/22 if done at dialysis 11/23/22.

## 2022-11-24 ENCOUNTER — Other Ambulatory Visit: Payer: Self-pay

## 2022-11-24 ENCOUNTER — Ambulatory Visit (HOSPITAL_COMMUNITY)
Admission: RE | Admit: 2022-11-24 | Discharge: 2022-11-24 | Disposition: A | Discharge status: Home / Self Care | Payer: Medicare HMO | Attending: Cardiovascular Disease | Admitting: Cardiovascular Disease

## 2022-11-24 ENCOUNTER — Encounter (HOSPITAL_COMMUNITY)
Admission: RE | Disposition: A | Discharge status: Home / Self Care | Payer: Self-pay | Source: Home / Self Care | Attending: Cardiovascular Disease

## 2022-11-24 DIAGNOSIS — Z940 Kidney transplant status: Secondary | ICD-10-CM | POA: Diagnosis not present

## 2022-11-24 DIAGNOSIS — I132 Hypertensive heart and chronic kidney disease with heart failure and with stage 5 chronic kidney disease, or end stage renal disease: Secondary | ICD-10-CM | POA: Diagnosis not present

## 2022-11-24 DIAGNOSIS — I272 Pulmonary hypertension, unspecified: Secondary | ICD-10-CM | POA: Insufficient documentation

## 2022-11-24 DIAGNOSIS — I5042 Chronic combined systolic (congestive) and diastolic (congestive) heart failure: Secondary | ICD-10-CM | POA: Diagnosis not present

## 2022-11-24 DIAGNOSIS — N186 End stage renal disease: Secondary | ICD-10-CM | POA: Insufficient documentation

## 2022-11-24 DIAGNOSIS — Z992 Dependence on renal dialysis: Secondary | ICD-10-CM | POA: Diagnosis not present

## 2022-11-24 DIAGNOSIS — I340 Nonrheumatic mitral (valve) insufficiency: Secondary | ICD-10-CM | POA: Diagnosis not present

## 2022-11-24 DIAGNOSIS — Z79899 Other long term (current) drug therapy: Secondary | ICD-10-CM | POA: Diagnosis not present

## 2022-11-24 DIAGNOSIS — I2510 Atherosclerotic heart disease of native coronary artery without angina pectoris: Secondary | ICD-10-CM | POA: Diagnosis not present

## 2022-11-24 DIAGNOSIS — Z87891 Personal history of nicotine dependence: Secondary | ICD-10-CM | POA: Diagnosis not present

## 2022-11-24 DIAGNOSIS — Z8249 Family history of ischemic heart disease and other diseases of the circulatory system: Secondary | ICD-10-CM | POA: Diagnosis not present

## 2022-11-24 DIAGNOSIS — Z01812 Encounter for preprocedural laboratory examination: Secondary | ICD-10-CM

## 2022-11-24 DIAGNOSIS — G4733 Obstructive sleep apnea (adult) (pediatric): Secondary | ICD-10-CM | POA: Diagnosis not present

## 2022-11-24 HISTORY — PX: RIGHT/LEFT HEART CATH AND CORONARY ANGIOGRAPHY: CATH118266

## 2022-11-24 LAB — POCT I-STAT 7, (LYTES, BLD GAS, ICA,H+H)
Acid-Base Excess: 3 mmol/L — ABNORMAL HIGH (ref 0.0–2.0)
Bicarbonate: 28 mmol/L (ref 20.0–28.0)
Calcium, Ion: 1.11 mmol/L — ABNORMAL LOW (ref 1.15–1.40)
HCT: 36 % — ABNORMAL LOW (ref 39.0–52.0)
Hemoglobin: 12.2 g/dL — ABNORMAL LOW (ref 13.0–17.0)
O2 Saturation: 98 %
Potassium: 4.7 mmol/L (ref 3.5–5.1)
Sodium: 139 mmol/L (ref 135–145)
TCO2: 29 mmol/L (ref 22–32)
pCO2 arterial: 45.3 mmHg (ref 32–48)
pH, Arterial: 7.4 (ref 7.35–7.45)
pO2, Arterial: 113 mmHg — ABNORMAL HIGH (ref 83–108)

## 2022-11-24 LAB — CBC
HCT: 34.3 % — ABNORMAL LOW (ref 39.0–52.0)
Hemoglobin: 11 g/dL — ABNORMAL LOW (ref 13.0–17.0)
MCH: 32.4 pg (ref 26.0–34.0)
MCHC: 32.1 g/dL (ref 30.0–36.0)
MCV: 100.9 fL — ABNORMAL HIGH (ref 80.0–100.0)
Platelets: 202 10*3/uL (ref 150–400)
RBC: 3.4 MIL/uL — ABNORMAL LOW (ref 4.22–5.81)
RDW: 15.6 % — ABNORMAL HIGH (ref 11.5–15.5)
WBC: 4.1 10*3/uL (ref 4.0–10.5)
nRBC: 0 % (ref 0.0–0.2)

## 2022-11-24 LAB — POCT I-STAT EG7
Acid-Base Excess: 3 mmol/L — ABNORMAL HIGH (ref 0.0–2.0)
Bicarbonate: 29.3 mmol/L — ABNORMAL HIGH (ref 20.0–28.0)
Calcium, Ion: 1.15 mmol/L (ref 1.15–1.40)
HCT: 37 % — ABNORMAL LOW (ref 39.0–52.0)
Hemoglobin: 12.6 g/dL — ABNORMAL LOW (ref 13.0–17.0)
O2 Saturation: 78 %
Potassium: 4.9 mmol/L (ref 3.5–5.1)
Sodium: 138 mmol/L (ref 135–145)
TCO2: 31 mmol/L (ref 22–32)
pCO2, Ven: 49.1 mmHg (ref 44–60)
pH, Ven: 7.384 (ref 7.25–7.43)
pO2, Ven: 43 mmHg (ref 32–45)

## 2022-11-24 LAB — BASIC METABOLIC PANEL
Anion gap: 13 (ref 5–15)
BUN: 35 mg/dL — ABNORMAL HIGH (ref 6–20)
CO2: 24 mmol/L (ref 22–32)
Calcium: 8.9 mg/dL (ref 8.9–10.3)
Chloride: 96 mmol/L — ABNORMAL LOW (ref 98–111)
Creatinine, Ser: 8.05 mg/dL — ABNORMAL HIGH (ref 0.61–1.24)
GFR, Estimated: 8 mL/min — ABNORMAL LOW (ref >60)
Glucose, Bld: 74 mg/dL (ref 70–99)
Potassium: 4.5 mmol/L (ref 3.5–5.1)
Sodium: 133 mmol/L — ABNORMAL LOW (ref 135–145)

## 2022-11-24 SURGERY — RIGHT/LEFT HEART CATH AND CORONARY ANGIOGRAPHY
Anesthesia: LOCAL

## 2022-11-24 MED ORDER — SODIUM CHLORIDE 0.9% FLUSH: 3.0000 mL | Freq: Two times a day (BID) | INTRAVENOUS | Status: DC

## 2022-11-24 MED ORDER — FENTANYL CITRATE (PF) 100 MCG/2ML IJ SOLN
INTRAMUSCULAR | Status: AC
  Filled 2022-11-24: qty 2

## 2022-11-24 MED ORDER — SODIUM CHLORIDE 0.9% FLUSH: 3.0000 mL | INTRAVENOUS | Status: DC | PRN

## 2022-11-24 MED ORDER — SODIUM CHLORIDE 0.9 % IV SOLN: 250.0000 mL | INTRAVENOUS | Status: DC | PRN

## 2022-11-24 MED ORDER — HEPARIN SODIUM (PORCINE) 1000 UNIT/ML IJ SOLN
INTRAMUSCULAR | Status: AC
  Filled 2022-11-24: qty 10

## 2022-11-24 MED ORDER — SODIUM CHLORIDE 0.9 % IV SOLN: INTRAVENOUS | Status: DC

## 2022-11-24 MED ORDER — FENTANYL CITRATE (PF) 100 MCG/2ML IJ SOLN
INTRAMUSCULAR | Status: DC | PRN
  Administered 2022-11-24: 25 ug via INTRAVENOUS

## 2022-11-24 MED ORDER — HEPARIN (PORCINE) IN NACL 1000-0.9 UT/500ML-% IV SOLN
INTRAVENOUS | Status: AC
  Filled 2022-11-24: qty 1000

## 2022-11-24 MED ORDER — HEPARIN (PORCINE) IN NACL 1000-0.9 UT/500ML-% IV SOLN
INTRAVENOUS | Status: DC | PRN
  Administered 2022-11-24 (×2): 500 mL

## 2022-11-24 MED ORDER — ACETAMINOPHEN 325 MG PO TABS: 650.0000 mg | ORAL_TABLET | ORAL | Status: DC | PRN

## 2022-11-24 MED ORDER — ASPIRIN 81 MG PO CHEW: 81.0000 mg | CHEWABLE_TABLET | ORAL | Status: DC

## 2022-11-24 MED ORDER — LIDOCAINE HCL (PF) 1 % IJ SOLN
INTRAMUSCULAR | Status: AC
  Filled 2022-11-24: qty 30

## 2022-11-24 MED ORDER — LABETALOL HCL 5 MG/ML IV SOLN: 10.0000 mg | INTRAVENOUS | Status: DC | PRN

## 2022-11-24 MED ORDER — SODIUM CHLORIDE 0.9 % IV SOLN
INTRAVENOUS | Status: DC | PRN
  Administered 2022-11-24: 10 mL/h via INTRAVENOUS

## 2022-11-24 MED ORDER — IOHEXOL 350 MG/ML SOLN
INTRAVENOUS | Status: DC | PRN
  Administered 2022-11-24: 45 mL

## 2022-11-24 MED ORDER — MIDAZOLAM HCL 2 MG/2ML IJ SOLN
INTRAMUSCULAR | Status: DC | PRN
  Administered 2022-11-24: 1 mg via INTRAVENOUS

## 2022-11-24 MED ORDER — LIDOCAINE HCL (PF) 1 % IJ SOLN
INTRAMUSCULAR | Status: DC | PRN
  Administered 2022-11-24: 10 mL

## 2022-11-24 MED ORDER — MIDAZOLAM HCL 2 MG/2ML IJ SOLN
INTRAMUSCULAR | Status: AC
  Filled 2022-11-24: qty 2

## 2022-11-24 MED ORDER — ONDANSETRON HCL 4 MG/2ML IJ SOLN: 4.0000 mg | Freq: Four times a day (QID) | INTRAMUSCULAR | Status: DC | PRN

## 2022-11-24 SURGICAL SUPPLY — 13 items
CATH INFINITI 5FR MULTPACK ANG (CATHETERS) IMPLANT
CATH SWAN GANZ 7F STRAIGHT (CATHETERS) IMPLANT
CLOSURE MYNX CONTROL 5F (Vascular Products) IMPLANT
KIT HEART LEFT (KITS) ×2 IMPLANT
KIT MICROPUNCTURE NIT STIFF (SHEATH) IMPLANT
PACK CARDIAC CATHETERIZATION (CUSTOM PROCEDURE TRAY) ×2 IMPLANT
SHEATH PINNACLE 5F 10CM (SHEATH) IMPLANT
SHEATH PINNACLE 7F 10CM (SHEATH) IMPLANT
SHEATH PROBE COVER 6X72 (BAG) IMPLANT
TRANSDUCER W/STOPCOCK (MISCELLANEOUS) ×2 IMPLANT
TUBING CIL FLEX 10 FLL-RA (TUBING) ×2 IMPLANT
WIRE EMERALD 3MM-J .025X260CM (WIRE) IMPLANT
WIRE EMERALD 3MM-J .035X150CM (WIRE) IMPLANT

## 2022-11-24 NOTE — Telephone Encounter (Signed)
Patient reports fistula in right arm is currently working fistula for dialysis.  Patient reports previous fistula in left arm was removed but caused some problem and needed repair to radial artery.  He reports vascular surgeon is Dr Nicola Girt in [place] 04/27/22 OP Visit Note for details.  Patient will arrive this morning as close to 10:30 AM as possible as requested by cath lab.

## 2022-11-24 NOTE — Progress Notes (Signed)
Pt ambulated to and from bathroom with no oozing from site

## 2022-11-24 NOTE — Progress Notes (Signed)
Patient has a working fistula right arm and work done to left arm and was told not to use. Spoke with Anderson Malta who stated we could use left hand for IV. Lynda Wanninger aware that IV nurse stated she would only be able to get a 24 gauge in left hand.

## 2022-11-24 NOTE — Interval H&P Note (Signed)
History and Physical Interval Note:  11/24/2022 1:02 PM  [name]  has presented today for surgery, with the diagnosis of mitral regurge.  The various methods of treatment have been discussed with the patient and family. After consideration of risks, benefits and other options for treatment, the patient has consented to  Procedure(s): RIGHT/LEFT HEART CATH AND CORONARY ANGIOGRAPHY (N/A) as a surgical intervention.  The patient's history has been reviewed, patient examined, no change in status, stable for surgery.  I have reviewed the patient's chart and labs.  Questions were answered to the patient's satisfaction.    Cath Lab Visit (complete for each Cath Lab visit)  Clinical Evaluation Leading to the Procedure:   ACS: No.  Non-ACS:    Anginal Classification: No Symptoms  Anti-ischemic medical therapy: Maximal Therapy (2 or more classes of medications)  Non-Invasive Test Results: No non-invasive testing performed  Prior CABG: No previous CABG        Lauree Chandler

## 2022-11-25 ENCOUNTER — Encounter (HOSPITAL_COMMUNITY): Payer: Self-pay | Admitting: Cardiovascular Disease

## 2022-12-05 ENCOUNTER — Telehealth: Payer: Self-pay | Admitting: Cardiovascular Disease

## 2022-12-05 ENCOUNTER — Other Ambulatory Visit: Payer: Self-pay

## 2022-12-05 DIAGNOSIS — I361 Nonrheumatic tricuspid (valve) insufficiency: Secondary | ICD-10-CM

## 2022-12-05 DIAGNOSIS — I340 Nonrheumatic mitral (valve) insufficiency: Secondary | ICD-10-CM

## 2022-12-05 DIAGNOSIS — I350 Nonrheumatic aortic (valve) stenosis: Secondary | ICD-10-CM

## 2022-12-05 NOTE — Telephone Encounter (Signed)
Called and discussed recent left and right heart cath with Mr. [name].  On my review his right heart failure seems to be driven by left heart failure from mitral valve disease.  This has been a clear change.  Discussed his case briefly with interventional cardiology.  They believe he may best be served by cardiac surgery.  We will least have him evaluated.  We will refer to Dr. Lavonna [name] for mitral valve repair, tricuspid valve repair, aortic valve replacement.  [name], MD, [place]  [address] [place], [place] [address] [phone]  9:57 AM

## 2022-12-05 NOTE — Telephone Encounter (Signed)
Referral placed.

## 2022-12-11 NOTE — Progress Notes (Deleted)
[address]       [place] [address]             [phone]           Jalil Herberg [place] Medical Record [id number] Date of Birth: 11-12-1970  Geralynn Rile, * Joesph July, MD  Chief Complaint:  Low energy, severe MR/TR  History of Present Illness:     Pt is a 52 yo male who has been followed by cardiology for mr and tr and mild as. Pt has been having low energy and recent echo with worsening MR and moderate to severe PHTN. Pt had this further evaluated with TEE that confrimed his EF 45% and severe MR secondary to what is felt to be A45fail. He has in addition severe TR and Mild AS with mean gradients of 11-123mg. He underwent cath with Pulm pressure systolic of [id number] no CAD. Secondary to his symptoms and worsening MR it was felt to be considered for surgical evaluation. 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       Past Medical History:  Diagnosis Date   ADHD (attention deficit hyperactivity disorder)    Anemia    Anemia, chronic disease 07/13/2011   Anxiety    Arthritis    Bipolar 2 disorder (HCM[facility]wood10/09/2011   Diagnosed in 2011.    Bipolar disorder (HCC[facility]   Blood transfusion    Blood transfusion without reported diagnosis    Chronic diastolic CHF (congestive heart failure) (HCC)    Complication of anesthesia    Woke up intubated gets combative  afraid to be alone    Congestive heart failure (CHF) (HCWanatah9/03/2011   Onset 2006.  Followed [facility] Dr. NiJohnsie Cancel [facility]  S/p cardiac catheterization in 2014 by Nishan.  Echo 2014.    Depression    Erectile dysfunction    ESRD on hemodialysis (HCOhlman9/03/2011   Glomerulonephritis at age 89started HD in 1943 [facility] and went back on HD in 2006[facility]  L forearm AVF . s/p repeat renal transplant 10/2014 [facility].   GI bleed 11/01/2011   Rectal bleeding with  emesis/diarrhea    Hemodialysis status (HCMagas Arriba   Hydrocele, right 11/25/2014   Hypertension    Hypertensive emergency 01/06/2013   Hypoglycemia 01/11/2013   Influenza-like illness 12/18/2013   Mild ascending aorta dilatation (HCC)    NICM (nonischemic cardiomyopathy) (HCC)    Pericardial effusion    a. Mod by echo in 2014, similar to prior.   Peripheral polyneuropathy 08/04/2016   Pulmonary edema 01/06/2013   Pulmonary hypertension associated with end stage renal disease on dialysis ([facility]2011   Renal transplant, status post 11/25/2014   Pt with Glomerulonephritis at age 89Deceased donor renal transplant 1999 right and left renal transplant 10/2014.  [facility]; nephrologist is Dr. DaBarbette Merino   Respiratory failure with hypoxia (HCWestmont3/12/2012   Shortness of breath 12/12/2012   Swollen testicle 07/13/2011   URI (upper respiratory infection) 01/25/2012    Past Surgical History:  Procedure Laterality Date   ANGIOPLASTY     BUBBLE STUDY  09/05/2022   Procedure: BUBBLE STUDY;  Surgeon: O'Geralynn RileMD;  Location: [facility] Service: Cardiovascular;;   CHOLECYSTECTOMY     DIALYSIS FISTULA CREATION     EVALUATION UNDER ANESTHESIA WITH  HEMORRHOIDECTOMY N/A 03/15/2020   Procedure: CONTROL OF ANAL BLEEDING;  Surgeon: Ileana Roup, MD;  Location: [place];  Service: General;  Laterality: N/A;   KIDNEY TRANSPLANT  10/17/14   KNEE SURGERY     LEFT HEART CATHETERIZATION WITH CORONARY ANGIOGRAM N/A 10/01/2013   Procedure: LEFT HEART CATHETERIZATION WITH CORONARY ANGIOGRAM;  Surgeon: [name], MD;  Location: [facility] CATH LAB;  Service: Cardiovascular;  Laterality: N/A;   MOUTH SURGERY     NEPHRECTOMY TRANSPLANTED ORGAN     RIGHT/LEFT HEART CATH AND CORONARY ANGIOGRAPHY N/A 11/24/2022   Procedure: RIGHT/LEFT HEART CATH AND CORONARY ANGIOGRAPHY;  Surgeon: Burnell Blanks, MD;  Location: [place] CV LAB;  Service: Cardiovascular;  Laterality: N/A;    TEE WITHOUT CARDIOVERSION N/A 09/05/2022   Procedure: TRANSESOPHAGEAL ECHOCARDIOGRAM (TEE);  Surgeon: Geralynn Rile, MD;  Location: [place];  Service: Cardiovascular;  Laterality: N/A;    Social History   Tobacco Use  Smoking Status Former   Types: Cigars  Smokeless Tobacco Never    Social History   Substance and Sexual Activity  Alcohol Use No   Alcohol/week: 1.0 standard drink of alcohol   Types: 1 Standard drinks or equivalent per week   Comment: Stopped drinking after Kidney transplant in December 2015    Social History   Socioeconomic History   Marital status: Single    Spouse name: Not on file   Number of children: 0   Years of education: 20+   Highest education level: Not on file  Occupational History   Occupation: Chef- fulltime  Tobacco Use   Smoking status: Former    Types: Cigars   Smokeless tobacco: Never  Scientific laboratory technician Use: Never used  Substance and Sexual Activity   Alcohol use: No    Alcohol/week: 1.0 standard drink of alcohol    Types: 1 Standard drinks or equivalent per week    Comment: Stopped drinking after Kidney transplant in December 2015   Drug use: No   Sexual activity: Yes  Other Topics Concern   Not on file  Social History Narrative   Marital status: single; dating      Children; none      Lives: alone      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      Tobacco: none      Alcohol: none      Drugs: none      Exercise: cycling; 3 days per week.      Sexual activity: females only; no male encounters; Chlamydia age 15.  Total partner = 55.  Condoms.      Caffeine use: Soda sometimes   Social Determinants of Radio broadcast assistant Strain: Not on file  Food Insecurity: Not on file  Transportation Needs: Not on file  Physical Activity: Not on file  Stress: Not on file  Social Connections: Not on file  Intimate Partner Violence: Not on file    Allergies   Allergen Reactions   Delacort [Hydrocortisone Base] Nausea Only   Benicar [Olmesartan]     Unknown   Diltiazem Other (See Comments)    Tachycardia   Food Itching    bananas   Hydralazine Other (See Comments)    Tachycardia   Nitrates, Organic Other (See Comments)    Palpitations, headache   Psyllium Other (See Comments)    Unknown    Current Outpatient Medications  Medication Sig Dispense Refill  acetaminophen (TYLENOL) 500 MG tablet Take 1,000 mg by mouth daily as needed for moderate pain.     acyclovir (ZOVIRAX) 200 MG capsule Take 200 mg by mouth 2 (two) times daily.     amLODipine (NORVASC) 2.5 MG tablet Take 1 tablet (2.5 mg total) by mouth daily. (Patient taking differently: Take 2.5 mg by mouth See admin instructions. Take 2.5 mg on Tuesday and Thursday, Saturday and Sunday once a day in the morning) 90 tablet 3   amphetamine-dextroamphetamine (ADDERALL XR) 20 MG 24 hr capsule Take 20 mg by mouth every morning.     amphetamine-dextroamphetamine (ADDERALL) 10 MG tablet Take 10 mg by mouth every evening.     bismuth subsalicylate (PEPTO BISMOL) 262 MG chewable tablet Chew 524 mg by mouth as needed for indigestion.     calcium carbonate (TUMS EX) 750 MG chewable tablet Chew 1,500 mg by mouth See admin instructions. Take 1500 mg twice daily with snacks     carvedilol (COREG) 12.5 MG tablet Take 12.5 mg by mouth See admin instructions. Take 12.5 mg on Monday,Wednesday and Friday once a day in the morning     carvedilol (COREG) 25 MG tablet Take 1 tablet (25 mg total) by mouth 2 times daily with meals. (Patient taking differently: Take 25 mg by mouth See admin instructions. Take 25 mg twice a day on Tuesday,Thursday, Saturday and Sunday) 180 tablet 2   clobetasol ointment (TEMOVATE) [id number] % Apply 1 Application topically 2 (two) times daily as needed (rash).     clonazePAM (KLONOPIN) 0.5 MG tablet Take 1 mg by mouth See admin instructions. Take 1 mg Monday, Wednesday and Friday in the  evening     HYDROcodone-acetaminophen (NORCO) 7.5-325 MG tablet Take 1 tablet by mouth 3 (three) times daily as needed for severe pain or moderate pain.     lidocaine-prilocaine (EMLA) cream Apply 1 Application topically every Monday, Wednesday, and Friday. One hour prior to Dialysis     sevelamer carbonate (RENVELA) 0.8 g PACK packet Take 0.8-1.6 g by mouth 3 (three) times daily with meals.     No current facility-administered medications for this visit.     Family History  Problem Relation Age of Onset   Arthritis Mother    Hypertension Mother    Diabetes Mother    Depression Mother    Arthritis Father    Hypertension Father    Diabetes Father    Depression Father        Physical Exam:      Diagnostic Studies & Laboratory data: I have personally reviewed the following studies and agree with the findings   TTE (06/2022) IMPRESSIONS     1. Left ventricular ejection fraction, by estimation, is 45 to 50%. The  left ventricle has mildly decreased function. The left ventricle  demonstrates global hypokinesis. The left ventricular internal cavity size  was moderately dilated. There is moderate   left ventricular hypertrophy. Left ventricular diastolic parameters are  consistent with Grade II diastolic dysfunction (pseudonormalization).  Elevated left ventricular end-diastolic pressure. There is the  interventricular septum is flattened in systole  and diastole, consistent with right ventricular pressure and volume  overload.   2. Right ventricular systolic function is mildly reduced. The right  ventricular size is severely enlarged. There is mildly elevated pulmonary  artery systolic pressure. The estimated right ventricular systolic  pressure is Q000111Q mmHg.   3. Left atrial size was moderately dilated.   4. Right atrial size was severely dilated.   5.  The mitral valve is degenerative. Moderate to severe mitral valve  regurgitation.   6. The tricuspid valve is abnormal.  Tricuspid valve regurgitation is  severe.   7. The aortic valve is tricuspid. There is moderate calcification of the  aortic valve. There is mild thickening of the aortic valve. Aortic valve  regurgitation is trivial. Mild aortic valve stenosis. Aortic regurgitation  PHT measures 578 msec. Aortic  valve area, by VTI measures 2.19 cm. Aortic valve mean gradient measures  11.0 mmHg. Aortic valve Vmax measures 2.27 m/s. DI is 0.49, peak gradient  20.6 mmHg.   8. Aortic dilatation noted. There is mild dilatation of the aortic root,  measuring 41 mm. There is borderline dilatation of the ascending aorta,  measuring 39 mm.   9. The inferior vena cava is dilated in size with <50% respiratory  variability, suggesting right atrial pressure of 15 mmHg.  10. Evidence of atrial level shunting detected by color flow Doppler.  There is a moderately sized patent foramen ovale with predominantly left  to right shunting across the atrial septum.   Comparison(s): Changes from prior study are noted. 05/11/2021: LVEF 55-60%,  moderate AS, mild AI, mean gradient 20 mmHg.   Conclusion(s)/Recommendation(s): Consider TEE to further evaluate mitral  and tricuspid regurgitation, suspected PFO.   FINDINGS   Left Ventricle: Left ventricular ejection fraction, by estimation, is 45  to 50%. The left ventricle has mildly decreased function. The left  ventricle demonstrates global hypokinesis. The left ventricular internal  cavity size was moderately dilated.  There is moderate left ventricular hypertrophy. The interventricular  septum is flattened in systole and diastole, consistent with right  ventricular pressure and volume overload. Left ventricular diastolic  parameters are consistent with Grade II diastolic   dysfunction (pseudonormalization). Elevated left ventricular  end-diastolic pressure.   Right Ventricle: The right ventricular size is severely enlarged. No  increase in right ventricular wall  thickness. Right ventricular systolic  function is mildly reduced. There is mildly elevated pulmonary artery  systolic pressure. The tricuspid  regurgitant velocity is 2.68 m/s, and with an assumed right atrial  pressure of 15 mmHg, the estimated right ventricular systolic pressure is  Q000111Q mmHg.   Left Atrium: Left atrial size was moderately dilated.   Right Atrium: Right atrial size was severely dilated.   Pericardium: There is no evidence of pericardial effusion.   Mitral Valve: The mitral valve is degenerative in appearance. There is  moderate thickening of the posterior and anterior mitral valve leaflet(s).  There is moderate calcification of the anterior and posterior mitral valve  leaflet(s). Moderate to severe  mitral valve regurgitation.   Tricuspid Valve: The tricuspid valve is abnormal. Tricuspid valve  regurgitation is severe.   Aortic Valve: The aortic valve is tricuspid. There is moderate  calcification of the aortic valve. There is mild thickening of the aortic  valve. Aortic valve regurgitation is trivial. Aortic regurgitation PHT  measures 578 msec. Mild aortic stenosis is  present. Aortic valve mean gradient measures 11.0 mmHg. Aortic valve peak  gradient measures 20.6 mmHg. Aortic valve area, by VTI measures 2.19 cm.   Pulmonic Valve: The pulmonic valve was grossly normal. Pulmonic valve  regurgitation is mild.   Aorta: Aortic dilatation noted. There is mild dilatation of the aortic  root, measuring 41 mm. There is borderline dilatation of the ascending  aorta, measuring 39 mm.   Venous: The inferior vena cava is dilated in size with less than 50%  respiratory variability, suggesting right  atrial pressure of 15 mmHg.   IAS/Shunts: There is left bowing of the interatrial septum, suggestive of  elevated right atrial pressure. Evidence of atrial level shunting detected  by color flow Doppler. A moderately sized patent foramen ovale is detected  with  predominantly left to  right shunting across the atrial septum.     LEFT VENTRICLE  PLAX 2D  LVIDd:         6.20 cm   Diastology  LVIDs:         4.90 cm   LV e' medial:    4.05 cm/s  LV PW:         1.30 cm   LV E/e' medial:  34.3  LV IVS:        1.40 cm   LV e' lateral:   8.89 cm/s  LVOT diam:     2.40 cm   LV E/e' lateral: 15.6  LV SV:         111  LV SV Index:   49  LVOT Area:     4.52 cm     RIGHT VENTRICLE  RV Basal diam:  3.90 cm  RV S prime:     10.80 cm/s  TAPSE (M-mode): 1.6 cm  RVSP:           43.7 mmHg   LEFT ATRIUM              Index        RIGHT ATRIUM            Index  LA diam:        5.60 cm  2.48 cm/m   RA Pressure: 15.00 mmHg  LA Vol (A2C):   114.0 ml 50.48 ml/m  RA Area:     37.10 cm  LA Vol (A4C):   99.7 ml  44.15 ml/m  RA Volume:   144.00 ml  63.76 ml/m  LA Biplane Vol: 115.0 ml 50.92 ml/m   AORTIC VALVE  AV Area (Vmax):    2.31 cm  AV Area (Vmean):   2.11 cm  AV Area (VTI):     2.19 cm  AV Vmax:           227.00 cm/s  AV Vmean:          153.000 cm/s  AV VTI:            0.505 m  AV Peak Grad:      20.6 mmHg  AV Mean Grad:      11.0 mmHg  LVOT Vmax:         116.00 cm/s  LVOT Vmean:        71.300 cm/s  LVOT VTI:          0.245 m  LVOT/AV VTI ratio: 0.49  AI PHT:            578 msec    AORTA  Ao Root diam: 4.10 cm  Ao Asc diam:  3.75 cm   MITRAL VALVE                  TRICUSPID VALVE  MV Area (PHT): 3.42 cm       TR Peak grad:   28.7 mmHg  MV Decel Time: 222 msec       TR Vmax:        268.00 cm/s  MR Peak grad:    47.0 mmHg    Estimated RAP:  15.00 mmHg  MR Mean grad:    66.5  mmHg    RVSP:           43.7 mmHg  MR Vmax:         342.71 cm/s  MR Vmean:        385.0 cm/s   SHUNTS  MR PISA:         6.28 cm     Systemic VTI:  0.24 m  MR PISA Eff ROA: 69 mm       Systemic Diam: 2.40 cm  MR PISA Radius:  1.00 cm  MV E velocity: 139.00 cm/s  MV A velocity: 95.30 cm/s  MV E/A ratio:  1.46   TEE (08/2022) IMPRESSIONS     1. Severe MV  regurgitation due to a flail A1 segment. Posteriorly  directed jet. 2D ERO 0.26 cm2, R vol 58 cc, 3D VCA 0.50 cm2. Systolic flow  reversal in the LUPV. MG 2 mmHG @ 67 bpm. MVA 4.80 cm2. PMVL length 21 mm.  The mitral valve is degenerative.  Severe mitral valve regurgitation. No evidence of mitral stenosis. The  mean mitral valve gradient is 2.0 mmHg with average heart rate of 67 bpm.   2. Severe, torrential tricuspid regurgitation due to incomplete leaflet  coaptation. 2D ERO 0.55 cm2, R vol 47 cc. The tricuspid valve is abnormal.  Tricuspid valve regurgitation is severe.   3. The aortic valve is tricuspid. There is moderate calcification of the  aortic valve. There is moderate thickening of the aortic valve. Aortic  valve regurgitation is mild. Mild aortic valve stenosis. Aortic valve  area, by VTI measures 2.21 cm. Aortic  valve mean gradient measures 14.6 mmHg. Aortic valve Vmax measures 2.51  m/s.   4. Left ventricular ejection fraction, by estimation, is 45 to 50%. The  left ventricle has mildly decreased function.   5. Right ventricular systolic function is moderately reduced. The right  ventricular size is severely enlarged. There is mildly elevated pulmonary  artery systolic pressure. The estimated right ventricular systolic  pressure is Q000111Q mmHg.   6. Left atrial size was severely dilated. No left atrial/left atrial  appendage thrombus was detected. The LAA emptying velocity was 40 cm/s.   7. Right atrial size was severely dilated.   8. A small pericardial effusion is present. The pericardial effusion is  circumferential.   9. Aortic dilatation noted. Aneurysm of the aortic root, measuring 44 mm.  10. The inferior vena cava is dilated in size with <50% respiratory  variability, suggesting right atrial pressure of 15 mmHg.  11. Agitated saline contrast bubble study was negative, with no evidence  of any interatrial shunt.   FINDINGS   Left Ventricle: Left ventricular  ejection fraction, by estimation, is 45  to 50%. The left ventricle has mildly decreased function. The left  ventricular internal cavity size was normal in size.   Right Ventricle: The right ventricular size is severely enlarged. No  increase in right ventricular wall thickness. Right ventricular systolic  function is moderately reduced. There is mildly elevated pulmonary artery  systolic pressure. The tricuspid  regurgitant velocity is 2.53 m/s, and with an assumed right atrial  pressure of 15 mmHg, the estimated right ventricular systolic pressure is  Q000111Q mmHg.   Left Atrium: Left atrial size was severely dilated. No left atrial/left  atrial appendage thrombus was detected. The LAA emptying velocity was 40  cm/s.   Right Atrium: Right atrial size was severely dilated.   Pericardium: A small pericardial effusion is present. The pericardial  effusion  is circumferential.   Mitral Valve: Severe MV regurgitation due to a flail A1 segment.  Posteriorly directed jet. 2D ERO 0.26 cm2, R vol 58 cc, 3D VCA 0.50 cm2.  Systolic flow reversal in the LUPV. MG 2 mmHG @ 67 bpm. MVA 4.80 cm2. PMVL  length 21 mm. The mitral valve is  degenerative in appearance. There is mild calcification of the anterior  and posterior mitral valve leaflet(s). Severe mitral valve regurgitation.  No evidence of mitral valve stenosis. MV peak gradient, 5.9 mmHg. The mean  mitral valve gradient is 2.0 mmHg   with average heart rate of 67 bpm.   Tricuspid Valve: Severe, torrential tricuspid regurgitation due to  incomplete leaflet coaptation. 2D ERO 0.55 cm2, R vol 47 cc. The tricuspid  valve is abnormal. Tricuspid valve regurgitation is severe. No evidence of  tricuspid stenosis. The flow in the  hepatic veins is reversed during ventricular systole.   Aortic Valve: The aortic valve is tricuspid. There is moderate  calcification of the aortic valve. There is moderate thickening of the  aortic valve. Aortic valve  regurgitation is mild. Mild aortic stenosis is  present. Aortic valve mean gradient measures  14.6 mmHg. Aortic valve peak gradient measures 25.1 mmHg. Aortic valve  area, by VTI measures 2.21 cm.   Pulmonic Valve: The pulmonic valve was grossly normal. Pulmonic valve  regurgitation is trivial. No evidence of pulmonic stenosis.   Aorta: Aortic dilatation noted. 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   Venous: A pattern of systolic flow reversal, suggestive of severe mitral  regurgitation is recorded from the left upper pulmonary vein. The inferior  vena cava is dilated in size with less than 50% respiratory variability,  suggesting right atrial pressure  of 15 mmHg.   IAS/Shunts: There is left bowing of the interatrial septum, suggestive of  elevated right atrial pressure. No atrial level shunt detected by color  flow Doppler. Agitated saline contrast was given intravenously to evaluate  for intracardiac shunting.  Agitated saline contrast bubble study was negative, with no evidence of  any interatrial shunt.     LEFT VENTRICLE  PLAX 2D  LVOT diam:     2.60 cm  LV SV:         124  LV SV Index:   55  LVOT Area:     5.31 cm     AORTIC VALVE  AV Area (Vmax):    2.05 cm  AV Area (Vmean):   2.03 cm  AV Area (VTI):     2.21 cm  AV Vmax:           250.69 cm/s  AV Vmean:          182.642 cm/s  AV VTI:            0.561 m  AV Peak Grad:      25.1 mmHg  AV Mean Grad:      14.6 mmHg  LVOT Vmax:         96.76 cm/s  LVOT Vmean:        69.952 cm/s  LVOT VTI:          0.234 m  LVOT/AV VTI ratio: 0.42    AORTA  Ao Root diam: 4.40 cm  Ao Asc diam:  3.30 cm   MITRAL VALVE                  TRICUSPID VALVE  MV Peak grad: 5.9 mmHg  TR Peak grad:   25.6 mmHg  MV Mean grad: 2.0 mmHg        TR Mean grad:   15.0 mmHg  MV Vmax:      1.21 m/s        TR Vmax:        253.00 cm/s  MV Vmean:     71.7 cm/s       TR Vmean:       185.0 cm/s  MR Peak grad:     145.4 mmHg  MR Mean grad:    96.0 mmHg    SHUNTS  MR Vmax:         603.00 cm/s  Systemic VTI:  0.23 m  MR Vmean:        456.0 cm/s   Systemic Diam: 2.60 cm  MR PISA:         4.02 cm  MR PISA Eff ROA: 26 mm  MR PISA Radius:  0.80 cm    Recent Radiology Findings:  CATH (11/2022) Conclusion      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   Mild calcified plaque in the mid segment of the large dominant RCA No obstructive disease in the Circumflex or LAD Elevated right heart pressures (RA 25, RV 48/8/25, PA 58/30 mean 40, PCWP 28).  AO 137/79, LV 146/15/21, CO 9.8 L/min, CI 4.45   Severe MR and TR by echo.  Moderate aortic stenosis (peak to peak gradient 14.8, mean gradient 14.6)        Recent Lab Findings: Lab Results  Component Value Date   WBC 4.1 11/24/2022   HGB 12.6 (L) 11/24/2022   HCT 37.0 (L) 11/24/2022   PLT 202 11/24/2022   GLUCOSE 74 11/24/2022   CHOL  11/04/2007    83        ATP III CLASSIFICATION:  <200     mg/dL   Desirable  200-239  mg/dL   Borderline High  >=240    mg/dL   High   TRIG 60 11/04/2007   HDL 20 (L) 11/04/2007   LDLCALC  11/04/2007    51        Total Cholesterol/HDL:CHD Risk Coronary Heart Disease Risk Table                     Men   Women  1/2 Average Risk   3.4   3.3   ALT 10 03/16/2020   AST 15 03/16/2020   NA 138 11/24/2022   K 4.9 11/24/2022   CL 96 (L) 11/24/2022   CREATININE 8.05 (H) 11/24/2022   BUN 35 (H) 11/24/2022   CO2 24 11/24/2022   TSH 0.668 ***Test methodology is 3rd generation TSH*** 11/03/2007   INR 1.2 03/15/2020   HGBA1C 5.7 (H) 08/04/2016      Assessment / Plan:        I have spent *** min in review of the records, viewing studies and in face to face with patient and in coordination of future care    Coralie Common 12/11/2022 12:33 PM

## 2022-12-12 ENCOUNTER — Encounter: Payer: Medicare HMO | Admitting: Thoracic Surgery (Cardiothoracic Vascular Surgery)

## 2023-01-02 ENCOUNTER — Institutional Professional Consult (permissible substitution): Payer: Medicare HMO | Admitting: Thoracic Surgery (Cardiothoracic Vascular Surgery)

## 2023-01-02 VITALS — BP 92/53 | HR 74 | Resp 18 | Ht 74.0 in | Wt 212.0 lb

## 2023-01-02 DIAGNOSIS — I340 Nonrheumatic mitral (valve) insufficiency: Secondary | ICD-10-CM

## 2023-01-02 NOTE — Progress Notes (Signed)
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 Pt has had increasing fatigue and when pushing himself having DOE. He is chronic hemodialysis for over 20 years and has not been having issues tolerating dialysis. He however with echo and TEE last year to have severe MR from what appears to be A1-2 flail. 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 He has mild AS with a mean gradient at cath and echo of only 16mHg. He does have moderate PHTN with SPA pressures of 548mg. He was felt that the best course for his multivalvular issues was surgery and pt has been asked to discuss this with cardiac surgery. He has varying Hgb levels reportedly without GI concerns but recently had some hematuria that [facility]    Past Medical History:  Diagnosis Date   ADHD (attention deficit hyperactivity disorder)    Anemia    Anemia, chronic disease 07/13/2011   Anxiety    Arthritis    Bipolar 2 disorder (HCSilver Firs10/09/2011   Diagnosed in 2011.    Bipolar disorder (HCSmithfield   Blood transfusion    Blood transfusion without reported diagnosis    Chronic diastolic CHF (congestive heart failure) (HCC)    Complication of anesthesia    Woke up intubated gets combative  afraid to be alone    Congestive heart failure (CHF) (HCWhite9/03/2011   Onset 2006.  [facility]sie [facility]ancel [facility]  S/p cardiac catheterization in 2014 by Nishan.  Echo 2014.    Depression    Erectile dysfunction    ESRD on hemodialysis (HCRichfield9/03/2011   Glomerulonephritis at age [age]started HD in  1921 [facility].  L forearm AVF . s/p repeat renal transplant 10/2014 [facility].   GI bleed 11/01/2011   Rectal bleeding with emesis/diarrhea    Hemodialysis status (HCEden   Hydrocele, right 11/25/2014   Hypertension    Hypertensive emergency 01/06/2013   Hypoglycemia 01/11/2013   Influenza-like illness 12/18/2013   Mild ascending aorta dilatation (HCC)    NICM (nonischemic cardiomyopathy) (HCC)    Pericardial effusion    a. Mod by echo in 2014, similar to prior.   Peripheral polyneuropathy 08/04/2016   Pulmonary edema 01/06/2013   [facility]ulmonary hypertension associated with [facility] stage renal disease [facility]   Renal transplant, status post 11/25/2014   Pt with Glomerulonephritis at age [age]Deceased donor renal transplant 1999 right and left renal transplant 10/2014.  [facility]; nephrologist is Dr. DaBarbette Merino   Respiratory failure with hypoxia (HCElrod3/12/2012   Shortness of breath 12/12/2012   Swollen testicle 07/13/2011   URI (upper respiratory infection) 01/25/2012    Past Surgical History:  Procedure Laterality Date   ANGIOPLASTY     BUBBLE STUDY  09/05/2022  Procedure: BUBBLE STUDY;  Surgeon: Geralynn Rile, MD;  Location: [place];  Service: Cardiovascular;;   CHOLECYSTECTOMY     DIALYSIS FISTULA CREATION     EVALUATION UNDER ANESTHESIA WITH HEMORRHOIDECTOMY N/A 03/15/2020   Procedure: CONTROL OF ANAL BLEEDING;  Surgeon: Ileana Roup, MD;  Location: [place];  Service: General;  Laterality: N/A;   KIDNEY TRANSPLANT  10/17/14   KNEE SURGERY     LEFT HEART CATHETERIZATION WITH CORONARY ANGIOGRAM N/A 10/01/2013   Procedure: LEFT HEART CATHETERIZATION WITH CORONARY ANGIOGRAM;  Surgeon: [name], MD;  Location: [facility] CATH LAB;  Service: Cardiovascular;  Laterality: N/A;   MOUTH SURGERY     NEPHRECTOMY TRANSPLANTED ORGAN     RIGHT/LEFT HEART CATH AND  CORONARY ANGIOGRAPHY N/A 11/24/2022   Procedure: RIGHT/LEFT HEART CATH AND CORONARY ANGIOGRAPHY;  Surgeon: Burnell Blanks, MD;  Location: [place] CV LAB;  Service: Cardiovascular;  Laterality: N/A;   TEE WITHOUT CARDIOVERSION N/A 09/05/2022   Procedure: TRANSESOPHAGEAL ECHOCARDIOGRAM (TEE);  Surgeon: Geralynn Rile, MD;  Location: [place];  Service: Cardiovascular;  Laterality: N/A;    Social History   Tobacco Use  Smoking Status Former   Types: Cigars  Smokeless Tobacco Never    Social History   Substance and Sexual Activity  Alcohol Use No   Alcohol/week: 1.0 standard drink of alcohol   Types: 1 Standard drinks or equivalent per week   Comment: Stopped drinking after Kidney transplant in December 2015    Social History   Socioeconomic History   Marital status: Single    Spouse name: Not on file   Number of children: 0   Years of education: 20+   Highest education level: Not on file  Occupational History   Occupation: Chef- fulltime  Tobacco Use   Smoking status: Former    Types: Cigars   Smokeless tobacco: Never  Scientific laboratory technician Use: Never used  Substance and Sexual Activity   Alcohol use: No    Alcohol/week: 1.0 standard drink of alcohol    Types: 1 Standard drinks or equivalent per week    Comment: Stopped drinking after Kidney transplant in December 2015   Drug use: No   Sexual activity: Yes  Other Topics Concern   Not on file  Social History Narrative   Marital status: single; dating      Children; none      Lives: alone      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      Tobacco: none      Alcohol: none      Drugs: none      Exercise: cycling; 3 days per week.      Sexual activity: females only; no male encounters; Chlamydia age 62.  Total partner = 55.  Condoms.      Caffeine use: Soda sometimes   Social Determinants of Radio broadcast assistant Strain: Not on file   Food Insecurity: Not on file  Transportation Needs: Not on file  Physical Activity: Not on file  Stress: Not on file  Social Connections: Not on file  Intimate Partner Violence: Not on file    Allergies  Allergen Reactions   Delacort [Hydrocortisone Base] Nausea Only   Benicar [Olmesartan]     Unknown   Diltiazem Other (See Comments)    Tachycardia   Food Itching    bananas   Hydralazine Other (See Comments)    Tachycardia  Nitrates, Organic Other (See Comments)    Palpitations, headache   Psyllium Other (See Comments)    Unknown    Current Outpatient Medications  Medication Sig Dispense Refill   acetaminophen (TYLENOL) 500 MG tablet Take 1,000 mg by mouth daily as needed for moderate pain.     acyclovir (ZOVIRAX) 200 MG capsule Take 200 mg by mouth 2 (two) times daily.     amLODipine (NORVASC) 2.5 MG tablet Take 1 tablet (2.5 mg total) by mouth daily. (Patient taking differently: Take 2.5 mg by mouth See admin instructions. Take 2.5 mg on Tuesday and Thursday, Saturday and Sunday once a day in the morning) 90 tablet 3   amphetamine-dextroamphetamine (ADDERALL XR) 20 MG 24 hr capsule Take 20 mg by mouth every morning.     amphetamine-dextroamphetamine (ADDERALL) 10 MG tablet Take 10 mg by mouth every evening.     bismuth subsalicylate (PEPTO BISMOL) 262 MG chewable tablet Chew 524 mg by mouth as needed for indigestion.     calcium carbonate (TUMS EX) 750 MG chewable tablet Chew 1,500 mg by mouth See admin instructions. Take 1500 mg twice daily with snacks     carvedilol (COREG) 12.5 MG tablet Take 12.5 mg by mouth See admin instructions. Take 12.5 mg on Monday,Wednesday and Friday once a day in the morning     carvedilol (COREG) 25 MG tablet Take 1 tablet (25 mg total) by mouth 2 times daily with meals. (Patient taking differently: Take 25 mg by mouth See admin instructions. Take 25 mg twice a day on Tuesday,Thursday, Saturday and Sunday) 180 tablet 2   clobetasol ointment  (TEMOVATE) [id number] % Apply 1 Application topically 2 (two) times daily as needed (rash).     clonazePAM (KLONOPIN) 0.5 MG tablet Take 1 mg by mouth See admin instructions. Take 1 mg Monday, Wednesday and Friday in the evening     HYDROcodone-acetaminophen (NORCO) 7.5-325 MG tablet Take 1 tablet by mouth 3 (three) times daily as needed for severe pain or moderate pain.     lidocaine-prilocaine (EMLA) cream Apply 1 Application topically every Monday, Wednesday, and Friday. One hour prior to Dialysis     sevelamer carbonate (RENVELA) 0.8 g PACK packet Take 0.8-1.6 g by mouth 3 (three) times daily with meals.     No current facility-administered medications for this visit.     Family History  Problem Relation Age of Onset   Arthritis Mother    Hypertension Mother    Diabetes Mother    Depression Mother    Arthritis Father    Hypertension Father    Diabetes Father    Depression Father        Physical Exam: Neuro: alert and no focal deficits Teeth in good repair Lungs: clear Cardiac: RR with 2/6 sem to axilla Ext: no edema     Diagnostic Studies & Laboratory data: I have personally reviewed the following studies and agree with the findings   TEE (08/2022) IMPRESSIONS     1. Severe MV regurgitation due to a flail A1 segment. Posteriorly  directed jet. 2D ERO 0.26 cm2, R vol 58 cc, 3D VCA 0.50 cm2. Systolic flow  reversal in the LUPV. MG 2 mmHG @ 67 bpm. MVA 4.80 cm2. PMVL length 21 mm.  The mitral valve is degenerative.  Severe mitral valve regurgitation. No evidence of mitral stenosis. The  mean mitral valve gradient is 2.0 mmHg with average heart rate of 67 bpm.   2. Severe, torrential tricuspid regurgitation due to incomplete leaflet  coaptation. 2D ERO 0.55 cm2, R vol 47 cc. The tricuspid valve is abnormal.  Tricuspid valve regurgitation is severe.   3. The aortic valve is tricuspid. There is moderate calcification of the  aortic valve. There is moderate thickening of the  aortic valve. Aortic  valve regurgitation is mild. Mild aortic valve stenosis. Aortic valve  area, by VTI measures 2.21 cm. Aortic  valve mean gradient measures 14.6 mmHg. Aortic valve Vmax measures 2.51  m/s.   4. Left ventricular ejection fraction, by estimation, is 45 to 50%. The  left ventricle has mildly decreased function.   5. Right ventricular systolic function is moderately reduced. The right  ventricular size is severely enlarged. There is mildly elevated pulmonary  artery systolic pressure. The estimated right ventricular systolic  pressure is Q000111Q mmHg.   6. Left atrial size was severely dilated. No left atrial/left atrial  appendage thrombus was detected. The LAA emptying velocity was 40 cm/s.   7. Right atrial size was severely dilated.   8. A small pericardial effusion is present. The pericardial effusion is  circumferential.   9. Aortic dilatation noted. Aneurysm of the aortic root, measuring 44 mm.  10. The inferior vena cava is dilated in size with <50% respiratory  variability, suggesting right atrial pressure of 15 mmHg.  11. Agitated saline contrast bubble study was negative, with no evidence  of any interatrial shunt.   FINDINGS   Left Ventricle: Left ventricular ejection fraction, by estimation, is 45  to 50%. The left ventricle has mildly decreased function. The left  ventricular internal cavity size was normal in size.   Right Ventricle: The right ventricular size is severely enlarged. No  increase in right ventricular wall thickness. Right ventricular systolic  function is moderately reduced. There is mildly elevated pulmonary artery  systolic pressure. The tricuspid  regurgitant velocity is 2.53 m/s, and with an assumed right atrial  pressure of 15 mmHg, the estimated right ventricular systolic pressure is  Q000111Q mmHg.   Left Atrium: Left atrial size was severely dilated. No left atrial/left  atrial appendage thrombus was detected. The LAA emptying  velocity was 40  cm/s.   Right Atrium: Right atrial size was severely dilated.   Pericardium: A small pericardial effusion is present. The pericardial  effusion is circumferential.   Mitral Valve: Severe MV regurgitation due to a flail A1 segment.  Posteriorly directed jet. 2D ERO 0.26 cm2, R vol 58 cc, 3D VCA 0.50 cm2.  Systolic flow reversal in the LUPV. MG 2 mmHG @ 67 bpm. MVA 4.80 cm2. PMVL  length 21 mm. The mitral valve is  degenerative in appearance. There is mild calcification of the anterior  and posterior mitral valve leaflet(s). Severe mitral valve regurgitation.  No evidence of mitral valve stenosis. MV peak gradient, 5.9 mmHg. The mean  mitral valve gradient is 2.0 mmHg   with average heart rate of 67 bpm.   Tricuspid Valve: Severe, torrential tricuspid regurgitation due to  incomplete leaflet coaptation. 2D ERO 0.55 cm2, R vol 47 cc. The tricuspid  valve is abnormal. Tricuspid valve regurgitation is severe. No evidence of  tricuspid stenosis. The flow in the  hepatic veins is reversed during ventricular systole.   Aortic Valve: The aortic valve is tricuspid. There is moderate  calcification of the aortic valve. There is moderate thickening of the  aortic valve. Aortic valve regurgitation is mild. Mild aortic stenosis is  present. Aortic valve mean gradient measures  14.6 mmHg. Aortic valve peak gradient  measures 25.1 mmHg. Aortic valve  area, by VTI measures 2.21 cm.   Pulmonic Valve: The pulmonic valve was grossly normal. Pulmonic valve  regurgitation is trivial. No evidence of pulmonic stenosis.   Aorta: Aortic dilatation noted. 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   Venous: A pattern of systolic flow reversal, suggestive of severe mitral  regurgitation is recorded from the left upper pulmonary vein. The inferior  vena cava is dilated in size with less than 50% respiratory variability,  suggesting right atrial pressure  of 15 mmHg.    IAS/Shunts: There is left bowing of the interatrial septum, suggestive of  elevated right atrial pressure. No atrial level shunt detected by color  flow Doppler. Agitated saline contrast was given intravenously to evaluate  for intracardiac shunting.  Agitated saline contrast bubble study was negative, with no evidence of  any interatrial shunt.     LEFT VENTRICLE  PLAX 2D  LVOT diam:     2.60 cm  LV SV:         124  LV SV Index:   55  LVOT Area:     5.31 cm     AORTIC VALVE  AV Area (Vmax):    2.05 cm  AV Area (Vmean):   2.03 cm  AV Area (VTI):     2.21 cm  AV Vmax:           250.69 cm/s  AV Vmean:          182.642 cm/s  AV VTI:            0.561 m  AV Peak Grad:      25.1 mmHg  AV Mean Grad:      14.6 mmHg  LVOT Vmax:         96.76 cm/s  LVOT Vmean:        69.952 cm/s  LVOT VTI:          0.234 m  LVOT/AV VTI ratio: 0.42    AORTA  Ao Root diam: 4.40 cm  Ao Asc diam:  3.30 cm   MITRAL VALVE                  TRICUSPID VALVE  MV Peak grad: 5.9 mmHg        TR Peak grad:   25.6 mmHg  MV Mean grad: 2.0 mmHg        TR Mean grad:   15.0 mmHg  MV Vmax:      1.21 m/s        TR Vmax:        253.00 cm/s  MV Vmean:     71.7 cm/s       TR Vmean:       185.0 cm/s  MR Peak grad:    145.4 mmHg  MR Mean grad:    96.0 mmHg    SHUNTS  MR Vmax:         603.00 cm/s  Systemic VTI:  0.23 m  MR Vmean:        456.0 cm/s   Systemic Diam: 2.60 cm  MR PISA:         4.02 cm  MR PISA Eff ROA: 26 mm  MR PISA Radius:  0.80 cm   CATH (11/2022) Conclusion      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   Mild calcified plaque in the mid segment of the large dominant RCA No obstructive disease in the Circumflex  or LAD Elevated right heart pressures (RA 25, RV 48/8/25, PA 58/30 mean 40, PCWP 28).  AO 137/79, LV 146/15/21, CO 9.8 L/min, CI 4.45   Severe MR and TR by echo.  Moderate aortic stenosis (peak to peak gradient 14.8, mean gradient 14.6)    Recent Radiology Findings:       Recent  Lab Findings:     Assessment / Plan:     52 yo male with chronic renal failure on dialysis who has severe MR and TR and mild AS with NYHA class 1-2 symptoms with mildly reduced LV function and moderate PHTN. We discussed his surgical issues that I feel would include Mitral valve repair with a 90% success rate with the flail etiology and TV repair with annuloplasty. I dont feel his AS warrants being addressed since that would require Mechanical valve replacement and at this point his MV/TV would not require life long anticoagulation if able to be repaired. He understands his increased risk for surgery with his renal failure and I have quoted him a 2% surgical mortality. He understands the morbidity of CVA, arrhythmias, infection and reduced LV function with surgery also and he understands the recovery needed with surgery.  He is in agreement with this and however would like to postpone surgery till after summer. We discussed that with his current state this is possible as long as he monitors his symptoms closely and will be seeing Dr Marisue Ivan next month to get his opinion on surgical timing. He could have a repeat echo soon to confirm no change in LV function/PHTN to help guide timing of surgery  Will await his decision on when to proceed with surgery and coordinate his dialysis to be the day before.     I have spent 65 min in review of the records, viewing studies and in face to face with patient and in coordination of future care    Coralie Common 01/02/2023 8:47 AM

## 2023-01-02 NOTE — Patient Instructions (Signed)
Discuss hematuria with your Urologist Dr Marisue Ivan to discuss timing of surgery with you Coordinate dialysis day prior to surgery

## 2023-01-23 NOTE — Progress Notes (Signed)
Cardiology Office Note:   Date:  01/27/2023  NAME:  [name]    MRN: [id number] DOB:  11-02-1971   PCP:  Joesph July, MD  Cardiologist:  None  Electrophysiologist:  None   Referring MD: Joesph July, MD   Chief Complaint  Patient presents with   Follow-up    6 months.    History of Present Illness:   [name] is a 52 y.o. male with below history who presents for follow-up.  He reports he is doing well since left and right heart catheterization.  Missed dialysis on Wednesday.  Significant volume up today.  He had dialysis today.  Denies any chest pain or trouble breathing.  Noticeable murmur on examination.  He met with cardiac surgery.  Plans to wait until the summer for surgery.  I am okay for this.  He should watch his dialysis.  No significant CAD on heart cath.  Cholesterol level at goal.  Problem List  1. 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 HTN 3. Perianal condyloma  4. Moderate aortic stenosis -V max 3.1 m/s, MG 21 mmHG, AVA 1.5 cm2 05/2020 -Vmax 3.0 m/s, MG 20 mmHG 5. Chronic anemia  6. HLD -T chol 67, HDL 28, LDL 24, TG 83 7. Systolic HF, [id number] 8. Severe pulmonary hypertension  -mPAP 40 mmHG -PCWP 28 mmHG -TPG 13 mmHG, 2.7 WU 9. Severe mitral valve regurgitation  -flail A1 segment 10. Severe tricuspid regurgitation  11. OSA -only wears O2 at night/cannot tolerate CPAP 12. Non-obstructive CAD -30% RCA 11/2022  Past Medical History: Past Medical History:  Diagnosis Date   ADHD (attention deficit hyperactivity disorder)    Anemia    Anemia, chronic disease 07/13/2011   Anxiety    Arthritis    Bipolar 2 disorder ([place]) 08/18/2011   Diagnosed in 2011.    Bipolar disorder ([place])    Blood transfusion    Blood transfusion without reported diagnosis    Chronic diastolic CHF (congestive heart failure) (HCC)    Complication of anesthesia    Woke up intubated gets combative  afraid to  be alone    Congestive heart failure (CHF) ([place]) 07/13/2011   Onset 2006.  Followed by Dr. Johnsie Cancel.  S/p cardiac catheterization in [facility] Dr. Marlou Porch..  S/p cardiac catheterization in 2014 by Nishan.  Echo 2014.    Depression    Erectile dysfunction    ESRD on hemodialysis ([place]) 07/13/2011   Glomerulonephritis at age 70, started HD in 89.  Deceased donor renal transplant 1999 at [facility], then kidney failed and went back on HD in 2006.  L forearm AVF . s/p repeat renal transplant 10/2014 [facility].   GI bleed 11/01/2011   Rectal bleeding with emesis/diarrhea    Hemodialysis status ([place])    Hydrocele, right 11/25/2014   Hypertension    Hypertensive emergency 01/06/2013   Hypoglycemia 01/11/2013   Influenza-like illness 12/18/2013   Mild ascending aorta dilatation (HCC)    NICM (nonischemic cardiomyopathy) (HCC)    Pericardial effusion    a. Mod by echo in 2014, similar to prior.   Peripheral polyneuropathy 08/04/2016   Pulmonary edema 01/06/2013   Pulmonary hypertension associated with end stage renal disease on dialysis [facility]) 07/13/2011   Renal transplant, status post 11/25/2014   Pt with Glomerulonephritis at age 8. Deceased donor renal transplant 1999 right and left renal transplant 10/2014.  Followed by [facility] transplant team; nephrologist is Dr. Barbette Merino.  Respiratory failure with hypoxia ([place]) 01/06/2013   Shortness of breath 12/12/2012   Swollen testicle 07/13/2011   URI (upper respiratory infection) 01/25/2012    Past Surgical History: Past Surgical History:  Procedure Laterality Date   ANGIOPLASTY     BUBBLE STUDY  09/05/2022   Procedure: BUBBLE STUDY;  Surgeon: Geralynn Rile, MD;  Location: [place];  Service: Cardiovascular;;   CHOLECYSTECTOMY     DIALYSIS FISTULA CREATION     EVALUATION UNDER ANESTHESIA WITH HEMORRHOIDECTOMY N/A 03/15/2020   Procedure: CONTROL OF ANAL BLEEDING;  Surgeon: Ileana Roup, MD;  Location: [place];  Service: General;  Laterality: N/A;    KIDNEY TRANSPLANT  10/17/14   KNEE SURGERY     LEFT HEART CATHETERIZATION WITH CORONARY ANGIOGRAM N/A 10/01/2013   Procedure: LEFT HEART CATHETERIZATION WITH CORONARY ANGIOGRAM;  Surgeon: [name], MD;  Location: [facility] CATH LAB;  Service: Cardiovascular;  Laterality: N/A;   MOUTH SURGERY     NEPHRECTOMY TRANSPLANTED ORGAN     RIGHT/LEFT HEART CATH AND CORONARY ANGIOGRAPHY N/A 11/24/2022   Procedure: RIGHT/LEFT HEART CATH AND CORONARY ANGIOGRAPHY;  Surgeon: Burnell Blanks, MD;  Location: [place] CV LAB;  Service: Cardiovascular;  Laterality: N/A;   TEE WITHOUT CARDIOVERSION N/A 09/05/2022   Procedure: TRANSESOPHAGEAL ECHOCARDIOGRAM (TEE);  Surgeon: Geralynn Rile, MD;  Location: [place];  Service: Cardiovascular;  Laterality: N/A;    Current Medications: Current Meds  Medication Sig   acetaminophen (TYLENOL) 500 MG tablet Take 1,000 mg by mouth daily as needed for moderate pain.   acyclovir (ZOVIRAX) 200 MG capsule Take 200 mg by mouth 2 (two) times daily.   amLODipine (NORVASC) 2.5 MG tablet Take 1 tablet (2.5 mg total) by mouth daily. (Patient taking differently: Take 2.5 mg by mouth See admin instructions. Take 2.5 mg on Tuesday and Thursday, Saturday and Sunday once a day in the morning)   amphetamine-dextroamphetamine (ADDERALL XR) 20 MG 24 hr capsule Take 20 mg by mouth every morning.   amphetamine-dextroamphetamine (ADDERALL) 10 MG tablet Take 10 mg by mouth every evening.   atorvastatin (LIPITOR) 10 MG tablet Take 1 tablet (10 mg total) by mouth daily.   bismuth subsalicylate (PEPTO BISMOL) 262 MG chewable tablet Chew 524 mg by mouth as needed for indigestion.   calcium carbonate (TUMS EX) 750 MG chewable tablet Chew 1,500 mg by mouth See admin instructions. Take 1500 mg twice daily with snacks   carvedilol (COREG) 12.5 MG tablet Take 12.5 mg by mouth See admin instructions. Take 12.5 mg on Monday,Wednesday and Friday once a day in the morning   carvedilol  (COREG) 25 MG tablet Take 1 tablet (25 mg total) by mouth 2 times daily with meals. (Patient taking differently: Take 25 mg by mouth See admin instructions. Take 25 mg twice a day on Tuesday,Thursday, Saturday and Sunday)   clobetasol ointment (TEMOVATE) [id number] % Apply 1 Application topically 2 (two) times daily as needed (rash).   clonazePAM (KLONOPIN) 0.5 MG tablet Take 1 mg by mouth See admin instructions. Take 1 mg Monday, Wednesday and Friday in the evening   HYDROcodone-acetaminophen (NORCO) 7.5-325 MG tablet Take 1 tablet by mouth 3 (three) times daily as needed for severe pain or moderate pain.   lidocaine-prilocaine (EMLA) cream Apply 1 Application topically every Monday, Wednesday, and Friday. One hour prior to Dialysis   sevelamer carbonate (RENVELA) 0.8 g PACK packet Take 0.8-1.6 g by mouth 3 (three) times daily with meals.     Allergies:  Delacort [hydrocortisone base]; Benicar [olmesartan]; Diltiazem; Food; Hydralazine; Nitrates, organic; and Psyllium   Social History: Social History   Socioeconomic History   Marital status: Single    Spouse name: Not on file   Number of children: 0   Years of education: 20+   Highest education level: Not on file  Occupational History   Occupation: Chef- fulltime  Tobacco Use   Smoking status: Former    Types: Cigars   Smokeless tobacco: Never  Scientific laboratory technician Use: Never used  Substance and Sexual Activity   Alcohol use: No    Alcohol/week: 1.0 standard drink of alcohol    Types: 1 Standard drinks or equivalent per week    Comment: Stopped drinking after Kidney transplant in December 2015   Drug use: No   Sexual activity: Yes  Other Topics Concern   Not on file  Social History Narrative   Marital status: single; dating      Children; none      Lives: alone      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      Tobacco: none      Alcohol: none      Drugs: none       Exercise: cycling; 3 days per week.      Sexual activity: females only; no male encounters; Chlamydia age 62.  Total partner = 55.  Condoms.      Caffeine use: Soda sometimes   Social Determinants of Radio broadcast assistant Strain: Not on file  Food Insecurity: Not on file  Transportation Needs: Not on file  Physical Activity: Not on file  Stress: Not on file  Social Connections: Not on file     Family History: The patient's family history includes Arthritis in his father and mother; Depression in his father and mother; Diabetes in his father and mother; Hypertension in his father and mother.  ROS:   All other ROS reviewed and negative. Pertinent positives noted in the HPI.     EKGs/Labs/Other Studies Reviewed:   The following studies were personally reviewed by me today:  EKG:  EKG is ordered today.  The ekg ordered today demonstrates normal sinus rhythm heart rate 74, first-degree AV block, nonspecific ST-T changes, LVH with likely repolarization, and was personally reviewed by me.   Recent Labs: 11/24/2022: BUN 35; Creatinine, Ser 8.05; Hemoglobin 12.6; Platelets 202; Potassium 4.9; Sodium 138   Recent Lipid Panel    Component Value Date/Time   CHOL  11/04/2007 0424    83        ATP III CLASSIFICATION:  <200     mg/dL   Desirable  200-239  mg/dL   Borderline High  >=240    mg/dL   High   TRIG 60 11/04/2007 0424   HDL 20 (L) 11/04/2007 0424   CHOLHDL 4.2 11/04/2007 0424   VLDL 12 11/04/2007 0424   LDLCALC  11/04/2007 0424    51        Total Cholesterol/HDL:CHD Risk Coronary Heart Disease Risk Table                     Men   Women  1/2 Average Risk   3.4   3.3    Physical Exam:   VS:  BP (!) 138/48 (BP Location: Left Leg, Patient Position: Sitting, Cuff Size: Large)   Pulse 74   Ht 6' (1.829 m)  Wt 227 lb (103 kg)   BMI 30.79 kg/m    Wt Readings from Last 3 Encounters:  01/27/23 227 lb (103 kg)  01/02/23 212 lb (96.2 kg)  11/24/22 210 lb (95.3 kg)     General: Well nourished, well developed, in no acute distress Head: Atraumatic, normal size  Eyes: PEERLA, EOMI  Neck: Supple, JVD 12 to 15 cm of water, V wave Endocrine: No thryomegaly Cardiac: Normal S1, S2; RRR; 3 out of 6 holosystolic murmur Lungs: Clear to auscultation bilaterally, no wheezing, rhonchi or rales  Abd: Soft, nontender, no hepatomegaly  Ext: 2+ pitting edema Musculoskeletal: No deformities, BUE and BLE strength normal and equal Skin: Warm and dry, no rashes   Neuro: Alert and oriented to person, place, time, and situation, CNII-XII grossly intact, no focal deficits  Psych: Normal mood and affect   ASSESSMENT:   [name] is a 52 y.o. male who presents for the following: 1. Nonrheumatic mitral valve regurgitation   2. Nonrheumatic tricuspid valve regurgitation   3. Nonrheumatic aortic valve stenosis   4. Pulmonary hypertension, unspecified ([place])   5. Chronic systolic heart failure ([place])   6. OSA (obstructive sleep apnea)   7. Coronary artery disease involving native coronary artery of native heart without angina pectoris   8. Mixed hyperlipidemia     PLAN:   1. Nonrheumatic mitral valve regurgitation 2. Nonrheumatic tricuspid valve regurgitation 3. Nonrheumatic aortic valve stenosis 4. Pulmonary hypertension, unspecified ([place]) 5. Chronic systolic heart failure (HCC) -Severe mitral valve regurgitation.  Moderate aortic stenosis.  Severe tricuspid regurgitation.  He has evidence of moderate pulmonary hypertension with cor pulmonale.  He is on his CPAP machine.  He has elevated left-sided filling pressures.  Overall believe his pulm hypertension is secondary to his mitral valve regurgitation and likely untreated sleep apnea.  Asked him to work on using his CPAP machine.  He intermittently will need mitral valve repair, tricuspid valve repair and aortic valve replacement.  Timing of surgery will likely be done later this summer.  He is volume overloaded today.  He had  missed dialysis on Wednesday.  I have encouraged him to not miss dialysis.  Apparently had a tooth issue that required surgery.  He will look out for worsening signs of heart failure.  His EF is 45 to 50%.  This is all related to severe valvular heart disease.  Should he not be able to make it to this, he would need surgery earlier.  We discussed that worsening heart failure and inability to tolerate dialysis may be an issue.  For now he reports financial situation precludes him from having surgery.  I am okay to continue with current regimen which is largely supportive given his ESRD status.  Will see how he does.  He will see me in close follow-up every 3 months until surgery. -No change in heart failure medications.  Currently on appropriate therapy.  Cannot be on ACE/ARB/ARNI/MRA given ESRD.  6. OSA (obstructive sleep apnea) -Encouraged to use CPAP machine.  7. Coronary artery disease involving native coronary artery of native heart without angina pectoris 8. Mixed hyperlipidemia -Nonobstructive CAD.  Add Lipitor 10 mg daily.  No need for aspirin at this time.  He has minimal nonobstructive CAD.  Disposition: Return in about 3 months (around 04/29/2023).  Medication Adjustments/Labs and Tests Ordered: Current medicines are reviewed at length with the patient today.  Concerns regarding medicines are outlined above.  Orders Placed This Encounter  Procedures   EKG 12-Lead  Meds ordered this encounter  Medications   atorvastatin (LIPITOR) 10 MG tablet    Sig: Take 1 tablet (10 mg total) by mouth daily.    Dispense:  90 tablet    Refill:  3    Patient Instructions  Medication Instructions:  START Lipitor 10 mg daily   *If you need a refill on your cardiac medications before your next appointment, please call your pharmacy*   Follow-Up: At [facility], you and your health needs are our priority.  As part of our continuing mission to provide you with exceptional heart care,  we have created designated Provider Care Teams.  These Care Teams include your primary Cardiologist (physician) and Advanced Practice Providers (APPs -  Physician Assistants and Nurse Practitioners) who all work together to provide you with the care you need, when you need it.  We recommend signing up for the patient portal called "MyChart".  Sign up information is provided on this After Visit Summary.  MyChart is used to connect with patients for Virtual Visits (Telemedicine).  Patients are able to view lab/test results, encounter notes, upcoming appointments, etc.  Non-urgent messages can be sent to your provider as well.   To learn more about what you can do with MyChart, go to [email or web address].    Your next appointment:   3 month(s)  Provider:   Eleonore Chiquito, MD      Time Spent with Patient: I have spent a total of 35 minutes with patient reviewing hospital notes, telemetry, EKGs, labs and examining the patient as well as establishing an assessment and plan that was discussed with the patient.  > 50% of time was spent in direct patient care.  Signed, Addison Naegeli. Audie Box, MD, [place]  [address], [place], [place] [address] [phone]  01/27/2023 9:10 AM

## 2023-01-24 ENCOUNTER — Ambulatory Visit (HOSPITAL_COMMUNITY): Payer: Medicare HMO

## 2023-01-27 ENCOUNTER — Encounter: Payer: Self-pay | Admitting: Cardiovascular Disease

## 2023-01-27 ENCOUNTER — Ambulatory Visit: Payer: Medicare HMO | Attending: Cardiovascular Disease | Admitting: Cardiovascular Disease

## 2023-01-27 VITALS — BP 138/48 | HR 74 | Ht 72.0 in | Wt 227.0 lb

## 2023-01-27 DIAGNOSIS — I350 Nonrheumatic aortic (valve) stenosis: Secondary | ICD-10-CM

## 2023-01-27 DIAGNOSIS — I272 Pulmonary hypertension, unspecified: Secondary | ICD-10-CM | POA: Diagnosis not present

## 2023-01-27 DIAGNOSIS — I340 Nonrheumatic mitral (valve) insufficiency: Secondary | ICD-10-CM

## 2023-01-27 DIAGNOSIS — I361 Nonrheumatic tricuspid (valve) insufficiency: Secondary | ICD-10-CM

## 2023-01-27 DIAGNOSIS — G4733 Obstructive sleep apnea (adult) (pediatric): Secondary | ICD-10-CM

## 2023-01-27 DIAGNOSIS — I2510 Atherosclerotic heart disease of native coronary artery without angina pectoris: Secondary | ICD-10-CM

## 2023-01-27 DIAGNOSIS — I5022 Chronic systolic (congestive) heart failure: Secondary | ICD-10-CM

## 2023-01-27 DIAGNOSIS — E782 Mixed hyperlipidemia: Secondary | ICD-10-CM

## 2023-01-27 MED ORDER — ATORVASTATIN CALCIUM 10 MG PO TABS: 10.0000 mg | ORAL_TABLET | Freq: Every day | ORAL | 3 refills | Status: DC

## 2023-01-27 NOTE — Patient Instructions (Addendum)
Medication Instructions:  START Lipitor 10 mg daily   *If you need a refill on your cardiac medications before your next appointment, please call your pharmacy*   Follow-Up: At [facility], you and your health needs are our priority.  As part of our continuing mission to provide you with exceptional heart care, we have created designated Provider Care Teams.  These Care Teams include your primary Cardiologist (physician) and Advanced Practice Providers (APPs -  Physician Assistants and Nurse Practitioners) who all work together to provide you with the care you need, when you need it.  We recommend signing up for the patient portal called "MyChart".  Sign up information is provided on this After Visit Summary.  MyChart is used to connect with patients for Virtual Visits (Telemedicine).  Patients are able to view lab/test results, encounter notes, upcoming appointments, etc.  Non-urgent messages can be sent to your provider as well.   To learn more about what you can do with MyChart, go to [email or web address].    Your next appointment:   3 month(s)  Provider:   Eleonore Chiquito, MD

## 2023-02-08 ENCOUNTER — Telehealth: Payer: Self-pay | Admitting: Cardiovascular Disease

## 2023-02-08 NOTE — Telephone Encounter (Signed)
   Patient Name: [name]  DOB: 1971-09-08 MRN: [id number]  Primary Cardiologist: None  Chart reviewed as part of pre-operative protocol coverage. Given past medical history and time since last visit, based on ACC/AHA guidelines, Per Dr. David Stall [facility] is at acceptable risk for the planned procedure without further cardiovascular testing.    I will route this recommendation to the requesting party via Epic fax function and remove from pre-op pool.  Please call with questions.  Mable Fill, Marissa Nestle, NP 02/08/2023, 11:51 AM

## 2023-02-08 NOTE — Telephone Encounter (Signed)
   Pre-operative Risk Assessment    Patient Name: [name]  DOB: 1971-02-03 MRN: [id number]      Request for Surgical Clearance    Procedure:   Prosthesis removal    Date of Surgery:  Clearance 02/14/23                                 Surgeon:  Dr. Odis Luster and Dr. Natasha Mead Surgeon's Group or Practice Name:  [facility] Phone number:  [phone] Fax number:  [phone]   Type of Clearance Requested:   - Medical    Type of Anesthesia:  MAC   Additional requests/questions:      SignedMilbert Coulter   02/08/2023, 10:16 AM

## 2023-02-08 NOTE — Telephone Encounter (Signed)
Good Morning,  We received a surgical clearance request for removal of penile prosthesis for [name].  He has a past medical history of moderate AAS, severe PHT, severe TR, OSA, nonobstructive CAD and he was seen by you in clinic on 01/27/2023.  Based on your recent visit would you be at acceptable risk to proceed with his scheduled procedure?  Please forward your recommendations to P CV DIV PREOP.  Thank you, Ambrose Pancoast, NP

## 2023-02-23 ENCOUNTER — Telehealth: Payer: Self-pay | Admitting: Cardiovascular Disease

## 2023-02-23 NOTE — Telephone Encounter (Signed)
Pt c/o swelling: STAT is pt has developed SOB within 24 hours  If swelling, where is the swelling located?  Legs  How much weight have you gained and in what time span? NA  Have you gained 3 pounds in a day or 5 pounds in a week? No  Do you have a log of your daily weights (if so, list)? No  Are you currently taking a fluid pill? No  Are you currently SOB? No  Have you traveled recently? No  Pt states that he did have B;adder surgery last week but does not know if that has anything to do with the swelling.   Pt c/o medication issue:  1. Name of Medication:   atorvastatin (LIPITOR) 10 MG tablet    2. How are you currently taking this medication (dosage and times per day)? Take 1 tablet (10 mg total) by mouth daily.   3. Are you having a reaction (difficulty breathing--STAT)? No  4. What is your medication issue?  Pt states that medication is causing him to be dizzy. Please advise

## 2023-02-23 NOTE — Telephone Encounter (Signed)
Patient calling . Patient states he is calling to update Dr Val EagleJennette Kettle.  Patient states Dr Val EagleJennette Kettle always ask him if he has any swelling.   Patient states he had 2 surgeries last week one 02/14/23 and 02/15/23.  He states he did receive 2 unit of blood  because hgb dropped down to 8.  He goes to dialysis. They are in the process of  reestablish his dry weight. They are challenge with 1 kg to 2 kg to lower his daily weight.   Patient states he has swelling for thigh to feet bilaterally ,but  it goes down some per patient.  Patient states  no shortness of breath, no chest pain, no increase heart rate , no elevated blood pressure.  Patient states dry weight 2 days ago 96 kg  yesterday 98 kg.    He also states  he becomes dizzy when he takes Atorvastatin - so is going to stop taking.  RN informed patient to try and re-challenge taking atorvastatin  -and  take it at bedtime instead. (Atorvastatin  usually does not cause dizziness - it is not for blood pressure )

## 2023-02-27 NOTE — Telephone Encounter (Signed)
Called patient, advised that we would him in to be seen in a few weeks, scheduled for upcoming visit with Dr.O'Neal on the day he would be able to come with transportation.   Patient scheduled, verbalized understanding,.

## 2023-03-15 NOTE — H&P (View-Only) (Signed)
Cardiology Office Note:   Date:  03/16/2023  NAME:  [name]    MRN: [id number] DOB:  02/24/1971   PCP:  Hoey, Holly R, MD  Cardiologist:  None  Electrophysiologist:  None   Referring MD: Hoey, Holly R, MD   Chief Complaint  Patient presents with   Follow-up        History of Present Illness:   [name] is a 52 y.o. male with a hx of severe MR, moderate aortic stenosis, severe tricuspid regurgitation, ESRD who presents for follow-up. Also with severe pHTN.  He presents for follow-up.  Recently had a urological infection.  Had to undergo surgery.  Reports infection in his scrotum and bladder.  Required surgery.  Recovering numbness.  EKG shows atrial flutter.  He is feeling his heart racing.  Feeling this daily.  He did require transfusion recently.  He has chronic anemia.  Hemoglobin seems to be stable.  He reports no chest pain.  Has 2+ pitting edema.  We discussed that he should not delay his cardiac surgery.  He needs to get this done after cardioversion.  We discussed starting Eliquis and pursuing a cardioversion in the next 3 weeks.  He is willing to do this.  CV exam unchanged.  Appears volume overloaded but no major complaints.  Problem List  1. 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 HTN 3. Perianal condyloma  4. Moderate aortic stenosis -V max 3.1 m/s, MG 21 mmHG, AVA 1.5 cm2 05/2020 -Vmax 3.0 m/s, MG 20 mmHG 5. Chronic anemia  6. HLD -T chol 67, HDL 28, LDL 24, TG 83 7. Systolic HF, 45-50% 8. Severe pulmonary hypertension  -mPAP 40 mmHG -PCWP 28 mmHG -TPG 13 mmHG, 2.7 WU 9. Severe mitral valve regurgitation  -flail A1 segment 10. Severe tricuspid regurgitation  11. OSA -only wears O2 at night/cannot tolerate CPAP 12. 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 Atrial flutter  -Dx 03/16/2023  Past Medical History: Past Medical History:  Diagnosis Date   ADHD (attention deficit hyperactivity  disorder)    Anemia    Anemia, chronic disease 07/13/2011   Anxiety    Arthritis    Bipolar 2 disorder (HCC) 08/18/2011   Diagnosed in 2011.    Bipolar disorder (HCC)    Blood transfusion    Blood transfusion without reported diagnosis    Chronic diastolic CHF (congestive heart failure) (HCC)    Complication of anesthesia    Woke up intubated gets combative  afraid to be alone    Congestive heart failure (CHF) (HCC) 07/13/2011   Onset 2006.  Followed by Dr. Nishan.  S/p cardiac catheterization in [place] 2006 Dr. Skains..  S/p cardiac catheterization in 2014 by Nishan.  Echo 2014.    Depression    Erectile dysfunction    ESRD on hemodialysis (HCC) 07/13/2011   Glomerulonephritis at age 22, started HD in 1996.  Deceased donor renal transplant 1999 at [facility], then kidney failed and went back on HD in 2006.  L forearm AVF . s/p repeat renal transplant 10/2014 [facility].   GI bleed 11/01/2011   Rectal bleeding with emesis/diarrhea    Hemodialysis status (HCC)    Hydrocele, right 11/25/2014   Hypertension    Hypertensive emergency 01/06/2013   Hypoglycemia 01/11/2013   Influenza-like illness 12/18/2013   Mild ascending aorta dilatation (HCC)    NICM (nonischemic cardiomyopathy) (HCC)    Pericardial effusion    a. Mod by echo in 2014,   similar to prior.   Peripheral polyneuropathy 08/04/2016   Pulmonary edema 01/06/2013   Pulmonary hypertension associated with end stage renal disease on dialysis (HCC) 07/13/2011   Renal transplant, status post 11/25/2014   Pt with Glomerulonephritis at age 22. Deceased donor renal transplant 1999 right and left renal transplant 10/2014.  Followed by [facility] transplant team; nephrologist is Dr. Dajeijh/[facility].    Respiratory failure with hypoxia (HCC) 01/06/2013   Shortness of breath 12/12/2012   Swollen testicle 07/13/2011   URI (upper respiratory infection) 01/25/2012    Past Surgical History: Past Surgical History:  Procedure Laterality Date   ANGIOPLASTY     BUBBLE STUDY   09/05/2022   Procedure: BUBBLE STUDY;  Surgeon: O'Neal, Gilberts Thomas, MD;  Location: [facility];  Service: Cardiovascular;;   CHOLECYSTECTOMY     DIALYSIS FISTULA CREATION     EVALUATION UNDER ANESTHESIA WITH HEMORRHOIDECTOMY N/A 03/15/2020   Procedure: CONTROL OF ANAL BLEEDING;  Surgeon: White, Christopher M, MD;  Location: [facility] OR;  Service: General;  Laterality: N/A;   KIDNEY TRANSPLANT  10/17/14   KNEE SURGERY     LEFT HEART CATHETERIZATION WITH CORONARY ANGIOGRAM N/A 10/01/2013   Procedure: LEFT HEART CATHETERIZATION WITH CORONARY ANGIOGRAM;  Surgeon: Peter C Nishan, MD;  Location: [facility] CATH LAB;  Service: Cardiovascular;  Laterality: N/A;   MOUTH SURGERY     NEPHRECTOMY TRANSPLANTED ORGAN     RIGHT/LEFT HEART CATH AND CORONARY ANGIOGRAPHY N/A 11/24/2022   Procedure: RIGHT/LEFT HEART CATH AND CORONARY ANGIOGRAPHY;  Surgeon: McAlhany, Christopher D, MD;  Location: [facility] INVASIVE CV LAB;  Service: Cardiovascular;  Laterality: N/A;   TEE WITHOUT CARDIOVERSION N/A 09/05/2022   Procedure: TRANSESOPHAGEAL ECHOCARDIOGRAM (TEE);  Surgeon: O'Neal, Winn Thomas, MD;  Location: [facility];  Service: Cardiovascular;  Laterality: N/A;    Current Medications: Current Meds  Medication Sig   acetaminophen (TYLENOL) 500 MG tablet Take 1,000 mg by mouth daily as needed for moderate pain.   acyclovir (ZOVIRAX) 200 MG capsule Take 200 mg by mouth 2 (two) times daily.   amLODipine (NORVASC) 2.5 MG tablet Take 1 tablet (2.5 mg total) by mouth daily. (Patient taking differently: Take 2.5 mg by mouth See admin instructions. Take 2.5 mg on Tuesday and Thursday, Saturday and Sunday once a day in the morning)   amphetamine-dextroamphetamine (ADDERALL XR) 20 MG 24 hr capsule Take 20 mg by mouth every morning.   amphetamine-dextroamphetamine (ADDERALL) 10 MG tablet Take 10 mg by mouth every evening.   apixaban (ELIQUIS) 5 MG TABS tablet Take 1 tablet (5 mg total) by mouth 2 (two) times daily.   atorvastatin  (LIPITOR) 10 MG tablet Take 1 tablet (10 mg total) by mouth daily.   bismuth subsalicylate (PEPTO BISMOL) 262 MG chewable tablet Chew 524 mg by mouth as needed for indigestion.   calcium carbonate (TUMS EX) 750 MG chewable tablet Chew 1,500 mg by mouth See admin instructions. Take 1500 mg twice daily with snacks   carvedilol (COREG) 12.5 MG tablet Take 12.5 mg by mouth See admin instructions. Take 12.5 mg on Monday,Wednesday and Friday once a day in the morning   carvedilol (COREG) 25 MG tablet Take 1 tablet (25 mg total) by mouth 2 times daily with meals. (Patient taking differently: Take 25 mg by mouth See admin instructions. Take 25 mg twice a day on Tuesday,Thursday, Saturday and Sunday)   clobetasol ointment (TEMOVATE) 0.05 % Apply 1 Application topically 2 (two) times daily as needed (rash).   clonazePAM (KLONOPIN) 0.5 MG tablet Take 1   mg by mouth See admin instructions. Take 1 mg Monday, Wednesday and Friday in the evening   HYDROcodone-acetaminophen (NORCO) 7.5-325 MG tablet Take 1 tablet by mouth 3 (three) times daily as needed for severe pain or moderate pain.   lidocaine-prilocaine (EMLA) cream Apply 1 Application topically every Monday, Wednesday, and Friday. One hour prior to Dialysis   sevelamer carbonate (RENVELA) 0.8 g PACK packet Take 0.8-1.6 g by mouth 3 (three) times daily with meals.     Allergies:    Delacort [hydrocortisone base]; Benicar [olmesartan]; Diltiazem; Food; Hydralazine; Nitrates, organic; and Psyllium   Social History: Social History   Socioeconomic History   Marital status: Single    Spouse name: Not on file   Number of children: 0   Years of education: 20+   Highest education level: Not on file  Occupational History   Occupation: Chef- fulltime  Tobacco Use   Smoking status: Former    Types: Cigars   Smokeless tobacco: Never  Vaping Use   Vaping Use: Never used  Substance and Sexual Activity   Alcohol use: No    Alcohol/week: 1.0 standard drink of  alcohol    Types: 1 Standard drinks or equivalent per week    Comment: Stopped drinking after Kidney transplant in December 2015   Drug use: No   Sexual activity: Yes  Other Topics Concern   Not on file  Social History Narrative   Marital status: single; dating      Children; none      Lives: alone      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      Tobacco: none      Alcohol: none      Drugs: none      Exercise: cycling; 3 days per week.      Sexual activity: females only; no male encounters; Chlamydia age 22.  Total partner = 55.  Condoms.      Caffeine use: Soda sometimes   Social Determinants of Health   Financial Resource Strain: Not on file  Food Insecurity: Not on file  Transportation Needs: Not on file  Physical Activity: Not on file  Stress: Not on file  Social Connections: Not on file     Family History: The patient's family history includes Arthritis in his father and mother; Depression in his father and mother; Diabetes in his father and mother; Hypertension in his father and mother.  ROS:   All other ROS reviewed and negative. Pertinent positives noted in the HPI.     EKGs/Labs/Other Studies Reviewed:   The following studies were personally reviewed by me today:  EKG:  EKG is ordered today.  The ekg ordered today demonstrates atrial flutter heart rate 80, left axis deviation, and was personally reviewed by me.  LHC/RHC 11/24/2022 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   Mild calcified plaque in the mid segment of the large dominant RCA No obstructive disease in the Circumflex or LAD Elevated right heart pressures (RA 25, RV 48/8/25, PA 58/30 mean 40, PCWP 28).  AO 137/79, LV 146/15/21, CO 9.8 L/min, CI 4.45   Severe MR and TR by echo.  Moderate aortic stenosis (peak to peak gradient 14.8, mean gradient 14.6)   Recent Labs: 11/24/2022: BUN 35; Creatinine, Ser 8.05; Hemoglobin 12.6; Platelets 202; Potassium  4.9; Sodium 138   Recent Lipid Panel    Component Value Date/Time   CHOL  11/04/2007 0424    83          ATP III CLASSIFICATION:  <200     mg/dL   Desirable  200-239  mg/dL   Borderline High  >=240    mg/dL   High   TRIG 60 11/04/2007 0424   HDL 20 (L) 11/04/2007 0424   CHOLHDL 4.2 11/04/2007 0424   VLDL 12 11/04/2007 0424   LDLCALC  11/04/2007 0424    51        Total Cholesterol/HDL:CHD Risk Coronary Heart Disease Risk Table                     Men   Women  1/2 Average Risk   3.4   3.3    Physical Exam:   VS:  Pulse 80   Ht 6' 2" (1.88 m)   Wt 215 lb 3.2 oz (97.6 kg)   SpO2 99%   BMI 27.63 kg/m    Wt Readings from Last 3 Encounters:  03/16/23 215 lb 3.2 oz (97.6 kg)  01/27/23 227 lb (103 kg)  01/02/23 212 lb (96.2 kg)    General: Well nourished, well developed, in no acute distress Head: Atraumatic, normal size  Eyes: PEERLA, EOMI  Neck: Supple, no JVD Endocrine: No thryomegaly Cardiac: Irregular rhythm; 3 out of 6 harsh holosystolic murmur Lungs: Clear to auscultation bilaterally, no wheezing, rhonchi or rales  Abd: Soft, nontender, no hepatomegaly  Ext: 2+ pitting edema bilaterally Musculoskeletal: No deformities, BUE and BLE strength normal and equal Skin: Warm and dry, no rashes   Neuro: Alert and oriented to person, place, time, and situation, CNII-XII grossly intact, no focal deficits  Psych: Normal mood and affect   ASSESSMENT:   Nolawi Dominy is a 52 y.o. male who presents for the following: 1. Typical atrial flutter (HCC)   2. Nonrheumatic mitral valve regurgitation   3. Nonrheumatic tricuspid valve regurgitation   4. Nonrheumatic aortic valve stenosis   5. Pulmonary hypertension, unspecified (HCC)   6. Chronic systolic heart failure (HCC)   7. Coronary artery disease involving native coronary artery of native heart without angina pectoris   8. Mixed hyperlipidemia     PLAN:   1. Typical atrial flutter (HCC) -New diagnosis of atrial flutter.   Suspect this is related to severe tricuspid valve regurgitation and dilation of the tricuspid annulus.  He also had recent surgery.  We will start Eliquis 5 mg twice daily.  He is on carvedilol.  He is rate controlled with a heart rate of 80 bpm.  We will set him up for a cardioversion in 3 weeks.  He will need to continue with uninterrupted anticoagulation for at least 4 weeks after surgery.  CHA2DS2-VASc equals 2 (hypertension, CHF).  He would merit lifelong anticoagulation however I have concerns about this given chronic anemia.  Most recent hemoglobin is 9.4.  We will do we can to get him back in rhythm safely.  We will also asked for surgical A-fib/flutter ablation and left atrial appendage clipping.  I think the best course of action given anemia is a short course of anticoagulation to get him through restoration of sinus rhythm.  2. Nonrheumatic mitral valve regurgitation 3. Nonrheumatic tricuspid valve regurgitation 4. Nonrheumatic aortic valve stenosis -Severe mitral valve regurgitation due to flail segment.  Severe tricuspid regurgitation likely mixed etiology from RV dilation and pulmonary hypertension.  Also with moderate aortic stenosis.  No CAD per left heart cath.  Pulmonary hypertension not that bad.  Seems to be a candidate for cardiac surgery.  Given development of arrhythmia   I have asked him to expedite his cardiac surgery.  Hopefully we can plan for this in late summer.  5. Pulmonary hypertension, unspecified (HCC) -Secondary to left heart disease.  Wood unit 2.7.  Plan for cardiac surgery.  This is all secondary to valvular heart disease and likely untreated sleep apnea.  6. Chronic systolic heart failure (HCC) -EF 45 to 50%.  Slightly volume up.  Volume management per dialysis.  7. Coronary artery disease involving native coronary artery of native heart without angina pectoris -Nonobstructive disease.  No aspirin due to chronic anemia and need for Eliquis.  On Lipitor.  LDL at  goal.  8. Mixed hyperlipidemia -LDL at goal.   Shared Decision Making/Informed Consent The risks (stroke, cardiac arrhythmias rarely resulting in the need for a temporary or permanent pacemaker, skin irritation or burns and complications associated with conscious sedation including aspiration, arrhythmia, respiratory failure and death), benefits (restoration of normal sinus rhythm) and alternatives of a direct current cardioversion were explained in detail to Mr. Kolker and he agrees to proceed.    Disposition: Return in about 6 weeks (around 04/27/2023).  Medication Adjustments/Labs and Tests Ordered: Current medicines are reviewed at length with the patient today.  Concerns regarding medicines are outlined above.  Orders Placed This Encounter  Procedures   EKG 12-Lead   Meds ordered this encounter  Medications   apixaban (ELIQUIS) 5 MG TABS tablet    Sig: Take 1 tablet (5 mg total) by mouth 2 (two) times daily.    Dispense:  60 tablet    Refill:  3    Patient Instructions  Medication Instructions:  START Eliquis 5 mg twice daily   *If you need a refill on your cardiac medications before your next appointment, please call your pharmacy*   Testing/Procedures: Your physician has requested that you have a Cardioversion. Electrical Cardioversion uses a jolt of electricity to your heart either through paddles or wired patches attached to your chest. This is a controlled, usually prescheduled, procedure. This procedure is done at the hospital and you are not awake during the procedure. You usually go home the day of the procedure. Please see the instruction sheet given to you today for more information.     Follow-Up: At  [facility], you and your health needs are our priority.  As part of our continuing mission to provide you with exceptional heart care, we have created designated Provider Care Teams.  These Care Teams include your primary Cardiologist (physician) and Advanced  Practice Providers (APPs -  Physician Assistants and Nurse Practitioners) who all work together to provide you with the care you need, when you need it.  We recommend signing up for the patient portal called "MyChart".  Sign up information is provided on this After Visit Summary.  MyChart is used to connect with patients for Virtual Visits (Telemedicine).  Patients are able to view lab/test results, encounter notes, upcoming appointments, etc.  Non-urgent messages can be sent to your provider as well.   To learn more about what you can do with MyChart, go to [email or web address].    Your next appointment:   6 week(s)  Provider:   [place] O'Neal, MD   Other Instructions    Dear Jacquel Rotundo  You are scheduled for a Cardioversion on Thursday, June 6 with Dr. Skains.  Please arrive at the [facility] (Main Entrance A) at [facility]: [address] [place], [place] [address] at 9:30 AM (This time is 1 hour(s) before your   procedure to ensure your preparation). Free valet parking service is available. You will check in at ADMITTING. The support person will be asked to wait in the waiting room.  It is OK to have someone drop you off and come back when you are ready to be discharged.     DIET:  Nothing to eat or drink after midnight except a sip of water with medications (see medication instructions below)  MEDICATION INSTRUCTIONS: !!IF ANY NEW MEDICATIONS ARE STARTED AFTER TODAY, PLEASE NOTIFY YOUR PROVIDER AS SOON AS POSSIBLE!!  FYI: Medications such as Semaglutide (Ozempic, Wegovy), Tirzepatide (Mounjaro, Zepbound), Dulaglutide (Trulicity), etc ("GLP1 agonists") must be held around the time of a procedure. Talk to your provider if you take one of these.  LABS: none today   FYI:  For your safety, and to allow us to monitor your vital signs accurately during the surgery/procedure we request: If you have artificial nails, gel coating, SNS etc, please have those removed prior to your  surgery/procedure. Not having the nail coverings /polish removed may result in cancellation or delay of your surgery/procedure.  You must have a responsible person to drive you home and stay in the waiting area during your procedure. Failure to do so could result in cancellation.  Bring your insurance cards.  *Special Note: Every effort is made to have your procedure done on time. Occasionally there are emergencies that occur at the hospital that may cause delays. Please be patient if a delay does occur.        Time Spent with Patient: I have spent a total of 35 minutes with patient reviewing hospital notes, telemetry, EKGs, labs and examining the patient as well as establishing an assessment and plan that was discussed with the patient.  > 50% of time was spent in direct patient care.  Signed, [place] T. O'Neal, MD, FACC [place]  [facility]  [address], [address] [place], [place] [address] [phone]  03/16/2023 2:13 PM     

## 2023-03-15 NOTE — Progress Notes (Signed)
Cardiology Office Note:   Date:  03/16/2023  NAME:  [name]    MRN: [id number] DOB:  August 24, 1971   PCP:  Donne Anon, MD  Cardiologist:  None  Electrophysiologist:  None   Referring MD: Donne Anon, MD   Chief Complaint  Patient presents with   Follow-up        History of Present Illness:   [name] is a 52 y.o. male with a hx of severe MR, moderate aortic stenosis, severe tricuspid regurgitation, ESRD who presents for follow-up. Also with severe pHTN.  He presents for follow-up.  Recently had a urological infection.  Had to undergo surgery.  Reports infection in his scrotum and bladder.  Required surgery.  Recovering numbness.  EKG shows atrial flutter.  He is feeling his heart racing.  Feeling this daily.  He did require transfusion recently.  He has chronic anemia.  Hemoglobin seems to be stable.  He reports no chest pain.  Has 2+ pitting edema.  We discussed that he should not delay his cardiac surgery.  He needs to get this done after cardioversion.  We discussed starting Eliquis and pursuing a cardioversion in the next 3 weeks.  He is willing to do this.  CV exam unchanged.  Appears volume overloaded but no major complaints.  Problem List  1. 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 HTN 3. Perianal condyloma  4. Moderate aortic stenosis -V max 3.1 m/s, MG 21 mmHG, AVA 1.5 cm2 05/2020 -Vmax 3.0 m/s, MG 20 mmHG 5. Chronic anemia  6. HLD -T chol 67, HDL 28, LDL 24, TG 83 7. Systolic HF, 45-50% 8. Severe pulmonary hypertension  -mPAP 40 mmHG -PCWP 28 mmHG -TPG 13 mmHG, 2.7 WU 9. Severe mitral valve regurgitation  -flail A1 segment 10. Severe tricuspid regurgitation  11. OSA -only wears O2 at night/cannot tolerate CPAP 12. 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 Atrial flutter  -Dx 03/16/2023  Past Medical History: Past Medical History:  Diagnosis Date   ADHD (attention deficit hyperactivity  disorder)    Anemia    Anemia, chronic disease 07/13/2011   Anxiety    Arthritis    Bipolar 2 disorder (HCC) 08/18/2011   Diagnosed in 2011.    Bipolar disorder (HCC)    Blood transfusion    Blood transfusion without reported diagnosis    Chronic diastolic CHF (congestive heart failure) (HCC)    Complication of anesthesia    Woke up intubated gets combative  afraid to be alone    Congestive heart failure (CHF) (HCC) 07/13/2011   Onset 2006.  Followed by Dr. Eden Emms.  S/p cardiac catheterization in [facility] Dr. Anne Fu..  S/p cardiac catheterization in 2014 by [facility].  Echo 2014.    Depression    Erectile dysfunction    ESRD on hemodialysis (HCC) 07/13/2011   Glomerulonephritis at age 19, started HD in 28.  Deceased donor renal transplant 1999 at [facility], then kidney failed and went back on HD in 2006.  L forearm AVF . s/p repeat renal transplant 10/2014 [facility].   GI bleed 11/01/2011   Rectal bleeding with emesis/diarrhea    Hemodialysis status (HCC)    Hydrocele, right 11/25/2014   Hypertension    Hypertensive emergency 01/06/2013   Hypoglycemia 01/11/2013   Influenza-like illness 12/18/2013   Mild ascending aorta dilatation (HCC)    NICM (nonischemic cardiomyopathy) (HCC)    Pericardial effusion    a. Mod by echo in 2014,  similar to prior.   Peripheral polyneuropathy 08/04/2016   Pulmonary edema 01/06/2013   Pulmonary hypertension associated with end stage renal disease on dialysis [facility]) 07/13/2011   Renal transplant, status post 11/25/2014   Pt with Glomerulonephritis at age 57. Deceased donor renal transplant 1999 right and left renal transplant 10/2014.  Followed by [facility] transplant team; nephrologist is Dr. Lonna Cobb.    Respiratory failure with hypoxia (HCC) 01/06/2013   Shortness of breath 12/12/2012   Swollen testicle 07/13/2011   URI (upper respiratory infection) 01/25/2012    Past Surgical History: Past Surgical History:  Procedure Laterality Date   ANGIOPLASTY     BUBBLE STUDY   09/05/2022   Procedure: BUBBLE STUDY;  Surgeon: Sande Rives, MD;  Location: [facility] ENDOSCOPY;  Service: Cardiovascular;;   CHOLECYSTECTOMY     DIALYSIS FISTULA CREATION     EVALUATION UNDER ANESTHESIA WITH HEMORRHOIDECTOMY N/A 03/15/2020   Procedure: CONTROL OF ANAL BLEEDING;  Surgeon: Andria Meuse, MD;  Location: [facility] OR;  Service: General;  Laterality: N/A;   KIDNEY TRANSPLANT  10/17/14   KNEE SURGERY     LEFT HEART CATHETERIZATION WITH CORONARY ANGIOGRAM N/A 10/01/2013   Procedure: LEFT HEART CATHETERIZATION WITH CORONARY ANGIOGRAM;  Surgeon: Wendall Stade, MD;  Location: [facility] CATH LAB;  Service: Cardiovascular;  Laterality: N/A;   MOUTH SURGERY     NEPHRECTOMY TRANSPLANTED ORGAN     RIGHT/LEFT HEART CATH AND CORONARY ANGIOGRAPHY N/A 11/24/2022   Procedure: RIGHT/LEFT HEART CATH AND CORONARY ANGIOGRAPHY;  Surgeon: Kathleene Hazel, MD;  Location: [facility] INVASIVE CV LAB;  Service: Cardiovascular;  Laterality: N/A;   TEE WITHOUT CARDIOVERSION N/A 09/05/2022   Procedure: TRANSESOPHAGEAL ECHOCARDIOGRAM (TEE);  Surgeon: Sande Rives, MD;  Location: [facility] ENDOSCOPY;  Service: Cardiovascular;  Laterality: N/A;    Current Medications: Current Meds  Medication Sig   acetaminophen (TYLENOL) 500 MG tablet Take 1,000 mg by mouth daily as needed for moderate pain.   acyclovir (ZOVIRAX) 200 MG capsule Take 200 mg by mouth 2 (two) times daily.   amLODipine (NORVASC) 2.5 MG tablet Take 1 tablet (2.5 mg total) by mouth daily. (Patient taking differently: Take 2.5 mg by mouth See admin instructions. Take 2.5 mg on Tuesday and Thursday, Saturday and Sunday once a day in the morning)   amphetamine-dextroamphetamine (ADDERALL XR) 20 MG 24 hr capsule Take 20 mg by mouth every morning.   amphetamine-dextroamphetamine (ADDERALL) 10 MG tablet Take 10 mg by mouth every evening.   apixaban (ELIQUIS) 5 MG TABS tablet Take 1 tablet (5 mg total) by mouth 2 (two) times daily.   atorvastatin  (LIPITOR) 10 MG tablet Take 1 tablet (10 mg total) by mouth daily.   bismuth subsalicylate (PEPTO BISMOL) 262 MG chewable tablet Chew 524 mg by mouth as needed for indigestion.   calcium carbonate (TUMS EX) 750 MG chewable tablet Chew 1,500 mg by mouth See admin instructions. Take 1500 mg twice daily with snacks   carvedilol (COREG) 12.5 MG tablet Take 12.5 mg by mouth See admin instructions. Take 12.5 mg on Monday,Wednesday and Friday once a day in the morning   carvedilol (COREG) 25 MG tablet Take 1 tablet (25 mg total) by mouth 2 times daily with meals. (Patient taking differently: Take 25 mg by mouth See admin instructions. Take 25 mg twice a day on Tuesday,Thursday, Saturday and Sunday)   clobetasol ointment (TEMOVATE) 0.05 % Apply 1 Application topically 2 (two) times daily as needed (rash).   clonazePAM (KLONOPIN) 0.5 MG tablet Take 1  mg by mouth See admin instructions. Take 1 mg Monday, Wednesday and Friday in the evening   HYDROcodone-acetaminophen (NORCO) 7.5-325 MG tablet Take 1 tablet by mouth 3 (three) times daily as needed for severe pain or moderate pain.   lidocaine-prilocaine (EMLA) cream Apply 1 Application topically every Monday, Wednesday, and Friday. One hour prior to Dialysis   sevelamer carbonate (RENVELA) 0.8 g PACK packet Take 0.8-1.6 g by mouth 3 (three) times daily with meals.     Allergies:    Delacort [hydrocortisone base]; Benicar [olmesartan]; Diltiazem; Food; Hydralazine; Nitrates, organic; and Psyllium   Social History: Social History   Socioeconomic History   Marital status: Single    Spouse name: Not on file   Number of children: 0   Years of education: 20+   Highest education level: Not on file  Occupational History   Occupation: Chef- fulltime  Tobacco Use   Smoking status: Former    Types: Cigars   Smokeless tobacco: Never  Building services engineer Use: Never used  Substance and Sexual Activity   Alcohol use: No    Alcohol/week: 1.0 standard drink of  alcohol    Types: 1 Standard drinks or equivalent per week    Comment: Stopped drinking after Kidney transplant in December 2015   Drug use: No   Sexual activity: Yes  Other Topics Concern   Not on file  Social History Narrative   Marital status: single; dating      Children; none      Lives: alone      Employment: Investment banker, operational at hotel; 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      Tobacco: none      Alcohol: none      Drugs: none      Exercise: cycling; 3 days per week.      Sexual activity: females only; no male encounters; Chlamydia age 36.  Total partner = 55.  Condoms.      Caffeine use: Soda sometimes   Social Determinants of Corporate investment banker Strain: Not on file  Food Insecurity: Not on file  Transportation Needs: Not on file  Physical Activity: Not on file  Stress: Not on file  Social Connections: Not on file     Family History: The patient's family history includes Arthritis in his father and mother; Depression in his father and mother; Diabetes in his father and mother; Hypertension in his father and mother.  ROS:   All other ROS reviewed and negative. Pertinent positives noted in the HPI.     EKGs/Labs/Other Studies Reviewed:   The following studies were personally reviewed by me today:  EKG:  EKG is ordered today.  The ekg ordered today demonstrates atrial flutter heart rate 80, left axis deviation, and was personally reviewed by me.  LHC/RHC 11/24/2022 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   Mild calcified plaque in the mid segment of the large dominant RCA No obstructive disease in the Circumflex or LAD Elevated right heart pressures (RA 25, RV 48/8/25, PA 58/30 mean 40, PCWP 28).  AO 137/79, LV 146/15/21, CO 9.8 L/min, CI 4.45   Severe MR and TR by echo.  Moderate aortic stenosis (peak to peak gradient 14.8, mean gradient 14.6)   Recent Labs: 11/24/2022: BUN 35; Creatinine, Ser 8.05; Hemoglobin 12.6; Platelets 202; Potassium  4.9; Sodium 138   Recent Lipid Panel    Component Value Date/Time   CHOL  11/04/2007 0424    83  ATP III CLASSIFICATION:  <200     mg/dL   Desirable  161-096  mg/dL   Borderline High  >=045    mg/dL   High   TRIG 60 40/98/1191 0424   HDL 20 (L) 11/04/2007 0424   CHOLHDL 4.2 11/04/2007 0424   VLDL 12 11/04/2007 0424   LDLCALC  11/04/2007 0424    51        Total Cholesterol/HDL:CHD Risk Coronary Heart Disease Risk Table                     Men   Women  1/2 Average Risk   3.4   3.3    Physical Exam:   VS:  Pulse 80   Ht 6\' 2"  (1.88 m)   Wt 215 lb 3.2 oz (97.6 kg)   SpO2 99%   BMI 27.63 kg/m    Wt Readings from Last 3 Encounters:  03/16/23 215 lb 3.2 oz (97.6 kg)  01/27/23 227 lb (103 kg)  01/02/23 212 lb (96.2 kg)    General: Well nourished, well developed, in no acute distress Head: Atraumatic, normal size  Eyes: PEERLA, EOMI  Neck: Supple, no JVD Endocrine: No thryomegaly Cardiac: Irregular rhythm; 3 out of 6 harsh holosystolic murmur Lungs: Clear to auscultation bilaterally, no wheezing, rhonchi or rales  Abd: Soft, nontender, no hepatomegaly  Ext: 2+ pitting edema bilaterally Musculoskeletal: No deformities, BUE and BLE strength normal and equal Skin: Warm and dry, no rashes   Neuro: Alert and oriented to person, place, time, and situation, CNII-XII grossly intact, no focal deficits  Psych: Normal mood and affect   ASSESSMENT:   [name] is a 52 y.o. male who presents for the following: 1. Typical atrial flutter (HCC)   2. Nonrheumatic mitral valve regurgitation   3. Nonrheumatic tricuspid valve regurgitation   4. Nonrheumatic aortic valve stenosis   5. Pulmonary hypertension, unspecified (HCC)   6. Chronic systolic heart failure (HCC)   7. Coronary artery disease involving native coronary artery of native heart without angina pectoris   8. Mixed hyperlipidemia     PLAN:   1. Typical atrial flutter (HCC) -New diagnosis of atrial flutter.   Suspect this is related to severe tricuspid valve regurgitation and dilation of the tricuspid annulus.  He also had recent surgery.  We will start Eliquis 5 mg twice daily.  He is on carvedilol.  He is rate controlled with a heart rate of 80 bpm.  We will set him up for a cardioversion in 3 weeks.  He will need to continue with uninterrupted anticoagulation for at least 4 weeks after surgery.  CHA2DS2-VASc equals 2 (hypertension, CHF).  He would merit lifelong anticoagulation however I have concerns about this given chronic anemia.  Most recent hemoglobin is 9.4.  We will do we can to get him back in rhythm safely.  We will also asked for surgical A-fib/flutter ablation and left atrial appendage clipping.  I think the best course of action given anemia is a short course of anticoagulation to get him through restoration of sinus rhythm.  2. Nonrheumatic mitral valve regurgitation 3. Nonrheumatic tricuspid valve regurgitation 4. Nonrheumatic aortic valve stenosis -Severe mitral valve regurgitation due to flail segment.  Severe tricuspid regurgitation likely mixed etiology from RV dilation and pulmonary hypertension.  Also with moderate aortic stenosis.  No CAD per left heart cath.  Pulmonary hypertension not that bad.  Seems to be a candidate for cardiac surgery.  Given development of arrhythmia  I have asked him to expedite his cardiac surgery.  Hopefully we can plan for this in late summer.  5. Pulmonary hypertension, unspecified (HCC) -Secondary to left heart disease.  Wood unit 2.7.  Plan for cardiac surgery.  This is all secondary to valvular heart disease and likely untreated sleep apnea.  6. Chronic systolic heart failure (HCC) -EF 45 to 50%.  Slightly volume up.  Volume management per dialysis.  7. Coronary artery disease involving native coronary artery of native heart without angina pectoris -Nonobstructive disease.  No aspirin due to chronic anemia and need for Eliquis.  On Lipitor.  LDL at  goal.  8. Mixed hyperlipidemia -LDL at goal.   Shared Decision Making/Informed Consent The risks (stroke, cardiac arrhythmias rarely resulting in the need for a temporary or permanent pacemaker, skin irritation or burns and complications associated with conscious sedation including aspiration, arrhythmia, respiratory failure and death), benefits (restoration of normal sinus rhythm) and alternatives of a direct current cardioversion were explained in detail to Mr. Poppleton and he agrees to proceed.    Disposition: Return in about 6 weeks (around 04/27/2023).  Medication Adjustments/Labs and Tests Ordered: Current medicines are reviewed at length with the patient today.  Concerns regarding medicines are outlined above.  Orders Placed This Encounter  Procedures   EKG 12-Lead   Meds ordered this encounter  Medications   apixaban (ELIQUIS) 5 MG TABS tablet    Sig: Take 1 tablet (5 mg total) by mouth 2 (two) times daily.    Dispense:  60 tablet    Refill:  3    Patient Instructions  Medication Instructions:  START Eliquis 5 mg twice daily   *If you need a refill on your cardiac medications before your next appointment, please call your pharmacy*   Testing/Procedures: Your physician has requested that you have a Cardioversion. Electrical Cardioversion uses a jolt of electricity to your heart either through paddles or wired patches attached to your chest. This is a controlled, usually prescheduled, procedure. This procedure is done at the hospital and you are not awake during the procedure. You usually go home the day of the procedure. Please see the instruction sheet given to you today for more information.     Follow-Up: At [facility], you and your health needs are our priority.  As part of our continuing mission to provide you with exceptional heart care, we have created designated Provider Care Teams.  These Care Teams include your primary Cardiologist (physician) and Advanced  Practice Providers (APPs -  Physician Assistants and Nurse Practitioners) who all work together to provide you with the care you need, when you need it.  We recommend signing up for the patient portal called "MyChart".  Sign up information is provided on this After Visit Summary.  MyChart is used to connect with patients for Virtual Visits (Telemedicine).  Patients are able to view lab/test results, encounter notes, upcoming appointments, etc.  Non-urgent messages can be sent to your provider as well.   To learn more about what you can do with MyChart, go to [email or web address].    Your next appointment:   6 week(s)  Provider:   Lennie Odor, MD   Other Instructions    Dear Durward Parcel  You are scheduled for a Cardioversion on Thursday, June 6 with Dr. Anne Fu.  Please arrive at the [facility] (Main Entrance A) at [facility]: [address] [place], [place] [address] at 9:30 AM (This time is 1 hour(s) before your  procedure to ensure your preparation). Free valet parking service is available. You will check in at ADMITTING. The support person will be asked to wait in the waiting room.  It is OK to have someone drop you off and come back when you are ready to be discharged.     DIET:  Nothing to eat or drink after midnight except a sip of water with medications (see medication instructions below)  MEDICATION INSTRUCTIONS: !!IF ANY NEW MEDICATIONS ARE STARTED AFTER TODAY, PLEASE NOTIFY YOUR PROVIDER AS SOON AS POSSIBLE!!  FYI: Medications such as Semaglutide (Ozempic, [place]), Tirzepatide (Mounjaro, Zepbound), Dulaglutide (Trulicity), etc ("GLP1 agonists") must be held around the time of a procedure. Talk to your provider if you take one of these.  LABS: none today   FYI:  For your safety, and to allow [place] to monitor your vital signs accurately during the surgery/procedure we request: If you have artificial nails, gel coating, SNS etc, please have those removed prior to your  surgery/procedure. Not having the nail coverings /polish removed may result in cancellation or delay of your surgery/procedure.  You must have a responsible person to drive you home and stay in the waiting area during your procedure. Failure to do so could result in cancellation.  Bring your insurance cards.  *Special Note: Every effort is made to have your procedure done on time. Occasionally there are emergencies that occur at the hospital that may cause delays. Please be patient if a delay does occur.        Time Spent with Patient: I have spent a total of 35 minutes with patient reviewing hospital notes, telemetry, EKGs, labs and examining the patient as well as establishing an assessment and plan that was discussed with the patient.  > 50% of time was spent in direct patient care.  Signed, Lenna Gilford. Flora Lipps, MD, [facility]  [facility]  [address], [address] [place], [place] [address] [phone]  03/16/2023 2:13 PM

## 2023-03-16 ENCOUNTER — Encounter: Payer: Self-pay | Admitting: Cardiovascular Disease

## 2023-03-16 ENCOUNTER — Ambulatory Visit: Payer: Medicare HMO | Attending: Cardiovascular Disease | Admitting: Cardiovascular Disease

## 2023-03-16 VITALS — HR 80 | Ht 74.0 in | Wt 215.2 lb

## 2023-03-16 DIAGNOSIS — I350 Nonrheumatic aortic (valve) stenosis: Secondary | ICD-10-CM | POA: Diagnosis not present

## 2023-03-16 DIAGNOSIS — I340 Nonrheumatic mitral (valve) insufficiency: Secondary | ICD-10-CM

## 2023-03-16 DIAGNOSIS — I2510 Atherosclerotic heart disease of native coronary artery without angina pectoris: Secondary | ICD-10-CM

## 2023-03-16 DIAGNOSIS — I5022 Chronic systolic (congestive) heart failure: Secondary | ICD-10-CM

## 2023-03-16 DIAGNOSIS — I483 Typical atrial flutter: Secondary | ICD-10-CM

## 2023-03-16 DIAGNOSIS — I361 Nonrheumatic tricuspid (valve) insufficiency: Secondary | ICD-10-CM

## 2023-03-16 DIAGNOSIS — I272 Pulmonary hypertension, unspecified: Secondary | ICD-10-CM

## 2023-03-16 DIAGNOSIS — E782 Mixed hyperlipidemia: Secondary | ICD-10-CM

## 2023-03-16 MED ORDER — APIXABAN 5 MG PO TABS: 5.0000 mg | ORAL_TABLET | Freq: Two times a day (BID) | ORAL | 3 refills | Status: DC

## 2023-03-16 NOTE — Patient Instructions (Addendum)
Medication Instructions:  START Eliquis 5 mg twice daily   *If you need a refill on your cardiac medications before your next appointment, please call your pharmacy*   Testing/Procedures: Your physician has requested that you have a Cardioversion. Electrical Cardioversion uses a jolt of electricity to your heart either through paddles or wired patches attached to your chest. This is a controlled, usually prescheduled, procedure. This procedure is done at the hospital and you are not awake during the procedure. You usually go home the day of the procedure. Please see the instruction sheet given to you today for more information.     Follow-Up: At [facility], you and your health needs are our priority.  As part of our continuing mission to provide you with exceptional heart care, we have created designated Provider Care Teams.  These Care Teams include your primary Cardiologist (physician) and Advanced Practice Providers (APPs -  Physician Assistants and Nurse Practitioners) who all work together to provide you with the care you need, when you need it.  We recommend signing up for the patient portal called "MyChart".  Sign up information is provided on this After Visit Summary.  MyChart is used to connect with patients for Virtual Visits (Telemedicine).  Patients are able to view lab/test results, encounter notes, upcoming appointments, etc.  Non-urgent messages can be sent to your provider as well.   To learn more about what you can do with MyChart, go to [email or web address].    Your next appointment:   6 week(s)  Provider:   Lennie Odor, MD   Other Instructions    Dear [name]  You are scheduled for a Cardioversion on Thursday, June 6 with Dr. Anne Fu.  Please arrive at the [facility] (Main Entrance A) at [facility]: [address] [place], [place] [address] at 9:30 AM (This time is 1 hour(s) before your procedure to ensure your preparation). Free valet parking  service is available. You will check in at ADMITTING. The support person will be asked to wait in the waiting room.  It is OK to have someone drop you off and come back when you are ready to be discharged.     DIET:  Nothing to eat or drink after midnight except a sip of water with medications (see medication instructions below)  MEDICATION INSTRUCTIONS: !!IF ANY NEW MEDICATIONS ARE STARTED AFTER TODAY, PLEASE NOTIFY YOUR PROVIDER AS SOON AS POSSIBLE!!  FYI: Medications such as Semaglutide (Ozempic, [place]), Tirzepatide (Mounjaro, Zepbound), Dulaglutide (Trulicity), etc ("GLP1 agonists") must be held around the time of a procedure. Talk to your provider if you take one of these.  LABS: none today   FYI:  For your safety, and to allow [place] to monitor your vital signs accurately during the surgery/procedure we request: If you have artificial nails, gel coating, SNS etc, please have those removed prior to your surgery/procedure. Not having the nail coverings /polish removed may result in cancellation or delay of your surgery/procedure.  You must have a responsible person to drive you home and stay in the waiting area during your procedure. Failure to do so could result in cancellation.  Bring your insurance cards.  *Special Note: Every effort is made to have your procedure done on time. Occasionally there are emergencies that occur at the hospital that may cause delays. Please be patient if a delay does occur.

## 2023-03-17 DIAGNOSIS — I483 Typical atrial flutter: Secondary | ICD-10-CM | POA: Insufficient documentation

## 2023-03-22 ENCOUNTER — Other Ambulatory Visit: Payer: Self-pay | Admitting: Thoracic Surgery (Cardiothoracic Vascular Surgery)

## 2023-03-22 DIAGNOSIS — I340 Nonrheumatic mitral (valve) insufficiency: Secondary | ICD-10-CM

## 2023-03-22 NOTE — Progress Notes (Unsigned)
echo

## 2023-04-07 ENCOUNTER — Telehealth: Payer: Self-pay | Admitting: Cardiovascular Disease

## 2023-04-07 NOTE — Telephone Encounter (Signed)
Spoke to patient. Patient states he taking antibiotic because  a bladder  infection  - the infection went into his scrotum- it need to be clean and adrain placed. -  Urologist did a f/u CT - possible small amount infection still present - Bactrim given.   Patient aware will forward to Dr Flora Lipps  for response  about continuing with Cardioversion on 04/13/23. Patient would like the response to be left on his answer machine- because he sleeps after dialysis

## 2023-04-07 NOTE — Telephone Encounter (Signed)
Pt c/o medication issue:  1. Name of Medication:   apixaban (ELIQUIS) 5 MG TABS tablet    2. How are you currently taking this medication (dosage and times per day)? Take 1 tablet (5 mg total) by mouth 2 (two) times daily.   3. Are you having a reaction (difficulty breathing--STAT)? No  4. What is your medication issue? Pt states that he was told to make provider aware when he missed a dose of medication. Pt states that he missed a dose on 5/13. Please advise

## 2023-04-07 NOTE — Telephone Encounter (Signed)
Patient stated he missed his morning dose of Eliquis on May 13 and was told to inform you of any mis doses

## 2023-04-07 NOTE — Telephone Encounter (Signed)
Pt c/o medication issue:  1. Name of Medication: Bactrium 480 MG  2. How are you currently taking this medication (dosage and times per day)? Twice daily by mouth for 14 days  3. Are you having a reaction (difficulty breathing--STAT)? No  4. What is your medication issue? Pt would like a callback regarding whether he is still able to have scheduled procedure on 6/6 due to him being on this ABT. Please advise

## 2023-04-10 NOTE — Telephone Encounter (Signed)
Patient stated he did not miss anymore doses of Eliquis besides the one he reported. He verbalized understanding that he can not miss anymore doses.

## 2023-04-11 NOTE — Telephone Encounter (Signed)
Spoke to patient . Patient states the urologist  did not mention anymore procedures that are needed.  Patient just need to complete  taking the bactrim.  Patient is aware  once he has the Cardioversion he will not be able to stop Anticoagulation for at least 4 weeks .  Patient is aware  information will be sent to Dr Val EagleJennette Kettle-  responded if cardioversion is still  on for 6/6//24

## 2023-04-11 NOTE — Telephone Encounter (Signed)
Called patient, I didn't see RN had spoken with him already- he just explained he wanted to get his stuff together. Patient verbalized to continue plan for now.  Patient verbalized understanding

## 2023-04-11 NOTE — Telephone Encounter (Signed)
If he needs surgery, we will have to hold on the cardioversion. His anticoagulation cannot be missed 3 weeks before the cardioversion and 4 weeks after the cardioversion. This means he cannot have any surgery for that 7 week window. Can he let us know if he needs a procedure for the infection? That way we can cancel cardioversion if needed.  Gerri Spore T. Flora Lipps, MD, [facility]  [facility] [address] [place], [place] [address] [phone] 8:40 PM

## 2023-04-11 NOTE — Telephone Encounter (Signed)
Patient is aware to proceed with cardioversion on 04/13/23,patient is aware he will not be able to stop medication     Per note from Dr Val EagleJennette Kettle  OK to proceed with DCCV. He will not have missed any doses within 3 weeks of the procedure. No planned interruption in [facility] after either.  Gerri Spore T. Flora Lipps, MD, [facility]  [facility] [address] [place], [place] [address] [phone] 12:26 PM

## 2023-04-12 NOTE — Pre-Procedure Instructions (Signed)
Spoke to patient on phone regarding cardioversion - arrive at 0930, NPO after midnight, confirmed patient has ride home and responsible person for 24 hours after, confirmed no missed doses of eliquis, instructed patient to continue taking eliquis and take pills in the AM with a sip of water

## 2023-04-13 ENCOUNTER — Encounter (HOSPITAL_COMMUNITY): Payer: Self-pay | Admitting: Cardiology

## 2023-04-13 ENCOUNTER — Ambulatory Visit (HOSPITAL_BASED_OUTPATIENT_CLINIC_OR_DEPARTMENT_OTHER): Payer: Medicare HMO | Admitting: Anesthesiology

## 2023-04-13 ENCOUNTER — Ambulatory Visit (HOSPITAL_COMMUNITY)
Admission: RE | Admit: 2023-04-13 | Discharge: 2023-04-13 | Disposition: A | Discharge status: Home / Self Care | Payer: Medicare HMO | Attending: Cardiology | Admitting: Cardiology

## 2023-04-13 ENCOUNTER — Encounter (HOSPITAL_COMMUNITY)
Admission: RE | Disposition: A | Discharge status: Home / Self Care | Payer: Self-pay | Source: Home / Self Care | Attending: Cardiology

## 2023-04-13 ENCOUNTER — Other Ambulatory Visit: Payer: Self-pay

## 2023-04-13 ENCOUNTER — Ambulatory Visit (HOSPITAL_COMMUNITY): Payer: Medicare HMO | Admitting: Anesthesiology

## 2023-04-13 DIAGNOSIS — Z87891 Personal history of nicotine dependence: Secondary | ICD-10-CM | POA: Insufficient documentation

## 2023-04-13 DIAGNOSIS — I4891 Unspecified atrial fibrillation: Secondary | ICD-10-CM | POA: Diagnosis not present

## 2023-04-13 DIAGNOSIS — I483 Typical atrial flutter: Secondary | ICD-10-CM | POA: Diagnosis present

## 2023-04-13 DIAGNOSIS — N186 End stage renal disease: Secondary | ICD-10-CM

## 2023-04-13 DIAGNOSIS — E782 Mixed hyperlipidemia: Secondary | ICD-10-CM | POA: Diagnosis not present

## 2023-04-13 DIAGNOSIS — I272 Pulmonary hypertension, unspecified: Secondary | ICD-10-CM | POA: Insufficient documentation

## 2023-04-13 DIAGNOSIS — I2510 Atherosclerotic heart disease of native coronary artery without angina pectoris: Secondary | ICD-10-CM | POA: Diagnosis not present

## 2023-04-13 DIAGNOSIS — G4733 Obstructive sleep apnea (adult) (pediatric): Secondary | ICD-10-CM | POA: Insufficient documentation

## 2023-04-13 DIAGNOSIS — D631 Anemia in chronic kidney disease: Secondary | ICD-10-CM | POA: Insufficient documentation

## 2023-04-13 DIAGNOSIS — Z940 Kidney transplant status: Secondary | ICD-10-CM | POA: Insufficient documentation

## 2023-04-13 DIAGNOSIS — Z79899 Other long term (current) drug therapy: Secondary | ICD-10-CM | POA: Diagnosis not present

## 2023-04-13 DIAGNOSIS — Z992 Dependence on renal dialysis: Secondary | ICD-10-CM | POA: Insufficient documentation

## 2023-04-13 DIAGNOSIS — I5022 Chronic systolic (congestive) heart failure: Secondary | ICD-10-CM | POA: Insufficient documentation

## 2023-04-13 DIAGNOSIS — I132 Hypertensive heart and chronic kidney disease with heart failure and with stage 5 chronic kidney disease, or end stage renal disease: Secondary | ICD-10-CM

## 2023-04-13 DIAGNOSIS — I509 Heart failure, unspecified: Secondary | ICD-10-CM | POA: Diagnosis not present

## 2023-04-13 DIAGNOSIS — I083 Combined rheumatic disorders of mitral, aortic and tricuspid valves: Secondary | ICD-10-CM | POA: Diagnosis not present

## 2023-04-13 HISTORY — PX: CARDIOVERSION: SHX1299

## 2023-04-13 SURGERY — CARDIOVERSION
Anesthesia: General

## 2023-04-13 MED ORDER — PROPOFOL 10 MG/ML IV BOLUS
INTRAVENOUS | Status: DC | PRN
  Administered 2023-04-13: 20 mg via INTRAVENOUS
  Administered 2023-04-13: 60 mg via INTRAVENOUS

## 2023-04-13 MED ORDER — SODIUM CHLORIDE 0.9 % IV SOLN: INTRAVENOUS | Status: DC

## 2023-04-13 MED ORDER — LIDOCAINE 2% (20 MG/ML) 5 ML SYRINGE
INTRAMUSCULAR | Status: DC | PRN
  Administered 2023-04-13: 60 mg via INTRAVENOUS

## 2023-04-13 SURGICAL SUPPLY — 1 items: ELECT DEFIB PAD ADLT CADENCE (PAD) ×2 IMPLANT

## 2023-04-13 NOTE — CV Procedure (Signed)
    Electrical Cardioversion Procedure Note Clara Vieyra [id number] [birth year]  Procedure: Electrical Cardioversion Indications:  Atrial Fibrillation  Time Out: Verified patient identification, verified procedure,medications/allergies/relevent history reviewed, required imaging and test results available.  Performed  Procedure Details  The patient was NPO after midnight. Anesthesia was administered at the beside  by Dr.Jackson with propofol (IV left forearm.  Cardioversion was performed with synchronized biphasic defibrillation via AP pads with 200 joules.  1 attempt(s) were performed.  The patient converted to normal sinus rhythm. The patient tolerated the procedure well   IMPRESSION:  Successful cardioversion of atrial fibrillation    Donato Schultz 04/13/2023, 10:11 AM

## 2023-04-13 NOTE — Transfer of Care (Signed)
Immediate Anesthesia Transfer of Care Note  Patient: [name]  Procedure(s) Performed: CARDIOVERSION  Patient Location: Cath Lab  Anesthesia Type:MAC  Level of Consciousness: sedated  Airway & Oxygen Therapy: Patient Spontanous Breathing and Patient connected to nasal cannula oxygen  Post-op Assessment: Report given to RN and Post -op Vital signs reviewed and stable  Post vital signs: Reviewed and stable  Last Vitals:  Vitals Value Taken Time  BP 133/59   Temp 98   Pulse 69   Resp 16   SpO2 98     Last Pain:  Vitals:   04/13/23 0949  TempSrc:   PainSc: 0-No pain         Complications: No notable events documented.

## 2023-04-13 NOTE — Interval H&P Note (Signed)
History and Physical Interval Note:  04/13/2023 9:33 AM  [name]  has presented today for surgery, with the diagnosis of AFLUTTER.  The various methods of treatment have been discussed with the patient and family. After consideration of risks, benefits and other options for treatment, the patient has consented to  Procedure(s): CARDIOVERSION (N/A) as a surgical intervention.  The patient's history has been reviewed, patient examined, no change in status, stable for surgery.  I have reviewed the patient's chart and labs.  Questions were answered to the patient's satisfaction.     [employer]

## 2023-04-13 NOTE — Anesthesia Postprocedure Evaluation (Signed)
Anesthesia Post Note  Patient: [name]  Procedure(s) Performed: CARDIOVERSION     Patient location during evaluation: PACU Anesthesia Type: General Level of consciousness: awake and alert, patient cooperative and oriented Pain management: pain level controlled Vital Signs Assessment: post-procedure vital signs reviewed and stable Respiratory status: spontaneous breathing, nonlabored ventilation and respiratory function stable Cardiovascular status: blood pressure returned to baseline and stable Postop Assessment: no apparent nausea or vomiting Anesthetic complications: no   No notable events documented.  Last Vitals:  Vitals:   04/13/23 0933 04/13/23 0949  BP: (!) 140/62 (!) 152/66  Pulse: 77 77  Resp:  20  Temp: 37.3 C   SpO2: 98% 98%    Last Pain:  Vitals:   04/13/23 0949  TempSrc:   PainSc: 0-No pain                 Eldonna Neuenfeldt,E. Jenya Putz

## 2023-04-13 NOTE — Discharge Instructions (Signed)

## 2023-04-13 NOTE — Anesthesia Preprocedure Evaluation (Addendum)
Anesthesia Evaluation  Patient identified by MRN, date of birth, ID band Patient awake    Reviewed: Allergy & Precautions, H&P , NPO status , Patient's Chart, lab work & pertinent test results  History of Anesthesia Complications Negative for: history of anesthetic complications  Airway Mallampati: II  TM Distance: >3 FB Neck ROM: full    Dental  (+) Dental Advisory Given, Poor Dentition, Missing   Pulmonary former smoker   breath sounds clear to auscultation       Cardiovascular hypertension, + CAD (minimal, non-obstructive) and +CHF  + dysrhythmias Atrial Fibrillation + Valvular Problems/Murmurs AS and MR  Rhythm:irregular Rate:Normal + Systolic murmurs Severe MR, severe TR, mild-mod AS.   Neuro/Psych   Anxiety Depression Bipolar Disorder   negative neurological ROS     GI/Hepatic negative GI ROS, Neg liver ROS,,,  Endo/Other  negative endocrine ROS    Renal/GU ESRF and DialysisRenal disease     Musculoskeletal  (+) Arthritis ,    Abdominal   Peds  Hematology   Anesthesia Other Findings   Reproductive/Obstetrics                             Anesthesia Physical Anesthesia Plan  ASA: 4  Anesthesia Plan: General   Post-op Pain Management: Minimal or no pain anticipated   Induction: Intravenous  PONV Risk Score and Plan: 1 and Propofol infusion and Treatment may vary due to age or medical condition  Airway Management Planned: Mask and Natural Airway  Additional Equipment: None  Intra-op Plan:   Post-operative Plan:   Informed Consent: I have reviewed the patients History and Physical, chart, labs and discussed the procedure including the risks, benefits and alternatives for the proposed anesthesia with the patient or authorized representative who has indicated his/her understanding and acceptance.     Dental advisory given  Plan Discussed with: CRNA, Anesthesiologist and  Surgeon  Anesthesia Plan Comments:        Anesthesia Quick Evaluation

## 2023-04-14 ENCOUNTER — Encounter (HOSPITAL_COMMUNITY): Payer: Self-pay | Admitting: Cardiology

## 2023-04-18 ENCOUNTER — Ambulatory Visit (HOSPITAL_COMMUNITY): Payer: Medicare HMO | Attending: Thoracic Surgery (Cardiothoracic Vascular Surgery)

## 2023-04-18 DIAGNOSIS — I340 Nonrheumatic mitral (valve) insufficiency: Secondary | ICD-10-CM | POA: Insufficient documentation

## 2023-04-18 LAB — ECHOCARDIOGRAM COMPLETE
AR max vel: 2.63 cm2
AV Area VTI: 2.74 cm2
AV Area mean vel: 2.58 cm2
AV Mean grad: 15.6 mmHg
AV Peak grad: 27.8 mmHg
Ao pk vel: 2.64 m/s
Area-P 1/2: 3.37 cm2
MV M vel: 4.96 m/s
MV Peak grad: 98.5 mmHg
MV VTI: 4.11 cm2
P 1/2 time: 365 msec
S' Lateral: 3.3 cm

## 2023-04-23 NOTE — Progress Notes (Unsigned)
Cardiology Office Note:   Date:  04/25/2023  NAME:  [name]    MRN: [id number] DOB:  1971-07-04   PCP:  Donne Anon, MD  Cardiologist:  None  Electrophysiologist:  None   Referring MD: Donne Anon, MD   Chief Complaint  Patient presents with   Follow-up         History of Present Illness:   [name] is a 52 y.o. male with a below medical history who presents for follow-up.  EKG she is maintaining sinus rhythm.  Volume status is maintained by hemodialysis.  His repeat echo mentions mild aortic stenosis.  He has had moderate aortic stenosis for quite some time.  I did discuss that he needs aortic valve replacement, mitral valve replacement/repair, tricuspid valve repair.  He will plan for cardiac surgery with Dr. Leafy Ro in early August.  We also discussed that long-term anticoagulation is not beneficial to him.  He has had anemia and issues with bleeding.  Given his chronic anemia I have only recommended 4 weeks of anticoagulation after his cardioversion procedure.  We will ask for flutter ablation and left atrial appendage clipping at the time of surgery.  No significant chest pain or shortness of breath reported today.  Overall seems to be doing well after his cardioversion.  Problem List  1. 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 HTN 3. Perianal condyloma  4. Moderate aortic stenosis 5. Chronic anemia  6. HLD -T chol 67, HDL 28, LDL 24, TG 83 7. Systolic HF, 45-50% 8. Severe pulmonary hypertension  -mPAP 40 mmHG -PCWP 28 mmHG -TPG 13 mmHG, 2.7 WU 9. Severe mitral valve regurgitation  -flail A1 segment 10. Severe tricuspid regurgitation  11. OSA -only wears O2 at night/cannot tolerate CPAP 12. 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 Atrial flutter  -Dx 03/16/2023 -DCCV 04/13/2023 -CHADSVASC = 2 (Age, CHF) -no long term AC due to anemia   Past Medical History: Past Medical History:  Diagnosis Date   ADHD (attention deficit hyperactivity disorder)     Anemia    Anemia, chronic disease 07/13/2011   Anxiety    Arthritis    Bipolar 2 disorder (HCC) 08/18/2011   Diagnosed in 2011.    Bipolar disorder (HCC)    Blood transfusion    Blood transfusion without reported diagnosis    Chronic diastolic CHF (congestive heart failure) (HCC)    Complication of anesthesia    Woke up intubated gets combative  afraid to be alone    Congestive heart failure (CHF) (HCC) 07/13/2011   Onset 2006.  Followed by Dr. Eden Emms.  S/p cardiac catheterization in [facility] Dr. Anne Fu..  S/p cardiac catheterization in 2014 by Nishan.  Echo 2014.    Depression    Erectile dysfunction    ESRD on hemodialysis (HCC) 07/13/2011   Glomerulonephritis at age 20, started HD in 33.  Deceased donor renal transplant 1999 at [facility], then kidney failed and went back on HD in 2006.  L forearm AVF . s/p repeat renal transplant 10/2014 [facility].   GI bleed 11/01/2011   Rectal bleeding with emesis/diarrhea    Hemodialysis status (HCC)    Hydrocele, right 11/25/2014   Hypertension    Hypertensive emergency 01/06/2013   Hypoglycemia 01/11/2013   Influenza-like illness 12/18/2013   Mild ascending aorta dilatation (HCC)    NICM (nonischemic cardiomyopathy) (HCC)    Pericardial effusion    a. Mod by echo in 2014, similar to prior.   Peripheral polyneuropathy 08/04/2016  Pulmonary edema 01/06/2013   Pulmonary hypertension associated with end stage renal disease on dialysis [facility]) 07/13/2011   Renal transplant, status post 11/25/2014   Pt with Glomerulonephritis at age 55. Deceased donor renal transplant 1999 right and left renal transplant 10/2014.  Followed by [facility] transplant team; nephrologist is Dr. Lonna Cobb.    Respiratory failure with hypoxia (HCC) 01/06/2013   Shortness of breath 12/12/2012   Swollen testicle 07/13/2011   URI (upper respiratory infection) 01/25/2012    Past Surgical History: Past Surgical History:  Procedure Laterality Date   ANGIOPLASTY     BUBBLE STUDY  09/05/2022    Procedure: BUBBLE STUDY;  Surgeon: Sande Rives, MD;  Location: [facility] ENDOSCOPY;  Service: Cardiovascular;;   CARDIOVERSION N/A 04/13/2023   Procedure: CARDIOVERSION;  Surgeon: Jake Bathe, MD;  Location: [facility] INVASIVE CV LAB;  Service: Cardiovascular;  Laterality: N/A;   CHOLECYSTECTOMY     DIALYSIS FISTULA CREATION     EVALUATION UNDER ANESTHESIA WITH HEMORRHOIDECTOMY N/A 03/15/2020   Procedure: CONTROL OF ANAL BLEEDING;  Surgeon: Andria Meuse, MD;  Location: [facility] OR;  Service: General;  Laterality: N/A;   KIDNEY TRANSPLANT  10/17/14   KNEE SURGERY     LEFT HEART CATHETERIZATION WITH CORONARY ANGIOGRAM N/A 10/01/2013   Procedure: LEFT HEART CATHETERIZATION WITH CORONARY ANGIOGRAM;  Surgeon: Wendall Stade, MD;  Location: [facility] CATH LAB;  Service: Cardiovascular;  Laterality: N/A;   MOUTH SURGERY     NEPHRECTOMY TRANSPLANTED ORGAN     RIGHT/LEFT HEART CATH AND CORONARY ANGIOGRAPHY N/A 11/24/2022   Procedure: RIGHT/LEFT HEART CATH AND CORONARY ANGIOGRAPHY;  Surgeon: Kathleene Hazel, MD;  Location: [facility] INVASIVE CV LAB;  Service: Cardiovascular;  Laterality: N/A;   TEE WITHOUT CARDIOVERSION N/A 09/05/2022   Procedure: TRANSESOPHAGEAL ECHOCARDIOGRAM (TEE);  Surgeon: Sande Rives, MD;  Location: [facility] ENDOSCOPY;  Service: Cardiovascular;  Laterality: N/A;    Current Medications: Current Meds  Medication Sig   acetaminophen (TYLENOL) 500 MG tablet Take 1,000 mg by mouth daily as needed for moderate pain.   acyclovir (ZOVIRAX) 200 MG capsule Take 200 mg by mouth 2 (two) times daily.   amLODipine (NORVASC) 2.5 MG tablet Take 1 tablet (2.5 mg total) by mouth daily. (Patient taking differently: Take 2.5 mg by mouth See admin instructions. Take 2.5 mg on Tuesday and Thursday, Saturday and Sunday once a day in the morning)   amphetamine-dextroamphetamine (ADDERALL XR) 20 MG 24 hr capsule Take 20 mg by mouth every morning.   amphetamine-dextroamphetamine (ADDERALL) 10 MG tablet  Take 10 mg by mouth every evening.   apixaban (ELIQUIS) 5 MG TABS tablet Take 1 tablet (5 mg total) by mouth 2 (two) times daily.   atorvastatin (LIPITOR) 10 MG tablet Take 1 tablet (10 mg total) by mouth daily. (Patient taking differently: Take 10 mg by mouth at bedtime.)   bismuth subsalicylate (PEPTO BISMOL) 262 MG chewable tablet Chew 524 mg by mouth as needed for indigestion.   calcium carbonate (TUMS EX) 750 MG chewable tablet Chew 1,500 mg by mouth See admin instructions. Take 1500 mg twice daily with snacks   carvedilol (COREG) 12.5 MG tablet Take 12.5 mg by mouth See admin instructions. Take 12.5 mg on Monday,Wednesday and Friday once a day in the morning   carvedilol (COREG) 25 MG tablet Take 1 tablet (25 mg total) by mouth 2 times daily with meals. (Patient taking differently: Take 25 mg by mouth See admin instructions. Take 25 mg twice a day on Tuesday,Thursday, Saturday and Sunday)  clobetasol ointment (TEMOVATE) 0.05 % Apply 1 Application topically 2 (two) times daily as needed (rash).   clonazePAM (KLONOPIN) 0.5 MG tablet Take 1 mg by mouth See admin instructions. Take 1 mg Monday, Wednesday and Friday in the evening   HYDROcodone-acetaminophen (NORCO) 7.5-325 MG tablet Take 1 tablet by mouth 3 (three) times daily as needed for severe pain or moderate pain.   lidocaine-prilocaine (EMLA) cream Apply 1 Application topically every Monday, Wednesday, and Friday. One hour prior to Dialysis   sevelamer carbonate (RENVELA) 0.8 g PACK packet Take 0.8-1.6 g by mouth 3 (three) times daily with meals.     Allergies:    Delacort [hydrocortisone base]; Benicar [olmesartan]; Diltiazem; Food; Hydralazine; Nitrates, organic; and Psyllium   Social History: Social History   Socioeconomic History   Marital status: Single    Spouse name: Not on file   Number of children: 0   Years of education: 20+   Highest education level: Not on file  Occupational History   Occupation: Chef- fulltime   Tobacco Use   Smoking status: Former    Types: Cigars   Smokeless tobacco: Never  Building services engineer Use: Never used  Substance and Sexual Activity   Alcohol use: No    Alcohol/week: 1.0 standard drink of alcohol    Types: 1 Standard drinks or equivalent per week    Comment: Stopped drinking after Kidney transplant in December 2015   Drug use: No   Sexual activity: Yes  Other Topics Concern   Not on file  Social History Narrative   Marital status: single; dating      Children; none      Lives: alone      Employment: Investment banker, operational at hotel; 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      Tobacco: none      Alcohol: none      Drugs: none      Exercise: cycling; 3 days per week.      Sexual activity: females only; no male encounters; Chlamydia age 51.  Total partner = 55.  Condoms.      Caffeine use: Soda sometimes   Social Determinants of Corporate investment banker Strain: Not on file  Food Insecurity: Not on file  Transportation Needs: Not on file  Physical Activity: Not on file  Stress: Not on file  Social Connections: Not on file     Family History: The patient's family history includes Arthritis in his father and mother; Depression in his father and mother; Diabetes in his father and mother; Hypertension in his father and mother.  ROS:   All other ROS reviewed and negative. Pertinent positives noted in the HPI.     EKGs/Labs/Other Studies Reviewed:   The following studies were personally reviewed by me today:  EKG:  EKG is ordered today.   EKG Interpretation  Date/Time:  Tuesday April 25 2023 09:18:48 EDT Ventricular Rate:  75 PR Interval:  236 QRS Duration: 106 QT Interval:  416 QTC Calculation: 464 R Axis:   216 Text Interpretation: Normal sinus rhythm Rightward axis Non-specific ST-t changes Confirmed by Lennie Odor [phone]) on 04/25/2023 9:35:08 AM   Recent Labs: 11/24/2022: BUN 35; Creatinine, Ser 8.05; Hemoglobin 12.6; Platelets  202; Potassium 4.9; Sodium 138   Recent Lipid Panel    Component Value Date/Time   CHOL  11/04/2007 0424    83        ATP III CLASSIFICATION:  <200  mg/dL   Desirable  621-308  mg/dL   Borderline High  >=657    mg/dL   High   TRIG 60 84/69/6295 0424   HDL 20 (L) 11/04/2007 0424   CHOLHDL 4.2 11/04/2007 0424   VLDL 12 11/04/2007 0424   LDLCALC  11/04/2007 0424    51        Total Cholesterol/HDL:CHD Risk Coronary Heart Disease Risk Table                     Men   Women  1/2 Average Risk   3.4   3.3    Physical Exam:   VS:  Pulse 76   Ht 6\' 3"  (1.905 m)   Wt 217 lb 9.6 oz (98.7 kg)   SpO2 97%   BMI 27.20 kg/m    Wt Readings from Last 3 Encounters:  04/25/23 217 lb 9.6 oz (98.7 kg)  04/13/23 208 lb (94.3 kg)  03/16/23 215 lb 3.2 oz (97.6 kg)    General: Well nourished, well developed, in no acute distress Head: Atraumatic, normal size  Eyes: PEERLA, EOMI  Neck: Supple, no JVD Endocrine: No thryomegaly Cardiac: Normal S1, S2; 3 out of 6 systolic ejection murmur Lungs: Clear to auscultation bilaterally, no wheezing, rhonchi or rales  Abd: Soft, nontender, no hepatomegaly  Ext: 1+ pitting edema Musculoskeletal: No deformities, BUE and BLE strength normal and equal Skin: Warm and dry, no rashes   Neuro: Alert and oriented to person, place, time, and situation, CNII-XII grossly intact, no focal deficits  Psych: Normal mood and affect   ASSESSMENT:   [name] is a 52 y.o. male who presents for the following: 1. Typical atrial flutter (HCC)   2. Nonrheumatic mitral valve regurgitation   3. Nonrheumatic tricuspid valve regurgitation   4. Nonrheumatic aortic valve stenosis   5. Pulmonary hypertension, unspecified (HCC)   6. Chronic systolic heart failure (HCC)   7. Coronary artery disease involving native coronary artery of native heart without angina pectoris   8. Mixed hyperlipidemia     PLAN:   1. Typical atrial flutter (HCC) -Status post cardioversion for  atrial flutter.  Back in sinus rhythm today.  He will complete 4 weeks of anticoagulation and can stop on 05/13/2023.  We did discuss that he is not a good candidate for long-term anticoagulation due to anemia.  Would recommend to stop after 4 weeks.  We will ask for Maze procedure with left atrial appendage clipping at the time of surgery.  2. Nonrheumatic mitral valve regurgitation 3. Nonrheumatic tricuspid valve regurgitation 4. Nonrheumatic aortic valve stenosis -Moderate aortic stenosis.  Most recent echo shows mild however he has had moderate disease for quite some time.  Would recommend aortic valve replacement at time of surgery.  Severe mitral valve regurgitation.  Needs repair versus replacement.  Also with severe tricuspid regurgitation.  Need tricuspid valve repair.  He will have surgery with Dr. Leafy Ro in August.  5. Pulmonary hypertension, unspecified (HCC) -His pulmonary hypertension is likely group 2.  His transpulmonary gradient is low.  I suspect this is related to mitral valve disease and heart failure.  This does not preclude him from heart surgery.  6. Chronic systolic heart failure (HCC) -EF has improved to 55-60%.  Currently on carvedilol.  Has had issues with blood pressure.  Not on other GDMT.  EF has improved.  7. Coronary artery disease involving native coronary artery of native heart without angina pectoris 8. Mixed hyperlipidemia -Minimal nonobstructive  disease.  Continue preventive medicines.  Disposition: Return in about 4 months (around 08/25/2023).  Medication Adjustments/Labs and Tests Ordered: Current medicines are reviewed at length with the patient today.  Concerns regarding medicines are outlined above.  Orders Placed This Encounter  Procedures   EKG 12-Lead   No orders of the defined types were placed in this encounter.   Patient Instructions  Medication Instructions:  STOP Eliquis on 05/13/2023  *If you need a refill on your cardiac medications  before your next appointment, please call your pharmacy*   Follow-Up: At [facility], you and your health needs are our priority.  As part of our continuing mission to provide you with exceptional heart care, we have created designated Provider Care Teams.  These Care Teams include your primary Cardiologist (physician) and Advanced Practice Providers (APPs -  Physician Assistants and Nurse Practitioners) who all work together to provide you with the care you need, when you need it.  We recommend signing up for the patient portal called "MyChart".  Sign up information is provided on this After Visit Summary.  MyChart is used to connect with patients for Virtual Visits (Telemedicine).  Patients are able to view lab/test results, encounter notes, upcoming appointments, etc.  Non-urgent messages can be sent to your provider as well.   To learn more about what you can do with MyChart, go to [email or web address].    Your next appointment:   4 month(s) after your surgery   Provider:   Lennie Odor, MD     Time Spent with Patient: I have spent a total of 35 minutes with patient reviewing hospital notes, telemetry, EKGs, labs and examining the patient as well as establishing an assessment and plan that was discussed with the patient.  > 50% of time was spent in direct patient care.  Signed, Lenna Gilford. Flora Lipps, MD, [facility]  [facility]  [address], [address] [place], [place] [address] [phone]  04/25/2023 10:06 AM

## 2023-04-25 ENCOUNTER — Encounter: Payer: Self-pay | Admitting: Cardiovascular Disease

## 2023-04-25 ENCOUNTER — Ambulatory Visit: Payer: Medicare HMO | Attending: Cardiovascular Disease | Admitting: Cardiovascular Disease

## 2023-04-25 VITALS — HR 76 | Ht 75.0 in | Wt 217.6 lb

## 2023-04-25 DIAGNOSIS — I2510 Atherosclerotic heart disease of native coronary artery without angina pectoris: Secondary | ICD-10-CM

## 2023-04-25 DIAGNOSIS — I350 Nonrheumatic aortic (valve) stenosis: Secondary | ICD-10-CM | POA: Diagnosis not present

## 2023-04-25 DIAGNOSIS — I361 Nonrheumatic tricuspid (valve) insufficiency: Secondary | ICD-10-CM

## 2023-04-25 DIAGNOSIS — I340 Nonrheumatic mitral (valve) insufficiency: Secondary | ICD-10-CM | POA: Diagnosis not present

## 2023-04-25 DIAGNOSIS — E782 Mixed hyperlipidemia: Secondary | ICD-10-CM

## 2023-04-25 DIAGNOSIS — I5022 Chronic systolic (congestive) heart failure: Secondary | ICD-10-CM

## 2023-04-25 DIAGNOSIS — I483 Typical atrial flutter: Secondary | ICD-10-CM

## 2023-04-25 DIAGNOSIS — I272 Pulmonary hypertension, unspecified: Secondary | ICD-10-CM

## 2023-04-25 NOTE — Patient Instructions (Signed)
Medication Instructions:  STOP Eliquis on 05/13/2023  *If you need a refill on your cardiac medications before your next appointment, please call your pharmacy*   Follow-Up: At [facility], you and your health needs are our priority.  As part of our continuing mission to provide you with exceptional heart care, we have created designated Provider Care Teams.  These Care Teams include your primary Cardiologist (physician) and Advanced Practice Providers (APPs -  Physician Assistants and Nurse Practitioners) who all work together to provide you with the care you need, when you need it.  We recommend signing up for the patient portal called "MyChart".  Sign up information is provided on this After Visit Summary.  MyChart is used to connect with patients for Virtual Visits (Telemedicine).  Patients are able to view lab/test results, encounter notes, upcoming appointments, etc.  Non-urgent messages can be sent to your provider as well.   To learn more about what you can do with MyChart, go to [email or web address].    Your next appointment:   4 month(s) after your surgery   Provider:   Lennie Odor, MD

## 2023-05-04 ENCOUNTER — Ambulatory Visit: Payer: Medicare HMO | Admitting: Cardiovascular Disease

## 2023-05-22 ENCOUNTER — Ambulatory Visit: Payer: Medicare HMO | Admitting: Thoracic Surgery (Cardiothoracic Vascular Surgery)

## 2023-05-22 ENCOUNTER — Telehealth: Payer: Self-pay | Admitting: Cardiovascular Disease

## 2023-05-22 NOTE — Telephone Encounter (Signed)
Spoke with pt regarding his question about Eliquis. Per Dr. Marylene Buerger office note from 04/25/23 does state that it is ok for pt to stop taking Eliquis on 05/13/23. Pt states that he did stop on this date and he was just making sure. Confirmed that it is ok. Pt verbalizes understanding.

## 2023-05-22 NOTE — Telephone Encounter (Signed)
Pt c/o medication issue:  1. Name of Medication:  Eliquis  2. How are you currently taking this medication (dosage and times per day)?   3. Are you having a reaction (difficulty breathing--STAT)?   4. What is your medication issue?   Patient would like to confirm that he was supposed to stop Eliquis on 7/06. Please advise.

## 2023-05-23 NOTE — Progress Notes (Unsigned)
[address]       [place] [address]             [phone]           Matson Welch [facility] Medical Record #[id number] Date of Birth: [birth year]  Prescilla Sours, MD  Chief Complaint: follow up valvular surgery scheduling    History of Present Illness:     Pt known to me from earlier consult and we had planned his surgery later in August. Pt here to discuss plans and recovery.      Past Medical History:  Diagnosis Date   ADHD (attention deficit hyperactivity disorder)    Anemia    Anemia, chronic disease 07/13/2011   Anxiety    Arthritis    Bipolar 2 disorder (HCC) 08/18/2011   Diagnosed in 2011.    Bipolar disorder (HCC)    Blood transfusion    Blood transfusion without reported diagnosis    Chronic diastolic CHF (congestive heart failure) (HCC)    Complication of anesthesia    Woke up intubated gets combative  afraid to be alone    Congestive heart failure (CHF) (HCC) 07/13/2011   Onset 2006.  Followed by Dr. Eden Emms.  S/p cardiac catheterization in [facility] Dr. Anne Fu..  S/p cardiac catheterization in 2014 by Nishan.  Echo 2014.    Depression    Erectile dysfunction    ESRD on hemodialysis (HCC) 07/13/2011   Glomerulonephritis at age 52, started HD in 52.  Deceased donor renal transplant 1999 at [facility], then kidney failed and went back on HD in 2006.  L forearm AVF . s/p repeat renal transplant 10/2014 [facility].   GI bleed 11/01/2011   Rectal bleeding with emesis/diarrhea    Hemodialysis status (HCC)    Hydrocele, right 11/25/2014   Hypertension    Hypertensive emergency 01/06/2013   Hypoglycemia 01/11/2013   Influenza-like illness 12/18/2013   Mild ascending aorta dilatation (HCC)    NICM (nonischemic cardiomyopathy) (HCC)    Pericardial effusion    a. Mod by echo in 2014, similar to prior.   Peripheral polyneuropathy 08/04/2016   Pulmonary edema 01/06/2013   Pulmonary hypertension associated with end stage renal disease on  dialysis [facility]) 07/13/2011   Renal transplant, status post 11/25/2014   Pt with Glomerulonephritis at age 52. Deceased donor renal transplant 1999 right and left renal transplant 10/2014.  Followed by [facility] transplant team; nephrologist is Dr. Lonna Cobb.    Respiratory failure with hypoxia (HCC) 01/06/2013   Shortness of breath 12/12/2012   Swollen testicle 07/13/2011   URI (upper respiratory infection) 01/25/2012    Past Surgical History:  Procedure Laterality Date   ANGIOPLASTY     BUBBLE STUDY  09/05/2022   Procedure: BUBBLE STUDY;  Surgeon: Sande Rives, MD;  Location: [facility] ENDOSCOPY;  Service: Cardiovascular;;   CARDIOVERSION N/A 04/13/2023   Procedure: CARDIOVERSION;  Surgeon: Jake Bathe, MD;  Location: [facility] INVASIVE CV LAB;  Service: Cardiovascular;  Laterality: N/A;   CHOLECYSTECTOMY     DIALYSIS FISTULA CREATION     EVALUATION UNDER ANESTHESIA WITH HEMORRHOIDECTOMY N/A 03/15/2020   Procedure: CONTROL OF ANAL BLEEDING;  Surgeon: Andria Meuse, MD;  Location: [facility] OR;  Service: General;  Laterality: N/A;   KIDNEY TRANSPLANT  10/17/14   KNEE SURGERY     LEFT HEART CATHETERIZATION WITH CORONARY ANGIOGRAM N/A 10/01/2013   Procedure: LEFT HEART CATHETERIZATION WITH CORONARY ANGIOGRAM;  Surgeon: Wendall Stade, MD;  Location: [facility] CATH LAB;  Service: Cardiovascular;  Laterality: N/A;   MOUTH SURGERY     NEPHRECTOMY TRANSPLANTED ORGAN     RIGHT/LEFT HEART CATH AND CORONARY ANGIOGRAPHY N/A 11/24/2022   Procedure: RIGHT/LEFT HEART CATH AND CORONARY ANGIOGRAPHY;  Surgeon: Kathleene Hazel, MD;  Location: [facility] INVASIVE CV LAB;  Service: Cardiovascular;  Laterality: N/A;   TEE WITHOUT CARDIOVERSION N/A 09/05/2022   Procedure: TRANSESOPHAGEAL ECHOCARDIOGRAM (TEE);  Surgeon: Sande Rives, MD;  Location: [facility] ENDOSCOPY;  Service: Cardiovascular;  Laterality: N/A;    Social History   Tobacco Use  Smoking Status Former   Types: Cigars  Smokeless Tobacco Never    Social  History   Substance and Sexual Activity  Alcohol Use No   Alcohol/week: 1.0 standard drink of alcohol   Types: 1 Standard drinks or equivalent per week   Comment: Stopped drinking after Kidney transplant in December 2015    Social History   Socioeconomic History   Marital status: Single    Spouse name: Not on file   Number of children: 0   Years of education: 20+   Highest education level: Not on file  Occupational History   Occupation: Chef- fulltime  Tobacco Use   Smoking status: Former    Types: Cigars   Smokeless tobacco: Never  Vaping Use   Vaping status: Never Used  Substance and Sexual Activity   Alcohol use: No    Alcohol/week: 1.0 standard drink of alcohol    Types: 1 Standard drinks or equivalent per week    Comment: Stopped drinking after Kidney transplant in December 2015   Drug use: No   Sexual activity: Yes  Other Topics Concern   Not on file  Social History Narrative   Marital status: single; dating      Children; none      Lives: alone      Employment: Investment banker, operational at hotel; 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      Tobacco: none      Alcohol: none      Drugs: none      Exercise: cycling; 3 days per week.      Sexual activity: females only; no male encounters; Chlamydia age 52.  Total partner = 50.  Condoms.      Caffeine use: Soda sometimes   Social Determinants of Corporate investment banker Strain: Not on file  Food Insecurity: Low Risk  (02/20/2023)   [facility]   Food vital sign    Within the past 12 months, you worried that your food would run out before you got money to buy more: Never true    Within the past 12 months, the food you bought just didn't last and you didn't have money to get more. : Never true  Transportation Needs: No Transportation Needs (02/20/2023)   [facility]   Transportation    In the past 12 months, has lack of reliable  transportation kept you from medical appointments, meetings, work or from getting things needed for daily living? : No  Physical Activity: Not on file  Stress: Not on file  Social Connections: Unknown (03/18/2022)   [facility]   Social Network    Social Network: Not on file  Intimate Partner Violence: Unknown (02/07/2022)   [facility] from [facility]   HITS    Physically Hurt: Not on file    Insult or  Talk Down To: Not on file    Threaten Physical Harm: Not on file    Scream or Curse: Not on file    Allergies  Allergen Reactions   Delacort [Hydrocortisone Base] Nausea Only   Benicar [Olmesartan]     Unknown   Diltiazem Other (See Comments)    Tachycardia   Food Itching    bananas   Hydralazine Other (See Comments)    Tachycardia   Nitrates, Organic Other (See Comments)    Palpitations, headache   Psyllium Other (See Comments)    Unknown    Current Outpatient Medications  Medication Sig Dispense Refill   acetaminophen (TYLENOL) 500 MG tablet Take 1,000 mg by mouth daily as needed for moderate pain.     acyclovir (ZOVIRAX) 200 MG capsule Take 200 mg by mouth 2 (two) times daily.     amLODipine (NORVASC) 2.5 MG tablet Take 1 tablet (2.5 mg total) by mouth daily. (Patient taking differently: Take 2.5 mg by mouth See admin instructions. Take 2.5 mg on Tuesday and Thursday, Saturday and Sunday once a day in the morning) 90 tablet 3   amphetamine-dextroamphetamine (ADDERALL XR) 20 MG 24 hr capsule Take 20 mg by mouth every morning.     amphetamine-dextroamphetamine (ADDERALL) 10 MG tablet Take 10 mg by mouth every evening.     atorvastatin (LIPITOR) 10 MG tablet Take 1 tablet (10 mg total) by mouth daily. (Patient taking differently: Take 10 mg by mouth at bedtime.) 90 tablet 3   bismuth subsalicylate (PEPTO BISMOL) 262 MG chewable tablet Chew 524 mg by mouth as needed for indigestion.     calcium carbonate (TUMS EX) 750 MG chewable  tablet Chew 1,500 mg by mouth See admin instructions. Take 1500 mg twice daily with snacks     carvedilol (COREG) 12.5 MG tablet Take 12.5 mg by mouth See admin instructions. Take 12.5 mg on Monday,Wednesday and Friday once a day in the morning     carvedilol (COREG) 25 MG tablet Take 1 tablet (25 mg total) by mouth 2 times daily with meals. (Patient taking differently: Take 25 mg by mouth See admin instructions. Take 25 mg twice a day on Tuesday,Thursday, Saturday and Sunday) 180 tablet 2   clobetasol ointment (TEMOVATE) 0.05 % Apply 1 Application topically 2 (two) times daily as needed (rash).     clonazePAM (KLONOPIN) 0.5 MG tablet Take 1 mg by mouth See admin instructions. Take 1 mg Monday, Wednesday and Friday in the evening     HYDROcodone-acetaminophen (NORCO) 7.5-325 MG tablet Take 1 tablet by mouth 3 (three) times daily as needed for severe pain or moderate pain.     lidocaine-prilocaine (EMLA) cream Apply 1 Application topically every Monday, Wednesday, and Friday. One hour prior to Dialysis     sevelamer carbonate (RENVELA) 0.8 g PACK packet Take 0.8-1.6 g by mouth 3 (three) times daily with meals.     No current facility-administered medications for this visit.     Family History  Problem Relation Age of Onset   Arthritis Mother    Hypertension Mother    Diabetes Mother    Depression Mother    Arthritis Father    Hypertension Father    Diabetes Father    Depression Father        Physical Exam: defferred     Diagnostic Studies & Laboratory data: I have personally reviewed the following studies and agree with the findings   TTE (04/2023) IMPRESSIONS     1. Left ventricular ejection  fraction, by estimation, is 55 to 60%. The  left ventricle has normal function. The left ventricle has no regional  wall motion abnormalities. Left ventricular diastolic parameters are  consistent with Grade III diastolic  dysfunction (restrictive).   2. Right ventricular systolic  function is low normal. The right  ventricular size is moderately enlarged. There is moderately elevated  pulmonary artery systolic pressure. The estimated right ventricular  systolic pressure is 47.5 mmHg.   3. Left atrial size was severely dilated.   4. Right atrial size was severely dilated.   5. The mitral valve is normal in structure. Moderate to severe mitral  valve regurgitation. No evidence of mitral stenosis.   6. Tricuspid valve regurgitation is severe.   7. The aortic valve is normal in structure. There is moderate  calcification of the aortic valve. There is mild thickening of the aortic  valve. Aortic valve regurgitation is trivial. Mild aortic valve stenosis.  Aortic valve area, by VTI measures 2.74  cm. Aortic valve mean gradient measures 15.6 mmHg.   8. The inferior vena cava is dilated in size at 4.3cm with <50%  respiratory variability, suggesting right atrial pressure of 15 mmHg.   FINDINGS   Left Ventricle: Left ventricular ejection fraction, by estimation, is 55  to 60%. The left ventricle has normal function. The left ventricle has no  regional wall motion abnormalities. The left ventricular internal cavity  size was normal in size. There is   no left ventricular hypertrophy. Left ventricular diastolic parameters  are consistent with Grade III diastolic dysfunction (restrictive).   Right Ventricle: The right ventricular size is moderately enlarged. No  increase in right ventricular wall thickness. Right ventricular systolic  function is low normal. There is moderately elevated pulmonary artery  systolic pressure. The tricuspid  regurgitant velocity is 2.85 m/s, and with an assumed right atrial  pressure of 15 mmHg, the estimated right ventricular systolic pressure is  47.5 mmHg.   Left [facility]: Left atrial size was severely dilated.   Right [facility]: Right atrial size was severely dilated.   Pericardium: There is no evidence of pericardial effusion.   Mitral  Valve: The mitral valve is normal in structure. Moderate to severe  mitral valve regurgitation. No evidence of mitral valve stenosis. MV peak  gradient, 12.0 mmHg. The mean mitral valve gradient is 3.0 mmHg.   Tricuspid Valve: The tricuspid valve is normal in structure. Tricuspid  valve regurgitation is severe. No evidence of tricuspid stenosis.   Aortic Valve: The aortic valve is normal in structure. There is moderate  calcification of the aortic valve. There is mild thickening of the aortic  valve. Aortic valve regurgitation is trivial. Aortic regurgitation PHT  measures 365 msec. Mild aortic  stenosis is present. Aortic valve mean gradient measures 15.6 mmHg. Aortic  valve peak gradient measures 27.8 mmHg. Aortic valve area, by VTI measures  2.74 cm.   Pulmonic Valve: The pulmonic valve was normal in structure. Pulmonic valve  regurgitation is not visualized. No evidence of pulmonic stenosis.   Aorta: The aortic root is normal in size and structure.   Venous: The inferior vena cava is dilated in size with less than 50%  respiratory variability, suggesting right atrial pressure of 15 mmHg.   IAS/Shunts: No atrial level shunt detected by color flow Doppler.     LEFT VENTRICLE  PLAX 2D  LVIDd:         5.80 cm   Diastology  LVIDs:  3.30 cm   LV e' medial:    10.10 cm/s  LV PW:         1.40 cm   LV E/e' medial:  15.1  LV IVS:        1.00 cm   LV e' lateral:   12.40 cm/s  LVOT diam:     3.00 cm   LV E/e' lateral: 12.3  LV SV:         150  LV SV Index:   67  LVOT Area:     7.07 cm                             3D Volume EF:                           3D EF:        70 %                           LV EDV:       246 ml                           LV ESV:       74 ml                           LV SV:        172 ml   RIGHT VENTRICLE  RV Basal diam:  6.35 cm  RV Mid diam:    6.20 cm  RV S prime:     8.81 cm/s  TAPSE (M-mode): 1.7 cm  RVSP:           47.5 mmHg   LEFT ATRIUM             Index        RIGHT ATRIUM            Index  LA diam:      5.40 cm  2.40 cm/m   RA Pressure: 15.00 mmHg  LA Vol (A2C): 132.0 ml 58.60 ml/m  RA Area:     36.60 cm  LA Vol (A4C): 198.0 ml 87.90 ml/m  RA Volume:   158.00 ml  70.15 ml/m   AORTIC VALVE  AV Area (Vmax):    2.63 cm  AV Area (Vmean):   2.58 cm  AV Area (VTI):     2.74 cm  AV Vmax:           263.80 cm/s  AV Vmean:          182.200 cm/s  AV VTI:            0.547 m  AV Peak Grad:      27.8 mmHg  AV Mean Grad:      15.6 mmHg  LVOT Vmax:         97.97 cm/s  LVOT Vmean:        66.600 cm/s  LVOT VTI:          0.212 m  LVOT/AV VTI ratio: 0.39  AI PHT:            365 msec    AORTA  Ao Root diam: 4.10 cm  Ao Asc diam:  3.75 cm   MITRAL VALVE  TRICUSPID VALVE  MV Area (PHT): cm          TR Peak grad:   32.5 mmHg  MV Area VTI:   4.11 cm     TR Vmax:        285.00 cm/s  MV Peak grad:  12.0 mmHg    Estimated RAP:  15.00 mmHg  MV Mean grad:  3.0 mmHg     RVSP:           47.5 mmHg  MV Vmax:       1.73 m/s  MV Vmean:      76.0 cm/s    SHUNTS  MV Decel Time: 225 msec     Systemic VTI:  0.21 m  MR Peak grad: 98.5 mmHg     Systemic Diam: 3.00 cm  MR Mean grad: 66.0 mmHg  MR Vmax:      496.33 cm/s  MR Vmean:     386.7 cm/s  MV E velocity: 153.00 cm/s  MV A velocity: 60.55 cm/s  MV E/A ratio:  2.53   CATH (11/2022) Conclusion      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   Mild calcified plaque in the mid segment of the large dominant RCA No obstructive disease in the Circumflex or LAD Elevated right heart pressures (RA 25, RV 48/8/25, PA 58/30 mean 40, PCWP 28).  AO 137/79, LV 146/15/21, CO 9.8 L/min, CI 4.45   Severe MR and TR by echo.  Moderate aortic stenosis (peak to peak gradient 14.8, mean gradient 14.6)    Recent Radiology Findings:       Recent Lab Findings:     Assessment / Plan:     52 yo male with NYHA class I-II symptoms of severe MR and TR and atrial fibrillation with mild to  moderate AS. Pt on dialysis for many years. We discussed the issues surrounding his aortic pathology and need for replacement and consequences of tissue valve at his age and dialysis dependancy. Pt would not tolerate anticoagulation historically. Pt wishes for me to decide and we will plan on further discussing with Dr Scharlene Gloss prior to surgery   I have spent 30 min in review of the records, viewing studies and in face to face with patient and in coordination of future care    Eugenio Hoes 05/23/2023 12:50 PM

## 2023-05-24 ENCOUNTER — Encounter: Payer: Self-pay | Admitting: Thoracic Surgery (Cardiothoracic Vascular Surgery)

## 2023-05-24 ENCOUNTER — Encounter: Payer: Self-pay | Admitting: *Deleted

## 2023-05-24 ENCOUNTER — Other Ambulatory Visit: Payer: Self-pay | Admitting: *Deleted

## 2023-05-24 ENCOUNTER — Other Ambulatory Visit: Payer: Self-pay | Admitting: Thoracic Surgery (Cardiothoracic Vascular Surgery)

## 2023-05-24 ENCOUNTER — Ambulatory Visit: Payer: Medicare HMO | Admitting: Thoracic Surgery (Cardiothoracic Vascular Surgery)

## 2023-05-24 VITALS — BP 83/48 | HR 71 | Resp 18 | Ht 75.0 in | Wt 222.0 lb

## 2023-05-24 DIAGNOSIS — I4891 Unspecified atrial fibrillation: Secondary | ICD-10-CM

## 2023-05-24 DIAGNOSIS — I340 Nonrheumatic mitral (valve) insufficiency: Secondary | ICD-10-CM

## 2023-05-24 DIAGNOSIS — I071 Rheumatic tricuspid insufficiency: Secondary | ICD-10-CM

## 2023-05-24 NOTE — Patient Instructions (Signed)
Surgery August 15

## 2023-05-29 ENCOUNTER — Encounter: Payer: Self-pay | Admitting: Cardiovascular Disease

## 2023-05-29 ENCOUNTER — Telehealth: Payer: Self-pay | Admitting: Cardiovascular Disease

## 2023-05-29 NOTE — Telephone Encounter (Signed)
Patient stated he currently  receiving dialysis, last week the dialysis tech took his bp prior to treatment it was 200/70.Pt stated the staff did recheck his bp however, he does not know the reading.   Pt stated there are standing orders for  amlodipine  if bp is abnormal, however pt stated he will like an prescription for minoxidil to help manage his bp. While talking to the patient, he stated " you know what I will call nephrology and ask them to help manage my blood pressure". Will forward to MD and nurse.

## 2023-05-29 NOTE — Telephone Encounter (Signed)
Pt c/o BP issue: STAT if pt c/o blurred vision, one-sided weakness or slurred speech  1. What are your last 5 BP readings?   Friday, 7/19 - 200/70 (beginning of treatment, but came down after treatment)  2. Are you having any other symptoms (ex. Dizziness, headache, blurred vision, passed out)?   No  3. What is your BP issue?    Patient stated he has been taking cloNIDine (CATAPRES) tablet 0.3 mg   to bring his BP down and wants to stop taking amLODipine (NORVASC) 2.5 MG tablet to get on a lower dose of Minoxidil.

## 2023-06-05 ENCOUNTER — Encounter: Payer: Self-pay | Admitting: Cardiovascular Disease

## 2023-06-07 NOTE — Telephone Encounter (Signed)
Medication list updated.

## 2023-06-08 NOTE — Patient Instructions (Addendum)
Pt came in for a BP check. He broke out into a sweat after taking his amlodipine. His medication had been increased and he never had this feeling before. BP taken in RLE. 117/78. Pt advised it was a good BP and if he experience the same sweating with chest pain, HA, dizziness or tingling then he needs to go to the ER. Pt verbalized understanding.

## 2023-06-16 ENCOUNTER — Encounter: Payer: Self-pay | Admitting: Thoracic Surgery (Cardiothoracic Vascular Surgery)

## 2023-06-19 NOTE — Pre-Procedure Instructions (Signed)
Surgical Instructions    Your procedure is scheduled on June 22, 2023.  Report to [facility] Main Entrance "A" at 5:30 A.M., then check in with the Admitting office.  Call this number if you have problems the morning of surgery:  [phone]   If you have any questions prior to your surgery date call [phone]: Open Monday-Friday 8am-4pm If you experience any cold or flu symptoms such as cough, fever, chills, shortness of breath, etc. between now and your scheduled surgery, please notify us at the above number     Remember:  Do not eat or drink after midnight the night before your surgery    Take these medicines the morning of surgery with A SIP OF WATER: Acyclovir (Zovirax) Amlodipine (Norvasc) Carvedilol (Coreg)  If needed: Hydrocodone-Acetaminophen (NORCO)  As of today, STOP taking any Aspirin (unless otherwise instructed by your surgeon) Aleve, Naproxen, Ibuprofen, Motrin, Advil, Goody's, BC's, all herbal medications, fish oil, and all vitamins.          Do not wear jewelry. Do not wear lotions, powders, cologne or deodorant. Do not shave 48 hours prior to surgery.  Men may shave face and neck. Do not bring valuables to the hospital.  [facility] is not responsible for any belongings or valuables.    Do NOT Smoke (Tobacco/Vaping)  24 hours prior to your procedure  If you use a CPAP at night, you may bring your mask for your overnight stay.   Contacts, glasses, hearing aids, dentures or partials may not be worn into surgery, please bring cases for these belongings   For patients admitted to the hospital, discharge time will be determined by your treatment team.   Patients discharged the day of surgery will not be allowed to drive home, and someone needs to stay with them for 24 hours.   SURGICAL WAITING ROOM VISITATION Patients having surgery or a procedure may have no more than 2 support people in the waiting area - these visitors may rotate.   Children under  the age of 45 must have an adult with them who is not the patient. If the patient needs to stay at the hospital during part of their recovery, the visitor guidelines for inpatient rooms apply. Pre-op nurse will coordinate an appropriate time for 1 support person to accompany patient in pre-op.  This support person may not rotate.   Please refer to [email or web address] for the visitor guidelines for Inpatients (after your surgery is over and you are in a regular room).    Special instructions:    Oral Hygiene is also important to reduce your risk of infection.  Remember - BRUSH YOUR TEETH THE MORNING OF SURGERY WITH YOUR REGULAR TOOTHPASTE   [place]- Preparing For Surgery  Before surgery, you can play an important role. Because skin is not sterile, your skin needs to be as free of germs as possible. You can reduce the number of germs on your skin by washing with CHG (chlorahexidine gluconate) Soap before surgery.  CHG is an antiseptic cleaner which kills germs and bonds with the skin to continue killing germs even after washing.     Please do not use if you have an allergy to CHG or antibacterial soaps. If your skin becomes reddened/irritated stop using the CHG.  Do not shave (including legs and underarms) for at least 48 hours prior to first CHG shower. It is OK to shave your face.  Please follow these instructions carefully.     Shower the [employer]  BEFORE SURGERY and the MORNING OF SURGERY with CHG Soap.   If you chose to wash your hair, wash your hair first as usual with your normal shampoo. After you shampoo, rinse your hair and body thoroughly to remove the shampoo.  Then [employer] and genitals (private parts) with your normal soap and rinse thoroughly to remove soap.  After that Use CHG Soap as you would any other liquid soap. You can apply CHG directly to the skin and wash gently with a scrungie or a clean washcloth.   Apply the CHG  Soap to your body ONLY FROM THE NECK DOWN.  Do not use on open wounds or open sores. Avoid contact with your eyes, ears, mouth and genitals (private parts). Wash Face and genitals (private parts)  with your normal soap.   Wash thoroughly, paying special attention to the area where your surgery will be performed.  Thoroughly rinse your body with warm water from the neck down.  DO NOT shower/wash with your normal soap after using and rinsing off the CHG Soap.  Pat yourself dry with a CLEAN TOWEL.  Wear CLEAN PAJAMAS to bed the night before surgery  Place CLEAN SHEETS on your bed the night before your surgery  DO NOT SLEEP WITH PETS.   Day of Surgery:  Take a shower with CHG soap. Wear Clean/Comfortable clothing the morning of surgery Do not apply any deodorants/lotions.   Remember to brush your teeth WITH YOUR REGULAR TOOTHPASTE.    If you received a COVID test during your pre-op visit, it is requested that you wear a mask when out in public, stay away from anyone that may not be feeling well, and notify your surgeon if you develop symptoms. If you have been in contact with anyone that has tested positive in the last 10 days, please notify your surgeon.    Please read over the following fact sheets that you were given.

## 2023-06-20 ENCOUNTER — Encounter (HOSPITAL_COMMUNITY): Payer: Self-pay

## 2023-06-20 ENCOUNTER — Other Ambulatory Visit: Payer: Self-pay

## 2023-06-20 ENCOUNTER — Ambulatory Visit (HOSPITAL_BASED_OUTPATIENT_CLINIC_OR_DEPARTMENT_OTHER)
Admission: RE | Admit: 2023-06-20 | Discharge: 2023-06-20 | Disposition: A | Discharge status: Home / Self Care | Payer: Medicare HMO | Source: Ambulatory Visit | Attending: Thoracic Surgery (Cardiothoracic Vascular Surgery) | Admitting: Thoracic Surgery (Cardiothoracic Vascular Surgery)

## 2023-06-20 ENCOUNTER — Encounter (HOSPITAL_COMMUNITY)
Admission: RE | Admit: 2023-06-20 | Discharge: 2023-06-20 | Disposition: A | Discharge status: Home / Self Care | Payer: Medicare HMO | Source: Ambulatory Visit | Attending: Thoracic Surgery (Cardiothoracic Vascular Surgery) | Admitting: Thoracic Surgery (Cardiothoracic Vascular Surgery)

## 2023-06-20 ENCOUNTER — Ambulatory Visit (HOSPITAL_COMMUNITY)
Admission: RE | Admit: 2023-06-20 | Discharge: 2023-06-20 | Disposition: A | Discharge status: Home / Self Care | Payer: Medicare HMO | Source: Ambulatory Visit | Attending: Thoracic Surgery (Cardiothoracic Vascular Surgery) | Admitting: Thoracic Surgery (Cardiothoracic Vascular Surgery)

## 2023-06-20 VITALS — BP 191/89 | HR 65 | Temp 98.3°F | Resp 17 | Ht 75.0 in | Wt 214.9 lb

## 2023-06-20 DIAGNOSIS — J984 Other disorders of lung: Secondary | ICD-10-CM | POA: Insufficient documentation

## 2023-06-20 DIAGNOSIS — I132 Hypertensive heart and chronic kidney disease with heart failure and with stage 5 chronic kidney disease, or end stage renal disease: Secondary | ICD-10-CM | POA: Insufficient documentation

## 2023-06-20 DIAGNOSIS — Z1152 Encounter for screening for COVID-19: Secondary | ICD-10-CM | POA: Insufficient documentation

## 2023-06-20 DIAGNOSIS — I340 Nonrheumatic mitral (valve) insufficiency: Secondary | ICD-10-CM | POA: Insufficient documentation

## 2023-06-20 DIAGNOSIS — I4891 Unspecified atrial fibrillation: Secondary | ICD-10-CM | POA: Insufficient documentation

## 2023-06-20 DIAGNOSIS — Z01818 Encounter for other preprocedural examination: Secondary | ICD-10-CM | POA: Insufficient documentation

## 2023-06-20 DIAGNOSIS — I509 Heart failure, unspecified: Secondary | ICD-10-CM | POA: Insufficient documentation

## 2023-06-20 DIAGNOSIS — Z87891 Personal history of nicotine dependence: Secondary | ICD-10-CM | POA: Insufficient documentation

## 2023-06-20 DIAGNOSIS — Z992 Dependence on renal dialysis: Secondary | ICD-10-CM | POA: Insufficient documentation

## 2023-06-20 DIAGNOSIS — N289 Disorder of kidney and ureter, unspecified: Secondary | ICD-10-CM

## 2023-06-20 DIAGNOSIS — I2510 Atherosclerotic heart disease of native coronary artery without angina pectoris: Secondary | ICD-10-CM | POA: Insufficient documentation

## 2023-06-20 DIAGNOSIS — N186 End stage renal disease: Secondary | ICD-10-CM | POA: Insufficient documentation

## 2023-06-20 DIAGNOSIS — I071 Rheumatic tricuspid insufficiency: Secondary | ICD-10-CM | POA: Insufficient documentation

## 2023-06-20 DIAGNOSIS — I444 Left anterior fascicular block: Secondary | ICD-10-CM | POA: Insufficient documentation

## 2023-06-20 DIAGNOSIS — R188 Other ascites: Secondary | ICD-10-CM | POA: Insufficient documentation

## 2023-06-20 HISTORY — DX: Pneumonia, unspecified organism: J18.9

## 2023-06-20 HISTORY — DX: Disease of blood and blood-forming organs, unspecified: D75.9

## 2023-06-20 HISTORY — DX: Cardiac arrhythmia, unspecified: I49.9

## 2023-06-20 HISTORY — DX: Cardiac murmur, unspecified: R01.1

## 2023-06-20 LAB — CBC
HCT: 36 % — ABNORMAL LOW (ref 39.0–52.0)
Hemoglobin: 11.5 g/dL — ABNORMAL LOW (ref 13.0–17.0)
MCH: 32.6 pg (ref 26.0–34.0)
MCHC: 31.9 g/dL (ref 30.0–36.0)
MCV: 102 fL — ABNORMAL HIGH (ref 80.0–100.0)
Platelets: 100 10*3/uL — ABNORMAL LOW (ref 150–400)
RBC: 3.53 MIL/uL — ABNORMAL LOW (ref 4.22–5.81)
RDW: 14.7 % (ref 11.5–15.5)
WBC: 2.2 10*3/uL — ABNORMAL LOW (ref 4.0–10.5)
nRBC: 0 % (ref 0.0–0.2)

## 2023-06-20 LAB — BLOOD GAS, ARTERIAL
Acid-Base Excess: 1.9 mmol/L (ref 0.0–2.0)
Bicarbonate: 26.6 mmol/L (ref 20.0–28.0)
Drawn by: 58793
O2 Saturation: 98.7 %
Patient temperature: 37
pCO2 arterial: 41 mmHg (ref 32–48)
pH, Arterial: 7.42 (ref 7.35–7.45)
pO2, Arterial: 92 mmHg (ref 83–108)

## 2023-06-20 LAB — COMPREHENSIVE METABOLIC PANEL
ALT: 38 U/L (ref 0–44)
AST: 48 U/L — ABNORMAL HIGH (ref 15–41)
Albumin: 3.7 g/dL (ref 3.5–5.0)
Alkaline Phosphatase: 94 U/L (ref 38–126)
Anion gap: 15 (ref 5–15)
BUN: 52 mg/dL — ABNORMAL HIGH (ref 6–20)
CO2: 23 mmol/L (ref 22–32)
Calcium: 9 mg/dL (ref 8.9–10.3)
Chloride: 97 mmol/L — ABNORMAL LOW (ref 98–111)
Creatinine, Ser: 10.48 mg/dL — ABNORMAL HIGH (ref 0.61–1.24)
GFR, Estimated: 5 mL/min — ABNORMAL LOW (ref >60)
Glucose, Bld: 71 mg/dL (ref 70–99)
Potassium: 5.6 mmol/L — ABNORMAL HIGH (ref 3.5–5.1)
Sodium: 135 mmol/L (ref 135–145)
Total Bilirubin: 0.7 mg/dL (ref 0.3–1.2)
Total Protein: 9 g/dL — ABNORMAL HIGH (ref 6.5–8.1)

## 2023-06-20 LAB — PROTIME-INR
INR: 1.5 — ABNORMAL HIGH (ref 0.8–1.2)
Prothrombin Time: 18.6 seconds — ABNORMAL HIGH (ref 11.4–15.2)

## 2023-06-20 LAB — APTT: aPTT: 39 seconds — ABNORMAL HIGH (ref 24–36)

## 2023-06-20 LAB — SURGICAL PCR SCREEN
MRSA, PCR: NEGATIVE
Staphylococcus aureus: NEGATIVE

## 2023-06-20 LAB — TYPE AND SCREEN
ABO/RH(D): A POS
Antibody Screen: NEGATIVE

## 2023-06-20 NOTE — Progress Notes (Signed)
UA not completed at pre-op appointment. Pt has ESRD and is on HD. He does not make urine. Mark K. With Dr. Karolee Ohs office made aware

## 2023-06-20 NOTE — Progress Notes (Addendum)
PCP - Dr. Neita Garnet Cardiologist - Dr. Lennie Odor - Last office visit 04/25/2023  Nephrologist - Dr. Aura Camps  PPM/ICD - Denies Device Orders - n/a Rep Notified - n/a  Chest x-ray - 06/20/2023 EKG - 06/20/2023 Stress Test - 07/02/2013  ECHO - 04/18/2023 Cardiac Cath - 11/24/2022  Sleep Study - +OSA. Pt wears CPAP and oxygen 3L  nightly CPAP - n/a  No DM  Last dose of GLP1 agonist- n/a GLP1 instructions: n/a  Blood Thinner Instructions: n/a Aspirin Instructions: n/a  NPO after midnight  COVID TEST- Yes.    Anesthesia review: Yes. Cardiac Hx (CHF, HTN, A.Fib with recent DCCV May 2024). ESRD on HD. Pt has had transplant in 2013. He is planning on having HD the day before surgery. Abnormal PT result.   Patient denies shortness of breath, fever, cough and chest pain at PAT appointment. Pt denies any respiratory illness/infection in the last two months.    All instructions explained to the patient, with a verbal understanding of the material. Patient agrees to go over the instructions while at home for a better understanding. Patient also instructed to self quarantine after being tested for COVID-19. The opportunity to ask questions was provided.

## 2023-06-20 NOTE — Pre-Procedure Instructions (Signed)
Surgical Instructions   Your procedure is scheduled on June 22, 2023. Report to [facility] Main Entrance "A" at 5:30 A.M., then check in with the Admitting office. Any questions or running late day of surgery: call [phone]  Questions prior to your surgery date: call [phone], Monday-Friday, 8am-4pm. If you experience any cold or flu symptoms such as cough, fever, chills, shortness of breath, etc. between now and your scheduled surgery, please notify us at the above number.     Remember:  Do not eat or drink after midnight the night before your surgery     Take these medicines the morning of surgery with A SIP OF WATER: acyclovir (ZOVIRAX)  amLODipine (NORVASC)  carvedilol (COREG)    May take these medicines IF NEEDED: acetaminophen (TYLENOL)  HYDROcodone-acetaminophen (NORCO)    One week prior to surgery, STOP taking any Aspirin (unless otherwise instructed by your surgeon) Aleve, Naproxen, Ibuprofen, Motrin, Advil, Goody's, BC's, all herbal medications, fish oil, and non-prescription vitamins. This includes your medication: bismuth subsalicylate (PEPTO BISMOL)                      Do NOT Smoke (Tobacco/Vaping) for 24 hours prior to your procedure.  If you use a CPAP at night, you may bring your mask/headgear for your overnight stay.   You will be asked to remove any contacts, glasses, piercing's, hearing aid's, dentures/partials prior to surgery. Please bring cases for these items if needed.    Patients discharged the day of surgery will not be allowed to drive home, and someone needs to stay with them for 24 hours.  SURGICAL WAITING ROOM VISITATION Patients may have no more than 2 support people in the waiting area - these visitors may rotate.   Pre-op nurse will coordinate an appropriate time for 1 ADULT support person, who may not rotate, to accompany patient in pre-op.  Children under the age of 34 must have an adult with them who is not the patient and must  remain in the main waiting area with an adult.  If the patient needs to stay at the hospital during part of their recovery, the visitor guidelines for inpatient rooms apply.  Please refer to the [facility] website for the visitor guidelines for any additional information.   If you received a COVID test during your pre-op visit  it is requested that you wear a mask when out in public, stay away from anyone that may not be feeling well and notify your surgeon if you develop symptoms. If you have been in contact with anyone that has tested positive in the last 10 days please notify you surgeon.      Pre-operative CHG Bathing Instructions   You can play a key role in reducing the risk of infection after surgery. Your skin needs to be as free of germs as possible. You can reduce the number of germs on your skin by washing with CHG (chlorhexidine gluconate) soap before surgery. CHG is an antiseptic soap that kills germs and continues to kill germs even after washing.   DO NOT use if you have an allergy to chlorhexidine/CHG or antibacterial soaps. If your skin becomes reddened or irritated, stop using the CHG and notify one of our RNs at [phone].              TAKE A SHOWER THE NIGHT BEFORE SURGERY AND THE DAY OF SURGERY    Please keep in mind the following:  DO NOT shave, including  legs and underarms, 48 hours prior to surgery.   You may shave your face before/day of surgery.  Place clean sheets on your bed the night before surgery Use a clean washcloth (not used since being washed) for each shower. DO NOT sleep with pet's night before surgery.  CHG Shower Instructions:  If you choose to wash your hair and private area, wash first with your normal shampoo/soap.  After you use shampoo/soap, rinse your hair and body thoroughly to remove shampoo/soap residue.  Turn the water OFF and apply half the bottle of CHG soap to a CLEAN washcloth.  Apply CHG soap ONLY FROM YOUR NECK DOWN TO YOUR  TOES (washing for 3-5 minutes)  DO NOT use CHG soap on face, private areas, open wounds, or sores.  Pay special attention to the area where your surgery is being performed.  If you are having back surgery, having someone wash your back for you may be helpful. Wait 2 minutes after CHG soap is applied, then you may rinse off the CHG soap.  Pat dry with a clean towel  Put on clean pajamas    Additional instructions for the day of surgery: DO NOT APPLY any lotions, deodorants, cologne, or perfumes.   Do not wear jewelry or makeup Do not wear nail polish, gel polish, artificial nails, or any other type of covering on natural nails (fingers and toes) Do not bring valuables to the hospital. [facility] is not responsible for valuables/personal belongings. Put on clean/comfortable clothes.  Please brush your teeth.  Ask your nurse before applying any prescription medications to the skin.

## 2023-06-21 MED ORDER — VANCOMYCIN HCL 1000 MG IV SOLR
INTRAVENOUS | Status: DC
  Filled 2023-06-21: qty 20

## 2023-06-21 MED ORDER — TRANEXAMIC ACID (OHS) BOLUS VIA INFUSION
15.0000 mg/kg | INTRAVENOUS | Status: AC
  Administered 2023-06-22: 1462.5 mg via INTRAVENOUS
  Filled 2023-06-21: qty 1463

## 2023-06-21 MED ORDER — MANNITOL 20 % IV SOLN
INTRAVENOUS | Status: DC
  Filled 2023-06-21: qty 13

## 2023-06-21 MED ORDER — VANCOMYCIN HCL 1500 MG/300ML IV SOLN
1500.0000 mg | INTRAVENOUS | Status: AC
  Administered 2023-06-22: 1500 mg via INTRAVENOUS
  Filled 2023-06-21: qty 300

## 2023-06-21 MED ORDER — TRANEXAMIC ACID (OHS) PUMP PRIME SOLUTION
2.0000 mg/kg | INTRAVENOUS | Status: DC
  Filled 2023-06-21: qty 1.95

## 2023-06-21 MED ORDER — DEXMEDETOMIDINE HCL IN NACL 400 MCG/100ML IV SOLN
0.1000 ug/kg/h | INTRAVENOUS | Status: AC
  Administered 2023-06-22: .3 ug/kg/h via INTRAVENOUS
  Filled 2023-06-21: qty 100

## 2023-06-21 MED ORDER — POTASSIUM CHLORIDE 2 MEQ/ML IV SOLN
80.0000 meq | INTRAVENOUS | Status: DC
  Filled 2023-06-21: qty 40

## 2023-06-21 MED ORDER — NITROGLYCERIN IN D5W 200-5 MCG/ML-% IV SOLN
2.0000 ug/min | INTRAVENOUS | Status: DC
  Filled 2023-06-21: qty 250

## 2023-06-21 MED ORDER — HEPARIN 30,000 UNITS/1000 ML (OHS) CELLSAVER SOLUTION
Status: DC
  Filled 2023-06-21: qty 1000

## 2023-06-21 MED ORDER — CEFAZOLIN SODIUM-DEXTROSE 2-4 GM/100ML-% IV SOLN
2.0000 g | INTRAVENOUS | Status: AC
  Administered 2023-06-22: 2 g via INTRAVENOUS
  Filled 2023-06-21: qty 100

## 2023-06-21 MED ORDER — NOREPINEPHRINE 4 MG/250ML-% IV SOLN
0.0000 ug/min | INTRAVENOUS | Status: AC
  Administered 2023-06-22: 3 ug/min via INTRAVENOUS
  Filled 2023-06-21: qty 250

## 2023-06-21 MED ORDER — EPINEPHRINE HCL 5 MG/250ML IV SOLN IN NS
0.0000 ug/min | INTRAVENOUS | Status: AC
  Administered 2023-06-22: 3 ug/min via INTRAVENOUS
  Filled 2023-06-21: qty 250

## 2023-06-21 MED ORDER — TRANEXAMIC ACID 1000 MG/10ML IV SOLN
1.5000 mg/kg/h | INTRAVENOUS | Status: AC
  Administered 2023-06-22: 1.5 mg/kg/h via INTRAVENOUS
  Filled 2023-06-21: qty 25

## 2023-06-21 MED ORDER — PLASMA-LYTE A IV SOLN
INTRAVENOUS | Status: DC
  Filled 2023-06-21: qty 2.5

## 2023-06-21 MED ORDER — PHENYLEPHRINE HCL-NACL 20-0.9 MG/250ML-% IV SOLN
30.0000 ug/min | INTRAVENOUS | Status: DC
  Filled 2023-06-21: qty 250

## 2023-06-21 MED ORDER — INSULIN REGULAR(HUMAN) IN NACL 100-0.9 UT/100ML-% IV SOLN
INTRAVENOUS | Status: AC
  Administered 2023-06-22: 5 [IU]/h via INTRAVENOUS
  Filled 2023-06-21: qty 100

## 2023-06-21 MED ORDER — MILRINONE LACTATE IN DEXTROSE 20-5 MG/100ML-% IV SOLN
0.3000 ug/kg/min | INTRAVENOUS | Status: DC
  Filled 2023-06-21: qty 100

## 2023-06-21 NOTE — H&P (Signed)
[address]       [name] [address]             [phone]                                   [name] [facility] Medical Record #[id number] Date of Birth: 21-May-1971   Mark Sours, MD   Chief Complaint: increasing fatigue/SOB     History of Present Illness:     Pt is a very pleasant 52 yo wm who has been followed by Dr Scharlene Gloss for severe MR and TR. Pt has had increasing fatigue and when pushing himself having DOE. He is chronic hemodialysis for over 20 years and has not been having issues tolerating dialysis. He however with echo and TEE last year to have severe MR from what appears to be A1-2 flail. 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 He has mild AS with a mean gradient at cath and echo of only . He does have moderate PHTN with SPA pressures of . He was felt that the best course for his multivalvular issues was surgery and pt has been asked to discuss this with cardiac surgery. He has varying Hgb levels reportedly without GI concerns but recently had some hematuria that he will be discussing with his urologist from [facility]             Past Medical History:  Diagnosis Date   ADHD (attention deficit hyperactivity disorder)     Anemia     Anemia, chronic disease 07/13/2011   Anxiety     Arthritis     Bipolar 2 disorder (HCC) 08/18/2011    Diagnosed in 2011.    Bipolar disorder (HCC)     Blood transfusion     Blood transfusion without reported diagnosis     Chronic diastolic CHF (congestive heart failure) (HCC)     Complication of anesthesia      Woke up intubated gets combative  afraid to be alone    Congestive heart failure (CHF) (HCC) 07/13/2011    Onset 2006.  Followed by Dr. Eden Emms.  S/p cardiac catheterization in [facility] Dr. Anne Fu..  S/p cardiac catheterization in 2014 by [facility].  Echo 2014.    Depression     Erectile dysfunction     ESRD on hemodialysis (HCC)  07/13/2011    Glomerulonephritis at age 28, started HD in 81.  Deceased donor renal transplant 1999 at [facility], then kidney failed and went back on HD in 2006.  L forearm AVF . s/p repeat renal transplant 10/2014 [facility].   GI bleed 11/01/2011    Rectal bleeding with emesis/diarrhea    Hemodialysis status (HCC)     Hydrocele, right 11/25/2014   Hypertension     Hypertensive emergency 01/06/2013   Hypoglycemia 01/11/2013   Influenza-like illness 12/18/2013   Mild ascending aorta dilatation (HCC)     NICM (nonischemic cardiomyopathy) (HCC)     Pericardial effusion      a. Mod by echo in 2014, similar to prior.   Peripheral polyneuropathy 08/04/2016   Pulmonary edema 01/06/2013   Pulmonary hypertension associated with end stage renal disease on dialysis [facility]) 07/13/2011   Renal transplant, status post 11/25/2014    Pt with Glomerulonephritis at age 35. Deceased donor renal transplant 1999 right and left renal transplant 10/2014.  Followed by [facility] transplant team; nephrologist is Dr. Lonna Cobb.    Respiratory failure with hypoxia (HCC) 01/06/2013   Shortness of breath 12/12/2012   Swollen testicle 07/13/2011   URI (upper respiratory infection) 01/25/2012               Past Surgical History:  Procedure Laterality Date   ANGIOPLASTY       BUBBLE STUDY   09/05/2022    Procedure: BUBBLE STUDY;  Surgeon: Sande Rives, MD;  Location: [facility] ENDOSCOPY;  Service: Cardiovascular;;   CHOLECYSTECTOMY       DIALYSIS FISTULA CREATION       EVALUATION UNDER ANESTHESIA WITH HEMORRHOIDECTOMY N/A 03/15/2020    Procedure: CONTROL OF ANAL BLEEDING;  Surgeon: Andria Meuse, MD;  Location: [facility] OR;  Service: General;  Laterality: N/A;   KIDNEY TRANSPLANT   10/17/14   KNEE SURGERY       LEFT HEART CATHETERIZATION WITH CORONARY ANGIOGRAM N/A 10/01/2013    Procedure: LEFT HEART CATHETERIZATION WITH CORONARY ANGIOGRAM;  Surgeon: Wendall Stade, MD;  Location: [facility] CATH LAB;  Service: Cardiovascular;  Laterality: N/A;    MOUTH SURGERY       NEPHRECTOMY TRANSPLANTED ORGAN       RIGHT/LEFT HEART CATH AND CORONARY ANGIOGRAPHY N/A 11/24/2022    Procedure: RIGHT/LEFT HEART CATH AND CORONARY ANGIOGRAPHY;  Surgeon: Kathleene Hazel, MD;  Location: [facility] INVASIVE CV LAB;  Service: Cardiovascular;  Laterality: N/A;   TEE WITHOUT CARDIOVERSION N/A 09/05/2022    Procedure: TRANSESOPHAGEAL ECHOCARDIOGRAM (TEE);  Surgeon: Sande Rives, MD;  Location: [facility] ENDOSCOPY;  Service: Cardiovascular;  Laterality: N/A;          Tobacco Use History  Social History        Tobacco Use  Smoking Status Former   Types: Cigars  Smokeless Tobacco Never      Social History        Substance and Sexual Activity  Alcohol Use No   Alcohol/week: 1.0 standard drink of alcohol   Types: 1 Standard drinks or equivalent per week    Comment: Stopped drinking after Kidney transplant in December 2015      Social History         Socioeconomic History   Marital status: Single      Spouse name: Not on file   Number of children: 0   Years of education: 20+   Highest education level: Not on file  Occupational History   Occupation: Chef- fulltime  Tobacco Use   Smoking status: Former      Types: Cigars   Smokeless tobacco: Never  Building services engineer Use: Never used  Substance and Sexual Activity   Alcohol use: No      Alcohol/week: 1.0 standard drink of alcohol      Types: 1 Standard drinks or equivalent per week      Comment: Stopped drinking after Kidney transplant in December 2015   Drug use: No   Sexual activity: Yes  Other Topics Concern   Not on file  Social History Narrative    Marital status: single; dating       Children; none       Lives: alone       Employment: Investment banker, operational at hotel; 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       Tobacco: none       Alcohol: none       Drugs: none       Exercise:  cycling; 3 days per week.       Sexual activity: females only; no male encounters;  Chlamydia age 38.  Total partner = 55.  Condoms.         Caffeine use: Soda sometimes    Social Determinants of Manufacturing engineer Strain: Not on file  Food Insecurity: Not on file  Transportation Needs: Not on file  Physical Activity: Not on file  Stress: Not on file  Social Connections: Not on file  Intimate Partner Violence: Not on file      Allergies       Allergies  Allergen Reactions   Delacort [Hydrocortisone Base] Nausea Only   Benicar [Olmesartan]        Unknown   Diltiazem Other (See Comments)      Tachycardia   Food Itching      bananas   Hydralazine Other (See Comments)      Tachycardia   Nitrates, Organic Other (See Comments)      Palpitations, headache   Psyllium Other (See Comments)      Unknown              Current Outpatient Medications  Medication Sig Dispense Refill   acetaminophen (TYLENOL) 500 MG tablet Take 1,000 mg by mouth daily as needed for moderate pain.       acyclovir (ZOVIRAX) 200 MG capsule Take 200 mg by mouth 2 (two) times daily.       amLODipine (NORVASC) 2.5 MG tablet Take 1 tablet (2.5 mg total) by mouth daily. (Patient taking differently: Take 2.5 mg by mouth See admin instructions. Take 2.5 mg on Tuesday and Thursday, Saturday and Sunday once a day in the morning) 90 tablet 3   amphetamine-dextroamphetamine (ADDERALL XR) 20 MG 24 hr capsule Take 20 mg by mouth every morning.       amphetamine-dextroamphetamine (ADDERALL) 10 MG tablet Take 10 mg by mouth every evening.       bismuth subsalicylate (PEPTO BISMOL) 262 MG chewable tablet Chew 524 mg by mouth as needed for indigestion.       calcium carbonate (TUMS EX) 750 MG chewable tablet Chew 1,500 mg by mouth See admin instructions. Take 1500 mg twice daily with snacks       carvedilol (COREG) 12.5 MG tablet Take 12.5 mg by mouth See admin instructions. Take 12.5 mg on Monday,Wednesday and Friday once a day in the morning       carvedilol (COREG) 25 MG tablet Take 1 tablet  (25 mg total) by mouth 2 times daily with meals. (Patient taking differently: Take 25 mg by mouth See admin instructions. Take 25 mg twice a day on Tuesday,Thursday, Saturday and Sunday) 180 tablet 2   clobetasol ointment (TEMOVATE) 0.05 % Apply 1 Application topically 2 (two) times daily as needed (rash).       clonazePAM (KLONOPIN) 0.5 MG tablet Take 1 mg by mouth See admin instructions. Take 1 mg Monday, Wednesday and Friday in the evening       HYDROcodone-acetaminophen (NORCO) 7.5-325 MG tablet Take 1 tablet by mouth 3 (three) times daily as needed for severe pain or moderate pain.       lidocaine-prilocaine (EMLA) cream Apply 1 Application topically every Monday, Wednesday, and Friday. One hour prior to Dialysis       sevelamer carbonate (RENVELA) 0.8 g PACK packet Take 0.8-1.6 g by mouth 3 (three) times daily with meals.          No current facility-administered  medications for this visit.               Family History  Problem Relation Age of Onset   Arthritis Mother     Hypertension Mother     Diabetes Mother     Depression Mother     Arthritis Father     Hypertension Father     Diabetes Father     Depression Father                  Physical Exam: Neuro: alert and no focal deficits Teeth in good repair Lungs: clear Cardiac: RR with 2/6 sem to axilla Ext: no edema         Diagnostic Studies & Laboratory data: I have personally reviewed the following studies and agree with the findings   TEE (08/2022) IMPRESSIONS     1. Severe MV regurgitation due to a flail A1 segment. Posteriorly  directed jet. 2D ERO 0.26 cm2, R vol 58 cc, 3D VCA 0.50 cm2. Systolic flow  reversal in the LUPV. MG 2 mmHG @ 67 bpm. MVA 4.80 cm2. PMVL length 21 mm.  The mitral valve is degenerative.  Severe mitral valve regurgitation. No evidence of mitral stenosis. The  mean mitral valve gradient is 2.0 mmHg with average heart rate of 67 bpm.   2. Severe, torrential tricuspid regurgitation  due to incomplete leaflet  coaptation. 2D ERO 0.55 cm2, R vol 47 cc. The tricuspid valve is abnormal.  Tricuspid valve regurgitation is severe.   3. The aortic valve is tricuspid. There is moderate calcification of the  aortic valve. There is moderate thickening of the aortic valve. Aortic  valve regurgitation is mild. Mild aortic valve stenosis. Aortic valve  area, by VTI measures 2.21 cm. Aortic  valve mean gradient measures 14.6 mmHg. Aortic valve Vmax measures 2.51  m/s.   4. Left ventricular ejection fraction, by estimation, is 45 to 50%. The  left ventricle has mildly decreased function.   5. Right ventricular systolic function is moderately reduced. The right  ventricular size is severely enlarged. There is mildly elevated pulmonary  artery systolic pressure. The estimated right ventricular systolic  pressure is 40.6 mmHg.   6. Left atrial size was severely dilated. No left atrial/left atrial  appendage thrombus was detected. The LAA emptying velocity was 40 cm/s.   7. Right atrial size was severely dilated.   8. A small pericardial effusion is present. The pericardial effusion is  circumferential.   9. Aortic dilatation noted. Aneurysm of the aortic root, measuring 44 mm.  10. The inferior vena cava is dilated in size with <50% respiratory  variability, suggesting right atrial pressure of 15 mmHg.  11. Agitated saline contrast bubble study was negative, with no evidence  of any interatrial shunt.   FINDINGS   Left Ventricle: Left ventricular ejection fraction, by estimation, is 45  to 50%. The left ventricle has mildly decreased function. The left  ventricular internal cavity size was normal in size.   Right Ventricle: The right ventricular size is severely enlarged. No  increase in right ventricular wall thickness. Right ventricular systolic  function is moderately reduced. There is mildly elevated pulmonary artery  systolic pressure. The tricuspid  regurgitant velocity  is 2.53 m/s, and with an assumed right atrial  pressure of 15 mmHg, the estimated right ventricular systolic pressure is  40.6 mmHg.   Left Atrium: Left atrial size was severely dilated. No left atrial/left  atrial appendage thrombus was detected. The LAA  emptying velocity was 40  cm/s.   Right Atrium: Right atrial size was severely dilated.   Pericardium: A small pericardial effusion is present. The pericardial  effusion is circumferential.   Mitral Valve: Severe MV regurgitation due to a flail A1 segment.  Posteriorly directed jet. 2D ERO 0.26 cm2, R vol 58 cc, 3D VCA 0.50 cm2.  Systolic flow reversal in the LUPV. MG 2 mmHG @ 67 bpm. MVA 4.80 cm2. PMVL  length 21 mm. The mitral valve is  degenerative in appearance. There is mild calcification of the anterior  and posterior mitral valve leaflet(s). Severe mitral valve regurgitation.  No evidence of mitral valve stenosis. MV peak gradient, 5.9 mmHg. The mean  mitral valve gradient is 2.0 mmHg   with average heart rate of 67 bpm.   Tricuspid Valve: Severe, torrential tricuspid regurgitation due to  incomplete leaflet coaptation. 2D ERO 0.55 cm2, R vol 47 cc. The tricuspid  valve is abnormal. Tricuspid valve regurgitation is severe. No evidence of  tricuspid stenosis. The flow in the  hepatic veins is reversed during ventricular systole.   Aortic Valve: The aortic valve is tricuspid. There is moderate  calcification of the aortic valve. There is moderate thickening of the  aortic valve. Aortic valve regurgitation is mild. Mild aortic stenosis is  present. Aortic valve mean gradient measures  14.6 mmHg. Aortic valve peak gradient measures 25.1 mmHg. Aortic valve  area, by VTI measures 2.21 cm.   Pulmonic Valve: The pulmonic valve was grossly normal. Pulmonic valve  regurgitation is trivial. No evidence of pulmonic stenosis.   Aorta: Aortic dilatation noted. 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    Venous: A pattern of systolic flow reversal, suggestive of severe mitral  regurgitation is recorded from the left upper pulmonary vein. The inferior  vena cava is dilated in size with less than 50% respiratory variability,  suggesting right atrial pressure  of 15 mmHg.   IAS/Shunts: There is left bowing of the interatrial septum, suggestive of  elevated right atrial pressure. No atrial level shunt detected by color  flow Doppler. Agitated saline contrast was given intravenously to evaluate  for intracardiac shunting.  Agitated saline contrast bubble study was negative, with no evidence of  any interatrial shunt.     LEFT VENTRICLE  PLAX 2D  LVOT diam:     2.60 cm  LV SV:         124  LV SV Index:   55  LVOT Area:     5.31 cm     AORTIC VALVE  AV Area (Vmax):    2.05 cm  AV Area (Vmean):   2.03 cm  AV Area (VTI):     2.21 cm  AV Vmax:           250.69 cm/s  AV Vmean:          182.642 cm/s  AV VTI:            0.561 m  AV Peak Grad:      25.1 mmHg  AV Mean Grad:      14.6 mmHg  LVOT Vmax:         96.76 cm/s  LVOT Vmean:        69.952 cm/s  LVOT VTI:          0.234 m  LVOT/AV VTI ratio: 0.42    AORTA  Ao Root diam: 4.40 cm  Ao Asc diam:  3.30 cm  MITRAL VALVE                  TRICUSPID VALVE  MV Peak grad: 5.9 mmHg        TR Peak grad:   25.6 mmHg  MV Mean grad: 2.0 mmHg        TR Mean grad:   15.0 mmHg  MV Vmax:      1.21 m/s        TR Vmax:        253.00 cm/s  MV Vmean:     71.7 cm/s       TR Vmean:       185.0 cm/s  MR Peak grad:    145.4 mmHg  MR Mean grad:    96.0 mmHg    SHUNTS  MR Vmax:         603.00 cm/s  Systemic VTI:  0.23 m  MR Vmean:        456.0 cm/s   Systemic Diam: 2.60 cm  MR PISA:         4.02 cm  MR PISA Eff ROA: 26 mm  MR PISA Radius:  0.80 cm    CATH (11/2022) Conclusion       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   Mild calcified plaque in the mid segment of the large dominant RCA No obstructive disease in the Circumflex or  LAD Elevated right heart pressures (RA 25, RV 48/8/25, PA 58/30 mean 40, PCWP 28).  AO 137/79, LV 146/15/21, CO 9.8 L/min, CI 4.45   Severe MR and TR by echo.  Moderate aortic stenosis (peak to peak gradient 14.8, mean gradient 14.6)    Recent Radiology Findings:        Recent Lab Findings:         Assessment / Plan:     52 yo male with chronic renal failure on dialysis who has severe MR and TR and mild AS with NYHA class 1-2 symptoms with mildly reduced LV function and moderate PHTN. We discussed his surgical issues that I feel would include Mitral valve repair with a 90% success rate with the flail etiology and TV repair with annuloplasty. I dont feel his AS warrants being addressed since that would require Mechanical valve replacement and at this point his MV/TV would not require life long anticoagulation if able to be repaired. He understands his increased risk for surgery with his renal failure and I have quoted him a 2% surgical mortality. He understands the morbidity of CVA, arrhythmias, infection and reduced LV function with surgery also and he understands the recovery needed with surgery.  He is in agreement with this and however would like to postpone surgery till after summer. We discussed that with his current state this is possible as long as he monitors his symptoms closely and will be seeing Dr Scharlene Gloss next month to get his opinion on surgical timing. He could have a repeat echo soon to confirm no change in LV function/PHTN to help guide timing of surgery  Will await his decision on when to proceed with surgery and coordinate his dialysis to be the day before.

## 2023-06-22 ENCOUNTER — Inpatient Hospital Stay (HOSPITAL_COMMUNITY)
Admission: RE | Admit: 2023-06-22 | Discharge: 2023-08-08 | DRG: 216 | Disposition: E | Payer: Medicare HMO | Attending: Thoracic Surgery (Cardiothoracic Vascular Surgery) | Admitting: Thoracic Surgery (Cardiothoracic Vascular Surgery)

## 2023-06-22 ENCOUNTER — Other Ambulatory Visit: Payer: Self-pay

## 2023-06-22 ENCOUNTER — Inpatient Hospital Stay (HOSPITAL_COMMUNITY): Payer: Medicare HMO

## 2023-06-22 ENCOUNTER — Inpatient Hospital Stay (HOSPITAL_COMMUNITY): Payer: Medicare HMO | Admitting: Anesthesiology

## 2023-06-22 ENCOUNTER — Inpatient Hospital Stay (HOSPITAL_BASED_OUTPATIENT_CLINIC_OR_DEPARTMENT_OTHER): Payer: Medicare HMO

## 2023-06-22 ENCOUNTER — Encounter (HOSPITAL_COMMUNITY): Payer: Self-pay | Admitting: Thoracic Surgery (Cardiothoracic Vascular Surgery)

## 2023-06-22 ENCOUNTER — Inpatient Hospital Stay (HOSPITAL_COMMUNITY)
Admission: RE | Disposition: E | Payer: Self-pay | Source: Home / Self Care | Attending: Thoracic Surgery (Cardiothoracic Vascular Surgery)

## 2023-06-22 DIAGNOSIS — I083 Combined rheumatic disorders of mitral, aortic and tricuspid valves: Secondary | ICD-10-CM | POA: Diagnosis present

## 2023-06-22 DIAGNOSIS — I361 Nonrheumatic tricuspid (valve) insufficiency: Secondary | ICD-10-CM

## 2023-06-22 DIAGNOSIS — Z5320 Procedure and treatment not carried out because of patient's decision for unspecified reasons: Secondary | ICD-10-CM | POA: Diagnosis present

## 2023-06-22 DIAGNOSIS — Z8249 Family history of ischemic heart disease and other diseases of the circulatory system: Secondary | ICD-10-CM

## 2023-06-22 DIAGNOSIS — Z952 Presence of prosthetic heart valve: Secondary | ICD-10-CM

## 2023-06-22 DIAGNOSIS — I428 Other cardiomyopathies: Secondary | ICD-10-CM | POA: Diagnosis present

## 2023-06-22 DIAGNOSIS — J95821 Acute postprocedural respiratory failure: Secondary | ICD-10-CM | POA: Diagnosis not present

## 2023-06-22 DIAGNOSIS — E876 Hypokalemia: Secondary | ICD-10-CM | POA: Diagnosis not present

## 2023-06-22 DIAGNOSIS — Z515 Encounter for palliative care: Secondary | ICD-10-CM | POA: Diagnosis not present

## 2023-06-22 DIAGNOSIS — R4182 Altered mental status, unspecified: Secondary | ICD-10-CM | POA: Diagnosis not present

## 2023-06-22 DIAGNOSIS — I3139 Other pericardial effusion (noninflammatory): Secondary | ICD-10-CM | POA: Diagnosis present

## 2023-06-22 DIAGNOSIS — I132 Hypertensive heart and chronic kidney disease with heart failure and with stage 5 chronic kidney disease, or end stage renal disease: Secondary | ICD-10-CM | POA: Diagnosis present

## 2023-06-22 DIAGNOSIS — J9601 Acute respiratory failure with hypoxia: Secondary | ICD-10-CM | POA: Diagnosis not present

## 2023-06-22 DIAGNOSIS — R5381 Other malaise: Secondary | ICD-10-CM | POA: Diagnosis not present

## 2023-06-22 DIAGNOSIS — R579 Shock, unspecified: Secondary | ICD-10-CM | POA: Diagnosis not present

## 2023-06-22 DIAGNOSIS — I4891 Unspecified atrial fibrillation: Secondary | ICD-10-CM

## 2023-06-22 DIAGNOSIS — Z818 Family history of other mental and behavioral disorders: Secondary | ICD-10-CM

## 2023-06-22 DIAGNOSIS — Z7189 Other specified counseling: Secondary | ICD-10-CM | POA: Diagnosis not present

## 2023-06-22 DIAGNOSIS — I10 Essential (primary) hypertension: Secondary | ICD-10-CM | POA: Diagnosis not present

## 2023-06-22 DIAGNOSIS — I340 Nonrheumatic mitral (valve) insufficiency: Secondary | ICD-10-CM | POA: Diagnosis not present

## 2023-06-22 DIAGNOSIS — Z823 Family history of stroke: Secondary | ICD-10-CM

## 2023-06-22 DIAGNOSIS — J9602 Acute respiratory failure with hypercapnia: Secondary | ICD-10-CM | POA: Diagnosis not present

## 2023-06-22 DIAGNOSIS — R6521 Severe sepsis with septic shock: Secondary | ICD-10-CM | POA: Diagnosis not present

## 2023-06-22 DIAGNOSIS — G9340 Encephalopathy, unspecified: Secondary | ICD-10-CM | POA: Diagnosis not present

## 2023-06-22 DIAGNOSIS — Z1152 Encounter for screening for COVID-19: Secondary | ICD-10-CM | POA: Diagnosis not present

## 2023-06-22 DIAGNOSIS — T8612 Kidney transplant failure: Secondary | ICD-10-CM | POA: Diagnosis present

## 2023-06-22 DIAGNOSIS — Z66 Do not resuscitate: Secondary | ICD-10-CM | POA: Diagnosis present

## 2023-06-22 DIAGNOSIS — A419 Sepsis, unspecified organism: Secondary | ICD-10-CM | POA: Diagnosis not present

## 2023-06-22 DIAGNOSIS — R627 Adult failure to thrive: Secondary | ICD-10-CM | POA: Diagnosis present

## 2023-06-22 DIAGNOSIS — I5083 High output heart failure: Secondary | ICD-10-CM | POA: Diagnosis not present

## 2023-06-22 DIAGNOSIS — E43 Unspecified severe protein-calorie malnutrition: Secondary | ICD-10-CM | POA: Diagnosis present

## 2023-06-22 DIAGNOSIS — T8111XA Postprocedural  cardiogenic shock, initial encounter: Secondary | ICD-10-CM | POA: Diagnosis not present

## 2023-06-22 DIAGNOSIS — I2510 Atherosclerotic heart disease of native coronary artery without angina pectoris: Secondary | ICD-10-CM | POA: Diagnosis present

## 2023-06-22 DIAGNOSIS — F3181 Bipolar II disorder: Secondary | ICD-10-CM | POA: Diagnosis present

## 2023-06-22 DIAGNOSIS — E871 Hypo-osmolality and hyponatremia: Secondary | ICD-10-CM | POA: Diagnosis not present

## 2023-06-22 DIAGNOSIS — I442 Atrioventricular block, complete: Secondary | ICD-10-CM | POA: Diagnosis present

## 2023-06-22 DIAGNOSIS — E875 Hyperkalemia: Secondary | ICD-10-CM | POA: Diagnosis not present

## 2023-06-22 DIAGNOSIS — E872 Acidosis, unspecified: Secondary | ICD-10-CM | POA: Diagnosis not present

## 2023-06-22 DIAGNOSIS — N289 Disorder of kidney and ureter, unspecified: Secondary | ICD-10-CM | POA: Diagnosis not present

## 2023-06-22 DIAGNOSIS — I50810 Right heart failure, unspecified: Secondary | ICD-10-CM

## 2023-06-22 DIAGNOSIS — Z9889 Other specified postprocedural states: Principal | ICD-10-CM

## 2023-06-22 DIAGNOSIS — Z8679 Personal history of other diseases of the circulatory system: Secondary | ICD-10-CM | POA: Diagnosis not present

## 2023-06-22 DIAGNOSIS — Y712 Prosthetic and other implants, materials and accessory cardiovascular devices associated with adverse incidents: Secondary | ICD-10-CM | POA: Diagnosis not present

## 2023-06-22 DIAGNOSIS — J9811 Atelectasis: Secondary | ICD-10-CM | POA: Diagnosis not present

## 2023-06-22 DIAGNOSIS — M109 Gout, unspecified: Secondary | ICD-10-CM | POA: Diagnosis present

## 2023-06-22 DIAGNOSIS — N2581 Secondary hyperparathyroidism of renal origin: Secondary | ICD-10-CM | POA: Diagnosis present

## 2023-06-22 DIAGNOSIS — Z789 Other specified health status: Secondary | ICD-10-CM | POA: Diagnosis not present

## 2023-06-22 DIAGNOSIS — I5082 Biventricular heart failure: Secondary | ICD-10-CM | POA: Diagnosis not present

## 2023-06-22 DIAGNOSIS — Z9911 Dependence on respirator [ventilator] status: Secondary | ICD-10-CM

## 2023-06-22 DIAGNOSIS — R570 Cardiogenic shock: Secondary | ICD-10-CM

## 2023-06-22 DIAGNOSIS — N179 Acute kidney failure, unspecified: Secondary | ICD-10-CM

## 2023-06-22 DIAGNOSIS — F05 Delirium due to known physiological condition: Secondary | ICD-10-CM | POA: Diagnosis not present

## 2023-06-22 DIAGNOSIS — L89152 Pressure ulcer of sacral region, stage 2: Secondary | ICD-10-CM | POA: Diagnosis not present

## 2023-06-22 DIAGNOSIS — Z992 Dependence on renal dialysis: Secondary | ICD-10-CM | POA: Diagnosis not present

## 2023-06-22 DIAGNOSIS — L8990 Pressure ulcer of unspecified site, unspecified stage: Secondary | ICD-10-CM | POA: Insufficient documentation

## 2023-06-22 DIAGNOSIS — R54 Age-related physical debility: Secondary | ICD-10-CM | POA: Diagnosis present

## 2023-06-22 DIAGNOSIS — Z8261 Family history of arthritis: Secondary | ICD-10-CM

## 2023-06-22 DIAGNOSIS — R64 Cachexia: Secondary | ICD-10-CM | POA: Diagnosis present

## 2023-06-22 DIAGNOSIS — Z87891 Personal history of nicotine dependence: Secondary | ICD-10-CM

## 2023-06-22 DIAGNOSIS — Y830 Surgical operation with transplant of whole organ as the cause of abnormal reaction of the patient, or of later complication, without mention of misadventure at the time of the procedure: Secondary | ICD-10-CM | POA: Diagnosis present

## 2023-06-22 DIAGNOSIS — Z711 Person with feared health complaint in whom no diagnosis is made: Secondary | ICD-10-CM | POA: Diagnosis not present

## 2023-06-22 DIAGNOSIS — Z7960 Long term (current) use of unspecified immunomodulators and immunosuppressants: Secondary | ICD-10-CM

## 2023-06-22 DIAGNOSIS — D631 Anemia in chronic kidney disease: Secondary | ICD-10-CM | POA: Diagnosis present

## 2023-06-22 DIAGNOSIS — F909 Attention-deficit hyperactivity disorder, unspecified type: Secondary | ICD-10-CM | POA: Diagnosis present

## 2023-06-22 DIAGNOSIS — N529 Male erectile dysfunction, unspecified: Secondary | ICD-10-CM | POA: Diagnosis present

## 2023-06-22 DIAGNOSIS — I350 Nonrheumatic aortic (valve) stenosis: Secondary | ICD-10-CM | POA: Diagnosis not present

## 2023-06-22 DIAGNOSIS — N186 End stage renal disease: Secondary | ICD-10-CM | POA: Diagnosis present

## 2023-06-22 DIAGNOSIS — I071 Rheumatic tricuspid insufficiency: Secondary | ICD-10-CM | POA: Diagnosis not present

## 2023-06-22 DIAGNOSIS — Z954 Presence of other heart-valve replacement: Secondary | ICD-10-CM | POA: Diagnosis not present

## 2023-06-22 DIAGNOSIS — Z6825 Body mass index (BMI) 25.0-25.9, adult: Secondary | ICD-10-CM

## 2023-06-22 DIAGNOSIS — R1310 Dysphagia, unspecified: Secondary | ICD-10-CM | POA: Diagnosis not present

## 2023-06-22 DIAGNOSIS — G629 Polyneuropathy, unspecified: Secondary | ICD-10-CM | POA: Diagnosis present

## 2023-06-22 DIAGNOSIS — J3801 Paralysis of vocal cords and larynx, unilateral: Secondary | ICD-10-CM | POA: Diagnosis not present

## 2023-06-22 DIAGNOSIS — I480 Paroxysmal atrial fibrillation: Secondary | ICD-10-CM | POA: Diagnosis present

## 2023-06-22 DIAGNOSIS — Z79899 Other long term (current) drug therapy: Secondary | ICD-10-CM

## 2023-06-22 DIAGNOSIS — I272 Pulmonary hypertension, unspecified: Secondary | ICD-10-CM | POA: Diagnosis present

## 2023-06-22 DIAGNOSIS — N185 Chronic kidney disease, stage 5: Secondary | ICD-10-CM | POA: Diagnosis not present

## 2023-06-22 DIAGNOSIS — Z888 Allergy status to other drugs, medicaments and biological substances status: Secondary | ICD-10-CM

## 2023-06-22 DIAGNOSIS — M898X9 Other specified disorders of bone, unspecified site: Secondary | ICD-10-CM | POA: Diagnosis not present

## 2023-06-22 DIAGNOSIS — D696 Thrombocytopenia, unspecified: Secondary | ICD-10-CM | POA: Diagnosis present

## 2023-06-22 DIAGNOSIS — I459 Conduction disorder, unspecified: Secondary | ICD-10-CM | POA: Diagnosis not present

## 2023-06-22 DIAGNOSIS — I5023 Acute on chronic systolic (congestive) heart failure: Secondary | ICD-10-CM | POA: Diagnosis not present

## 2023-06-22 DIAGNOSIS — I509 Heart failure, unspecified: Secondary | ICD-10-CM | POA: Diagnosis not present

## 2023-06-22 DIAGNOSIS — D84821 Immunodeficiency due to drugs: Secondary | ICD-10-CM | POA: Diagnosis present

## 2023-06-22 DIAGNOSIS — Y838 Other surgical procedures as the cause of abnormal reaction of the patient, or of later complication, without mention of misadventure at the time of the procedure: Secondary | ICD-10-CM | POA: Diagnosis not present

## 2023-06-22 DIAGNOSIS — G9341 Metabolic encephalopathy: Secondary | ICD-10-CM | POA: Diagnosis not present

## 2023-06-22 DIAGNOSIS — Z833 Family history of diabetes mellitus: Secondary | ICD-10-CM

## 2023-06-22 DIAGNOSIS — I50811 Acute right heart failure: Secondary | ICD-10-CM | POA: Diagnosis not present

## 2023-06-22 DIAGNOSIS — I2729 Other secondary pulmonary hypertension: Secondary | ICD-10-CM | POA: Diagnosis not present

## 2023-06-22 DIAGNOSIS — E440 Moderate protein-calorie malnutrition: Secondary | ICD-10-CM | POA: Insufficient documentation

## 2023-06-22 DIAGNOSIS — F411 Generalized anxiety disorder: Secondary | ICD-10-CM | POA: Diagnosis present

## 2023-06-22 DIAGNOSIS — G4733 Obstructive sleep apnea (adult) (pediatric): Secondary | ICD-10-CM | POA: Diagnosis present

## 2023-06-22 DIAGNOSIS — F410 Panic disorder [episodic paroxysmal anxiety] without agoraphobia: Secondary | ICD-10-CM | POA: Diagnosis not present

## 2023-06-22 DIAGNOSIS — T82510A Breakdown (mechanical) of surgically created arteriovenous fistula, initial encounter: Secondary | ICD-10-CM | POA: Diagnosis not present

## 2023-06-22 HISTORY — PX: MITRAL VALVE REPAIR: SHX2039

## 2023-06-22 HISTORY — PX: AORTIC VALVE REPLACEMENT: SHX41

## 2023-06-22 HISTORY — DX: Sleep apnea, unspecified: G47.30

## 2023-06-22 HISTORY — PX: TRICUSPID VALVE REPLACEMENT: SHX816

## 2023-06-22 HISTORY — PX: MAZE: SHX5063

## 2023-06-22 HISTORY — DX: Herpesviral infection, unspecified: B00.9

## 2023-06-22 HISTORY — DX: Atherosclerotic heart disease of native coronary artery without angina pectoris: I25.10

## 2023-06-22 HISTORY — PX: TEE WITHOUT CARDIOVERSION: SHX5443

## 2023-06-22 LAB — BPAM FFP
Blood Product Expiration Date: 202408182359
Blood Product Expiration Date: 202408182359
ISSUE DATE / TIME: 202408151517
ISSUE DATE / TIME: 202408151517
Unit Type and Rh: 6200
Unit Type and Rh: 6200

## 2023-06-22 LAB — POCT I-STAT 7, (LYTES, BLD GAS, ICA,H+H)
Acid-Base Excess: 0 mmol/L (ref 0.0–2.0)
Acid-Base Excess: 1 mmol/L (ref 0.0–2.0)
Acid-base deficit: 2 mmol/L (ref 0.0–2.0)
Acid-base deficit: 1 mmol/L (ref 0.0–2.0)
Acid-base deficit: 5 mmol/L — ABNORMAL HIGH (ref 0.0–2.0)
Acid-base deficit: 5 mmol/L — ABNORMAL HIGH (ref 0.0–2.0)
Acid-base deficit: 4 mmol/L — ABNORMAL HIGH (ref 0.0–2.0)
Bicarbonate: 25.6 mmol/L (ref 20.0–28.0)
Bicarbonate: 25.4 mmol/L (ref 20.0–28.0)
Bicarbonate: 22.9 mmol/L (ref 20.0–28.0)
Bicarbonate: 25.2 mmol/L (ref 20.0–28.0)
Bicarbonate: 19.5 mmol/L — ABNORMAL LOW (ref 20.0–28.0)
Bicarbonate: 20.4 mmol/L (ref 20.0–28.0)
Bicarbonate: 20.6 mmol/L (ref 20.0–28.0)
Calcium, Ion: 1.04 mmol/L — ABNORMAL LOW (ref 1.15–1.40)
Calcium, Ion: 0.95 mmol/L — ABNORMAL LOW (ref 1.15–1.40)
Calcium, Ion: 0.94 mmol/L — ABNORMAL LOW (ref 1.15–1.40)
Calcium, Ion: 0.9 mmol/L — ABNORMAL LOW (ref 1.15–1.40)
Calcium, Ion: 1.03 mmol/L — ABNORMAL LOW (ref 1.15–1.40)
Calcium, Ion: 1.05 mmol/L — ABNORMAL LOW (ref 1.15–1.40)
Calcium, Ion: 1.08 mmol/L — ABNORMAL LOW (ref 1.15–1.40)
HCT: 35 % — ABNORMAL LOW (ref 39.0–52.0)
HCT: 27 % — ABNORMAL LOW (ref 39.0–52.0)
HCT: 27 % — ABNORMAL LOW (ref 39.0–52.0)
HCT: 27 % — ABNORMAL LOW (ref 39.0–52.0)
HCT: 30 % — ABNORMAL LOW (ref 39.0–52.0)
HCT: 33 % — ABNORMAL LOW (ref 39.0–52.0)
HCT: 31 % — ABNORMAL LOW (ref 39.0–52.0)
Hemoglobin: 11.9 g/dL — ABNORMAL LOW (ref 13.0–17.0)
Hemoglobin: 9.2 g/dL — ABNORMAL LOW (ref 13.0–17.0)
Hemoglobin: 9.2 g/dL — ABNORMAL LOW (ref 13.0–17.0)
Hemoglobin: 9.2 g/dL — ABNORMAL LOW (ref 13.0–17.0)
Hemoglobin: 10.2 g/dL — ABNORMAL LOW (ref 13.0–17.0)
Hemoglobin: 11.2 g/dL — ABNORMAL LOW (ref 13.0–17.0)
Hemoglobin: 10.5 g/dL — ABNORMAL LOW (ref 13.0–17.0)
O2 Saturation: 100 %
O2 Saturation: 100 %
O2 Saturation: 100 %
O2 Saturation: 100 %
O2 Saturation: 100 %
O2 Saturation: 100 %
O2 Saturation: 98 %
Patient temperature: 36.7
Patient temperature: 35.9
Potassium: 5 mmol/L (ref 3.5–5.1)
Potassium: 5.5 mmol/L — ABNORMAL HIGH (ref 3.5–5.1)
Potassium: 6 mmol/L — ABNORMAL HIGH (ref 3.5–5.1)
Potassium: 6.5 mmol/L (ref 3.5–5.1)
Potassium: 4.1 mmol/L (ref 3.5–5.1)
Potassium: 4.8 mmol/L (ref 3.5–5.1)
Potassium: 3.9 mmol/L (ref 3.5–5.1)
Sodium: 139 mmol/L (ref 135–145)
Sodium: 138 mmol/L (ref 135–145)
Sodium: 139 mmol/L (ref 135–145)
Sodium: 141 mmol/L (ref 135–145)
Sodium: 143 mmol/L (ref 135–145)
Sodium: 145 mmol/L (ref 135–145)
Sodium: 144 mmol/L (ref 135–145)
TCO2: 27 mmol/L (ref 22–32)
TCO2: 27 mmol/L (ref 22–32)
TCO2: 24 mmol/L (ref 22–32)
TCO2: 27 mmol/L (ref 22–32)
TCO2: 21 mmol/L — ABNORMAL LOW (ref 22–32)
TCO2: 22 mmol/L (ref 22–32)
TCO2: 22 mmol/L (ref 22–32)
pCO2 arterial: 45.4 mmHg (ref 32–48)
pCO2 arterial: 38.6 mmHg (ref 32–48)
pCO2 arterial: 40.1 mmHg (ref 32–48)
pCO2 arterial: 45.7 mmHg (ref 32–48)
pCO2 arterial: 34.8 mmHg (ref 32–48)
pCO2 arterial: 38.6 mmHg (ref 32–48)
pCO2 arterial: 34 mmHg (ref 32–48)
pH, Arterial: 7.359 (ref 7.35–7.45)
pH, Arterial: 7.426 (ref 7.35–7.45)
pH, Arterial: 7.365 (ref 7.35–7.45)
pH, Arterial: 7.35 (ref 7.35–7.45)
pH, Arterial: 7.329 — ABNORMAL LOW (ref 7.35–7.45)
pH, Arterial: 7.357 (ref 7.35–7.45)
pH, Arterial: 7.386 (ref 7.35–7.45)
pO2, Arterial: 350 mmHg — ABNORMAL HIGH (ref 83–108)
pO2, Arterial: 399 mmHg — ABNORMAL HIGH (ref 83–108)
pO2, Arterial: 387 mmHg — ABNORMAL HIGH (ref 83–108)
pO2, Arterial: 318 mmHg — ABNORMAL HIGH (ref 83–108)
pO2, Arterial: 173 mmHg — ABNORMAL HIGH (ref 83–108)
pO2, Arterial: 297 mmHg — ABNORMAL HIGH (ref 83–108)
pO2, Arterial: 97 mmHg (ref 83–108)

## 2023-06-22 LAB — APTT: aPTT: 61 seconds — ABNORMAL HIGH (ref 24–36)

## 2023-06-22 LAB — POCT I-STAT, CHEM 8
BUN: 54 mg/dL — ABNORMAL HIGH (ref 6–20)
BUN: 44 mg/dL — ABNORMAL HIGH (ref 6–20)
BUN: 45 mg/dL — ABNORMAL HIGH (ref 6–20)
BUN: 46 mg/dL — ABNORMAL HIGH (ref 6–20)
BUN: 42 mg/dL — ABNORMAL HIGH (ref 6–20)
BUN: 41 mg/dL — ABNORMAL HIGH (ref 6–20)
BUN: 42 mg/dL — ABNORMAL HIGH (ref 6–20)
Calcium, Ion: 0.92 mmol/L — ABNORMAL LOW (ref 1.15–1.40)
Calcium, Ion: 1 mmol/L — ABNORMAL LOW (ref 1.15–1.40)
Calcium, Ion: 1.01 mmol/L — ABNORMAL LOW (ref 1.15–1.40)
Calcium, Ion: 0.98 mmol/L — ABNORMAL LOW (ref 1.15–1.40)
Calcium, Ion: 0.9 mmol/L — ABNORMAL LOW (ref 1.15–1.40)
Calcium, Ion: 0.87 mmol/L — CL (ref 1.15–1.40)
Calcium, Ion: 0.89 mmol/L — CL (ref 1.15–1.40)
Chloride: 103 mmol/L (ref 98–111)
Chloride: 104 mmol/L (ref 98–111)
Chloride: 102 mmol/L (ref 98–111)
Chloride: 102 mmol/L (ref 98–111)
Chloride: 104 mmol/L (ref 98–111)
Chloride: 106 mmol/L (ref 98–111)
Chloride: 107 mmol/L (ref 98–111)
Creatinine, Ser: 10.5 mg/dL — ABNORMAL HIGH (ref 0.61–1.24)
Creatinine, Ser: 9.8 mg/dL — ABNORMAL HIGH (ref 0.61–1.24)
Creatinine, Ser: 10.4 mg/dL — ABNORMAL HIGH (ref 0.61–1.24)
Creatinine, Ser: 10.1 mg/dL — ABNORMAL HIGH (ref 0.61–1.24)
Creatinine, Ser: 8.7 mg/dL — ABNORMAL HIGH (ref 0.61–1.24)
Creatinine, Ser: 9 mg/dL — ABNORMAL HIGH (ref 0.61–1.24)
Creatinine, Ser: 9.1 mg/dL — ABNORMAL HIGH (ref 0.61–1.24)
Glucose, Bld: 73 mg/dL (ref 70–99)
Glucose, Bld: 91 mg/dL (ref 70–99)
Glucose, Bld: 78 mg/dL (ref 70–99)
Glucose, Bld: 105 mg/dL — ABNORMAL HIGH (ref 70–99)
Glucose, Bld: 97 mg/dL (ref 70–99)
Glucose, Bld: 90 mg/dL (ref 70–99)
Glucose, Bld: 76 mg/dL (ref 70–99)
HCT: 36 % — ABNORMAL LOW (ref 39.0–52.0)
HCT: 34 % — ABNORMAL LOW (ref 39.0–52.0)
HCT: 32 % — ABNORMAL LOW (ref 39.0–52.0)
HCT: 26 % — ABNORMAL LOW (ref 39.0–52.0)
HCT: 26 % — ABNORMAL LOW (ref 39.0–52.0)
HCT: 26 % — ABNORMAL LOW (ref 39.0–52.0)
HCT: 28 % — ABNORMAL LOW (ref 39.0–52.0)
Hemoglobin: 12.2 g/dL — ABNORMAL LOW (ref 13.0–17.0)
Hemoglobin: 11.6 g/dL — ABNORMAL LOW (ref 13.0–17.0)
Hemoglobin: 10.9 g/dL — ABNORMAL LOW (ref 13.0–17.0)
Hemoglobin: 8.8 g/dL — ABNORMAL LOW (ref 13.0–17.0)
Hemoglobin: 8.8 g/dL — ABNORMAL LOW (ref 13.0–17.0)
Hemoglobin: 8.8 g/dL — ABNORMAL LOW (ref 13.0–17.0)
Hemoglobin: 9.5 g/dL — ABNORMAL LOW (ref 13.0–17.0)
Potassium: 5.4 mmol/L — ABNORMAL HIGH (ref 3.5–5.1)
Potassium: 4.9 mmol/L (ref 3.5–5.1)
Potassium: 4.9 mmol/L (ref 3.5–5.1)
Potassium: 6 mmol/L — ABNORMAL HIGH (ref 3.5–5.1)
Potassium: 5.7 mmol/L — ABNORMAL HIGH (ref 3.5–5.1)
Potassium: 6.1 mmol/L — ABNORMAL HIGH (ref 3.5–5.1)
Potassium: 6.4 mmol/L (ref 3.5–5.1)
Sodium: 138 mmol/L (ref 135–145)
Sodium: 139 mmol/L (ref 135–145)
Sodium: 138 mmol/L (ref 135–145)
Sodium: 137 mmol/L (ref 135–145)
Sodium: 142 mmol/L (ref 135–145)
Sodium: 142 mmol/L (ref 135–145)
Sodium: 140 mmol/L (ref 135–145)
TCO2: 26 mmol/L (ref 22–32)
TCO2: 26 mmol/L (ref 22–32)
TCO2: 25 mmol/L (ref 22–32)
TCO2: 26 mmol/L (ref 22–32)
TCO2: 29 mmol/L (ref 22–32)
TCO2: 27 mmol/L (ref 22–32)
TCO2: 23 mmol/L (ref 22–32)

## 2023-06-22 LAB — BASIC METABOLIC PANEL
Anion gap: 14 (ref 5–15)
Anion gap: 13 (ref 5–15)
BUN: 46 mg/dL — ABNORMAL HIGH (ref 6–20)
BUN: 47 mg/dL — ABNORMAL HIGH (ref 6–20)
CO2: 20 mmol/L — ABNORMAL LOW (ref 22–32)
CO2: 18 mmol/L — ABNORMAL LOW (ref 22–32)
Calcium: 7.5 mg/dL — ABNORMAL LOW (ref 8.9–10.3)
Calcium: 7.5 mg/dL — ABNORMAL LOW (ref 8.9–10.3)
Chloride: 109 mmol/L (ref 98–111)
Chloride: 106 mmol/L (ref 98–111)
Creatinine, Ser: 8.62 mg/dL — ABNORMAL HIGH (ref 0.61–1.24)
Creatinine, Ser: 8.73 mg/dL — ABNORMAL HIGH (ref 0.61–1.24)
GFR, Estimated: 7 mL/min — ABNORMAL LOW (ref >60)
GFR, Estimated: 7 mL/min — ABNORMAL LOW (ref >60)
Glucose, Bld: 30 mg/dL — CL (ref 70–99)
Glucose, Bld: 132 mg/dL — ABNORMAL HIGH (ref 70–99)
Potassium: 3.8 mmol/L (ref 3.5–5.1)
Potassium: 4.6 mmol/L (ref 3.5–5.1)
Sodium: 143 mmol/L (ref 135–145)
Sodium: 137 mmol/L (ref 135–145)

## 2023-06-22 LAB — POCT I-STAT EG7
Acid-base deficit: 2 mmol/L (ref 0.0–2.0)
Bicarbonate: 24.7 mmol/L (ref 20.0–28.0)
Calcium, Ion: 1 mmol/L — ABNORMAL LOW (ref 1.15–1.40)
HCT: 26 % — ABNORMAL LOW (ref 39.0–52.0)
Hemoglobin: 8.8 g/dL — ABNORMAL LOW (ref 13.0–17.0)
O2 Saturation: 93 %
Potassium: 6 mmol/L — ABNORMAL HIGH (ref 3.5–5.1)
Sodium: 138 mmol/L (ref 135–145)
TCO2: 26 mmol/L (ref 22–32)
pCO2, Ven: 50.2 mmHg (ref 44–60)
pH, Ven: 7.3 (ref 7.25–7.43)
pO2, Ven: 74 mmHg — ABNORMAL HIGH (ref 32–45)

## 2023-06-22 LAB — CBC
HCT: 29.1 % — ABNORMAL LOW (ref 39.0–52.0)
HCT: 27.1 % — ABNORMAL LOW (ref 39.0–52.0)
Hemoglobin: 9.2 g/dL — ABNORMAL LOW (ref 13.0–17.0)
Hemoglobin: 8.6 g/dL — ABNORMAL LOW (ref 13.0–17.0)
MCH: 32.6 pg (ref 26.0–34.0)
MCH: 32.7 pg (ref 26.0–34.0)
MCHC: 31.6 g/dL (ref 30.0–36.0)
MCHC: 31.7 g/dL (ref 30.0–36.0)
MCV: 103.2 fL — ABNORMAL HIGH (ref 80.0–100.0)
MCV: 103 fL — ABNORMAL HIGH (ref 80.0–100.0)
Platelets: 88 10*3/uL — ABNORMAL LOW (ref 150–400)
Platelets: 82 10*3/uL — ABNORMAL LOW (ref 150–400)
RBC: 2.82 MIL/uL — ABNORMAL LOW (ref 4.22–5.81)
RBC: 2.63 MIL/uL — ABNORMAL LOW (ref 4.22–5.81)
RDW: 14.7 % (ref 11.5–15.5)
RDW: 14.6 % (ref 11.5–15.5)
WBC: 8.5 10*3/uL (ref 4.0–10.5)
WBC: 8.5 10*3/uL (ref 4.0–10.5)
nRBC: 0 % (ref 0.0–0.2)
nRBC: 0 % (ref 0.0–0.2)

## 2023-06-22 LAB — ECHO INTRAOPERATIVE TEE
AV Mean grad: 10 mmHg
Height: 75 in
Weight: 3350.99 oz

## 2023-06-22 LAB — PREPARE FRESH FROZEN PLASMA
Unit division: 0
Unit division: 0

## 2023-06-22 LAB — GLUCOSE, CAPILLARY
Glucose-Capillary: 30 mg/dL — CL (ref 70–99)
Glucose-Capillary: 101 mg/dL — ABNORMAL HIGH (ref 70–99)
Glucose-Capillary: 72 mg/dL (ref 70–99)
Glucose-Capillary: 67 mg/dL — ABNORMAL LOW (ref 70–99)
Glucose-Capillary: 129 mg/dL — ABNORMAL HIGH (ref 70–99)
Glucose-Capillary: 101 mg/dL — ABNORMAL HIGH (ref 70–99)
Glucose-Capillary: 103 mg/dL — ABNORMAL HIGH (ref 70–99)

## 2023-06-22 LAB — PROTIME-INR
INR: 2.8 — ABNORMAL HIGH (ref 0.8–1.2)
Prothrombin Time: 29.3 seconds — ABNORMAL HIGH (ref 11.4–15.2)

## 2023-06-22 LAB — HEMOGLOBIN AND HEMATOCRIT, BLOOD
HCT: 26 % — ABNORMAL LOW (ref 39.0–52.0)
Hemoglobin: 8.1 g/dL — ABNORMAL LOW (ref 13.0–17.0)

## 2023-06-22 LAB — MAGNESIUM
Magnesium: 2.2 mg/dL (ref 1.7–2.4)
Magnesium: 1.9 mg/dL (ref 1.7–2.4)

## 2023-06-22 LAB — FIBRINOGEN
Fibrinogen: 150 mg/dL — ABNORMAL LOW (ref 210–475)
Fibrinogen: 142 mg/dL — ABNORMAL LOW (ref 210–475)

## 2023-06-22 LAB — PLATELET COUNT: Platelets: 102 10*3/uL — ABNORMAL LOW (ref 150–400)

## 2023-06-22 SURGERY — TRICUSPID VALVE REPAIR
Anesthesia: General | Site: Chest

## 2023-06-22 MED ORDER — INSULIN REGULAR(HUMAN) IN NACL 100-0.9 UT/100ML-% IV SOLN: INTRAVENOUS | Status: DC

## 2023-06-22 MED ORDER — PANTOPRAZOLE SODIUM 40 MG IV SOLR: 40.0000 mg | Freq: Every day | INTRAVENOUS | Status: DC

## 2023-06-22 MED ORDER — LACTATED RINGERS IV SOLN: INTRAVENOUS | Status: DC

## 2023-06-22 MED ORDER — CEFAZOLIN SODIUM-DEXTROSE 2-4 GM/100ML-% IV SOLN
2.0000 g | Freq: Three times a day (TID) | INTRAVENOUS | Status: DC
  Administered 2023-06-23 (×2): 2 g via INTRAVENOUS
  Filled 2023-06-22: qty 100

## 2023-06-22 MED ORDER — PANTOPRAZOLE SODIUM 40 MG IV SOLR
40.0000 mg | Freq: Two times a day (BID) | INTRAVENOUS | Status: DC
  Administered 2023-06-22 – 2023-06-25 (×6): 40 mg via INTRAVENOUS
  Filled 2023-06-22 (×6): qty 10

## 2023-06-22 MED ORDER — PHENYLEPHRINE HCL-NACL 20-0.9 MG/250ML-% IV SOLN: 0.0000 ug/min | INTRAVENOUS | Status: DC

## 2023-06-22 MED ORDER — ASPIRIN 325 MG PO TBEC: 325.0000 mg | DELAYED_RELEASE_TABLET | Freq: Every day | ORAL | Status: DC

## 2023-06-22 MED ORDER — ACYCLOVIR 200 MG/5ML PO SUSP
200.0000 mg | ORAL | Status: DC
  Administered 2023-06-23: 200 mg via ORAL
  Filled 2023-06-22 (×3): qty 10

## 2023-06-22 MED ORDER — PHENYLEPHRINE HCL (PRESSORS) 10 MG/ML IV SOLN
INTRAVENOUS | Status: AC
  Filled 2023-06-22: qty 1

## 2023-06-22 MED ORDER — MAGNESIUM SULFATE 4 GM/100ML IV SOLN
4.0000 g | Freq: Once | INTRAVENOUS | Status: DC
  Filled 2023-06-22: qty 100

## 2023-06-22 MED ORDER — DEXMEDETOMIDINE HCL IN NACL 400 MCG/100ML IV SOLN
0.0000 ug/kg/h | INTRAVENOUS | Status: DC
  Administered 2023-06-22: 0.7 ug/kg/h via INTRAVENOUS
  Administered 2023-06-23: 0.6 ug/kg/h via INTRAVENOUS
  Administered 2023-06-23: 0.3 ug/kg/h via INTRAVENOUS
  Filled 2023-06-22 (×3): qty 100

## 2023-06-22 MED ORDER — MIDAZOLAM HCL (PF) 5 MG/ML IJ SOLN
INTRAMUSCULAR | Status: DC | PRN
  Administered 2023-06-22 (×5): 2 mg via INTRAVENOUS

## 2023-06-22 MED ORDER — DEXTROSE 50 % IV SOLN
INTRAVENOUS | Status: DC | PRN
  Administered 2023-06-22: .5 via INTRAVENOUS

## 2023-06-22 MED ORDER — SODIUM CHLORIDE 0.9 % IV SOLN: INTRAVENOUS | Status: DC | PRN

## 2023-06-22 MED ORDER — ACETAMINOPHEN 160 MG/5ML PO SOLN
1000.0000 mg | Freq: Four times a day (QID) | ORAL | Status: DC
  Administered 2023-06-23 – 2023-06-24 (×6): 1000 mg
  Filled 2023-06-22 (×6): qty 40.6

## 2023-06-22 MED ORDER — CHLORHEXIDINE GLUCONATE 4 % EX SOLN: 30.0000 mL | CUTANEOUS | Status: DC

## 2023-06-22 MED ORDER — ORAL CARE MOUTH RINSE
15.0000 mL | OROMUCOSAL | Status: DC
  Administered 2023-06-22 – 2023-06-24 (×20): 15 mL via OROMUCOSAL

## 2023-06-22 MED ORDER — SODIUM CHLORIDE 0.9 % IV SOLN
20.0000 ug | Freq: Once | INTRAVENOUS | Status: AC
  Administered 2023-06-22: 20 ug via INTRAVENOUS
  Filled 2023-06-22: qty 5

## 2023-06-22 MED ORDER — PROPOFOL 10 MG/ML IV BOLUS
INTRAVENOUS | Status: AC
  Filled 2023-06-22: qty 20

## 2023-06-22 MED ORDER — CALCIUM CHLORIDE 10 % IV SOLN
INTRAVENOUS | Status: DC | PRN
  Administered 2023-06-22 (×7): 100 mg via INTRAVENOUS

## 2023-06-22 MED ORDER — HEPARIN SODIUM (PORCINE) 1000 UNIT/ML IJ SOLN
INTRAMUSCULAR | Status: AC
  Filled 2023-06-22: qty 10

## 2023-06-22 MED ORDER — PHENYLEPHRINE HCL-NACL 20-0.9 MG/250ML-% IV SOLN
INTRAVENOUS | Status: AC
  Filled 2023-06-22: qty 250

## 2023-06-22 MED ORDER — ASPIRIN 81 MG PO CHEW
324.0000 mg | CHEWABLE_TABLET | Freq: Once | ORAL | Status: DC
  Filled 2023-06-22: qty 4

## 2023-06-22 MED ORDER — ACYCLOVIR 200 MG PO CAPS: 200.0000 mg | ORAL_CAPSULE | ORAL | Status: DC

## 2023-06-22 MED ORDER — CARVEDILOL 12.5 MG PO TABS
ORAL_TABLET | ORAL | Status: AC
  Filled 2023-06-22: qty 2

## 2023-06-22 MED ORDER — LIDOCAINE 2% (20 MG/ML) 5 ML SYRINGE
INTRAMUSCULAR | Status: AC
  Filled 2023-06-22: qty 5

## 2023-06-22 MED ORDER — DOCUSATE SODIUM 100 MG PO CAPS
200.0000 mg | ORAL_CAPSULE | Freq: Every day | ORAL | Status: DC
  Filled 2023-06-22 (×2): qty 2

## 2023-06-22 MED ORDER — BISACODYL 5 MG PO TBEC
10.0000 mg | DELAYED_RELEASE_TABLET | Freq: Every day | ORAL | Status: DC
  Administered 2023-06-23 – 2023-06-30 (×6): 10 mg via ORAL
  Filled 2023-06-22 (×8): qty 2

## 2023-06-22 MED ORDER — EPHEDRINE SULFATE-NACL 50-0.9 MG/10ML-% IV SOSY
PREFILLED_SYRINGE | INTRAVENOUS | Status: DC | PRN
  Administered 2023-06-22: 5 mg via INTRAVENOUS
  Administered 2023-06-22: 2.5 mg via INTRAVENOUS
  Administered 2023-06-22 (×2): 5 mg via INTRAVENOUS

## 2023-06-22 MED ORDER — HEPARIN SODIUM (PORCINE) 1000 UNIT/ML IJ SOLN
INTRAMUSCULAR | Status: DC | PRN
  Administered 2023-06-22: 5 [IU] via INTRAVENOUS
  Administered 2023-06-22: 34000 [IU] via INTRAVENOUS

## 2023-06-22 MED ORDER — CHLORHEXIDINE GLUCONATE 0.12 % MT SOLN
15.0000 mL | OROMUCOSAL | Status: AC
  Administered 2023-06-22: 15 mL via OROMUCOSAL
  Filled 2023-06-22: qty 15

## 2023-06-22 MED ORDER — SODIUM CHLORIDE 0.9% IV SOLUTION: Freq: Once | INTRAVENOUS | Status: DC

## 2023-06-22 MED ORDER — ACYCLOVIR 200 MG/5ML PO SUSP
200.0000 mg | ORAL | Status: DC
  Administered 2023-06-22: 200 mg
  Filled 2023-06-22: qty 10

## 2023-06-22 MED ORDER — DEXTROSE 50 % IV SOLN
0.0000 mL | INTRAVENOUS | Status: DC | PRN
  Administered 2023-06-22 (×2): 50 mL via INTRAVENOUS
  Filled 2023-06-22 (×4): qty 50

## 2023-06-22 MED ORDER — FENTANYL CITRATE (PF) 250 MCG/5ML IJ SOLN
INTRAMUSCULAR | Status: AC
  Filled 2023-06-22: qty 5

## 2023-06-22 MED ORDER — METOPROLOL TARTRATE 12.5 MG HALF TABLET: 12.5000 mg | ORAL_TABLET | Freq: Two times a day (BID) | ORAL | Status: DC

## 2023-06-22 MED ORDER — POTASSIUM CHLORIDE 10 MEQ/50ML IV SOLN: 10.0000 meq | INTRAVENOUS | Status: AC

## 2023-06-22 MED ORDER — 0.9 % SODIUM CHLORIDE (POUR BTL) OPTIME
TOPICAL | Status: DC | PRN
  Administered 2023-06-22: 7000 mL

## 2023-06-22 MED ORDER — ORAL CARE MOUTH RINSE: 15.0000 mL | Freq: Once | OROMUCOSAL | Status: AC

## 2023-06-22 MED ORDER — VASOPRESSIN 20 UNITS/100 ML INFUSION FOR SHOCK
0.0000 [IU]/min | INTRAVENOUS | Status: DC
  Administered 2023-06-22 – 2023-06-27 (×13): 0.04 [IU]/min via INTRAVENOUS
  Administered 2023-06-27: 0.02 [IU]/min via INTRAVENOUS
  Filled 2023-06-22 (×14): qty 100

## 2023-06-22 MED ORDER — PROTAMINE SULFATE 10 MG/ML IV SOLN
INTRAVENOUS | Status: AC
  Filled 2023-06-22: qty 25

## 2023-06-22 MED ORDER — ONDANSETRON HCL 4 MG/2ML IJ SOLN
4.0000 mg | Freq: Four times a day (QID) | INTRAMUSCULAR | Status: DC | PRN
  Administered 2023-07-02 – 2023-07-20 (×8): 4 mg via INTRAVENOUS
  Filled 2023-06-22 (×10): qty 2

## 2023-06-22 MED ORDER — VASOPRESSIN 20 UNITS/100 ML INFUSION FOR SHOCK
INTRAVENOUS | Status: DC | PRN
  Administered 2023-06-22: .03 [IU]/min via INTRAVENOUS

## 2023-06-22 MED ORDER — PANTOPRAZOLE SODIUM 40 MG PO TBEC: 40.0000 mg | DELAYED_RELEASE_TABLET | Freq: Every day | ORAL | Status: DC

## 2023-06-22 MED ORDER — CHLORHEXIDINE GLUCONATE CLOTH 2 % EX PADS
6.0000 | MEDICATED_PAD | Freq: Every day | CUTANEOUS | Status: DC
  Administered 2023-06-22 – 2023-07-30 (×39): 6 via TOPICAL

## 2023-06-22 MED ORDER — SODIUM CHLORIDE 0.9 % IV SOLN: INTRAVENOUS | Status: DC

## 2023-06-22 MED ORDER — ~~LOC~~ CARDIAC SURGERY, PATIENT & FAMILY EDUCATION
Freq: Once | Status: DC
  Filled 2023-06-22: qty 1

## 2023-06-22 MED ORDER — CLONAZEPAM 1 MG PO TABS
1.0000 mg | ORAL_TABLET | Freq: Every day | ORAL | Status: DC | PRN
  Administered 2023-06-23 – 2023-06-26 (×2): 1 mg via ORAL
  Filled 2023-06-22 (×2): qty 1

## 2023-06-22 MED ORDER — SODIUM CHLORIDE 0.9% FLUSH
3.0000 mL | Freq: Two times a day (BID) | INTRAVENOUS | Status: DC
  Administered 2023-06-23 – 2023-07-30 (×66): 3 mL via INTRAVENOUS

## 2023-06-22 MED ORDER — ONDANSETRON HCL 4 MG/2ML IJ SOLN
INTRAMUSCULAR | Status: DC | PRN
  Administered 2023-06-22: 4 mg via INTRAVENOUS

## 2023-06-22 MED ORDER — TRAMADOL HCL 50 MG PO TABS
50.0000 mg | ORAL_TABLET | ORAL | Status: DC | PRN
  Administered 2023-06-24: 50 mg via ORAL
  Administered 2023-06-25: 100 mg via ORAL
  Filled 2023-06-22: qty 2
  Filled 2023-06-22: qty 1

## 2023-06-22 MED ORDER — SODIUM CHLORIDE 0.9% FLUSH: 3.0000 mL | INTRAVENOUS | Status: DC | PRN

## 2023-06-22 MED ORDER — FENTANYL CITRATE (PF) 250 MCG/5ML IJ SOLN
INTRAMUSCULAR | Status: DC | PRN
  Administered 2023-06-22: 50 ug via INTRAVENOUS
  Administered 2023-06-22: 100 ug via INTRAVENOUS
  Administered 2023-06-22: 50 ug via INTRAVENOUS
  Administered 2023-06-22: 150 ug via INTRAVENOUS
  Administered 2023-06-22 (×2): 100 ug via INTRAVENOUS
  Administered 2023-06-22: 50 ug via INTRAVENOUS
  Administered 2023-06-22 (×2): 100 ug via INTRAVENOUS
  Administered 2023-06-22 (×4): 50 ug via INTRAVENOUS

## 2023-06-22 MED ORDER — PLASMA-LYTE A IV SOLN
INTRAVENOUS | Status: DC | PRN
  Administered 2023-06-22: 500 mL

## 2023-06-22 MED ORDER — CEFAZOLIN SODIUM-DEXTROSE 2-4 GM/100ML-% IV SOLN
2.0000 g | Freq: Three times a day (TID) | INTRAVENOUS | Status: DC
  Administered 2023-06-22: 2 g via INTRAVENOUS
  Filled 2023-06-22 (×2): qty 100

## 2023-06-22 MED ORDER — ASPIRIN 81 MG PO CHEW
324.0000 mg | CHEWABLE_TABLET | Freq: Every day | ORAL | Status: DC
  Administered 2023-06-23: 324 mg
  Filled 2023-06-22: qty 4

## 2023-06-22 MED ORDER — METOPROLOL TARTRATE 25 MG/10 ML ORAL SUSPENSION: 12.5000 mg | Freq: Two times a day (BID) | ORAL | Status: DC

## 2023-06-22 MED ORDER — LIDOCAINE HCL (CARDIAC) PF 100 MG/5ML IV SOSY
PREFILLED_SYRINGE | INTRAVENOUS | Status: DC | PRN
  Administered 2023-06-22: 60 mg via INTRAVENOUS

## 2023-06-22 MED ORDER — METOPROLOL TARTRATE 12.5 MG HALF TABLET: 12.5000 mg | ORAL_TABLET | Freq: Once | ORAL | Status: DC

## 2023-06-22 MED ORDER — NITROGLYCERIN IN D5W 200-5 MCG/ML-% IV SOLN: 0.0000 ug/min | INTRAVENOUS | Status: DC

## 2023-06-22 MED ORDER — LIDOCAINE-PRILOCAINE 2.5-2.5 % EX CREA
1.0000 | TOPICAL_CREAM | CUTANEOUS | Status: DC
  Administered 2023-06-23 – 2023-06-28 (×3): 1 via TOPICAL
  Filled 2023-06-22: qty 5

## 2023-06-22 MED ORDER — BISACODYL 10 MG RE SUPP: 10.0000 mg | Freq: Every day | RECTAL | Status: DC

## 2023-06-22 MED ORDER — OXYCODONE HCL 5 MG PO TABS
5.0000 mg | ORAL_TABLET | ORAL | Status: DC | PRN
  Administered 2023-06-23 – 2023-06-24 (×3): 5 mg via ORAL
  Filled 2023-06-22 (×3): qty 1

## 2023-06-22 MED ORDER — VANCOMYCIN HCL 1000 MG IV SOLR
INTRAVENOUS | Status: DC | PRN
  Administered 2023-06-22: 1000 mL

## 2023-06-22 MED ORDER — INSULIN ASPART 100 UNIT/ML IJ SOLN
0.0000 [IU] | INTRAMUSCULAR | Status: DC
  Administered 2023-06-23 (×2): 2 [IU] via SUBCUTANEOUS
  Administered 2023-06-24: 1 [IU] via SUBCUTANEOUS
  Administered 2023-06-24 – 2023-07-03 (×25): 2 [IU] via SUBCUTANEOUS
  Administered 2023-07-03: 4 [IU] via SUBCUTANEOUS
  Administered 2023-07-03 – 2023-07-04 (×6): 2 [IU] via SUBCUTANEOUS
  Administered 2023-07-04: 4 [IU] via SUBCUTANEOUS
  Administered 2023-07-05 – 2023-07-16 (×38): 2 [IU] via SUBCUTANEOUS

## 2023-06-22 MED ORDER — EPINEPHRINE HCL 5 MG/250ML IV SOLN IN NS
0.5000 ug/min | INTRAVENOUS | Status: DC
  Administered 2023-06-22: 4 ug/min via INTRAVENOUS
  Administered 2023-06-23 (×2): 6 ug/min via INTRAVENOUS
  Administered 2023-06-24: 8 ug/min via INTRAVENOUS
  Administered 2023-06-24 – 2023-06-26 (×8): 13 ug/min via INTRAVENOUS
  Administered 2023-06-27: 9 ug/min via INTRAVENOUS
  Administered 2023-06-27: 13 ug/min via INTRAVENOUS
  Administered 2023-06-27 – 2023-06-28 (×3): 9 ug/min via INTRAVENOUS
  Administered 2023-06-29 (×2): 14 ug/min via INTRAVENOUS
  Administered 2023-06-29: 11 ug/min via INTRAVENOUS
  Administered 2023-06-29: 10 ug/min via INTRAVENOUS
  Administered 2023-06-30: 13 ug/min via INTRAVENOUS
  Administered 2023-06-30: 12 ug/min via INTRAVENOUS
  Administered 2023-06-30: 15 ug/min via INTRAVENOUS
  Administered 2023-07-01: 10 ug/min via INTRAVENOUS
  Administered 2023-07-01: 13 ug/min via INTRAVENOUS
  Administered 2023-07-01: 8 ug/min via INTRAVENOUS
  Administered 2023-07-02 (×2): 7 ug/min via INTRAVENOUS
  Administered 2023-07-03: 13 ug/min via INTRAVENOUS
  Administered 2023-07-03: 14 ug/min via INTRAVENOUS
  Administered 2023-07-03: 11 ug/min via INTRAVENOUS
  Administered 2023-07-03: 15 ug/min via INTRAVENOUS
  Administered 2023-07-04: 18 ug/min via INTRAVENOUS
  Administered 2023-07-04: 10 ug/min via INTRAVENOUS
  Administered 2023-07-04: 18 ug/min via INTRAVENOUS
  Administered 2023-07-05: 11 ug/min via INTRAVENOUS
  Administered 2023-07-05: 13 ug/min via INTRAVENOUS
  Administered 2023-07-05: 10 ug/min via INTRAVENOUS
  Administered 2023-07-06 (×2): 20 ug/min via INTRAVENOUS
  Administered 2023-07-06: 18 ug/min via INTRAVENOUS
  Administered 2023-07-06: 16 ug/min via INTRAVENOUS
  Administered 2023-07-06: 20 ug/min via INTRAVENOUS
  Administered 2023-07-06: 18 ug/min via INTRAVENOUS
  Administered 2023-07-07: 16 ug/min via INTRAVENOUS
  Administered 2023-07-07: 20 ug/min via INTRAVENOUS
  Administered 2023-07-07: 17 ug/min via INTRAVENOUS
  Administered 2023-07-07: 16 ug/min via INTRAVENOUS
  Administered 2023-07-08 (×2): 15 ug/min via INTRAVENOUS
  Administered 2023-07-08: 16 ug/min via INTRAVENOUS
  Administered 2023-07-08: 12 ug/min via INTRAVENOUS
  Administered 2023-07-09: 13 ug/min via INTRAVENOUS
  Filled 2023-06-22 (×42): qty 250
  Filled 2023-06-22: qty 500
  Filled 2023-06-22 (×10): qty 250

## 2023-06-22 MED ORDER — MIDAZOLAM HCL (PF) 10 MG/2ML IJ SOLN
INTRAMUSCULAR | Status: AC
  Filled 2023-06-22: qty 2

## 2023-06-22 MED ORDER — HEPARIN SODIUM (PORCINE) 1000 UNIT/ML IJ SOLN
INTRAMUSCULAR | Status: AC
  Filled 2023-06-22: qty 1

## 2023-06-22 MED ORDER — ROCURONIUM BROMIDE 10 MG/ML (PF) SYRINGE
PREFILLED_SYRINGE | INTRAVENOUS | Status: AC
  Filled 2023-06-22: qty 10

## 2023-06-22 MED ORDER — ATORVASTATIN CALCIUM 10 MG PO TABS
10.0000 mg | ORAL_TABLET | Freq: Every day | ORAL | Status: DC
  Administered 2023-06-23 – 2023-06-24 (×2): 10 mg
  Filled 2023-06-22 (×2): qty 1

## 2023-06-22 MED ORDER — PROPOFOL 10 MG/ML IV BOLUS
INTRAVENOUS | Status: DC | PRN
Start: 2023-06-22 — End: 2023-06-22
  Administered 2023-06-22: 20 mg via INTRAVENOUS
  Administered 2023-06-22: 100 mg via INTRAVENOUS

## 2023-06-22 MED ORDER — CHLORHEXIDINE GLUCONATE 0.12 % MT SOLN: 15.0000 mL | Freq: Once | OROMUCOSAL | Status: DC

## 2023-06-22 MED ORDER — ACETAMINOPHEN 160 MG/5ML PO SOLN
650.0000 mg | Freq: Once | ORAL | Status: AC
  Administered 2023-06-22: 650 mg
  Filled 2023-06-22: qty 20.3

## 2023-06-22 MED ORDER — PROTAMINE SULFATE 10 MG/ML IV SOLN
INTRAVENOUS | Status: AC
  Filled 2023-06-22: qty 15

## 2023-06-22 MED ORDER — ROCURONIUM BROMIDE 10 MG/ML (PF) SYRINGE
PREFILLED_SYRINGE | INTRAVENOUS | Status: DC | PRN
  Administered 2023-06-22 (×5): 50 mg via INTRAVENOUS

## 2023-06-22 MED ORDER — ORAL CARE MOUTH RINSE: 15.0000 mL | OROMUCOSAL | Status: DC | PRN

## 2023-06-22 MED ORDER — SODIUM CHLORIDE 0.45 % IV SOLN: INTRAVENOUS | Status: DC | PRN

## 2023-06-22 MED ORDER — ATORVASTATIN CALCIUM 10 MG PO TABS: 10.0000 mg | ORAL_TABLET | Freq: Every day | ORAL | Status: DC

## 2023-06-22 MED ORDER — ONDANSETRON HCL 4 MG/2ML IJ SOLN
INTRAMUSCULAR | Status: AC
  Filled 2023-06-22: qty 2

## 2023-06-22 MED ORDER — ACETAMINOPHEN 500 MG PO TABS
1000.0000 mg | ORAL_TABLET | Freq: Four times a day (QID) | ORAL | Status: DC
  Administered 2023-06-24 – 2023-06-25 (×3): 1000 mg via ORAL
  Filled 2023-06-22 (×2): qty 2

## 2023-06-22 MED ORDER — ALBUMIN HUMAN 5 % IV SOLN
250.0000 mL | INTRAVENOUS | Status: DC | PRN
  Administered 2023-06-22 (×3): 12.5 g via INTRAVENOUS
  Filled 2023-06-22 (×3): qty 250

## 2023-06-22 MED ORDER — VANCOMYCIN HCL IN DEXTROSE 1-5 GM/200ML-% IV SOLN
1000.0000 mg | Freq: Once | INTRAVENOUS | Status: AC
  Administered 2023-06-22: 1000 mg via INTRAVENOUS
  Filled 2023-06-22: qty 200

## 2023-06-22 MED ORDER — DEXTROSE 50 % IV SOLN
INTRAVENOUS | Status: AC
  Filled 2023-06-22: qty 50

## 2023-06-22 MED ORDER — MORPHINE SULFATE (PF) 2 MG/ML IV SOLN
1.0000 mg | INTRAVENOUS | Status: DC | PRN
  Administered 2023-06-23 (×2): 2 mg via INTRAVENOUS
  Filled 2023-06-22 (×3): qty 1

## 2023-06-22 MED ORDER — PHENYLEPHRINE 80 MCG/ML (10ML) SYRINGE FOR IV PUSH (FOR BLOOD PRESSURE SUPPORT)
PREFILLED_SYRINGE | INTRAVENOUS | Status: AC
  Filled 2023-06-22: qty 10

## 2023-06-22 MED ORDER — MIDAZOLAM HCL 2 MG/2ML IJ SOLN
2.0000 mg | INTRAMUSCULAR | Status: DC | PRN
  Administered 2023-06-23 – 2023-06-24 (×2): 2 mg via INTRAVENOUS
  Filled 2023-06-22 (×4): qty 2

## 2023-06-22 MED ORDER — METOCLOPRAMIDE HCL 5 MG/ML IJ SOLN
10.0000 mg | Freq: Four times a day (QID) | INTRAMUSCULAR | Status: AC
  Administered 2023-06-22 – 2023-06-23 (×6): 10 mg via INTRAVENOUS
  Filled 2023-06-22 (×6): qty 2

## 2023-06-22 MED ORDER — AMIODARONE HCL 200 MG PO TABS
400.0000 mg | ORAL_TABLET | Freq: Two times a day (BID) | ORAL | Status: DC
  Administered 2023-06-23: 400 mg via ORAL
  Filled 2023-06-22: qty 2

## 2023-06-22 MED ORDER — METOPROLOL TARTRATE 5 MG/5ML IV SOLN: 2.5000 mg | INTRAVENOUS | Status: DC | PRN

## 2023-06-22 MED ORDER — CHLORHEXIDINE GLUCONATE 0.12 % MT SOLN
15.0000 mL | Freq: Once | OROMUCOSAL | Status: AC
  Administered 2023-06-22: 15 mL via OROMUCOSAL
  Filled 2023-06-22: qty 15

## 2023-06-22 MED ORDER — SODIUM CHLORIDE 0.9 % IV SOLN: 250.0000 mL | INTRAVENOUS | Status: DC

## 2023-06-22 MED ORDER — CALCIUM CHLORIDE 10 % IV SOLN
INTRAVENOUS | Status: AC
  Filled 2023-06-22: qty 10

## 2023-06-22 MED ORDER — PROTAMINE SULFATE 10 MG/ML IV SOLN
INTRAVENOUS | Status: DC | PRN
  Administered 2023-06-22: 330 mg via INTRAVENOUS
  Administered 2023-06-22: 20 mg via INTRAVENOUS

## 2023-06-22 MED ORDER — EPHEDRINE 5 MG/ML INJ
INTRAVENOUS | Status: AC
  Filled 2023-06-22: qty 5

## 2023-06-22 MED ORDER — NOREPINEPHRINE 4 MG/250ML-% IV SOLN
0.0000 ug/min | INTRAVENOUS | Status: DC
  Administered 2023-06-22: 7 ug/min via INTRAVENOUS
  Filled 2023-06-22 (×2): qty 250

## 2023-06-22 MED ORDER — NITROGLYCERIN 0.2 MG/ML ON CALL CATH LAB
INTRAVENOUS | Status: DC | PRN
  Administered 2023-06-22 (×3): 10 ug via INTRAVENOUS

## 2023-06-22 SURGICAL SUPPLY — 98 items
ADAPTER CARDIO PERF ANTE/RETRO (ADAPTER) ×3 IMPLANT
ADPR PRFSN 84XANTGRD RTRGD (ADAPTER) ×2
ANTIFOG SOL W/FOAM PAD STRL (MISCELLANEOUS) ×2
BAG DECANTER FOR FLEXI CONT (MISCELLANEOUS) ×3 IMPLANT
BAND ANLPLS SIMULUS 30 (Prosthesis & Implant Heart) ×2 IMPLANT
BAND ANLPLS SMRG SMLS (Prosthesis & Implant Heart) ×2 IMPLANT
BLADE CLIPPER SURG (BLADE) ×3 IMPLANT
BLADE STERNUM SYSTEM 6 (BLADE) ×3 IMPLANT
BLADE SURG 15 STRL LF DISP TIS (BLADE) IMPLANT
BLADE SURG 15 STRL SS (BLADE) ×2
BOOT SUTURE VASCULAR YLW (MISCELLANEOUS) ×2
CANISTER SUCT 3000ML PPV (MISCELLANEOUS) ×3 IMPLANT
CANN PRFSN 3/8XCNCT ST RT ANG (MISCELLANEOUS) ×2
CANN PRFSN 3/8XRT ANG TPR 14 (MISCELLANEOUS) ×2
CANNULA NON VENT 20FR 12 (CANNULA) ×3 IMPLANT
CANNULA NON VENT 22FR 12 (CANNULA) ×3 IMPLANT
CANNULA PRFSN 3/8XCNCT RT ANG (MISCELLANEOUS) IMPLANT
CANNULA PRFSN 3/8XRT ANG TPR14 (MISCELLANEOUS) IMPLANT
CANNULA SUMP PERICARDIAL (CANNULA) ×3 IMPLANT
CANNULA VEN MTL TIP RT (MISCELLANEOUS) ×4
CATH HEART VENT LEFT (CATHETERS) ×3 IMPLANT
CATH RETROPLEGIA CORONARY 14FR (CATHETERS) ×3 IMPLANT
CATH ROBINSON RED A/P 18FR (CATHETERS) ×9 IMPLANT
CATH THOR STR 32F SOFT 20 RADI (CATHETERS) ×3 IMPLANT
CATH THORACIC 28FR RT ANG (CATHETERS) ×3 IMPLANT
CLAMP ISOLATOR SYNERGY LG (MISCELLANEOUS) IMPLANT
CLAMP SUTURE YELLOW 5 PAIRS (MISCELLANEOUS) ×3 IMPLANT
CLIP TI MEDIUM 24 (CLIP) IMPLANT
CLIP TI WIDE RED SMALL 24 (CLIP) IMPLANT
CONN 1/2X3/8X3/8 Y GISH (MISCELLANEOUS) IMPLANT
DEVICE SUT CK QUICK LOAD MINI (Prosthesis & Implant Heart) IMPLANT
DRAPE CV SPLIT W-CLR ANES SCRN (DRAPES) ×3 IMPLANT
DRAPE INCISE IOBAN 66X45 STRL (DRAPES) ×3 IMPLANT
DRAPE PERI GROIN 82X75IN TIB (DRAPES) ×3 IMPLANT
DRSG AQUACEL AG ADV 3.5X10 (GAUZE/BANDAGES/DRESSINGS) ×3 IMPLANT
ELECT CAUTERY BLADE 6.4 (BLADE) ×3 IMPLANT
ELECT REM PT RETURN 9FT ADLT (ELECTROSURGICAL) ×4
ELECTRODE REM PT RTRN 9FT ADLT (ELECTROSURGICAL) ×6 IMPLANT
FELT TEFLON 1X6 (MISCELLANEOUS) ×6 IMPLANT
GAUZE SPONGE 4X4 12PLY STRL (GAUZE/BANDAGES/DRESSINGS) ×6 IMPLANT
GOWN STRL REUS W/ TWL LRG LVL3 (GOWN DISPOSABLE) ×18 IMPLANT
GOWN STRL REUS W/TWL LRG LVL3 (GOWN DISPOSABLE) ×12
HEMOSTAT SURGICEL 2X14 (HEMOSTASIS) IMPLANT
INSERT FOGARTY XLG (MISCELLANEOUS) ×3 IMPLANT
KIT BASIN OR (CUSTOM PROCEDURE TRAY) ×3 IMPLANT
KIT SUCTION CATH 14FR (SUCTIONS) ×3 IMPLANT
KIT SUT CK MINI COMBO 4X17 (Prosthesis & Implant Heart) IMPLANT
KIT TURNOVER KIT B (KITS) ×3 IMPLANT
LINE VENT (MISCELLANEOUS) IMPLANT
NS IRRIG 1000ML POUR BTL (IV SOLUTION) ×18 IMPLANT
ORGANIZER SUTURE GABBAY-FRATER (MISCELLANEOUS) ×3 IMPLANT
PACK E OPEN HEART (SUTURE) ×3 IMPLANT
PACK OPEN HEART (CUSTOM PROCEDURE TRAY) ×3 IMPLANT
PAD ARMBOARD 7.5X6 YLW CONV (MISCELLANEOUS) ×6 IMPLANT
PAD ELECT DEFIB RADIOL ZOLL (MISCELLANEOUS) ×3 IMPLANT
PENCIL BUTTON HOLSTER BLD 10FT (ELECTRODE) ×3 IMPLANT
POSITIONER HEAD DONUT 9IN (MISCELLANEOUS) ×3 IMPLANT
PROBE CRYO2-ABLATION MALLABLE (MISCELLANEOUS) IMPLANT
PUNCH AORTIC ROTATE 4.5MM 8IN (MISCELLANEOUS) ×3 IMPLANT
RING ANNULOPLASTY SIMULUS 30 (Prosthesis & Implant Heart) IMPLANT
RING TRICUSPID T30 (Prosthesis & Implant Heart) IMPLANT
SET MPS 3-ND DEL (MISCELLANEOUS) IMPLANT
SOLUTION ANTFG W/FOAM PAD STRL (MISCELLANEOUS) ×3 IMPLANT
SPONGE T-LAP 18X18 ~~LOC~~+RFID (SPONGE) IMPLANT
SUT BONE WAX W31G (SUTURE) ×3 IMPLANT
SUT EB EXC GRN/WHT 2-0 V-5 (SUTURE) ×6 IMPLANT
SUT ETHIBOND 2 0 SH (SUTURE) ×6
SUT ETHIBOND 2 0 SH 36X2 (SUTURE) IMPLANT
SUT ETHIBOND 2-0 30 1/2 V-5 (SUTURE) IMPLANT
SUT ETHIBOND 3 0 SH 1 (SUTURE) IMPLANT
SUT GORETEX CV-5 36 IN (SUTURE) IMPLANT
SUT GORETEX CV-5 PH-17 (SUTURE) IMPLANT
SUT MNCRL AB 4-0 PS2 18 (SUTURE) ×6 IMPLANT
SUT PROLENE 4 0 RB 1 (SUTURE) ×12
SUT PROLENE 4 0 SH DA (SUTURE) ×9 IMPLANT
SUT PROLENE 4-0 RB1 .5 CRCL 36 (SUTURE) ×6 IMPLANT
SUT PROLENE 5 0 C 1 36 (SUTURE) IMPLANT
SUT PROLENE 5 0 RB 2 (SUTURE) IMPLANT
SUT PROLENE 6 0 C 1 30 (SUTURE) IMPLANT
SUT STEEL 6MS V (SUTURE) ×3 IMPLANT
SUT STEEL SZ 6 DBL 3X14 BALL (SUTURE) ×6 IMPLANT
SUT VIC AB 0 CTX 36 (SUTURE) ×8
SUT VIC AB 0 CTX36XBRD ANTBCTR (SUTURE) ×6 IMPLANT
SUT VIC AB 2-0 CT1 27 (SUTURE) ×8
SUT VIC AB 2-0 CT1 TAPERPNT 27 (SUTURE) ×6 IMPLANT
SYR BULB IRRIG 60ML STRL (SYRINGE) IMPLANT
SYSTEM SAHARA CHEST DRAIN ATS (WOUND CARE) ×3 IMPLANT
TAG SUTURE CLAMP YLW 5PR (MISCELLANEOUS) ×2
TAPE CLOTH SURG 4X10 WHT LF (GAUZE/BANDAGES/DRESSINGS) IMPLANT
TAPE PAPER 2X10 WHT MICROPORE (GAUZE/BANDAGES/DRESSINGS) IMPLANT
TOWEL GREEN STERILE (TOWEL DISPOSABLE) ×3 IMPLANT
TOWEL GREEN STERILE FF (TOWEL DISPOSABLE) ×3 IMPLANT
TRAY FOLEY SLVR 16FR TEMP STAT (SET/KITS/TRAYS/PACK) ×3 IMPLANT
TUBE CONNECTING 20X1/4 (TUBING) IMPLANT
UNDERPAD 30X36 HEAVY ABSORB (UNDERPADS AND DIAPERS) ×3 IMPLANT
VALVE AORTIC SZ27 INSP/RESIL (Valve) IMPLANT
VENT LEFT HEART 12002 (CATHETERS) ×2
WATER STERILE IRR 1000ML POUR (IV SOLUTION) ×6 IMPLANT

## 2023-06-22 NOTE — Anesthesia Procedure Notes (Signed)
Central Venous Catheter Insertion Performed by: Mariann Barter, MD, attending Start/End8/15/2024 7:10 AM, 06/22/2023 7:25 AM Patient location: Pre-op. Preanesthetic checklist: patient identified, IV checked, site marked, risks and benefits discussed, surgical consent, monitors and equipment checked, pre-op evaluation, timeout performed and anesthesia consent Position: Trendelenburg Lidocaine 1% used for infiltration and patient sedated Hand hygiene performed , maximum sterile barriers used  and Seldinger technique used Catheter size: 8.5 Fr Central line and PA cath was placed.MAC introducer Swan type:thermodilution Procedure performed using ultrasound guided technique. Ultrasound Notes:anatomy identified, needle tip was noted to be adjacent to the nerve/plexus identified, no ultrasound evidence of intravascular and/or intraneural injection and image(s) printed for medical record Attempts: 1 Following insertion, Biopatch. Post procedure assessment: blood return through all ports and no air  Patient tolerated the procedure well with no immediate complications.

## 2023-06-22 NOTE — Progress Notes (Signed)
Patient ID: [name], male   DOB: 1970/11/21, 52 y.o.   MRN: [id number]  TCTS Evening Rounds:   Hemodynamically stable epi 4, NE 7, vaso CI = 2.79  Asleep on vent.   Urine output good  CT output low OG drainage is bloody. Observe and irrigate.  CBC    Component Value Date/Time   WBC 2.2 (L) 06/20/2023 1130   RBC 3.53 (L) 06/20/2023 1130   HGB 11.2 (L) 06/22/2023 1422   HCT 33.0 (L) 06/22/2023 1422   PLT 102 (L) 06/22/2023 1232   MCV 102.0 (H) 06/20/2023 1130   MCH 32.6 06/20/2023 1130   MCHC 31.9 06/20/2023 1130   RDW 14.7 06/20/2023 1130   LYMPHSABS 2.2 03/15/2020 0947   MONOABS 0.5 03/15/2020 0947   EOSABS 0.2 03/15/2020 0947   BASOSABS 0.0 03/15/2020 0947     BMET    Component Value Date/Time   NA 145 06/22/2023 1422   K 4.1 06/22/2023 1422   CL 107 06/22/2023 1250   CO2 23 06/20/2023 1130   GLUCOSE 76 06/22/2023 1250   BUN 42 (H) 06/22/2023 1250   CREATININE 9.10 (H) 06/22/2023 1250   CALCIUM 9.0 06/20/2023 1130   CALCIUM 9.8 02/18/2011 1007   GFRNONAA 5 (L) 06/20/2023 1130     A/P:  Stable postop course. Continue current plans. Let nephrology know he is here.

## 2023-06-22 NOTE — Transfer of Care (Signed)
Immediate Anesthesia Transfer of Care Note  Patient: Ilo Tartaglione  Procedure(s) Performed: TRICUSPID VALVE REPAIR MC3 Ring size 30 MITRAL VALVE REPAIR (MVR) utilizing Simulis Band size 30mm (Chest) AORTIC VALVE REPLACEMENT (AVR) inspiris valve size 27 (Chest) MAZE TRANSESOPHAGEAL ECHOCARDIOGRAM  Patient Location: PACU  Anesthesia Type:General  Level of Consciousness: sedated and Patient remains intubated per anesthesia plan  Airway & Oxygen Therapy: Patient remains intubated per anesthesia plan and Patient placed on Ventilator (see vital sign flow sheet for setting)  Post-op Assessment: Report given to RN and Post -op Vital signs reviewed and stable  Post vital signs: Reviewed and stable  Last Vitals:  Vitals Value Taken Time  BP 103/63 (76)   Temp 35.9 C 06/22/23 1558  Pulse 89 06/22/23 1558  Resp 16 06/22/23 1558  SpO2 99 % 06/22/23 1558  Vitals shown include unfiled device data.  Last Pain:  Vitals:   06/22/23 0626  TempSrc:   PainSc: 7       Patients Stated Pain Goal: 2 (06/22/23 2952)  Complications: There were no known notable events for this encounter.

## 2023-06-22 NOTE — Progress Notes (Signed)
  Echocardiogram Echocardiogram Transesophageal has been performed.  Delcie Roch 06/22/2023, 2:51 PM

## 2023-06-22 NOTE — Discharge Instructions (Signed)
Discharge Instructions:  1. You may shower, please wash incisions daily with soap and water and keep dry.  If you wish to cover wounds with dressing you may do so but please keep clean and change daily.  No tub baths or swimming until incisions have completely healed.  If your incisions become red or develop any drainage please call our office at [phone]  2. No Driving until cleared by Dr. Weldner's office and you are no longer using narcotic pain medications  3. Monitor your weight daily.. Please use the same scale and weigh at same time... If you gain 5-10 lbs in 48 hours with associated lower extremity swelling, please contact our office at [phone]  4. Fever of 101.5 for at least 24 hours with no source, please contact our office at [phone]  5. Activity- up as tolerated, please walk at least 3 times per day.  Avoid strenuous activity, no lifting, pushing, or pulling with your arms over 8-10 lbs for a minimum of 6 weeks  6. If any questions or concerns arise, please do not hesitate to contact our office at [phone]  

## 2023-06-22 NOTE — Op Note (Signed)
CARDIOVASCULAR SURGERY OPERATIVE NOTE  06/22/2023 Durward Parcel [id number]  Surgeon:  Ashley Akin, MD  First Assistant: Aloha Gell [facility]                               An experienced assistant was required given the complexity of this surgery and the standard of surgical care. The assistant was needed for exposure, dissection, suctioning, retraction of delicate tissues and sutures, instrument exchange and for overall help during this procedure.     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                                            Severe Tricuspid valve regurgitation                                             Moderate Aortic Stenosis                                             Atrial fibrillation  Postoperative Diagnosis:  Same   Procedure: Complex mitral valve repair utilizing neo chords x 3 to the A1 and A2 segments, magic suture to the posterior comissure, 30 mm Simulus semi rigid band Tricuspid valve annuloplasty ring with a 30 mm Tricuspid physio ring Aortic valve replacement with a 27 mm Inspiris Pericardial valve Full Right and Left Maze with occlusion of the Left atrial appendage  Anesthesia:  General Endotracheal   Clinical History/Surgical Indication:  52 yo male with chronic renal failure on dialysis who has severe MR and TR and mild AS with NYHA class 1-2 symptoms with mildly reduced LV function and moderate PHTN. We discussed his surgical issues that I feel would include Mitral valve repair with a 90% success rate with the flail etiology and TV repair with annuloplasty. I dont feel his AS warrants being addressed   Findings: The heart has a slightly depressed left and right ventricular function.  The mitral valve had moderately to severe regurgitation from a 1 into a 2 prolapse with evidence of flail of the A1 segment.  The tricuspid valve had severe regurgitation and the aortic valve had mild to moderate stenosis however there is  significant calcification and thickening and stiffness of the valve.  It is felt that this would also require replacement.  At the conclusion of the procedure there was improved ventricular function with no mitral regurgitation and mild tricuspid regurgitation patient was in AV synchronous rhythm.  Aortic valve was well-seated with a 5 mm gradient.  Preparation:  The patient was seen in the preoperative holding area and the correct patient, correct operation were confirmed with the patient after reviewing the medical record and catheterization. The consent was signed by me. Preoperative antibiotics were given. A pulmonary arterial line and radial arterial line were placed by the anesthesia team. The patient was taken back to the operating room and positioned supine on the operating room table. After being placed under general endotracheal anesthesia by the anesthesia team a foley catheter was placed. The neck, chest, abdomen, and both legs were  prepped with betadine soap and solution and draped in the usual sterile manner. A surgical time-out was taken and the correct patient and operative procedure were confirmed with the nursing and anesthesia staff.  Operation: Median sternotomy incision was then created and a sternal provide the sternal saw.  Simultaneously heparin was delivered and a pericardial well developed.  The aorta was cannulated with a 22 French aortic cannula and a 31 French angled venous cannula and 28 French angled cannula both placed in the inferior vena cava and superior vena cava respectively.  Clinically confirmation of anticoagulation cardiopulmonary bypass was instituted.  The right pulmonary veins were then isolated with the AtriCure clamp and 3 sets of 2 punch created.  Following this the antegrade cardioplegia catheter was placed in ascending aorta and the aortic cross-clamp was placed.  Cold blood potassium cardioplegia was delivered in antegrade for total appropriate volume for the  [place] blood cardioplegia.  Additional dose with given just prior to placement of the aortic valve and reanimation dose was delivered prior to cross-clamp removal. The left pulmonary veins were then isolated with the AtriCure clamp with 3 sets of 2 burns and the base of the left atrial appendage was also given with radiofrequency lesion x 2.  The left atrium was then opened in the intra-atrial groove and it was noted that there was calcification of the left atrial wall.  Utilizing the cryoprobe the roof and floor lesions were created along with the mitral annular lesion.  Left atrial appendage was then oversewn linearly with a 4-0 Prolene suture.  3-0 Ethibond sutures were then placed around the annulus in preparation of a planned not including the anterior annulus.  Following this 3 needle cords were then placed in the papillary muscles and the valve was sized to a 30 mm stimulus band.  This was secured with the cor knot system and the 3 needle cores were brought out through a 1 and 2 on either side of the tip of A2.  These were adjusted to length with saline load testing and with appropriate coaptation there appeared to be some prolapse still of the posterior commissure this was controlled with a Magic suture of 6-0 Prolene suture.  With excellent coaptation the left atrium was closed over the ventricular sump and the aortic valve was then addressed by an aortotomy in the ascending aorta.  The aortic valve was photographed resected and the annulus decalcified and the 2-0 Tevdek sutures were placed numbering 17 with the pledget below the annulus.  The 27 mm Inspiris valve is then secured utilizing cor knot system and the aortotomy was closed with a running 4 oh pledgeted Prolene suture.  Following this the right atrium was opened vertically and the Medtronic clamp was utilized to perform the superior vena cava and inferior vena cava lesions x 2 and additional out the right atrial appendage.  Following this  visualization of the tricuspid valve was obtained and utilizing 3-0 Ethibond sutures these were placed in the anterior annulus posterior annulus and at the crux of the septal leaflet and a 30 mm tricuspid physio ring was secured.  This was performed with a core knot system with the patient in a headdown position aortic cross-clamp was removed and the aorta and the atriotomy closed with a running 4-0 Prolene suture. Ventricular and atrial pacing wires were placed about the inferior stab incision secured.  With adequate de-airing the left ventricular sump was removed and sites oversewn necessary. Patient was then weaned from cardiopulmonary bypass  and epinephrine support with adequate hemodynamics protamine was 11 the patient decannulated sites oversewn necessary.  Chest tubes were inferior stab and secured.  With adequate hemostasis sternum was reapproximated with a standstill wire and a presternal subcutaneous tissue and skin close musculoskeletal suture however there appeared to be too much bleeding from the chest tubes and wound was reopened and the sternal wires removed.  It was evident that the inferior vena cava cannulation site had some ongoing bleeding which was controlled with a 4.1 suture there was just ongoing coagulopathy which was controlled with blood product support with adequate hemostasis the sternum was again reapproximated with interrupted standstill wires and the presternal subcutaneous tissue and skin closed with layers observable suture.

## 2023-06-22 NOTE — Anesthesia Postprocedure Evaluation (Signed)
Anesthesia Post Note  Patient: [name]  Procedure(s) Performed: TRICUSPID VALVE REPAIR MC3 Ring size 30 MITRAL VALVE REPAIR (MVR) utilizing Simulis Band size 30mm (Chest) AORTIC VALVE REPLACEMENT (AVR) inspiris valve size 27 (Chest) MAZE TRANSESOPHAGEAL ECHOCARDIOGRAM     Anesthesia Type: General Level of consciousness: sedated Pain management: pain level controlled Vital Signs Assessment: post-procedure vital signs reviewed and stable Respiratory status: patient on ventilator - see flowsheet for VS and patient remains intubated per anesthesia plan Cardiovascular status: stable Anesthetic complications: no   There were no known notable events for this encounter.  Last Vitals:  Vitals:   06/22/23 0559  BP: (!) 161/59  Pulse: 75  Resp: 18  Temp: 37 C  SpO2: 100%    Last Pain:  Vitals:   06/22/23 0626  TempSrc:   PainSc: 7                Transported to ICU fully monitored throughout. Handoff to receiving ICU RN.   Mariann Barter

## 2023-06-22 NOTE — Progress Notes (Signed)
1625 CBG 30                   Bartle, MD rounding. At bedside   1631 1 AMP D50 given   1650 Repeat CBG 101

## 2023-06-22 NOTE — Anesthesia Procedure Notes (Addendum)
Procedure Name: Intubation Date/Time: 06/22/2023 8:01 AM  Performed by: Waynard Edwards, CRNAPre-anesthesia Checklist: Patient identified, Emergency Drugs available, Suction available and Patient being monitored Patient Re-evaluated:Patient Re-evaluated prior to induction Oxygen Delivery Method: Circle System Utilized Preoxygenation: Pre-oxygenation with 100% oxygen Induction Type: IV induction Ventilation: Two handed mask ventilation required and Oral airway inserted - appropriate to patient size Laryngoscope Size: Glidescope and 4 Grade View: Grade I Tube type: Oral Tube size: 8.0 mm Number of attempts: 2 Airway Equipment and Method: Oral airway and Rigid stylet Placement Confirmation: ETT inserted through vocal cords under direct vision, positive ETCO2 and breath sounds checked- equal and bilateral Secured at: 24 cm Tube secured with: Tape Dental Injury: Teeth and Oropharynx as per pre-operative assessment  Comments: Performed by sam foster srna

## 2023-06-22 NOTE — Hospital Course (Addendum)
History of Present Illness:     Pt is a very pleasant 52 yo wm who has been followed by Dr Scharlene Gloss for severe MR and TR. Pt has had increasing fatigue and when pushing himself having DOE. He is chronic hemodialysis for over 20 years and has not been having issues tolerating dialysis. He however with echo and TEE last year to have severe MR from what appears to be A1-2 flail. 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 He has mild AS with a mean gradient at cath and echo of only . He does have moderate PHTN with SPA pressures of . He was felt that the best course for his multivalvular issues was surgery and pt has been asked to discuss this with cardiac surgery. He has varying Hgb levels reportedly without GI concerns but recently had some hematuria that he will be discussing with his urologist from [facility]  Dr. Leafy Ro reviewed the patient's diagnostic studies and determined he would benefit from surgical intervention. He reviewed the patient's treatment options and the risks and benefits of surgery. [name] was agreeable to proceed with surgery.   Hospital Course:  [name] arrived at [facility] and underwent tricuspid valve repair utilizing a 30mm MC3 ring, mitral valve repair utilizing a 30mm Simulus Band, aortic valve replacement utilizing a 27mm Inspiris valve, and Maze procedure. He tolerated the procedure well and was transferred to the SICU in stable condition.  The patient was weaned to extubated on 8/17.  He required support with Vasopressin, Epinephrine, Levophed all of which were weaned as hemodynamics allowed.  The patient has ESRD on dialysis, Nephrology was consulted and being patient was requiring pressor support he would require CRRT prior to initiation of dialysis.  The critical care team placed a dialysis catheter and this was initiated on 06/23/2023.  Post operative EKG showed complete heart block.  EP consult was obtained and they recommended  close monitoring of patient's rhythm.  He required treatment with albumin and packed cells due to hypotension and post operative blood loss anemia.  He was hypokalemic and supplemented accordingly.  The patient's H/H remained low with low BP despite transfusion.  He was given an additional 2 units of packed cells.  His chest tube output decreased and his tubes were removed on 06/26/2023.  He was found to have an enlarging right pleural effusion so pulmonary placed a pigtail chest tube on 08/19. He was on HFNC during the day and unable to tolerate bi pap PRN at night. SLP evaluation was obtained and showed evidence of aspiration risk.  Additional workup with FEES showed patient to be able to start on a Dysphagia 3 diet.  His oral intake remained poor and he required Cortrak placement with initiation of feedings to help with nutrition status.  He has post operative encephalopathy/ICU delirium. He was put on Seroquel at night. Advanced heart failure was consulted and recommendations were followed accordingly. He was restarted on Vasopressin on 08/22, which was weaned off by 8/25 and he remained on Epinephrine.  He was put on empiric Vancomycin and Zosyn.  Repeat swallow evaluation was performed and the patient did not pass requiring continuation of tube feeds via Cortrak.  The patient was unable to be weaned off Epinephrine.  He required resumption of CRRT on 8/26 to help with volume management.  His pacing wires started to not fully capture.  He was taken to the EP lab and underwent placement of a temporary pacemaker.  The patient underwent repeat swallow evaluation and remained aspiration risk and was kept NPO.  The patient required resumption of Vasopressin overnight for blood pressure support.  Despite this patient's pressor requirement continued to increase.  He was started on Dobutamine on 8/28 due to significant RV dysfunction.  He also required resumption of Levophed on 8/30 to help with BP support in setting of  continued CRRT.  His tube feeds were being held due to persistent nausea.  These were slowly resumed as patient tolerated.  Palliative care consult was obtained to clarify goals of care. The advanced heart failure team was concerned that the AV fistula was contributing to persistent right heart failure.  Right heart catheterization was carried out by Dr. Gala Romney on 07/17/2023 showing severe pulmonary hypertension that was felt to be multifactorial.  There was an increase in the mean arterial pressure of 10 points with occlusion of the AV fistula with an increase in the SVR and decrease in cardiac output.  Dr. Gala Romney recommended ligation of the AV fistula and discussed this with Dr. Edilia Bo of the vascular service.  Plans were made for AV fistula ligation in the operating room on the afternoon of 07/22/2023. There was some improvement in hemodynamics over the next few days allowing some of the epinephrine to be weaned. He continued to require Midodrine and nitric oxide.  The Echo was repeated on 07/17/23 showing normal LV function.  The RV was severely dilated with moderately reduced function.  There was a large pericardial effusion but no evidence of tamponade.  The bioprosthetic aortic valve appeared to be functioning appropriately.    Coumadin anticoagulation was initiated.  The vasopressor and inotropic support was eventually weaned off.  He continued to require inhaled nitrous oxide for pulmonary hypertension.  His complete heart block persisted.  The function of the epicardial pacing wires gradually declined so a right IJ temporary pacing wire was placed.  He was evaluated by the EP service and was not felt to be candidate for permanent transvenous pacing.  He was felt to potentially be a candidate for leadless pacing but this would require general anesthesia which he was not felt able to tolerate.  Decision was made to plan for temporary-permanent pacing system as a bridge to his recovery until he was felt to  be a candidate for a  permanent system.  A nontunneled central venous catheter was placed with ultrasound guidance by Selmer Dominion, NP on 07/19/2023 for hemodialysis.  On 07/20/2023, he was noted to have a superficial abscess at the very top of the sternotomy incision.  This was easily unroofed at the bedside and local care with dressing changes established.  Sternum stable and there was no evidence of deep infection. Nutrition support continued with tube feeding by way of core track.  He had several episodes of vomiting and a KUB was obtained on 07/20/2023.  The tip of the feeding tube was noted to be in the distal stomach.  We discussed this with the nutrition team and requested advancement of the tube to try to get postpyloric.  The nutritional issues were discussed with the critical care team and decision was made to continue trying to after achieve adequate intake by the enteral route with plans to add to TPN support if he could not consistently receive enough calories by way of the feeding tube.   [name] was taken to the EP lab on 9/13 for placement of a temp-perm pacemaker. CRRT was restarted on 09/14 due to inability to  tolerate hemodialysis. Inhaled Nitric oxide was discontinued but he remained on Vaso and Midodrine 30mg  TID for blood pressure support. He had temporary wires and EPW in place due to risk of tamponade. He would require temp perm pacemaker once he stabilized. Right heart cath was done on 09/16 to assess PA and right sided pressures since he was unsuccessful transitioning from CRRT to iHD.  Right heart catheterization was performed on 07/18/2023 confirming severe pulmonary hypertension.  He remains in complete heart block with escape rhythm in the 40s.  He was followed by the electrophysiology team.  He was not felt to be an appropriate candidate for a permanent pacemaker.  CVVH continued and he continued to require considerable vasopressor support. A modified barium swallow was conducted on  07/27/2023.  Speech pathologist recommendations were for continuation of a full liquid diet  Due to [name] failure to progress, the palliative care team was re-consulted.  [name] was receptive to a discussion of goals of care but wanted his parents to be present.  A family meet was arranged for 07/29/23. He did not tolerate iHD 09/21 as had hypotension and felt terrible. He has been tolerating tube feedings. Patient, family to meet with palliative care as will likely transition to comfort care.

## 2023-06-22 NOTE — Anesthesia Preprocedure Evaluation (Addendum)
Anesthesia Evaluation  Patient identified by MRN, date of birth, ID band Patient awake and Patient confused    Airway Mallampati: II  TM Distance: >3 FB Neck ROM: Full    Dental no notable dental hx.    Pulmonary former smoker   breath sounds clear to auscultation       Cardiovascular hypertension,  Rhythm:Regular Rate:Normal + Systolic murmurs and + Diastolic murmurs    Neuro/Psych    GI/Hepatic   Endo/Other    Renal/GU      Musculoskeletal   Abdominal   Peds  Hematology   Anesthesia Other Findings   Reproductive/Obstetrics                             Anesthesia Physical Anesthesia Plan  ASA: 4  Anesthesia Plan: General   Post-op Pain Management:    Induction: Intravenous  PONV Risk Score and Plan:   Airway Management Planned: Oral ETT  Additional Equipment: Arterial line, PA Cath, TEE, 3D TEE and Ultrasound Guidance Line Placement  Intra-op Plan:   Post-operative Plan: Post-operative intubation/ventilation  Informed Consent: I have reviewed the patients History and Physical, chart, labs and discussed the procedure including the risks, benefits and alternatives for the proposed anesthesia with the patient or authorized representative who has indicated his/her understanding and acceptance.       Plan Discussed with: CRNA and Surgeon  Anesthesia Plan Comments:        Anesthesia Quick Evaluation

## 2023-06-22 NOTE — Consult Note (Signed)
NAME:  [name], MRN:  [id number], DOB:  [birth year], LOS: 0 ADMISSION DATE:  06/22/2023, CONSULTATION DATE: 06/22/2023 REFERRING MD: Dr. Leafy Ro, CHIEF COMPLAINT: Postcardiac surgery  History of Present Illness:  This is a 52 year old gentleman, past medical history of anxiety depression, bipolar disease, congestive heart failure chronic diastolic heart failure kidney disease followed by renal transplant in 1990 9 repeat renal transplant in 2014-06-29.  Patient underwent surgery today for severe mitral regurgitation severe tricuspid regurgitation and moderate aortic stenosis.  He also has atrial fibrillation received maze procedure.  He had a complex mitral valve repair a tricuspid valve annuloplasty and aortic valve replacement followed by full right and left maze procedure.  Pertinent  Medical History   Past Medical History:  Diagnosis Date   ADHD (attention deficit hyperactivity disorder)    Anemia    Anemia, chronic disease 07/13/2011   Anxiety    Arthritis    Bipolar 2 disorder (HCC) 08/18/2011   Diagnosed in June 29, 2010.    Bipolar disorder (HCC)    Blood dyscrasia    Nephrotic Anemia   Blood transfusion    Blood transfusion without reported diagnosis    Chronic diastolic CHF (congestive heart failure) (HCC)    Complication of anesthesia    Woke up intubated gets combative  afraid to be alone    Congestive heart failure (CHF) (HCC) 07/13/2011   Onset 06/29/05.  Followed by Dr. Eden Emms.  S/p cardiac catheterization in [facility] Dr. Anne Fu..  S/p cardiac catheterization in 06/29/2013 by Nishan.  Echo 06/29/13.    Depression    Dysrhythmia    A. Fib 30-Jun-2023 with DCCV in May 2024   Erectile dysfunction    ESRD on hemodialysis (HCC) 07/13/2011   Glomerulonephritis at age 33, started HD in 06-30-1995.  Deceased donor renal transplant 1998/06/29 at [facility], then kidney failed and went back on HD in 06-29-2005.  L forearm AVF . s/p repeat renal transplant 10/2014 [facility].   GI bleed 11/01/2011   Rectal bleeding with emesis/diarrhea     Heart murmur    Hemodialysis status (HCC)    Hydrocele, right 11/25/2014   Hypertension    Hypertensive emergency 01/06/2013   Hypoglycemia 01/11/2013   Influenza-like illness 12/18/2013   Mild ascending aorta dilatation (HCC)    NICM (nonischemic cardiomyopathy) (HCC)    Pericardial effusion    a. Mod by echo in 06/29/13, similar to prior.   Peripheral polyneuropathy 08/04/2016   Pneumonia    Pulmonary edema 01/06/2013   Pulmonary hypertension associated with end stage renal disease on dialysis [facility]) 07/13/2011   Renal transplant, status post 11/25/2014   Pt with Glomerulonephritis at age 78. Deceased donor renal transplant 06-29-98 right and left renal transplant 10/2014.  Followed by [facility] transplant team; nephrologist is Dr. Lonna Cobb.    Respiratory failure with hypoxia (HCC) 01/06/2013   Shortness of breath 12/12/2012   Swollen testicle 07/13/2011   URI (upper respiratory infection) 01/25/2012     Significant Hospital Events: Including procedures, antibiotic start and stop dates in addition to other pertinent events     Interim History / Subjective:  Per HPI above critically ill intubated mechanical life support is a postop  Objective   Blood pressure (!) 113/39, pulse 89, temperature (!) 96.4 F (35.8 C), resp. rate 14, height 6\' 3"  (1.905 m), weight 95 kg, SpO2 100%. PAP: (36-47)/(18-25) 43/21 CVP:  [6 mmHg-14 mmHg] 7 mmHg CO:  [6.3 L/min] 6.3 L/min CI:  [2.8 L/min/m2] 2.8 L/min/m2  Vent Mode: PRVC;SIMV;PSV FiO2 (%):  [  50 %] 50 % Set Rate:  [16 bmp] 16 bmp Vt Set:  [098 mL] 670 mL PEEP:  [5 cmH20] 5 cmH20 Pressure Support:  [10 cmH20] 10 cmH20   Intake/Output Summary (Last 24 hours) at 06/22/2023 1708 Last data filed at 06/22/2023 1700 Gross per 24 hour  Intake 5382.43 ml  Output 4850 ml  Net 532.43 ml   Filed Weights   06/22/23 0559  Weight: 95 kg    Examination: General: Middle-age male intubated critically ill on mechanical support HENT:  Endotracheal tube in place, OG tube in place with bloody output Lungs: Bilateral mechanically ventilated breath sounds Cardiovascular: Regular rate rhythm S1-S2, midline sternal incision Abdomen: Soft nontender nondistended Extremities: No significant edema Neuro: Sedated on mechanical support GU: Deferred  Resolved Hospital Problem list     Assessment & Plan:   Postop respiratory failure, as expected post procedurally currently on rapid weaning protocol. Mitral valve regurgitation, tricuspid regurgitation and aortic stenosis status post valvular repair of all 3. Bloody OG output, question injury from TEE probe Postop anemia, as expected no transfusion needed at this time Thrombocytopenia which appears chronic. History of kidney disease status post renal transplant. Postop shock and vasoplegia.  Plan: To continue to wean vasopressors as tolerated Patient is currently on vasopressin and norepinephrine and epinephrine. Start PPI with bloody OGT output.  Protonix 40 mg IV twice daily. Will continue to observe. If needed will consider involving GI but it does appear that the bloody output has slowed down. Rapid weaning protocol in place PAD guidelines sedation currently on Precedex. Pacer leads and chest tube management per T CTS.    Best Practice (right click and "Reselect all SmartList Selections" daily)   Diet/type: NPO DVT prophylaxis: prophylactic heparin  GI prophylaxis: PPI Lines: Central line Foley:  Yes, and it is still needed Code Status:  full code Last date of multidisciplinary goals of care discussion [per primary]  Labs   CBC: Recent Labs  Lab 06/20/23 1130 06/22/23 0648 06/22/23 1232 06/22/23 1250 06/22/23 1341 06/22/23 1422 06/22/23 1618  WBC 2.2*  --   --   --   --   --  8.5  HGB 11.5*   < > 8.1* 9.5* 10.2* 11.2* 9.2*  HCT 36.0*   < > 26.0* 28.0* 30.0* 33.0* 29.1*  MCV 102.0*  --   --   --   --   --  103.2*  PLT 100*  --  102*  --   --   --  88*    < > = values in this interval not displayed.    Basic Metabolic Panel: Recent Labs  Lab 06/20/23 1130 06/22/23 0648 06/22/23 0902 06/22/23 0930 06/22/23 1015 06/22/23 1038 06/22/23 1046 06/22/23 1112 06/22/23 1146 06/22/23 1220 06/22/23 1250 06/22/23 1341 06/22/23 1422  NA 135   < > 138   < > 137   < > 142   < > 142 141 140 143 145  K 5.6*   < > 4.9   < > 6.0*   < > 5.7*   < > 6.1* 6.5* 6.4* 4.8 4.1  CL 97*   < > 102  --  102  --  104  --  106  --  107  --   --   CO2 23  --   --   --   --   --   --   --   --   --   --   --   --  GLUCOSE 71   < > 78  --  105*  --  97  --  90  --  76  --   --   BUN 52*   < > 45*  --  46*  --  42*  --  41*  --  42*  --   --   CREATININE 10.48*   < > 10.40*  --  10.10*  --  8.70*  --  9.00*  --  9.10*  --   --   CALCIUM 9.0  --   --   --   --   --   --   --   --   --   --   --   --    < > = values in this interval not displayed.   GFR: Estimated Creatinine Clearance: 11.6 mL/min (A) (by C-G formula based on SCr of 9.1 mg/dL (H)). Recent Labs  Lab 06/20/23 1130 06/22/23 1618  WBC 2.2* 8.5    Liver Function Tests: Recent Labs  Lab 06/20/23 1130  AST 48*  ALT 38  ALKPHOS 94  BILITOT 0.7  PROT 9.0*  ALBUMIN 3.7   No results for input(s): "LIPASE", "AMYLASE" in the last 168 hours. No results for input(s): "AMMONIA" in the last 168 hours.  ABG    Component Value Date/Time   PHART 7.329 (L) 06/22/2023 1422   PCO2ART 38.6 06/22/2023 1422   PO2ART 297 (H) 06/22/2023 1422   HCO3 20.4 06/22/2023 1422   TCO2 22 06/22/2023 1422   ACIDBASEDEF 5.0 (H) 06/22/2023 1422   O2SAT 100 06/22/2023 1422     Coagulation Profile: Recent Labs  Lab 06/20/23 1130  INR 1.5*    Cardiac Enzymes: No results for input(s): "CKTOTAL", "CKMB", "CKMBINDEX", "TROPONINI" in the last 168 hours.  HbA1C: Hgb A1c MFr Bld  Date/Time Value Ref Range Status  06/20/2023 11:08 AM 5.3 4.8 - 5.6 % Final    Comment:    (NOTE)         Prediabetes: 5.7 -  6.4         Diabetes: >6.4         Glycemic control for adults with diabetes: <7.0   08/04/2016 08:13 AM 5.7 (H) 4.8 - 5.6 % Final    Comment:             Pre-diabetes: 5.7 - 6.4          Diabetes: >6.4          Glycemic control for adults with diabetes: <7.0     CBG: Recent Labs  Lab 06/22/23 1625 06/22/23 1649  GLUCAP 30* 101*    Review of Systems:   Critically ill intubated on mechanical life support  Past Medical History:  He,  has a past medical history of ADHD (attention deficit hyperactivity disorder), Anemia, Anemia, chronic disease (07/13/2011), Anxiety, Arthritis, Bipolar 2 disorder (HCC) (08/18/2011), Bipolar disorder (HCC), Blood dyscrasia, Blood transfusion, Blood transfusion without reported diagnosis, Chronic diastolic CHF (congestive heart failure) (HCC), Complication of anesthesia, Congestive heart failure (CHF) (HCC) (07/13/2011), Depression, Dysrhythmia, Erectile dysfunction, ESRD on hemodialysis (HCC) (07/13/2011), GI bleed (11/01/2011), Heart murmur, Hemodialysis status (HCC), Hydrocele, right (11/25/2014), Hypertension, Hypertensive emergency (01/06/2013), Hypoglycemia (01/11/2013), Influenza-like illness (12/18/2013), Mild ascending aorta dilatation (HCC), NICM (nonischemic cardiomyopathy) (HCC), Pericardial effusion, Peripheral polyneuropathy (08/04/2016), Pneumonia, Pulmonary edema (01/06/2013), Pulmonary hypertension associated with end stage renal disease on dialysis (HCC) (07/13/2011), Renal transplant, status post (11/25/2014), Respiratory failure with hypoxia (HCC) (01/06/2013), Shortness of breath (12/12/2012),  Swollen testicle (07/13/2011), and URI (upper respiratory infection) (01/25/2012).   Surgical History:   Past Surgical History:  Procedure Laterality Date   ANGIOPLASTY     BUBBLE STUDY  09/05/2022   Procedure: BUBBLE STUDY;  Surgeon: Sande Rives, MD;  Location: [facility] ENDOSCOPY;  Service: Cardiovascular;;   CARDIOVERSION N/A 04/13/2023    Procedure: CARDIOVERSION;  Surgeon: Jake Bathe, MD;  Location: [facility] INVASIVE CV LAB;  Service: Cardiovascular;  Laterality: N/A;   CHOLECYSTECTOMY     DIALYSIS FISTULA CREATION     EVALUATION UNDER ANESTHESIA WITH HEMORRHOIDECTOMY N/A 03/15/2020   Procedure: CONTROL OF ANAL BLEEDING;  Surgeon: Andria Meuse, MD;  Location: [facility] OR;  Service: General;  Laterality: N/A;   KIDNEY TRANSPLANT  10/17/2014   KNEE SURGERY Right    arthroscopic   LEFT HEART CATHETERIZATION WITH CORONARY ANGIOGRAM N/A 10/01/2013   Procedure: LEFT HEART CATHETERIZATION WITH CORONARY ANGIOGRAM;  Surgeon: Wendall Stade, MD;  Location: [facility] CATH LAB;  Service: Cardiovascular;  Laterality: N/A;   MOUTH SURGERY     teeth removed   NEPHRECTOMY TRANSPLANTED ORGAN     RIGHT/LEFT HEART CATH AND CORONARY ANGIOGRAPHY N/A 11/24/2022   Procedure: RIGHT/LEFT HEART CATH AND CORONARY ANGIOGRAPHY;  Surgeon: Kathleene Hazel, MD;  Location: [facility] INVASIVE CV LAB;  Service: Cardiovascular;  Laterality: N/A;   TEE WITHOUT CARDIOVERSION N/A 09/05/2022   Procedure: TRANSESOPHAGEAL ECHOCARDIOGRAM (TEE);  Surgeon: Sande Rives, MD;  Location: [facility] ENDOSCOPY;  Service: Cardiovascular;  Laterality: N/A;     Social History:   reports that he has quit smoking. His smoking use included cigars. He has never used smokeless tobacco. He reports current alcohol use of about 1.0 standard drink of alcohol per week. He reports that he does not use drugs.   Family History:  His family history includes Arthritis in his father and mother; Depression in his father and mother; Diabetes in his father and mother; Hypertension in his father and mother.   Allergies Allergies  Allergen Reactions   Delacort [Hydrocortisone Base] Nausea Only   Benicar [Olmesartan]     Hair loss   Diltiazem Other (See Comments)    Tachycardia   Food Itching    bananas   Hydralazine Other (See Comments)    Tachycardia   Nitrates, Organic Other (See  Comments)    Palpitations, headache     Home Medications  Prior to Admission medications   Medication Sig Start Date End Date Taking? Authorizing Provider  acetaminophen (TYLENOL) 500 MG tablet Take 1,000 mg by mouth daily as needed for moderate pain.   Yes [provider]  acyclovir (ZOVIRAX) 200 MG capsule Take 200 mg by mouth See admin instructions. Take 200 mg twice daily on non-dialysis days (Tues, Thurs, Sat, and Sun). Take 200 mg 3 times daily on dialysis days (Mon, Wed, and Fri) 09/08/22  Yes [provider]  amLODipine (NORVASC) 5 MG tablet Take 5 mg by mouth daily. 06/02/23 06/01/24 Yes [provider]  atorvastatin (LIPITOR) 10 MG tablet Take 1 tablet (10 mg total) by mouth daily. Patient taking differently: Take 10 mg by mouth at bedtime. 01/27/23 01/22/24 Yes O'Neal, Ronnald Ramp, MD  bismuth subsalicylate (PEPTO BISMOL) 262 MG chewable tablet Chew 524 mg by mouth as needed for indigestion.   Yes [provider]  calcium carbonate (TUMS EX) 750 MG chewable tablet Chew 1,500 mg by mouth See admin instructions. Take 1500 mg with each snack   Yes [provider]  carvedilol (COREG) 12.5  MG tablet Take 12.5 mg by mouth See admin instructions. Take 12.5 mg on Monday,Wednesday and Friday once a day in the morning 09/19/22  Yes [provider]  carvedilol (COREG) 25 MG tablet Take 1 tablet (25 mg total) by mouth 2 times daily with meals. Patient taking differently: Take 25 mg by mouth See admin instructions. Take 25 mg twice a day on Tuesday,Thursday, Saturday and Sunday 06/08/22  Yes O'Neal, Ronnald Ramp, MD  clobetasol ointment (TEMOVATE) 0.05 % Apply 1 Application topically 2 (two) times daily as needed (rash).   Yes [provider]  clonazePAM (KLONOPIN) 1 MG tablet Take 1 mg by mouth daily as needed for anxiety.   Yes [provider]  fluconazole (DIFLUCAN) 100 MG tablet At first sign of thrush take 2 tablets by mouth on  Day 1, then 1 tablet by mouth Days 2-7. 05/22/23  Yes [provider]  HYDROcodone-acetaminophen (NORCO) 7.5-325 MG tablet Take 1 tablet by mouth 3 (three) times daily as needed for severe pain or moderate pain. 04/07/21  Yes [provider]  lidocaine-prilocaine (EMLA) cream Apply 1 Application topically every Monday, Wednesday, and Friday. 1 hour and 30 minutes prior to Dialysis 05/01/20  Yes [provider]  loperamide (IMODIUM) 1 MG/5ML solution Take 3-6 mg by mouth as needed for diarrhea or loose stools.   Yes [provider]  sevelamer carbonate (RENVELA) 2.4 g PACK Take 4.8 g by mouth 3 (three) times daily with meals.   Yes [provider]     This patient is critically ill with multiple organ system failure; which, requires frequent high complexity decision making, assessment, support, evaluation, and titration of therapies. This was completed through the application of advanced monitoring technologies and extensive interpretation of multiple databases. During this encounter critical care time was devoted to patient care services described in this note for 32 minutes.  Josephine Igo, DO [facility] 06/22/2023 5:15 PM

## 2023-06-22 NOTE — Brief Op Note (Signed)
06/22/2023  3:33 PM  PATIENT:  [name]  52 y.o. male  PRE-OPERATIVE DIAGNOSIS:  Mitral regurigitation, atrial fibrilation, tricuspid regurgitation  POST-OPERATIVE DIAGNOSIS:  Mitral regurigitation, atrial fibrilation, tricuspid regurgitation  PROCEDURE:   TRICUSPID VALVE REPAIR utilizing MC3 Ring size 30mm MITRAL VALVE REPAIR (MVR) utilizing Simulis Band size 30mm AORTIC VALVE REPLACEMENT (AVR) utilizing Inspiris valve size 27mm MAZE  TRANSESOPHAGEAL ECHOCARDIOGRAM   SURGEON:  Surgeons and Role:    Eugenio Hoes, MD - Primary  PHYSICIAN ASSISTANT: Aloha Gell PA-C  ASSISTANTS: Dorthey Sawyer RNFA   ANESTHESIA:   general  EBL:  4850 mL   BLOOD ADMINISTERED: 1000 CC CELLSAVER  DRAINS:  mediastinal drains    LOCAL MEDICATIONS USED:  NONE  SPECIMEN:  No Specimen  DISPOSITION OF SPECIMEN:  N/A  COUNTS:  YES  DICTATION: .Dragon Dictation  PLAN OF CARE: Admit to inpatient   PATIENT DISPOSITION:  ICU - intubated and hemodynamically stable.   Delay start of Pharmacological VTE agent (>24hrs) due to surgical blood loss or risk of bleeding: yes

## 2023-06-22 NOTE — Anesthesia Procedure Notes (Signed)
Arterial Line Insertion Start/End8/15/2024 7:00 AM, 06/22/2023 7:05 AM Performed by: Mariann Barter, MD, attending  Patient location: Pre-op. Preanesthetic checklist: patient identified, IV checked, site marked, risks and benefits discussed, surgical consent, monitors and equipment checked, pre-op evaluation, timeout performed and anesthesia consent Lidocaine 1% used for infiltration and patient sedated Left, brachial was placed Catheter size: 18 G Hand hygiene performed  and maximum sterile barriers used   Attempts: 1 Procedure performed using ultrasound guided technique. Ultrasound Notes:anatomy identified, needle tip was noted to be adjacent to the nerve/plexus identified and no ultrasound evidence of intravascular and/or intraneural injection Following insertion, dressing applied and Biopatch. Post procedure assessment: normal  Patient tolerated the procedure well with no immediate complications. Additional procedure comments: Right arm with active AVF precluding arterial line placement. Left arm with previously ligated fistula; brachial artery appeared patent with easy u/s guided needle placement and subsequent wire passage. Marland Kitchen

## 2023-06-22 NOTE — Interval H&P Note (Signed)
History and Physical Interval Note:  06/22/2023 6:30 AM  [name]  has presented today for surgery, with the diagnosis of SEVERE MR AFIB severe TR Aortic Stenosis.  The various methods of treatment have been discussed with the patient and family. After consideration of risks, benefits and other options for treatment, the patient has consented to  Procedure(s): TRICUSPID VALVE REPAIR (N/A) MITRAL VALVE REPAIR (MVR) (N/A) possible AORTIC VALVE REPLACEMENT (AVR) (N/A) MAZE (N/A) TRANSESOPHAGEAL ECHOCARDIOGRAM (N/A) as a surgical intervention.  The patient's history has been reviewed, patient examined, no change in status, stable for surgery.  I have reviewed the patient's chart and labs.  Questions were answered to the patient's satisfaction.     Eugenio Hoes

## 2023-06-23 ENCOUNTER — Inpatient Hospital Stay (HOSPITAL_COMMUNITY): Payer: Medicare HMO

## 2023-06-23 DIAGNOSIS — I442 Atrioventricular block, complete: Secondary | ICD-10-CM

## 2023-06-23 DIAGNOSIS — N289 Disorder of kidney and ureter, unspecified: Secondary | ICD-10-CM | POA: Diagnosis not present

## 2023-06-23 DIAGNOSIS — Z8679 Personal history of other diseases of the circulatory system: Secondary | ICD-10-CM

## 2023-06-23 DIAGNOSIS — Z9889 Other specified postprocedural states: Secondary | ICD-10-CM

## 2023-06-23 DIAGNOSIS — I071 Rheumatic tricuspid insufficiency: Secondary | ICD-10-CM

## 2023-06-23 DIAGNOSIS — Z952 Presence of prosthetic heart valve: Secondary | ICD-10-CM

## 2023-06-23 LAB — RENAL FUNCTION PANEL
Albumin: 2.5 g/dL — ABNORMAL LOW (ref 3.5–5.0)
Albumin: 2.6 g/dL — ABNORMAL LOW (ref 3.5–5.0)
Anion gap: 12 (ref 5–15)
Anion gap: 12 (ref 5–15)
BUN: 52 mg/dL — ABNORMAL HIGH (ref 6–20)
BUN: 50 mg/dL — ABNORMAL HIGH (ref 6–20)
CO2: 20 mmol/L — ABNORMAL LOW (ref 22–32)
CO2: 19 mmol/L — ABNORMAL LOW (ref 22–32)
Calcium: 8 mg/dL — ABNORMAL LOW (ref 8.9–10.3)
Calcium: 7.9 mg/dL — ABNORMAL LOW (ref 8.9–10.3)
Chloride: 107 mmol/L (ref 98–111)
Chloride: 106 mmol/L (ref 98–111)
Creatinine, Ser: 9.23 mg/dL — ABNORMAL HIGH (ref 0.61–1.24)
Creatinine, Ser: 8.01 mg/dL — ABNORMAL HIGH (ref 0.61–1.24)
GFR, Estimated: 6 mL/min — ABNORMAL LOW (ref >60)
GFR, Estimated: 8 mL/min — ABNORMAL LOW (ref >60)
Glucose, Bld: 90 mg/dL (ref 70–99)
Glucose, Bld: 103 mg/dL — ABNORMAL HIGH (ref 70–99)
Phosphorus: 6.1 mg/dL — ABNORMAL HIGH (ref 2.5–4.6)
Phosphorus: 5.8 mg/dL — ABNORMAL HIGH (ref 2.5–4.6)
Potassium: 5.5 mmol/L — ABNORMAL HIGH (ref 3.5–5.1)
Potassium: 5.1 mmol/L (ref 3.5–5.1)
Sodium: 139 mmol/L (ref 135–145)
Sodium: 137 mmol/L (ref 135–145)

## 2023-06-23 LAB — CBC
HCT: 25.9 % — ABNORMAL LOW (ref 39.0–52.0)
HCT: 24.7 % — ABNORMAL LOW (ref 39.0–52.0)
Hemoglobin: 8.2 g/dL — ABNORMAL LOW (ref 13.0–17.0)
Hemoglobin: 7.9 g/dL — ABNORMAL LOW (ref 13.0–17.0)
MCH: 32.3 pg (ref 26.0–34.0)
MCH: 32.5 pg (ref 26.0–34.0)
MCHC: 31.7 g/dL (ref 30.0–36.0)
MCHC: 32 g/dL (ref 30.0–36.0)
MCV: 102 fL — ABNORMAL HIGH (ref 80.0–100.0)
MCV: 101.6 fL — ABNORMAL HIGH (ref 80.0–100.0)
Platelets: 102 10*3/uL — ABNORMAL LOW (ref 150–400)
Platelets: 104 10*3/uL — ABNORMAL LOW (ref 150–400)
RBC: 2.54 MIL/uL — ABNORMAL LOW (ref 4.22–5.81)
RBC: 2.43 MIL/uL — ABNORMAL LOW (ref 4.22–5.81)
RDW: 14.8 % (ref 11.5–15.5)
RDW: 14.9 % (ref 11.5–15.5)
WBC: 10.5 10*3/uL (ref 4.0–10.5)
WBC: 11.8 10*3/uL — ABNORMAL HIGH (ref 4.0–10.5)
nRBC: 0.2 % (ref 0.0–0.2)
nRBC: 0.2 % (ref 0.0–0.2)

## 2023-06-23 LAB — MAGNESIUM
Magnesium: 1.9 mg/dL (ref 1.7–2.4)
Magnesium: 2.1 mg/dL (ref 1.7–2.4)

## 2023-06-23 LAB — BASIC METABOLIC PANEL
Anion gap: 15 (ref 5–15)
BUN: 48 mg/dL — ABNORMAL HIGH (ref 6–20)
CO2: 19 mmol/L — ABNORMAL LOW (ref 22–32)
Calcium: 7.7 mg/dL — ABNORMAL LOW (ref 8.9–10.3)
Chloride: 102 mmol/L (ref 98–111)
Creatinine, Ser: 8.16 mg/dL — ABNORMAL HIGH (ref 0.61–1.24)
GFR, Estimated: 7 mL/min — ABNORMAL LOW (ref >60)
Glucose, Bld: 103 mg/dL — ABNORMAL HIGH (ref 70–99)
Potassium: 5.1 mmol/L (ref 3.5–5.1)
Sodium: 136 mmol/L (ref 135–145)

## 2023-06-23 LAB — BPAM CRYOPRECIPITATE
Blood Product Expiration Date: 202408192359
ISSUE DATE / TIME: 202408151002
Unit Type and Rh: 6200

## 2023-06-23 LAB — POCT I-STAT 7, (LYTES, BLD GAS, ICA,H+H)
Acid-base deficit: 7 mmol/L — ABNORMAL HIGH (ref 0.0–2.0)
Acid-base deficit: 6 mmol/L — ABNORMAL HIGH (ref 0.0–2.0)
Bicarbonate: 19.4 mmol/L — ABNORMAL LOW (ref 20.0–28.0)
Bicarbonate: 19.7 mmol/L — ABNORMAL LOW (ref 20.0–28.0)
Calcium, Ion: 1.13 mmol/L — ABNORMAL LOW (ref 1.15–1.40)
Calcium, Ion: 1.11 mmol/L — ABNORMAL LOW (ref 1.15–1.40)
HCT: 27 % — ABNORMAL LOW (ref 39.0–52.0)
HCT: 30 % — ABNORMAL LOW (ref 39.0–52.0)
Hemoglobin: 9.2 g/dL — ABNORMAL LOW (ref 13.0–17.0)
Hemoglobin: 10.2 g/dL — ABNORMAL LOW (ref 13.0–17.0)
O2 Saturation: 98 %
O2 Saturation: 99 %
Patient temperature: 37.5
Potassium: 5.6 mmol/L — ABNORMAL HIGH (ref 3.5–5.1)
Potassium: 5.8 mmol/L — ABNORMAL HIGH (ref 3.5–5.1)
Sodium: 141 mmol/L (ref 135–145)
Sodium: 139 mmol/L (ref 135–145)
TCO2: 21 mmol/L — ABNORMAL LOW (ref 22–32)
TCO2: 21 mmol/L — ABNORMAL LOW (ref 22–32)
pCO2 arterial: 45.2 mmHg (ref 32–48)
pCO2 arterial: 40 mmHg (ref 32–48)
pH, Arterial: 7.244 — ABNORMAL LOW (ref 7.35–7.45)
pH, Arterial: 7.3 — ABNORMAL LOW (ref 7.35–7.45)
pO2, Arterial: 118 mmHg — ABNORMAL HIGH (ref 83–108)
pO2, Arterial: 133 mmHg — ABNORMAL HIGH (ref 83–108)

## 2023-06-23 LAB — PREPARE CRYOPRECIPITATE: Unit division: 0

## 2023-06-23 LAB — BPAM PLATELET PHERESIS
Blood Product Expiration Date: 202408152359
ISSUE DATE / TIME: 202408151003
Unit Type and Rh: 7300

## 2023-06-23 LAB — PREPARE PLATELET PHERESIS

## 2023-06-23 LAB — GLUCOSE, CAPILLARY
Glucose-Capillary: 97 mg/dL (ref 70–99)
Glucose-Capillary: 102 mg/dL — ABNORMAL HIGH (ref 70–99)
Glucose-Capillary: 107 mg/dL — ABNORMAL HIGH (ref 70–99)
Glucose-Capillary: 133 mg/dL — ABNORMAL HIGH (ref 70–99)
Glucose-Capillary: 124 mg/dL — ABNORMAL HIGH (ref 70–99)

## 2023-06-23 MED ORDER — HEPARIN SODIUM (PORCINE) 1000 UNIT/ML DIALYSIS
1000.0000 [IU] | INTRAMUSCULAR | Status: DC | PRN
  Administered 2023-07-02: 2800 [IU] via INTRAVENOUS_CENTRAL
  Filled 2023-06-23: qty 4
  Filled 2023-06-23: qty 3

## 2023-06-23 MED ORDER — ACYCLOVIR 200 MG/5ML PO SUSP
200.0000 mg | Freq: Two times a day (BID) | ORAL | Status: DC
  Administered 2023-06-23 – 2023-06-25 (×3): 200 mg
  Filled 2023-06-23 (×5): qty 5

## 2023-06-23 MED ORDER — CEFAZOLIN SODIUM-DEXTROSE 2-4 GM/100ML-% IV SOLN
2.0000 g | Freq: Two times a day (BID) | INTRAVENOUS | Status: AC
  Administered 2023-06-23 – 2023-06-24 (×3): 2 g via INTRAVENOUS
  Filled 2023-06-23 (×3): qty 100

## 2023-06-23 MED ORDER — NOREPINEPHRINE 16 MG/250ML-% IV SOLN
0.0000 ug/min | INTRAVENOUS | Status: DC
  Administered 2023-06-23: 2 ug/min via INTRAVENOUS
  Administered 2023-06-23: 12 ug/min via INTRAVENOUS
  Administered 2023-06-24: 15 ug/min via INTRAVENOUS
  Administered 2023-06-25: 10 ug/min via INTRAVENOUS
  Filled 2023-06-23 (×4): qty 250

## 2023-06-23 MED ORDER — SODIUM CHLORIDE 0.9 % FOR CRRT: INTRAVENOUS_CENTRAL | Status: DC | PRN

## 2023-06-23 MED ORDER — PRISMASOL BGK 0/2.5 32-2.5 MEQ/L EC SOLN
Status: DC
  Filled 2023-06-23 (×3): qty 5000

## 2023-06-23 MED ORDER — SODIUM BICARBONATE 8.4 % IV SOLN
50.0000 meq | Freq: Once | INTRAVENOUS | Status: AC
  Administered 2023-06-23: 50 meq via INTRAVENOUS

## 2023-06-23 MED ORDER — PRISMASOL BGK 0/2.5 32-2.5 MEQ/L EC SOLN
Status: DC
  Filled 2023-06-23 (×11): qty 5000

## 2023-06-23 MED ORDER — PRISMASOL BGK 0/2.5 32-2.5 MEQ/L EC SOLN
Status: DC
  Filled 2023-06-23 (×4): qty 5000

## 2023-06-23 MED FILL — Lidocaine HCl Local Preservative Free (PF) Inj 2%: INTRAMUSCULAR | Qty: 14 | Status: AC

## 2023-06-23 MED FILL — Potassium Chloride Inj 2 mEq/ML: INTRAVENOUS | Qty: 40 | Status: AC

## 2023-06-23 MED FILL — Heparin Sodium (Porcine) Inj 1000 Unit/ML: Qty: 1000 | Status: AC

## 2023-06-23 NOTE — Progress Notes (Signed)
Pt achieved a NIF of -30 and VC  1.2 with great pt effort on all attempts. Pt tolerated well, RN at bedside, RT will monitor as need.

## 2023-06-23 NOTE — Progress Notes (Signed)
[address]       [place] [address]             [phone]      1 Day Post-Op  Procedure(s) (LRB): TRICUSPID VALVE REPAIR MC3 Ring size 30 (N/A) MITRAL VALVE REPAIR (MVR) utilizing Simulis Band size 30mm (N/A) AORTIC VALVE REPLACEMENT (AVR) inspiris valve size 27 (N/A) MAZE (N/A) TRANSESOPHAGEAL ECHOCARDIOGRAM (N/A)   Total Length of Stay:  LOS: 1 day    SUBJECTIVE: Was kept intubated overnight Stable    Vitals:   06/23/23 0615 06/23/23 0630  BP:    Pulse: 89 89  Resp: 16 16  Temp: 98.6 F (37 C) 98.4 F (36.9 C)  SpO2: 100% 100%    Intake/Output      08/15 0701 08/16 0700 08/16 0701 08/17 0700   I.V. (mL/kg) 3892.1 (41.6)    Blood 1932    NG/GT 130    IV Piggyback 1700.2    Total Intake(mL/kg) 7654.4 (81.9)    Urine (mL/kg/hr) 15 (0)    Emesis/NG output 400    Blood 4850    Chest Tube 1030    Total Output 6295    Net +1359.4             sodium chloride Stopped (06/23/23 0155)   sodium chloride     sodium chloride 10 mL/hr at 06/23/23 0600   albumin human Stopped (06/23/23 0040)    ceFAZolin (ANCEF) IV Stopped (06/23/23 0135)   dexmedetomidine (PRECEDEX) IV infusion 0.4 mcg/kg/hr (06/23/23 0600)   epinephrine 6 mcg/min (06/23/23 0600)   lactated ringers     lactated ringers     magnesium sulfate     nitroGLYCERIN     norepinephrine (LEVOPHED) Adult infusion 12 mcg/min (06/23/23 0600)   phenylephrine (NEO-SYNEPHRINE) Adult infusion 0 mcg/min (06/22/23 1537)   vasopressin 0.04 Units/min (06/23/23 0600)    CBC    Component Value Date/Time   WBC 10.5 06/23/2023 0402   RBC 2.54 (L) 06/23/2023 0402   HGB 8.2 (L) 06/23/2023 0402   HCT 25.9 (L) 06/23/2023 0402   PLT 102 (L) 06/23/2023 0402   MCV 102.0 (H) 06/23/2023 0402   MCH 32.3 06/23/2023 0402   MCHC 31.7 06/23/2023 0402   RDW 14.8 06/23/2023 0402   LYMPHSABS 2.2 03/15/2020 0947   MONOABS 0.5 03/15/2020 0947   EOSABS 0.2 03/15/2020 0947   BASOSABS 0.0  03/15/2020 0947   CMP     Component Value Date/Time   NA 139 06/23/2023 0402   K 5.5 (H) 06/23/2023 0402   CL 107 06/23/2023 0402   CO2 20 (L) 06/23/2023 0402   GLUCOSE 90 06/23/2023 0402   BUN 52 (H) 06/23/2023 0402   CREATININE 9.23 (H) 06/23/2023 0402   CALCIUM 8.0 (L) 06/23/2023 0402   CALCIUM 9.8 02/18/2011 1007   PROT 9.0 (H) 06/20/2023 1130   ALBUMIN 2.5 (L) 06/23/2023 0402   AST 48 (H) 06/20/2023 1130   ALT 38 06/20/2023 1130   ALKPHOS 94 06/20/2023 1130   BILITOT 0.7 06/20/2023 1130   GFRNONAA 6 (L) 06/23/2023 0402   GFRAA 10 (L) 03/16/2020 0825   ABG    Component Value Date/Time   PHART 7.244 (L) 06/23/2023 0301   PCO2ART 45.2 06/23/2023 0301   PO2ART 118 (H) 06/23/2023 0301   HCO3 19.4 (L) 06/23/2023 0301   TCO2 21 (L) 06/23/2023 0301   ACIDBASEDEF 7.0 (H) 06/23/2023 0301   O2SAT 98 06/23/2023 0301   CBG (last  3)  Recent Labs    06/22/23 2043 06/22/23 2303 06/23/23 0404  GLUCAP 101* 103* 97  EXAM Lungs: decreased at bases Card: rr with CHB underneath Ext: warm Neuro: sedated   ASSESSMENT: POD #1 SP AVR, MV repair, TV repair, MAZE Hemodynamics ok on vaso, epi, levo. Will wean today Pulmonary: work towards extubation. Cxr with bilateral effusions. Leave in chest tubes Renal: K stable but in the 5s. Will need dialysis today Heart block: follow for now. Consulting EP    Eugenio Hoes, MD 06/23/2023

## 2023-06-23 NOTE — Procedures (Signed)
I saw and evaluated the patient on CRRT.  I reviewed the events thus far.  Adjustments to CRRT prescription are made as needed.  2K. Running well so far. No issues.   Filed Weights   06/22/23 0559 06/23/23 0500  Weight: 95 kg 93.5 kg    Recent Labs  Lab 06/23/23 0402 06/23/23 0841  NA 139 139  K 5.5* 5.8*  CL 107  --   CO2 20*  --   GLUCOSE 90  --   [name] 52*  --   CREATININE 9.23*  --   CALCIUM 8.0*  --   PHOS 6.1*  --     Recent Labs  Lab 06/22/23 1618 06/22/23 1620 06/22/23 2147 06/23/23 0301 06/23/23 0402 06/23/23 0841  WBC 8.5  --  8.5  --  10.5  --   HGB 9.2*   < > 8.6* 9.2* 8.2* 10.2*  HCT 29.1*   < > 27.1* 27.0* 25.9* 30.0*  MCV 103.2*  --  103.0*  --  102.0*  --   PLT 88*  --  82*  --  102*  --    < > = values in this interval not displayed.    Scheduled Meds:  acetaminophen  1,000 mg Oral Q6H   Or   acetaminophen (TYLENOL) oral liquid 160 mg/5 mL  1,000 mg Per Tube Q6H   acyclovir  200 mg Per Tube BID   atorvastatin  10 mg Per Tube QHS   bisacodyl  10 mg Oral Daily   Or   bisacodyl  10 mg Rectal Daily   Chlorhexidine Gluconate Cloth  6 each Topical Daily   docusate sodium  200 mg Oral Daily   insulin aspart  0-24 Units Subcutaneous Q4H   lidocaine-prilocaine  1 Application Topical Q M,W,F   metoCLOPramide (REGLAN) injection  10 mg Intravenous Q6H   mouth rinse  15 mL Mouth Rinse Q2H   pantoprazole (PROTONIX) IV  40 mg Intravenous Q12H   sodium chloride flush  3 mL Intravenous Q12H   Continuous Infusions:  sodium chloride Stopped (06/23/23 1106)   sodium chloride     sodium chloride 10 mL/hr at 06/23/23 1400   albumin human Stopped (06/23/23 0040)    ceFAZolin (ANCEF) IV     dexmedetomidine (PRECEDEX) IV infusion 0.4 mcg/kg/hr (06/23/23 1400)   epinephrine 6 mcg/min (06/23/23 1400)   lactated ringers     lactated ringers     nitroGLYCERIN     norepinephrine (LEVOPHED) Adult infusion 11 mcg/min (06/23/23 1400)   phenylephrine  (NEO-SYNEPHRINE) Adult infusion 0 mcg/min (06/22/23 1537)   prismasol BGK 2/2.5 dialysis solution 1,500 mL/hr at 06/23/23 1212   prismasol BGK 2/2.5 replacement solution 500 mL/hr at 06/23/23 1213   prismasol BGK 2/2.5 replacement solution 300 mL/hr at 06/23/23 1213   vasopressin 0.04 Units/min (06/23/23 1400)   PRN Meds:.sodium chloride, albumin human, clonazePAM, heparin, metoprolol tartrate, midazolam, morphine injection, ondansetron (ZOFRAN) IV, mouth rinse, oxyCODONE, sodium chloride, sodium chloride flush, traMADol   Mark Bun,  MD 06/23/2023, 2:59 PM

## 2023-06-23 NOTE — Plan of Care (Signed)
  Problem: Education: Goal: Knowledge of General Education information will improve Description: Including pain rating scale, medication(s)/side effects and non-pharmacologic comfort measures 06/23/2023 0414 by Mick Sell, RN Outcome: Progressing 06/23/2023 0413 by Mick Sell, RN Outcome: Progressing   Problem: Health Behavior/Discharge Planning: Goal: Ability to manage health-related needs will improve 06/23/2023 0414 by Mick Sell, RN Outcome: Progressing 06/23/2023 0413 by Mick Sell, RN Outcome: Progressing   Problem: Clinical Measurements: Goal: Will remain free from infection Outcome: Progressing Goal: Diagnostic test results will improve 06/23/2023 0414 by Mick Sell, RN Outcome: Progressing 06/23/2023 0413 by Mick Sell, RN Outcome: Progressing Goal: Respiratory complications will improve 06/23/2023 0414 by Mick Sell, RN Outcome: Progressing 06/23/2023 0413 by Mick Sell, RN Outcome: Progressing Goal: Cardiovascular complication will be avoided 06/23/2023 0414 by Mick Sell, RN Outcome: Progressing 06/23/2023 0413 by Mick Sell, RN Outcome: Progressing   Problem: Coping: Goal: Level of anxiety will decrease Outcome: Progressing   Problem: Pain Managment: Goal: General experience of comfort will improve 06/23/2023 0414 by Mick Sell, RN Outcome: Progressing 06/23/2023 0413 by Mick Sell, RN Outcome: Progressing   Problem: Safety: Goal: Ability to remain free from injury will improve Outcome: Progressing   Problem: Cardiac: Goal: Will achieve and/or maintain hemodynamic stability Outcome: Progressing   Problem: Clinical Measurements: Goal: Postoperative complications will be avoided or minimized Outcome: Progressing   Problem: Respiratory: Goal: Respiratory status will improve Outcome: Progressing   Problem: Safety: Goal: Non-violent Restraint(s) Outcome: Progressing

## 2023-06-23 NOTE — Progress Notes (Signed)
After ABG  NIF -30 VC 1L positive cuff leak

## 2023-06-23 NOTE — Consult Note (Signed)
ESRD Consult Note  Requesting provider: Eugenio Hoes Service requesting consult: CT surgery Reason for consult: ESRD, provision of dialysis Indication for acute dialysis?: End Stage Renal Disease  Outpatient dialysis unit: [facility] Outpatient dialysis prescription: 3hr 45 min, F250, EDW 94kg, 2k, 2.5 Ca  Assessment/Recommendations: [name] is a/an 52 y.o. male with a past medical history notable for ESRD on HD admitted with MV, AV, and TV issues s/p repair/replacement.   # ESRD: Too hypotensive for hemodialysis today.  Plan to start CRRT.  Given elevated potassium we will plan on 2K bath for now.  # Volume/ hypertension: Weights unreliable at this time.  Adjust ultrafiltration as needed on CRRT.  No need for blood pressure medications  # Anemia of Chronic Kidney Disease: Hemoglobin acceptable at 10.2 today.  Likely some blood loss with surgery.  Transfusions as needed.  Consider ESA  # Secondary Hyperparathyroidism/Hyperphosphatemia: Phosphorus 6.1 yesterday.  Continue to monitor.  Calcium 8 but corrects near normal when accounting for albumin.  # Vascular access: Right upper extremity AVF functioning well.  Temporary dialysis catheter being placed.  Appreciate help.  # Hyperkalemia: Plan for CRRT as above  # Shock: Postoperative.  Continue pressors per primary team  # Postoperative respiratory failure: On ventilator.  Per primary team  # Tricuspid, aortic, mitral valve insufficiencies: Status post Tri valve repair on 8/15.  Per cardiothoracic surgery  # Additional recommendations: - Dose all meds for creatinine clearance < 10 ml/min  - Unless absolutely necessary, no MRIs with gadolinium.  - Implement save arm precautions.  Prefer needle sticks in the dorsum of the hands or wrists.  No blood pressure measurements in arm. - If blood transfusion is requested during hemodialysis sessions, please alert [place] prior to the session.  - Use synthetic opioids (Fentanyl/Dilaudid) if  needed  Recommendations were discussed with the primary team.   History of Present Illness: [name] is a/an 52 y.o. male with a past medical history of ESRD who presents for scheduled cardiac surgery.  We are consulted given ESRD status.  Patient was intubated and sedated so history was obtained per chart review.  Patient was evaluated because of severe mitral regurgitation and tricuspid regurgitation in the outpatient setting.  He appeared to be symptomatic with dyspnea on exertion as well as fatigue.  He was admitted and underwent surgery on 8/15 with complex mitral valve repair, tricuspid valve annular plasty, aortic valve replacement, full right and left maze with occlusion of the left atrial appendage.  Since the surgery the patient has remained intubated.  He is on multiple pressors.  Somewhat interactive today but team is increasing his sedation.  He has experienced some postop shock and vasoplegia as well as postoperative respiratory failure which is not all definitively unexpected.  Patient [facility].  Typically 3 hours 45 minutes, EDW 94 kg, F250, no heparin, 2K, 2.5 calcium.  History of 2 failed transplants and has been on dialysis for about 3 years since most recent transplant failed.  No reported issues regarding dialysis.  His fistula is functional.   Medications:  Current Facility-Administered Medications  Medication Dose Route Frequency Provider Last Rate Last Admin   0.45 % sodium chloride infusion   Intravenous Continuous PRN Jenny Reichmann, PA-C   Stopped at 06/23/23 0155   0.9 %  sodium chloride infusion  250 mL Intravenous Continuous Stehler, Bailey C, PA-C       0.9 %  sodium chloride infusion   Intravenous Continuous  Jenny Reichmann, PA-C 10 mL/hr at 06/23/23 0900 Infusion Verify at 06/23/23 0900   acetaminophen (TYLENOL) tablet 1,000 mg  1,000 mg Oral Q6H Stehler, Oren Bracket, PA-C       Or   acetaminophen (TYLENOL) 160  MG/5ML solution 1,000 mg  1,000 mg Per Tube Q6H Stehler, Bailey C, PA-C   1,000 mg at 06/23/23 0522   acyclovir (ZOVIRAX) 200 MG/5ML suspension SUSP 200 mg  200 mg Per Tube BID Silvana Newness, RPH       albumin human 5 % solution 12.5 g  250 mL Intravenous Q15 min PRN Jenny Reichmann, PA-C   Stopped at 06/23/23 0040   amiodarone (PACERONE) tablet 400 mg  400 mg Oral BID Jenny Reichmann, PA-C   400 mg at 06/23/23 8469   aspirin chewable tablet 324 mg  324 mg Oral Once Jenny Reichmann, PA-C       aspirin EC tablet 325 mg  325 mg Oral Daily Stehler, Oren Bracket, PA-C       Or   aspirin chewable tablet 324 mg  324 mg Per Tube Daily Jenny Reichmann, PA-C   324 mg at 06/23/23 0948   atorvastatin (LIPITOR) tablet 10 mg  10 mg Per Tube QHS Calton Dach I, RPH       bisacodyl (DULCOLAX) EC tablet 10 mg  10 mg Oral Daily Jenny Reichmann, PA-C   10 mg at 06/23/23 6295   Or   bisacodyl (DULCOLAX) suppository 10 mg  10 mg Rectal Daily Stehler, Oren Bracket, PA-C       ceFAZolin (ANCEF) IVPB 2g/100 mL premix  2 g Intravenous Q12H Silvana Newness, RPH       Chlorhexidine Gluconate Cloth 2 % PADS 6 each  6 each Topical Daily Eugenio Hoes, MD   6 each at 06/23/23 1018   clonazePAM (KLONOPIN) tablet 1 mg  1 mg Oral Daily PRN Jenny Reichmann, PA-C       dexmedetomidine (PRECEDEX) 400 MCG/100ML (4 mcg/mL) infusion  0-0.7 mcg/kg/hr Intravenous Continuous Jenny Reichmann, PA-C 9.5 mL/hr at 06/23/23 0900 0.4 mcg/kg/hr at 06/23/23 0900   docusate sodium (COLACE) capsule 200 mg  200 mg Oral Daily Stehler, Fredric Mare C, PA-C       EPINEPHrine (ADRENALIN) 5 mg in NS 250 mL (0.02 mg/mL) premix infusion  0.5-20 mcg/min Intravenous Titrated Eugenio Hoes, MD 18 mL/hr at 06/23/23 0900 6 mcg/min at 06/23/23 0900   heparin injection 1,000-6,000 Units  1,000-6,000 Units CRRT PRN Darnell Level, MD       insulin aspart (novoLOG) injection 0-24 Units  0-24 Units Subcutaneous Q4H Eugenio Hoes, MD       lactated  ringers infusion   Intravenous Continuous Stehler, Oren Bracket, PA-C       lactated ringers infusion   Intravenous Continuous Stehler, Oren Bracket, PA-C       lidocaine-prilocaine (EMLA) cream 1 Application  1 Application Topical Q M,W,F Stehler, Oren Bracket, PA-C       metoCLOPramide (REGLAN) injection 10 mg  10 mg Intravenous Q6H Stehler, Bailey C, PA-C   10 mg at 06/23/23 0522   metoprolol tartrate (LOPRESSOR) tablet 12.5 mg  12.5 mg Oral BID Jenny Reichmann, PA-C       Or   metoprolol tartrate (LOPRESSOR) 25 mg/10 mL oral suspension 12.5 mg  12.5 mg Per Tube BID Stehler, Bailey C, PA-C       metoprolol tartrate (LOPRESSOR) injection 2.5-5 mg  2.5-5 mg Intravenous  Q2H PRN Aloha Gell C, PA-C       midazolam (VERSED) injection 2 mg  2 mg Intravenous Q1H PRN Aloha Gell C, PA-C   2 mg at 06/23/23 0957   morphine (PF) 2 MG/ML injection 1-4 mg  1-4 mg Intravenous Q1H PRN Aloha Gell C, PA-C   2 mg at 06/23/23 1610   nitroGLYCERIN 50 mg in dextrose 5 % 250 mL (0.2 mg/mL) infusion  0-100 mcg/min Intravenous Titrated Stehler, Bailey C, PA-C       norepinephrine (LEVOPHED) 16 mg in (0.064 mg/mL) premix infusion  0-40 mcg/min Intravenous Titrated Eugenio Hoes, MD 10.31 mL/hr at 06/23/23 0900 11 mcg/min at 06/23/23 0900   ondansetron (ZOFRAN) injection 4 mg  4 mg Intravenous Q6H PRN Jenny Reichmann, PA-C       Oral care mouth rinse  15 mL Mouth Rinse [place] Shi, MD   15 mL at 06/23/23 0815   Oral care mouth rinse  15 mL Mouth Rinse PRN Eugenio Hoes, MD       oxyCODONE (Oxy IR/ROXICODONE) immediate release tablet 5-10 mg  5-10 mg Oral Q3H PRN Jenny Reichmann, PA-C       pantoprazole (PROTONIX) injection 40 mg  40 mg Intravenous Q12H Icard, Bradley L, DO   40 mg at 06/23/23 0948   phenylephrine (NEO-SYNEPHRINE) 20mg /NS premix infusion  0-400 mcg/min Intravenous Titrated Aloha Gell C, PA-C 0 mL/hr at 06/22/23 1537 0 mcg/min at 06/22/23 1537   prismasol BGK 2/2.5 dialysis  solution   CRRT Continuous Darnell Level, MD       prismasol BGK 2/2.5 replacement solution   CRRT Continuous Darnell Level, MD       prismasol BGK 2/2.5 replacement solution   CRRT Continuous Darnell Level, MD       sodium chloride 0.9 % primer fluid for CRRT   CRRT PRN Darnell Level, MD       sodium chloride flush (NS) 0.9 % injection 3 mL  3 mL Intravenous Q12H Stehler, Bailey C, PA-C   3 mL at 06/23/23 0949   sodium chloride flush (NS) 0.9 % injection 3 mL  3 mL Intravenous PRN Jenny Reichmann, PA-C       traMADol Janean Sark) tablet 50-100 mg  50-100 mg Oral Q4H PRN Stehler, Oren Bracket, PA-C       vasopressin (PITRESSIN) 20 Units in 100 mL (0.2 unit/mL) infusion-*FOR SHOCK*  0-0.04 Units/min Intravenous Continuous Calton Dach I, RPH 12 mL/hr at 06/23/23 0900 0.04 Units/min at 06/23/23 0900     ALLERGIES Delacort [hydrocortisone base]; Benicar [olmesartan]; Diltiazem; Food; Hydralazine; and Nitrates, organic  MEDICAL HISTORY Past Medical History:  Diagnosis Date   ADHD (attention deficit hyperactivity disorder)    Anemia    Anemia, chronic disease 07/13/2011   Anxiety    Arthritis    Bipolar 2 disorder (HCC) 08/18/2011   Diagnosed in 2011.    Bipolar disorder (HCC)    Blood dyscrasia    Nephrotic Anemia   Blood transfusion    Blood transfusion without reported diagnosis    Chronic diastolic CHF (congestive heart failure) (HCC)    Complication of anesthesia    Woke up intubated gets combative  afraid to be alone    Congestive heart failure (CHF) (HCC) 07/13/2011   Onset 2006.  Followed by Dr. Eden Emms.  S/p cardiac catheterization in [facility] Dr. Anne Fu..  S/p cardiac catheterization in 2014 by Nishan.  Echo 2014.    Depression  Dysrhythmia    A. Fib 07/20/23 with DCCV in May 2024   Erectile dysfunction    ESRD on hemodialysis (HCC) 07/13/2011   Glomerulonephritis at age 66, started HD in Jul 20, 1995.  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  L forearm AVF . s/p repeat renal transplant 10/2014 [facility].   GI bleed 11/01/2011   Rectal bleeding with emesis/diarrhea    Heart murmur    Hemodialysis status (HCC)    Hydrocele, right 11/25/2014   Hypertension    Hypertensive emergency 01/06/2013   Hypoglycemia 01/11/2013   Influenza-like illness 12/18/2013   Mild ascending aorta dilatation (HCC)    NICM (nonischemic cardiomyopathy) (HCC)    Pericardial effusion    a. Mod by echo in 2013/07/19, similar to prior.   Peripheral polyneuropathy 08/04/2016   Pneumonia    Pulmonary edema 01/06/2013   Pulmonary hypertension associated with end stage renal disease on dialysis [facility]) 07/13/2011   Renal transplant, status post 11/25/2014   Pt with Glomerulonephritis at age 64. Deceased donor renal transplant 07-19-1998 right and left renal transplant 10/2014.  Followed by [facility] transplant team; nephrologist is Dr. Lonna Cobb.    Respiratory failure with hypoxia (HCC) 01/06/2013   Shortness of breath 12/12/2012   Swollen testicle 07/13/2011   URI (upper respiratory infection) 01/25/2012     SOCIAL HISTORY Social History   Socioeconomic History   Marital status: Single    Spouse name: Not on file   Number of children: 0   Years of education: 20+   Highest education level: Not on file  Occupational History   Occupation: Chef- fulltime  Tobacco Use   Smoking status: Former    Types: Cigars   Smokeless tobacco: Never  Vaping Use   Vaping status: Never Used  Substance and Sexual Activity   Alcohol use: Yes    Alcohol/week: 1.0 standard drink of alcohol    Types: 1 Standard drinks or equivalent per week    Comment: Stopped drinking after Kidney transplant in December 2015. Maybe 4 drinks per year.   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      Tobacco: none      Alcohol: none      Drugs: none      Exercise: cycling; 3 days per week.      Sexual activity: females only; no male encounters; Chlamydia age 77.  Total partner = 55.  Condoms.      Caffeine use: Soda sometimes   Social Determinants of Corporate investment banker Strain: Not on file  Food Insecurity: Low Risk  (02/20/2023)   [facility]   Food vital sign    Within the past 12 months, you worried that your food would run out before you got money to buy more: Never true    Within the past 12 months, the food you bought just didn't last and you didn't have money to get more. : Never true  Transportation Needs: No Transportation Needs (02/20/2023)   [facility]   Transportation    In the past 12 months, has lack of  reliable transportation kept you from medical appointments, meetings, work or from getting things needed for daily living? : No  Physical Activity: Not on file  Stress: Not on file  Social Connections: Unknown (03/18/2022)   [facility]   Social Network    Social Network: Not on file  Intimate Partner Violence: Unknown (02/07/2022)   [facility], [facility]   HITS    Physically Hurt: Not on file    Insult or Talk Down To: Not on file    Threaten Physical Harm: Not on file    Scream or Curse: Not on file     FAMILY HISTORY Family History  Problem Relation Age of Onset   Arthritis Mother    Hypertension Mother    Diabetes Mother    Depression Mother    Arthritis Father    Hypertension Father    Diabetes Father    Depression Father      Review of Systems: Unable to obtain due to the patient's sedation  Physical Exam: Vitals:   06/23/23 0900 06/23/23 0915  BP: (!) 115/34   Pulse: 89 89  Resp: 16 16  Temp: 97.9 F (36.6 C) 97.9 F (36.6 C)  SpO2: 100% 100%    Total I/O In: 102.1 [I.V.:102.1] Out: 70 [Chest Tube:70]  Intake/Output Summary (Last 24 hours) at 06/23/2023 1032 Last data filed at 06/23/2023 0900 Gross per 24 hour  Intake 6814.89 ml  Output 6365 ml  Net 449.89 ml   General: Lying in bed, wakes to voice and touch, no distress HEENT: anicteric sclera, MMM CV: normal rate, distant heart sounds, chest incision covered, trace edema in the legs Lungs: bilateral chest rise, mental laded, coarse upper airway sounds Abd: soft, non-tender, non-distended Skin: no visible lesions or rashes Psych: Unable to assess due to sedation Neuro: Sedated, minimally interactive  Test Results Reviewed Lab Results  Component Value Date   NA 139 06/23/2023   K 5.8 (H) 06/23/2023   CL 107 06/23/2023   CO2 20 (L) 06/23/2023   BUN 52 (H) 06/23/2023   CREATININE 9.23 (H) 06/23/2023   GFR 3.70 (LL) 09/26/2013   CALCIUM 8.0 (L) 06/23/2023   ALBUMIN 2.5 (L) 06/23/2023   PHOS 6.1 (H) 06/23/2023    I have reviewed relevant outside healthcare records     The patient is critically ill with shock, respiratory failure, hyperkalemia, ESRD and which includes my role to primarily manage ESRD and hyperkalemia.  This requires high complexity decision making.  Total critical care time: 45 minutes    Critical care time was exclusive of treating other patients.   Critical care was necessary to treat or prevent imminent or life-threatening deterioration.   Critical care was time spent personally by me on the following activities:   development of treatment plan with patient and/or surrogate as well as nursing,   discussions with other provider evaluation of patient's response to treatment  examination of patient  obtaining history from patient or surrogate  ordering and performing treatments and interventions  ordering and review of laboratory studies  ordering and review of radiographic studies

## 2023-06-23 NOTE — Consult Note (Signed)
   ELECTROPHYSIOLOGY CONSULT NOTE    Patient ID: [name] MRN: [id number], DOB/AGE: 50/25/1972 52 y.o.  Admit date: 06/22/2023 Date of Consult: 06/23/2023  Primary Physician: Donne Anon, MD Primary Cardiologist: None  Electrophysiologist: New   Referring Provider: Dr. Leafy Ro   Patient Profile: [name] is a 52 y.o. 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 Leafy Ro.  HPI:  [name] is a 51 y.o. male with medical history as above who presented to cardiac surgery.   Pt underwent Tricuspid Valve Repair, Mitral Valve Repair, Aortic Valve Replacement and MAZE 8/15 by Dr. Leafy Ro.   Post op course notable for CHB requiring continued use of pacing wires. Pressors are being weaned as tolerated and remains intubated this am. Is due for HD, but due to pressors will be initiated on CVVHD. EP asked to get on board as he may require permanent pacing.   Responds to voice but is lethargic. Patient is intubated. History has been obtained from chart review.   Labs Potassium5.8* (08/16 4098) Magnesium  1.9 (08/16 0402) Creatinine, ser  9.23* (08/16 0402) PLT  102* (08/16 0402) HGB  10.2* (08/16 0841) WBC 10.5 (08/16 0402)  .    Allergies, Medical, Surgical, Social, and Family Histories have been reviewed and are referenced here-in when relevant for medical decision making.   Physical Exam: Vitals:   06/23/23 0830 06/23/23 0845 06/23/23 0900 06/23/23 0915  BP:   (!) 115/34   Pulse: 89 89 89 89  Resp: (!) 21 (!) 22 16 16   Temp: 98.1 F (36.7 C) 98.1 F (36.7 C) 97.9 F (36.6 C) 97.9 F (36.6 C)  TempSrc:      SpO2: 100% 100% 100% 100%  Weight:      Height:       GEN- The patient is intubated and sedated. Responds to voice.  HEENT- + ET tube.  Lungs- +mechanical  breathing sounds.  Heart- Regular rate and rhythm, no murmurs, rubs or gallops GI- soft Extremities- no clubbing or cyanosis. No edema Skin- no rash or lesion Neuro- intubated and sedated.     Radiology/Studies:   Echo 04/18/2023 LVEF 55-60%, Grade III DDD, low normal RV, severe LAE, severe RAE, Mod/Sev MS, Severe TR, Mild AS with moderate calcification  [id number] EKG 8/13 shows NSR at 64 bpm with 1st degree AV block at 214 ms (personally reviewed)  TELEMETRY: AV dual pacing at 90 (personally reviewed)  Assessment/Plan:  CHB S/p AV replacement, TV repair, MV repair, MAZE, LAA clipping. Minimal conduction disease at baseline, but given the complexity of his surgery, the likelihood of him recovering conduction is low.  He is early post op.  We will follow along and check back on his conduction on Monday.  RV threshold currently <1.0V on epicardial wires.  If worsens, may need to consider temp wire.   SP AVR, MV repair, TV repair, MAZE  POD#1 Remains intubated, potential extubation today per CCM.  Chest tubes in place  ESRD Planning to start CVVHD as he is still requiring pressors and unable to tolerate HD.   For questions or updates, please contact [facility] Please consult [email or web address] for contact info under Cardiology/STEMI.  Dustin Flock, PA-C  06/23/2023 12:40 PM

## 2023-06-23 NOTE — Progress Notes (Addendum)
Notified Bartle MD of pre extubation ABG, see results review. Received verbal orders to give patient 1 amp of bicarb and keep intubated over night until day shift team rounds in AM.

## 2023-06-23 NOTE — Progress Notes (Signed)
NAME:  [name], MRN:  [id number], DOB:  [birth year], LOS: 1 ADMISSION DATE:  06/22/2023, CONSULTATION DATE: 06/22/2023 REFERRING MD: Leafy Ro - TCTS, CHIEF COMPLAINT: Postcardiac surgery  History of Present Illness:  This is a 52 year old gentleman, past medical history of anxiety depression, bipolar disease, congestive heart failure chronic diastolic heart failure kidney disease followed by renal transplant in 1998-07-30 repeat renal transplant in 2014/07/30.  Patient underwent surgery today for severe mitral regurgitation severe tricuspid regurgitation and moderate aortic stenosis.  He also has atrial fibrillation received maze procedure.  He had a complex mitral valve repair a tricuspid valve annuloplasty and aortic valve replacement followed by full right and left maze procedure.  Pertinent Medical History:   Past Medical History:  Diagnosis Date   ADHD (attention deficit hyperactivity disorder)    Anemia    Anemia, chronic disease 07/13/2011   Anxiety    Arthritis    Bipolar 2 disorder (HCC) 08/18/2011   Diagnosed in 07/30/2010.    Bipolar disorder (HCC)    Blood dyscrasia    Nephrotic Anemia   Blood transfusion    Blood transfusion without reported diagnosis    Chronic diastolic CHF (congestive heart failure) (HCC)    Complication of anesthesia    Woke up intubated gets combative  afraid to be alone    Congestive heart failure (CHF) (HCC) 07/13/2011   Onset 07-30-05.  Followed by Dr. Eden Emms.  S/p cardiac catheterization in [facility] Dr. Anne Fu..  S/p cardiac catheterization in 07/30/2013 by Nishan.  Echo 07/30/2013.    Depression    Dysrhythmia    A. 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  Deceased donor renal transplant 30-Jul-1998 at [facility], then kidney failed and went back on HD in Jul 30, 2005.  L forearm AVF . s/p repeat renal transplant 10/2014 [facility].   GI bleed 11/01/2011   Rectal bleeding with  emesis/diarrhea    Heart murmur    Hemodialysis status (HCC)    Hydrocele, right 11/25/2014   Hypertension    Hypertensive emergency 01/06/2013   Hypoglycemia 01/11/2013   Influenza-like illness 12/18/2013   Mild ascending aorta dilatation (HCC)    NICM (nonischemic cardiomyopathy) (HCC)    Pericardial effusion    a. Mod by echo in 07/30/2013, similar to prior.   Peripheral polyneuropathy 08/04/2016   Pneumonia    Pulmonary edema 01/06/2013   Pulmonary hypertension associated with end stage renal disease on dialysis [facility]) 07/13/2011   Renal transplant, status post 11/25/2014   Pt with Glomerulonephritis at age 79. Deceased donor renal transplant 30-Jul-1998 right and left renal transplant 10/2014.  Followed by [facility] transplant team; nephrologist is Dr. Lonna Cobb.    Respiratory failure with hypoxia (HCC) 01/06/2013   Shortness of breath 12/12/2012   Swollen testicle 07/13/2011   URI (upper respiratory infection) 01/25/2012    Significant Hospital Events: Including procedures, antibiotic start and stop dates in addition to other pertinent events   8/15 - AVR, MVR, TVR with TCTS (Weldner). Remains on multiple pressors (NE, Epi, vaso). 8/16 - LIJ Trialysis catheter placed for CRRT.   Interim History / Subjective:  POD#1 from AVR, MVR, TVR with Dr. Leafy Ro No significant events overnight Doing well this AM, writing on paper/board with sedation weaned Weaning on vent per SIMV/rapid wean protocol Re-sedated for LIJ Trialysis catheter placement CRRT initiation today for K 5.5 and volume removal prior to extubation Nephro  consulted Remains on NE, Epi, vaso; pressor requirements stable  Objective:  Blood pressure (!) 89/45, pulse 89, temperature 98.1 F (36.7 C), resp. rate 16, height 6\' 3"  (1.905 m), weight 93.5 kg, SpO2 100%. PAP: (36-65)/(18-29) 56/22 CVP:  [5 mmHg-14 mmHg] 8 mmHg CO:  [5.9 L/min-7.2 L/min] 7.2 L/min CI:  [2.6 L/min/m2-3.2 L/min/m2] 3.2 L/min/m2  Vent Mode:  SIMV;PRVC;PSV FiO2 (%):  [40 %-50 %] 50 % Set Rate:  [4 bmp-16 bmp] 16 bmp Vt Set:  [670 mL] 670 mL PEEP:  [5 cmH20] 5 cmH20 Pressure Support:  [5 cmH20-10 cmH20] 10 cmH20 Plateau Pressure:  [22 cmH20-26 cmH20] 22 cmH20   Intake/Output Summary (Last 24 hours) at 06/23/2023 0757 Last data filed at 06/23/2023 2725 Gross per 24 hour  Intake 7654.37 ml  Output 6295 ml  Net 1359.37 ml   Filed Weights   06/22/23 0559 06/23/23 0500  Weight: 95 kg 93.5 kg   Physical Examination: General: Acutely ill-appearing middle-aged man in NAD. HEENT: [place]/AT, anicteric sclera, PERRL, moist mucous membranes. Neck: Introducer RIJ + Pecola Leisure Trialysis. Neuro: Awake, oriented x 4. Using writing via paper/board for communication. Responds to verbal stimuli. Following commands consistently. Moves all 4 extremities spontaneously. +Cough and +Gag  CV: RRR, no m/g/r; heart sounds distant. Midline sternotomy incision covered with dressing, c/d/i/. CT in place. PULM: Breathing even and unlabored on vent (PEEP 5, FiO2 40%). Lung fields clear in upper fields, slightly diminished at bases. GI: Soft, nontender, nondistended. Hypoactive bowel sounds. Extremities: Trace bilateral symmetric LE edema noted. Fistula to RUE with +thrill. Skin: Warm/dry, no rashes.  Resolved Hospital Problem List:     Assessment & Plan:   Mitral valve regurgitation, tricuspid regurgitation and aortic stenosis status post valvular repair x 3 - POD#1 from AVR, MVR, TVR with TCTS (Weldner) - Postoperative management per TCTS - CT management per TCTS - Multimodal pain control with PAD protocol (while vented), oxycodone, morphine (caution given CKD/renal failure), tramadol; maximize adjuncts as able (APAP/lido patches)  Postoperative shock and vasoplegia - Goal MAP > 65 - NE, Epi titrated to goal MAP + vaso - Trend WBC, fever curve - F/u Cx data - Continue broad-spectrum antibiotics - Cardiac monitoring  Postoperative respiratory  failure As expected post procedurally currently on rapid weaning protocol. - Continue full vent support, SIMV/rapid wean protocol - Hopeful for extubation today, 8/16 versus 8/17 pending optimization of volume status with CRRT - Wean FiO2 for O2 sat > 90% - VAP bundle - Pulmonary hygiene - PAD protocol for sedation: Precedex for goal RASS 0 to -1  Bloody OG output Question injury from TEE probe. - PPI BID for now - Low threshold for GI consult if bleeding does not improve  ABLA, postoperative as expected Thrombocytopenia, ?chronic - Trend H&H - Monitor for signs of active bleeding - Transfuse per TCTS recommendations; Hgb < 7.0 or hemodynamically significant bleeding  Acute renal failure in the setting of hemodynamic insults on CKD History of kidney disease status post renal transplant - Trend BMP, Cr worsening - Nephrology consult for CRRT, given hemodynamic instability - LIJ Trialysis catheter placed today, 8/16 - Replete electrolytes as indicated - Monitor I&Os - Avoid nephrotoxic agents as able - Ensure adequate renal perfusion  Best Practice (right click and "Reselect all SmartList Selections" daily)   Diet/type: NPO DVT prophylaxis: prophylactic heparin  GI prophylaxis: PPI Lines: Central line Foley:  Yes, and it is still needed Code Status:  full code Last date of multidisciplinary goals of care discussion [Per  Primary Team]  Critical care time:   The patient is critically ill with multiple organ system failure and requires high complexity decision making for assessment and support, frequent evaluation and titration of therapies, advanced monitoring, review of radiographic studies and interpretation of complex data.   Critical Care Time devoted to patient care services, exclusive of separately billable procedures, described in this note is 39 minutes.  Tim Lair, PA-C [facility] 06/23/23 8:22 AM  Please see [email or web address] for pager  details.  From 7A-7P if no response, please call [phone] After hours, please call ELink [phone]

## 2023-06-23 NOTE — Progress Notes (Signed)
PHARMACY NOTE:  ANTIMICROBIAL RENAL DOSAGE ADJUSTMENT  Current antimicrobial regimen includes a mismatch between antimicrobial dosage and estimated renal function.  As per policy approved by the Pharmacy & Therapeutics and Medical Executive Committees, the antimicrobial dosage will be adjusted accordingly.  Current antimicrobial dosage:  ancef 2gm IV q8h x6 doses  Indication: surgical prophylaxis  Renal Function:  Estimated Creatinine Clearance: 11.4 mL/min (A) (by C-G formula based on SCr of 9.23 mg/dL (H)). []      On intermittent HD, scheduled: [x]      On CRRT    Antimicrobial dosage has been changed to:  2gm IV q12h x3 more doses  Additional comments:   Thank you for allowing pharmacy to be a part of this patient's care.  Harland German, PharmD Clinical Pharmacist **Pharmacist phone directory can now be found on [email or web address] ([email or web address]).  Listed under [facility].

## 2023-06-23 NOTE — Procedures (Signed)
Central Venous Catheter Insertion Procedure Note  [name]  [id number]  July 02, 1971  Date:06/23/23  Time:10:48 AM   Provider Performing:[name]   Procedure: Insertion of Non-tunneled Central Venous Catheter(36556)with US guidance (78295)    Indication(s) Medication administration and Hemodialysis  Consent Risks of the procedure as well as the alternatives and risks of each were explained to the patient and/or caregiver.  Consent for the procedure was obtained and is signed in the bedside chart (obtained via phone from patient's Aunt, [name]).  Anesthesia Topical only with 1% lidocaine   Timeout Verified patient identification, verified procedure, site/side was marked, verified correct patient position, special equipment/implants available, medications/allergies/relevant history reviewed, required imaging and test results available.  Sterile Technique Maximal sterile technique including full sterile barrier drape, hand hygiene, sterile gown, sterile gloves, mask, hair covering, sterile ultrasound probe cover (if used).  Procedure Description Area of catheter insertion was cleaned with chlorhexidine and draped in sterile fashion.   With real-time ultrasound guidance a HD catheter was placed into the left internal jugular vein.  Nonpulsatile blood flow and easy flushing noted in all ports.  The catheter was sutured in place and sterile dressing applied.    Complications/Tolerance None; patient tolerated the procedure well. Chest X-ray is ordered to verify placement for internal jugular or subclavian cannulation.  Chest x-ray is not ordered for femoral cannulation.  EBL Minimal  Specimen(s) None  [name], [place] [facility] 06/23/23 10:49 AM  Please see [email or web address] for pager details.  From 7A-7P if no response, please call [phone] After hours, please call ELink [phone]

## 2023-06-23 NOTE — Progress Notes (Addendum)
Pt placed back on full vent support per CCM for HD cath placement. Pt tolerating well at this time, Vitals stable, RN at bedside, CCM aware, RT will monitor as needed.     06/23/23 0840  Adult Ventilator Settings  Vent Mode (S)  PRVC;SIMV;PSV  Vt Set 670 mL  Set Rate 16 bmp  FiO2 (%) 40 %  Pressure Support 10 cmH20  PEEP 5 cmH20

## 2023-06-23 NOTE — Progress Notes (Signed)
   06/23/23 0040  Adult Ventilator Settings  Vent Type Servo i  Humidity HME  Vent Mode (S)  PRVC;PSV;SIMV  Vt Set (S)  670 mL  Set Rate (S)  16 bmp  FiO2 (%) (S)  50 %  Pressure Support (S)  10 cmH20  PEEP 5 cmH20   Pt weaned failed due to tachypnea, increased WOB, and decreased minute ventilation. Will reattempt wean per protocol.

## 2023-06-23 NOTE — Progress Notes (Signed)
   06/23/23 0314  Vent Select  Invasive or Noninvasive Invasive  Adult Vent Y  Airway 8 mm  Placement Date/Time: 06/22/23 (c) 0801   Grade View: Grade 1  Airway Device: Endotracheal Tube  Laryngoscope Blade: 4  ETT Types: Oral  Size (mm): 8 mm  Cuffed: Min.occ.pres.  Insertion attempts: 2  Placement Confirmation: ETCO2 (Capnography);Bilatera...  Secured at (cm) 24 cm  Measured From Lips  Secured Location Left  Secured By (S)  Commercial Tube Holder  Tube Holder Repositioned Yes  Prone position No  Cuff Pressure (cm H2O) Clear OR 27-39 CmH2O  Site Condition Dry  Adult Ventilator Settings  Vent Type Servo i  Humidity HME  Vent Mode (S)  SIMV;PRVC;PSV  Vt Set (S)  670 mL  Set Rate (S)  16 bmp  FiO2 (%) (S)  50 %  Pressure Support (S)  10 cmH20  PEEP 5 cmH20  Adult Ventilator Measurements  Peak Airway Pressure 24 L/min  Mean Airway Pressure 10 cmH20  Plateau Pressure 22 cmH20  Resp Rate Spontaneous 3 br/min  Resp Rate Total 19 br/min  Exhaled Vt 665 mL  Spont TV 655 mL  Measured Ve 10.7 L  Auto PEEP 0 cmH20  Total PEEP 5 cmH20  SpO2 100 %  Adult Ventilator Alarms  Alarms On Y  Ve High Alarm 21 L/min  Ve Low Alarm 4 L/min  Resp Rate High Alarm 38 br/min  Resp Rate Low Alarm 10  PEEP Low Alarm 3 cmH2O  Press High Alarm 40 cmH2O  VAP Prevention  HOB> 30 Degrees Y  Breath Sounds  Bilateral Breath Sounds Clear;Diminished  Suction Method  Respiratory Interventions Airway suction  Airway Suctioning/Secretions  Suction Type ETT  Suction Device  Catheter  Secretion Amount Small  Secretion Color Clear  Secretion Consistency Thin  Suction Tolerance Tolerated well  Suctioning Adverse Effects None   Per MD pt flipped back to original settings and hold further rapid weaning until after 0700.

## 2023-06-24 ENCOUNTER — Encounter (HOSPITAL_COMMUNITY): Payer: Self-pay | Admitting: Thoracic Surgery (Cardiothoracic Vascular Surgery)

## 2023-06-24 DIAGNOSIS — Z9889 Other specified postprocedural states: Secondary | ICD-10-CM | POA: Diagnosis not present

## 2023-06-24 LAB — CBC
HCT: 22.7 % — ABNORMAL LOW (ref 39.0–52.0)
Hemoglobin: 7.4 g/dL — ABNORMAL LOW (ref 13.0–17.0)
MCH: 33 pg (ref 26.0–34.0)
MCHC: 32.6 g/dL (ref 30.0–36.0)
MCV: 101.3 fL — ABNORMAL HIGH (ref 80.0–100.0)
Platelets: 109 10*3/uL — ABNORMAL LOW (ref 150–400)
RBC: 2.24 MIL/uL — ABNORMAL LOW (ref 4.22–5.81)
RDW: 14.7 % (ref 11.5–15.5)
WBC: 10.6 10*3/uL — ABNORMAL HIGH (ref 4.0–10.5)
nRBC: 0.2 % (ref 0.0–0.2)

## 2023-06-24 LAB — GLUCOSE, CAPILLARY
Glucose-Capillary: 105 mg/dL — ABNORMAL HIGH (ref 70–99)
Glucose-Capillary: 117 mg/dL — ABNORMAL HIGH (ref 70–99)
Glucose-Capillary: 119 mg/dL — ABNORMAL HIGH (ref 70–99)
Glucose-Capillary: 121 mg/dL — ABNORMAL HIGH (ref 70–99)
Glucose-Capillary: 122 mg/dL — ABNORMAL HIGH (ref 70–99)
Glucose-Capillary: 132 mg/dL — ABNORMAL HIGH (ref 70–99)

## 2023-06-24 LAB — RENAL FUNCTION PANEL
Albumin: 2.4 g/dL — ABNORMAL LOW (ref 3.5–5.0)
Albumin: 2.6 g/dL — ABNORMAL LOW (ref 3.5–5.0)
Anion gap: 13 (ref 5–15)
Anion gap: 9 (ref 5–15)
BUN: 33 mg/dL — ABNORMAL HIGH (ref 6–20)
BUN: 28 mg/dL — ABNORMAL HIGH (ref 6–20)
CO2: 21 mmol/L — ABNORMAL LOW (ref 22–32)
CO2: 23 mmol/L (ref 22–32)
Calcium: 7.8 mg/dL — ABNORMAL LOW (ref 8.9–10.3)
Calcium: 8.1 mg/dL — ABNORMAL LOW (ref 8.9–10.3)
Chloride: 100 mmol/L (ref 98–111)
Chloride: 104 mmol/L (ref 98–111)
Creatinine, Ser: 5.7 mg/dL — ABNORMAL HIGH (ref 0.61–1.24)
Creatinine, Ser: 4.61 mg/dL — ABNORMAL HIGH (ref 0.61–1.24)
GFR, Estimated: 11 mL/min — ABNORMAL LOW (ref >60)
GFR, Estimated: 15 mL/min — ABNORMAL LOW (ref >60)
Glucose, Bld: 120 mg/dL — ABNORMAL HIGH (ref 70–99)
Glucose, Bld: 107 mg/dL — ABNORMAL HIGH (ref 70–99)
Phosphorus: 4.5 mg/dL (ref 2.5–4.6)
Phosphorus: 5 mg/dL — ABNORMAL HIGH (ref 2.5–4.6)
Potassium: 3.9 mmol/L (ref 3.5–5.1)
Potassium: 4.5 mmol/L (ref 3.5–5.1)
Sodium: 134 mmol/L — ABNORMAL LOW (ref 135–145)
Sodium: 136 mmol/L (ref 135–145)

## 2023-06-24 LAB — MAGNESIUM: Magnesium: 2.1 mg/dL (ref 1.7–2.4)

## 2023-06-24 MED ORDER — PRISMASOL BGK 4/2.5 32-4-2.5 MEQ/L REPLACEMENT SOLN: Status: DC

## 2023-06-24 MED ORDER — ALBUMIN HUMAN 5 % IV SOLN
25.0000 g | Freq: Once | INTRAVENOUS | Status: AC
  Administered 2023-06-24: 25 g via INTRAVENOUS

## 2023-06-24 MED ORDER — PHENOL 1.4 % MT LIQD
1.0000 | OROMUCOSAL | Status: DC | PRN
  Administered 2023-06-24 – 2023-07-29 (×21): 1 via OROMUCOSAL
  Filled 2023-06-24 (×3): qty 177

## 2023-06-24 MED ORDER — PRISMASOL BGK 4/2.5 32-4-2.5 MEQ/L EC SOLN: Status: DC

## 2023-06-24 MED ORDER — ORAL CARE MOUTH RINSE: 15.0000 mL | OROMUCOSAL | Status: DC | PRN

## 2023-06-24 MED ORDER — ALBUMIN HUMAN 5 % IV SOLN
25.0000 g | Freq: Once | INTRAVENOUS | Status: AC
  Administered 2023-06-24: 25 g via INTRAVENOUS
  Filled 2023-06-24: qty 500

## 2023-06-24 NOTE — Progress Notes (Signed)
Patient Profile: [name] is a 52 y.o. male with a history of Anxiety, depression, Bipolar disease, CHF, CKD s/p renal transplant 1998-07-26 -> redo Jul 26, 2014 -> ESRD/HD, and severe MR/severe TR/Moderate AS who is being seen today for the evaluation of heart block s/p cardiac surgery at the request of Dr. Leafy Ro.  Patient Name: [name]      SUBJECTIVE:intubated but opens eyes to voice   Past Medical History:  Diagnosis Date   ADHD (attention deficit hyperactivity disorder)    Anemia    Anemia, chronic disease 07/13/2011   Anxiety    Arthritis    Bipolar 2 disorder (HCC) 08/18/2011   Diagnosed in 2010/07/26.    Bipolar disorder (HCC)    Blood dyscrasia    Nephrotic Anemia   Blood transfusion    Blood transfusion without reported diagnosis    Chronic diastolic CHF (congestive heart failure) (HCC)    Complication of anesthesia    Woke up intubated gets combative  afraid to be alone    Congestive heart failure (CHF) (HCC) 07/13/2011   Onset 07-26-05.  Followed by Dr. Eden Emms.  S/p cardiac catheterization in [facility] Dr. Anne Fu..  S/p cardiac catheterization in 2013/07/26 by Nishan.  Echo 2013-07-26.    Depression    Dysrhythmia    A. Fib 27-Jul-2023 with DCCV in May 2024   Erectile dysfunction    ESRD on hemodialysis (HCC) 07/13/2011   Glomerulonephritis at age 71, started HD in July 27, 1995.  Deceased donor renal transplant 1998/07/26 at [facility], then kidney failed and went back on HD in 07/26/05.  L forearm AVF . s/p repeat renal transplant 10/2014 [facility].   GI bleed 11/01/2011   Rectal bleeding with emesis/diarrhea    Heart murmur    Hemodialysis status (HCC)    Hydrocele, right 11/25/2014   Hypertension    Hypertensive emergency 01/06/2013   Hypoglycemia 01/11/2013   Influenza-like illness 12/18/2013   Mild ascending aorta dilatation (HCC)    NICM (nonischemic cardiomyopathy) (HCC)    Pericardial effusion    a. Mod by echo in 2013-07-26, similar to prior.   Peripheral polyneuropathy 08/04/2016   Pneumonia    Pulmonary  edema 01/06/2013   Pulmonary hypertension associated with end stage renal disease on dialysis [facility]) 07/13/2011   Renal transplant, status post 11/25/2014   Pt with Glomerulonephritis at age 64. Deceased donor renal transplant 1998/07/26 right and left renal transplant 10/2014.  Followed by [facility] transplant team; nephrologist is Dr. Lonna Cobb.    Respiratory failure with hypoxia (HCC) 01/06/2013   Shortness of breath 12/12/2012   Swollen testicle 07/13/2011   URI (upper respiratory infection) 01/25/2012    Scheduled Meds:  Scheduled Meds:  acetaminophen  1,000 mg Oral Q6H   Or   acetaminophen (TYLENOL) oral liquid 160 mg/5 mL  1,000 mg Per Tube Q6H   acyclovir  200 mg Per Tube BID   atorvastatin  10 mg Per Tube QHS   bisacodyl  10 mg Oral Daily   Or   bisacodyl  10 mg Rectal Daily   Chlorhexidine Gluconate Cloth  6 each Topical Daily   docusate sodium  200 mg Oral Daily   insulin aspart  0-24 Units Subcutaneous Q4H   lidocaine-prilocaine  1 Application Topical Q M,W,F   mouth rinse  15 mL Mouth Rinse Q2H   pantoprazole (PROTONIX) IV  40 mg Intravenous Q12H   sodium chloride flush  3 mL Intravenous Q12H   Continuous Infusions:  sodium chloride Stopped (06/23/23 1106)  sodium chloride     sodium chloride 10 mL/hr at 06/24/23 0700   albumin human Stopped (06/23/23 0040)    ceFAZolin (ANCEF) IV Stopped (06/23/23 2144)   dexmedetomidine (PRECEDEX) IV infusion 0.2 mcg/kg/hr (06/24/23 0700)   epinephrine 6 mcg/min (06/24/23 0700)   lactated ringers     lactated ringers     nitroGLYCERIN     norepinephrine (LEVOPHED) Adult infusion 11 mcg/min (06/24/23 0700)   phenylephrine (NEO-SYNEPHRINE) Adult infusion 0 mcg/min (06/22/23 1537)   prismasol BGK 2/2.5 dialysis solution 1,500 mL/hr at 06/24/23 0549   prismasol BGK 2/2.5 replacement solution 500 mL/hr at 06/24/23 0549   prismasol BGK 2/2.5 replacement solution 300 mL/hr at 06/23/23 2314   vasopressin 0.04 Units/min (06/24/23  0700)   sodium chloride, albumin human, clonazePAM, heparin, metoprolol tartrate, midazolam, morphine injection, ondansetron (ZOFRAN) IV, mouth rinse, oxyCODONE, sodium chloride, sodium chloride flush, traMADol    PHYSICAL EXAM Vitals:   06/24/23 0600 06/24/23 0700 06/24/23 0715 06/24/23 0730  BP: (!) 117/45 (!) 126/19    Pulse: 80 80 80 80  Resp: 11 16 16 14   Temp: (!) 97.5 F (36.4 C) (!) 97.5 F (36.4 C) (!) 97.5 F (36.4 C) (!) 97.5 F (36.4 C)  TempSrc:      SpO2: 100% 100% 100% 100%  Weight:      Height:        Well developed and nourished in no acute distress HENT normal Neck supple with JVP-  flat  Clear Regular rate and rhythm, no murmurs or gallops Abd-soft with active BS No Clubbing cyanosis edema Skin-warm and dry A & Oriented  Grossly normal sensory and motor function   TELEMETRY: Reviewed personnally pt with Vpacing  Underlying junction rhythm but could nt program AV delay long enught to program DDD with AV conduction :  ECG personally reviewed   Intake/Output Summary (Last 24 hours) at 06/24/2023 0751 Last data filed at 06/24/2023 0700 Gross per 24 hour  Intake 1849.73 ml  Output 1625.7 ml  Net 224.03 ml    LABS: Basic Metabolic Panel: Recent Labs  Lab 06/20/23 1130 06/22/23 0648 06/22/23 1146 06/22/23 1220 06/22/23 1250 06/22/23 1341 06/22/23 1618 06/22/23 1620 06/22/23 2147 06/22/23 2147 06/23/23 0301 06/23/23 0402 06/23/23 0841 06/23/23 1619 06/24/23 0411  NA 135   < > 142   < > 140   < > 143 144 137  --  141 139 139 136  137 134*  K 5.6*   < > 6.1*   < > 6.4*   < > 3.8 3.9 4.6  --  5.6* 5.5* 5.8* 5.1  5.1 3.9  CL 97*   < > 106  --  107  --  109  --  106  --   --  107  --  102  106 100  CO2 23  --   --   --   --   --  20*  --  18*  --   --  20*  --  19*  19* 21*  GLUCOSE 71   < > 90  --  76  --  30*  --  132*  --   --  90  --  103*  103* 120*  BUN 52*   < > 41*  --  42*  --  46*  --  47*  --   --  52*  --  48*  50* 33*   CREATININE 10.48*   < > 9.00*  --  9.10*  --  8.62*  --  8.73*  --   --  9.23*  --  8.16*  8.01* 5.70*  CALCIUM 9.0  --   --   --   --   --  7.5*  --  7.5*  --   --  8.0*  --  7.7*  7.9* 7.8*  MG  --   --   --   --   --    < > 2.2  --  1.9  --   --  1.9  --  2.1 2.1  PHOS  --   --   --   --   --   --   --   --   --    < >  --  6.1*  --  5.8* 4.5   < > = values in this interval not displayed.   Cardiac Enzymes: No results for input(s): "CKTOTAL", "CKMB", "CKMBINDEX", "TROPONINI" in the last 72 hours. CBC: Recent Labs  Lab 06/20/23 1130 06/22/23 0648 06/22/23 1232 06/22/23 1250 06/22/23 1618 06/22/23 1620 06/22/23 2147 06/23/23 0301 06/23/23 0402 06/23/23 0841 06/23/23 1619 06/24/23 0411  WBC 2.2*  --   --   --  8.5  --  8.5  --  10.5  --  11.8* 10.6*  HGB 11.5*   < > 8.1*   < > 9.2* 10.5* 8.6* 9.2* 8.2* 10.2* 7.9* 7.4*  HCT 36.0*   < > 26.0*   < > 29.1* 31.0* 27.1* 27.0* 25.9* 30.0* 24.7* 22.7*  MCV 102.0*  --   --   --  103.2*  --  103.0*  --  102.0*  --  101.6* 101.3*  PLT 100*  --  102*  --  88*  --  82*  --  102*  --  104* 109*   < > = values in this interval not displayed.   PROTIME: Recent Labs    06/22/23 1618  LABPROT 29.3*  INR 2.8*   Liver Function Tests: Recent Labs    06/23/23 1619 06/24/23 0411  ALBUMIN 2.6* 2.4*   No results for input(s): "LIPASE", "AMYLASE" in the last 72 hours. BNP: BNP (last 3 results) No results for input(s): "BNP" in the last 8760 hours.  ProBNP (last 3 results) No results for input(s): "PROBNP" in the last 8760 hours.  D-Dimer: No results for input(s): "DDIMER" in the last 72 hours. Hemoglobin A1C: No results for input(s): "HGBA1C" in the last 72 hours. Fasting Lipid Panel: No results for input(s): "CHOL", "HDL", "LDLCALC", "TRIG", "CHOLHDL", "LDLDIRECT" in the last 72 hours. Thyroid Function Tests: No results for input(s): "TSH", "T4TOTAL", "T3FREE", "THYROIDAB" in the last 72 hours.  Invalid input(s):  "FREET3" Anemia Panel: No results for input(s): "VITAMINB12", "FOLATE", "FERRITIN", "TIBC", "IRON", "RETICCTPCT" in the last 72 hours.   Device Interrogation: (As above)    ASSESSMENT AND PLAN:  Complete Heart block  S/p MVR/AVR/TVR/MAZE/LAAL  ESRD on RRT (prior Tx 1999/2015  Underlying junctional rhyhtm  hopefully will continue to recover sinus node fyunction  which was absent this am  Signed, Sherryl Manges MD  06/24/2023

## 2023-06-24 NOTE — Procedures (Signed)
Extubation Procedure Note  Patient Details:   Name: [name] DOB: 1971-11-01 MRN: [id number]   Airway Documentation:    Vent end date: (not recorded) Vent end time: (not recorded)   Evaluation  O2 sats: stable throughout Complications: No apparent complications Patient did tolerate procedure well. Bilateral Breath Sounds: Clear, Diminished   Yes, pt could speak post extubation.  Pt extubated to 4 l/m [place] per physician's order without difficulty.  Audrie Lia 06/24/2023, 10:15 AM

## 2023-06-24 NOTE — Progress Notes (Signed)
NAME:  Nieves Jessee, MRN:  [id number], DOB:  1970-12-30, LOS: 2 ADMISSION DATE:  06/22/2023, CONSULTATION DATE: 06/22/2023 REFERRING MD: Leafy Ro - TCTS, CHIEF COMPLAINT: Postcardiac surgery  History of Present Illness:  This is a 52 year old gentleman, past medical history of anxiety depression, bipolar disease, congestive heart failure chronic diastolic heart failure kidney disease followed by renal transplant in 31-Jul-1998 repeat renal transplant in 2014/07/31.  Patient underwent surgery today for severe mitral regurgitation severe tricuspid regurgitation and moderate aortic stenosis.  He also has atrial fibrillation received maze procedure.  He had a complex mitral valve repair a tricuspid valve annuloplasty and aortic valve replacement followed by full right and left maze procedure.  Pertinent Medical History:   Past Medical History:  Diagnosis Date   ADHD (attention deficit hyperactivity disorder)    Anemia    Anemia, chronic disease 07/13/2011   Anxiety    Arthritis    Bipolar 2 disorder (HCC) 08/18/2011   Diagnosed in July 31, 2010.    Bipolar disorder (HCC)    Blood dyscrasia    Nephrotic Anemia   Blood transfusion    Blood transfusion without reported diagnosis    Chronic diastolic CHF (congestive heart failure) (HCC)    Complication of anesthesia    Woke up intubated gets combative  afraid to be alone    Congestive heart failure (CHF) (HCC) 07/13/2011   Onset 31-Jul-2005.  Followed by Dr. Eden Emms.  S/p cardiac catheterization in [facility] Dr. Anne Fu..  S/p cardiac catheterization in 07-31-13 by Nishan.  Echo 2013/07/31.    Depression    Dysrhythmia    A. Fib 2023-08-01 with DCCV in May 2024   Erectile dysfunction    ESRD on hemodialysis (HCC) 07/13/2011   Glomerulonephritis at age 49, started HD in 1995/08/01.  Deceased donor renal transplant July 31, 1998 at [facility], then kidney failed and went back on HD in 2005/07/31.  L forearm AVF . s/p repeat renal transplant 10/2014 [facility].   GI bleed 11/01/2011   Rectal bleeding with  emesis/diarrhea    Heart murmur    Hemodialysis status (HCC)    Hydrocele, right 11/25/2014   Hypertension    Hypertensive emergency 01/06/2013   Hypoglycemia 01/11/2013   Influenza-like illness 12/18/2013   Mild ascending aorta dilatation (HCC)    NICM (nonischemic cardiomyopathy) (HCC)    Pericardial effusion    a. Mod by echo in 07/31/13, similar to prior.   Peripheral polyneuropathy 08/04/2016   Pneumonia    Pulmonary edema 01/06/2013   Pulmonary hypertension associated with end stage renal disease on dialysis [facility]) 07/13/2011   Renal transplant, status post 11/25/2014   Pt with Glomerulonephritis at age 22. Deceased donor renal transplant 07-31-98 right and left renal transplant 10/2014.  Followed by [facility] transplant team; nephrologist is Dr. Lonna Cobb.    Respiratory failure with hypoxia (HCC) 01/06/2013   Shortness of breath 12/12/2012   Swollen testicle 07/13/2011   URI (upper respiratory infection) 01/25/2012    Significant Hospital Events: Including procedures, antibiotic start and stop dates in addition to other pertinent events   8/15 - AVR, MVR, TVR with TCTS ([facility]ner). Remains on multiple pressors (NE, Epi, vaso). 8/16 - LIJ Trialysis catheter placed for CRRT.   Interim History / Subjective:   Tolerating SAT SBT.   Objective:  Blood pressure (!) 118/30, pulse 80, temperature 97.9 F (36.6 C), resp. rate (!) 30, height 6\' 3"  (1.905 m), weight 93.7 kg, SpO2 98%. PAP: (33-56)/(21-32) 36/30 CVP:  [2 mmHg-11 mmHg] 5 mmHg CO:  [8.7 L/min-9 L/min]  8.8 L/min CI:  [3.9 L/min/m2-4.01 L/min/m2] 3.95 L/min/m2  Vent Mode: PSV;CPAP FiO2 (%):  [36 %-40 %] 36 % Set Rate:  [16 bmp] 16 bmp Vt Set:  [670 mL] 670 mL PEEP:  [5 cmH20] 5 cmH20 Pressure Support:  [5 cmH20-10 cmH20] 5 cmH20 Plateau Pressure:  [22 cmH20-26 cmH20] 23 cmH20   Intake/Output Summary (Last 24 hours) at 06/24/2023 1257 Last data filed at 06/24/2023 1200 Gross per 24 hour  Intake 1824.33 ml  Output  1609.7 ml  Net 214.63 ml   Filed Weights   06/22/23 0559 06/23/23 0500 06/24/23 0500  Weight: 95 kg 93.5 kg 93.7 kg   Physical Examination: General: intubated on life support  HEENT: NCAT tracking  Neuro: alert following commands   CV: RRR, s1 s2 PULM: CTAB GI: soft nt nd  Extremities: no edema   Resolved Hospital Problem List:     Assessment & Plan:   Mitral valve regurgitation, tricuspid regurgitation and aortic stenosis status post valvular repair x 3 - POD#1 from AVR, MVR, TVR with TCTS ([facility]ner) - Postoperative management per TCTS - CT management per TCTS - Multimodal pain control with PAD protocol (while vented), oxycodone, morphine (caution given CKD/renal failure), tramadol; maximize adjuncts as able (APAP/lido patches)  Postoperative shock and vasoplegia - titrating off pressors  Postoperative respiratory failure - SAT SBT - plans for extubation today   Bloody OG output Question injury from TEE probe. - PPI BID   ABLA, postoperative as expected Thrombocytopenia, ?chronic - Trend H&H - Monitor for signs of active bleeding - Transfuse per TCTS recommendations; Hgb < 7.0 or hemodynamically significant bleeding  Acute renal failure in the setting of hemodynamic insults on CKD History of kidney disease status post renal transplant - started CVVHD  - per nephro  Best Practice (right click and "Reselect all SmartList Selections" daily)   Diet/type: NPO DVT prophylaxis: prophylactic heparin  GI prophylaxis: PPI Lines: Central line Foley:  Yes, and it is still needed Code Status:  full code Last date of multidisciplinary goals of care discussion [Per Primary Team]  Critical care time:   This patient is critically ill with multiple organ system failure; which, requires frequent high complexity decision making, assessment, support, evaluation, and titration of therapies. This was completed through the application of advanced monitoring technologies and  extensive interpretation of multiple databases. During this encounter critical care time was devoted to patient care services described in this note for 32 minutes.  Josephine Igo, DO [facility] 06/24/2023 12:57 PM

## 2023-06-24 NOTE — Progress Notes (Signed)
[facility] Progress Note  Subjective: seen in ICU, RN updated me on the drips, keeping even w/ CRRT  Vitals:   06/24/23 0815 06/24/23 0830 06/24/23 0845 06/24/23 0900  BP:    (!) 129/31  Pulse: 80 80 80 80  Resp: 16 16 16 16   Temp: 97.7 F (36.5 C) 97.9 F (36.6 C) 97.9 F (36.6 C) 98.1 F (36.7 C)  TempSrc:      SpO2: 100% 100% 100% 100%  Weight:      Height:        Exam: Gen on vent, sedated No rash, cyanosis or gangrene Sclera anicteric, throat w/ ETT No jvd or bruits Chest clear anterior/ lateral RRR no RG Abd soft ntnd no mass or ascites +bs GU normal male MS no joint effusions or deformity Ext  1+ pretib LE edema, no other sig edema Neuro is on vent, sedated    OP HD: [place] triad 3h    F250     94kg   2K/2.5 Ca bath     Assessment/Recommendations: Carlous Lenhart is a/an 52 y.o. male with a past medical history notable for ESRD on HD admitted with MV, AV, and TV issues s/p repair/replacement.    # ESRD - pt was too hypotensive for hemodialysis post-op. Started on CRRT 8/16. Will change bags to 4/2.5 today w/ controlled K+.    # Volume/ hypertension - adjust ultrafiltration as needed on CRRT, keeping even for now. Not sure is wt's are accurate, if so he is at his dry wt. Mild LE edema, no UE edema.      # Anemia esrd - Hb down to 7s post op due to surgical losses.  Transfusions as needed.  Consider ESA   # MBD ckd - Phosphorus 6.1 yesterday.  Continue to monitor.  Calcium 8 but corrects near normal when accounting for albumin.   # Vascular access - RUE AVF functioning well but unable to use due to shock.  Temporary dialysis catheter placed for CRRT.   # Hyperkalemia - resolved, will change to 4/2.5 bags.    # Shock - post op, remains on 3 pressors today   # Postoperative respiratory failure - ventilator dependent at this time    # Tricuspid, aortic, mitral valve insufficiencies - Status post Tri valve repair on 8/15.  Per cardiothoracic  surgery    [name] MD  [facility] 06/24/2023, 10:35 AM  Recent Labs  Lab 06/23/23 1619 06/24/23 0411  HGB 7.9* 7.4*  ALBUMIN 2.6* 2.4*  CALCIUM 7.7*  7.9* 7.8*  PHOS 5.8* 4.5  CREATININE 8.16*  8.01* 5.70*  K 5.1  5.1 3.9   No results for input(s): "IRON", "TIBC", "FERRITIN" in the last 168 hours. Inpatient medications:  acetaminophen  1,000 mg Oral Q6H   Or   acetaminophen (TYLENOL) oral liquid 160 mg/5 mL  1,000 mg Per Tube Q6H   acyclovir  200 mg Per Tube BID   atorvastatin  10 mg Per Tube QHS   bisacodyl  10 mg Oral Daily   Or   bisacodyl  10 mg Rectal Daily   Chlorhexidine Gluconate Cloth  6 each Topical Daily   docusate sodium  200 mg Oral Daily   insulin aspart  0-24 Units Subcutaneous Q4H   lidocaine-prilocaine  1 Application Topical Q M,W,F   mouth rinse  15 mL Mouth Rinse Q2H   pantoprazole (PROTONIX) IV  40 mg Intravenous Q12H   sodium chloride flush  3 mL Intravenous Q12H  prismasol BGK 4/2.5      prismasol BGK 4/2.5     sodium chloride Stopped (06/23/23 1106)   sodium chloride     sodium chloride 10 mL/hr at 06/24/23 0900   albumin human Stopped (06/23/23 0040)    ceFAZolin (ANCEF) IV Stopped (06/23/23 2144)   dexmedetomidine (PRECEDEX) IV infusion 0.3 mcg/kg/hr (06/24/23 0900)   epinephrine 6 mcg/min (06/24/23 0900)   lactated ringers     lactated ringers     nitroGLYCERIN     norepinephrine (LEVOPHED) Adult infusion 11 mcg/min (06/24/23 0900)   phenylephrine (NEO-SYNEPHRINE) Adult infusion 0 mcg/min (06/22/23 1537)   prismasol BGK 4/2.5     vasopressin 0.04 Units/min (06/24/23 0900)   sodium chloride, albumin human, clonazePAM, heparin, metoprolol tartrate, midazolam, morphine injection, ondansetron (ZOFRAN) IV, mouth rinse, oxyCODONE, sodium chloride, sodium chloride flush, traMADol

## 2023-06-24 NOTE — Progress Notes (Signed)
Initial Nutrition Assessment  DOCUMENTATION CODES:   Not applicable  INTERVENTION:   RD will monitor for diet advancement vs the need for nutrition support.   RD will add supplements with diet advancement   Recommend rena-vit po daily   Recommend vitamin C 500mg  po daily  Pt is at refeed risk  Daily weights   NUTRITION DIAGNOSIS:   Inadequate oral intake related to inability to eat as evidenced by NPO status.  GOAL:   Patient will meet greater than or equal to 90% of their needs  MONITOR:   Labs, Weight trends, Skin, I & O's, Diet advancement  REASON FOR ASSESSMENT:   Consult Assessment of nutrition requirement/status  ASSESSMENT:   52 y/o male with h/o glomerulonephritis (age 59) s/p renal transplant (1999) and right and left renal transplants (2015), ESRD on HD (>20 yrs), HTN, bipolar 2 disorder, PTSD, ADHD, anxiety, depression, GERD, OSA, NICM, thrombocytopenia, pulmonary hypertension, afib s/p DCCV (03/2019), CHF, severe mitral and tricuspid valve regurgitation and moderate aortic stenosis s/p mitral valve repair, tricuspid valve annuloplasty, aortic valve replacement and full right and left maze procedure 8/16.  RD working remotely.  Pt extubated this morning. RD will monitor for diet advancement vs the need for nutrition support. Pt is likely at refeed risk.  CRRT initiated yesterday. Per chart, pt appears fairly weight stable at baseline. Pt's dry weight is noted ~200lbs. RD will obtain nutrition related history and exam at follow up.    Medications reviewed and include: dulcolax, colace, insulin, protonix, epinephrine, levophed, vasopressin   Labs reviewed: Na 134(L), K 3.9 wnl, BUN 33(H), creat 5.70(H), P 4.5 wnl, Mg 2.1 wnl Wbc- 10.6(H), Hgb 7.4(L), Hct 22.7(L) Cbgs- 132, 122 x 24 hrs  AIC 5.3- 8/13  Chest tube- output   NUTRITION - FOCUSED PHYSICAL EXAM: Unable to perform at this time   Diet Order:   Diet Order     None      EDUCATION  NEEDS:   Not appropriate for education at this time  Skin:  Skin Assessment: Reviewed RN Assessment (incision chest)  Last BM:  pta  Height:   Ht Readings from Last 1 Encounters:  06/22/23 6\' 3"  (1.905 m)    Weight:   Wt Readings from Last 1 Encounters:  06/24/23 93.7 kg    Ideal Body Weight:  89 kg  BMI:  Body mass index is 25.82 kg/m.  Estimated Nutritional Needs:   Kcal:  2500-2800kcal/day  Protein:  125-140g/day  Fluid:  UOP +1L  Betsey Holiday MS, RD, LDN Please refer to [facility] for RD and/or RD on-call/weekend/after hours pager

## 2023-06-24 NOTE — Progress Notes (Signed)
[address]       [name] [address]             [phone]                 2 Days Post-Op Procedure(s) (LRB): TRICUSPID VALVE REPAIR MC3 Ring size 30 (N/A) MITRAL VALVE REPAIR (MVR) utilizing Simulis Band size 30mm (N/A) AORTIC VALVE REPLACEMENT (AVR) inspiris valve size 27 (N/A) MAZE (N/A) TRANSESOPHAGEAL ECHOCARDIOGRAM (N/A)   Events: No events Remains intubated On CRRT, pulling fluid _______________________________________________________________ Vitals: BP (!) 129/31   Pulse 80   Temp 98.1 F (36.7 C)   Resp 16   Ht 6\' 3"  (1.905 m)   Wt 93.7 kg   SpO2 100%   BMI 25.82 kg/m  Filed Weights   06/22/23 0559 06/23/23 0500 06/24/23 0500  Weight: 95 kg 93.5 kg 93.7 kg     - Neuro: arousable  - Cardiovascular: paced  Drips: epi6, levo 11, vaso 0.04.   PAP: (33-56)/(21-32) 36/30 CVP:  [2 mmHg-11 mmHg] 8 mmHg CO:  [8.7 L/min-9 L/min] 8.8 L/min CI:  [3.9 L/min/m2-4.01 L/min/m2] 3.95 L/min/m2  - Pulm:  Vent Mode: SIMV;PSV;PRVC FiO2 (%):  [40 %] 40 % Set Rate:  [16 bmp] 16 bmp Vt Set:  [670 mL] 670 mL PEEP:  [5 cmH20] 5 cmH20 Pressure Support:  [10 cmH20] 10 cmH20 Plateau Pressure:  [22 cmH20-26 cmH20] 23 cmH20  ABG    Component Value Date/Time   PHART 7.300 (L) 06/23/2023 0841   PCO2ART 40.0 06/23/2023 0841   PO2ART 133 (H) 06/23/2023 0841   HCO3 19.7 (L) 06/23/2023 0841   TCO2 21 (L) 06/23/2023 0841   ACIDBASEDEF 6.0 (H) 06/23/2023 0841   O2SAT 99 06/23/2023 0841    - Abd: ND - Extremity: warm  .Intake/Output      08/16 0701 08/17 0700 08/17 0701 08/18 0700   I.V. (mL/kg) 1394.9 (14.9) 107.2 (1.1)   Blood     NG/GT 255    IV Piggyback 199.9    Total Intake(mL/kg) 1849.7 (19.7) 107.2 (1.1)   Urine (mL/kg/hr) 0 (0)    Emesis/NG output 150    Blood     Chest Tube 640 20   CRRT 835.7 62.2   Total Output 1625.7 82.2   Net +224 +25            _______________________________________________________________ Labs:    Latest Ref Rng & Units 06/24/2023    4:11 AM 06/23/2023    4:19 PM 06/23/2023    8:41 AM  CBC  WBC 4.0 - 10.5 K/uL 10.6  11.8    Hemoglobin 13.0 - 17.0 g/dL 7.4  7.9  82.9   Hematocrit 39.0 - 52.0 % 22.7  24.7  30.0   Platelets 150 - 400 K/uL 109  104        Latest Ref Rng & Units 06/24/2023    4:11 AM 06/23/2023    4:19 PM 06/23/2023    8:41 AM  CMP  Glucose 70 - 99 mg/dL 562  130    865    BUN 6 - 20 mg/dL 33  48    50    Creatinine 0.61 - 1.24 mg/dL 7.84  6.96    2.95    Sodium 135 - 145 mmol/L 134  136    137  139   Potassium 3.5 - 5.1 mmol/L 3.9  5.1    5.1  5.8   Chloride 98 - 111 mmol/L 100  102    106    CO2 22 - 32 mmol/L 21  19    19     Calcium 8.9 - 10.3 mg/dL 7.8  7.7    7.9      CXR: -  _______________________________________________________________  Assessment and Plan: POD 2 s/p MVR, TVR, AVR.  In heart block  Neuro: on precedex CV: weaning gtts.  Will work on levo today for SBP >100 Pulm: wean to extubated Renal: on CRRT GI: will need nutrition today.  ND tube if not extaubted Heme: stable ID: [email or web address] Endo: SSI Dispo: continue ICU care   [name] 06/24/2023 9:27 AM

## 2023-06-25 ENCOUNTER — Inpatient Hospital Stay (HOSPITAL_COMMUNITY): Payer: Medicare HMO

## 2023-06-25 DIAGNOSIS — Z9889 Other specified postprocedural states: Secondary | ICD-10-CM | POA: Diagnosis not present

## 2023-06-25 LAB — CBC
HCT: 20.7 % — ABNORMAL LOW (ref 39.0–52.0)
Hemoglobin: 6.4 g/dL — CL (ref 13.0–17.0)
MCH: 32.8 pg (ref 26.0–34.0)
MCHC: 30.9 g/dL (ref 30.0–36.0)
MCV: 106.2 fL — ABNORMAL HIGH (ref 80.0–100.0)
Platelets: 100 10*3/uL — ABNORMAL LOW (ref 150–400)
RBC: 1.95 MIL/uL — ABNORMAL LOW (ref 4.22–5.81)
RDW: 15.3 % (ref 11.5–15.5)
WBC: 10.8 10*3/uL — ABNORMAL HIGH (ref 4.0–10.5)
nRBC: 0.9 % — ABNORMAL HIGH (ref 0.0–0.2)

## 2023-06-25 LAB — RENAL FUNCTION PANEL
Albumin: 3 g/dL — ABNORMAL LOW (ref 3.5–5.0)
Albumin: 2.9 g/dL — ABNORMAL LOW (ref 3.5–5.0)
Anion gap: 10 (ref 5–15)
Anion gap: 8 (ref 5–15)
BUN: 23 mg/dL — ABNORMAL HIGH (ref 6–20)
BUN: 20 mg/dL (ref 6–20)
CO2: 22 mmol/L (ref 22–32)
CO2: 23 mmol/L (ref 22–32)
Calcium: 8.2 mg/dL — ABNORMAL LOW (ref 8.9–10.3)
Calcium: 8.6 mg/dL — ABNORMAL LOW (ref 8.9–10.3)
Chloride: 101 mmol/L (ref 98–111)
Chloride: 102 mmol/L (ref 98–111)
Creatinine, Ser: 4.08 mg/dL — ABNORMAL HIGH (ref 0.61–1.24)
Creatinine, Ser: 3.28 mg/dL — ABNORMAL HIGH (ref 0.61–1.24)
GFR, Estimated: 17 mL/min — ABNORMAL LOW (ref >60)
GFR, Estimated: 22 mL/min — ABNORMAL LOW (ref >60)
Glucose, Bld: 107 mg/dL — ABNORMAL HIGH (ref 70–99)
Glucose, Bld: 129 mg/dL — ABNORMAL HIGH (ref 70–99)
Phosphorus: 4.8 mg/dL — ABNORMAL HIGH (ref 2.5–4.6)
Phosphorus: 4.4 mg/dL (ref 2.5–4.6)
Potassium: 4.9 mmol/L (ref 3.5–5.1)
Potassium: 4.9 mmol/L (ref 3.5–5.1)
Sodium: 133 mmol/L — ABNORMAL LOW (ref 135–145)
Sodium: 133 mmol/L — ABNORMAL LOW (ref 135–145)

## 2023-06-25 LAB — GLUCOSE, CAPILLARY
Glucose-Capillary: 106 mg/dL — ABNORMAL HIGH (ref 70–99)
Glucose-Capillary: 106 mg/dL — ABNORMAL HIGH (ref 70–99)
Glucose-Capillary: 105 mg/dL — ABNORMAL HIGH (ref 70–99)
Glucose-Capillary: 117 mg/dL — ABNORMAL HIGH (ref 70–99)

## 2023-06-25 LAB — HEMOGLOBIN AND HEMATOCRIT, BLOOD
HCT: 24.5 % — ABNORMAL LOW (ref 39.0–52.0)
HCT: 23.6 % — ABNORMAL LOW (ref 39.0–52.0)
Hemoglobin: 7.6 g/dL — ABNORMAL LOW (ref 13.0–17.0)
Hemoglobin: 7.5 g/dL — ABNORMAL LOW (ref 13.0–17.0)

## 2023-06-25 LAB — MAGNESIUM: Magnesium: 2.3 mg/dL (ref 1.7–2.4)

## 2023-06-25 LAB — PREPARE RBC (CROSSMATCH)

## 2023-06-25 MED ORDER — DOCUSATE SODIUM 50 MG/5ML PO LIQD
100.0000 mg | Freq: Every day | ORAL | Status: DC
  Administered 2023-06-25 – 2023-06-29 (×5): 100 mg via ORAL
  Filled 2023-06-25 (×5): qty 10

## 2023-06-25 MED ORDER — ACETAMINOPHEN 500 MG PO TABS
1000.0000 mg | ORAL_TABLET | Freq: Four times a day (QID) | ORAL | Status: AC
  Administered 2023-06-26 – 2023-06-27 (×3): 1000 mg via ORAL
  Filled 2023-06-25 (×3): qty 2

## 2023-06-25 MED ORDER — OMEPRAZOLE 2 MG/ML ORAL SUSPENSION
40.0000 mg | Freq: Two times a day (BID) | ORAL | Status: DC
  Administered 2023-06-25 – 2023-06-29 (×7): 40 mg via ORAL
  Filled 2023-06-25 (×10): qty 20

## 2023-06-25 MED ORDER — OXYCODONE HCL 5 MG/5ML PO SOLN
5.0000 mg | ORAL | Status: DC | PRN
  Administered 2023-06-25: 5 mg via ORAL
  Filled 2023-06-25: qty 10

## 2023-06-25 MED ORDER — ACETAMINOPHEN 160 MG/5ML PO SOLN
1000.0000 mg | Freq: Four times a day (QID) | ORAL | Status: AC
  Administered 2023-06-25 – 2023-06-27 (×5): 1000 mg via ORAL
  Filled 2023-06-25 (×4): qty 40.6

## 2023-06-25 MED ORDER — ACYCLOVIR 200 MG/5ML PO SUSP
200.0000 mg | Freq: Two times a day (BID) | ORAL | Status: DC
  Administered 2023-06-25 – 2023-06-29 (×7): 200 mg via ORAL
  Filled 2023-06-25 (×9): qty 5

## 2023-06-25 MED ORDER — SODIUM CHLORIDE 0.9% IV SOLUTION: Freq: Once | INTRAVENOUS | Status: AC

## 2023-06-25 MED ORDER — ATORVASTATIN CALCIUM 10 MG PO TABS
10.0000 mg | ORAL_TABLET | Freq: Every day | ORAL | Status: DC
  Administered 2023-06-25 – 2023-06-28 (×3): 10 mg via ORAL
  Filled 2023-06-25 (×3): qty 1

## 2023-06-25 NOTE — Evaluation (Signed)
Physical Therapy Evaluation Patient Details Name: [name] MRN: [id number] DOB: 1971-01-04 Today's Date: 06/25/2023  History of Present Illness  Pt is a 52 y.o. male admitted 06/22/23 for same day planned tricuspid valve repair, mitral valve repair, aortic valve replacement, maze, TEE. Post-op course complicated by CHB, shock, AKI, respiratory failure. CRRT initiated 8/16. ETT 8/15-8/17. PMH includes CHF, HTN, NICM, polyneuropathy, ESRD on HD (s/p renal transplant 1999, 2015), bipolar, ADHD, anxiety, depression.   Clinical Impression  Pt presents with an overall decrease in functional mobility secondary to above. PTA, pt independent, lives alone, drives, was working but recently out of work. Initiated educ re: sternal precautions, positioning, activity recommendations, importance of mobility. Today, pt able to stand EOB requiring mod-maxA for mobility; limited by generalized weakness, decreased activity tolerance and impaired balance strategies, as well as sternal precautions and multiple lines (including CRRT). Pt would benefit from post-acute rehab services (>3 hrs/day) to maximize functional mobility and independence prior to return home. Will follow acutely to address established goals.    If plan is discharge home, recommend the following: A lot of help with walking and/or transfers;A lot of help with bathing/dressing/bathroom;Assistance with cooking/housework;Assist for transportation;Help with stairs or ramp for entrance   Can travel by private vehicle    No    Equipment Recommendations Other (comment) (TBD)  Recommendations for Other Services  OT consult;Rehab consult    Functional Status Assessment Patient has had a recent decline in their functional status and demonstrates the ability to make significant improvements in function in a reasonable and predictable amount of time.     Precautions / Restrictions Precautions Precautions: Fall;Other (comment);Sternal Precaution Booklet  Issued: No Precaution Comments: CRRT, external pacer, chest tube, etc.      Mobility  Bed Mobility Overal bed mobility: Needs Assistance Bed Mobility: Supine to Sit, Sit to Supine     Supine to sit: Mod assist, +2 for safety/equipment, HOB elevated Sit to supine: Mod assist, +2 for physical assistance, +2 for safety/equipment   General bed mobility comments: modA for trunk elevation and scooting R hip to EOB while pt opting to hold heart pillow to maintain sternal prec, assist +2 for lines; modA+2 for BLE and trunk management return to supine; maxA+2 to scoot up in bed    Transfers Overall transfer level: Needs assistance Equipment used: None Transfers: Sit to/from Stand Sit to Stand: Mod assist, +2 safety/equipment           General transfer comment: cues for hand placement on knees and use of momentum to power up to standing (bed height slightly elevated), modA for trunk elevation and stability    Ambulation/Gait             Pre-gait activities: pt able to take step with R foot but difficulty accepting full weight onto R knee without brace in order to step with L foot, requesting to sit back down due to pain and fatigue    Stairs            Wheelchair Mobility     Tilt Bed    Modified Rankin (Stroke Patients Only)       Balance Overall balance assessment: Needs assistance Sitting-balance support: No upper extremity supported, Feet supported Sitting balance-Leahy Scale: Fair     Standing balance support: No upper extremity supported, During functional activity Standing balance-Leahy Scale: Poor Standing balance comment: reliant on external assist standing without UE support  Pertinent Vitals/Pain Pain Assessment Pain Assessment: Faces Faces Pain Scale: Hurts little more Pain Location: neck Pain Descriptors / Indicators: Sore, Tiring Pain Intervention(s): Monitored during session, Repositioned    Home  Living Family/patient expects to be discharged to:: Private residence Living Arrangements: Alone Available Help at Discharge: Family;Available 24 hours/day Type of Home: Apartment Home Access: Level entry       Home Layout: One level Home Equipment: Agricultural consultant (2 wheels);Cane - single point Additional Comments: pt lives alone in [place] with friend who could help PRN; parents present and state they hope pt will stay with them at d/c since they're retired (live in [place])    Prior Function Prior Level of Function : Independent/Modified Independent;Working/employed;Driving             Mobility Comments: mod indep with SPC, wears R knee brace for stability; 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; drives ADLs Comments: mod indep     Extremity/Trunk Assessment   Upper Extremity Assessment Upper Extremity Assessment: Overall WFL for tasks assessed (within confines of lines/sternal precautions)    Lower Extremity Assessment Lower Extremity Assessment: Generalized weakness       Communication   Communication Communication: No apparent difficulties  Cognition Arousal: Alert Behavior During Therapy: WFL for tasks assessed/performed Overall Cognitive Status: Within Functional Limits for tasks assessed                                 General Comments: WFL for simple tasks, not formally assessed; answering questions and following simple commands appropriately        General Comments General comments (skin integrity, edema, etc.): pt's parents present. initiated educ re: sternal precautions, activity recommendations, supine BLE AROM, importance of mobility, discharge planning. pt reports interested in post-acute rehab at [facility] if an option    Exercises     Assessment/Plan    PT Assessment Patient needs continued PT services  PT Problem List Decreased strength;Decreased activity tolerance;Decreased balance;Decreased  mobility;Decreased knowledge of use of DME;Decreased knowledge of precautions;Cardiopulmonary status limiting activity;Pain       PT Treatment Interventions DME instruction;Gait training;Stair training;Functional mobility training;Therapeutic activities;Therapeutic exercise;Balance training;Patient/family education    PT Goals (Current goals can be found in the Care Plan section)  Acute Rehab PT Goals Patient Stated Goal: regain independence PT Goal Formulation: With patient Time For Goal Achievement: 07/09/23 Potential to Achieve Goals: Good    Frequency Min 1X/week     Co-evaluation               AM-PAC PT "6 Clicks" Mobility  Outcome Measure Help needed turning from your back to your side while in a flat bed without using bedrails?: A Lot Help needed moving from lying on your back to sitting on the side of a flat bed without using bedrails?: A Lot Help needed moving to and from a bed to a chair (including a wheelchair)?: A Lot Help needed standing up from a chair using your arms (e.g., wheelchair or bedside chair)?: A Lot Help needed to walk in hospital room?: Total Help needed climbing 3-5 steps with a railing? : Total 6 Click Score: 10    End of Session   Activity Tolerance: Patient tolerated treatment well;Patient limited by fatigue Patient left: in bed;with call bell/phone within reach;with nursing/sitter in room;with family/visitor present Nurse Communication: Mobility status PT Visit Diagnosis: Other abnormalities of gait and mobility (R26.89);Muscle weakness (  generalized) (M62.81)    Time: 1610-9604 PT Time Calculation (min) (ACUTE ONLY): 20 min   Charges:   PT Evaluation $PT Eval Moderate Complexity: 1 Mod   PT General Charges $$ ACUTE PT VISIT: 1 Visit        Ina Homes, PT, DPT Acute Rehabilitation Services  Personal: Secure Chat Rehab Office: [phone]  Malachy Chamber 06/25/2023, 4:52 PM

## 2023-06-25 NOTE — Progress Notes (Signed)
[facility] Progress Note  Subjective: seen in ICU, RN updated me on the drips, keeping even w/ CRRT  Vitals:   06/25/23 0700 06/25/23 0800 06/25/23 0900 06/25/23 1000  BP: (!) 122/28 (!) 119/20 (!) 112/29 (!) 118/34  Pulse: 85 84 84 80  Resp: (!) 25 (!) 27 (!) 27 (!) 30  Temp:  98.6 F (37 C)    TempSrc:  Oral    SpO2: 96% 95% 96% 97%  Weight:      Height:        Exam: Gen extubated, alert and conversant No jvd or bruits Chest clear anterior/ lateral RRR no RG Abd soft ntnd no mass or ascites +bs GU normal male MS no joint effusions or deformity Ext  no LE or UE edema       RUE AVF +bruit     OP HD: [place] triad 3h    F250     94kg   2K/2.5 Ca bath   RUE AVF   Assessment/Recommendations: [name] is a/an 52 y.o. male with a past medical history notable for ESRD on HD admitted with MV, AV, and TV issues s/p repair/replacement.    # Tricuspid, aortic, mitral valve insufficiencies - Status post Tri valve repair on 8/15.  Per cardiothoracic surgery  # ESRD - pt was too hypotensive for hemodialysis post-op. Started on CRRT 8/16. Cont CRRT till off of pressors.    # Volume/ shock - pt is close to dry wt, cont to keep even. On exam is euvolemic and before Theone Murdoch was pulled his wedge pressure was 4 per RN. Agree w/ titrating pressors according to SBP as long as mentation is stable.    # Anemia esrd - Hb down to 7s post op due to surgical losses.  Transfusions as needed.     # MBD ckd - Phosphorus 6.1 yesterday.  Continue to monitor.  Calcium 8 but corrects near normal when accounting for albumin.   # Vascular access - RUE AVF functioning well but unable to use due to shock.  Temporary dialysis catheter placed for CRRT.   # Hyperkalemia - resolved, is now on all 4/2.5 bags.    [name] MD  [facility] 06/25/2023, 10:35 AM  Recent Labs  Lab 06/24/23 0411 06/24/23 1559 06/25/23 0112 06/25/23 0115  HGB 7.4*  --  6.4*  --   ALBUMIN 2.4* 2.6*  --  3.0*   CALCIUM 7.8* 8.1*  --  8.2*  PHOS 4.5 5.0*  --  4.8*  CREATININE 5.70* 4.61*  --  4.08*  K 3.9 4.5  --  4.9   No results for input(s): "IRON", "TIBC", "FERRITIN" in the last 168 hours. Inpatient medications:  acetaminophen  1,000 mg Oral Q6H   Or   acetaminophen (TYLENOL) oral liquid 160 mg/5 mL  1,000 mg Per Tube Q6H   acyclovir  200 mg Per Tube BID   atorvastatin  10 mg Per Tube QHS   bisacodyl  10 mg Oral Daily   Or   bisacodyl  10 mg Rectal Daily   Chlorhexidine Gluconate Cloth  6 each Topical Daily   docusate sodium  200 mg Oral Daily   insulin aspart  0-24 Units Subcutaneous Q4H   lidocaine-prilocaine  1 Application Topical Q M,W,F   pantoprazole (PROTONIX) IV  40 mg Intravenous Q12H   sodium chloride flush  3 mL Intravenous Q12H     prismasol BGK 4/2.5 400 mL/hr at 06/25/23 0600    prismasol BGK  4/2.5 400 mL/hr at 06/25/23 0232   sodium chloride Stopped (06/23/23 1106)   sodium chloride     sodium chloride 10 mL/hr at 06/25/23 1000   albumin human Stopped (06/23/23 0040)   dexmedetomidine (PRECEDEX) IV infusion Stopped (06/24/23 0941)   epinephrine 13 mcg/min (06/25/23 1000)   lactated ringers     lactated ringers     nitroGLYCERIN     norepinephrine (LEVOPHED) Adult infusion 12 mcg/min (06/25/23 1000)   phenylephrine (NEO-SYNEPHRINE) Adult infusion 0 mcg/min (06/22/23 1537)   prismasol BGK 4/2.5 1,500 mL/hr at 06/25/23 0843   vasopressin 0.04 Units/min (06/25/23 1000)   sodium chloride, albumin human, clonazePAM, heparin, metoprolol tartrate, morphine injection, ondansetron (ZOFRAN) IV, mouth rinse, oxyCODONE, phenol, sodium chloride, sodium chloride flush, traMADol

## 2023-06-25 NOTE — Progress Notes (Signed)
NAME:  [name], MRN:  [id number], DOB:  1971/09/03, LOS: 3 ADMISSION DATE:  06/22/2023, CONSULTATION DATE: 06/22/2023 REFERRING MD: Leafy Ro - TCTS, CHIEF COMPLAINT: Postcardiac surgery  History of Present Illness:  This is a 52 year old gentleman, past medical history of anxiety depression, bipolar disease, congestive heart failure chronic diastolic heart failure kidney disease followed by renal transplant in 07-08-98 repeat renal transplant in 07/08/2014.  Patient underwent surgery today for severe mitral regurgitation severe tricuspid regurgitation and moderate aortic stenosis.  He also has atrial fibrillation received maze procedure.  He had a complex mitral valve repair a tricuspid valve annuloplasty and aortic valve replacement followed by full right and left maze procedure.  Pertinent Medical History:   Past Medical History:  Diagnosis Date   ADHD (attention deficit hyperactivity disorder)    Anemia    Anemia, chronic disease 07/13/2011   Anxiety    Arthritis    Bipolar 2 disorder (HCC) 08/18/2011   Diagnosed in 2010-07-08.    Bipolar disorder (HCC)    Blood dyscrasia    Nephrotic Anemia   Blood transfusion    Blood transfusion without reported diagnosis    Chronic diastolic CHF (congestive heart failure) (HCC)    Complication of anesthesia    Woke up intubated gets combative  afraid to be alone    Congestive heart failure (CHF) (HCC) 07/13/2011   Onset 07/08/05.  Followed by Dr. Eden Emms.  S/p cardiac catheterization in [facility] Dr. Anne Fu..  S/p cardiac catheterization in July 08, 2013 by Nishan.  Echo July 08, 2013.    Depression    Dysrhythmia    A. Fib July 09, 2023 with DCCV in May 2024   Erectile dysfunction    ESRD on hemodialysis (HCC) 07/13/2011   Glomerulonephritis at age 74, started HD in Jul 09, 1995.  Deceased donor renal transplant 07-08-1998 at [facility], then kidney failed and went back on HD in 2005/07/08.  L forearm AVF . s/p repeat renal transplant 10/2014 [facility].   GI bleed 11/01/2011   Rectal bleeding with  emesis/diarrhea    Heart murmur    Hemodialysis status (HCC)    Hydrocele, right 11/25/2014   Hypertension    Hypertensive emergency 01/06/2013   Hypoglycemia 01/11/2013   Influenza-like illness 12/18/2013   Mild ascending aorta dilatation (HCC)    NICM (nonischemic cardiomyopathy) (HCC)    Pericardial effusion    a. Mod by echo in 2013-07-08, similar to prior.   Peripheral polyneuropathy 08/04/2016   Pneumonia    Pulmonary edema 01/06/2013   Pulmonary hypertension associated with end stage renal disease on dialysis [facility]) 07/13/2011   Renal transplant, status post 11/25/2014   Pt with Glomerulonephritis at age 16. Deceased donor renal transplant 07/08/1998 right and left renal transplant 10/2014.  Followed by [facility] transplant team; nephrologist is Dr. Lonna Cobb.    Respiratory failure with hypoxia (HCC) 01/06/2013   Shortness of breath 12/12/2012   Swollen testicle 07/13/2011   URI (upper respiratory infection) 01/25/2012    Significant Hospital Events: Including procedures, antibiotic start and stop dates in addition to other pertinent events   8/15 - AVR, MVR, TVR with TCTS ([place]). Remains on multiple pressors (NE, Epi, vaso). 8/16 - LIJ Trialysis catheter placed for CRRT.  8/17 extubated  8/18 - remains on CVVHD, 3 pressors   Interim History / Subjective:   Alert following commands. Was given PRBCs overnight. Recheck H&H pending. No active signs of bleeding   Objective:  Blood pressure (!) 119/20, pulse 84, temperature 98.4 F (36.9 C), temperature source Oral, resp. rate Marland Kitchen)  27, height 6\' 3"  (1.905 m), weight 96.3 kg, SpO2 95%. CVP:  [5 mmHg-9 mmHg] 5 mmHg  Vent Mode: PSV;CPAP FiO2 (%):  [36 %-40 %] 36 % Pressure Support:  [5 cmH20] 5 cmH20   Intake/Output Summary (Last 24 hours) at 06/25/2023 0843 Last data filed at 06/25/2023 0800 Gross per 24 hour  Intake 3998.18 ml  Output 1521.52 ml  Net 2476.66 ml   Filed Weights   06/23/23 0500 06/24/23 0500 06/25/23 0600   Weight: 93.5 kg 93.7 kg 96.3 kg   Physical Examination: General: critically ill, middle aged male, on pressors and cvvhd  HEENT: internal jugular HD cath Neuro: alert following commands  CV: RRR, s1 s2  PULM: CTAB, no crackles no wheeze  GI: soft, nt nd  Extremities: no edema   Resolved Hospital Problem List:     Assessment & Plan:   Mitral valve regurgitation, tricuspid regurgitation and aortic stenosis status post valvular repair x 3 - Postoperative management per TCTS - CT management per TCTS - Multimodal pain control with PAD protocol (while vented), oxycodone, morphine (caution given CKD/renal failure), tramadol; maximize adjuncts as able (APAP/lido patches)  Postoperative shock and vasoplegia - still on multiple pressors  - was given albumin and PRBCs yesterday  - pressors are stable at this time  - recheck H&H this afternoon   Postoperative respiratory failure - extubated, needs IS flutter and mobility - limited at this time due to other icu needs, on pressors and cvvhd  - HOB elevated   Bloody OG output, OGT removed after extubation  ?UGIB  Question injury from TEE probe. - continue PPI  - no other signs of active bleeding   ABLA, postoperative as expected Thrombocytopenia, ?chronic - recheck H&H at noon   Acute renal failure in the setting of hemodynamic insults on CKD History of kidney disease status post renal transplant - CVVHD per nephro - having to run even right now due to pressures.   Best Practice (right click and "Reselect all SmartList Selections" daily)   Diet/type: NPO DVT prophylaxis: prophylactic heparin  GI prophylaxis: PPI Lines: Central line Foley:  Yes, and it is still needed Code Status:  full code Last date of multidisciplinary goals of care discussion [Per Primary Team]  Critical care time:   This patient is critically ill with multiple organ system failure; which, requires frequent high complexity decision making, assessment,  support, evaluation, and titration of therapies. This was completed through the application of advanced monitoring technologies and extensive interpretation of multiple databases. During this encounter critical care time was devoted to patient care services described in this note for 32 minutes.  Josephine Igo, DO [facility] 06/25/2023 8:43 AM

## 2023-06-25 NOTE — Progress Notes (Signed)
Patient Profile: [name] is a 52 y.o. male with a history of Anxiety, depression, Bipolar disease, CHF, CKD s/p renal transplant 07/13/1998 -> redo 2014-07-13 -> ESRD/HD, and severe MR/severe TR/Moderate AS seen t or the evaluation of heart block s/p cardiac surgery at the request of Dr. Leafy Ro. Postoperative course also complicated by respiratory failure and prolonged intubation, shock requiring multiple pressors with decreasing doses over the last 24 hours  Patient Name: [name]      SUBJECTIVE***   Past Medical History:  Diagnosis Date   ADHD (attention deficit hyperactivity disorder)    Anemia    Anemia, chronic disease 07/13/2011   Anxiety    Arthritis    Bipolar 2 disorder (HCC) 08/18/2011   Diagnosed in July 13, 2010.    Bipolar disorder (HCC)    Blood dyscrasia    Nephrotic Anemia   Blood transfusion    Blood transfusion without reported diagnosis    Chronic diastolic CHF (congestive heart failure) (HCC)    Complication of anesthesia    Woke up intubated gets combative  afraid to be alone    Congestive heart failure (CHF) (HCC) 07/13/2011   Onset 2005-07-13.  Followed by Dr. Eden Emms.  S/p cardiac catheterization in [facility] Dr. Anne Fu..  S/p cardiac catheterization in 07/13/2013 by Nishan.  Echo 07-13-2013.    Depression    Dysrhythmia    A. Fib 2023/07/14 with DCCV in May 2024   Erectile dysfunction    ESRD on hemodialysis (HCC) 07/13/2011   Glomerulonephritis at age 76, started HD in July 14, 1995.  Deceased donor renal transplant 1998-07-13 at [facility], then kidney failed and went back on HD in 13-Jul-2005.  L forearm AVF . s/p repeat renal transplant 10/2014 [facility].   GI bleed 11/01/2011   Rectal bleeding with emesis/diarrhea    Heart murmur    Hemodialysis status (HCC)    Hydrocele, right 11/25/2014   Hypertension    Hypertensive emergency 01/06/2013   Hypoglycemia 01/11/2013   Influenza-like illness 12/18/2013   Mild ascending aorta dilatation (HCC)    NICM (nonischemic cardiomyopathy) (HCC)    Pericardial  effusion    a. Mod by echo in 07-13-13, similar to prior.   Peripheral polyneuropathy 08/04/2016   Pneumonia    Pulmonary edema 01/06/2013   Pulmonary hypertension associated with end stage renal disease on dialysis [facility]) 07/13/2011   Renal transplant, status post 11/25/2014   Pt with Glomerulonephritis at age 87. Deceased donor renal transplant 1998-07-13 right and left renal transplant 10/2014.  Followed by [facility] transplant team; nephrologist is Dr. Lonna Cobb.    Respiratory failure with hypoxia (HCC) 01/06/2013   Shortness of breath 12/12/2012   Swollen testicle 07/13/2011   URI (upper respiratory infection) 01/25/2012    Scheduled Meds:  Scheduled Meds:  acetaminophen  1,000 mg Oral Q6H   Or   acetaminophen (TYLENOL) oral liquid 160 mg/5 mL  1,000 mg Per Tube Q6H   acyclovir  200 mg Per Tube BID   atorvastatin  10 mg Per Tube QHS   bisacodyl  10 mg Oral Daily   Or   bisacodyl  10 mg Rectal Daily   Chlorhexidine Gluconate Cloth  6 each Topical Daily   docusate sodium  200 mg Oral Daily   insulin aspart  0-24 Units Subcutaneous Q4H   lidocaine-prilocaine  1 Application Topical Q M,W,F   pantoprazole (PROTONIX) IV  40 mg Intravenous Q12H   sodium chloride flush  3 mL Intravenous Q12H   Continuous Infusions:   prismasol  BGK 4/2.5 400 mL/hr at 06/25/23 0600    prismasol BGK 4/2.5 400 mL/hr at 06/25/23 0232   sodium chloride Stopped (06/23/23 1106)   sodium chloride     sodium chloride 20 mL/hr at 06/25/23 0800   albumin human Stopped (06/23/23 0040)   dexmedetomidine (PRECEDEX) IV infusion Stopped (06/24/23 0941)   epinephrine 13 mcg/min (06/25/23 0800)   lactated ringers     lactated ringers     nitroGLYCERIN     norepinephrine (LEVOPHED) Adult infusion 15 mcg/min (06/25/23 0800)   phenylephrine (NEO-SYNEPHRINE) Adult infusion 0 mcg/min (06/22/23 1537)   prismasol BGK 4/2.5 1,500 mL/hr at 06/25/23 0600   vasopressin 0.04 Units/min (06/25/23 0832)   sodium chloride,  albumin human, clonazePAM, heparin, metoprolol tartrate, morphine injection, ondansetron (ZOFRAN) IV, mouth rinse, oxyCODONE, phenol, sodium chloride, sodium chloride flush, traMADol    PHYSICAL EXAM Vitals:   06/25/23 0615 06/25/23 0630 06/25/23 0700 06/25/23 0800  BP:  (!) 103/44 (!) 122/28 (!) 119/20  Pulse: 84 84 85 84  Resp: (!) 28 (!) 30 (!) 25 (!) 27  Temp:      TempSrc:      SpO2: 95% 95% 96% 95%  Weight:      Height:       Well developed and nourished in no acute distress HENT normal Neck supple with JVP-  flat *** Clear Regular rate and rhythm, no murmurs or gallops Abd-soft with active BS No Clubbing cyanosis edema Skin-warm and dry A & Oriented  Grossly normal sensory and motor function  ECG ***    TELEMETRY: Reviewed personnally pt with Vpacing  ***  ECG personally reviewed   Intake/Output Summary (Last 24 hours) at 06/25/2023 0838 Last data filed at 06/25/2023 0800 Gross per 24 hour  Intake 3998.18 ml  Output 1521.52 ml  Net 2476.66 ml    LABS: Basic Metabolic Panel: Recent Labs  Lab 06/22/23 1618 06/22/23 1620 06/22/23 2147 06/22/23 2147 06/23/23 0301 06/23/23 0402 06/23/23 0841 06/23/23 1619 06/24/23 0411 06/24/23 1559 06/25/23 0115  NA 143   < > 137  --  141 139 139 136  137 134* 136 133*  K 3.8   < > 4.6  --  5.6* 5.5* 5.8* 5.1  5.1 3.9 4.5 4.9  CL 109  --  106  --   --  107  --  102  106 100 104 101  CO2 20*  --  18*  --   --  20*  --  19*  19* 21* 23 22  GLUCOSE 30*  --  132*  --   --  90  --  103*  103* 120* 107* 107*  BUN 46*  --  47*  --   --  52*  --  48*  50* 33* 28* 23*  CREATININE 8.62*  --  8.73*  --   --  9.23*  --  8.16*  8.01* 5.70* 4.61* 4.08*  CALCIUM 7.5*  --  7.5*  --   --  8.0*  --  7.7*  7.9* 7.8* 8.1* 8.2*  MG 2.2  --  1.9  --   --  1.9  --  2.1 2.1  --  2.3  PHOS  --   --   --    < >  --  6.1*  --  5.8* 4.5 5.0* 4.8*   < > = values in this interval not displayed.   Cardiac Enzymes: No results for  input(s): "CKTOTAL", "CKMB", "CKMBINDEX", "TROPONINI" in the last  72 hours. CBC: Recent Labs  Lab 06/20/23 1130 06/22/23 0648 06/22/23 1232 06/22/23 1250 06/22/23 1618 06/22/23 1620 06/22/23 2147 06/23/23 0301 06/23/23 0402 06/23/23 0841 06/23/23 1619 06/24/23 0411 06/25/23 0112  WBC 2.2*  --   --   --  8.5  --  8.5  --  10.5  --  11.8* 10.6* 10.8*  HGB 11.5*   < > 8.1*   < > 9.2*   < > 8.6* 9.2* 8.2* 10.2* 7.9* 7.4* 6.4*  HCT 36.0*   < > 26.0*   < > 29.1*   < > 27.1* 27.0* 25.9* 30.0* 24.7* 22.7* 20.7*  MCV 102.0*  --   --   --  103.2*  --  103.0*  --  102.0*  --  101.6* 101.3* 106.2*  PLT 100*  --  102*  --  88*  --  82*  --  102*  --  104* 109* 100*   < > = values in this interval not displayed.   PROTIME: Recent Labs    06/22/23 1618  LABPROT 29.3*  INR 2.8*   Liver Function Tests: Recent Labs    06/24/23 1559 06/25/23 0115  ALBUMIN 2.6* 3.0*       Device Interrogation: (As above)    ASSESSMENT AND PLAN:  Complete Heart block  S/p MVR/AVR/TVR/MAZE/LAAL  ESRD on RRT (prior Tx 1999/2015  Underlying junctional rhyhtm  hopefully will continue to recover sinus node fyunction  which was absent this am *** Signed, Sherryl Manges MD  06/25/2023

## 2023-06-25 NOTE — Progress Notes (Signed)
[address]       [place] [id number]             [phone]                 3 Days Post-Op Procedure(s) (LRB): TRICUSPID VALVE REPAIR MC3 Ring size 30 (N/A) MITRAL VALVE REPAIR (MVR) utilizing Simulis Band size 30mm (N/A) AORTIC VALVE REPLACEMENT (AVR) inspiris valve size 27 (N/A) MAZE (N/A) TRANSESOPHAGEAL ECHOCARDIOGRAM (N/A)   Events: No events Extubated yesterday _______________________________________________________________ Vitals: BP (!) 112/29   Pulse 84   Temp 98.4 F (36.9 C) (Oral)   Resp (!) 27   Ht 6\' 3"  (1.905 m)   Wt 96.3 kg   SpO2 96%   BMI 26.54 kg/m  Filed Weights   06/23/23 0500 06/24/23 0500 06/25/23 0600  Weight: 93.5 kg 93.7 kg 96.3 kg     - Neuro: aalert NAD  - Cardiovascular: paced  Drips: epi13, levo 12, vaso 0.04.   CVP:  [5 mmHg] 5 mmHg  - Pulm:  EWOB  ABG    Component Value Date/Time   PHART 7.300 (L) 06/23/2023 0841   PCO2ART 40.0 06/23/2023 0841   PO2ART 133 (H) 06/23/2023 0841   HCO3 19.7 (L) 06/23/2023 0841   TCO2 21 (L) 06/23/2023 0841   ACIDBASEDEF 6.0 (H) 06/23/2023 0841   O2SAT 99 06/23/2023 0841    - Abd: ND - Extremity: warm  .Intake/Output      08/17 0701 08/18 0700 08/18 0701 08/19 0700   P.O. 150    I.V. (mL/kg) 1824.8 (18.9) 164.9 (1.7)   Blood 785    NG/GT     IV Piggyback 1205.7    Total Intake(mL/kg) 3965.5 (41.2) 164.9 (1.7)   Urine (mL/kg/hr)     Emesis/NG output     Chest Tube 300 40   CRRT 1121.7 177.2   Total Output 1421.7 217.2   Net +2543.7 -52.3           _______________________________________________________________ Labs:    Latest Ref Rng & Units 06/25/2023    1:12 AM 06/24/2023    4:11 AM 06/23/2023    4:19 PM  CBC  WBC 4.0 - 10.5 K/uL 10.8  10.6  11.8   Hemoglobin 13.0 - 17.0 g/dL 6.4  7.4  7.9   Hematocrit 39.0 - 52.0 % 20.7  22.7  24.7   Platelets 150 - 400 K/uL 100  109  104       Latest Ref Rng & Units 06/25/2023    1:15 AM 06/24/2023     3:59 PM 06/24/2023    4:11 AM  CMP  Glucose 70 - 99 mg/dL 914  782  956   BUN 6 - 20 mg/dL 23  28  33   Creatinine 0.61 - 1.24 mg/dL 2.13  0.86  5.78   Sodium 135 - 145 mmol/L 133  136  134   Potassium 3.5 - 5.1 mmol/L 4.9  4.5  3.9   Chloride 98 - 111 mmol/L 101  104  100   CO2 22 - 32 mmol/L 22  23  21    Calcium 8.9 - 10.3 mg/dL 8.2  8.1  7.8     CXR: -  _______________________________________________________________  Assessment and Plan: POD 3 s/p MVR, TVR, AVR.  In heart block  Neuro: on precedex CV: weaning gtts.  Will work on levo today for SBP >100 Pulm: IS, pulm hygiene Renal: on CRRT GI: on diet.  Keep on clear  while on high dose pressors Heme: stable ID: [email or web address] Endo: SSI Dispo: continue ICU care   Jovontae Banko O Shanti Agresti 06/25/2023 10:00 AM

## 2023-06-25 NOTE — Plan of Care (Signed)
  Problem: Respiratory: Goal: Respiratory status will improve Outcome: Progressing   Problem: Cardiac: Goal: Will achieve and/or maintain hemodynamic stability Outcome: Progressing   Problem: Activity: Goal: Risk for activity intolerance will decrease Outcome: Progressing   Problem: Urinary Elimination: Goal: Ability to achieve and maintain adequate renal perfusion and functioning will improve Outcome: Progressing   Problem: Skin Integrity: Goal: Risk for impaired skin integrity will decrease Outcome: Progressing   Problem: Education: Goal: Will demonstrate proper wound care and an understanding of methods to prevent future damage Outcome: Progressing Goal: Knowledge of the prescribed therapeutic regimen will improve Outcome: Progressing

## 2023-06-25 NOTE — Progress Notes (Signed)
Inpatient Rehab Admissions Coordinator:   Per therapy recommendations,  patient was screened for CIR candidacy by Megan Salon, MS, CCC-SLP  At this time, Pt. is not medically ready for CIR, as he remains on CRRT. I will not pursue a rehab consult for this Pt. at this time, but CIR admissions team will follow and monitor for medical readiness and place consult order if Pt. appears to be an appropriate candidate. Please contact me with any questions.   Megan Salon, MS, CCC-SLP Rehab Admissions Coordinator  [phone] (celll) [phone] (office)

## 2023-06-26 ENCOUNTER — Inpatient Hospital Stay (HOSPITAL_COMMUNITY): Payer: Medicare HMO

## 2023-06-26 ENCOUNTER — Encounter (HOSPITAL_COMMUNITY): Payer: Self-pay | Admitting: Thoracic Surgery (Cardiothoracic Vascular Surgery)

## 2023-06-26 DIAGNOSIS — Z9889 Other specified postprocedural states: Secondary | ICD-10-CM | POA: Diagnosis not present

## 2023-06-26 DIAGNOSIS — I442 Atrioventricular block, complete: Secondary | ICD-10-CM | POA: Diagnosis not present

## 2023-06-26 LAB — POCT I-STAT 7, (LYTES, BLD GAS, ICA,H+H)
Acid-base deficit: 2 mmol/L (ref 0.0–2.0)
Bicarbonate: 26 mmol/L (ref 20.0–28.0)
Calcium, Ion: 1.27 mmol/L (ref 1.15–1.40)
HCT: 29 % — ABNORMAL LOW (ref 39.0–52.0)
Hemoglobin: 9.9 g/dL — ABNORMAL LOW (ref 13.0–17.0)
O2 Saturation: 100 %
Patient temperature: 99.1
Potassium: 4.7 mmol/L (ref 3.5–5.1)
Sodium: 136 mmol/L (ref 135–145)
TCO2: 28 mmol/L (ref 22–32)
pCO2 arterial: 64.1 mmHg — ABNORMAL HIGH (ref 32–48)
pH, Arterial: 7.217 — ABNORMAL LOW (ref 7.35–7.45)
pO2, Arterial: 222 mmHg — ABNORMAL HIGH (ref 83–108)

## 2023-06-26 LAB — RENAL FUNCTION PANEL
Albumin: 2.9 g/dL — ABNORMAL LOW (ref 3.5–5.0)
Albumin: 3 g/dL — ABNORMAL LOW (ref 3.5–5.0)
Anion gap: 9 (ref 5–15)
Anion gap: 13 (ref 5–15)
BUN: 18 mg/dL (ref 6–20)
BUN: 16 mg/dL (ref 6–20)
CO2: 23 mmol/L (ref 22–32)
CO2: 23 mmol/L (ref 22–32)
Calcium: 8.6 mg/dL — ABNORMAL LOW (ref 8.9–10.3)
Calcium: 8.9 mg/dL (ref 8.9–10.3)
Chloride: 102 mmol/L (ref 98–111)
Chloride: 99 mmol/L (ref 98–111)
Creatinine, Ser: 3.19 mg/dL — ABNORMAL HIGH (ref 0.61–1.24)
Creatinine, Ser: 2.82 mg/dL — ABNORMAL HIGH (ref 0.61–1.24)
GFR, Estimated: 23 mL/min — ABNORMAL LOW (ref >60)
GFR, Estimated: 26 mL/min — ABNORMAL LOW (ref >60)
Glucose, Bld: 127 mg/dL — ABNORMAL HIGH (ref 70–99)
Glucose, Bld: 108 mg/dL — ABNORMAL HIGH (ref 70–99)
Phosphorus: 3.9 mg/dL (ref 2.5–4.6)
Phosphorus: 3.7 mg/dL (ref 2.5–4.6)
Potassium: 4.7 mmol/L (ref 3.5–5.1)
Potassium: 4.7 mmol/L (ref 3.5–5.1)
Sodium: 134 mmol/L — ABNORMAL LOW (ref 135–145)
Sodium: 135 mmol/L (ref 135–145)

## 2023-06-26 LAB — GLUCOSE, CAPILLARY
Glucose-Capillary: 106 mg/dL — ABNORMAL HIGH (ref 70–99)
Glucose-Capillary: 119 mg/dL — ABNORMAL HIGH (ref 70–99)
Glucose-Capillary: 122 mg/dL — ABNORMAL HIGH (ref 70–99)
Glucose-Capillary: 129 mg/dL — ABNORMAL HIGH (ref 70–99)
Glucose-Capillary: 130 mg/dL — ABNORMAL HIGH (ref 70–99)
Glucose-Capillary: 98 mg/dL (ref 70–99)
Glucose-Capillary: 99 mg/dL (ref 70–99)

## 2023-06-26 LAB — PREPARE RBC (CROSSMATCH)

## 2023-06-26 LAB — CBC
HCT: 24.2 % — ABNORMAL LOW (ref 39.0–52.0)
HCT: 28.4 % — ABNORMAL LOW (ref 39.0–52.0)
Hemoglobin: 7.4 g/dL — ABNORMAL LOW (ref 13.0–17.0)
Hemoglobin: 8.8 g/dL — ABNORMAL LOW (ref 13.0–17.0)
MCH: 31.4 pg (ref 26.0–34.0)
MCH: 30.9 pg (ref 26.0–34.0)
MCHC: 30.6 g/dL (ref 30.0–36.0)
MCHC: 31 g/dL (ref 30.0–36.0)
MCV: 102.5 fL — ABNORMAL HIGH (ref 80.0–100.0)
MCV: 99.6 fL (ref 80.0–100.0)
Platelets: 101 10*3/uL — ABNORMAL LOW (ref 150–400)
Platelets: 96 10*3/uL — ABNORMAL LOW (ref 150–400)
RBC: 2.36 MIL/uL — ABNORMAL LOW (ref 4.22–5.81)
RBC: 2.85 MIL/uL — ABNORMAL LOW (ref 4.22–5.81)
RDW: 17.8 % — ABNORMAL HIGH (ref 11.5–15.5)
RDW: 19.1 % — ABNORMAL HIGH (ref 11.5–15.5)
WBC: 7.6 10*3/uL (ref 4.0–10.5)
WBC: 8.1 10*3/uL (ref 4.0–10.5)
nRBC: 3 % — ABNORMAL HIGH (ref 0.0–0.2)
nRBC: 4.2 % — ABNORMAL HIGH (ref 0.0–0.2)

## 2023-06-26 LAB — IRON AND TIBC
Iron: 121 ug/dL (ref 45–182)
Saturation Ratios: 91 % — ABNORMAL HIGH (ref 17.9–39.5)
TIBC: 133 ug/dL — ABNORMAL LOW (ref 250–450)
UIBC: 12 ug/dL

## 2023-06-26 LAB — MAGNESIUM: Magnesium: 2.4 mg/dL (ref 1.7–2.4)

## 2023-06-26 LAB — FOLATE: Folate: 3.6 ng/mL — ABNORMAL LOW (ref >5.9)

## 2023-06-26 LAB — FERRITIN: Ferritin: 1444 ng/mL — ABNORMAL HIGH (ref 24–336)

## 2023-06-26 LAB — VITAMIN B12: Vitamin B-12: 1175 pg/mL — ABNORMAL HIGH (ref 180–914)

## 2023-06-26 MED ORDER — HYDROMORPHONE HCL 1 MG/ML IJ SOLN: 0.5000 mg | INTRAMUSCULAR | Status: DC | PRN

## 2023-06-26 MED ORDER — SODIUM CHLORIDE 0.9% IV SOLUTION: Freq: Once | INTRAVENOUS | Status: DC

## 2023-06-26 MED ORDER — MIDAZOLAM HCL 2 MG/2ML IJ SOLN
INTRAMUSCULAR | Status: AC
  Filled 2023-06-26: qty 2

## 2023-06-26 MED ORDER — OXYCODONE HCL 5 MG/5ML PO SOLN
5.0000 mg | ORAL | Status: DC | PRN
  Administered 2023-06-27: 5 mg via ORAL
  Filled 2023-06-26: qty 5

## 2023-06-26 NOTE — Progress Notes (Signed)
Breathing status a bit tenuous, placing on BIPAP and a pigtail on R to help drain out effusion on [place].  Low threshold for reintubation, family updated.  Myrla Halsted MD PCCM

## 2023-06-26 NOTE — Procedures (Signed)
Insertion of Chest Tube Procedure Note  [name]  [id number]  06-06-1971  Date:06/26/23  Time:6:20 PM    Provider Performing: Lorin Glass   Procedure: Pleural Catheter Insertion w/ Imaging Guidance (29528)  Indication(s) Effusion  Consent Emergent  Anesthesia Topical only with 1% lidocaine    Time Out Verified patient identification, verified procedure, site/side was marked, verified correct patient position, special equipment/implants available, medications/allergies/relevant history reviewed, required imaging and test results available.   Sterile Technique Maximal sterile technique including full sterile barrier drape, hand hygiene, sterile gown, sterile gloves, mask, hair covering, sterile ultrasound probe cover (if used).   Procedure Description Ultrasound used to identify appropriate pleural anatomy for placement and overlying skin marked. Area of placement cleaned and draped in sterile fashion.  A 14 French pigtail pleural catheter was placed into the right pleural space using Seldinger technique. Appropriate return of blood was obtained.  The tube was connected to atrium and placed on -20 cm H2O wall suction.   Complications/Tolerance None; patient tolerated the procedure well. Chest X-ray is ordered to verify placement.   EBL Minimal  Specimen(s) none

## 2023-06-26 NOTE — Progress Notes (Signed)
NAME:  [name], MRN:  [id number], DOB:  10/02/1971, LOS: 4 ADMISSION DATE:  06/22/2023, CONSULTATION DATE: 06/22/2023 REFERRING MD: Leafy Ro - TCTS, CHIEF COMPLAINT: Postcardiac surgery  History of Present Illness:  This is a 52 year old gentleman, past medical history of anxiety depression, bipolar disease, congestive heart failure chronic diastolic heart failure kidney disease followed by renal transplant in 07/27/1998 repeat renal transplant in 07-27-14.  Patient underwent surgery today for severe mitral regurgitation severe tricuspid regurgitation and moderate aortic stenosis.  He also has atrial fibrillation received maze procedure.  He had a complex mitral valve repair a tricuspid valve annuloplasty and aortic valve replacement followed by full right and left maze procedure.  Pertinent Medical History:   Past Medical History:  Diagnosis Date   ADHD (attention deficit hyperactivity disorder)    Anemia    Anemia, chronic disease 07/13/2011   Anxiety    Arthritis    Bipolar 2 disorder (HCC) 08/18/2011   Diagnosed in 07/27/10.    Bipolar disorder (HCC)    Blood dyscrasia    Nephrotic Anemia   Blood transfusion    Blood transfusion without reported diagnosis    Chronic diastolic CHF (congestive heart failure) (HCC)    Complication of anesthesia    Woke up intubated gets combative  afraid to be alone    Congestive heart failure (CHF) (HCC) 07/13/2011   Onset 07/27/05.  Followed by Dr. Eden Emms.  S/p cardiac catheterization in [facility] Dr. Anne Fu..  S/p cardiac catheterization in 07/27/13 by Nishan.  Echo July 27, 2013.    Depression    Dysrhythmia    A. Fib 07-28-23 with DCCV in May 2024   Erectile dysfunction    ESRD on hemodialysis (HCC) 07/13/2011   Glomerulonephritis at age 75, started HD in 07-28-1995.  Deceased donor renal transplant 27-Jul-1998 at [facility], then kidney failed and went back on HD in 2005-07-27.  L forearm AVF . s/p repeat renal transplant 10/2014 [facility].   GI bleed 11/01/2011   Rectal bleeding with  emesis/diarrhea    Heart murmur    Hemodialysis status (HCC)    Hydrocele, right 11/25/2014   Hypertension    Hypertensive emergency 01/06/2013   Hypoglycemia 01/11/2013   Influenza-like illness 12/18/2013   Mild ascending aorta dilatation (HCC)    NICM (nonischemic cardiomyopathy) (HCC)    Pericardial effusion    a. Mod by echo in 07/27/2013, similar to prior.   Peripheral polyneuropathy 08/04/2016   Pneumonia    Pulmonary edema 01/06/2013   Pulmonary hypertension associated with end stage renal disease on dialysis [facility]) 07/13/2011   Renal transplant, status post 11/25/2014   Pt with Glomerulonephritis at age 36. Deceased donor renal transplant 07/27/1998 right and left renal transplant 10/2014.  Followed by [facility] transplant team; nephrologist is Dr. Lonna Cobb.    Respiratory failure with hypoxia (HCC) 01/06/2013   Shortness of breath 12/12/2012   Swollen testicle 07/13/2011   URI (upper respiratory infection) 01/25/2012    Significant Hospital Events: Including procedures, antibiotic start and stop dates in addition to other pertinent events   8/15 - AVR, MVR, TVR with TCTS ([place]). Remains on multiple pressors (NE, Epi, vaso). 8/16 - LIJ Trialysis catheter placed for CRRT.  8/17 extubated  8/18 - remains on CVVHD, 3 pressors   Interim History / Subjective:  No events, waking up more. Staying about even on CRRT. Tolerating A-line SBP goal 100 for now Intermittent PO intake Minimal drain output  Objective:  Blood pressure (!) 66/30, pulse 84, temperature 98.2 F (36.8 C),  temperature source Oral, resp. rate (!) 21, height 6\' 3"  (1.905 m), weight 94.6 kg, SpO2 96%.        Intake/Output Summary (Last 24 hours) at 06/26/2023 8119 Last data filed at 06/26/2023 0700 Gross per 24 hour  Intake 1829.37 ml  Output 2155.9 ml  Net -326.53 ml   Filed Weights   06/24/23 0500 06/25/23 0600 06/26/23 0500  Weight: 93.7 kg 96.3 kg 94.6 kg   Physical Examination: No distress resting  in bed Pupils small and equal Moves to command but weak Minimal sanguinous output from tubes Lungs diminished bases, nonlabored pattern Warm ext  Sugars ok Chemistries stable AM H/H okay (macrocytic?) Plts stable low  Patient Lines/Drains/Airways Status     Active Line/Drains/Airways     Name Placement date Placement time Site Days   Arterial Line 06/22/23 Left Brachial 06/22/23  0700  Brachial  4   Peripheral IV 06/22/23 16 G Left Forearm 06/22/23  0645  Forearm  4   Vascular Access Left Forearm Arteriovenous fistula --  --  Forearm  --   Fistula / Graft Right Upper arm Arteriovenous fistula 06/22/23  1900  Upper arm  4   Y Chest Tube 1 and 2 1 Medial Mediastinal 32 Fr. 2 Medial 28 Fr. 06/22/23  1406  -- 4   Incision (Closed) 06/22/23 Chest Other (Comment) 06/22/23  1107  -- 4             Resolved Hospital Problem List:   Bloody OG output, OGT removed after extubation  ?UGIB  Question injury from TEE probe. - continue PPI  - no other signs of active bleeding   Assessment & Plan:   Mitral valve regurgitation, tricuspid regurgitation and aortic stenosis status post valvular repair x 3 - Postoperative management per TCTS - Drain management per TCTS - Multimodal pain control: orders simplified and adjusted  Postoperative shock and vasoplegia - still on multiple pressors : epi 13, levo 6, vaso 0.04; goal SBP >100 - BID CBC checks entered  Postoperative respiratory failure - extubated, push IS while awake - limited at this time due to other icu needs, on pressors and cvvhd  - HOB elevated   ABLA, postoperative as expected Thrombocytopenia, ?chronic Acute renal failure in the setting of hemodynamic insults on CKD History of kidney disease status post renal transplant - CVVHD per nephro - having to run even right now due to BP.  - Check b12, b9  Best Practice (right click and "Reselect all SmartList Selections" daily)   Diet/type: clear liquid DVT  prophylaxis:SCD GI prophylaxis: PPI Lines: Central line Foley:  Yes, and it is still needed Code Status:  full code Last date of multidisciplinary goals of care discussion [Per Primary Team]  35 min cc time  Lorin Glass, MD [facility] 06/26/2023 7:22 AM

## 2023-06-26 NOTE — Progress Notes (Addendum)
[facility] Progress Note  Subjective: seen in ICU. Weaning down on vasopressors per RN. They are supposed to taking out the chest tubes today.   Vitals:   06/26/23 0700 06/26/23 0800 06/26/23 1043 06/26/23 1050  BP: (!) 72/24 (!) 70/29    Pulse: 84 84 84 84  Resp: 20 (!) 26 (!) 22 (!) 21  Temp:  98.2 F (36.8 C) 98.4 F (36.9 C) 98.4 F (36.9 C)  TempSrc:  Oral Oral Oral  SpO2: 95% 96% 97% 97%  Weight:      Height:        Exam: Gen lethargic but arouses and responds appropriately  No jvd or bruits Chest clear anterior/ lateral, bilat CT's RRR no RG Abd soft ntnd no mass or ascites +bs GU normal male MS no joint effusions or deformity Ext  no LE or UE edema       RUE AVF +bruit     OP HD: [facility] 3h    F250     94kg   2K/2.5 Ca bath   RUE AVF   Summary: [name] is a/an 52 y.o. male with a past medical history notable for ESRD on HD admitted with MV, AV, and TV issues s/p repair/replacement.   Assessment/ Plan # Tricuspid, aortic, mitral valve insufficiencies - Status post Tri valve repair on 8/15.  Per cardiothoracic surgery  # Complete heart block - getting paced via epicardial wires. Per EP/ cards.   # ESRD - pt was too hypotensive for hemodialysis post-op. Started on CRRT 8/16. Cont CRRT until off of pressors.    # Volume/ shock - pt is at his dry wt, on exam is euvolemic and before Theone Murdoch was pulled yesterday, his wedge pressure was 4. Agree w/ titrating pressors off according to SBP.  Keeping even w/ CRRT. Will change to keep + 50 cc/hr.    # Anemia esrd - Hb down to 7s post op. Transfuse as needed.     # MBD ckd - CCa and phos are in range. Continue to monitor.   # Vascular access - RUE AVF functioning well but unable to use due to shock.  Temporary dialysis catheter placed for CRRT.   [name] MD  [facility] 06/26/2023, 11:03 AM  Recent Labs  Lab 06/25/23 1321 06/25/23 2158 06/26/23 0321  HGB 7.6* 7.5* 7.4*  ALBUMIN 2.9*  --   2.9*  CALCIUM 8.6*  --  8.6*  PHOS 4.4  --  3.9  CREATININE 3.28*  --  3.19*  K 4.9  --  4.7   Recent Labs  Lab 06/26/23 0754  IRON 121  TIBC 133*  FERRITIN 1,444*   Inpatient medications:  sodium chloride   Intravenous Once   acetaminophen  1,000 mg Oral Q6H   Or   acetaminophen (TYLENOL) oral liquid 160 mg/5 mL  1,000 mg Oral Q6H   acyclovir  200 mg Oral BID   atorvastatin  10 mg Oral QHS   bisacodyl  10 mg Oral Daily   Or   bisacodyl  10 mg Rectal Daily   Chlorhexidine Gluconate Cloth  6 each Topical Daily   docusate  100 mg Oral Daily   insulin aspart  0-24 Units Subcutaneous Q4H   lidocaine-prilocaine  1 Application Topical Q M,W,F   omeprazole  40 mg Oral BID AC   sodium chloride flush  3 mL Intravenous Q12H     prismasol BGK 4/2.5 400 mL/hr at 06/26/23 0820  prismasol BGK 4/2.5 400 mL/hr at 06/25/23 1855   sodium chloride Stopped (06/23/23 1106)   sodium chloride     sodium chloride 10 mL/hr at 06/25/23 1100   albumin human Stopped (06/23/23 0040)   epinephrine 13 mcg/min (06/26/23 0800)   lactated ringers     lactated ringers 10 mL/hr at 06/26/23 0800   norepinephrine (LEVOPHED) Adult infusion 6 mcg/min (06/26/23 0800)   prismasol BGK 4/2.5 1,500 mL/hr at 06/26/23 0405   vasopressin 0.04 Units/min (06/26/23 0800)   sodium chloride, albumin human, clonazePAM, heparin, HYDROmorphone (DILAUDID) injection, metoprolol tartrate, ondansetron (ZOFRAN) IV, mouth rinse, oxyCODONE, phenol, sodium chloride, sodium chloride flush

## 2023-06-26 NOTE — Progress Notes (Addendum)
      [address]       [place] [address]             [phone]       4 Days Post-Op Procedure(s) (LRB): TRICUSPID VALVE REPAIR MC3 Ring size 30 (N/A) MITRAL VALVE REPAIR (MVR) utilizing Simulis Band size 30mm (N/A) AORTIC VALVE REPLACEMENT (AVR) inspiris valve size 27 (N/A) MAZE (N/A) TRANSESOPHAGEAL ECHOCARDIOGRAM (N/A)  Subjective:  Patient states doing okay.    Objective: Vital signs in last 24 hours: Temp:  [97.4 F (36.3 C)-98.6 F (37 C)] 98.2 F (36.8 C) (08/19 0330) Pulse Rate:  [79-88] 84 (08/19 0645) Cardiac Rhythm: A-V Sequential paced (08/18 1600) Resp:  [0-38] 21 (08/19 0645) BP: (62-123)/(13-65) 66/30 (08/19 0600) SpO2:  [92 %-99 %] 96 % (08/19 0645) Arterial Line BP: (79-119)/(34-51) 97/44 (08/19 0645) Weight:  [94.6 kg] 94.6 kg (08/19 0500)  Intake/Output from previous day: 08/18 0701 - 08/19 0700 In: 1829.4 [P.O.:240; I.V.:1589.4] Out: 2155.9 [Chest Tube:250]  General appearance: alert, cooperative, and no distress Heart: paced Lungs: diminished breath sounds bibasilar Abdomen: soft, non-tender; bowel sounds normal; no masses,  no organomegaly Extremities: edema trace Wound: clean and dry  Lab Results: Recent Labs    06/25/23 0112 06/25/23 1321 06/25/23 2158 06/26/23 0321  WBC 10.8*  --   --  7.6  HGB 6.4*   < > 7.5* 7.4*  HCT 20.7*   < > 23.6* 24.2*  PLT 100*  --   --  101*   < > = values in this interval not displayed.   BMET:  Recent Labs    06/25/23 1321 06/26/23 0321  NA 133* 134*  K 4.9 4.7  CL 102 102  CO2 23 23  GLUCOSE 129* 127*  BUN 20 18  CREATININE 3.28* 3.19*  CALCIUM 8.6* 8.6*    PT/INR: No results for input(s): "LABPROT", "INR" in the last 72 hours. ABG    Component Value Date/Time   PHART 7.300 (L) 06/23/2023 0841   HCO3 19.7 (L) 06/23/2023 0841   TCO2 21 (L) 06/23/2023 0841   ACIDBASEDEF 6.0 (H) 06/23/2023 0841   O2SAT 99 06/23/2023 0841   CBG (last 3)  Recent Labs     06/25/23 1119 06/25/23 1954 06/26/23 0327  GLUCAP 105* 117* 122*    Assessment/Plan: S/P Procedure(s) (LRB): TRICUSPID VALVE REPAIR MC3 Ring size 30 (N/A) MITRAL VALVE REPAIR (MVR) utilizing Simulis Band size 30mm (N/A) AORTIC VALVE REPLACEMENT (AVR) inspiris valve size 27 (N/A) MAZE (N/A) TRANSESOPHAGEAL ECHOCARDIOGRAM (N/A)  CV- paced, remains on Epi @ 13 and Levophed @ 6... BP remains low... EP consulted Pulm- CT output 250 cc, will d/c today, wean as tolerated ESRD- currently on CRRT, staying even, tolerating low BP, Nephrology following Expected post operative blood loss anemia, hgb low at 7.4 despite transfusions... will give 2 additional units today Chronic Thrombocytopenia- count at 101 CBGs controlled, continue SSIP, patient is not a diabetic will d/c sugar checks once he is more stable Dispo- continues to require pacer, d/c CTs today, transfusion, wean drips as tolerated, continue ICU care   LOS: 4 days    Lowella Dandy, PA-C 06/26/2023   Agree with above Will transfuse 2 units today EP following Wean gtts as tolerated  Nicole Defino O Tashala Cumbo

## 2023-06-26 NOTE — Progress Notes (Signed)
      [address]       [place] [address]             [phone]    POD # 4 AVR, MV repair, TV repair, Maze  BP (!) 116/54   Pulse 84   Temp 98.1 F (36.7 C) (Oral)   Resp (!) 27   Ht 6\' 3"  (1.905 m)   Wt 94.6 kg   SpO2 91%   BMI 26.07 kg/m    Epi @ 13, norepi 6, vasopressin 0.04   Intake/Output Summary (Last 24 hours) at 06/26/2023 1721 Last data filed at 06/26/2023 1502 Gross per 24 hour  Intake 2071.21 ml  Output 1504.9 ml  Net 566.31 ml   On CVVHD- running slightly positive currently  Continue current care  Ytzel Gubler C. Dorris Fetch, MD [facility] [phone]

## 2023-06-26 NOTE — Progress Notes (Signed)
Rounding Note    Patient Name: [name] Date of Encounter: 06/26/2023  [facility] Cardiologist: None   Subjective   Sleeping, wakes easily, denies complaints, getting CVVHD  Inpatient Medications    Scheduled Meds:  sodium chloride   Intravenous Once   acetaminophen  1,000 mg Oral Q6H   Or   acetaminophen (TYLENOL) oral liquid 160 mg/5 mL  1,000 mg Oral Q6H   acyclovir  200 mg Oral BID   atorvastatin  10 mg Oral QHS   bisacodyl  10 mg Oral Daily   Or   bisacodyl  10 mg Rectal Daily   Chlorhexidine Gluconate Cloth  6 each Topical Daily   docusate  100 mg Oral Daily   insulin aspart  0-24 Units Subcutaneous Q4H   lidocaine-prilocaine  1 Application Topical Q M,W,F   omeprazole  40 mg Oral BID AC   sodium chloride flush  3 mL Intravenous Q12H   Continuous Infusions:   prismasol BGK 4/2.5 400 mL/hr at 06/26/23 0820    prismasol BGK 4/2.5 400 mL/hr at 06/25/23 1855   sodium chloride Stopped (06/23/23 1106)   sodium chloride     sodium chloride 10 mL/hr at 06/25/23 1100   albumin human Stopped (06/23/23 0040)   epinephrine 13 mcg/min (06/26/23 0800)   lactated ringers     lactated ringers 10 mL/hr at 06/26/23 0800   norepinephrine (LEVOPHED) Adult infusion 6 mcg/min (06/26/23 0800)   prismasol BGK 4/2.5 1,500 mL/hr at 06/26/23 0405   vasopressin 0.04 Units/min (06/26/23 0800)   PRN Meds: sodium chloride, albumin human, clonazePAM, heparin, HYDROmorphone (DILAUDID) injection, metoprolol tartrate, ondansetron (ZOFRAN) IV, mouth rinse, oxyCODONE, phenol, sodium chloride, sodium chloride flush   Vital Signs    Vitals:   06/26/23 0630 06/26/23 0645 06/26/23 0700 06/26/23 0800  BP:   (!) 72/24 (!) 70/29  Pulse:  84 84 84  Resp: (!) 25 (!) 21 20 (!) 26  Temp:    98.2 F (36.8 C)  TempSrc:    Oral  SpO2:  96% 95% 96%  Weight:      Height:        Intake/Output Summary (Last 24 hours) at 06/26/2023 0928 Last data filed at 06/26/2023 0800 Gross per 24  hour  Intake 1676.69 ml  Output 2013.7 ml  Net -337.01 ml      06/26/2023    5:00 AM 06/25/2023    6:00 AM 06/24/2023    5:00 AM  Last 3 Weights  Weight (lbs) 208 lb 8.9 oz 212 lb 4.9 oz 206 lb 9.1 oz  Weight (kg) 94.6 kg 96.3 kg 93.7 kg      Telemetry    paced - Personally Reviewed  ECG    No new EKGs - Personally Reviewed  Physical Exam   GEN: No acute distress, appears ill .   Neck: No JVD Cardiac: RRR, no murmurs, rubs, or gallops.  Respiratory: Clear to auscultation bilaterally (ant/lat auscultation only). GI: Soft, nontender, non-distended  MS: No edema; No deformity. Neuro:  Nonfocal  Psych: Normal affect   Labs    High Sensitivity Troponin:  No results for input(s): "TROPONINIHS" in the last 720 hours.   Chemistry Recent Labs  Lab 06/20/23 1130 06/22/23 0648 06/24/23 0411 06/24/23 1559 06/25/23 0115 06/25/23 1321 06/26/23 0321  NA 135   < > 134*   < > 133* 133* 134*  K 5.6*   < > 3.9   < > 4.9 4.9 4.7  CL 97*   < >  100   < > 101 102 102  CO2 23   < > 21*   < > 22 23 23   GLUCOSE 71   < > 120*   < > 107* 129* 127*  BUN 52*   < > 33*   < > 23* 20 18  CREATININE 10.48*   < > 5.70*   < > 4.08* 3.28* 3.19*  CALCIUM 9.0   < > 7.8*   < > 8.2* 8.6* 8.6*  MG  --    < > 2.1  --  2.3  --  2.4  PROT 9.0*  --   --   --   --   --   --   ALBUMIN 3.7   < > 2.4*   < > 3.0* 2.9* 2.9*  AST 48*  --   --   --   --   --   --   ALT 38  --   --   --   --   --   --   ALKPHOS 94  --   --   --   --   --   --   BILITOT 0.7  --   --   --   --   --   --   GFRNONAA 5*   < > 11*   < > 17* 22* 23*  ANIONGAP 15   < > 13   < > 10 8 9    < > = values in this interval not displayed.    Lipids No results for input(s): "CHOL", "TRIG", "HDL", "LABVLDL", "LDLCALC", "CHOLHDL" in the last 168 hours.  Hematology Recent Labs  Lab 06/24/23 0411 06/25/23 0112 06/25/23 1321 06/25/23 2158 06/26/23 0321  WBC 10.6* 10.8*  --   --  7.6  RBC 2.24* 1.95*  --   --  2.36*  HGB 7.4* 6.4* 7.6*  7.5* 7.4*  HCT 22.7* 20.7* 24.5* 23.6* 24.2*  MCV 101.3* 106.2*  --   --  102.5*  MCH 33.0 32.8  --   --  31.4  MCHC 32.6 30.9  --   --  30.6  RDW 14.7 15.3  --   --  17.8*  PLT 109* 100*  --   --  101*   Thyroid No results for input(s): "TSH", "FREET4" in the last 168 hours.  BNPNo results for input(s): "BNP", "PROBNP" in the last 168 hours.  DDimer No results for input(s): "DDIMER" in the last 168 hours.   Radiology    DG CHEST PORT 1 VIEW  Result Date: 06/25/2023 CLINICAL DATA:  Evaluate chest tube placement EXAM: PORTABLE CHEST 1 VIEW COMPARISON:  06/23/2023. FINDINGS: Interval removal of the Swan-Ganz catheter. Right IJ Cordis sheath remains in place with tip in the SVC. There is a left IJ catheter with tip projecting over the SVC. ET tube and enteric tube have been removed. Drainage catheters overlying the right side of the mediastinum appear unchanged from previous exam. No pneumothorax identified. Bilateral pleural effusions. Persistent bilateral pleural effusions and interstitial edema with bibasilar atelectasis. IMPRESSION: 1. Interval removal of ET tube, enteric tube, and Swan-Ganz catheter. 2. Persistent bilateral pleural effusions and interstitial edema with bibasilar atelectasis. Electronically Signed   By: Signa Kell M.D.   On: 06/25/2023 10:13    Cardiac Studies   04/18/23: TTE 1. Left ventricular ejection fraction, by estimation, is 55 to 60%. The  left ventricle has normal function. The left ventricle has no regional  wall motion abnormalities.  Left ventricular diastolic parameters are  consistent with Grade III diastolic  dysfunction (restrictive).   2. Right ventricular systolic function is low normal. The right  ventricular size is moderately enlarged. There is moderately elevated  pulmonary artery systolic pressure. The estimated right ventricular  systolic pressure is 47.5 mmHg.   3. Left atrial size was severely dilated.   4. Right atrial size was severely  dilated.   5. The mitral valve is normal in structure. Moderate to severe mitral  valve regurgitation. No evidence of mitral stenosis.   6. Tricuspid valve regurgitation is severe.   7. The aortic valve is normal in structure. There is moderate  calcification of the aortic valve. There is mild thickening of the aortic  valve. Aortic valve regurgitation is trivial. Mild aortic valve stenosis.  Aortic valve area, by VTI measures 2.74  cm. Aortic valve mean gradient measures 15.6 mmHg.   8. The inferior vena cava is dilated in size at 4.3cm with <50%  respiratory variability, suggesting right atrial pressure of 15 mmHg.   Patient Profile     52 y.o. male w/hx of Anxiety, depression, Bipolar disease, CHF, CKD s/p renal transplant 1999 -> redo 2015 -> ESRD/HD, and severe MR/severe TR/Moderate AS   Tricuspid Valve Repair, Mitral Valve Repair, Aortic Valve Replacement and MAZE 8/15   >> CHB pacing via epicardial wires  Assessment & Plan   Post op CHB/SND Pacing via epicardial wires Will run threshold/check underlying with EP MD If epicardial wires start to fail will need temp pacing wire placed  Too sick for EP procedure/PPM right now     C/w CTS, nephrology, CCM shock Remains on multiple pressors (epi, norepi, vasopressin) CVVHD vis left internal jugular catheter Extubated anemia    For questions or updates, please contact [facility] Please consult [email or web address] for contact info under        Signed, Sheilah Pigeon, PA-C  06/26/2023, 9:28 AM

## 2023-06-26 NOTE — Evaluation (Signed)
Occupational Therapy Evaluation Patient Details Name: [name] MRN: [id number] DOB: 1971/03/23 Today's Date: 06/26/2023   History of Present Illness Pt is a 52 y.o. male admitted 06/22/23 for same day planned tricuspid valve repair, mitral valve repair, aortic valve replacement, maze, TEE. Post-op course complicated by CHB, shock, AKI, respiratory failure. CRRT initiated 8/16. ETT 8/15-8/17. PMH includes CHF, HTN, NICM, polyneuropathy, ESRD on HD (s/p renal transplant 1999, 2015), bipolar, ADHD, anxiety, depression.   Clinical Impression   Pt ind at baseline with ADLs, used a cane and R knee brace for functional mobility PTA, lives alone. Pt currently needing min-max A for ADLs, mod A +2 for bed mobility and mod A +2 for transfers without AD but with R knee blocked. Family planning to bring pt's R knee brace today. Pt with decr cognition, disoriented to date, needs increased cues/repetition to follow commands during session. Pt presenting with impairments listed below, will follow acutely. Patient will benefit from intensive inpatient follow up therapy, >3 hours/day to maximize safety/ind with ADLs/functional mobility.        If plan is discharge home, recommend the following: Two people to help with walking and/or transfers;A lot of help with bathing/dressing/bathroom;Assistance with cooking/housework;Direct supervision/assist for financial management;Direct supervision/assist for medications management;Assist for transportation;Help with stairs or ramp for entrance    Functional Status Assessment  Patient has had a recent decline in their functional status and demonstrates the ability to make significant improvements in function in a reasonable and predictable amount of time.  Equipment Recommendations  Other (comment) (defer)    Recommendations for Other Services PT consult     Precautions / Restrictions Precautions Precautions: Fall;Sternal Precaution Comments: CRRT, external pacer,  chest tube, etc. Restrictions Other Position/Activity Restrictions: sternal prec      Mobility Bed Mobility Overal bed mobility: Needs Assistance Bed Mobility: Supine to Sit, Sit to Supine     Supine to sit: Mod assist, +2 for safety/equipment, HOB elevated Sit to supine: Mod assist, +2 for physical assistance, +2 for safety/equipment   General bed mobility comments: mod A for trunk elevation and returning BLE's into bed    Transfers Overall transfer level: Needs assistance Equipment used: None Transfers: Sit to/from Stand Sit to Stand: Mod assist, +2 safety/equipment           General transfer comment: use of heart pillow      Balance Overall balance assessment: Needs assistance Sitting-balance support: No upper extremity supported, Feet supported Sitting balance-Leahy Scale: Fair     Standing balance support: No upper extremity supported, During functional activity Standing balance-Leahy Scale: Poor                             ADL either performed or assessed with clinical judgement   ADL Overall ADL's : Needs assistance/impaired Eating/Feeding: Minimal assistance;Bed level   Grooming: Minimal assistance;Bed level   Upper Body Bathing: Maximal assistance;Bed level   Lower Body Bathing: Maximal assistance;Bed level   Upper Body Dressing : Moderate assistance;Bed level   Lower Body Dressing: Maximal assistance;Bed level   Toilet Transfer: Moderate assistance;+2 for physical assistance   Toileting- Clothing Manipulation and Hygiene: Maximal assistance       Functional mobility during ADLs: Moderate assistance;+2 for physical assistance       Vision Baseline Vision/History: 1 Wears glasses Vision Assessment?: No apparent visual deficits     Perception Perception: Not tested       Praxis Praxis: Not tested  Pertinent Vitals/Pain Pain Assessment Pain Assessment: No/denies pain     Extremity/Trunk Assessment Upper Extremity  Assessment Upper Extremity Assessment: Overall WFL for tasks assessed (within limits of sternal prec)   Lower Extremity Assessment Lower Extremity Assessment: Defer to PT evaluation       Communication Communication Communication: No apparent difficulties   Cognition Arousal: Lethargic Behavior During Therapy: Flat affect Overall Cognitive Status: No family/caregiver present to determine baseline cognitive functioning                                 General Comments: needs increased time, repeition to answer questions, frequently closing eyes during session, disoriented to date     General Comments  VSS    Exercises     Shoulder Instructions      Home Living Family/patient expects to be discharged to:: Private residence Living Arrangements: Alone Available Help at Discharge: Family;Available 24 hours/day Type of Home: Apartment Home Access: Level entry     Home Layout: One level     Bathroom Shower/Tub: Tub/shower unit         Home Equipment: Agricultural consultant (2 wheels);Cane - single point          Prior Functioning/Environment Prior Level of Function : Independent/Modified Independent;Working/employed;Driving             Mobility Comments: mod indep with SPC, wears R knee brace for stability; 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; drives ADLs Comments: mod indep        OT Problem List: Decreased strength;Decreased range of motion;Decreased activity tolerance;Impaired balance (sitting and/or standing);Decreased cognition;Decreased safety awareness;Cardiopulmonary status limiting activity      OT Treatment/Interventions: Self-care/ADL training;Therapeutic exercise;Energy conservation;DME and/or AE instruction;Therapeutic activities;Balance training;Patient/family education;Cognitive remediation/compensation    OT Goals(Current goals can be found in the care plan section) Acute Rehab OT Goals Patient Stated Goal: none  stated OT Goal Formulation: With patient Time For Goal Achievement: 07/10/23 Potential to Achieve Goals: Good ADL Goals Pt Will Perform Upper Body Dressing: with min assist;sitting Pt Will Perform Lower Body Dressing: with min assist;sit to/from stand;sitting/lateral leans Pt Will Transfer to Toilet: with min assist;bedside commode;squat pivot transfer;stand pivot transfer  OT Frequency: Min 1X/week    Co-evaluation              AM-PAC OT "6 Clicks" Daily Activity     Outcome Measure Help from another person eating meals?: A Little Help from another person taking care of personal grooming?: A Little Help from another person toileting, which includes using toliet, bedpan, or urinal?: A Lot Help from another person bathing (including washing, rinsing, drying)?: A Lot Help from another person to put on and taking off regular upper body clothing?: A Lot Help from another person to put on and taking off regular lower body clothing?: A Lot 6 Click Score: 14   End of Session Nurse Communication: Mobility status (RN present throughout session)  Activity Tolerance: Patient tolerated treatment well Patient left: in bed;with call bell/phone within reach;with bed alarm set  OT Visit Diagnosis: Unsteadiness on feet (R26.81);Other abnormalities of gait and mobility (R26.89);Muscle weakness (generalized) (M62.81)                Time: 0347-4259 OT Time Calculation (min): 20 min Charges:  OT General Charges $OT Visit: 1 Visit OT Evaluation $OT Eval Moderate Complexity: 1 Mod  Gini Caputo K, OTD, OTR/L SecureChat Preferred Acute Rehab (336)  [phone]   Dalphine Handing 06/26/2023, 12:52 PM

## 2023-06-26 NOTE — Progress Notes (Signed)
PCCM Interval Progress Note:  Sitting outside room when patient desaturated to 60s with poor pleth after laying flat/turning for bath/linen change. Sat 80s with O2 sensor change/good pleth. NRB mask placed with improvement in sat to 88-89%.   CXR obtained and wet, c/f flash pulmonary edema. pCO2 63. Advised RN to pull on CRRT, will initiate BiPAP. Bedside POCUS to assess R pleural space completed with moderate pleural effusion and atelectatic lung.  Pigtail chest tube placed with dark sanguinous output (see separate procedure note).  Family updated at bedside and via phone.  Tim Lair, PA-C [facility] 06/26/23 5:39 PM  Please see [email or web address] for pager details.  From 7A-7P if no response, please call [phone] After hours, please call ELink [phone]

## 2023-06-26 NOTE — Evaluation (Addendum)
Clinical/Bedside Swallow Evaluation Patient Details  Name: [name] MRN: [id number] Date of Birth: June 04, 1971  Today's Date: 06/26/2023 Time: SLP Start Time (ACUTE ONLY): 0957 SLP Stop Time (ACUTE ONLY): 1006 SLP Time Calculation (min) (ACUTE ONLY): 9 min  Past Medical History:  Past Medical History:  Diagnosis Date   ADHD (attention deficit hyperactivity disorder)    Anemia    Anemia, chronic disease 07/13/2011   Anxiety    Arthritis    Bipolar 2 disorder (HCC) 08/18/2011   Diagnosed in 07/17/2010.    Bipolar disorder (HCC)    Blood dyscrasia    Nephrotic Anemia   Blood transfusion    Blood transfusion without reported diagnosis    Chronic diastolic CHF (congestive heart failure) (HCC)    Complication of anesthesia    Woke up intubated gets combative  afraid to be alone    Congestive heart failure (CHF) (HCC) 07/13/2011   Onset 07-17-2005.  Followed by Dr. Eden Emms.  S/p cardiac catheterization in [facility] Dr. Anne Fu..  S/p cardiac catheterization in 2013-07-17 by Nishan.  Echo 17-Jul-2013.    Depression    Dysrhythmia    A. Fib 07-18-2023 with DCCV in May 2024   Erectile dysfunction    ESRD on hemodialysis (HCC) 07/13/2011   Glomerulonephritis at age 44, started HD in 1995/07/18.  Deceased donor renal transplant 07-17-98 at [facility], then kidney failed and went back on HD in 07-17-2005.  L forearm AVF . s/p repeat renal transplant 10/2014 [facility].   GI bleed 11/01/2011   Rectal bleeding with emesis/diarrhea    Heart murmur    Hemodialysis status (HCC)    Hydrocele, right 11/25/2014   Hypertension    Hypertensive emergency 01/06/2013   Hypoglycemia 01/11/2013   Influenza-like illness 12/18/2013   Mild ascending aorta dilatation (HCC)    NICM (nonischemic cardiomyopathy) (HCC)    Pericardial effusion    a. Mod by echo in July 17, 2013, similar to prior.   Peripheral polyneuropathy 08/04/2016   Pneumonia    Pulmonary edema 01/06/2013   Pulmonary hypertension associated with end stage renal disease on dialysis [facility]) 07/13/2011    Renal transplant, status post 11/25/2014   Pt with Glomerulonephritis at age 89. Deceased donor renal transplant 07-17-1998 right and left renal transplant 10/2014.  Followed by [facility] transplant team; nephrologist is Dr. Lonna Cobb.    Respiratory failure with hypoxia (HCC) 01/06/2013   Shortness of breath 12/12/2012   Swollen testicle 07/13/2011   URI (upper respiratory infection) 01/25/2012   Past Surgical History:  Past Surgical History:  Procedure Laterality Date   ANGIOPLASTY     AORTIC VALVE REPLACEMENT N/A 06/22/2023   Procedure: AORTIC VALVE REPLACEMENT (AVR) inspiris valve size 27;  Surgeon: Eugenio Hoes, MD;  Location: [facility] OR;  Service: Open Heart Surgery;  Laterality: N/A;   BUBBLE STUDY  09/05/2022   Procedure: BUBBLE STUDY;  Surgeon: Sande Rives, MD;  Location: [facility] ENDOSCOPY;  Service: Cardiovascular;;   CARDIOVERSION N/A 04/13/2023   Procedure: CARDIOVERSION;  Surgeon: Jake Bathe, MD;  Location: [facility] INVASIVE CV LAB;  Service: Cardiovascular;  Laterality: N/A;   CHOLECYSTECTOMY     DIALYSIS FISTULA CREATION     EVALUATION UNDER ANESTHESIA WITH HEMORRHOIDECTOMY N/A 03/15/2020   Procedure: CONTROL OF ANAL BLEEDING;  Surgeon: Andria Meuse, MD;  Location: [facility] OR;  Service: General;  Laterality: N/A;   KIDNEY TRANSPLANT  10/17/2014   KNEE SURGERY Right    arthroscopic   LEFT HEART CATHETERIZATION WITH CORONARY ANGIOGRAM N/A 10/01/2013   Procedure: LEFT  HEART CATHETERIZATION WITH CORONARY ANGIOGRAM;  Surgeon: Wendall Stade, MD;  Location: [facility] CATH LAB;  Service: Cardiovascular;  Laterality: N/A;   MAZE N/A 06/22/2023   Procedure: MAZE;  Surgeon: Eugenio Hoes, MD;  Location: [facility] OR;  Service: Open Heart Surgery;  Laterality: N/A;   MITRAL VALVE REPAIR N/A 06/22/2023   Procedure: MITRAL VALVE REPAIR (MVR) utilizing Simulis Band size 30mm;  Surgeon: Eugenio Hoes, MD;  Location: [facility] OR;  Service: Open Heart Surgery;  Laterality: N/A;   MOUTH SURGERY     teeth  removed   NEPHRECTOMY TRANSPLANTED ORGAN     RIGHT/LEFT HEART CATH AND CORONARY ANGIOGRAPHY N/A 11/24/2022   Procedure: RIGHT/LEFT HEART CATH AND CORONARY ANGIOGRAPHY;  Surgeon: Kathleene Hazel, MD;  Location: [facility] INVASIVE CV LAB;  Service: Cardiovascular;  Laterality: N/A;   TEE WITHOUT CARDIOVERSION N/A 09/05/2022   Procedure: TRANSESOPHAGEAL ECHOCARDIOGRAM (TEE);  Surgeon: Sande Rives, MD;  Location: [facility] ENDOSCOPY;  Service: Cardiovascular;  Laterality: N/A;   TEE WITHOUT CARDIOVERSION N/A 06/22/2023   Procedure: TRANSESOPHAGEAL ECHOCARDIOGRAM;  Surgeon: Eugenio Hoes, MD;  Location: [facility] OR;  Service: Open Heart Surgery;  Laterality: N/A;   TRICUSPID VALVE REPLACEMENT N/A 06/22/2023   Procedure: TRICUSPID VALVE REPAIR MC3 Ring size 30;  Surgeon: Eugenio Hoes, MD;  Location: [facility] OR;  Service: Open Heart Surgery;  Laterality: N/A;   HPI:  This is a 52 year old gentleman, Patient underwent surgery 8/15  for severe mitral regurgitation severe tricuspid regurgitation and moderate aortic stenosis.  He also has atrial fibrillation received maze procedure.  He had a complex mitral valve repair a tricuspid valve annuloplasty and aortic valve replacement followed by full right and left maze procedure. Extubated 8/17. past medical history of tracheostomy in 2011 (reason unknown, pt reports "real bad pna"), anxiety depression, bipolar disease, congestive heart failure chronic diastolic heart failure kidney disease followed by renal transplant in 1999 repeat renal transplant in 2015.    Assessment / Plan / Recommendation  Clinical Impression  Pt demonstrates subtle signs of dysphagia; he takes very small single sips with multiple effortful swallows with thin and puree. Pt is drowsy and attempts to tell me about his prior trach and any history of dysphagia, but struggles to elaborate. Pt has weak baseline cough, dysphonia, chest wound that he is protective of; additionally he coughs after sips.  Recommend FEES to determine safety with PO and ability to advance. He is ok to continue meds in puree and small sips of water. Discussed with RN.  Will need to plan for tomorrow rather than today.  SLP Visit Diagnosis: Dysphagia, unspecified (R13.10)    Aspiration Risk       Diet Recommendation NPO except meds         Other  Recommendations      Recommendations for follow up therapy are one component of a multi-disciplinary discharge planning process, led by the attending physician.  Recommendations may be updated based on patient status, additional functional criteria and insurance authorization.  Follow up Recommendations        Assistance Recommended at Discharge    Functional Status Assessment    Frequency and Duration            Prognosis        Swallow Study   General HPI: This is a 52 year old gentleman, Patient underwent surgery 8/15  for severe mitral regurgitation severe tricuspid regurgitation and moderate aortic stenosis.  He also has atrial fibrillation received maze procedure.  He had a complex mitral valve  repair a tricuspid valve annuloplasty and aortic valve replacement followed by full right and left maze procedure. Extubated 8/17. past medical history of tracheostomy in 2011 (reason unknown, pt reports "real bad pna"), anxiety depression, bipolar disease, congestive heart failure chronic diastolic heart failure kidney disease followed by renal transplant in 1999 repeat renal transplant in 2015. Type of Study: Bedside Swallow Evaluation Previous Swallow Assessment: unknown, likely Diet Prior to this Study: Thin liquids (Level 0) Temperature Spikes Noted: No Respiratory Status: Nasal cannula History of Recent Intubation: Yes Total duration of intubation (days): 3 days Date extubated: 06/24/23 Behavior/Cognition: Lethargic/Drowsy Oral Cavity Assessment: Other (comment) (white patches on tongue) Oral Care Completed by SLP: No Oral Cavity - Dentition: Adequate  natural dentition Vision: Impaired for self-feeding Self-Feeding Abilities: Total assist Patient Positioning: Upright in bed Baseline Vocal Quality: Hoarse Volitional Cough: Congested;Weak Volitional Swallow: Able to elicit    Oral/Motor/Sensory Function Overall Oral Motor/Sensory Function: Within functional limits   Ice Chips     Thin Liquid Thin Liquid: Impaired Presentation: Straw Pharyngeal  Phase Impairments: Multiple swallows;Cough - Delayed    Nectar Thick Nectar Thick Liquid: Not tested   Honey Thick Honey Thick Liquid: Not tested   Puree Puree: Impaired Presentation: Spoon Pharyngeal Phase Impairments: Multiple swallows (grimace, posture)   Solid     Solid: Not tested      Huntley Abila, Riley Nearing 06/26/2023,10:17 AM

## 2023-06-26 NOTE — Plan of Care (Signed)
  Problem: Cardiac: Goal: Will achieve and/or maintain hemodynamic stability Outcome: Progressing   Problem: Respiratory: Goal: Respiratory status will improve Outcome: Progressing   Problem: Pain Managment: Goal: General experience of comfort will improve Outcome: Progressing   Problem: Activity: Goal: Risk for activity intolerance will decrease Outcome: Progressing

## 2023-06-27 ENCOUNTER — Inpatient Hospital Stay (HOSPITAL_COMMUNITY): Payer: Medicare HMO

## 2023-06-27 DIAGNOSIS — Z9889 Other specified postprocedural states: Secondary | ICD-10-CM | POA: Diagnosis not present

## 2023-06-27 LAB — CBC
HCT: 27.3 % — ABNORMAL LOW (ref 39.0–52.0)
HCT: 26.3 % — ABNORMAL LOW (ref 39.0–52.0)
Hemoglobin: 8.5 g/dL — ABNORMAL LOW (ref 13.0–17.0)
Hemoglobin: 8.1 g/dL — ABNORMAL LOW (ref 13.0–17.0)
MCH: 31.3 pg (ref 26.0–34.0)
MCH: 32 pg (ref 26.0–34.0)
MCHC: 31.1 g/dL (ref 30.0–36.0)
MCHC: 30.8 g/dL (ref 30.0–36.0)
MCV: 100.4 fL — ABNORMAL HIGH (ref 80.0–100.0)
MCV: 104 fL — ABNORMAL HIGH (ref 80.0–100.0)
Platelets: 92 10*3/uL — ABNORMAL LOW (ref 150–400)
Platelets: 79 10*3/uL — ABNORMAL LOW (ref 150–400)
RBC: 2.72 MIL/uL — ABNORMAL LOW (ref 4.22–5.81)
RBC: 2.53 MIL/uL — ABNORMAL LOW (ref 4.22–5.81)
RDW: 19 % — ABNORMAL HIGH (ref 11.5–15.5)
RDW: 18.6 % — ABNORMAL HIGH (ref 11.5–15.5)
WBC: 8.5 10*3/uL (ref 4.0–10.5)
WBC: 7.1 10*3/uL (ref 4.0–10.5)
nRBC: 4.7 % — ABNORMAL HIGH (ref 0.0–0.2)
nRBC: 4.2 % — ABNORMAL HIGH (ref 0.0–0.2)

## 2023-06-27 LAB — GLUCOSE, CAPILLARY
Glucose-Capillary: 88 mg/dL (ref 70–99)
Glucose-Capillary: 82 mg/dL (ref 70–99)
Glucose-Capillary: 110 mg/dL — ABNORMAL HIGH (ref 70–99)
Glucose-Capillary: 119 mg/dL — ABNORMAL HIGH (ref 70–99)
Glucose-Capillary: 101 mg/dL — ABNORMAL HIGH (ref 70–99)

## 2023-06-27 LAB — RENAL FUNCTION PANEL
Albumin: 3 g/dL — ABNORMAL LOW (ref 3.5–5.0)
Albumin: 2.9 g/dL — ABNORMAL LOW (ref 3.5–5.0)
Anion gap: 8 (ref 5–15)
Anion gap: 7 (ref 5–15)
BUN: 16 mg/dL (ref 6–20)
BUN: 15 mg/dL (ref 6–20)
CO2: 24 mmol/L (ref 22–32)
CO2: 25 mmol/L (ref 22–32)
Calcium: 8.9 mg/dL (ref 8.9–10.3)
Calcium: 9 mg/dL (ref 8.9–10.3)
Chloride: 100 mmol/L (ref 98–111)
Chloride: 102 mmol/L (ref 98–111)
Creatinine, Ser: 2.63 mg/dL — ABNORMAL HIGH (ref 0.61–1.24)
Creatinine, Ser: 2.4 mg/dL — ABNORMAL HIGH (ref 0.61–1.24)
GFR, Estimated: 29 mL/min — ABNORMAL LOW (ref >60)
GFR, Estimated: 32 mL/min — ABNORMAL LOW (ref >60)
Glucose, Bld: 97 mg/dL (ref 70–99)
Glucose, Bld: 120 mg/dL — ABNORMAL HIGH (ref 70–99)
Phosphorus: 3.5 mg/dL (ref 2.5–4.6)
Phosphorus: 3.2 mg/dL (ref 2.5–4.6)
Potassium: 4.6 mmol/L (ref 3.5–5.1)
Potassium: 4.6 mmol/L (ref 3.5–5.1)
Sodium: 132 mmol/L — ABNORMAL LOW (ref 135–145)
Sodium: 134 mmol/L — ABNORMAL LOW (ref 135–145)

## 2023-06-27 LAB — BPAM RBC
Blood Product Expiration Date: 202409042359
Blood Product Expiration Date: 202409042359
ISSUE DATE / TIME: 202408191019
ISSUE DATE / TIME: 202408191225
Unit Type and Rh: 6200
Unit Type and Rh: 6200

## 2023-06-27 LAB — COOXEMETRY PANEL
Carboxyhemoglobin: 2 % — ABNORMAL HIGH (ref 0.5–1.5)
Methemoglobin: <0.7 % (ref 0.0–1.5)
O2 Saturation: 86.7 %
Total hemoglobin: 8.6 g/dL — ABNORMAL LOW (ref 12.0–16.0)

## 2023-06-27 LAB — TYPE AND SCREEN
ABO/RH(D): A POS
Antibody Screen: NEGATIVE
Unit division: 0
Unit division: 0

## 2023-06-27 LAB — AMMONIA: Ammonia: 44 umol/L — ABNORMAL HIGH (ref 9–35)

## 2023-06-27 LAB — MAGNESIUM: Magnesium: 2.4 mg/dL (ref 1.7–2.4)

## 2023-06-27 MED ORDER — RENA-VITE PO TABS
1.0000 | ORAL_TABLET | Freq: Every day | ORAL | Status: DC
  Administered 2023-06-27 – 2023-06-28 (×2): 1 via ORAL
  Filled 2023-06-27 (×2): qty 1

## 2023-06-27 MED ORDER — OXYCODONE HCL 5 MG/5ML PO SOLN
2.5000 mg | ORAL | Status: DC | PRN
  Administered 2023-06-27 – 2023-06-28 (×4): 2.5 mg via ORAL
  Filled 2023-06-27 (×4): qty 5

## 2023-06-27 NOTE — Procedures (Signed)
Objective Swallowing Evaluation: Type of Study: FEES-Fiberoptic Endoscopic Evaluation of Swallow   Patient Details  Name: [name] MRN: [id number] Date of Birth: 05/28/1971  Today's Date: 06/27/2023 Time: SLP Start Time (ACUTE ONLY): 0935 -SLP Stop Time (ACUTE ONLY): 0950  SLP Time Calculation (min) (ACUTE ONLY): 15 min   Past Medical History:  Past Medical History:  Diagnosis Date   ADHD (attention deficit hyperactivity disorder)    Anemia    Anemia, chronic disease 07/13/2011   Anxiety    Arthritis    Bipolar 2 disorder (HCC) 08/18/2011   Diagnosed in Jul 26, 2010.    Bipolar disorder (HCC)    Blood dyscrasia    Nephrotic Anemia   Blood transfusion    Blood transfusion without reported diagnosis    Chronic diastolic CHF (congestive heart failure) (HCC)    Complication of anesthesia    Woke up intubated gets combative  afraid to be alone    Congestive heart failure (CHF) (HCC) 07/13/2011   Onset 2005/07/26.  Followed by Dr. Eden Emms.  S/p cardiac catheterization in [facility] Dr. Anne Fu..  S/p cardiac catheterization in 2013/07/26 by Nishan.  Echo 07-26-13.    Depression    Dysrhythmia    A. Fib 07-27-23 with DCCV in May 2024   Erectile dysfunction    ESRD on hemodialysis (HCC) 07/13/2011   Glomerulonephritis at age 37, started HD in 1995-07-27.  Deceased [facility] renal transplant 26-Jul-1998 at [facility] then kidney failed and went back on HD in 07-26-05.  L forearm AVF . s/p repeat renal transplant 10/2014 [facility].   GI bleed 11/01/2011   Rectal bleeding with emesis/diarrhea    Heart murmur    Hemodialysis status (HCC)    Hydrocele, right 11/25/2014   Hypertension    Hypertensive emergency 01/06/2013   Hypoglycemia 01/11/2013   Influenza-like illness 12/18/2013   Mild ascending aorta dilatation (HCC)    NICM (nonischemic cardiomyopathy) (HCC)    Pericardial effusion    a. Mod by echo in Jul 26, 2013, similar to prior.   Peripheral polyneuropathy 08/04/2016   Pneumonia    Pulmonary edema 01/06/2013   Pulmonary  hypertension associated with end stage renal disease on dialysis [facility]) 07/13/2011   Renal transplant, status post 11/25/2014   Pt with Glomerulonephritis at age 29. Deceased donor renal transplant 1998-07-26 right and left renal transplant 10/2014.  Followed by [facility] transplant team; nephrologist is Dr. Lonna Cobb.    Respiratory failure with hypoxia (HCC) 01/06/2013   Shortness of breath 12/12/2012   Swollen testicle 07/13/2011   URI (upper respiratory infection) 01/25/2012   Past Surgical History:  Past Surgical History:  Procedure Laterality Date   ANGIOPLASTY     AORTIC VALVE REPLACEMENT N/A 06/22/2023   Procedure: AORTIC VALVE REPLACEMENT (AVR) inspiris valve size 27;  Surgeon: Eugenio Hoes, MD;  Location: [facility] OR;  Service: Open Heart Surgery;  Laterality: N/A;   BUBBLE STUDY  09/05/2022   Procedure: BUBBLE STUDY;  Surgeon: Sande Rives, MD;  Location: [facility] ENDOSCOPY;  Service: Cardiovascular;;   CARDIOVERSION N/A 04/13/2023   Procedure: CARDIOVERSION;  Surgeon: Jake Bathe, MD;  Location: [facility] INVASIVE CV LAB;  Service: Cardiovascular;  Laterality: N/A;   CHOLECYSTECTOMY     DIALYSIS FISTULA CREATION     EVALUATION UNDER ANESTHESIA WITH HEMORRHOIDECTOMY N/A 03/15/2020   Procedure: CONTROL OF ANAL BLEEDING;  Surgeon: Andria Meuse, MD;  Location: [facility] OR;  Service: General;  Laterality: N/A;   KIDNEY TRANSPLANT  10/17/2014   KNEE SURGERY Right    arthroscopic  LEFT HEART CATHETERIZATION WITH CORONARY ANGIOGRAM N/A 10/01/2013   Procedure: LEFT HEART CATHETERIZATION WITH CORONARY ANGIOGRAM;  Surgeon: Wendall Stade, MD;  Location: [facility] CATH LAB;  Service: Cardiovascular;  Laterality: N/A;   MAZE N/A 06/22/2023   Procedure: MAZE;  Surgeon: Eugenio Hoes, MD;  Location: [facility] OR;  Service: Open Heart Surgery;  Laterality: N/A;   MITRAL VALVE REPAIR N/A 06/22/2023   Procedure: MITRAL VALVE REPAIR (MVR) utilizing Simulis Band size 30mm;  Surgeon: Eugenio Hoes, MD;  Location: [facility]  OR;  Service: Open Heart Surgery;  Laterality: N/A;   MOUTH SURGERY     teeth removed   NEPHRECTOMY TRANSPLANTED ORGAN     RIGHT/LEFT HEART CATH AND CORONARY ANGIOGRAPHY N/A 11/24/2022   Procedure: RIGHT/LEFT HEART CATH AND CORONARY ANGIOGRAPHY;  Surgeon: Kathleene Hazel, MD;  Location: [facility] INVASIVE CV LAB;  Service: Cardiovascular;  Laterality: N/A;   TEE WITHOUT CARDIOVERSION N/A 09/05/2022   Procedure: TRANSESOPHAGEAL ECHOCARDIOGRAM (TEE);  Surgeon: Sande Rives, MD;  Location: [facility] ENDOSCOPY;  Service: Cardiovascular;  Laterality: N/A;   TEE WITHOUT CARDIOVERSION N/A 06/22/2023   Procedure: TRANSESOPHAGEAL ECHOCARDIOGRAM;  Surgeon: Eugenio Hoes, MD;  Location: [facility] OR;  Service: Open Heart Surgery;  Laterality: N/A;   TRICUSPID VALVE REPLACEMENT N/A 06/22/2023   Procedure: TRICUSPID VALVE REPAIR MC3 Ring size 30;  Surgeon: Eugenio Hoes, MD;  Location: [facility] OR;  Service: Open Heart Surgery;  Laterality: N/A;   HPI: This is a 52 year old gentleman, Patient underwent surgery 8/15  for severe mitral regurgitation severe tricuspid regurgitation and moderate aortic stenosis.  He also has atrial fibrillation received maze procedure.  He had a complex mitral valve repair a tricuspid valve annuloplasty and aortic valve replacement followed by full right and left maze procedure. Extubated 8/17. past medical history of tracheostomy in 2011 (reason unknown, pt reports "real bad pna"), anxiety depression, bipolar disease, congestive heart failure chronic diastolic heart failure kidney disease followed by renal transplant in 1999 repeat renal transplant in 2015.   No data recorded   Recommendations for follow up therapy are one component of a multi-disciplinary discharge planning process, led by the attending physician.  Recommendations may be updated based on patient status, additional functional criteria and insurance authorization.  Assessment / Plan / Recommendation     06/27/2023   11:00  AM  Clinical Impressions  Clinical Impression Pt demonstrates mild oropharyngeal dysphagia; he is still persistently drowsy though he always responds and is easy to wake up. Mastication is prolonged but functional. Pt typically takes small sips and has audible multiple swallows though there is not an observable cause for this upon direct visualization. There is no residue with thin liquids that would elicit mutliple swallows. Pt does have mild vallecular residue with solids but using a liquid wash clears this well. Will initiate a dys 3/thin liquids diet with at least one check in for tolerance, though expect quantity of intake to be limited for some time given pts low level of activity.         06/27/2023   11:00 AM  Treatment Recommendations  Treatment Recommendations Therapy as outlined in treatment plan below        06/27/2023   11:00 AM  Prognosis  Prognosis for improved oropharyngeal function Good       06/27/2023   11:00 AM  Diet Recommendations  SLP Diet Recommendations Thin liquid;Dysphagia 3 (Mech soft) solids  Liquid Administration via Cup;Straw  Medication Administration Whole meds with puree  Compensations Slow rate;Small sips/bites  Postural  Changes Remain semi-upright after after feeds/meals (Comment)         06/27/2023   11:00 AM  Other Recommendations  Follow Up Recommendations Skilled nursing-short term rehab (<3 hours/day)       06/27/2023   11:00 AM  Frequency and Duration   Speech Therapy Frequency (ACUTE ONLY) min 2x/week  Treatment Duration 1 week         06/27/2023   11:00 AM  Oral Phase  Oral Phase Impaired  Oral - Thin Teaspoon NT  Oral - Thin Cup NT  Oral - Thin Straw WFL  Oral - Puree WFL  Oral - Regular Impaired mastication       06/27/2023   11:00 AM  Pharyngeal Phase  Pharyngeal Phase Impaired         No data to display           Alaira Level, Riley Nearing 06/27/2023, 12:08 PM

## 2023-06-27 NOTE — Progress Notes (Signed)
NAME:  [name], MRN:  [id number], DOB:  1971/04/12, LOS: 5 ADMISSION DATE:  06/22/2023, CONSULTATION DATE: 06/22/2023 REFERRING MD: Leafy Ro - TCTS, CHIEF COMPLAINT: Postcardiac surgery  History of Present Illness:  This is a 52 year old gentleman, past medical history of anxiety depression, bipolar disease, congestive heart failure chronic diastolic heart failure kidney disease followed by renal transplant in 1998-07-27 repeat renal transplant in Jul 27, 2014.  Patient underwent surgery today for severe mitral regurgitation severe tricuspid regurgitation and moderate aortic stenosis.  He also has atrial fibrillation received maze procedure.  He had a complex mitral valve repair a tricuspid valve annuloplasty and aortic valve replacement followed by full right and left maze procedure.  Pertinent Medical History:   Past Medical History:  Diagnosis Date   ADHD (attention deficit hyperactivity disorder)    Anemia    Anemia, chronic disease 07/13/2011   Anxiety    Arthritis    Bipolar 2 disorder (HCC) 08/18/2011   Diagnosed in 2010-07-27.    Bipolar disorder (HCC)    Blood dyscrasia    Nephrotic Anemia   Blood transfusion    Blood transfusion without reported diagnosis    Chronic diastolic CHF (congestive heart failure) (HCC)    Complication of anesthesia    Woke up intubated gets combative  afraid to be alone    Congestive heart failure (CHF) (HCC) 07/13/2011   Onset 27-Jul-2005.  Followed by Dr. Eden Emms.  S/p cardiac catheterization in [facility] Dr. Anne Fu..  S/p cardiac catheterization in 07/27/2013 by Nishan.  Echo 07/27/13.    Depression    Dysrhythmia    A. Fib Jul 28, 2023 with DCCV in May 2024   Erectile dysfunction    ESRD on hemodialysis (HCC) 07/13/2011   Glomerulonephritis at age 89, started HD in 07-28-95.  Deceased donor renal transplant July 27, 1998 at [facility], then kidney failed and went back on HD in 2005-07-27.  L forearm AVF . s/p repeat renal transplant 10/2014 [facility].   GI bleed 11/01/2011   Rectal bleeding with  emesis/diarrhea    Heart murmur    Hemodialysis status (HCC)    Hydrocele, right 11/25/2014   Hypertension    Hypertensive emergency 01/06/2013   Hypoglycemia 01/11/2013   Influenza-like illness 12/18/2013   Mild ascending aorta dilatation (HCC)    NICM (nonischemic cardiomyopathy) (HCC)    Pericardial effusion    a. Mod by echo in Jul 27, 2013, similar to prior.   Peripheral polyneuropathy 08/04/2016   Pneumonia    Pulmonary edema 01/06/2013   Pulmonary hypertension associated with end stage renal disease on dialysis [facility]) 07/13/2011   Renal transplant, status post 11/25/2014   Pt with Glomerulonephritis at age 69. Deceased donor renal transplant Jul 27, 1998 right and left renal transplant 10/2014.  Followed by [facility] transplant team; nephrologist is Dr. Lonna Cobb.    Respiratory failure with hypoxia (HCC) 01/06/2013   Shortness of breath 12/12/2012   Swollen testicle 07/13/2011   URI (upper respiratory infection) 01/25/2012    Significant Hospital Events: Including procedures, antibiotic start and stop dates in addition to other pertinent events   8/15 - AVR, MVR, TVR with TCTS ([place]). Remains on multiple pressors (NE, Epi, vaso). 8/16 - LIJ Trialysis catheter placed for CRRT.  8/17 extubated  8/18 - remains on CVVHD, 3 pressors   Interim History / Subjective:  Looks a little better today Pressor needs are down Still on good deal of O2 [place] continues, unclear source.  Objective:  Blood pressure (!) 118/48, pulse 82, temperature (!) 96.6 F (35.9 C), temperature source Axillary,  resp. rate 20, height 6\' 3"  (1.905 m), weight 96.5 kg, SpO2 100%.    Vent Mode: BIPAP FiO2 (%):  [40 %-60 %] 40 % Set Rate:  [12 bmp] 12 bmp PEEP:  [6 cmH20-8 cmH20] 6 cmH20 Pressure Support:  [10 cmH20] 10 cmH20   Intake/Output Summary (Last 24 hours) at 06/27/2023 1045 Last data filed at 06/27/2023 1000 Gross per 24 hour  Intake 2058.32 ml  Output 2011 ml  Net 47.32 ml   Filed Weights    06/25/23 0600 06/26/23 0500 06/27/23 0400  Weight: 96.3 kg 94.6 kg 96.5 kg   Physical Examination: No distress on BIPAP Follows commands R pleural catheter serosanguinous output, no air leak RASS -1 Ext warm Levo off, now on epi + vaso  Chemistries okay Hgb responded to blood yesterday  Patient Lines/Drains/Airways Status     Active Line/Drains/Airways     Name Placement date Placement time Site Days   Arterial Line 06/22/23 Left Brachial 06/22/23  0700  Brachial  5   Peripheral IV 06/22/23 16 G Left Forearm 06/22/23  0645  Forearm  5   Vascular Access Left Forearm Arteriovenous fistula --  --  Forearm  --   Fistula / Graft Right Upper arm Arteriovenous fistula 06/22/23  1900  Upper arm  5   Hemodialysis Catheter Left Internal jugular 06/26/23  0700  Internal jugular  1   Chest Tube 1 Lateral;Right Pleural 06/26/23  1830  Pleural  1   Incision (Closed) 06/22/23 Chest Other (Comment) 06/22/23  1107  -- 5            Resolved Hospital Problem List:   Bloody OG output, OGT removed after extubation  ?UGIB  Question injury from TEE probe. - continue PPI  - no other signs of active bleeding   Assessment & Plan:   Mitral valve regurgitation, tricuspid regurgitation and aortic stenosis status post valvular repair x 3 - Postoperative management per TCTS - Drain management per TCTS - Multimodal pain control: reducing sedating meds - Keep pigtail for now, monitor output  Postoperative shock and vasoplegia ABLA, postoperative as expected Thrombocytopenia, ?chronic Acute renal failure in the setting of hemodynamic insults on CKD History of kidney disease status post renal transplant - vaso/epi for MAP 65 - trying a little negative on CRRT but would go back to even if pressor needs increase, think he may be intravascularly dry - Check coox, CVP  Postoperative encephalopathy- check ammonia, reduce sedating meds, encourage day/night cyles  At risk malnutrition Postoperative  constipation vs. Ileus- suspect former - Check KUB - Consider cortrak if not taking enough PO  Postoperative respiratory failure - BIPAP at bedtime and PRN - Wean HHFNC for sats > 90% - Needs to mobilize if able  Best Practice (right click and "Reselect all SmartList Selections" daily)   Diet/type: clear liquid, advance as tolerated DVT prophylaxis:SCD until H/H stable GI prophylaxis: PPI Lines: Central line Foley:  Yes, and it is still needed Code Status:  full code Last date of multidisciplinary goals of care discussion [Per Primary Team]  33 min cc time  Lorin Glass, MD [facility] 06/27/2023 10:45 AM

## 2023-06-27 NOTE — Progress Notes (Signed)
Physical Therapy Treatment Patient Details Name: [name] MRN: [id number] DOB: Apr 06, 1971 Today's Date: 06/27/2023   History of Present Illness Pt is a 52 y.o. male admitted 06/22/23 for same day planned tricuspid valve repair, mitral valve repair, aortic valve replacement, maze, TEE. Post-op course complicated by CHB, shock, AKI, respiratory failure. CRRT initiated 8/16. ETT 8/15-8/17. PMH includes CHF, HTN, NICM, polyneuropathy, ESRD on HD (s/p renal transplant 1999, 2015), bipolar, ADHD, anxiety, depression.    PT Comments  Pt unfortunately had to return to Bipap overnight but has since weaned off of it and then had to have another chest tube placed yesterday. Pt very lethargic and very difficult to keep awake, will fall asleep mid talking. Pt did initiate movement to command however would loose alertness/attn. Pt maxA for mobility to EOB and to maintain EOB sitting, maxAX2 to complete 2 stds, used R knee brace family brought in. Current d/ recommendation remains appropriate. Acute PT to cont to follow.    If plan is discharge home, recommend the following: A lot of help with walking and/or transfers;A lot of help with bathing/dressing/bathroom;Assistance with cooking/housework;Assist for transportation;Help with stairs or ramp for entrance   Can travel by private vehicle        Equipment Recommendations  Other (comment) (TBD)    Recommendations for Other Services OT consult;Rehab consult     Precautions / Restrictions Precautions Precautions: Fall;Sternal Precaution Booklet Issued: No Precaution Comments: CRRT, external pacer, chest tube, etc. Restrictions Weight Bearing Restrictions: Yes RUE Weight Bearing: Partial weight bearing LUE Weight Bearing: Partial weight bearing Other Position/Activity Restrictions: sternal prec     Mobility  Bed Mobility Overal bed mobility: Needs Assistance Bed Mobility: Supine to Sit, Sit to Supine     Supine to sit: +2 for safety/equipment,  HOB elevated, Max assist Sit to supine: +2 for physical assistance, +2 for safety/equipment, Max assist   General bed mobility comments: max A for trunk elevation and returning BLE's into bed    Transfers Overall transfer level: Needs assistance Equipment used: None Transfers: Sit to/from Stand Sit to Stand: +2 safety/equipment, Max assist           General transfer comment: use of heart pillow, face to face transfer with use of pad under patient. Completed 2 stands, unable to achieve full upright posture due to weakness and trunk flexion despite max verbal and tactile cues. bilat knee blocked, unable to step to [facility]    Ambulation/Gait                   Stairs             Wheelchair Mobility     Tilt Bed    Modified Rankin (Stroke Patients Only)       Balance Overall balance assessment: Needs assistance Sitting-balance support: No upper extremity supported, Feet supported Sitting balance-Leahy Scale: Poor Sitting balance - Comments: pt with posterior lean/bias, brings trunk forward to commands but unable to maintain, requires constant verbal and tactile cues to maintain EOB upright posture   Standing balance support: No upper extremity supported, During functional activity Standing balance-Leahy Scale: Poor Standing balance comment: reliant on external assist standing without UE support                            Cognition Arousal: Lethargic Behavior During Therapy: Flat affect Overall Cognitive Status: No family/caregiver present to determine baseline cognitive functioning  General Comments: needs increased time, repeition to answer questions, frequently closing eyes during session, disoriented to date, difficulty staying awake        Exercises      General Comments General comments (skin integrity, edema, etc.): VSS      Pertinent Vitals/Pain Pain Assessment Pain Assessment:  Faces Faces Pain Scale: Hurts little more Pain Location: grimacing with movement, then reported bilat knee pain in standing Pain Descriptors / Indicators: Sore, Tiring    Home Living                          Prior Function            PT Goals (current goals can now be found in the care plan section) Acute Rehab PT Goals PT Goal Formulation: With patient Time For Goal Achievement: 07/09/23 Potential to Achieve Goals: Good Progress towards PT goals: Progressing toward goals    Frequency    Min 1X/week      PT Plan      Co-evaluation              AM-PAC PT "6 Clicks" Mobility   Outcome Measure  Help needed turning from your back to your side while in a flat bed without using bedrails?: A Lot Help needed moving from lying on your back to sitting on the side of a flat bed without using bedrails?: A Lot Help needed moving to and from a bed to a chair (including a wheelchair)?: A Lot Help needed standing up from a chair using your arms (e.g., wheelchair or bedside chair)?: A Lot Help needed to walk in hospital room?: Total Help needed climbing 3-5 steps with a railing? : Total 6 Click Score: 10    End of Session   Activity Tolerance: Patient limited by fatigue;Patient limited by lethargy Patient left: in bed;with call bell/phone within reach;with nursing/sitter in room;with family/visitor present Nurse Communication: Mobility status PT Visit Diagnosis: Other abnormalities of gait and mobility (R26.89);Muscle weakness (generalized) (M62.81)     Time: 1101-1130 PT Time Calculation (min) (ACUTE ONLY): 29 min  Charges:    $Therapeutic Activity: 23-37 mins PT General Charges $$ ACUTE PT VISIT: 1 Visit                     [name], PT, DPT Acute Rehabilitation Services Secure chat preferred Office #: [phone]    [name] 06/27/2023, 12:58 PM

## 2023-06-27 NOTE — Progress Notes (Signed)
[facility] Progress Note  Subjective: seen in ICU  Vitals:   06/27/23 0820 06/27/23 0900 06/27/23 1000 06/27/23 1100  BP:      Pulse:  82 82   Resp:  (!) 26 20   Temp: (!) 96.6 F (35.9 C)   98.5 F (36.9 C)  TempSrc: Axillary   Oral  SpO2:  100% 100%   Weight:      Height:        Exam: Gen lethargic but arouses and responds appropriately  No jvd or bruits Chest clear anterior/ lateral, bilat CT's RRR no RG Abd soft ntnd no mass or ascites +bs GU normal male MS no joint effusions or deformity Ext  no LE or UE edema       RUE AVF +bruit     OP HD: [place] triad 3h    F250     94kg   2K/2.5 Ca bath   RUE AVF   Summary: [name] is a/an 52 y.o. male with a past medical history notable for ESRD on HD admitted with MV, AV, and TV issues s/p repair/replacement.   Assessment/ Plan # Tricuspid, aortic, mitral valve insufficiencies - Status post Tri valve repair on 8/15.  Per cardiothoracic surgery  # Complete heart block - getting paced via epicardial wires. Per EP/ cards.   # ESRD - pt was too hypotensive for hemodialysis post-op. Started on CRRT 8/16. Cont CRRT until off of pressors.    # Volume/ shock - not much edema on exam but CXR's cont to look possibly wet. Was on bipap last night. Will try to pull  50- 100 cc/hr w/ CRRT.    # Anemia esrd - Hb down to 7s post op. Transfuse as needed.     # MBD ckd - CCa and phos are in range. Continue to monitor.   # Vascular access - RUE AVF functioning well but unable to use due to shock.  Temporary dialysis catheter placed for CRRT.   [name] MD  [facility] 06/27/2023, 1:10 PM  Recent Labs  Lab 06/26/23 1642 06/26/23 1732 06/27/23 0422  HGB 8.8* 9.9* 8.5*  ALBUMIN 3.0*  --  3.0*  CALCIUM 8.9  --  8.9  PHOS 3.7  --  3.5  CREATININE 2.82*  --  2.63*  K 4.7 4.7 4.6   Recent Labs  Lab 06/26/23 0754  IRON 121  TIBC 133*  FERRITIN 1,444*   Inpatient medications:  sodium chloride   Intravenous  Once   acetaminophen  1,000 mg Oral Q6H   Or   acetaminophen (TYLENOL) oral liquid 160 mg/5 mL  1,000 mg Oral Q6H   acyclovir  200 mg Oral BID   atorvastatin  10 mg Oral QHS   bisacodyl  10 mg Oral Daily   Or   bisacodyl  10 mg Rectal Daily   Chlorhexidine Gluconate Cloth  6 each Topical Daily   docusate  100 mg Oral Daily   insulin aspart  0-24 Units Subcutaneous Q4H   lidocaine-prilocaine  1 Application Topical Q M,W,F   omeprazole  40 mg Oral BID AC   sodium chloride flush  3 mL Intravenous Q12H     prismasol BGK 4/2.5 400 mL/hr at 06/27/23 0629    prismasol BGK 4/2.5 400 mL/hr at 06/27/23 0537   sodium chloride Stopped (06/23/23 1106)   sodium chloride     sodium chloride 10 mL/hr at 06/25/23 1100   albumin human Stopped (06/23/23 0040)   epinephrine  9 mcg/min (06/27/23 1200)   lactated ringers     lactated ringers 10 mL/hr at 06/27/23 1200   norepinephrine (LEVOPHED) Adult infusion Stopped (06/26/23 2225)   prismasol BGK 4/2.5 1,500 mL/hr at 06/27/23 0908   vasopressin 0.02 Units/min (06/27/23 1200)   sodium chloride, albumin human, heparin, metoprolol tartrate, ondansetron (ZOFRAN) IV, mouth rinse, oxyCODONE, phenol, sodium chloride, sodium chloride flush

## 2023-06-27 NOTE — Progress Notes (Signed)
      [address]       [place] [address]             [phone]                 5 Days Post-Op Procedure(s) (LRB): TRICUSPID VALVE REPAIR MC3 Ring size 30 (N/A) MITRAL VALVE REPAIR (MVR) utilizing Simulis Band size 30mm (N/A) AORTIC VALVE REPLACEMENT (AVR) inspiris valve size 27 (N/A) MAZE (N/A) TRANSESOPHAGEAL ECHOCARDIOGRAM (N/A)   Events: On bipap overnight Pigtail placed yesterday _______________________________________________________________ Vitals: BP (!) 116/54   Pulse 84   Temp 97.7 F (36.5 C) (Axillary)   Resp 19   Ht 6\' 3"  (1.905 m)   Wt 96.5 kg   SpO2 100%   BMI 26.59 kg/m  Filed Weights   06/25/23 0600 06/26/23 0500 06/27/23 0400  Weight: 96.3 kg 94.6 kg 96.5 kg     - Neuro: alert NAD  - Cardiovascular: paced  Drips: epi9,  vaso 0.04.      - Pulm:  EWOB  ABG    Component Value Date/Time   PHART 7.217 (L) 06/26/2023 1732   PCO2ART 64.1 (H) 06/26/2023 1732   PO2ART 222 (H) 06/26/2023 1732   HCO3 26.0 06/26/2023 1732   TCO2 28 06/26/2023 1732   ACIDBASEDEF 2.0 06/26/2023 1732   O2SAT 100 06/26/2023 1732    - Abd: ND - Extremity: warm  .Intake/Output      08/19 0701 08/20 0700 08/20 0701 08/21 0700   P.O.     I.V. (mL/kg) 1504.3 (15.6)    Blood 613.7    Total Intake(mL/kg) 2118 (21.9)    Chest Tube 910    CRRT 1268.5    Total Output 2178.5    Net -60.5            _______________________________________________________________ Labs:    Latest Ref Rng & Units 06/27/2023    4:22 AM 06/26/2023    5:32 PM 06/26/2023    4:42 PM  CBC  WBC 4.0 - 10.5 K/uL 8.5   8.1   Hemoglobin 13.0 - 17.0 g/dL 8.5  9.9  8.8   Hematocrit 39.0 - 52.0 % 27.3  29.0  28.4   Platelets 150 - 400 K/uL 92   96       Latest Ref Rng & Units 06/27/2023    4:22 AM 06/26/2023    5:32 PM 06/26/2023    4:42 PM  CMP  Glucose 70 - 99 mg/dL 97   914   BUN 6 - 20 mg/dL 16   16   Creatinine 7.82 - 1.24 mg/dL 9.56   2.13   Sodium 086 - 145  mmol/L 132  136  135   Potassium 3.5 - 5.1 mmol/L 4.6  4.7  4.7   Chloride 98 - 111 mmol/L 100   99   CO2 22 - 32 mmol/L 24   23   Calcium 8.9 - 10.3 mg/dL 8.9   8.9     CXR: -  _______________________________________________________________  Assessment and Plan: POD 5 s/p MVR, TVR, AVR.  In heart block  Neuro: pain controlled CV: weaning gtts.  Pulm: remains on bipap Renal: on CRRT GI: NPO for now.  Consider coretrack Heme: stable ID: [email or web address] Endo: SSI Dispo: continue ICU care   Leenah Seidner O Asia Dusenbury 06/27/2023 8:04 AM

## 2023-06-27 NOTE — Progress Notes (Signed)
      [address]       [place] [address]             [phone]      5 Days Post-Op Procedure(s) (LRB): TRICUSPID VALVE REPAIR MC3 Ring size 30 (N/A) MITRAL VALVE REPAIR (MVR) utilizing Simulis Band size 30mm (N/A) AORTIC VALVE REPLACEMENT (AVR) inspiris valve size 27 (N/A) MAZE (N/A) TRANSESOPHAGEAL ECHOCARDIOGRAM (N/A)  Subjective:  Events of last night noted.  Remain on BIPAP  Objective: Vital signs in last 24 hours: Temp:  [97.4 F (36.3 C)-98.7 F (37.1 C)] 97.7 F (36.5 C) (08/20 0400) Pulse Rate:  [84-86] 84 (08/20 0700) Cardiac Rhythm: A-V Sequential paced (08/19 2001) Resp:  [14-30] 19 (08/20 0700) BP: (70-116)/(29-54) 116/54 (08/19 1245) SpO2:  [91 %-100 %] 100 % (08/20 0700) Arterial Line BP: (96-136)/(41-66) 115/46 (08/20 0700) FiO2 (%):  [40 %-60 %] 40 % (08/20 0308) Weight:  [96.5 kg] 96.5 kg (08/20 0400)  Intake/Output from previous day: 08/19 0701 - 08/20 0700 In: 2118 [I.V.:1504.3; Blood:613.7] Out: 2178.5 [Chest Tube:910]  General appearance: cooperative Heart: regular rate and rhythm and paced Lungs: diminished breath sounds bibasilar Abdomen: soft, non-tender; bowel sounds normal; no masses,  no organomegaly Extremities: edema trace to mild pitting Wound: clean and dry  Lab Results: Recent Labs    06/26/23 1642 06/26/23 1732 06/27/23 0422  WBC 8.1  --  8.5  HGB 8.8* 9.9* 8.5*  HCT 28.4* 29.0* 27.3*  PLT 96*  --  92*   BMET:  Recent Labs    06/26/23 1642 06/26/23 1732 06/27/23 0422  NA 135 136 132*  K 4.7 4.7 4.6  CL 99  --  100  CO2 23  --  24  GLUCOSE 108*  --  97  BUN 16  --  16  CREATININE 2.82*  --  2.63*  CALCIUM 8.9  --  8.9    PT/INR: No results for input(s): "LABPROT", "INR" in the last 72 hours. ABG    Component Value Date/Time   PHART 7.217 (L) 06/26/2023 1732   HCO3 26.0 06/26/2023 1732   TCO2 28 06/26/2023 1732   ACIDBASEDEF 2.0 06/26/2023 1732   O2SAT 100 06/26/2023 1732   CBG (last  3)  Recent Labs    06/26/23 1955 06/26/23 2335 06/27/23 0420  GLUCAP 98 99 88    Assessment/Plan: S/P Procedure(s) (LRB): TRICUSPID VALVE REPAIR MC3 Ring size 30 (N/A) MITRAL VALVE REPAIR (MVR) utilizing Simulis Band size 30mm (N/A) AORTIC VALVE REPLACEMENT (AVR) inspiris valve size 27 (N/A) MAZE (N/A) TRANSESOPHAGEAL ECHOCARDIOGRAM (N/A)  CV- CHB, currently paced rhythm- weaned off Levo yesterday, remains on Epi at 9... Pulm- respiratory issues overnight, currently on BIPAP.Marland Kitchen RIght pigtail catheter placed... 910 cc output.Marland Kitchen CXR with improvement of right pleural effusion... CCM following, low threshold for reintubation Renal- ESRD, failed renal transplants x 2.. currently on CRRT due to inability to tolerate HD with low BP and drip support.. Nephrology following Expected post operative blood loss anemia, Hgb improved to 8.5 this morning after 2 units of blood.. patient has chronic anemia at baseline Dysphagia-failed swallow study-- NPO currently will need to address nutrition needs if unable to take oral intake soon Chronic Thrombocytopenia- stable CBGs stable. Continue SSIP until patient is stable  Continue ICU care   LOS: 5 days   Lowella Dandy, PA-C 06/27/2023

## 2023-06-27 NOTE — Progress Notes (Signed)
Nutrition Follow-up  DOCUMENTATION CODES:   Non-severe (moderate) malnutrition in context of chronic illness  INTERVENTION:   Recommend insertion of Cortrak with initiation of EN. Discussed with Dr. Katrinka Blazing and plan for Cortrak placement tomorrow when service available  Tube Feeding Recommendations via Cortrak:  Pivot 1.5 at 60 ml/hr Pro-Source TF20 60 mL BID TF regimen at goal provides 2320 kcals, 175 g of protein and 1094 mL of free water  Add Renal MVI  Monitor magnesium, potassium, and phosphorus BID for at least 3 days after TF initiation, MD to replete as needed, as pt is at risk for refeeding syndrome   Recommend only feeding patient when alert enough for safe po; discussed plan with RN  No BM since admission; may need to increase bowel regimen if continues without BM, especially once TF initiated  NUTRITION DIAGNOSIS:   Moderate Malnutrition related to chronic illness as evidenced by moderate muscle depletion, moderate fat depletion.  Being addressed via plan for Cortrak/EN  GOAL:   Patient will meet greater than or equal to 90% of their needs  Not Met but being addressed  MONITOR:   PO intake, TF tolerance, Labs, Weight trends  REASON FOR ASSESSMENT:   Consult Assessment of nutrition requirement/status  ASSESSMENT:   52 y/o male with h/o glomerulonephritis (age 35) s/p renal transplant (1999) and right and left renal transplants (2015), ESRD on HD (>20 yrs), HTN, bipolar 2 disorder, PTSD, ADHD, anxiety, depression, GERD, OSA, NICM, thrombocytopenia, pulmonary hypertension, afib s/p DCCV (03/2019), CHF, severe mitral and tricuspid valve regurgitation and moderate aortic stenosis s/p mitral valve repair, tricuspid valve annuloplasty, aortic valve replacement and full right and left maze procedure 8/16.  8/15 AVR, MVR, TVR by Dr. Leafy Ro 8/16 CRRT initiated 8/17 Extubated, CL diet 8/19 Chest tube inserted for effusion, BiPap overnight  Pt somnolent on visit  today, pt requiring constant verbal cues to stay awake. RD had to ask patient 5 times to perform a task before he would respond. Pt has been on CL diet since 8/17, eating minimally  FEES today and diet advanced to Dysphagia 3. Spoke with RN post lunch and RN reports pt ate a couple bites of mashed potatoes and tolerated ok but needed a lot of redirection. RN did notice a bit more difficulty with the pot roast; he only ate two bites because he was not able to stay awake long enough to chew and swallow the meat  Abd xray pending. Abd somewhat firm, no BM since admission  Outpatient EDW 94 kg, current wt 96.5 kg. Noted lowest wt this admission 93.5 kg. Net +3-4 L per I/O flow sheet  Chest tube with 910 mL in 24 hours, only 30 mL thus far today  Labs: sodium 132 (L), phosphorus 3.5, magnesium 2.4, sodium 132 (L) Meds: ss novolog, dulcolax, colace  NUTRITION - FOCUSED PHYSICAL EXAM:  Flowsheet Row Most Recent Value  Orbital Region Moderate depletion  Upper Arm Region Moderate depletion  Thoracic and Lumbar Region Unable to assess  Buccal Region Moderate depletion  Temple Region Moderate depletion  Clavicle Bone Region Severe depletion  Clavicle and Acromion Bone Region Severe depletion  Scapular Bone Region Severe depletion  Dorsal Hand Severe depletion  Patellar Region Unable to assess  Anterior Thigh Region Unable to assess  Posterior Calf Region Unable to assess  Edema (RD Assessment) Mild       Diet Order:   Diet Order             DIET DYS 3  Room service appropriate? No; Fluid consistency: Thin  Diet effective now                   EDUCATION NEEDS:   Not appropriate for education at this time  Skin:  Skin Assessment: Reviewed RN Assessment (incision chest)  Last BM:  pta  Height:   Ht Readings from Last 1 Encounters:  06/22/23 6\' 3"  (1.905 m)    Weight:   Wt Readings from Last 1 Encounters:  06/27/23 96.5 kg    Ideal Body Weight:  89 kg  BMI:  Body  mass index is 26.59 kg/m.  Estimated Nutritional Needs:   Kcal:  2300-2500 kcals  Protein:  160-195 g  Fluid:  1L plus UOP   [name] MS, RDN, LDN, CNSC Registered Dietitian 3 Clinical Nutrition RD Pager and On-Call Pager Number Located in [place]

## 2023-06-27 NOTE — Progress Notes (Signed)
   06/27/23 0825  Oxygen Therapy/Pulse Ox  O2 Device (S)  Nasal Cannula  O2 Therapy (S)  Oxygen humidified  O2 Flow Rate (L/min) (S)  4 L/min   Pt was transitioned from BiPAP to [place] per CCM. Pt currently VSS at this time

## 2023-06-27 NOTE — Progress Notes (Signed)
   06/27/23 2203  Vent Select  Invasive or Noninvasive Noninvasive  Adult Vent Y  Adult Ventilator Settings  Vent Type Servo i  Humidity HME  Vent Mode BIPAP  Set Rate 12 bmp  FiO2 (%) 40 %  I Time 0.9 Sec(s)  IPAP 7 cmH20  EPAP 5 cmH20  Pressure Control 12 cmH20  PEEP 5 cmH20  Adult Ventilator Measurements  Peak Airway Pressure 13 L/min  Mean Airway Pressure 7 cmH20  Resp Rate Spontaneous 10 br/min  Resp Rate Total 22 br/min  Exhaled Vt 482 mL  Measured Ve 10 L  I:E Ratio Measured 1:2.2  Auto PEEP 0 cmH20  Total PEEP 5 cmH20  SpO2 100 %  Adult Ventilator Alarms  Alarms On Y  Ve High Alarm 18 L/min  Ve Low Alarm 5 L/min  Resp Rate High Alarm 36 br/min  Resp Rate Low Alarm 8  PEEP Low Alarm 3 cmH2O  Press High Alarm 25 cmH2O  VAP Prevention  HOB> 30 Degrees Y  Breath Sounds  Bilateral Breath Sounds Diminished

## 2023-06-27 NOTE — Progress Notes (Signed)
Tele reviewed AV paced, no brady or evidence of LOC Chart reviewed Some respiratory insufficiency requiring BIPAP and volume removal Remains on 2 pressors Still too sick for PPM  Epicardial threshold on V lead this morning is 1.5 No underlying at 40bpm Output on V lead left at 28m 85bpm  Should the epicardial wires start to fail, will need temp transvenous wire until stable enough to PPM  EP will follow from afar for now  Francis Dowse, PA-C

## 2023-06-28 ENCOUNTER — Inpatient Hospital Stay (HOSPITAL_COMMUNITY): Payer: Medicare HMO

## 2023-06-28 DIAGNOSIS — Z9889 Other specified postprocedural states: Secondary | ICD-10-CM | POA: Diagnosis not present

## 2023-06-28 DIAGNOSIS — E440 Moderate protein-calorie malnutrition: Secondary | ICD-10-CM | POA: Insufficient documentation

## 2023-06-28 LAB — RENAL FUNCTION PANEL
Albumin: 2.9 g/dL — ABNORMAL LOW (ref 3.5–5.0)
Albumin: 2.9 g/dL — ABNORMAL LOW (ref 3.5–5.0)
Anion gap: 8 (ref 5–15)
Anion gap: 7 (ref 5–15)
BUN: 13 mg/dL (ref 6–20)
BUN: 13 mg/dL (ref 6–20)
CO2: 25 mmol/L (ref 22–32)
CO2: 26 mmol/L (ref 22–32)
Calcium: 9.2 mg/dL (ref 8.9–10.3)
Calcium: 9.4 mg/dL (ref 8.9–10.3)
Chloride: 99 mmol/L (ref 98–111)
Chloride: 101 mmol/L (ref 98–111)
Creatinine, Ser: 2.37 mg/dL — ABNORMAL HIGH (ref 0.61–1.24)
Creatinine, Ser: 2.46 mg/dL — ABNORMAL HIGH (ref 0.61–1.24)
GFR, Estimated: 33 mL/min — ABNORMAL LOW (ref >60)
GFR, Estimated: 31 mL/min — ABNORMAL LOW (ref >60)
Glucose, Bld: 95 mg/dL (ref 70–99)
Glucose, Bld: 97 mg/dL (ref 70–99)
Phosphorus: 2.8 mg/dL (ref 2.5–4.6)
Phosphorus: 2.7 mg/dL (ref 2.5–4.6)
Potassium: 4.3 mmol/L (ref 3.5–5.1)
Potassium: 4.3 mmol/L (ref 3.5–5.1)
Sodium: 132 mmol/L — ABNORMAL LOW (ref 135–145)
Sodium: 134 mmol/L — ABNORMAL LOW (ref 135–145)

## 2023-06-28 LAB — GLUCOSE, CAPILLARY
Glucose-Capillary: 79 mg/dL (ref 70–99)
Glucose-Capillary: 84 mg/dL (ref 70–99)
Glucose-Capillary: 84 mg/dL (ref 70–99)
Glucose-Capillary: 85 mg/dL (ref 70–99)
Glucose-Capillary: 92 mg/dL (ref 70–99)
Glucose-Capillary: 93 mg/dL (ref 70–99)
Glucose-Capillary: 94 mg/dL (ref 70–99)

## 2023-06-28 LAB — COOXEMETRY PANEL
Carboxyhemoglobin: 2.3 % — ABNORMAL HIGH (ref 0.5–1.5)
Methemoglobin: <0.7 % (ref 0.0–1.5)
O2 Saturation: 91.6 %
Total hemoglobin: 8.6 g/dL — ABNORMAL LOW (ref 12.0–16.0)

## 2023-06-28 LAB — CBC
HCT: 26.9 % — ABNORMAL LOW (ref 39.0–52.0)
HCT: 26.7 % — ABNORMAL LOW (ref 39.0–52.0)
Hemoglobin: 8.3 g/dL — ABNORMAL LOW (ref 13.0–17.0)
Hemoglobin: 8.2 g/dL — ABNORMAL LOW (ref 13.0–17.0)
MCH: 32.3 pg (ref 26.0–34.0)
MCH: 32.4 pg (ref 26.0–34.0)
MCHC: 30.9 g/dL (ref 30.0–36.0)
MCHC: 30.7 g/dL (ref 30.0–36.0)
MCV: 104.7 fL — ABNORMAL HIGH (ref 80.0–100.0)
MCV: 105.5 fL — ABNORMAL HIGH (ref 80.0–100.0)
Platelets: 78 10*3/uL — ABNORMAL LOW (ref 150–400)
Platelets: 71 10*3/uL — ABNORMAL LOW (ref 150–400)
RBC: 2.57 MIL/uL — ABNORMAL LOW (ref 4.22–5.81)
RBC: 2.53 MIL/uL — ABNORMAL LOW (ref 4.22–5.81)
RDW: 18.5 % — ABNORMAL HIGH (ref 11.5–15.5)
RDW: 18.3 % — ABNORMAL HIGH (ref 11.5–15.5)
WBC: 6.9 10*3/uL (ref 4.0–10.5)
WBC: 6.7 10*3/uL (ref 4.0–10.5)
nRBC: 5.4 % — ABNORMAL HIGH (ref 0.0–0.2)
nRBC: 5.1 % — ABNORMAL HIGH (ref 0.0–0.2)

## 2023-06-28 LAB — MAGNESIUM
Magnesium: 2.5 mg/dL — ABNORMAL HIGH (ref 1.7–2.4)
Magnesium: 2.6 mg/dL — ABNORMAL HIGH (ref 1.7–2.4)

## 2023-06-28 LAB — POCT I-STAT 7, (LYTES, BLD GAS, ICA,H+H)
Acid-base deficit: 1 mmol/L (ref 0.0–2.0)
Bicarbonate: 26.3 mmol/L (ref 20.0–28.0)
Calcium, Ion: 1.2 mmol/L (ref 1.15–1.40)
HCT: 28 % — ABNORMAL LOW (ref 39.0–52.0)
Hemoglobin: 9.5 g/dL — ABNORMAL LOW (ref 13.0–17.0)
O2 Saturation: 99 %
Patient temperature: 98.5
Potassium: 4.4 mmol/L (ref 3.5–5.1)
Sodium: 139 mmol/L (ref 135–145)
TCO2: 28 mmol/L (ref 22–32)
pCO2 arterial: 52.7 mmHg — ABNORMAL HIGH (ref 32–48)
pH, Arterial: 7.305 — ABNORMAL LOW (ref 7.35–7.45)
pO2, Arterial: 146 mmHg — ABNORMAL HIGH (ref 83–108)

## 2023-06-28 LAB — PHOSPHORUS: Phosphorus: 2.5 mg/dL (ref 2.5–4.6)

## 2023-06-28 MED ORDER — LACTULOSE 10 GM/15ML PO SOLN
10.0000 g | Freq: Two times a day (BID) | ORAL | Status: DC
  Administered 2023-06-28: 10 g
  Filled 2023-06-28: qty 15

## 2023-06-28 MED ORDER — HEPARIN SODIUM (PORCINE) 5000 UNIT/ML IJ SOLN
5000.0000 [IU] | Freq: Three times a day (TID) | INTRAMUSCULAR | Status: DC
  Administered 2023-06-28 – 2023-07-20 (×65): 5000 [IU] via SUBCUTANEOUS
  Filled 2023-06-28 (×65): qty 1

## 2023-06-28 MED ORDER — DEXMEDETOMIDINE HCL IN NACL 400 MCG/100ML IV SOLN
0.0000 ug/kg/h | INTRAVENOUS | Status: DC
  Administered 2023-06-28 – 2023-06-30 (×4): 0.4 ug/kg/h via INTRAVENOUS
  Administered 2023-07-01: 0.2 ug/kg/h via INTRAVENOUS
  Filled 2023-06-28 (×3): qty 100
  Filled 2023-06-28: qty 200

## 2023-06-28 MED ORDER — ORAL CARE MOUTH RINSE: 15.0000 mL | OROMUCOSAL | Status: DC | PRN

## 2023-06-28 MED ORDER — PROSOURCE TF20 ENFIT COMPATIBL EN LIQD
60.0000 mL | Freq: Two times a day (BID) | ENTERAL | Status: DC
  Administered 2023-06-28 – 2023-07-03 (×10): 60 mL
  Filled 2023-06-28 (×11): qty 60

## 2023-06-28 MED ORDER — PIVOT 1.5 CAL PO LIQD
1000.0000 mL | ORAL | Status: DC
  Administered 2023-06-28 – 2023-07-02 (×6): 1000 mL
  Filled 2023-06-28 (×3): qty 1000

## 2023-06-28 MED ORDER — ORAL CARE MOUTH RINSE
15.0000 mL | OROMUCOSAL | Status: DC
  Administered 2023-06-28 – 2023-07-11 (×50): 15 mL via OROMUCOSAL

## 2023-06-28 NOTE — Plan of Care (Signed)

## 2023-06-28 NOTE — Progress Notes (Signed)
Patient ripping off BiPAP mask and refusing to put it back on. Patient is refusing to put nasal cannula on. Current O2 sats 88% room air.

## 2023-06-28 NOTE — Progress Notes (Addendum)
[facility] DAILY ICU PROGRESS NOTE                   [address]            [place] [address]          [phone]   6 Days Post-Op Procedure(s) (LRB): TRICUSPID VALVE REPAIR MC3 Ring size 30 (N/A) MITRAL VALVE REPAIR (MVR) utilizing Simulis Band size 30mm (N/A) AORTIC VALVE REPLACEMENT (AVR) inspiris valve size 27 (N/A) MAZE (N/A) TRANSESOPHAGEAL ECHOCARDIOGRAM (N/A)  Total Length of Stay:  LOS: 6 days   Subjective: Patient on CRRT this am. When asked if abdomen hurts he said yes and has mild diffuse tenderness to palpation. He denies nausea or emesis.  Objective: Vital signs in last 24 hours: Temp:  [96.6 F (35.9 C)-98.7 F (37.1 C)] 98.5 F (36.9 C) (08/21 0400) Pulse Rate:  [81-84] 82 (08/21 0630) Cardiac Rhythm: A-V Sequential paced (08/20 1930) Resp:  [0-35] 27 (08/21 0630) BP: (118)/(48) 118/48 (08/20 0800) SpO2:  [86 %-100 %] 98 % (08/21 0630) Arterial Line BP: (99-130)/(36-53) 114/47 (08/21 0630) FiO2 (%):  [40 %] 40 % (08/20 2203) Weight:  [97.2 kg] 97.2 kg (08/21 0500)  Filed Weights   06/26/23 0500 06/27/23 0400 06/28/23 0500  Weight: 94.6 kg 96.5 kg 97.2 kg    Weight change: 0.7 kg   Hemodynamic parameters for last 24 hours: CVP:  [14 mmHg-20 mmHg] 20 mmHg  Intake/Output from previous day: 08/20 0701 - 08/21 0700 In: 952.9 [P.O.:40; I.V.:912.9] Out: 2128.7 [Chest Tube:130]  Intake/Output this shift: Total I/O In: 414.9 [P.O.:10; I.V.:404.9] Out: 1435.1 [Chest Tube:100]  Current Meds: Scheduled Meds:  sodium chloride   Intravenous Once   acyclovir  200 mg Oral BID   atorvastatin  10 mg Oral QHS   bisacodyl  10 mg Oral Daily   Or   bisacodyl  10 mg Rectal Daily   Chlorhexidine Gluconate Cloth  6 each Topical Daily   docusate  100 mg Oral Daily   insulin aspart  0-24 Units Subcutaneous Q4H   lidocaine-prilocaine  1 Application Topical Q M,W,F   multivitamin  1 tablet Oral QHS   omeprazole  40 mg Oral BID AC   mouth rinse  15  mL Mouth Rinse 4 times per day   sodium chloride flush  3 mL Intravenous Q12H   Continuous Infusions:   prismasol BGK 4/2.5 400 mL/hr at 06/27/23 1849    prismasol BGK 4/2.5 400 mL/hr at 06/27/23 1918   sodium chloride Stopped (06/23/23 1106)   sodium chloride     sodium chloride 10 mL/hr at 06/25/23 1100   albumin human Stopped (06/23/23 0040)   epinephrine 10 mcg/min (06/28/23 0600)   lactated ringers     lactated ringers 10 mL/hr at 06/28/23 0600   norepinephrine (LEVOPHED) Adult infusion Stopped (06/26/23 2225)   prismasol BGK 4/2.5 1,500 mL/hr at 06/28/23 0451   vasopressin Stopped (06/27/23 1705)   PRN Meds:.sodium chloride, albumin human, heparin, metoprolol tartrate, ondansetron (ZOFRAN) IV, mouth rinse, oxyCODONE, phenol, sodium chloride, sodium chloride flush  Neurologic: intact Heart: Paced Lungs: Slightly diminished bibasilar breath sounds Abdomen: Soft, occasional bowel sounds, mild diffusely tender with palpation Extremities: SCDs in place Wound: Sternal wound is clean and dry  Lab Results: CBC: Recent Labs    06/27/23 1647 06/28/23 0420  WBC 7.1 6.9  HGB 8.1* 8.3*  HCT 26.3* 26.9*  PLT 79* 78*   BMET:  Recent Labs    06/27/23 1647  06/28/23 0420  NA 134* 132*  K 4.6 4.3  CL 102 99  CO2 25 25  GLUCOSE 120* 95  BUN 15 13  CREATININE 2.40* 2.37*  CALCIUM 9.0 9.2    CMET: Lab Results  Component Value Date   WBC 6.9 06/28/2023   HGB 8.3 (L) 06/28/2023   HCT 26.9 (L) 06/28/2023   PLT 78 (L) 06/28/2023   GLUCOSE 95 06/28/2023   CHOL  11/04/2007    83        ATP III CLASSIFICATION:  <200     mg/dL   Desirable  782-956  mg/dL   Borderline High  >=213    mg/dL   High   TRIG 60 08/65/7846   HDL 20 (L) 11/04/2007   LDLCALC  11/04/2007    51        Total Cholesterol/HDL:CHD Risk Coronary Heart Disease Risk Table                     Men   Women  1/2 Average Risk   3.4   3.3   ALT 38 06/20/2023   AST 48 (H) 06/20/2023   NA 132 (L) 06/28/2023    K 4.3 06/28/2023   CL 99 06/28/2023   CREATININE 2.37 (H) 06/28/2023   BUN 13 06/28/2023   CO2 25 06/28/2023   TSH 0.668 Test methodology is 3rd generation TSH 11/03/2007   INR 2.8 (H) 06/22/2023   HGBA1C 5.3 06/20/2023      PT/INR: No results for input(s): "LABPROT", "INR" in the last 72 hours. Radiology: DG Abd 1 View  Result Date: 06/27/2023 CLINICAL DATA:  Ileus. EXAM: ABDOMEN - 1 VIEW COMPARISON:  Abdomen and pelvis CT dated 02/16/2011. FINDINGS: Bowel gas pattern. Cholecystectomy clips. Atheromatous arterial calcifications and vas deferens calcifications in the pelvis. Lower lumbar and lower thoracic spine degenerative changes. IMPRESSION: Nonobstructive bowel gas pattern. Electronically Signed   By: Beckie Salts M.D.   On: 06/27/2023 16:46     Assessment/Plan: S/P Procedure(s) (LRB): TRICUSPID VALVE REPAIR MC3 Ring size 30 (N/A) MITRAL VALVE REPAIR (MVR) utilizing Simulis Band size 30mm (N/A) AORTIC VALVE REPLACEMENT (AVR) inspiris valve size 27 (N/A) MAZE (N/A) TRANSESOPHAGEAL ECHOCARDIOGRAM (N/A) CV-CHB. He has epicardial wires. EP following;will need PPM once more stable. Remains AV paced. He is on Epinephrine at 9 this am. Pulmonary-s/p pigtail 08/19 for right pleural effusion. Pigtail with 130 cc last 24 hours. Pigtail to suction. Bi pap at bedtime PRN. Pulmonary/CCM following. ESRD (failed renal transplant x 2)-Started on CRRT 08/16. Per nephrology, this is to continue until off pressors. He has temporary HD catheter in left internal jugular (RUE AVF but not able to use yet). Creatinine this am 2.37 Expected post op blood loss anemia/anemia of chronic disease-H and H this am stable at 8.3 and 26.9 5. GI-Dysphagia,moderate malnutrition. Speech pathology evaluated yesterday. On dysphagia 3 diet. Per nutrition recommendations yesterday, plan is  for Cortrak today. He has not had a bowel movement yet. KUB yesterday showed non obstructive gas pattern. 6. Chronic  thrombocytopenia-platelets this am 78,000 7. CBGs 101/84/84. Pre op HGA1C 5.3. No history of diabetes. Will continue accu checks SS PRN until tolerating po better   Donielle Margaretann Loveless PA-C 06/28/2023 6:56 AM  Agree with above Remains on CRRT Remains on HF [place] Off vaso, on 9 of epi  Amarion Portell O Kenderick Kobler

## 2023-06-28 NOTE — Progress Notes (Signed)
Patient placed on HFNC at 6L. O2 sat at 95%.

## 2023-06-28 NOTE — Progress Notes (Signed)
[facility] Progress Note  Subjective: seen in ICU  Vitals:   06/28/23 1100 06/28/23 1200 06/28/23 1300 06/28/23 1400  BP:      Pulse: 82 82 82 82  Resp: (!) 28 (!) 25 (!) 33 (!) 22  Temp:      TempSrc:      SpO2: 100% 100% 95% 98%  Weight:      Height:        Exam: Gen lethargic but arouses and responds appropriately  No jvd or bruits Chest clear anterior/ lateral, bilat CT's RRR no RG Abd soft ntnd no mass or ascites +bs GU normal male MS no joint effusions or deformity Ext  no LE or UE edema       RUE AVF +bruit     OP HD: [facility] 3h    F250     94kg   2K/2.5 Ca bath   RUE AVF   Summary: [name] is a/an 52 y.o. male with a past medical history notable for ESRD on HD admitted with MV, AV, and TV issues s/p repair/replacement.   Assessment/ Plan # Tricuspid, aortic, mitral valve insufficiencies - Status post Tri valve repair on 8/15.  Per cardiothoracic surgery  # Complete heart block - getting paced via epicardial wires. Per EP/ cards.   # ESRD - pt was too hypotensive for hemodialysis post-op. Started on CRRT 8/16. Cont CRRT until off of pressors.    # Volume/ shock - not much edema on exam but CXR's cont to look wet. Was on bipap last night. CVP"s high. Getting 75 cc/hr , will ^ to 80- 140 cc/hr net negative.    # Anemia esrd - Hb down to 7s post op. Transfuse as needed.     # MBD ckd - CCa and phos are in range. Continue to monitor.   # Vascular access - RUE AVF functioning well but unable to use due to shock.  Temporary dialysis catheter placed for CRRT.   [name] MD  [facility] 06/28/2023, 5:06 PM  Recent Labs  Lab 06/27/23 1647 06/28/23 0420 06/28/23 0756  HGB 8.1* 8.3* 9.5*  ALBUMIN 2.9* 2.9*  --   CALCIUM 9.0 9.2  --   PHOS 3.2 2.8  --   CREATININE 2.40* 2.37*  --   K 4.6 4.3 4.4   Recent Labs  Lab 06/26/23 0754  IRON 121  TIBC 133*  FERRITIN 1,444*   Inpatient medications:  sodium chloride   Intravenous Once    acyclovir  200 mg Oral BID   atorvastatin  10 mg Oral QHS   bisacodyl  10 mg Oral Daily   Or   bisacodyl  10 mg Rectal Daily   Chlorhexidine Gluconate Cloth  6 each Topical Daily   docusate  100 mg Oral Daily   feeding supplement (PROSource TF20)  60 mL Per Tube BID   heparin injection (subcutaneous)  5,000 Units Subcutaneous Q8H   insulin aspart  0-24 Units Subcutaneous Q4H   lactulose  10 g Per Tube BID   lidocaine-prilocaine  1 Application Topical Q M,W,F   multivitamin  1 tablet Oral QHS   omeprazole  40 mg Oral BID AC   mouth rinse  15 mL Mouth Rinse 4 times per day   sodium chloride flush  3 mL Intravenous Q12H     prismasol BGK 4/2.5 400 mL/hr at 06/27/23 1849    prismasol BGK 4/2.5 400 mL/hr at 06/27/23 1918   sodium chloride Stopped (06/23/23  1106)   sodium chloride     sodium chloride 10 mL/hr at 06/28/23 1400   albumin human Stopped (06/23/23 0040)   dexmedetomidine (PRECEDEX) IV infusion 0.4 mcg/kg/hr (06/28/23 1444)   epinephrine 10 mcg/min (06/28/23 1417)   feeding supplement (PIVOT 1.5 CAL)     lactated ringers     lactated ringers Stopped (06/28/23 0853)   prismasol BGK 4/2.5 1,500 mL/hr at 06/28/23 0451   sodium chloride, albumin human, heparin, metoprolol tartrate, ondansetron (ZOFRAN) IV, mouth rinse, oxyCODONE, phenol, sodium chloride, sodium chloride flush

## 2023-06-28 NOTE — Progress Notes (Signed)
Tele reviewed AV paced, no brady or evidence of LOC Chart reviewed, remains on HF)2 and pressor (though less) Remains too sick for PPM  Epicardial threshold unchanged on V lead this morning is 1.5 No underlying at 40bpm Output on V lead left at 77m 82bpm  Should the epicardial wires start to fail, will need temp transvenous wire until stable enough to PPM  EP will follow from afar for now  Francis Dowse, PA-C

## 2023-06-28 NOTE — Progress Notes (Signed)
      [address]       [place] [address]             [phone]      POD # 6 AVR, MV repair, TV repair  BP (!) 118/48 (BP Location: Other (Comment)) Comment (BP Location): L brachial aline  Pulse 82   Temp 98.8 F (37.1 C) (Oral)   Resp (!) 22   Ht 6\' 3"  (1.905 m)   Wt 97.2 kg   SpO2 98%   BMI 26.78 kg/m  CVP 20 Epi @ 10  Intake/Output Summary (Last 24 hours) at 06/28/2023 1745 Last data filed at 06/28/2023 1400 Gross per 24 hour  Intake 775.94 ml  Output 2531.6 ml  Net -1755.66 ml   Remains on CRRT K 4.3, creatinine 2.46 Hct 27, PLT 71K  Continue current Rx  Jimmie Rueter C. Dorris Fetch, MD [facility] [phone]

## 2023-06-28 NOTE — Progress Notes (Addendum)
Pt refused use of BIPAP for the evening.   06/28/23 2336  BiPAP/CPAP/SIPAP  Reason BIPAP/CPAP not in use Non-compliant  BiPAP/CPAP /SiPAP Vitals  Pulse Rate 82  Resp (!) 25  SpO2 100 %  MEWS Score/Color  MEWS Score 1  MEWS Score Color [place] Si

## 2023-06-28 NOTE — TOC Initial Note (Addendum)
Transition of Care [facility]) - Initial/Assessment Note    Patient Details  Name: [name] MRN: [id number] Date of Birth: 17-Apr-1971  Transition of Care [facility]) CM/SW Contact:    Elliot Cousin, RN Phone Number:  [phone] 06/28/2023, 8:59 AM  Clinical Narrative:   CM spoke to pt and gave permission to speak to aunt or father. Contacted pt's father, states pt lives alone but will like for pt to come and stay with him until he able to manage at home alone. Explained PT/OT will follow up with recommendation, IP rehab vs HH. Will continue to follow pt for dc needs.                 Expected Discharge Plan: Home w Home Health Services Barriers to Discharge: Continued Medical Work up   Patient Goals and CMS Choice            Expected Discharge Plan and Services   Discharge Planning Services: CM Consult   Living arrangements for the past 2 months: Apartment                                      Prior Living Arrangements/Services Living arrangements for the past 2 months: Apartment Lives with:: Self Patient language and need for interpreter reviewed:: No Do you feel safe going back to the place where you live?: Yes      Need for Family Participation in Patient Care: Yes (Comment) Care giver support system in place?: Yes (comment) Current home services: DME (CPAP, cane) Criminal Activity/Legal Involvement Pertinent to Current Situation/Hospitalization: No - Comment as needed  Activities of Daily Living      Permission Sought/Granted Permission sought to share information with : Case Manager, Family Supports, PCP Permission granted to share information with : Yes, Verbal Permission Granted  Share Information with NAME: Zixuan Macklin  Permission granted to share info w AGENCY: IP rehab, Home Health  Permission granted to share info w Relationship: father  Permission granted to share info w Contact Information: [phone]  Emotional Assessment Appearance::  Appears stated age Attitude/Demeanor/Rapport: Engaged Affect (typically observed): Accepting Orientation: : Oriented to Self, Oriented to Place, Oriented to  Time, Oriented to Situation   Psych Involvement: No (comment)  Admission diagnosis:  S/P tricuspid valve repair [Z98.890] Patient Active Problem List   Diagnosis Date Noted   S/P MVR (mitral valve repair) 06/23/2023   S/P AVR (aortic valve replacement) 06/23/2023   S/P Maze operation for atrial fibrillation 06/23/2023   Renal disease 06/23/2023   Tricuspid valve insufficiency 06/23/2023   S/P tricuspid valve repair 06/22/2023   Typical atrial flutter (HCC) 03/17/2023   Severe mitral regurgitation 11/24/2022   Rectal bleed 03/15/2020   Peripheral polyneuropathy 08/04/2016   Pericardial effusion    Renal transplant, 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 [place] [address] Phone: [phone] Fax: [phone]     Social Determinants of Health (SDOH) Social History: SDOH Screenings   Food Insecurity: Low Risk  (02/20/2023)  Received from [facility]  Housing: Low Risk  (06/28/2023)  Transportation Needs: No Transportation Needs (02/20/2023)   Received from [facility], [facility]  Utilities: Low Risk  (02/20/2023)   Received from [facility], [facility]  Social Connections: Unknown (03/18/2022)   Received from [facility]  Tobacco Use: Medium Risk (06/22/2023)   SDOH Interventions: Housing Interventions: Intervention Not Indicated   Readmission Risk  Interventions     No data to display

## 2023-06-28 NOTE — Procedures (Signed)
Cortrak  Tube Type:  Cortrak - 43 inches Tube Location:  Left nare Initial Placement:  Stomach Secured by: Bridle Technique Used to Measure Tube Placement:  Marking at nare/corner of mouth Cortrak Secured At:  70 cm   Cortrak Tube Team Note:  Consult received to place a Cortrak feeding tube.   X-ray is required, abdominal x-ray has been ordered by the Cortrak team. Please confirm tube placement before using the Cortrak tube.   If the tube becomes dislodged please keep the tube and contact the Cortrak team at [email or web address] for replacement.  If after hours and replacement cannot be delayed, place a NG tube and confirm placement with an abdominal x-ray.    Casey Campbell MS, RD, LDN Please refer to AMION for RD and/or RD on-call/weekend/after hours pager   

## 2023-06-28 NOTE — Progress Notes (Signed)
Nutrition Follow-up  DOCUMENTATION CODES:   Non-severe (moderate) malnutrition in context of chronic illness  INTERVENTION:   Tube Feeding via Cortrak:  Pivot 1.5 at 60 ml/hr Begin TF at rate of 20 ml/hr, titrate by 10 mL q 8 hours until goal rate of 60 ml/hr Pro-Source TF20 60 mL BID TF regimen at goal provides 2320 kcals, 175 g of protein and 1094 mL of free water   Monitor magnesium, potassium, and phosphorus BID for at least 3 days after TF initiation, MD to replete as needed, as pt is at risk for refeeding syndrome   Continue Renal MVI daily  Recommend increasing bowel regimen. Consider addition of reglan (if pt a candidate) if continues without BM and has difficulty tolerating initiation of TF   NUTRITION DIAGNOSIS:   Moderate Malnutrition related to chronic illness as evidenced by moderate muscle depletion, moderate fat depletion.  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  8/15 AVR, MVR, TVR by Dr. Leafy Ro 8/16 CRRT initiated 8/17 Extubated, CL diet 8/19 Chest tube inserted for effusion, BiPap overnight 8/20 No BM, abd xray with nonobstructive bowel gas pattern  Pt remains on CRRT, plan for Cortrak. Mental status was improving but later became agitated, combative.  Now in restraints on precedex Currently HFNC, wt 97.2 kg Epinephrine at 9, off vasopressin  Still no BM; bowel regimen includes scheduled dulcolax, colace. Noted possible addition of lactulose today but nothing ordered yet Abd xray yesterday with non obstructive bowel gas pattern  Outpatient EDW 94 kg   Labs: CBGs 79-119, sodium 139 (wdl), potassium 4.4 (wdl) Meds: dulcolax, colace, ss novolog, renal MVI   Diet Order:   Diet Order             DIET DYS 3 Room service appropriate? No; Fluid consistency: Thin  Diet effective now                   EDUCATION NEEDS:   Not appropriate for education at this time  Skin:  Skin Assessment: Reviewed RN Assessment (incision chest)  Last BM:  pta  Height:   Ht Readings from Last 1 Encounters:  06/22/23 6\' 3"  (1.905 m)    Weight:   Wt Readings from Last 1 Encounters:  06/28/23 97.2 kg    Ideal Body Weight:  89 kg  BMI:  Body mass index is 26.78 kg/m.  Estimated Nutritional Needs:   Kcal:  2300-2500 kcals  Protein:  160-195 g  Fluid:  1L plus UOP  [name] MS, RDN, LDN, CNSC Registered Dietitian 3 Clinical Nutrition RD Pager and On-Call Pager Number Located in [place]

## 2023-06-28 NOTE — Progress Notes (Signed)
NAME:  Natalio Bustillos, MRN:  [id number], DOB:  May 05, 1971, LOS: 6 ADMISSION DATE:  06/22/2023, CONSULTATION DATE: 06/22/2023 REFERRING MD: Leafy Ro - TCTS, CHIEF COMPLAINT: Postcardiac surgery  History of Present Illness:  This is a 52 year old gentleman, past medical history of anxiety depression, bipolar disease, congestive heart failure chronic diastolic heart failure kidney disease followed by renal transplant in 1998-07-22 repeat renal transplant in 07/22/14.  Patient underwent surgery today for severe mitral regurgitation severe tricuspid regurgitation and moderate aortic stenosis.  He also has atrial fibrillation received maze procedure.  He had a complex mitral valve repair a tricuspid valve annuloplasty and aortic valve replacement followed by full right and left maze procedure.  Pertinent Medical History:   Past Medical History:  Diagnosis Date   ADHD (attention deficit hyperactivity disorder)    Anemia    Anemia, chronic disease 07/13/2011   Anxiety    Arthritis    Bipolar 2 disorder (HCC) 08/18/2011   Diagnosed in July 22, 2010.    Bipolar disorder (HCC)    Blood dyscrasia    Nephrotic Anemia   Blood transfusion    Blood transfusion without reported diagnosis    Chronic diastolic CHF (congestive heart failure) (HCC)    Complication of anesthesia    Woke up intubated gets combative  afraid to be alone    Congestive heart failure (CHF) (HCC) 07/13/2011   Onset 07-22-05.  Followed by Dr. Eden Emms.  S/p cardiac catheterization in [facility] Dr. Anne Fu..  S/p cardiac catheterization in 07-22-13 by Nishan.  Echo 07/22/13.    Depression    Dysrhythmia    A. Fib 2023-07-23 with DCCV in May 2024   Erectile dysfunction    ESRD on hemodialysis (HCC) 07/13/2011   Glomerulonephritis at age 53, started HD in July 23, 1995.  Deceased donor renal transplant 1998-07-22 at [facility], then kidney failed and went back on HD in 2005/07/22.  L forearm AVF . s/p repeat renal transplant 10/2014 [facility].   GI bleed 11/01/2011   Rectal bleeding with  emesis/diarrhea    Heart murmur    Hemodialysis status (HCC)    Hydrocele, right 11/25/2014   Hypertension    Hypertensive emergency 01/06/2013   Hypoglycemia 01/11/2013   Influenza-like illness 12/18/2013   Mild ascending aorta dilatation (HCC)    NICM (nonischemic cardiomyopathy) (HCC)    Pericardial effusion    a. Mod by echo in 07-22-2013, similar to prior.   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 Deceased donor renal transplant 07-22-98 right and left renal transplant 10/2014.  Followed by [facility] transplant team; nephrologist is Dr. Lonna Cobb.    Respiratory failure with hypoxia (HCC) 01/06/2013   Shortness of breath 12/12/2012   Swollen testicle 07/13/2011   URI (upper respiratory infection) 01/25/2012    Significant Hospital Events: Including procedures, antibiotic start and stop dates in addition to other pertinent events   8/15 - AVR, MVR, TVR with TCTS ([place]). Remains on multiple pressors (NE, Epi, vaso). 8/16 - LIJ Trialysis catheter placed for CRRT.  8/17 extubated  8/18 - remains on CVVHD, 3 pressors   Interim History / Subjective:  Waking up a little more but still not as perky as he should be. Remains pacer-dependent Wore bipap for a little bit last night then got agitated and pulled it off.  Objective:  Blood pressure Marland Kitchen)  118/48, pulse 82, temperature 98.8 F (37.1 C), temperature source Oral, resp. rate (!) 26, height 6\' 3"  (1.905 m), weight 97.2 kg, SpO2 100%. CVP:  [14 mmHg-21 mmHg] 20 mmHg  Vent Mode: BIPAP FiO2 (%):  [40 %] 40 % Set Rate:  [12 bmp] 12 bmp PEEP:  [5 cmH20] 5 cmH20   Intake/Output Summary (Last 24 hours) at 06/28/2023 0840 Last data filed at 06/28/2023 0700 Gross per 24 hour  Intake 942.1 ml  Output 2184.2 ml  Net -1242.1  ml   Filed Weights   06/26/23 0500 06/27/23 0400 06/28/23 0500  Weight: 94.6 kg 96.5 kg 97.2 kg   Physical Examination: No distress on HFNC Answers questions, knows name and place but not date/time/situation RASS -1 Heart sounds regular, ext warm Moves to command Global anasarca stable  ABG mild retention Minimally elevated ammonia  Resolved Hospital Problem List:   Bloody OG output, OGT removed after extubation  ?UGIB  Question injury from TEE probe. - continue PPI  - no other signs of active bleeding   Assessment & Plan:   Mitral valve regurgitation, tricuspid regurgitation and aortic stenosis status post valvular repair x 3 Postoperative shock and vasoplegia ABLA, postoperative as expected Thrombocytopenia, ?chronic- stable Acute renal failure in the setting of hemodynamic insults on CKD History of kidney disease status post renal transplant Postoperative encephalopathy- biggest issue at present At risk malnutrition Postoperative mild ileus suspected Postoperative hypoxemic and hypercarbic respiratory failure  - CRRT with neg pull as tolerated by BP - Check SvO2, if low may ask CHF to see - Epi for MAP 65 (was on vaso/levo previously), now on 9 mcg/min - Encourage day/night cycles - I think will need cortrak and TF+lactulose today - BIPAP at bedtime and PRN  Best Practice (right click and "Reselect all SmartList Selections" daily)   Diet/type: clear liquid, advance as tolerated DVT prophylaxis:heparin ppx GI prophylaxis: PPI Lines: Central line Foley:  Yes, and it is still needed Code Status:  full code Last date of multidisciplinary goals of care discussion [Per Primary Team]  31 min cc time  Lorin Glass, MD [facility] 06/28/2023 8:40 AM

## 2023-06-29 ENCOUNTER — Inpatient Hospital Stay (HOSPITAL_COMMUNITY): Payer: Medicare HMO

## 2023-06-29 DIAGNOSIS — I5023 Acute on chronic systolic (congestive) heart failure: Secondary | ICD-10-CM

## 2023-06-29 DIAGNOSIS — I509 Heart failure, unspecified: Secondary | ICD-10-CM

## 2023-06-29 DIAGNOSIS — Z9889 Other specified postprocedural states: Secondary | ICD-10-CM | POA: Diagnosis not present

## 2023-06-29 LAB — ECHOCARDIOGRAM LIMITED
Area-P 1/2: 1.94 cm2
Height: 75 in
S' Lateral: 3.6 cm
Weight: 3400.38 oz

## 2023-06-29 LAB — CBC
HCT: 26.6 % — ABNORMAL LOW (ref 39.0–52.0)
HCT: 26.8 % — ABNORMAL LOW (ref 39.0–52.0)
Hemoglobin: 7.9 g/dL — ABNORMAL LOW (ref 13.0–17.0)
Hemoglobin: 8.1 g/dL — ABNORMAL LOW (ref 13.0–17.0)
MCH: 31.1 pg (ref 26.0–34.0)
MCH: 31.9 pg (ref 26.0–34.0)
MCHC: 29.7 g/dL — ABNORMAL LOW (ref 30.0–36.0)
MCHC: 30.2 g/dL (ref 30.0–36.0)
MCV: 104.7 fL — ABNORMAL HIGH (ref 80.0–100.0)
MCV: 105.5 fL — ABNORMAL HIGH (ref 80.0–100.0)
Platelets: 71 10*3/uL — ABNORMAL LOW (ref 150–400)
Platelets: 74 10*3/uL — ABNORMAL LOW (ref 150–400)
RBC: 2.54 MIL/uL — ABNORMAL LOW (ref 4.22–5.81)
RBC: 2.54 MIL/uL — ABNORMAL LOW (ref 4.22–5.81)
RDW: 18 % — ABNORMAL HIGH (ref 11.5–15.5)
RDW: 18 % — ABNORMAL HIGH (ref 11.5–15.5)
WBC: 6.1 10*3/uL (ref 4.0–10.5)
WBC: 5.7 10*3/uL (ref 4.0–10.5)
nRBC: 5.2 % — ABNORMAL HIGH (ref 0.0–0.2)
nRBC: 6.8 % — ABNORMAL HIGH (ref 0.0–0.2)

## 2023-06-29 LAB — GLUCOSE, CAPILLARY
Glucose-Capillary: 108 mg/dL — ABNORMAL HIGH (ref 70–99)
Glucose-Capillary: 124 mg/dL — ABNORMAL HIGH (ref 70–99)
Glucose-Capillary: 122 mg/dL — ABNORMAL HIGH (ref 70–99)
Glucose-Capillary: 129 mg/dL — ABNORMAL HIGH (ref 70–99)
Glucose-Capillary: 126 mg/dL — ABNORMAL HIGH (ref 70–99)

## 2023-06-29 LAB — HIV ANTIBODY (ROUTINE TESTING W REFLEX): HIV Screen 4th Generation wRfx: NONREACTIVE

## 2023-06-29 LAB — RENAL FUNCTION PANEL
Albumin: 2.8 g/dL — ABNORMAL LOW (ref 3.5–5.0)
Albumin: 2.8 g/dL — ABNORMAL LOW (ref 3.5–5.0)
Anion gap: 9 (ref 5–15)
Anion gap: 6 (ref 5–15)
BUN: 15 mg/dL (ref 6–20)
BUN: 16 mg/dL (ref 6–20)
CO2: 24 mmol/L (ref 22–32)
CO2: 26 mmol/L (ref 22–32)
Calcium: 9.1 mg/dL (ref 8.9–10.3)
Calcium: 9 mg/dL (ref 8.9–10.3)
Chloride: 101 mmol/L (ref 98–111)
Chloride: 104 mmol/L (ref 98–111)
Creatinine, Ser: 2.35 mg/dL — ABNORMAL HIGH (ref 0.61–1.24)
Creatinine, Ser: 2.4 mg/dL — ABNORMAL HIGH (ref 0.61–1.24)
GFR, Estimated: 33 mL/min — ABNORMAL LOW (ref >60)
GFR, Estimated: 32 mL/min — ABNORMAL LOW (ref >60)
Glucose, Bld: 127 mg/dL — ABNORMAL HIGH (ref 70–99)
Glucose, Bld: 132 mg/dL — ABNORMAL HIGH (ref 70–99)
Phosphorus: 2.6 mg/dL (ref 2.5–4.6)
Phosphorus: 2.7 mg/dL (ref 2.5–4.6)
Potassium: 4.2 mmol/L (ref 3.5–5.1)
Potassium: 4.4 mmol/L (ref 3.5–5.1)
Sodium: 134 mmol/L — ABNORMAL LOW (ref 135–145)
Sodium: 136 mmol/L (ref 135–145)

## 2023-06-29 LAB — CG4 I-STAT (LACTIC ACID): Lactic Acid, Venous: 0.8 mmol/L (ref 0.5–1.9)

## 2023-06-29 LAB — COOXEMETRY PANEL
Carboxyhemoglobin: 2 % — ABNORMAL HIGH (ref 0.5–1.5)
Carboxyhemoglobin: 2.1 % — ABNORMAL HIGH (ref 0.5–1.5)
Methemoglobin: <0.7 % (ref 0.0–1.5)
Methemoglobin: <0.7 % (ref 0.0–1.5)
O2 Saturation: 90.4 %
O2 Saturation: 99 %
Total hemoglobin: 8.6 g/dL — ABNORMAL LOW (ref 12.0–16.0)
Total hemoglobin: 8.7 g/dL — ABNORMAL LOW (ref 12.0–16.0)

## 2023-06-29 LAB — PROCALCITONIN: Procalcitonin: 7.71 ng/mL

## 2023-06-29 LAB — HEPATIC FUNCTION PANEL
ALT: 9 U/L (ref 0–44)
AST: 75 U/L — ABNORMAL HIGH (ref 15–41)
Albumin: 2.9 g/dL — ABNORMAL LOW (ref 3.5–5.0)
Alkaline Phosphatase: 99 U/L (ref 38–126)
Bilirubin, Direct: 0.3 mg/dL — ABNORMAL HIGH (ref 0.0–0.2)
Indirect Bilirubin: 0.7 mg/dL (ref 0.3–0.9)
Total Bilirubin: 1 mg/dL (ref 0.3–1.2)
Total Protein: 7.3 g/dL (ref 6.5–8.1)

## 2023-06-29 LAB — EXPECTORATED SPUTUM ASSESSMENT W GRAM STAIN, RFLX TO RESP C

## 2023-06-29 LAB — PHOSPHORUS: Phosphorus: 2.6 mg/dL (ref 2.5–4.6)

## 2023-06-29 LAB — MAGNESIUM
Magnesium: 2.5 mg/dL — ABNORMAL HIGH (ref 1.7–2.4)
Magnesium: 2.6 mg/dL — ABNORMAL HIGH (ref 1.7–2.4)

## 2023-06-29 LAB — TSH: TSH: 2.299 u[IU]/mL (ref 0.350–4.500)

## 2023-06-29 MED ORDER — VANCOMYCIN HCL 1250 MG/250ML IV SOLN
1250.0000 mg | INTRAVENOUS | Status: DC
  Administered 2023-06-30 – 2023-07-01 (×2): 1250 mg via INTRAVENOUS
  Filled 2023-06-29 (×3): qty 250

## 2023-06-29 MED ORDER — QUETIAPINE FUMARATE 25 MG PO TABS
25.0000 mg | ORAL_TABLET | Freq: Every day | ORAL | Status: DC
  Administered 2023-06-29: 25 mg
  Filled 2023-06-29: qty 1

## 2023-06-29 MED ORDER — ATORVASTATIN CALCIUM 10 MG PO TABS
10.0000 mg | ORAL_TABLET | Freq: Every day | ORAL | Status: DC
  Administered 2023-06-29 – 2023-07-29 (×31): 10 mg
  Filled 2023-06-29 (×31): qty 1

## 2023-06-29 MED ORDER — ACYCLOVIR 200 MG/5ML PO SUSP
200.0000 mg | Freq: Two times a day (BID) | ORAL | Status: DC
  Administered 2023-06-29 – 2023-07-17 (×37): 200 mg
  Filled 2023-06-29: qty 5
  Filled 2023-06-29 (×2): qty 10
  Filled 2023-06-29: qty 5
  Filled 2023-06-29 (×2): qty 10
  Filled 2023-06-29: qty 5
  Filled 2023-06-29: qty 10
  Filled 2023-06-29: qty 5
  Filled 2023-06-29: qty 10
  Filled 2023-06-29: qty 5
  Filled 2023-06-29 (×2): qty 10
  Filled 2023-06-29 (×2): qty 5
  Filled 2023-06-29: qty 10
  Filled 2023-06-29: qty 5
  Filled 2023-06-29: qty 10
  Filled 2023-06-29: qty 5
  Filled 2023-06-29: qty 10
  Filled 2023-06-29: qty 5
  Filled 2023-06-29 (×3): qty 10
  Filled 2023-06-29 (×3): qty 5
  Filled 2023-06-29: qty 10
  Filled 2023-06-29: qty 5
  Filled 2023-06-29 (×2): qty 10
  Filled 2023-06-29: qty 5
  Filled 2023-06-29 (×2): qty 10
  Filled 2023-06-29: qty 5
  Filled 2023-06-29 (×3): qty 10

## 2023-06-29 MED ORDER — PIPERACILLIN-TAZOBACTAM 3.375 G IVPB 30 MIN
3.3750 g | Freq: Four times a day (QID) | INTRAVENOUS | Status: DC
  Administered 2023-06-29 – 2023-07-02 (×12): 3.375 g via INTRAVENOUS
  Filled 2023-06-29 (×24): qty 50

## 2023-06-29 MED ORDER — PIPERACILLIN-TAZOBACTAM 3.375 G IVPB: 3.3750 g | Freq: Three times a day (TID) | INTRAVENOUS | Status: DC

## 2023-06-29 MED ORDER — VASOPRESSIN 20 UNITS/100 ML INFUSION FOR SHOCK
0.0400 [IU]/min | INTRAVENOUS | Status: DC
  Administered 2023-06-29 – 2023-07-01 (×8): 0.04 [IU]/min via INTRAVENOUS
  Administered 2023-07-02: 0.03 [IU]/min via INTRAVENOUS
  Filled 2023-06-29 (×9): qty 100

## 2023-06-29 MED ORDER — SODIUM CHLORIDE 0.9 % IV SOLN: INTRAVENOUS | Status: DC | PRN

## 2023-06-29 MED ORDER — MIDODRINE HCL 5 MG PO TABS
5.0000 mg | ORAL_TABLET | Freq: Three times a day (TID) | ORAL | Status: DC
  Administered 2023-06-29 (×3): 5 mg
  Filled 2023-06-29 (×3): qty 1

## 2023-06-29 MED ORDER — OMEPRAZOLE 2 MG/ML ORAL SUSPENSION
40.0000 mg | Freq: Two times a day (BID) | ORAL | Status: DC
  Filled 2023-06-29: qty 20

## 2023-06-29 MED ORDER — VANCOMYCIN HCL 2000 MG/400ML IV SOLN
2000.0000 mg | Freq: Once | INTRAVENOUS | Status: AC
  Administered 2023-06-29: 2000 mg via INTRAVENOUS
  Filled 2023-06-29: qty 400

## 2023-06-29 MED ORDER — DOCUSATE SODIUM 50 MG/5ML PO LIQD
100.0000 mg | Freq: Every day | ORAL | Status: DC
  Administered 2023-06-30: 100 mg
  Filled 2023-06-29: qty 10

## 2023-06-29 MED ORDER — RENA-VITE PO TABS
1.0000 | ORAL_TABLET | Freq: Every day | ORAL | Status: DC
  Administered 2023-06-29 – 2023-07-04 (×6): 1
  Filled 2023-06-29 (×6): qty 1

## 2023-06-29 MED ORDER — OXYCODONE HCL 5 MG/5ML PO SOLN
2.5000 mg | ORAL | Status: DC | PRN
  Administered 2023-06-30 – 2023-07-02 (×9): 2.5 mg
  Filled 2023-06-29 (×9): qty 5

## 2023-06-29 MED ORDER — LACTULOSE 10 GM/15ML PO SOLN
10.0000 g | Freq: Three times a day (TID) | ORAL | Status: DC
  Administered 2023-06-29 – 2023-06-30 (×6): 10 g
  Filled 2023-06-29 (×6): qty 15

## 2023-06-29 MED ORDER — VANCOMYCIN HCL 2000 MG/400ML IV SOLN
2000.0000 mg | Freq: Once | INTRAVENOUS | Status: DC
  Filled 2023-06-29: qty 400

## 2023-06-29 NOTE — Progress Notes (Signed)
PT Cancellation Note  Patient Details Name: [name] MRN: [id number] DOB: [birth year]   Cancelled Treatment:    Reason Eval/Treat Not Completed: Medical issues which prohibited therapy; RN reports confusion/agitation overnight and needing meds.  Reports weaker and likely not appropriate for PT today.  Will continue attempts.    [name] 06/29/2023, 9:09 AM Sheran Lawless, PT Acute Rehabilitation Services Office:[phone] 06/29/2023

## 2023-06-29 NOTE — Progress Notes (Signed)
Echocardiogram 2D Echocardiogram has been performed.  Paddy Walthall N Kellyann Ordway,RDCS 06/29/2023, 3:43 PM

## 2023-06-29 NOTE — Progress Notes (Signed)
OT Cancellation Note  Patient Details Name: Maddoxx Portnoy MRN: [id number] DOB: 1971-01-15   Cancelled Treatment:    Reason Eval/Treat Not Completed: Medical issues which prohibited therapy  Suzanna Obey 06/29/2023, 9:49 AM 06/29/2023  RP, OTR/L  Acute Rehabilitation Services  Office:  [phone]

## 2023-06-29 NOTE — Progress Notes (Signed)
[facility] Progress Note  Subjective: seen in ICU, I/O net neg 1.9 L yesterday. CVP remains 18-20 range.   Vitals:   06/29/23 1045 06/29/23 1100 06/29/23 1115 06/29/23 1130  BP:      Pulse: 82 82 82 82  Resp: (!) 28 (!) 22 (!) 27 (!) 30  Temp:      TempSrc:      SpO2: 100% 100% 100% 100%  Weight:      Height:        Exam: Gen lethargic , no distress No jvd or bruits Chest clear anterior/ lateral, bilat CT's RRR no RG Abd soft ntnd no mass or ascites +bs Ext  min LE edema      RUE AVF +bruit     OP HD: [place] triad 3h    F250     94kg   2K/2.5 Ca bath   RUE AVF   Summary: [name] is a/an 52 y.o. male with a past medical history notable for ESRD on HD admitted with MV, AV, and TV issues s/p repair/replacement.   Assessment/ Plan # Tricuspid, aortic, mitral valve insufficiencies - Status post Tri valve repair on 8/15.  Per cardiothoracic surgery  # Complete heart block - getting paced via epicardial wires. Per EP/ cards.   # ESRD - pt was too hypotensive for hemodialysis post-op. Started on CRRT 8/16. Cont CRRT until off of pressors.    # Volume/ shock - CVP remains high 18-20, CXR remains + for edema. ^ UF goal to 120- 170 cc/hr.    # Anemia esrd - Hb 7.5- 8.5, follow, transfuse as needed.     # MBD ckd - CCa and phos are in range. Continue to monitor.   # Vascular access - RUE AVF functioning well but unable to use due to shock.  Temporary dialysis catheter placed for CRRT.   [name] MD  [facility] 06/29/2023, 11:46 AM  Recent Labs  Lab 06/28/23 1621 06/29/23 0455  HGB 8.2* 7.9*  ALBUMIN 2.9* 2.8*  CALCIUM 9.4 9.1  PHOS 2.5  2.7 2.6  CREATININE 2.46* 2.35*  K 4.3 4.2   Recent Labs  Lab 06/26/23 0754  IRON 121  TIBC 133*  FERRITIN 1,444*   Inpatient medications:  acyclovir  200 mg Oral BID   atorvastatin  10 mg Oral QHS   bisacodyl  10 mg Oral Daily   Or   bisacodyl  10 mg Rectal Daily   Chlorhexidine Gluconate Cloth  6 each  Topical Daily   docusate  100 mg Oral Daily   feeding supplement (PROSource TF20)  60 mL Per Tube BID   heparin injection (subcutaneous)  5,000 Units Subcutaneous Q8H   insulin aspart  0-24 Units Subcutaneous Q4H   lactulose  10 g Per Tube TID   lidocaine-prilocaine  1 Application Topical Q M,W,F   midodrine  5 mg Per Tube TID   multivitamin  1 tablet Oral QHS   omeprazole  40 mg Oral BID AC   mouth rinse  15 mL Mouth Rinse 4 times per day   QUEtiapine  25 mg Per Tube QHS   sodium chloride flush  3 mL Intravenous Q12H     prismasol BGK 4/2.5 400 mL/hr at 06/29/23 0948    prismasol BGK 4/2.5 400 mL/hr at 06/29/23 0948   sodium chloride Stopped (06/29/23 1040)   albumin human Stopped (06/23/23 0040)   dexmedetomidine (PRECEDEX) IV infusion 0.2 mcg/kg/hr (06/29/23 1130)   epinephrine 13 mcg/min (06/29/23  1130)   feeding supplement (PIVOT 1.5 CAL) 30 mL/hr at 06/29/23 1130   lactated ringers 20 mL/hr at 06/29/23 1130   prismasol BGK 4/2.5 1,500 mL/hr at 06/29/23 0827   vasopressin 0.04 Units/min (06/29/23 1130)   sodium chloride, albumin human, heparin, metoprolol tartrate, ondansetron (ZOFRAN) IV, mouth rinse, oxyCODONE, phenol, sodium chloride, sodium chloride flush

## 2023-06-29 NOTE — Progress Notes (Signed)
SLP Cancellation Note  Patient Details Name: [name] MRN: [id number] DOB: 1971-05-28   Cancelled treatment:       Reason Eval/Treat Not Completed: Patient's level of consciousness. Pt not alert for POs at this point. Recommend resuming NPO status until arousal improves to prior status.    Buffie Herne, Riley Nearing 06/29/2023, 9:34 AM

## 2023-06-29 NOTE — Progress Notes (Signed)
AV paced on telemetry with no loss pf pacing or brady noted (outsid of testing) RV lead threshold is increased today 2.20mA Left at 36mA/82bpm  S/p complicated valvular surgery Clinical course doesn't seem to be making much headway On increased pressor need, precedex, CVVHD  HFO2/BIPAP  At this time, he remains to unstable for EP procedures If epicardials start to fail will need a temp venous wire placed  Will be best served long term with leadless pacer given HD, would require anesthesia. Might consider temp-perm  to the micra EP will follow  Francis Dowse, PA_C

## 2023-06-29 NOTE — Consult Note (Addendum)
Pharmacy Antibiotic Note  [name] is a 52 y.o. male being treated on 06/22/2023 with pneumonia and sepsis.  Pharmacy has been consulted for vancomycin and piperacillin-tazobactam dosing. Patient has remained afebrile but with concerns that CRRT could be masking any fever spikes. WBC count has remained stable. Sputum culture was ordered and obtained prior to start of antibiotics.  Plan: Vancomycin loading dose 2000 mg x1 followed by maintenance dose of 1250 mg every 24 hours. Will check a trough around the 4th maintenance dose. Piperacillin-tazobactam 3375 mg every 6 hours (30-minute infusions). Follow nephrology recommendations with CRRT/HD plans. Monitor WBC and fever curve for infection prognosis. Monitor sputum culture updates.  Height: 6\' 3"  (190.5 cm) Weight: 96.4 kg (212 lb 8.4 oz) IBW/kg (Calculated) : 84.5  Temp (24hrs), Avg:98.4 F (36.9 C), Min:98.2 F (36.8 C), Max:98.6 F (37 C)  Recent Labs  Lab 06/27/23 0422 06/27/23 1647 06/28/23 0420 06/28/23 1621 06/29/23 0455 06/29/23 1201  WBC 8.5 7.1 6.9 6.7 6.1  --   CREATININE 2.63* 2.40* 2.37* 2.46* 2.35*  --   LATICACIDVEN  --   --   --   --   --  0.8    Estimated Creatinine Clearance: 44.9 mL/min (A) (by C-G formula based on SCr of 2.35 mg/dL (H)).    Allergies  Allergen Reactions   Delacort [Hydrocortisone Base] Nausea Only   Benicar [Olmesartan]     Hair loss   Diltiazem Other (See Comments)    Tachycardia   Food Itching    bananas   Hydralazine Other (See Comments)    Tachycardia   Nitrates, Organic Other (See Comments)    Palpitations, headache    Antimicrobials this admission: Vancomycin 8/22 >>  Piperacillin-tazobactam 8/22 >>   Dose adjustments this admission:   Microbiology results: 8/22 Sputum: obtained > in progress   Thank you for allowing pharmacy to be a part of this patient's care.  Wilmer Floor 06/29/2023 4:36 PM

## 2023-06-29 NOTE — Progress Notes (Addendum)
      [address]       [place] [address]             [phone]      7 Days Post-Op Procedure(s) (LRB): TRICUSPID VALVE REPAIR MC3 Ring size 30 (N/A) MITRAL VALVE REPAIR (MVR) utilizing Simulis Band size 30mm (N/A) AORTIC VALVE REPLACEMENT (AVR) inspiris valve size 27 (N/A) MAZE (N/A) TRANSESOPHAGEAL ECHOCARDIOGRAM (N/A)  Subjective:  Patient sleeping this morning, did not wake up much to communicate with me.  Per documentation he refused bipap last night.  Objective: Vital signs in last 24 hours: Temp:  [98.2 F (36.8 C)-99 F (37.2 C)] 98.2 F (36.8 C) (08/22 0345) Pulse Rate:  [82] 82 (08/22 0730) Cardiac Rhythm: A-V Sequential paced (08/22 0400) Resp:  [19-33] 23 (08/22 0730) SpO2:  [92 %-100 %] 100 % (08/22 0730) Arterial Line BP: (87-147)/(38-64) 104/47 (08/22 0730) Weight:  [96.4 kg] 96.4 kg (08/22 0405)  Hemodynamic parameters for last 24 hours: CVP:  [0 mmHg-31 mmHg] 17 mmHg  Intake/Output from previous day: 08/21 0701 - 08/22 0700 In: 1377.7 [I.V.:1006.7; NG/GT:371] Out: 3365.9 [Chest Tube:250]  General appearance: asleep Heart: regular rate and rhythm and paced Lungs: diminished breath sounds bibasilar Abdomen: hyperactive BS, mild distention Extremities: edema + Wound: clean and dry  Lab Results: Recent Labs    06/28/23 1621 06/29/23 0455  WBC 6.7 6.1  HGB 8.2* 7.9*  HCT 26.7* 26.6*  PLT 71* 71*   BMET:  Recent Labs    06/28/23 1621 06/29/23 0455  NA 134* 134*  K 4.3 4.2  CL 101 101  CO2 26 24  GLUCOSE 97 127*  BUN 13 15  CREATININE 2.46* 2.35*  CALCIUM 9.4 9.1    PT/INR: No results for input(s): "LABPROT", "INR" in the last 72 hours. ABG    Component Value Date/Time   PHART 7.305 (L) 06/28/2023 0756   HCO3 26.3 06/28/2023 0756   TCO2 28 06/28/2023 0756   ACIDBASEDEF 1.0 06/28/2023 0756   O2SAT 91.6 06/28/2023 0907   CBG (last 3)  Recent Labs    06/28/23 2031 06/28/23 2328 06/29/23 0345  GLUCAP 92  93 108*    Assessment/Plan: S/P Procedure(s) (LRB): TRICUSPID VALVE REPAIR MC3 Ring size 30 (N/A) MITRAL VALVE REPAIR (MVR) utilizing Simulis Band size 30mm (N/A) AORTIC VALVE REPLACEMENT (AVR) inspiris valve size 27 (N/A) MAZE (N/A) TRANSESOPHAGEAL ECHOCARDIOGRAM (N/A)  CV- CHB, pacing.. remains on vaso @ 9... AHF following Pulm- refused BIPAP overnight, pigtail in place 250 cc output Renal- ESRD, currently requiring CRRT.. unable to perform HD with low pressures and drip requirement Expected post operative blood loss anemia- hgb @7 .9, will transfuse as needed GI- dysphagia, cleared for dysphagia 3 diet, however patient is not eating much, mental status is likely impacting this.. Cortrak in place with initiation of feedings... suspected mild ileus.Marland Kitchen great BS on exam..  Chronic thrombocytopenia- stable CBGs- stable, continue SSIP...not a diabetic   LOS: 7 days   Mark Dandy, PA-C 06/29/2023  Confusion overnight Remains on epi Pulling fluid today on CRRT  [name]

## 2023-06-29 NOTE — Progress Notes (Addendum)
NAME:  [name], MRN:  [id number], DOB:  1970-11-20, LOS: 7 ADMISSION DATE:  06/22/2023, CONSULTATION DATE: 06/22/2023 REFERRING MD: Leafy Ro - TCTS, CHIEF COMPLAINT: Postcardiac surgery  History of Present Illness:  This is a 52 year old gentleman, past medical history of anxiety depression, bipolar disease, congestive heart failure chronic diastolic heart failure kidney disease followed by renal transplant in 26-Jul-1998 repeat renal transplant in July 26, 2014.  Patient underwent surgery today for severe mitral regurgitation severe tricuspid regurgitation and moderate aortic stenosis.  He also has atrial fibrillation received maze procedure.  He had a complex mitral valve repair a tricuspid valve annuloplasty and aortic valve replacement followed by full right and left maze procedure.  Pertinent Medical History:   Past Medical History:  Diagnosis Date   ADHD (attention deficit hyperactivity disorder)    Anemia    Anemia, chronic disease 07/13/2011   Anxiety    Arthritis    Bipolar 2 disorder (HCC) 08/18/2011   Diagnosed in Jul 26, 2010.    Bipolar disorder (HCC)    Blood dyscrasia    Nephrotic Anemia   Blood transfusion    Blood transfusion without reported diagnosis    Chronic diastolic CHF (congestive heart failure) (HCC)    Complication of anesthesia    Woke up intubated gets combative  afraid to be alone    Congestive heart failure (CHF) (HCC) 07/13/2011   Onset 07/26/05.  Followed by Dr. Eden Emms.  S/p cardiac catheterization in [facility] Dr. Anne Fu..  S/p cardiac catheterization in 07/26/13 by Nishan.  Echo 2013-07-26.    Depression    Dysrhythmia    A. Fib July 27, 2023 with DCCV in May 2024   Erectile dysfunction    ESRD on hemodialysis (HCC) 07/13/2011   Glomerulonephritis at age 75, started HD in 1995-07-27.  Deceased donor renal transplant July 26, 1998 at [facility], then kidney failed and went back on HD in 07/26/05.  L forearm AVF . s/p repeat renal transplant 10/2014 [facility].   GI bleed 11/01/2011   Rectal bleeding with  emesis/diarrhea    Heart murmur    Hemodialysis status (HCC)    Hydrocele, right 11/25/2014   Hypertension    Hypertensive emergency 01/06/2013   Hypoglycemia 01/11/2013   Influenza-like illness 12/18/2013   Mild ascending aorta dilatation (HCC)    NICM (nonischemic cardiomyopathy) (HCC)    Pericardial effusion    a. Mod by echo in 2013/07/26, similar to prior.   Peripheral polyneuropathy 08/04/2016   Pneumonia    Pulmonary edema 01/06/2013   Pulmonary hypertension associated with end stage renal disease on dialysis [facility]) 07/13/2011   Renal transplant, status post 11/25/2014   Pt with Glomerulonephritis at age 22. Deceased donor renal transplant 07-26-1998 right and left renal transplant 10/2014.  Followed by [facility] transplant team; nephrologist is Dr. Lonna Cobb.    Respiratory failure with hypoxia (HCC) 01/06/2013   Shortness of breath 12/12/2012   Swollen testicle 07/13/2011   URI (upper respiratory infection) 01/25/2012    Significant Hospital Events: Including procedures, antibiotic start and stop dates in addition to other pertinent events   8/15 - AVR, MVR, TVR with TCTS ([facility]). Remains on multiple pressors (NE, Epi, vaso). 8/16 - LIJ Trialysis catheter placed for CRRT.  8/17 extubated  8/18 - remains on CVVHD, 3 pressors  8/19-8/20 improving pressor needs 8/21 agitated delirium; cortrak  Interim History / Subjective:  Active hallucinations Continues to offload fluid Pressor needs are up (epi from 9 to 14, question precedex effect)  Objective:  Blood pressure (!) 118/48, pulse 82, temperature 98.2  F (36.8 C), temperature source Oral, resp. rate (!) 26, height 6\' 3"  (1.905 m), weight 96.4 kg, SpO2 100%. CVP:  [0 mmHg-31 mmHg] 18 mmHg      Intake/Output Summary (Last 24 hours) at 06/29/2023 0906 Last data filed at 06/29/2023 0800 Gross per 24 hour  Intake 1388.94 ml  Output 3408.8 ml  Net -2019.86 ml   Filed Weights   06/27/23 0400 06/28/23 0500 06/29/23 0405   Weight: 96.5 kg 97.2 kg 96.4 kg   Physical Examination: No distress Minimal anasarca Aox1 Moves to command if prompted Protecting airway Abd soft, still no BM  Stable CBC/BMP, plts remain a bit low Coox 92 CVPs in 20s  Resolved Hospital Problem List:   Bloody OG output, OGT removed after extubation  ?UGIB  Question injury from TEE probe. - continue PPI  - no other signs of active bleeding   Assessment & Plan:   Mitral valve regurgitation, tricuspid regurgitation and aortic stenosis status post valvular repair x 3 Postoperative shock and vasoplegia- coox argues for a continued distributive pciture ABLA, postoperative as expected Thrombocytopenia, ?chronic- stable Postoperative junctional rhythm- now pacer dependent, eventual need for PPM Acute renal failure in the setting of hemodynamic insults on CKD History of kidney disease status post renal transplant Postoperative encephalopathy, ICU delirium- biggest issue at present At risk malnutrition Postoperative mild ileus suspected Postoperative hypoxemic and hypercarbic respiratory failure- suspected related to ongoing fluid shifts  - CRRT with neg pull as tolerated by BP - Will have heart failure weigh in but suspect this is all just vasoplegia and continued volume overload; unclear timeline on when this will improve but need to keep working toward euvolemia - Add vasopressin and midodrine to increase SVR, facilitate volume removal - Encourage day/night cycles, family visitations; wean precedex as able - Add at bedtime seroquel per tube - Continue TF + lactulose - BIPAP at bedtime and PRN - Eventual PPM - Overall just think needs time, just more protracted course than one would expect  Best Practice (right click and "Reselect all SmartList Selections" daily)   Diet/type: clear liquid, advance as tolerated; TF adjunct due to poor PO DVT prophylaxis:heparin ppx GI prophylaxis: PPI Lines: Central line Foley:  Yes, and  it is still needed Code Status:  full code Last date of multidisciplinary goals of care discussion [Per Primary Team]  34 min cc time  Lorin Glass, MD [facility] 06/29/2023 9:06 AM

## 2023-06-29 NOTE — Consult Note (Addendum)
Advanced Heart Failure Team Consult Note   Primary Physician: Donne Anon, MD PCP-Cardiologist:  Dr. Flora Lipps   Reason for Consultation: persistent post-operative shock, s/p triple valve surgery + MAZE and RV failure  HPI:    [name] is seen today for evaluation of persistent post-operative shock, s/p triple valve surgery + MAZE and RV failure, at the request of Dr. Katrinka Blazing, PCCM.   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 2D Echo 6/24 EF 55-60%, GIIIDD, mildly reduced RV.   Admitted for elective triple vale surgery + MAZE. Underwent aortic valve replacement + mitral and tricuspid valve repairs by Dr. Milinda Antis on 8/15. Intra-op TEE LVEF ~ 45%, RV mod-severely reduced, normally functioning bioprosthetic aortic valve w/ mean gradient 5 mmg, no perivalvular leak. Mitral valve, post annuloplasty ring, w/ trivial residual regurg, and mod MS w/ mean gradient 6-7 mmHg. Mild TR post annuloplasty ring.   Post-op course c/b persistent vasoplegia and CHB. Now POD #7 and remains on EPi 13 + VP 0.04. MAP 72. Co-ox in the 90s. On CRRT, currently pulling 200 cc/hr. CVP 22. He is extubated. On low dose precedex. Not following commands. No overt signs of infection/ possible sepsis other than Co-ox being high. Afebrile (though fevers could be masked by CRRT). WBC WNL, 6.1K.   Obtaining PCT, Lactic acid and 2D Echo     Review of Systems: [y] = yes, [ ]  = no Unable to obtain given pt condition   Home Medications Prior to Admission medications   Medication Sig Start Date End Date Taking? Authorizing Provider  acetaminophen (TYLENOL) 500 MG tablet Take 1,000 mg by mouth daily as needed for moderate pain.   Yes  [provider]  acyclovir (ZOVIRAX) 200 MG capsule Take 200 mg by mouth See admin instructions. Take 200 mg twice daily on non-dialysis days (Tues, Thurs, Sat, and Sun). Take 200 mg 3 times daily on dialysis days (Mon, Wed, and Fri) 09/08/22  Yes [provider]  amLODipine (NORVASC) 5 MG tablet Take 5 mg by mouth daily. 06/02/23 06/01/24 Yes [provider]  atorvastatin (LIPITOR) 10 MG tablet Take 1 tablet (10 mg total) by mouth daily. Patient taking differently: Take 10 mg by mouth at bedtime. 01/27/23 01/22/24 Yes O'Neal, Ronnald Ramp, MD  bismuth subsalicylate (PEPTO BISMOL) 262 MG chewable tablet Chew 524 mg by mouth as needed for indigestion.   Yes [provider]  calcium carbonate (TUMS EX) 750 MG chewable tablet Chew 1,500 mg by mouth See admin instructions. Take 1500 mg with each snack   Yes [provider]  carvedilol (COREG) 12.5 MG tablet Take 12.5 mg by mouth See admin instructions. Take 12.5 mg on Monday,Wednesday and Friday once a day in the morning 09/19/22  Yes [provider]  carvedilol (COREG) 25 MG tablet Take 1 tablet (25 mg total) by mouth 2 times daily with meals. Patient taking differently: Take 25 mg by mouth See admin instructions. Take 25 mg twice a day on Tuesday,Thursday, Saturday and Sunday 06/08/22  Yes O'Neal, Ronnald Ramp, MD  clobetasol ointment (TEMOVATE) 0.05 % Apply 1 Application topically 2 (two) times daily as needed (rash).   Yes [provider]  clonazePAM (KLONOPIN) 1 MG  tablet Take 1 mg by mouth daily as needed for anxiety.   Yes [provider]  fluconazole (DIFLUCAN) 100 MG tablet At first sign of thrush take 2 tablets by mouth on Day 1, then 1 tablet by mouth Days 2-7. 05/22/23  Yes [provider]  HYDROcodone-acetaminophen (NORCO) 7.5-325 MG tablet Take 1 tablet by mouth 3 (three) times daily as needed for severe pain or moderate pain. 04/07/21  Yes [provider]   lidocaine-prilocaine (EMLA) cream Apply 1 Application topically every Monday, Wednesday, and Friday. 1 hour and 30 minutes prior to Dialysis 05/01/20  Yes [provider]  loperamide (IMODIUM) 1 MG/5ML solution Take 3-6 mg by mouth as needed for diarrhea or loose stools.   Yes [provider]  sevelamer carbonate (RENVELA) 2.4 g PACK Take 4.8 g by mouth 3 (three) times daily with meals.   Yes [provider]    Past Medical History: Past Medical History:  Diagnosis Date   ADHD (attention deficit hyperactivity disorder)    Anemia    Anemia, chronic disease 07/13/2011   Anxiety    Arthritis    Bipolar 2 disorder (HCC) 08/18/2011   Diagnosed in 07/10/10.    Bipolar disorder (HCC)    Blood dyscrasia    Nephrotic Anemia   Blood transfusion    Blood transfusion without reported diagnosis    Chronic diastolic CHF (congestive heart failure) (HCC)    Complication of anesthesia    Woke up intubated gets combative  afraid to be alone    Congestive heart failure (CHF) (HCC) 07/13/2011   Onset Jul 10, 2005.  Followed by Dr. Eden Emms.  S/p cardiac catheterization in [facility] Dr. Anne Fu..  S/p cardiac catheterization in 2013/07/10 by Nishan.  Echo Jul 10, 2013.    Depression    Dysrhythmia    A. Fib 2023-07-11 with DCCV in May 2024   Erectile dysfunction    ESRD on hemodialysis (HCC) 07/13/2011   Glomerulonephritis at age 8, started HD in Jul 11, 1995.  Deceased donor renal transplant 07-10-1998 at [facility], then kidney failed and went back on HD in 2005-07-10.  L forearm AVF . s/p repeat renal transplant 10/2014 [facility].   GI bleed 11/01/2011   Rectal bleeding with emesis/diarrhea    Heart murmur    Hemodialysis status (HCC)    Hydrocele, right 11/25/2014   Hypertension    Hypertensive emergency 01/06/2013   Hypoglycemia 01/11/2013   Influenza-like illness 12/18/2013   Mild ascending aorta dilatation (HCC)    NICM (nonischemic cardiomyopathy) (HCC)    Pericardial effusion    a. Mod by echo in 07/10/2013, similar to  prior.   Peripheral polyneuropathy 08/04/2016   Pneumonia    Pulmonary edema 01/06/2013   Pulmonary hypertension associated with end stage renal disease on dialysis [facility] 07/13/2011   Renal transplant, status post 11/25/2014   Pt with Glomerulonephritis at age 35. Deceased donor renal transplant 1998-07-10 right and left renal transplant 10/2014.  Followed by [facility] transplant team; nephrologist is Dr. Lonna Cobb.    Respiratory failure with hypoxia (HCC) 01/06/2013   Shortness of breath 12/12/2012   Swollen testicle 07/13/2011   URI (upper respiratory infection) 01/25/2012    Past Surgical History: Past Surgical History:  Procedure Laterality Date   ANGIOPLASTY     AORTIC VALVE REPLACEMENT N/A 06/22/2023   Procedure: AORTIC VALVE REPLACEMENT (AVR) inspiris valve size 27;  Surgeon: Eugenio Hoes, MD;  Location: [facility] OR;  Service: Open Heart Surgery;  Laterality: N/A;   BUBBLE STUDY  09/05/2022   Procedure:  BUBBLE STUDY;  Surgeon: Sande Rives, MD;  Location: [facility] ENDOSCOPY;  Service: Cardiovascular;;   CARDIOVERSION N/A 04/13/2023   Procedure: CARDIOVERSION;  Surgeon: Jake Bathe, MD;  Location: [facility] INVASIVE CV LAB;  Service: Cardiovascular;  Laterality: N/A;   CHOLECYSTECTOMY     DIALYSIS FISTULA CREATION     EVALUATION UNDER ANESTHESIA WITH HEMORRHOIDECTOMY N/A 03/15/2020   Procedure: CONTROL OF ANAL BLEEDING;  Surgeon: Andria Meuse, MD;  Location: [facility] OR;  Service: General;  Laterality: N/A;   KIDNEY TRANSPLANT  10/17/2014   KNEE SURGERY Right    arthroscopic   LEFT HEART CATHETERIZATION WITH CORONARY ANGIOGRAM N/A 10/01/2013   Procedure: LEFT HEART CATHETERIZATION WITH CORONARY ANGIOGRAM;  Surgeon: Wendall Stade, MD;  Location: [facility] CATH LAB;  Service: Cardiovascular;  Laterality: N/A;   MAZE N/A 06/22/2023   Procedure: MAZE;  Surgeon: Eugenio Hoes, MD;  Location: [facility] OR;  Service: Open Heart Surgery;  Laterality: N/A;   MITRAL VALVE REPAIR N/A 06/22/2023   Procedure:  MITRAL VALVE REPAIR (MVR) utilizing Simulis Band size 30mm;  Surgeon: Eugenio Hoes, MD;  Location: [facility] OR;  Service: Open Heart Surgery;  Laterality: N/A;   MOUTH SURGERY     teeth removed   NEPHRECTOMY TRANSPLANTED ORGAN     RIGHT/LEFT HEART CATH AND CORONARY ANGIOGRAPHY N/A 11/24/2022   Procedure: RIGHT/LEFT HEART CATH AND CORONARY ANGIOGRAPHY;  Surgeon: Kathleene Hazel, MD;  Location: [facility] INVASIVE CV LAB;  Service: Cardiovascular;  Laterality: N/A;   TEE WITHOUT CARDIOVERSION N/A 09/05/2022   Procedure: TRANSESOPHAGEAL ECHOCARDIOGRAM (TEE);  Surgeon: Sande Rives, MD;  Location: [facility] ENDOSCOPY;  Service: Cardiovascular;  Laterality: N/A;   TEE WITHOUT CARDIOVERSION N/A 06/22/2023   Procedure: TRANSESOPHAGEAL ECHOCARDIOGRAM;  Surgeon: Eugenio Hoes, MD;  Location: [facility] OR;  Service: Open Heart Surgery;  Laterality: N/A;   TRICUSPID VALVE REPLACEMENT N/A 06/22/2023   Procedure: TRICUSPID VALVE REPAIR [facility]3 Ring size 30;  Surgeon: Eugenio Hoes, MD;  Location: [facility] OR;  Service: Open Heart Surgery;  Laterality: N/A;    Family History: Family History  Problem Relation Age of Onset   Arthritis Mother    Hypertension Mother    Diabetes Mother    Depression Mother    Arthritis Father    Hypertension Father    Diabetes Father    Depression Father     Social History: Social History   Socioeconomic History   Marital status: Single    Spouse name: Not on file   Number of children: 0   Years of education: 20+   Highest education level: Not on file  Occupational History   Occupation: Chef- fulltime  Tobacco Use   Smoking status: Former    Types: Cigars   Smokeless tobacco: Never  Vaping Use   Vaping status: Never Used  Substance and Sexual Activity   Alcohol use: Yes    Alcohol/week: 1.0 standard drink of alcohol    Types: 1 Standard drinks or equivalent per week    Comment: Stopped drinking after Kidney transplant in December 2015. Maybe 4 drinks per year.   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      Tobacco: none  Alcohol: none      Drugs: none      Exercise: cycling; 3 days per week.      Sexual activity: females only; no male encounters; Chlamydia age 89.  Total partner = 55.  Condoms.      Caffeine use: Soda sometimes   Social Determinants of Corporate investment banker Strain: Not on file  Food Insecurity: Low Risk  (02/20/2023)   [facility]   Food vital sign    Within the past 12 months, you worried that your food would run out before you got money to buy more: Never true    Within the past 12 months, the food you bought just didn't last and you didn't have money to get more. : Never true  Transportation Needs: No Transportation Needs (02/20/2023)   [facility]   Transportation    In the past 12 months, has lack of reliable transportation kept you from medical appointments, meetings, work or from getting things needed for daily living? : No  Physical Activity: Not on file  Stress: Not on file  Social Connections: Unknown (03/18/2022)   [facility]   Social Network    Social Network: Not on file    Allergies:  Allergies  Allergen Reactions   Delacort [Hydrocortisone Base] Nausea Only   Benicar [Olmesartan]     Hair loss   Diltiazem Other (See Comments)    Tachycardia   Food Itching    bananas   Hydralazine Other (See Comments)    Tachycardia   Nitrates, Organic Other (See Comments)    Palpitations, headache    Objective:    Vital Signs:   Temp:  [98.2 F (36.8 C)-99 F (37.2 C)] 98.2 F (36.8 C) (08/22 0800) Pulse Rate:  [82] 82 (08/22 1000) Resp:  [19-33] 25 (08/22 1000) SpO2:  [92 %-100 %] 100 %  (08/22 1000) Arterial Line BP: (87-147)/(38-64) 98/41 (08/22 1000) Weight:  [96.4 kg] 96.4 kg (08/22 0405) Last BM Date : (S)  (PTA (8/15))  Weight change: Filed Weights   06/27/23 0400 06/28/23 0500 06/29/23 0405  Weight: 96.5 kg 97.2 kg 96.4 kg    Intake/Output:   Intake/Output Summary (Last 24 hours) at 06/29/2023 1045 Last data filed at 06/29/2023 1000 Gross per 24 hour  Intake 1678.16 ml  Output 3682.9 ml  Net -2004.74 ml      Physical Exam    General:  extubated, no low dose sedation, not follow commands  HEENT: normal + cortrak  Neck: supple. JVP elevated to ear. + lt internal jugular HD cath, Carotids 2+ bilat; no bruits. No lymphadenopathy or thyromegaly appreciated.  Cor: PMI nondisplaced. Regular rate & rhythm. No rubs, gallops or murmurs. Lungs: clear Abdomen: soft, nontender, nondistended. No hepatosplenomegaly. No bruits or masses. Good bowel sounds. Extremities: no cyanosis, clubbing, rash, edema,warm  Neuro: sedated    Telemetry   A-V paced 80s   EKG    N/A   Labs   Basic Metabolic Panel: Recent Labs  Lab 06/26/23 0321 06/26/23 1642 06/27/23 0422 06/27/23 1647 06/28/23 0420 06/28/23 0756 06/28/23 1621 06/29/23 0455  NA 134*   < > 132* 134* 132* 139 134* 134*  K 4.7   < > 4.6 4.6 4.3 4.4 4.3 4.2  CL 102   < > 100 102 99  --  101 101  CO2 23   < > 24 25 25   --  26 24  GLUCOSE 127*   < > 97 120* 95  --  97 127*  BUN 18   < > 16 15 13   --  13 15  CREATININE 3.19*   < > 2.63* 2.40* 2.37*  --  2.46* 2.35*  CALCIUM 8.6*   < > 8.9 9.0 9.2  --  9.4 9.1  MG 2.4  --  2.4  --  2.5*  --  2.6* 2.5*  PHOS 3.9   < > 3.5 3.2 2.8  --  2.5  2.7 2.6   < > = values in this interval not displayed.    Liver Function Tests: Recent Labs  Lab 06/27/23 0422 06/27/23 1647 06/28/23 0420 06/28/23 1621 06/29/23 0455  ALBUMIN 3.0* 2.9* 2.9* 2.9* 2.8*   No results for input(s): "LIPASE", "AMYLASE" in the last 168 hours. Recent Labs  Lab 06/27/23 1032   AMMONIA 44*    CBC: Recent Labs  Lab 06/27/23 0422 06/27/23 1647 06/28/23 0420 06/28/23 0756 06/28/23 1621 06/29/23 0455  WBC 8.5 7.1 6.9  --  6.7 6.1  HGB 8.5* 8.1* 8.3* 9.5* 8.2* 7.9*  HCT 27.3* 26.3* 26.9* 28.0* 26.7* 26.6*  MCV 100.4* 104.0* 104.7*  --  105.5* 104.7*  PLT 92* 79* 78*  --  71* 71*    Cardiac Enzymes: No results for input(s): "CKTOTAL", "CKMB", "CKMBINDEX", "TROPONINI" in the last 168 hours.  BNP: BNP (last 3 results) No results for input(s): "BNP" in the last 8760 hours.  ProBNP (last 3 results) No results for input(s): "PROBNP" in the last 8760 hours.   CBG: Recent Labs  Lab 06/28/23 1635 06/28/23 2031 06/28/23 2328 06/29/23 0345 06/29/23 0739  GLUCAP 85 92 93 108* 124*    Coagulation Studies: No results for input(s): "LABPROT", "INR" in the last 72 hours.   Imaging   DG CHEST PORT 1 VIEW  Result Date: 06/29/2023 CLINICAL DATA:  Pleural effusion. EXAM: PORTABLE CHEST 1 VIEW COMPARISON:  06/27/2023. FINDINGS: Enlarged cardiac silhouette. Diffuse interstitial and alveolar opacities likely pulmonary edema. Diffuse pneumonia is not excluded. Left-sided pleural effusion. No pneumothorax identified. Right IJ CVC tip overlies proximal SVC. Left IJ CVC tip overlies mid SVC. Right-sided chest tube in place. Prosthetic mitral valve. Feeding tube extends below the diaphragm and off x-ray. IMPRESSION: Findings suggest interval worsening of CHF. Electronically Signed   By: Layla Maw M.D.   On: 06/29/2023 10:01   DG Abd Portable 1V  Result Date: 06/28/2023 CLINICAL DATA:  Encounter for feeding tube placement. EXAM: PORTABLE ABDOMEN - 1 VIEW COMPARISON:  June 27, 2023 FINDINGS: A nasogastric feeding tube is seen with its distal tip overlying the expected region of the gastric antrum. The bowel gas pattern is normal. Radiopaque surgical clips are seen overlying the right upper quadrant. No radio-opaque calculi or other significant radiographic  abnormality are seen. IMPRESSION: Nasogastric feeding tube positioning, as described above. Electronically Signed   By: Aram Candela M.D.   On: 06/28/2023 15:59     Medications:     Current Medications:  acyclovir  200 mg Oral BID   atorvastatin  10 mg Oral QHS   bisacodyl  10 mg Oral Daily   Or   bisacodyl  10 mg Rectal Daily   Chlorhexidine Gluconate Cloth  6 each Topical Daily   docusate  100 mg Oral Daily   feeding supplement (PROSource TF20)  60 mL Per Tube BID   heparin injection (subcutaneous)  5,000 Units Subcutaneous Q8H   insulin aspart  0-24 Units Subcutaneous  Q4H   lactulose  10 g Per Tube TID   lidocaine-prilocaine  1 Application Topical Q M,W,F   midodrine  5 mg Per Tube TID   multivitamin  1 tablet Oral QHS   omeprazole  40 mg Oral BID AC   mouth rinse  15 mL Mouth Rinse 4 times per day   QUEtiapine  25 mg Per Tube QHS   sodium chloride flush  3 mL Intravenous Q12H    Infusions:   prismasol BGK 4/2.5 400 mL/hr at 06/29/23 0948    prismasol BGK 4/2.5 400 mL/hr at 06/29/23 0948   sodium chloride 10 mL/hr at 06/29/23 1038   albumin human Stopped (06/23/23 0040)   dexmedetomidine (PRECEDEX) IV infusion 0.4 mcg/kg/hr (06/29/23 1026)   epinephrine 13 mcg/min (06/29/23 1000)   feeding supplement (PIVOT 1.5 CAL) 30 mL/hr at 06/29/23 1000   lactated ringers 20 mL/hr at 06/29/23 1030   prismasol BGK 4/2.5 1,500 mL/hr at 06/29/23 0827   vasopressin 0.04 Units/min (06/29/23 1016)      Patient Profile   52 y/o AAM w/ mildly reduced systolic heart failure (EF once 45-50% in 2013 but normal on subsequent echos) valvular heart disease including severe AS, 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 EF 55-60% w/ mild RV dysfunction by echo in 6/24.   Admitted for elective triple vale surgery + MAZE. S/p bioprosthetic aortic  valve replacement + mitral and tricuspid valve repairs. Post-op course c/b persistent vasoplagic syndrome.   Assessment/Plan   1. Post-op Vasopalegia  - POD #7, remains dependent on EPi support, currently on 13 mcg. Did not tolerate VP wean. VP added back today, currently on 0.04 mcg    - No overt signs of infection/ possible sepsis other than Co-ox being high. Afebrile (though fevers could be masked by CRRT). WBC WNL, 6.1K.  - Will check PCT and Lactic Acid. Will d/w CCM initiation of empiric abx   - will obtain complete echo  - ? High output HF 2/2 AV fistula  - May benefit from placement of Swan to help sort things out. He is on CRRT, could do at bedside. Will d/w MD  2.  Acute on Chronic Systolic Heart Failure w/ Prominent RV Dysfunction  - LVEF once 45-50% in 2013 but normal on subsequent echos. Pre-op TTE 6/25 EF 55-60%, GIIIDD (Restrictive) RV mildly reduced, mod PH w/ estimated RVSP 48 mmHg  - Intra-op TEE (while on 4 mcg of Epi) LVEF 45%, RV mod-severely reduced, stable AV prosthesis w/ normal gradient and stable valvuloplasty rings w/ mod residual MS, mean gradient 7 mmgH.   - CVP 22. Volume removal off CRRT limited by hypotension requiring increasing pressor support - will obtain 2D Echo  - may need RHC/ Swan placement as outlined above  - once recovered, consider amyloid w/u   3. Valvular Heart Disease  - pre-op diagnosis, severe AS, severe MR 2/2 MVP, severe TR - s/p bioprosthetic AVR, mitral and tricuspid valve repairs - stable gradients on intra-op TEE post repairs - check 2D echo today   4. Post-op CHB  - Has Epicardial pacing wires, remains pacer dependent - EP following. Will need PPM once recovered    5. ESRD on HD  - s/p failed kidney transplants in 1999 and again in 2015 - CRRT given current pressor requirements   6. H/o Atrial Fibrillation  - S/p  MAZE   7. Thrombocytopenia  - plts 71K  - likely 2/2 acute illness  - no gross bleeding, Hgb 7.9 today    Length of Stay: [address], PA-C  06/29/2023, 10:45 AM  Advanced Heart Failure Team Pager [phone] (M-F; 7a - 5p)  Please contact [facility] for night-coverage after hours (4p -7a ) and weekends on [email or web address]

## 2023-06-30 ENCOUNTER — Inpatient Hospital Stay (HOSPITAL_COMMUNITY): Payer: Medicare HMO

## 2023-06-30 DIAGNOSIS — Z9889 Other specified postprocedural states: Secondary | ICD-10-CM | POA: Diagnosis not present

## 2023-06-30 DIAGNOSIS — R4182 Altered mental status, unspecified: Secondary | ICD-10-CM | POA: Diagnosis not present

## 2023-06-30 DIAGNOSIS — I5023 Acute on chronic systolic (congestive) heart failure: Secondary | ICD-10-CM | POA: Diagnosis not present

## 2023-06-30 LAB — GLUCOSE, CAPILLARY
Glucose-Capillary: 123 mg/dL — ABNORMAL HIGH (ref 70–99)
Glucose-Capillary: 132 mg/dL — ABNORMAL HIGH (ref 70–99)
Glucose-Capillary: 132 mg/dL — ABNORMAL HIGH (ref 70–99)
Glucose-Capillary: 133 mg/dL — ABNORMAL HIGH (ref 70–99)
Glucose-Capillary: 141 mg/dL — ABNORMAL HIGH (ref 70–99)
Glucose-Capillary: 147 mg/dL — ABNORMAL HIGH (ref 70–99)
Glucose-Capillary: 86 mg/dL (ref 70–99)

## 2023-06-30 LAB — RENAL FUNCTION PANEL
Albumin: 2.8 g/dL — ABNORMAL LOW (ref 3.5–5.0)
Albumin: 2.6 g/dL — ABNORMAL LOW (ref 3.5–5.0)
Anion gap: 5 (ref 5–15)
Anion gap: 4 — ABNORMAL LOW (ref 5–15)
BUN: 20 mg/dL (ref 6–20)
BUN: 23 mg/dL — ABNORMAL HIGH (ref 6–20)
CO2: 26 mmol/L (ref 22–32)
CO2: 26 mmol/L (ref 22–32)
Calcium: 8.9 mg/dL (ref 8.9–10.3)
Calcium: 8.9 mg/dL (ref 8.9–10.3)
Chloride: 102 mmol/L (ref 98–111)
Chloride: 105 mmol/L (ref 98–111)
Creatinine, Ser: 2.39 mg/dL — ABNORMAL HIGH (ref 0.61–1.24)
Creatinine, Ser: 2.2 mg/dL — ABNORMAL HIGH (ref 0.61–1.24)
GFR, Estimated: 32 mL/min — ABNORMAL LOW (ref >60)
GFR, Estimated: 36 mL/min — ABNORMAL LOW (ref >60)
Glucose, Bld: 149 mg/dL — ABNORMAL HIGH (ref 70–99)
Glucose, Bld: 151 mg/dL — ABNORMAL HIGH (ref 70–99)
Phosphorus: 2.5 mg/dL (ref 2.5–4.6)
Phosphorus: 2.2 mg/dL — ABNORMAL LOW (ref 2.5–4.6)
Potassium: 4.3 mmol/L (ref 3.5–5.1)
Potassium: 4.1 mmol/L (ref 3.5–5.1)
Sodium: 133 mmol/L — ABNORMAL LOW (ref 135–145)
Sodium: 135 mmol/L (ref 135–145)

## 2023-06-30 LAB — CBC
HCT: 27.5 % — ABNORMAL LOW (ref 39.0–52.0)
HCT: 26.6 % — ABNORMAL LOW (ref 39.0–52.0)
Hemoglobin: 8.1 g/dL — ABNORMAL LOW (ref 13.0–17.0)
Hemoglobin: 8.1 g/dL — ABNORMAL LOW (ref 13.0–17.0)
MCH: 31.3 pg (ref 26.0–34.0)
MCH: 32.1 pg (ref 26.0–34.0)
MCHC: 29.5 g/dL — ABNORMAL LOW (ref 30.0–36.0)
MCHC: 30.5 g/dL (ref 30.0–36.0)
MCV: 106.2 fL — ABNORMAL HIGH (ref 80.0–100.0)
MCV: 105.6 fL — ABNORMAL HIGH (ref 80.0–100.0)
Platelets: 77 10*3/uL — ABNORMAL LOW (ref 150–400)
Platelets: 73 10*3/uL — ABNORMAL LOW (ref 150–400)
RBC: 2.59 MIL/uL — ABNORMAL LOW (ref 4.22–5.81)
RBC: 2.52 MIL/uL — ABNORMAL LOW (ref 4.22–5.81)
RDW: 18 % — ABNORMAL HIGH (ref 11.5–15.5)
RDW: 17.9 % — ABNORMAL HIGH (ref 11.5–15.5)
WBC: 5.8 10*3/uL (ref 4.0–10.5)
WBC: 5.7 10*3/uL (ref 4.0–10.5)
nRBC: 5.5 % — ABNORMAL HIGH (ref 0.0–0.2)
nRBC: 3.2 % — ABNORMAL HIGH (ref 0.0–0.2)

## 2023-06-30 LAB — PHOSPHORUS: Phosphorus: 2.4 mg/dL — ABNORMAL LOW (ref 2.5–4.6)

## 2023-06-30 LAB — COOXEMETRY PANEL
Carboxyhemoglobin: 1.7 % — ABNORMAL HIGH (ref 0.5–1.5)
Methemoglobin: <0.7 % (ref 0.0–1.5)
O2 Saturation: 88.5 %
Total hemoglobin: 9 g/dL — ABNORMAL LOW (ref 12.0–16.0)

## 2023-06-30 LAB — MAGNESIUM: Magnesium: 2.5 mg/dL — ABNORMAL HIGH (ref 1.7–2.4)

## 2023-06-30 MED ORDER — DOCUSATE SODIUM 50 MG/5ML PO LIQD
100.0000 mg | Freq: Two times a day (BID) | ORAL | Status: DC
  Administered 2023-06-30 – 2023-07-17 (×16): 100 mg
  Filled 2023-06-30 (×19): qty 10

## 2023-06-30 MED ORDER — MIDODRINE HCL 5 MG PO TABS
10.0000 mg | ORAL_TABLET | Freq: Three times a day (TID) | ORAL | Status: DC
  Administered 2023-06-30 – 2023-07-03 (×12): 10 mg
  Filled 2023-06-30 (×12): qty 2

## 2023-06-30 MED ORDER — POLYETHYLENE GLYCOL 3350 17 G PO PACK
17.0000 g | PACK | Freq: Two times a day (BID) | ORAL | Status: DC
  Administered 2023-06-30: 17 g
  Filled 2023-06-30 (×3): qty 1

## 2023-06-30 MED ORDER — QUETIAPINE FUMARATE 25 MG PO TABS
25.0000 mg | ORAL_TABLET | Freq: Two times a day (BID) | ORAL | Status: DC
  Administered 2023-06-30 – 2023-07-02 (×6): 25 mg
  Filled 2023-06-30 (×6): qty 1

## 2023-06-30 MED ORDER — PANTOPRAZOLE SODIUM 40 MG IV SOLR
40.0000 mg | Freq: Two times a day (BID) | INTRAVENOUS | Status: DC
  Administered 2023-06-30 – 2023-07-30 (×61): 40 mg via INTRAVENOUS
  Filled 2023-06-30 (×61): qty 10

## 2023-06-30 MED FILL — Calcium Chloride Inj 10%: INTRAVENOUS | Qty: 10 | Status: AC

## 2023-06-30 MED FILL — Heparin Sodium (Porcine) Inj 1000 Unit/ML: INTRAMUSCULAR | Qty: 10 | Status: AC

## 2023-06-30 MED FILL — Sodium Chloride IV Soln 0.9%: INTRAVENOUS | Qty: 1000 | Status: AC

## 2023-06-30 MED FILL — Sodium Bicarbonate IV Soln 8.4%: INTRAVENOUS | Qty: 150 | Status: AC

## 2023-06-30 MED FILL — Mannitol IV Soln 20%: INTRAVENOUS | Qty: 500 | Status: AC

## 2023-06-30 MED FILL — Electrolyte-R (PH 7.4) Solution: INTRAVENOUS | Qty: 4000 | Status: AC

## 2023-06-30 MED FILL — Sodium Chloride IV Soln 0.9%: INTRAVENOUS | Qty: 9000 | Status: AC

## 2023-06-30 NOTE — Plan of Care (Signed)
  Problem: Clinical Measurements: Goal: Ability to maintain clinical measurements within normal limits will improve Outcome: Progressing Goal: Will remain free from infection Outcome: Progressing Goal: Diagnostic test results will improve Outcome: Progressing Goal: Respiratory complications will improve Outcome: Progressing Goal: Cardiovascular complication will be avoided Outcome: Progressing   Problem: Activity: Goal: Risk for activity intolerance will decrease Outcome: Progressing   Problem: Nutrition: Goal: Adequate nutrition will be maintained Outcome: Progressing   Problem: Coping: Goal: Level of anxiety will decrease Outcome: Progressing   Problem: Elimination: Goal: Will not experience complications related to bowel motility Outcome: Progressing   Problem: Pain Managment: Goal: General experience of comfort will improve Outcome: Progressing   Problem: Safety: Goal: Ability to remain free from injury will improve Outcome: Progressing   Problem: Skin Integrity: Goal: Risk for impaired skin integrity will decrease Outcome: Progressing   Problem: Activity: Goal: Risk for activity intolerance will decrease Outcome: Progressing   Problem: Cardiac: Goal: Will achieve and/or maintain hemodynamic stability Outcome: Progressing   Problem: Respiratory: Goal: Respiratory status will improve Outcome: Progressing

## 2023-06-30 NOTE — Progress Notes (Signed)
[facility] Progress Note  Subjective: seen in ICU, I/O net neg 2.5 L yesterday. Wt's down to 95.7kg. CVP still high at 18-20. On vaso and epi gtts.   Vitals:   06/30/23 0830 06/30/23 0845 06/30/23 0900 06/30/23 0915  BP:      Pulse: 82 82 82 82  Resp: (!) 30 (!) 28 (!) 24 (!) 23  Temp:      TempSrc:      SpO2: 100% 100% 100% 100%  Weight:      Height:        Exam: Gen lethargic , no distress No jvd or bruits Chest clear anterior/ lateral, bilat CT's RRR no RG Abd soft ntnd no mass or ascites +bs Ext no LE edema, facial/ UE edema is better      RUE AVF +bruit     OP HD: [place] triad 3h    F250     94kg   2K/2.5 Ca bath   RUE AVF   Summary: [name] is a/an 52 y.o. male with a past medical history notable for ESRD on HD admitted with MV, AV, and TV issues s/p repair/replacement.   Assessment/ Plan # Tricuspid, aortic, mitral valve insufficiencies - Status post Tri valve repair on 8/15.  Per cardiothoracic surgery  # Complete heart block - getting paced via epicardial wires. Per EP/ cards.   # ESRD - pt was too hypotensive for hemodialysis post-op. Started on CRRT 8/16. Cont CRRT until off of pressors.    # Volume/ shock - CVP remains high 18-20, CXR remains + for edema on 8/22. ^ UF goal to 150 - 200 cc/hr.    # Anemia esrd - Hb 7.5- 8.5, follow, transfuse as needed.     # MBD ckd - CCa and phos are in range. Continue to monitor.   # Vascular access - RUE AVF functioning well but unable to use due to shock.  Temporary dialysis catheter placed for CRRT.   [name] MD  [facility] 06/30/2023, 10:04 AM  Recent Labs  Lab 06/29/23 1600 06/29/23 1710 06/30/23 0431  HGB  --  8.1* 8.1*  ALBUMIN 2.8*  --  2.8*  CALCIUM 9.0  --  8.9  PHOS 2.7 2.6 2.5  2.4*  CREATININE 2.40*  --  2.39*  K 4.4  --  4.3   Recent Labs  Lab 06/26/23 0754  IRON 121  TIBC 133*  FERRITIN 1,444*   Inpatient medications:  acyclovir  200 mg Per Tube BID   atorvastatin   10 mg Per Tube QHS   bisacodyl  10 mg Oral Daily   Or   bisacodyl  10 mg Rectal Daily   Chlorhexidine Gluconate Cloth  6 each Topical Daily   docusate  100 mg Per Tube Daily   feeding supplement (PROSource TF20)  60 mL Per Tube BID   heparin injection (subcutaneous)  5,000 Units Subcutaneous Q8H   insulin aspart  0-24 Units Subcutaneous Q4H   lactulose  10 g Per Tube TID   lidocaine-prilocaine  1 Application Topical Q M,W,F   midodrine  10 mg Per Tube TID   multivitamin  1 tablet Per Tube QHS   mouth rinse  15 mL Mouth Rinse 4 times per day   pantoprazole (PROTONIX) IV  40 mg Intravenous Q12H   QUEtiapine  25 mg Per Tube BID   sodium chloride flush  3 mL Intravenous Q12H     prismasol BGK 4/2.5 400 mL/hr at 06/29/23 2241  prismasol BGK 4/2.5 400 mL/hr at 06/29/23 2240   sodium chloride Stopped (06/30/23 0742)   albumin human Stopped (06/23/23 0040)   dexmedetomidine (PRECEDEX) IV infusion 0.4 mcg/kg/hr (06/30/23 1000)   epinephrine 12 mcg/min (06/30/23 1000)   feeding supplement (PIVOT 1.5 CAL) 60 mL/hr at 06/30/23 1000   lactated ringers 10 mL/hr at 06/30/23 1000   piperacillin-tazobactam Stopped (06/30/23 0550)   prismasol BGK 4/2.5 1,500 mL/hr at 06/30/23 0855   vancomycin     vasopressin 0.04 Units/min (06/30/23 1000)   sodium chloride, albumin human, heparin, ondansetron (ZOFRAN) IV, mouth rinse, oxyCODONE, phenol, sodium chloride, sodium chloride flush

## 2023-06-30 NOTE — Progress Notes (Cosign Needed)
Telemetry reviewed AV pacing, other then testing, no bradycardia or evidence of LOC Ventricular capture today w/epicardial system is 2mA, left at 33mA/82bpm At this time, he remains to unstable for EP procedures If epicardials start to fail will need a temp trans-venous wire placed   Will be best served long term with leadless pacer given HD, would require anesthesia. Might consider temp-perm as a bridge to the micra once able to tolerate procedures if needed EP will follow  Francis Dowse, PA-C

## 2023-06-30 NOTE — Progress Notes (Signed)
NAME:  [name], MRN:  [id number], DOB:  [birth year], LOS: 8 ADMISSION DATE:  06/22/2023, CONSULTATION DATE: 06/22/2023 REFERRING MD: Leafy Ro - TCTS, CHIEF COMPLAINT: Postcardiac surgery  History of Present Illness:  This is a 52 year old gentleman, past medical history of anxiety depression, bipolar disease, congestive heart failure chronic diastolic heart failure kidney disease followed by renal transplant in 1998/07/09 repeat renal transplant in 07/09/14.  Patient underwent surgery today for severe mitral regurgitation severe tricuspid regurgitation and moderate aortic stenosis.  He also has atrial fibrillation received maze procedure.  He had a complex mitral valve repair a tricuspid valve annuloplasty and aortic valve replacement followed by full right and left maze procedure.  Pertinent Medical History:   Past Medical History:  Diagnosis Date   ADHD (attention deficit hyperactivity disorder)    Anemia    Anemia, chronic disease 07/13/2011   Anxiety    Arthritis    Bipolar 2 disorder (HCC) 08/18/2011   Diagnosed in Jul 09, 2010.    Bipolar disorder (HCC)    Blood dyscrasia    Nephrotic Anemia   Blood transfusion    Blood transfusion without reported diagnosis    Chronic diastolic CHF (congestive heart failure) (HCC)    Complication of anesthesia    Woke up intubated gets combative  afraid to be alone    Congestive heart failure (CHF) (HCC) 07/13/2011   Onset 07-09-2005.  Followed by Dr. Eden Emms.  S/p cardiac catheterization in [facility] Dr. Anne Fu..  S/p cardiac catheterization in July 09, 2013 by Nishan.  Echo 07-09-2013.    Depression    Dysrhythmia    A. Fib 07-10-23 with DCCV in May 2024   Erectile dysfunction    ESRD on hemodialysis (HCC) 07/13/2011   Glomerulonephritis at age 58, started HD in 07-10-1995.  Deceased donor renal transplant 07/09/1998 at [facility], then kidney failed and went back on HD in 07-09-2005.  L forearm AVF . s/p repeat renal transplant 10/2014 [facility].   GI bleed 11/01/2011   Rectal bleeding with  emesis/diarrhea    Heart murmur    Hemodialysis status (HCC)    Hydrocele, right 11/25/2014   Hypertension    Hypertensive emergency 01/06/2013   Hypoglycemia 01/11/2013   Influenza-like illness 12/18/2013   Mild ascending aorta dilatation (HCC)    NICM (nonischemic cardiomyopathy) (HCC)    Pericardial effusion    a. Mod by echo in 09-Jul-2013, similar to prior.   Peripheral polyneuropathy 08/04/2016   Pneumonia    Pulmonary edema 01/06/2013   Pulmonary hypertension associated with end stage renal disease on dialysis [facility]) 07/13/2011   Renal transplant, status post 11/25/2014   Pt with Glomerulonephritis at age 87. Deceased donor renal transplant 07-09-1998 right and left renal transplant 10/2014.  Followed by [facility] transplant team; nephrologist is Dr. Lonna Cobb.    Respiratory failure with hypoxia (HCC) 01/06/2013   Shortness of breath 12/12/2012   Swollen testicle 07/13/2011   URI (upper respiratory infection) 01/25/2012    Significant Hospital Events: Including procedures, antibiotic start and stop dates in addition to other pertinent events   8/15 - AVR, MVR, TVR with TCTS ([facility]). Remains on multiple pressors (NE, Epi, vaso). 8/16 - LIJ Trialysis catheter placed for CRRT.  8/17 extubated  8/18 - remains on CVVHD, 3 pressors  8/19-8/20 improving pressor needs 8/21 agitated delirium; cortrak  Interim History / Subjective:  Remains confused. Still on pressors.   Objective:  Blood pressure (!) 118/48, pulse 82, temperature 97.9 F (36.6 C), temperature source Oral, resp. rate (!) 28, height 6'  3" (1.905 m), weight 95.7 kg, SpO2 100%. CVP:  [19 mmHg-25 mmHg] 22 mmHg      Intake/Output Summary (Last 24 hours) at 06/30/2023 0827 Last data filed at 06/30/2023 0800 Gross per 24 hour  Intake 4058.24 ml  Output 6637 ml  Net -2578.76 ml   Filed Weights   06/28/23 0500 06/29/23 0405 06/30/23 0431  Weight: 97.2 kg 96.4 kg 95.7 kg   Physical Examination: General: Lying  comfortably in bed.  HEENT: Small bore feeding tube in place. Chest: clear to auscultation. Sternotomy well apposed.  Right small bore chest tube in place draining sanguinous fluid. CV extremities warm.  2/6 holosystolic murmur. Abdomen soft nontender. GU: Anuric.  Ancillary Tests Personally Reviewed:    Creatinine: 2.39 Na 133 Plt: 77 Echocardiogram 8/22: Robust LV function.  RV function is reduced. Assessment & Plan:   Mitral valve regurgitation, tricuspid regurgitation and aortic stenosis status post valvular repair x 3 Postoperative distributive shock and vasoplegia and  component of RV dysfunction. - ABLA, postoperative as expected.  Active bleeding at present. Thrombocytopenia, ?chronic- stable Postoperative junctional rhythm- now pacer dependent, eventual need for PPM Acute renal failure in the setting of hemodynamic insults on CKD History of kidney disease status post renal transplant Postoperative encephalopathy, ICU delirium- biggest issue at present At risk malnutrition Postoperative mild ileus suspected Postoperative hypoxemic and hypercarbic respiratory failure- suspected related to ongoing fluid shifts History of bloody OG output - might have been TEE related, resolved.  Plan:  -Remains somnolent and delirious although now following some commands.  Start enteral sedation and attempt to wean dexmedetomidine off.  This will also help his blood pressure. -Continue vasopressin, titrate to keep MAP greater than 65 -Continue epinephrine for RV support, target normal SCV O2. -Continue to remove fluid as tolerated via CRRT.  Currently targeting 100-150 net ultrafiltration. -Continue tube feeds currently at goal. -Complete 7-day course of antibiotics. -Transfuse for hemoglobin less than 8.1. -Transfuse platelets if active bleeding.  Best Practice (right click and "Reselect all SmartList Selections" daily)   Diet/type: clear liquid, advance as tolerated; TF adjunct due to  poor PO DVT prophylaxis:heparin ppx GI prophylaxis: PPI Lines: Central line Foley:  Yes, and it is still needed Code Status:  full code Last date of multidisciplinary goals of care discussion [Per Primary Team]  CRITICAL CARE Performed by: Lynnell Catalan   Total critical care time: 40 minutes  Critical care time was exclusive of separately billable procedures and treating other patients.  Critical care was necessary to treat or prevent imminent or life-threatening deterioration.  Critical care was time spent personally by me on the following activities: development of treatment plan with patient and/or surrogate as well as nursing, discussions with consultants, evaluation of patient's response to treatment, examination of patient, obtaining history from patient or surrogate, ordering and performing treatments and interventions, ordering and review of laboratory studies, ordering and review of radiographic studies, pulse oximetry, re-evaluation of patient's condition and participation in multidisciplinary rounds.  Lynnell Catalan, MD [facility] ICU Physician [facility]  Pager: [phone] Mobile: [phone] After hours: [phone].

## 2023-06-30 NOTE — Progress Notes (Addendum)
TCTS DAILY ICU PROGRESS NOTE                   [address]            [place] [address]          [phone]   8 Days Post-Op Procedure(s) (LRB): TRICUSPID VALVE REPAIR MC3 Ring size 30 (N/A) MITRAL VALVE REPAIR (MVR) utilizing Simulis Band size 30mm (N/A) AORTIC VALVE REPLACEMENT (AVR) inspiris valve size 27 (N/A) MAZE (N/A) TRANSESOPHAGEAL ECHOCARDIOGRAM (N/A)  Total Length of Stay:  LOS: 8 days   Subjective: He opened eyes when I initially asked him to to;he did not follow commands after that. He did open his eyes again when nurses repositioned him.  Objective: Vital signs in last 24 hours: Temp:  [97.6 F (36.4 C)-98.6 F (37 C)] 97.9 F (36.6 C) (08/23 0400) Pulse Rate:  [81-83] 82 (08/23 0645) Cardiac Rhythm: A-V Sequential paced (08/23 0400) Resp:  [17-32] 27 (08/23 0645) SpO2:  [93 %-100 %] 100 % (08/23 0645) Arterial Line BP: (94-133)/(41-109) 107/49 (08/23 0645) Weight:  [95.7 kg] 95.7 kg (08/23 0431)  Filed Weights   06/28/23 0500 06/29/23 0405 06/30/23 0431  Weight: 97.2 kg 96.4 kg 95.7 kg    Weight change: -0.7 kg   Hemodynamic parameters for last 24 hours: CVP:  [0 mmHg-25 mmHg] 22 mmHg  Intake/Output from previous day: 08/22 0701 - 08/23 0700 In: 3853 [I.V.:1814.9; ZH/YQ:6578; IV Piggyback:550.1] Out: 6427 [Chest Tube:250]  Intake/Output this shift: Total I/O In: 2235.5 [I.V.:973.4; NG/GT:762; IV Piggyback:500.1] Out: 3514.3 [Chest Tube:80]  Current Meds: Scheduled Meds:  acyclovir  200 mg Per Tube BID   atorvastatin  10 mg Per Tube QHS   bisacodyl  10 mg Oral Daily   Or   bisacodyl  10 mg Rectal Daily   Chlorhexidine Gluconate Cloth  6 each Topical Daily   docusate  100 mg Per Tube Daily   feeding supplement (PROSource TF20)  60 mL Per Tube BID   heparin injection (subcutaneous)  5,000 Units Subcutaneous Q8H   insulin aspart  0-24 Units Subcutaneous Q4H   lactulose  10 g Per Tube TID   lidocaine-prilocaine  1  Application Topical Q M,W,F   midodrine  5 mg Per Tube TID   multivitamin  1 tablet Per Tube QHS   mouth rinse  15 mL Mouth Rinse 4 times per day   pantoprazole (PROTONIX) IV  40 mg Intravenous Q12H   QUEtiapine  25 mg Per Tube QHS   sodium chloride flush  3 mL Intravenous Q12H   Continuous Infusions:   prismasol BGK 4/2.5 400 mL/hr at 06/29/23 2241    prismasol BGK 4/2.5 400 mL/hr at 06/29/23 2240   sodium chloride 10 mL/hr at 06/30/23 0600   albumin human Stopped (06/23/23 0040)   dexmedetomidine (PRECEDEX) IV infusion 0.4 mcg/kg/hr (06/30/23 0600)   epinephrine 12 mcg/min (06/30/23 0600)   feeding supplement (PIVOT 1.5 CAL) 60 mL/hr at 06/30/23 0600   lactated ringers 20 mL/hr at 06/30/23 0600   piperacillin-tazobactam Stopped (06/30/23 0550)   prismasol BGK 4/2.5 1,500 mL/hr at 06/30/23 0522   vancomycin     vasopressin 0.04 Units/min (06/30/23 0600)   PRN Meds:.sodium chloride, albumin human, heparin, ondansetron (ZOFRAN) IV, mouth rinse, oxyCODONE, phenol, sodium chloride, sodium chloride flush  General appearance: no distress Neurologic: In mittens as he at times is agitated, pulling on lines Heart: Paced Lungs: Diminished bibasilar breath sounds Abdomen: Soft, rare bowel sounds this am  Extremities: SCDs in place Wound: Sternal wound is clean, dry, no sign of infection  Lab Results: CBC: Recent Labs    06/29/23 1710 06/30/23 0431  WBC 5.7 5.8  HGB 8.1* 8.1*  HCT 26.8* 27.5*  PLT 74* 77*   BMET:  Recent Labs    06/29/23 1600 06/30/23 0431  NA 136 133*  K 4.4 4.3  CL 104 102  CO2 26 26  GLUCOSE 132* 149*  BUN 16 20  CREATININE 2.40* 2.39*  CALCIUM 9.0 8.9    CMET: Lab Results  Component Value Date   WBC 5.8 06/30/2023   HGB 8.1 (L) 06/30/2023   HCT 27.5 (L) 06/30/2023   PLT 77 (L) 06/30/2023   GLUCOSE 149 (H) 06/30/2023   CHOL  11/04/2007    83        ATP III CLASSIFICATION:  <200     mg/dL   Desirable  161-096  mg/dL   Borderline High  >=045     mg/dL   High   TRIG 60 40/98/1191   HDL 20 (L) 11/04/2007   LDLCALC  11/04/2007    51        Total Cholesterol/HDL:CHD Risk Coronary Heart Disease Risk Table                     Men   Women  1/2 Average Risk   3.4   3.3   ALT 9 06/29/2023   AST 75 (H) 06/29/2023   NA 133 (L) 06/30/2023   K 4.3 06/30/2023   CL 102 06/30/2023   CREATININE 2.39 (H) 06/30/2023   BUN 20 06/30/2023   CO2 26 06/30/2023   TSH 2.299 06/29/2023   INR 2.8 (H) 06/22/2023   HGBA1C 5.3 06/20/2023      PT/INR: No results for input(s): "LABPROT", "INR" in the last 72 hours. Radiology: ECHOCARDIOGRAM LIMITED  Result Date: 06/29/2023    ECHOCARDIOGRAM LIMITED REPORT   Patient Name:   [name] Date of Exam: 06/29/2023 Medical Rec #:  [id number]  Height:       75.0 in Accession #:    [id number] Weight:       212.5 lb Date of Birth:  1971/06/09  BSA:          2.252 m Patient Age:    52 years   BP:           147/109 mmHg Patient Gender: M          HR:           82 bpm. Exam Location:  Inpatient Procedure: Limited Echo, Limited Color Doppler and Cardiac Doppler Indications:    Congestive Heart Failure  History:        Patient has prior history of Echocardiogram examinations, most                 recent 06/22/2023. CHF and Cardiomyopathy, Pulmonary HTN, Aortic                 Valve Disease and Mitral Valve Disease; Risk                 Factors:Hypertension and Former Smoker.                 Aortic Valve: 27 mm bioprosthetic valve is present in the aortic                 position. Procedure Date: 06/22/2023.  Mitral Valve: valve is present in the mitral position. Procedure                 Date: 06/22/23.  Sonographer:    Raeford Razor RDCS Referring Phys: [phone] ADITYA SABHARWAL IMPRESSIONS  1. Left ventricular ejection fraction, by estimation, is 60 to 65%. The left ventricle has normal function. The left ventricle has no regional wall motion abnormalities.  2. A small pericardial effusion is present. There is no  evidence of cardiac tamponade.  3. S/P neochords, 30 mm ring, and suture. The mitral valve has been repaired/replaced. Trivial mitral valve regurgitation. The mean mitral valve gradient is 9.0 mmHg with average heart rate of 82 bpm. There is a present in the mitral position. Procedure  Date: 06/22/23.  4. The tricuspid valve is has been repaired/replaced. The tricuspid valve is status post repair with an annuloplasty ring. Tricuspid valve regurgitation is mild.  5. The aortic valve has been repaired/replaced. There is a 27 mm bioprosthetic valve present in the aortic position. Procedure Date: 06/22/2023. FINDINGS  Left Ventricle: Left ventricular ejection fraction, by estimation, is 60 to 65%. The left ventricle has normal function. The left ventricle has no regional wall motion abnormalities. Pericardium: A small pericardial effusion is present. There is no evidence of cardiac tamponade. Mitral Valve: S/P neochords, 30 mm ring, and suture. The mitral valve has been repaired/replaced. Trivial mitral valve regurgitation. There is a present in the mitral position. Procedure Date: 06/22/23. MV peak gradient, 17.5 mmHg. The mean mitral valve gradient is 9.0 mmHg with average heart rate of 82 bpm. Tricuspid Valve: The tricuspid valve is has been repaired/replaced. Tricuspid valve regurgitation is mild. The tricuspid valve is status post repair with an annuloplasty ring. Aortic Valve: The aortic valve has been repaired/replaced. There is a 27 mm bioprosthetic valve present in the aortic position. Procedure Date: 06/22/2023. Pulmonic Valve: The pulmonic valve was grossly normal. Pulmonic valve regurgitation is mild to moderate. No evidence of pulmonic stenosis. Additional Comments: There is a small pleural effusion in both left and right lateral regions.  LEFT VENTRICLE PLAX 2D LVIDd:         5.40 cm LVIDs:         3.60 cm LV PW:         1.00 cm LV IVS:        1.00 cm LVOT diam:     2.10 cm LVOT Area:     3.46 cm  LEFT ATRIUM            Index LA diam:      4.40 cm 1.95 cm/m LA Vol (A4C): 84.1 ml 37.35 ml/m   AORTA Ao Root diam: 2.80 cm MITRAL VALVE                TRICUSPID VALVE MV Area (PHT): 1.94 cm     TV Area (PHT):  2.34 cm MV Peak grad:  17.5 mmHg    TV Peak grad:   8.5 mmHg MV Mean grad:  9.0 mmHg     TV Mean grad:   5.0 mmHg MV Vmax:       2.09 m/s     TV Vmax:        1.46 m/s MV Vmean:      135.0 cm/s   TV Vmean:       102.0 cm/s MV Decel Time: 392 msec     TV VTI:         0.29 msec MV E velocity: 184.00  cm/s                             SHUNTS                             Systemic Diam: 2.10 cm Jodelle Red MD Electronically signed by Jodelle Red MD Signature Date/Time: 06/29/2023/5:23:05 PM    Final      Assessment/Plan: S/P Procedure(s) (LRB): TRICUSPID VALVE REPAIR MC3 Ring size 30 (N/A) MITRAL VALVE REPAIR (MVR) utilizing Simulis Band size 30mm (N/A) AORTIC VALVE REPLACEMENT (AVR) inspiris valve size 27 (N/A) MAZE (N/A) TRANSESOPHAGEAL ECHOCARDIOGRAM (N/A) CV-CHB. He has epicardial wires. EP following;will need PPM at some point. Remains AV paced. He is on Epinephrine at 12 and Vasopressin 0.04 (restarted yesterday) this am. Also, on Midodrine 5 mg tid. Echo done yesterday showed LVEF 60-65%, trivial MR,mild TR. AHF now following-post op vasoplegia. Pulmonary-s/p pigtail 08/19 for right pleural effusion. Pigtail with 250 cc last 24 hours. Pigtail to suction. On 5 L via HFNC. Bi pap at bedtime and PRN. Pulmonary/CCM following. ESRD (failed renal transplant x 2)-Started on CRRT 08/16. Per nephrology, this is to continue until off pressors. He has temporary HD catheter in left internal jugular (RUE AVF but not able to use at the moment). Creatinine this am 2.39 Expected post op blood loss anemia/anemia of chronic disease-H and H this am stable at 8.1 and 27.5 5. GI-Dysphagia,moderate malnutrition. Cortrak with TFs. He likely has mild ileus. Laxatives as ordered 6. Chronic  thrombocytopenia-platelets this am 77,000 7. CBGs 126/123/132. Pre op HGA1C 5.3. No history of diabetes. Will continue accu checks SS PRN until tolerating po better 8. ID-on empiric Vancomycin and Zosyn 9. Neurologic-post op encephalopathy, ICU delirium. On Seroquel at night   Donielle Margaretann Loveless PA-C 06/30/2023 6:54 AM  Patient examined, images of yesterdays CXR and 2D echo reviewed. Metabolic encephalopathy with tube feeding, iv dex CVVH per renal,  Sternal incision clean, dry CXR clear with R pleural pigtail in place - leave for now Echo with good LV function, no pericardial effusion, valves all ok.IV heparin per pharmD Complete HB with EPWs- ventricular threshold is 2.5 ma. Battery charge is low and pacing box will be changed.Needs PPM by EP, following. Check CXR tomorrow  P Donata Clay MD

## 2023-06-30 NOTE — Progress Notes (Signed)
      [address]       [name] [address]             [phone]      POD # 8  Asleep currently  Per RN was following commands and was able to work some with PT  BP (!) 91/18   Pulse 82   Temp 98.3 F (36.8 C) (Oral)   Resp (!) 27   Ht 6\' 3"  (1.905 m)   Wt 95.7 kg   SpO2 100%   BMI 26.37 kg/m  BP 105/47 5l HFNC CVP 17  Epi @ 13, vasopressin 0.04   Intake/Output Summary (Last 24 hours) at 06/30/2023 1647 Last data filed at 06/30/2023 1600 Gross per 24 hour  Intake 4176.2 ml  Output 6819.6 ml  Net -2643.4 ml    On CRRT with -150 ml/ hr goal  Viviann Spare C. Dorris Fetch, MD [facility] [phone]

## 2023-06-30 NOTE — Progress Notes (Addendum)
Advanced Heart Failure Rounding Note  PCP-Cardiologist: None   Subjective:   8/15: Elective triple vale surgery + MAZE. Underwent aortic valve replacement + mitral and tricuspid valve repairs by Dr. Milinda Antis 8/22: AHF consulted for high-output HF with persistent post-op shock  Remains on CRRT   Echo yesterday EF 60-65%, no RWMA, small pericardial effusion present, MV repaired/replaced, trivial MR. TR repaired/replaced, mild TR. AV repaired/replaced.   Drowsy on precedex.   Objective:   Weight Range: 95.7 kg Body mass index is 26.37 kg/m.   Vital Signs:   Temp:  [97.6 F (36.4 C)-98.6 F (37 C)] 97.9 F (36.6 C) (08/23 0731) Pulse Rate:  [81-83] 82 (08/23 0700) Resp:  [17-32] 28 (08/23 0700) SpO2:  [93 %-100 %] 100 % (08/23 0700) Arterial Line BP: (94-133)/(41-109) 108/49 (08/23 0700) Weight:  [95.7 kg] 95.7 kg (08/23 0431) Last BM Date :  (PTA)  Weight change: Filed Weights   06/28/23 0500 06/29/23 0405 06/30/23 0431  Weight: 97.2 kg 96.4 kg 95.7 kg    Intake/Output:   Intake/Output Summary (Last 24 hours) at 06/30/2023 0820 Last data filed at 06/30/2023 0700 Gross per 24 hour  Intake 3914.92 ml  Output 6417.1 ml  Net -2502.18 ml   CVP 18 Physical Exam  General:  resting in bed HEENT: normal. + cortrak Neck: supple. JVD elevated. Carotids 2+ bilat; no bruits. No lymphadenopathy or thyromegaly appreciated. LIJ HD cath. Cor: PMI nondisplaced. Regular rate & rhythm. No rubs, gallops or murmurs. Lungs: clear, R pleural chest tube Abdomen: soft, nontender, nondistended. No hepatosplenomegaly. No bruits or masses. Good bowel sounds. Extremities: no cyanosis, clubbing, rash, edema. RUE fistula +bruit/thrill. L wrist a line Neuro: lightly sedated  Telemetry   Paced 80s (Personally reviewed)    EKG    No new EKG to review  Labs    CBC Recent Labs    06/29/23 1710 06/30/23 0431  WBC 5.7 5.8  HGB 8.1* 8.1*  HCT 26.8* 27.5*  MCV 105.5* 106.2*  PLT  74* 77*   Basic Metabolic Panel Recent Labs    40/98/11 1600 06/29/23 1710 06/30/23 0431  NA 136  --  133*  K 4.4  --  4.3  CL 104  --  102  CO2 26  --  26  GLUCOSE 132*  --  149*  BUN 16  --  20  CREATININE 2.40*  --  2.39*  CALCIUM 9.0  --  8.9  MG  --  2.6* 2.5*  PHOS 2.7 2.6 2.5  2.4*   Liver Function Tests Recent Labs    06/29/23 1425 06/29/23 1600 06/30/23 0431  AST 75*  --   --   ALT 9  --   --   ALKPHOS 99  --   --   BILITOT 1.0  --   --   PROT 7.3  --   --   ALBUMIN 2.9* 2.8* 2.8*   No results for input(s): "LIPASE", "AMYLASE" in the last 72 hours. Cardiac Enzymes No results for input(s): "CKTOTAL", "CKMB", "CKMBINDEX", "TROPONINI" in the last 72 hours.  BNP: BNP (last 3 results) No results for input(s): "BNP" in the last 8760 hours.  ProBNP (last 3 results) No results for input(s): "PROBNP" in the last 8760 hours.   D-Dimer No results for input(s): "DDIMER" in the last 72 hours. Hemoglobin A1C No results for input(s): "HGBA1C" in the last 72 hours. Fasting Lipid Panel No results for input(s): "CHOL", "HDL", "LDLCALC", "TRIG", "CHOLHDL", "LDLDIRECT" in the last  72 hours. Thyroid Function Tests Recent Labs    06/29/23 1425  TSH 2.299    Other results:   Imaging    ECHOCARDIOGRAM LIMITED  Result Date: 06/29/2023    ECHOCARDIOGRAM LIMITED REPORT   Patient Name:   [name] Date of Exam: 06/29/2023 Medical Rec #:  [id number]  Height:       75.0 in Accession #:    [id number] Weight:       212.5 lb Date of Birth:  20-Nov-1970  BSA:          2.252 m Patient Age:    52 years   BP:           147/109 mmHg Patient Gender: M          HR:           82 bpm. Exam Location:  Inpatient Procedure: Limited Echo, Limited Color Doppler and Cardiac Doppler Indications:    Congestive Heart Failure  History:        Patient has prior history of Echocardiogram examinations, most                 recent 06/22/2023. CHF and Cardiomyopathy, Pulmonary HTN, Aortic                  Valve Disease and Mitral Valve Disease; Risk                 Factors:Hypertension and Former Smoker.                 Aortic Valve: 27 mm bioprosthetic valve is present in the aortic                 position. Procedure Date: 06/22/2023.                 Mitral Valve: valve is present in the mitral position. Procedure                 Date: 06/22/23.  Sonographer:    Raeford Razor RDCS Referring Phys: [phone] ADITYA SABHARWAL IMPRESSIONS  1. Left ventricular ejection fraction, by estimation, is 60 to 65%. The left ventricle has normal function. The left ventricle has no regional wall motion abnormalities.  2. A small pericardial effusion is present. There is no evidence of cardiac tamponade.  3. S/P neochords, 30 mm ring, and suture. The mitral valve has been repaired/replaced. Trivial mitral valve regurgitation. The mean mitral valve gradient is 9.0 mmHg with average heart rate of 82 bpm. There is a present in the mitral position. Procedure  Date: 06/22/23.  4. The tricuspid valve is has been repaired/replaced. The tricuspid valve is status post repair with an annuloplasty ring. Tricuspid valve regurgitation is mild.  5. The aortic valve has been repaired/replaced. There is a 27 mm bioprosthetic valve present in the aortic position. Procedure Date: 06/22/2023. FINDINGS  Left Ventricle: Left ventricular ejection fraction, by estimation, is 60 to 65%. The left ventricle has normal function. The left ventricle has no regional wall motion abnormalities. Pericardium: A small pericardial effusion is present. There is no evidence of cardiac tamponade. Mitral Valve: S/P neochords, 30 mm ring, and suture. The mitral valve has been repaired/replaced. Trivial mitral valve regurgitation. There is a present in the mitral position. Procedure Date: 06/22/23. MV peak gradient, 17.5 mmHg. The mean mitral valve gradient is 9.0 mmHg with average heart rate of 82 bpm. Tricuspid Valve: The tricuspid valve is has been repaired/replaced. Tricuspid  valve regurgitation is mild. The  tricuspid valve is status post repair with an annuloplasty ring. Aortic Valve: The aortic valve has been repaired/replaced. There is a 27 mm bioprosthetic valve present in the aortic position. Procedure Date: 06/22/2023. Pulmonic Valve: The pulmonic valve was grossly normal. Pulmonic valve regurgitation is mild to moderate. No evidence of pulmonic stenosis. Additional Comments: There is a small pleural effusion in both left and right lateral regions.  LEFT VENTRICLE PLAX 2D LVIDd:         5.40 cm LVIDs:         3.60 cm LV PW:         1.00 cm LV IVS:        1.00 cm LVOT diam:     2.10 cm LVOT Area:     3.46 cm  LEFT ATRIUM           Index LA diam:      4.40 cm 1.95 cm/m LA Vol (A4C): 84.1 ml 37.35 ml/m   AORTA Ao Root diam: 2.80 cm MITRAL VALVE                TRICUSPID VALVE MV Area (PHT): 1.94 cm     TV Area (PHT):  2.34 cm MV Peak grad:  17.5 mmHg    TV Peak grad:   8.5 mmHg MV Mean grad:  9.0 mmHg     TV Mean grad:   5.0 mmHg MV Vmax:       2.09 m/s     TV Vmax:        1.46 m/s MV Vmean:      135.0 cm/s   TV Vmean:       102.0 cm/s MV Decel Time: 392 msec     TV VTI:         0.29 msec MV E velocity: 184.00 cm/s                             SHUNTS                             Systemic Diam: 2.10 cm Jodelle Red MD Electronically signed by Jodelle Red MD Signature Date/Time: 06/29/2023/5:23:05 PM    Final      Medications:     Scheduled Medications:  acyclovir  200 mg Per Tube BID   atorvastatin  10 mg Per Tube QHS   bisacodyl  10 mg Oral Daily   Or   bisacodyl  10 mg Rectal Daily   Chlorhexidine Gluconate Cloth  6 each Topical Daily   docusate  100 mg Per Tube Daily   feeding supplement (PROSource TF20)  60 mL Per Tube BID   heparin injection (subcutaneous)  5,000 Units Subcutaneous Q8H   insulin aspart  0-24 Units Subcutaneous Q4H   lactulose  10 g Per Tube TID   lidocaine-prilocaine  1 Application Topical Q M,W,F   midodrine  10 mg Per Tube  TID   multivitamin  1 tablet Per Tube QHS   mouth rinse  15 mL Mouth Rinse 4 times per day   pantoprazole (PROTONIX) IV  40 mg Intravenous Q12H   QUEtiapine  25 mg Per Tube QHS   sodium chloride flush  3 mL Intravenous Q12H    Infusions:   prismasol BGK 4/2.5 400 mL/hr at 06/29/23 2241    prismasol BGK 4/2.5 400 mL/hr at 06/29/23 2240   sodium chloride 10 mL/hr at  06/30/23 0700   albumin human Stopped (06/23/23 0040)   dexmedetomidine (PRECEDEX) IV infusion 0.4 mcg/kg/hr (06/30/23 0700)   epinephrine 12 mcg/min (06/30/23 0700)   feeding supplement (PIVOT 1.5 CAL) 60 mL/hr at 06/30/23 0700   lactated ringers 20 mL/hr at 06/30/23 0700   piperacillin-tazobactam Stopped (06/30/23 0550)   prismasol BGK 4/2.5 1,500 mL/hr at 06/30/23 0522   vancomycin     vasopressin 0.04 Units/min (06/30/23 0817)    PRN Medications: sodium chloride, albumin human, heparin, ondansetron (ZOFRAN) IV, mouth rinse, oxyCODONE, phenol, sodium chloride, sodium chloride flush    Patient Profile   52 y/o AAM w/ mildly reduced systolic heart failure (EF once 45-50% in 2013 but normal on subsequent echos) valvular heart disease including severe AS, 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 EF 55-60% w/ mild RV dysfunction by echo in 6/24.    Admitted for elective triple vale surgery + MAZE. S/p bioprosthetic aortic valve replacement + mitral and tricuspid valve repairs. Post-op course c/b persistent vasoplagic syndrome.   Assessment/Plan  1. Post-op Vasopalegia  - POD #8, remains dependent on EPi support, currently on 12 mcg. Did not tolerate VP wean. VP added back 8/22, currently on 0.04 mcg    - No overt signs of infection/ possible sepsis other than co-ox being high. Afebrile (though fevers could be masked by CRRT). WBC WNL, 5.8K.  - Will check PCT and Lactic Acid.  Will d/w CCM initiation of empiric abx   - Echo yesterday EF 60-65%, no RWMA, small pericardial effusion present, MV repaired/replaced, trivial MR. TR repaired/replaced, mild TR. AV repaired/replaced.  - Suspected high output HF 2/2 AV fistula  - May benefit from placement of Swan to help sort things out. He is on CRRT, could do at bedside. Continue to monitor for now. May need RHC   2.  Acute on Chronic Systolic Heart Failure w/ Prominent RV Dysfunction  - LVEF once 45-50% in 2013 but normal on subsequent echos. Pre-op TTE 6/25 EF 55-60%, GIIIDD (Restrictive) RV mildly reduced, mod PH w/ estimated RVSP 48 mmHg  - Intra-op TEE (while on 4 mcg of Epi) LVEF 45%, RV mod-severely reduced, stable AV prosthesis w/ normal gradient and stable valvuloplasty rings w/ mod residual MS, mean gradient 7 mmgH.   - CVP 18. Volume removal off CRRT limited by hypotension requiring increasing pressor support - Echo as in #1 - may need RHC/ Swan placement as outlined above  - Increase midodrine 5>10 mg TID - once recovered, consider amyloid w/u    3. Valvular Heart Disease  - pre-op diagnosis, severe AS, severe MR 2/2 MVP, severe TR - s/p bioprosthetic AVR, mitral and tricuspid valve repairs - stable gradients on intra-op TEE post repairs - stable on echo 8/22   4. Post-op CHB  - Has Epicardial pacing wires, remains pacer dependent - EP following. Will need PPM once recovered     5. ESRD on HD  - s/p failed kidney transplants in 1999 and again in 2015 - CRRT given current pressor requirements    6. H/o Atrial Fibrillation  - S/p MAZE    7. Thrombocytopenia  - plts 77K  - likely 2/2 acute illness  - no gross bleeding, Hgb 8.1 today   Length of Stay: 8  Alen Bleacher, NP  06/30/2023, 8:20 AM  Advanced Heart Failure Team Pager [phone] (M-F; 7a -  5p)  Please contact [facility] for night-coverage after hours (5p -7a ) and weekends on [email or web address]

## 2023-06-30 NOTE — Progress Notes (Signed)
Physical Therapy Treatment Patient Details Name: [name] MRN: [id number] DOB: December 18, 1970 Today's Date: 06/30/2023   History of Present Illness Pt is a 52 y.o. male admitted 06/22/23 for same day planned tricuspid valve repair, mitral valve repair, aortic valve replacement, maze, TEE. Post-op course complicated by CHB, shock, AKI, respiratory failure. CRRT initiated 8/16. ETT 8/15-8/17. PMH includes CHF, HTN, NICM, polyneuropathy, ESRD on HD (s/p renal transplant 1999, 2015), bipolar, ADHD, anxiety, depression.    PT Comments  Patient with limited progress due to medical issues.  Still requiring pressors, on CRRT, R chest tube and new coretrak.  He was able to participate with bed level exercises seated in chair position today with max verbal cues due to lethargy.  Family in the room and supportive.  Hopeful for continued progress to allow transition to inpatient rehab prior to d/c home.     If plan is discharge home, recommend the following: A lot of help with walking and/or transfers;A lot of help with bathing/dressing/bathroom;Assistance with cooking/housework;Assist for transportation;Help with stairs or ramp for entrance   Can travel by private vehicle        Equipment Recommendations  Other (comment) (TBA)    Recommendations for Other Services       Precautions / Restrictions Precautions Precautions: Fall Precaution Comments: CRRT, external pacer, chest tube, etc. Restrictions Other Position/Activity Restrictions: sternal prec     Mobility  Bed Mobility Overal bed mobility: Needs Assistance                  Transfers                   General transfer comment: scooting up to [facility] with +2 A then up in chair posistion total A with bed features    Ambulation/Gait                   Stairs             Wheelchair Mobility     Tilt Bed    Modified Rankin (Stroke Patients Only)       Balance Overall balance assessment: Needs  assistance   Sitting balance-Leahy Scale: Poor Sitting balance - Comments: pillow under L side to keep in midline, difficult with keeping head upright during session                                    Cognition Arousal: Lethargic, Suspect due to medications Behavior During Therapy: Flat affect Overall Cognitive Status: Impaired/Different from baseline                                 General Comments: needs increased time, repeition to follow commands, eyes not opening much fully during session, difficulty staying awake        Exercises General Exercises - Upper Extremity Shoulder Flexion: AAROM, Seated, Both (8 reps in chair position) Elbow Extension: AAROM, Both, Seated (8 reps in chair position) Wrist Flexion: AAROM, Both, Seated (8 reps in chair position) General Exercises - Lower Extremity Ankle Circles/Pumps: AROM, 10 reps, Seated, Both (in chair position) Short Arc Quad: AROM, Both, Seated (8 reps) Heel Slides: AAROM, Seated, Both (8 reps) Hip ABduction/ADduction: Strengthening, Both, 10 reps, Seated (hip adductor squeezes with 3 sec hold) Other Exercises Other Exercises: cervical AAROM rotation, lateral flexion and extension; work to keep head upright while  in chair position pt needing support for many of the exercises, but allowed to stay semi upright for him to keep working on lifting his head. RN Aware    General Comments General comments (skin integrity, edema, etc.): on CRRT, RN reports had just increased Precedex a little due to discomfort, but lowered it again during session; mother and father in the room and supportive      Pertinent Vitals/Pain Pain Assessment Pain Assessment: Faces Faces Pain Scale: Hurts little more Pain Location: grimacing at times with movement Pain Descriptors / Indicators: Discomfort, Grimacing Pain Intervention(s): Monitored during session, Repositioned, Limited activity within patient's tolerance    Home  Living                          Prior Function            PT Goals (current goals can now be found in the care plan section) Progress towards PT goals: Not progressing toward goals - comment    Frequency    Min 1X/week      PT Plan      Co-evaluation              AM-PAC PT "6 Clicks" Mobility   Outcome Measure  Help needed turning from your back to your side while in a flat bed without using bedrails?: Total Help needed moving from lying on your back to sitting on the side of a flat bed without using bedrails?: Total Help needed moving to and from a bed to a chair (including a wheelchair)?: Total Help needed standing up from a chair using your arms (e.g., wheelchair or bedside chair)?: Total Help needed to walk in hospital room?: Total Help needed climbing 3-5 steps with a railing? : Total 6 Click Score: 6    End of Session   Activity Tolerance: Patient limited by fatigue Patient left: in bed   PT Visit Diagnosis: Other abnormalities of gait and mobility (R26.89);Muscle weakness (generalized) (M62.81)     Time: 1610-9604 PT Time Calculation (min) (ACUTE ONLY): 21 min  Charges:    $Therapeutic Activity: 8-22 mins PT General Charges $$ ACUTE PT VISIT: 1 Visit                     Sheran Lawless, PT Acute Rehabilitation Services [phone] 06/30/2023    [name] 06/30/2023, 5:15 PM

## 2023-06-30 NOTE — Progress Notes (Signed)
Nutrition Follow-up  DOCUMENTATION CODES:   Non-severe (moderate) malnutrition in context of chronic illness  INTERVENTION:   Tube Feeding via Cortrak:  Pivot 1.5 at 60 ml/hr Pro-Source TF20 60 mL BID TF regimen at goal provides 2320 kcals, 175 g of protein and 1094 mL of free water  Continue Renal MVI   NUTRITION DIAGNOSIS:   Moderate Malnutrition related to chronic illness as evidenced by moderate muscle depletion, moderate fat depletion.  Being addressed via TF  GOAL:   Patient will meet greater than or equal to 90% of their needs  Met via TF  MONITOR:   PO intake, TF tolerance, Labs, Weight trends  REASON FOR ASSESSMENT:   Consult Assessment of nutrition requirement/status  ASSESSMENT:   52 y/o male with h/o glomerulonephritis (age 19) s/p renal transplant (1999) and right and left renal transplants (2015), ESRD on HD (>20 yrs), HTN, bipolar 2 disorder, PTSD, ADHD, anxiety, depression, GERD, OSA, NICM, thrombocytopenia, pulmonary hypertension, afib s/p DCCV (03/2019), CHF, severe mitral and tricuspid valve regurgitation and moderate aortic stenosis s/p mitral valve repair, tricuspid valve annuloplasty, aortic valve replacement and full right and left maze procedure 8/16.  8/15 AVR, MVR, TVR by Dr. Leafy Ro 8/16 CRRT initiated 8/17 Extubated, CL diet 8/19 Chest tube inserted for effusion, BiPap overnight 8/20 No BM, abd xray with nonobstructive bowel gas pattern 8/21 Cortrak placed, TF initiated 8/22 NPO due to mental status, TF continue  Remains confused, remains on pressors (vasopressin at 0.04, epinephrine at 13). Remains on CRRT with net UF target of 100-150 mL/hr  Pivot 1.5 at 60 ml/hr via Cortrak; tolerating thus far  No BM since admission (8 days), noted bowel regimen escalated yesterday. BS active, abd distended but soft  Current wt 95.7 kg; admit wt 93.5 kg, highest wt 97.2 kg. Anuric currently with CRRT removed 6 L in 24 hours, net negative in 2 L    Chest tube with 280 mL in 24 hours  Labs: sodium 133 (L), phosphorus 2.4 (L), potassium 4.3 (wdl), magnesium 2.5 (H) Meds: dulcolax, ss novolog, colace, lactulose, renal MVI  Diet Order:   Diet Order             Diet NPO time specified Except for: Ice Chips  Diet effective now                   EDUCATION NEEDS:   Not appropriate for education at this time  Skin:  Skin Assessment: Reviewed RN Assessment (incision chest)  Last BM:  no BM x 8 days  Height:   Ht Readings from Last 1 Encounters:  06/22/23 6\' 3"  (1.905 m)    Weight:   Wt Readings from Last 1 Encounters:  06/30/23 95.7 kg    Ideal Body Weight:  89 kg  BMI:  Body mass index is 26.37 kg/m.  Estimated Nutritional Needs:   Kcal:  2300-2500 kcals  Protein:  160-195 g  Fluid:  1L plus UOP  Mark Starcher MS, RDN, LDN, CNSC Registered Dietitian 3 Clinical Nutrition RD Pager and On-Call Pager Number Located in [place]

## 2023-06-30 NOTE — Plan of Care (Signed)
  Problem: Clinical Measurements: Goal: Will remain free from infection Outcome: Progressing Goal: Diagnostic test results will improve Outcome: Progressing Goal: Respiratory complications will improve Outcome: Progressing   Problem: Nutrition: Goal: Adequate nutrition will be maintained Outcome: Progressing   Problem: Safety: Goal: Ability to remain free from injury will improve Outcome: Progressing   Problem: Skin Integrity: Goal: Risk for impaired skin integrity will decrease Outcome: Progressing

## 2023-07-01 ENCOUNTER — Inpatient Hospital Stay (HOSPITAL_COMMUNITY): Payer: Medicare HMO

## 2023-07-01 DIAGNOSIS — R4182 Altered mental status, unspecified: Secondary | ICD-10-CM | POA: Diagnosis not present

## 2023-07-01 DIAGNOSIS — I442 Atrioventricular block, complete: Secondary | ICD-10-CM | POA: Diagnosis not present

## 2023-07-01 DIAGNOSIS — Z9889 Other specified postprocedural states: Secondary | ICD-10-CM | POA: Diagnosis not present

## 2023-07-01 LAB — ALT: ALT: 9 U/L (ref 0–44)

## 2023-07-01 LAB — RENAL FUNCTION PANEL
Albumin: 2.6 g/dL — ABNORMAL LOW (ref 3.5–5.0)
Albumin: 2.6 g/dL — ABNORMAL LOW (ref 3.5–5.0)
Anion gap: 10 (ref 5–15)
Anion gap: 9 (ref 5–15)
BUN: 24 mg/dL — ABNORMAL HIGH (ref 6–20)
BUN: 26 mg/dL — ABNORMAL HIGH (ref 6–20)
CO2: 22 mmol/L (ref 22–32)
CO2: 25 mmol/L (ref 22–32)
Calcium: 9.3 mg/dL (ref 8.9–10.3)
Calcium: 9.7 mg/dL (ref 8.9–10.3)
Chloride: 102 mmol/L (ref 98–111)
Chloride: 100 mmol/L (ref 98–111)
Creatinine, Ser: 2.2 mg/dL — ABNORMAL HIGH (ref 0.61–1.24)
Creatinine, Ser: 2.22 mg/dL — ABNORMAL HIGH (ref 0.61–1.24)
GFR, Estimated: 36 mL/min — ABNORMAL LOW (ref >60)
GFR, Estimated: 35 mL/min — ABNORMAL LOW (ref >60)
Glucose, Bld: 161 mg/dL — ABNORMAL HIGH (ref 70–99)
Glucose, Bld: 140 mg/dL — ABNORMAL HIGH (ref 70–99)
Phosphorus: 2.1 mg/dL — ABNORMAL LOW (ref 2.5–4.6)
Phosphorus: 2.4 mg/dL — ABNORMAL LOW (ref 2.5–4.6)
Potassium: 4.1 mmol/L (ref 3.5–5.1)
Potassium: 4.2 mmol/L (ref 3.5–5.1)
Sodium: 134 mmol/L — ABNORMAL LOW (ref 135–145)
Sodium: 134 mmol/L — ABNORMAL LOW (ref 135–145)

## 2023-07-01 LAB — ALKALINE PHOSPHATASE: Alkaline Phosphatase: 102 U/L (ref 38–126)

## 2023-07-01 LAB — CBC
HCT: 26.9 % — ABNORMAL LOW (ref 39.0–52.0)
Hemoglobin: 8 g/dL — ABNORMAL LOW (ref 13.0–17.0)
MCH: 32 pg (ref 26.0–34.0)
MCHC: 29.7 g/dL — ABNORMAL LOW (ref 30.0–36.0)
MCV: 107.6 fL — ABNORMAL HIGH (ref 80.0–100.0)
Platelets: 74 10*3/uL — ABNORMAL LOW (ref 150–400)
RBC: 2.5 MIL/uL — ABNORMAL LOW (ref 4.22–5.81)
RDW: 17.9 % — ABNORMAL HIGH (ref 11.5–15.5)
WBC: 5.3 10*3/uL (ref 4.0–10.5)
nRBC: 1.7 % — ABNORMAL HIGH (ref 0.0–0.2)

## 2023-07-01 LAB — PROCALCITONIN: Procalcitonin: 4.63 ng/mL

## 2023-07-01 LAB — BILIRUBIN, DIRECT: Bilirubin, Direct: 0.4 mg/dL — ABNORMAL HIGH (ref 0.0–0.2)

## 2023-07-01 LAB — GLUCOSE, CAPILLARY
Glucose-Capillary: 101 mg/dL — ABNORMAL HIGH (ref 70–99)
Glucose-Capillary: 122 mg/dL — ABNORMAL HIGH (ref 70–99)
Glucose-Capillary: 123 mg/dL — ABNORMAL HIGH (ref 70–99)
Glucose-Capillary: 136 mg/dL — ABNORMAL HIGH (ref 70–99)
Glucose-Capillary: 148 mg/dL — ABNORMAL HIGH (ref 70–99)

## 2023-07-01 LAB — MAGNESIUM: Magnesium: 2.5 mg/dL — ABNORMAL HIGH (ref 1.7–2.4)

## 2023-07-01 LAB — AST: AST: 58 U/L — ABNORMAL HIGH (ref 15–41)

## 2023-07-01 LAB — BILIRUBIN, TOTAL: Total Bilirubin: 1.2 mg/dL (ref 0.3–1.2)

## 2023-07-01 LAB — PROTEIN, TOTAL: Total Protein: 7.5 g/dL (ref 6.5–8.1)

## 2023-07-01 MED ORDER — GUAIFENESIN 100 MG/5ML PO LIQD
15.0000 mL | Freq: Two times a day (BID) | ORAL | Status: DC
  Administered 2023-07-01 – 2023-07-03 (×6): 15 mL via ORAL
  Filled 2023-07-01 (×7): qty 15

## 2023-07-01 MED ORDER — TRAMADOL HCL 50 MG PO TABS
25.0000 mg | ORAL_TABLET | Freq: Four times a day (QID) | ORAL | Status: DC | PRN
  Administered 2023-07-01 – 2023-07-08 (×9): 25 mg
  Filled 2023-07-01 (×10): qty 1

## 2023-07-01 NOTE — Progress Notes (Signed)
      [address]       [name] [address]             [phone]      Sleeping currently, has been more alert, but Rn noted some anxiety  BP (!) 91/18   Pulse 80   Temp 97.9 F (36.6 C) (Oral)   Resp (!) 25   Ht 6\' 3"  (1.905 m)   Wt 87.4 kg   SpO2 94%   BMI 24.08 kg/m  BP 110/45   Intake/Output Summary (Last 24 hours) at 07/01/2023 1932 Last data filed at 07/01/2023 1800 Gross per 24 hour  Intake 3815.8 ml  Output 5866.2 ml  Net -2050.4 ml   Continue current care  Shannan Slinker C. Dorris Fetch, MD [facility] [phone]

## 2023-07-01 NOTE — Progress Notes (Signed)
   Rounding Note    Patient Name: [name] Date of Encounter: 07/01/2023  [facility] Cardiologist: None   Subjective   NAEO. Remains on Epi @ 10 and Vaso at 0.4  Vital Signs    Vitals:   07/01/23 0630 07/01/23 0645 07/01/23 0700 07/01/23 0743  BP:      Pulse: 82 82 82   Resp: (!) 27 (!) 28 (!) 27   Temp:    97.9 F (36.6 C)  TempSrc:    Oral  SpO2: 100% 100% 100%   Weight:      Height:        Intake/Output Summary (Last 24 hours) at 07/01/2023 0755 Last data filed at 07/01/2023 0700 Gross per 24 hour  Intake 3982.14 ml  Output 5966.2 ml  Net -1984.06 ml      07/01/2023    4:26 AM 06/30/2023    4:31 AM 06/29/2023    4:05 AM  Last 3 Weights  Weight (lbs) 192 lb 10.9 oz 210 lb 15.7 oz 212 lb 8.4 oz  Weight (kg) 87.4 kg 95.7 kg 96.4 kg      Telemetry    V paced - Personally Reviewed  ECG    Personally Reviewed  Physical Exam   GEN: Somnolent.  Cardiac: RRR, no murmurs, rubs, or gallops.   Assessment & Plan     #CHB S/p AVR/Tvr/Mvr/MAZE/LAAL He will require PPM prior to discharge. He is currently not stable enough to undergo EP procedures. Epi wires with stable threshold today (2mV). If this increases, he will need TVP via RIJ as a bridge to permanent pacemaker.   #ESRD CVVHD    Sheria Lang T. Lalla Brothers, MD, [facility], [facility] Cardiac Electrophysiology

## 2023-07-01 NOTE — Progress Notes (Signed)
NAME:  [name], MRN:  [id number], DOB:  05-28-1971, LOS: 9 ADMISSION DATE:  06/22/2023, CONSULTATION DATE: 06/22/2023 REFERRING MD: Leafy Ro - TCTS, CHIEF COMPLAINT: Postcardiac surgery  History of Present Illness:  This is a 52 year old gentleman, past medical history of anxiety depression, bipolar disease, congestive heart failure chronic diastolic heart failure kidney disease followed by renal transplant in Aug 07, 1998 repeat renal transplant in 08/07/14.  Patient underwent surgery today for severe mitral regurgitation severe tricuspid regurgitation and moderate aortic stenosis.  He also has atrial fibrillation received maze procedure.  He had a complex mitral valve repair a tricuspid valve annuloplasty and aortic valve replacement followed by full right and left maze procedure.  Pertinent Medical History:   Past Medical History:  Diagnosis Date   ADHD (attention deficit hyperactivity disorder)    Anemia    Anemia, chronic disease 07/13/2011   Anxiety    Arthritis    Bipolar 2 disorder (HCC) 08/18/2011   Diagnosed in 08/07/2010.    Bipolar disorder (HCC)    Blood dyscrasia    Nephrotic Anemia   Blood transfusion    Blood transfusion without reported diagnosis    Chronic diastolic CHF (congestive heart failure) (HCC)    Complication of anesthesia    Woke up intubated gets combative  afraid to be alone    Congestive heart failure (CHF) (HCC) 07/13/2011   Onset 2005-08-07.  Followed by Dr. Eden Emms.  S/p cardiac catheterization in [facility] Dr. Anne Fu..  S/p cardiac catheterization in 08/07/2013 by Nishan.  Echo 08-07-13.    Depression    Dysrhythmia    A. Fib 08-08-2023 with DCCV in May 2024   Erectile dysfunction    ESRD on hemodialysis (HCC) 07/13/2011   Glomerulonephritis at age 43, started HD in 1995-08-08.  Deceased donor renal transplant August 07, 1998 at [facility], then kidney failed and went back on HD in 08/07/2005.  L forearm AVF . s/p repeat renal transplant 10/2014 [facility].   GI bleed 11/01/2011   Rectal bleeding with  emesis/diarrhea    Heart murmur    Hemodialysis status (HCC)    Hydrocele, right 11/25/2014   Hypertension    Hypertensive emergency 01/06/2013   Hypoglycemia 01/11/2013   Influenza-like illness 12/18/2013   Mild ascending aorta dilatation (HCC)    NICM (nonischemic cardiomyopathy) (HCC)    Pericardial effusion    a. Mod by echo in 08/07/13, similar to prior.   Peripheral polyneuropathy 08/04/2016   Pneumonia    Pulmonary edema 01/06/2013   Pulmonary hypertension associated with end stage renal disease on dialysis [facility]) 07/13/2011   Renal transplant, status post 11/25/2014   Pt with Glomerulonephritis at age 5. Deceased donor renal transplant 08-07-1998 right and left renal transplant 10/2014.  Followed by [facility] transplant team; nephrologist is Dr. Lonna Cobb.    Respiratory failure with hypoxia (HCC) 01/06/2013   Shortness of breath 12/12/2012   Swollen testicle 07/13/2011   URI (upper respiratory infection) 01/25/2012    Significant Hospital Events: Including procedures, antibiotic start and stop dates in addition to other pertinent events   8/15 - AVR, MVR, TVR with TCTS (Weldner). Remains on multiple pressors (NE, Epi, vaso). 8/16 - LIJ Trialysis catheter placed for CRRT.  8/17 extubated  8/18 - remains on CVVHD, 3 pressors  8/19-8/20 improving pressor needs 8/21 agitated delirium; cortrak  Interim History / Subjective:  Remains confused. Still on pressors.   Objective:  Blood pressure (!) 91/18, pulse 82, temperature 97.9 F (36.6 C), temperature source Oral, resp. rate (!) 23, height 6'  3" (1.905 m), weight 87.4 kg, SpO2 100%. CVP:  [10 mmHg-21 mmHg] 10 mmHg      Intake/Output Summary (Last 24 hours) at 07/01/2023 1340 Last data filed at 07/01/2023 1300 Gross per 24 hour  Intake 3910.62 ml  Output 6134 ml  Net -2223.38 ml   Filed Weights   06/29/23 0405 06/30/23 0431 07/01/23 0426  Weight: 96.4 kg 95.7 kg 87.4 kg   Physical Examination: General: Lying  comfortably in bed.  HEENT: Small bore feeding tube in place. Chest: clear to auscultation. Sternotomy well apposed.  Right small bore chest tube in place draining sanguinous fluid. CV extremities warm.  2/6 holosystolic murmur. Abdomen soft nontender. GU: Anuric.  Ancillary Tests Personally Reviewed:    Creatinine: 2.39 Na 133 Plt: 77 Echocardiogram 8/22: Robust LV function.  RV function is reduced. Assessment & Plan:   Mitral valve regurgitation, tricuspid regurgitation and aortic stenosis status post valvular repair x 3 Postoperative distributive shock and vasoplegia and  component of RV dysfunction. - ABLA, postoperative as expected.  Active bleeding at present. Thrombocytopenia, ?chronic- stable Postoperative junctional rhythm- now pacer dependent, eventual need for PPM Acute renal failure in the setting of hemodynamic insults on CKD History of kidney disease status post renal transplant Postoperative encephalopathy, ICU delirium- biggest issue at present At risk malnutrition Postoperative mild ileus suspected Postoperative hypoxemic and hypercarbic respiratory failure- suspected related to ongoing fluid shifts History of bloody OG output - might have been TEE related, resolved.  Plan:  -Remains somnolent and delirious although now following some commands.  Start enteral sedation and attempt to wean dexmedetomidine off.  This will also help his blood pressure. -Continue vasopressin, titrate to keep MAP greater than 65 -Continue epinephrine for RV support, target normal SCV O2. -Continue to remove fluid as tolerated via CRRT.  Currently targeting 100-150 net ultrafiltration. -Continue tube feeds currently at goal. -Complete 7-day course of antibiotics. -Transfuse for hemoglobin less than 8.1. -Transfuse platelets if active bleeding.  Best Practice (right click and "Reselect all SmartList Selections" daily)   Diet/type: clear liquid, advance as tolerated; TF adjunct due to  poor PO DVT prophylaxis:heparin ppx GI prophylaxis: PPI Lines: Central line Foley:  Yes, and it is still needed Code Status:  full code Last date of multidisciplinary goals of care discussion [Per Primary Team]  CRITICAL CARE Performed by: Lynnell Catalan   Total critical care time: 35 minutes  Critical care time was exclusive of separately billable procedures and treating other patients.  Critical care was necessary to treat or prevent imminent or life-threatening deterioration.  Critical care was time spent personally by me on the following activities: development of treatment plan with patient and/or surrogate as well as nursing, discussions with consultants, evaluation of patient's response to treatment, examination of patient, obtaining history from patient or surrogate, ordering and performing treatments and interventions, ordering and review of laboratory studies, ordering and review of radiographic studies, pulse oximetry, re-evaluation of patient's condition and participation in multidisciplinary rounds.  Lynnell Catalan, MD [facility] ICU Physician [facility] [place] [facility]  Pager: [phone] Mobile: [phone] After hours: [phone].

## 2023-07-01 NOTE — Progress Notes (Signed)
NAME:  [name], MRN:  [id number], DOB:  Nov 22, 1970, LOS: 9 ADMISSION DATE:  06/22/2023, CONSULTATION DATE: 06/22/2023 REFERRING MD: Leafy Ro - TCTS, CHIEF COMPLAINT: Postcardiac surgery  History of Present Illness:  This is a 52 year old gentleman, past medical history of anxiety depression, bipolar disease, congestive heart failure chronic diastolic heart failure kidney disease followed by renal transplant in 08-02-1998 repeat renal transplant in Aug 02, 2014.  Patient underwent surgery today for severe mitral regurgitation severe tricuspid regurgitation and moderate aortic stenosis.  He also has atrial fibrillation received maze procedure.  He had a complex mitral valve repair a tricuspid valve annuloplasty and aortic valve replacement followed by full right and left maze procedure.  Pertinent Medical History:   Past Medical History:  Diagnosis Date   ADHD (attention deficit hyperactivity disorder)    Anemia    Anemia, chronic disease 07/13/2011   Anxiety    Arthritis    Bipolar 2 disorder (HCC) 08/18/2011   Diagnosed in Aug 02, 2010.    Bipolar disorder (HCC)    Blood dyscrasia    Nephrotic Anemia   Blood transfusion    Blood transfusion without reported diagnosis    Chronic diastolic CHF (congestive heart failure) (HCC)    Complication of anesthesia    Woke up intubated gets combative  afraid to be alone    Congestive heart failure (CHF) (HCC) 07/13/2011   Onset 2005-08-02.  Followed by Dr. Eden Emms.  S/p cardiac catheterization in [facility] Dr. Anne Fu..  S/p cardiac catheterization in 08/02/2013 by Nishan.  Echo 02-Aug-2013.    Depression    Dysrhythmia    A. Fib 2023/08/03 with DCCV in May 2024   Erectile dysfunction    ESRD on hemodialysis (HCC) 07/13/2011   Glomerulonephritis at age 80, started HD in 08-03-1995.  Deceased donor renal transplant 02-Aug-1998 at [facility], then kidney failed and went back on HD in 02-Aug-2005.  L forearm AVF . s/p repeat renal transplant 10/2014 [facility].   GI bleed 11/01/2011   Rectal bleeding with  emesis/diarrhea    Heart murmur    Hemodialysis status (HCC)    Hydrocele, right 11/25/2014   Hypertension    Hypertensive emergency 01/06/2013   Hypoglycemia 01/11/2013   Influenza-like illness 12/18/2013   Mild ascending aorta dilatation (HCC)    NICM (nonischemic cardiomyopathy) (HCC)    Pericardial effusion    a. Mod by echo in 02-Aug-2013, similar to prior.   Peripheral polyneuropathy 08/04/2016   Pneumonia    Pulmonary edema 01/06/2013   Pulmonary hypertension associated with end stage renal disease on dialysis [facility]) 07/13/2011   Renal transplant, status post 11/25/2014   Pt with Glomerulonephritis at age 17. Deceased donor renal transplant 1998/08/02 right and left renal transplant 10/2014.  Followed by [facility] transplant team; nephrologist is Dr. Lonna Cobb.    Respiratory failure with hypoxia (HCC) 01/06/2013   Shortness of breath 12/12/2012   Swollen testicle 07/13/2011   URI (upper respiratory infection) 01/25/2012    Significant Hospital Events: Including procedures, antibiotic start and stop dates in addition to other pertinent events   8/15 - AVR, MVR, TVR with TCTS (Weldner). Remains on multiple pressors (NE, Epi, vaso). 8/16 - LIJ Trialysis catheter placed for CRRT.  8/17 extubated  8/18 - remains on CVVHD, 3 pressors  8/19-8/20 improving pressor needs 8/21 agitated delirium; cortrak  Interim History / Subjective:  Not confused, calm, complains of neck pain and being thirsty.   Objective:  Blood pressure (!) 91/18, pulse 82, temperature 97.9 F (36.6 C), temperature source Oral, resp.  rate (!) 28, height 6\' 3"  (1.905 m), weight 87.4 kg, SpO2 97%. CVP:  [8 mmHg-21 mmHg] 8 mmHg      Intake/Output Summary (Last 24 hours) at 07/01/2023 1416 Last data filed at 07/01/2023 1400 Gross per 24 hour  Intake 3909.82 ml  Output 5923.9 ml  Net -2014.08 ml   Filed Weights   06/29/23 0405 06/30/23 0431 07/01/23 0426  Weight: 96.4 kg 95.7 kg 87.4 kg   Physical  Examination: General: Lying comfortably in bed.  HEENT: Small bore feeding tube in place. Chest: clear to auscultation. Sternotomy well apposed.  Right small bore chest tube in place draining sanguinous fluid. CV extremities warm.  Right sided gallop. No edema. CVP 11. Fistula in both arm. AV pacing via epicardial leads. Abdomen soft nontender. GU: Anuric.  Ancillary Tests Personally Reviewed:    Creatinine: 2.20 Na 134 Plt: 74 Echocardiogram 8/22: Robust LV function.  RV function is reduced. Assessment & Plan:   Mitral valve regurgitation, tricuspid regurgitation and aortic stenosis status post valvular repair x 3 Postoperative distributive shock and vasoplegia and  component of RV dysfunction. - ABLA, postoperative as expected.  Active bleeding at present. Thrombocytopenia, ?chronic- stable Postoperative junctional rhythm- now pacer dependent, eventual need for PPM Acute renal failure in the setting of hemodynamic insults on CKD History of kidney disease status post renal transplant Postoperative encephalopathy, ICU delirium- now seems to have cleared At risk malnutrition Postoperative mild ileus suspected Postoperative hypoxemic and hypercarbic respiratory failure- suspected related to ongoing fluid shifts History of bloody OG output - might have been TEE related, resolved. MSK position related cervical pain.   Plan:  -Remains somnolent but now following commands and conversing intelligently. Will stop dexmedetomidine.  -Continue vasopressin, titrate to keep MAP greater than 65 -Continue epinephrine for RV support, target normal SCV O2. -Continue to remove fluid via CRRT. Given that he is no longer edematous, have decreased UF rate to see if we can get patient off pressors.  -Continue tube feeds currently at goal. -Complete 7-day course of antibiotics. -Transfuse for hemoglobin less than 8.1. -Transfuse platelets if active bleeding. - Tramadol for pain  Best Practice  (right click and "Reselect all SmartList Selections" daily)   Diet/type: clear liquid, advance as tolerated; TF adjunct due to poor PO. Swallow evaluation now that he's more awake.  DVT prophylaxis:heparin ppx GI prophylaxis: PPI Lines: Central line Foley:  No  Code Status:  full code Last date of multidisciplinary goals of care discussion [Per Primary Team]  CRITICAL CARE Performed by: Lynnell Catalan   Total critical care time: 35 minutes  Critical care time was exclusive of separately billable procedures and treating other patients.  Critical care was necessary to treat or prevent imminent or life-threatening deterioration.  Critical care was time spent personally by me on the following activities: development of treatment plan with patient and/or surrogate as well as nursing, discussions with consultants, evaluation of patient's response to treatment, examination of patient, obtaining history from patient or surrogate, ordering and performing treatments and interventions, ordering and review of laboratory studies, ordering and review of radiographic studies, pulse oximetry, re-evaluation of patient's condition and participation in multidisciplinary rounds.  Lynnell Catalan, MD [facility] ICU Physician [facility] [place] Critical Care  Pager: [phone] Mobile: [phone] After hours: [phone].

## 2023-07-01 NOTE — Progress Notes (Signed)
[facility] Progress Note  Subjective: seen in ICU, I/O net neg 1.9 L yesterday. Wt's 87kg today.   Vitals:   07/01/23 1115 07/01/23 1130 07/01/23 1145 07/01/23 1200  BP:      Pulse: 82 82 82 81  Resp: (!) 23 (!) 24 (!) 25 (!) 30  Temp:      TempSrc:      SpO2: 99% 99% 99% 98%  Weight:      Height:        Exam: Gen lethargic , no distress No jvd or bruits Chest clear anterior/ lateral RRR no RG Abd soft ntnd no mass or ascites +bs Ext no LE edema, facial/ UE edema resolved      RUE AVF +bruit     OP HD: [place] triad 3h    F250     94kg   2K/2.5 Ca bath   RUE AVF   Summary: [name] is a/an 52 y.o. male with a past medical history notable for ESRD on HD admitted with MV, AV, and TV issues s/p repair/replacement.   Assessment/ Plan # Tricuspid, aortic, mitral valve insufficiencies - Status post Tri valve repair on 8/15.  Per cardiothoracic surgery  # Complete heart block - getting paced via epicardial wires. Per EP/ cards.   # ESRD - pt was too hypotensive for hemodialysis post-op. Started CRRT 8/16. Cont CRRT.     # Volume/ shock - CVP is coming down now w/ CVP 10-16 today so far. CXR today no change in vasc congestion. Cont UF 150- 200 cc/hr net negative w/ CRRT.    # Anemia esrd - Hb 7.5- 8.5, follow, transfuse as needed.     # MBD ckd - CCa in range, phos is low. Not taking any binders. Give IV phos as needed. Continue to monitor.   # Vascular access - RUE AVF functioning well but unable to use due to shock.  Temporary dialysis catheter placed for CRRT.   [name] MD  [facility] 07/01/2023, 12:21 PM  Recent Labs  Lab 06/30/23 1630 07/01/23 0335  HGB 8.1* 8.0*  ALBUMIN 2.6* 2.6*  CALCIUM 8.9 9.3  PHOS 2.2* 2.1*  CREATININE 2.20* 2.20*  K 4.1 4.1   Recent Labs  Lab 06/26/23 0754  IRON 121  TIBC 133*  FERRITIN 1,444*   Inpatient medications:  acyclovir  200 mg Per Tube BID   atorvastatin  10 mg Per Tube QHS   bisacodyl  10 mg Oral  Daily   Or   bisacodyl  10 mg Rectal Daily   Chlorhexidine Gluconate Cloth  6 each Topical Daily   docusate  100 mg Per Tube BID   feeding supplement (PROSource TF20)  60 mL Per Tube BID   guaiFENesin  15 mL Oral BID   heparin injection (subcutaneous)  5,000 Units Subcutaneous Q8H   insulin aspart  0-24 Units Subcutaneous Q4H   lidocaine-prilocaine  1 Application Topical Q M,W,F   midodrine  10 mg Per Tube TID   multivitamin  1 tablet Per Tube QHS   mouth rinse  15 mL Mouth Rinse 4 times per day   pantoprazole (PROTONIX) IV  40 mg Intravenous Q12H   polyethylene glycol  17 g Per Tube BID   QUEtiapine  25 mg Per Tube BID   sodium chloride flush  3 mL Intravenous Q12H     prismasol BGK 4/2.5 400 mL/hr at 07/01/23 0009    prismasol BGK 4/2.5 400 mL/hr at 07/01/23 0009  sodium chloride Stopped (06/30/23 1935)   albumin human Stopped (06/23/23 0040)   dexmedetomidine (PRECEDEX) IV infusion 0.2 mcg/kg/hr (07/01/23 1200)   epinephrine 12 mcg/min (07/01/23 1200)   feeding supplement (PIVOT 1.5 CAL) 60 mL/hr at 07/01/23 1200   lactated ringers 10 mL/hr at 07/01/23 1200   piperacillin-tazobactam 100 mL/hr at 07/01/23 1200   prismasol BGK 4/2.5 1,500 mL/hr at 07/01/23 1148   vancomycin Stopped (06/30/23 1926)   vasopressin 0.04 Units/min (07/01/23 1200)   sodium chloride, albumin human, heparin, ondansetron (ZOFRAN) IV, mouth rinse, oxyCODONE, phenol, sodium chloride, sodium chloride flush

## 2023-07-01 NOTE — Progress Notes (Signed)
9 Days Post-Op Procedure(s) (LRB): TRICUSPID VALVE REPAIR MC3 Ring size 30 (N/A) MITRAL VALVE REPAIR (MVR) utilizing Simulis Band size 30mm (N/A) AORTIC VALVE REPLACEMENT (AVR) inspiris valve size 27 (N/A) MAZE (N/A) TRANSESOPHAGEAL ECHOCARDIOGRAM (N/A) Subjective: Lethargic but more easily aroused, answers questions and follows commands  Objective: Vital signs in last 24 hours: Temp:  [97.5 F (36.4 C)-98.3 F (36.8 C)] 97.9 F (36.6 C) (08/24 0743) Pulse Rate:  [81-83] 82 (08/24 0700) Cardiac Rhythm: A-V Sequential paced (08/23 2000) Resp:  [21-34] 27 (08/24 0700) BP: (91-109)/(18-24) 91/18 (08/23 1000) SpO2:  [97 %-100 %] 100 % (08/24 0700) Arterial Line BP: (98-274)/(36-108) 107/45 (08/24 0700) Weight:  [87.4 kg] 87.4 kg (08/24 0426)  Hemodynamic parameters for last 24 hours: CVP:  [10 mmHg-21 mmHg] 16 mmHg  Intake/Output from previous day: 08/23 0701 - 08/24 0700 In: 3982.1 [I.V.:1612.1; NG/GT:1920; IV Piggyback:450] Out: 1308.6 [Chest Tube:100] Intake/Output this shift: No intake/output data recorded.  General appearance: cooperative and slowed mentation Neurologic: no focal deficit Heart: regular rate and rhythm Lungs: clear anteriorly Abdomen: normal findings: soft, non-tender  Lab Results: Recent Labs    06/30/23 1630 07/01/23 0335  WBC 5.7 5.3  HGB 8.1* 8.0*  HCT 26.6* 26.9*  PLT 73* 74*   BMET:  Recent Labs    06/30/23 1630 07/01/23 0335  NA 135 134*  K 4.1 4.1  CL 105 102  CO2 26 22  GLUCOSE 151* 161*  BUN 23* 24*  CREATININE 2.20* 2.20*  CALCIUM 8.9 9.3    PT/INR: No results for input(s): "LABPROT", "INR" in the last 72 hours. ABG    Component Value Date/Time   PHART 7.305 (L) 06/28/2023 0756   HCO3 26.3 06/28/2023 0756   TCO2 28 06/28/2023 0756   ACIDBASEDEF 1.0 06/28/2023 0756   O2SAT 88.5 06/30/2023 0946   CBG (last 3)  Recent Labs    06/30/23 1954 06/30/23 2331 07/01/23 0740  GLUCAP 147* 141* 136*     Assessment/Plan: S/P Procedure(s) (LRB): TRICUSPID VALVE REPAIR MC3 Ring size 30 (N/A) MITRAL VALVE REPAIR (MVR) utilizing Simulis Band size 30mm (N/A) AORTIC VALVE REPLACEMENT (AVR) inspiris valve size 27 (N/A) MAZE (N/A) TRANSESOPHAGEAL ECHOCARDIOGRAM (N/A) - NEURO- TME/ delirium- slowly improving CV- pacer dependent  Vasopressin 0.04, epi down to 10  On midodrine RESP- CXR unchanged  On 5L HFNC, BIPAP PRN  Pigtail ~ 100 ml- place to water seal RENAL- on CRRT, 2L negative yesterday  Lytes Ok, BUN, creatinine stable ENDO- CBG mildly elevated  Continue SSI GI/ Nutrition- moderate protein calorie malnutrition  Continue TF ID- afebrile, normal WBC   On Vanco and Zosyn Anemia stable, monitor Thrombocytopenia- stable, monitor    LOS: 9 days    Loreli Slot 07/01/2023

## 2023-07-02 ENCOUNTER — Inpatient Hospital Stay (HOSPITAL_COMMUNITY): Payer: Medicare HMO

## 2023-07-02 DIAGNOSIS — Z9889 Other specified postprocedural states: Secondary | ICD-10-CM | POA: Diagnosis not present

## 2023-07-02 DIAGNOSIS — I442 Atrioventricular block, complete: Secondary | ICD-10-CM | POA: Diagnosis not present

## 2023-07-02 DIAGNOSIS — R4182 Altered mental status, unspecified: Secondary | ICD-10-CM | POA: Diagnosis not present

## 2023-07-02 LAB — RENAL FUNCTION PANEL
Albumin: 2.5 g/dL — ABNORMAL LOW (ref 3.5–5.0)
Anion gap: 9 (ref 5–15)
BUN: 27 mg/dL — ABNORMAL HIGH (ref 6–20)
CO2: 24 mmol/L (ref 22–32)
Calcium: 9.9 mg/dL (ref 8.9–10.3)
Chloride: 100 mmol/L (ref 98–111)
Creatinine, Ser: 2.25 mg/dL — ABNORMAL HIGH (ref 0.61–1.24)
GFR, Estimated: 35 mL/min — ABNORMAL LOW (ref >60)
Glucose, Bld: 151 mg/dL — ABNORMAL HIGH (ref 70–99)
Phosphorus: 2.3 mg/dL — ABNORMAL LOW (ref 2.5–4.6)
Potassium: 4.2 mmol/L (ref 3.5–5.1)
Sodium: 133 mmol/L — ABNORMAL LOW (ref 135–145)

## 2023-07-02 LAB — CBC
HCT: 26.5 % — ABNORMAL LOW (ref 39.0–52.0)
Hemoglobin: 8.1 g/dL — ABNORMAL LOW (ref 13.0–17.0)
MCH: 32.9 pg (ref 26.0–34.0)
MCHC: 30.6 g/dL (ref 30.0–36.0)
MCV: 107.7 fL — ABNORMAL HIGH (ref 80.0–100.0)
Platelets: 85 10*3/uL — ABNORMAL LOW (ref 150–400)
RBC: 2.46 MIL/uL — ABNORMAL LOW (ref 4.22–5.81)
RDW: 18.5 % — ABNORMAL HIGH (ref 11.5–15.5)
WBC: 6 10*3/uL (ref 4.0–10.5)
nRBC: 1 % — ABNORMAL HIGH (ref 0.0–0.2)

## 2023-07-02 LAB — VANCOMYCIN, RANDOM: Vancomycin Rm: 26 ug/mL

## 2023-07-02 LAB — COOXEMETRY PANEL
Carboxyhemoglobin: 2.8 % — ABNORMAL HIGH (ref 0.5–1.5)
Methemoglobin: <0.7 % (ref 0.0–1.5)
O2 Saturation: 79.1 %
Total hemoglobin: 8.5 g/dL — ABNORMAL LOW (ref 12.0–16.0)

## 2023-07-02 LAB — MAGNESIUM: Magnesium: 2.5 mg/dL — ABNORMAL HIGH (ref 1.7–2.4)

## 2023-07-02 LAB — GLUCOSE, CAPILLARY
Glucose-Capillary: 148 mg/dL — ABNORMAL HIGH (ref 70–99)
Glucose-Capillary: 157 mg/dL — ABNORMAL HIGH (ref 70–99)
Glucose-Capillary: 137 mg/dL — ABNORMAL HIGH (ref 70–99)
Glucose-Capillary: 136 mg/dL — ABNORMAL HIGH (ref 70–99)
Glucose-Capillary: 121 mg/dL — ABNORMAL HIGH (ref 70–99)
Glucose-Capillary: 134 mg/dL — ABNORMAL HIGH (ref 70–99)
Glucose-Capillary: 125 mg/dL — ABNORMAL HIGH (ref 70–99)

## 2023-07-02 LAB — HEPATIC FUNCTION PANEL
ALT: 10 U/L (ref 0–44)
AST: 51 U/L — ABNORMAL HIGH (ref 15–41)
Albumin: 2.6 g/dL — ABNORMAL LOW (ref 3.5–5.0)
Alkaline Phosphatase: 96 U/L (ref 38–126)
Bilirubin, Direct: 0.4 mg/dL — ABNORMAL HIGH (ref 0.0–0.2)
Indirect Bilirubin: 0.8 mg/dL (ref 0.3–0.9)
Total Bilirubin: 1.2 mg/dL (ref 0.3–1.2)
Total Protein: 7.9 g/dL (ref 6.5–8.1)

## 2023-07-02 MED ORDER — ESCITALOPRAM OXALATE 10 MG PO TABS
10.0000 mg | ORAL_TABLET | Freq: Every day | ORAL | Status: DC
  Administered 2023-07-02 – 2023-07-03 (×2): 10 mg via ORAL
  Filled 2023-07-02 (×2): qty 1

## 2023-07-02 MED ORDER — COLCHICINE 0.3 MG HALF TABLET
0.3000 mg | ORAL_TABLET | Freq: Every day | ORAL | Status: DC
  Administered 2023-07-02: 0.3 mg via ORAL
  Filled 2023-07-02 (×2): qty 1

## 2023-07-02 MED ORDER — CLONAZEPAM 0.5 MG PO TABS
0.5000 mg | ORAL_TABLET | Freq: Two times a day (BID) | ORAL | Status: DC | PRN
  Administered 2023-07-03: 0.5 mg via ORAL
  Filled 2023-07-02 (×2): qty 1

## 2023-07-02 MED ORDER — PIPERACILLIN-TAZOBACTAM IN DEX 2-0.25 GM/50ML IV SOLN
2.2500 g | Freq: Three times a day (TID) | INTRAVENOUS | Status: DC
  Administered 2023-07-02 – 2023-07-03 (×2): 2.25 g via INTRAVENOUS
  Filled 2023-07-02 (×3): qty 50

## 2023-07-02 MED ORDER — OXYCODONE HCL 5 MG/5ML PO SOLN
5.0000 mg | ORAL | Status: DC | PRN
  Administered 2023-07-02 – 2023-07-10 (×14): 5 mg
  Filled 2023-07-02 (×17): qty 5

## 2023-07-02 MED ORDER — VANCOMYCIN VARIABLE DOSE PER UNSTABLE RENAL FUNCTION (PHARMACIST DOSING): Status: DC

## 2023-07-02 NOTE — Evaluation (Signed)
Clinical/Bedside Swallow Evaluation Patient Details  Name: [name] MRN: [id number] Date of Birth: 1971-07-25  Today's Date: 07/02/2023 Time: SLP Start Time (ACUTE ONLY): 0820 SLP Stop Time (ACUTE ONLY): 0830 SLP Time Calculation (min) (ACUTE ONLY): 10 min  Past Medical History:  Past Medical History:  Diagnosis Date   ADHD (attention deficit hyperactivity disorder)    Anemia    Anemia, chronic disease 07/13/2011   Anxiety    Arthritis    Bipolar 2 disorder (HCC) 08/18/2011   Diagnosed in 20-Jul-2010.    Bipolar disorder (HCC)    Blood dyscrasia    Nephrotic Anemia   Blood transfusion    Blood transfusion without reported diagnosis    Chronic diastolic CHF (congestive heart failure) (HCC)    Complication of anesthesia    Woke up intubated gets combative  afraid to be alone    Congestive heart failure (CHF) (HCC) 07/13/2011   Onset 07-20-05.  Followed by Dr. Eden Emms.  S/p cardiac catheterization in [facility] Dr. Anne Fu..  S/p cardiac catheterization in 2013/07/20 by Nishan.  Echo 20-Jul-2013.    Depression    Dysrhythmia    A. Fib 07/21/2023 with DCCV in May 2024   Erectile dysfunction    ESRD on hemodialysis (HCC) 07/13/2011   Glomerulonephritis at age 12, started HD in 1995/07/21.  Deceased donor renal transplant 07-20-98 at [facility], then kidney failed and went back on HD in 2005/07/20.  L forearm AVF . s/p repeat renal transplant 10/2014 [facility].   GI bleed 11/01/2011   Rectal bleeding with emesis/diarrhea    Heart murmur    Hemodialysis status (HCC)    Hydrocele, right 11/25/2014   Hypertension    Hypertensive emergency 01/06/2013   Hypoglycemia 01/11/2013   Influenza-like illness 12/18/2013   Mild ascending aorta dilatation (HCC)    NICM (nonischemic cardiomyopathy) (HCC)    Pericardial effusion    a. Mod by echo in 07/20/13, similar to prior.   Peripheral polyneuropathy 08/04/2016   Pneumonia    Pulmonary edema 01/06/2013   Pulmonary hypertension associated with end stage renal disease on dialysis [facility])  07/13/2011   Renal transplant, status post 11/25/2014   Pt with Glomerulonephritis at age 23. Deceased donor renal transplant 07-20-1998 right and left renal transplant 10/2014.  Followed by [facility] transplant team; nephrologist is Dr. Lonna Cobb.    Respiratory failure with hypoxia (HCC) 01/06/2013   Shortness of breath 12/12/2012   Swollen testicle 07/13/2011   URI (upper respiratory infection) 01/25/2012   Past Surgical History:  Past Surgical History:  Procedure Laterality Date   ANGIOPLASTY     AORTIC VALVE REPLACEMENT N/A 06/22/2023   Procedure: AORTIC VALVE REPLACEMENT (AVR) inspiris valve size 27;  Surgeon: Eugenio Hoes, MD;  Location: [facility] OR;  Service: Open Heart Surgery;  Laterality: N/A;   BUBBLE STUDY  09/05/2022   Procedure: BUBBLE STUDY;  Surgeon: Sande Rives, MD;  Location: [facility] ENDOSCOPY;  Service: Cardiovascular;;   CARDIOVERSION N/A 04/13/2023   Procedure: CARDIOVERSION;  Surgeon: Jake Bathe, MD;  Location: [facility] INVASIVE CV LAB;  Service: Cardiovascular;  Laterality: N/A;   CHOLECYSTECTOMY     DIALYSIS FISTULA CREATION     EVALUATION UNDER ANESTHESIA WITH HEMORRHOIDECTOMY N/A 03/15/2020   Procedure: CONTROL OF ANAL BLEEDING;  Surgeon: Andria Meuse, MD;  Location: [facility] OR;  Service: General;  Laterality: N/A;   KIDNEY TRANSPLANT  10/17/2014   KNEE SURGERY Right    arthroscopic   LEFT HEART CATHETERIZATION WITH CORONARY ANGIOGRAM N/A 10/01/2013   Procedure: LEFT  HEART CATHETERIZATION WITH CORONARY ANGIOGRAM;  Surgeon: Wendall Stade, MD;  Location: [facility] CATH LAB;  Service: Cardiovascular;  Laterality: N/A;   MAZE N/A 06/22/2023   Procedure: MAZE;  Surgeon: Eugenio Hoes, MD;  Location: [facility] OR;  Service: Open Heart Surgery;  Laterality: N/A;   MITRAL VALVE REPAIR N/A 06/22/2023   Procedure: MITRAL VALVE REPAIR (MVR) utilizing Simulis Band size 30mm;  Surgeon: Eugenio Hoes, MD;  Location: [facility] OR;  Service: Open Heart Surgery;  Laterality: N/A;   MOUTH SURGERY      teeth removed   NEPHRECTOMY TRANSPLANTED ORGAN     RIGHT/LEFT HEART CATH AND CORONARY ANGIOGRAPHY N/A 11/24/2022   Procedure: RIGHT/LEFT HEART CATH AND CORONARY ANGIOGRAPHY;  Surgeon: Kathleene Hazel, MD;  Location: [facility] INVASIVE CV LAB;  Service: Cardiovascular;  Laterality: N/A;   TEE WITHOUT CARDIOVERSION N/A 09/05/2022   Procedure: TRANSESOPHAGEAL ECHOCARDIOGRAM (TEE);  Surgeon: Sande Rives, MD;  Location: [facility] ENDOSCOPY;  Service: Cardiovascular;  Laterality: N/A;   TEE WITHOUT CARDIOVERSION N/A 06/22/2023   Procedure: TRANSESOPHAGEAL ECHOCARDIOGRAM;  Surgeon: Eugenio Hoes, MD;  Location: [facility] OR;  Service: Open Heart Surgery;  Laterality: N/A;   TRICUSPID VALVE REPLACEMENT N/A 06/22/2023   Procedure: TRICUSPID VALVE REPAIR MC3 Ring size 30;  Surgeon: Eugenio Hoes, MD;  Location: [facility] OR;  Service: Open Heart Surgery;  Laterality: N/A;   HPI:  This is a 52 year old gentleman, Patient underwent surgery 8/15  for severe mitral regurgitation severe tricuspid regurgitation and moderate aortic stenosis.  He also has atrial fibrillation received maze procedure.  He had a complex mitral valve repair a tricuspid valve annuloplasty and aortic valve replacement followed by full right and left maze procedure. Extubated 8/17. past medical history of tracheostomy in 2011 (reason unknown, pt reports "real bad pna"), anxiety depression, bipolar disease, congestive heart failure chronic diastolic heart failure kidney disease followed by renal transplant in 1999 repeat renal transplant in 2015.  He had a FEES with a diet recommendation during this admission but then he became lethargic and was made NPO again.     Assessment / Plan / Recommendation  Clinical Impression  Repeat clinical swallowing evaluation was completed using thin liquids via spoon and pureed material.  RN approved patient for PO intake and reported he had been more alert.  Cranial nerve exam was completed and unremarkable.   Lingual, labial, facial and jaw range of motion and strength appeared to be adequate.  Facial sensation appeared to be intact and he did not endorse a difference in sensation between the right and left side of his face.  He presented with a possible oral and pharyngeal dysphagia.  A/P transport appeared to be slow.  Immediate cough response was seen given thin liquids via spoon.  Cough sounded weak.  Multiple swallows were seen given pureed material.   Given clinical presentation suggest he remain NPO except ice chips for swallow practice and oral dryness pending re evaluation of his swallowing. SLP Visit Diagnosis: Dysphagia, unspecified (R13.10)    Aspiration Risk  Moderate aspiration risk    Diet Recommendation   NPO  Medication Administration: Via alternative means    Other  Recommendations Oral Care Recommendations: Oral care BID    Recommendations for follow up therapy are one component of a multi-disciplinary discharge planning process, led by the attending physician.  Recommendations may be updated based on patient status, additional functional criteria and insurance authorization.  Follow up Recommendations Skilled nursing-short term rehab (<3 hours/day)  Functional Status Assessment Patient has had a recent decline in their functional status and demonstrates the ability to make significant improvements in function in a reasonable and predictable amount of time.  Frequency and Duration min 2x/week  2 weeks       Prognosis Prognosis for improved oropharyngeal function: Good      Swallow Study   General Date of Onset: 06/22/23 HPI: This is a 52 year old gentleman, Patient underwent surgery 8/15  for severe mitral regurgitation severe tricuspid regurgitation and moderate aortic stenosis.  He also has atrial fibrillation received maze procedure.  He had a complex mitral valve repair a tricuspid valve annuloplasty and aortic valve replacement followed by full right and left  maze procedure. Extubated 8/17. past medical history of tracheostomy in 2011 (reason unknown, pt reports "real bad pna"), anxiety depression, bipolar disease, congestive heart failure chronic diastolic heart failure kidney disease followed by renal transplant in 1999 repeat renal transplant in 2015. Type of Study: Bedside Swallow Evaluation Previous Swallow Assessment: FEES during this admission Diet Prior to this Study: NPO Temperature Spikes Noted: No Respiratory Status: Nasal cannula History of Recent Intubation: Yes Total duration of intubation (days): 3 days Date extubated: 06/24/23 Behavior/Cognition: Lethargic/Drowsy Oral Cavity Assessment: Within Functional Limits Oral Care Completed by SLP: No Oral Cavity - Dentition: Adequate natural dentition Vision: Impaired for self-feeding Self-Feeding Abilities: Total assist Patient Positioning: Upright in bed Baseline Vocal Quality: Aphonic Volitional Cough: Weak Volitional Swallow: Able to elicit    Oral/Motor/Sensory Function Overall Oral Motor/Sensory Function: Within functional limits   Ice Chips Ice chips: Not tested   Thin Liquid Thin Liquid: Impaired Presentation: Spoon Pharyngeal  Phase Impairments: Cough - Immediate    Nectar Thick Nectar Thick Liquid: Not tested   Honey Thick Honey Thick Liquid: Not tested   Puree Puree: Impaired Presentation: Spoon Pharyngeal Phase Impairments: Multiple swallows   Solid     Solid: Not tested     Dimas Aguas, MA, CCC-SLP Acute Rehab SLP [phone]  Fleet Contras 07/02/2023,10:36 AM

## 2023-07-02 NOTE — H&P (View-Only) (Signed)
  Patient Name: [name] Date of Encounter: 07/03/2023   Primary Cardiologist: Reatha Harps, MD Electrophysiologist: Lanier Prude, MD  Interval Summary   Lethargic today. Stirs to questioning then goes back to sleep.   Pressors Remains on epi, now at 13, back up over weekend.   Vital Signs    Vitals:   07/03/23 0030 07/03/23 0045 07/03/23 0330 07/03/23 0500  BP:      Pulse: 82 81    Resp: 19 13    Temp:   98.4 F (36.9 C)   TempSrc:   Oral   SpO2: 97% 96%    Weight:    86.5 kg  Height:        Intake/Output Summary (Last 24 hours) at 07/03/2023 0740 Last data filed at 07/03/2023 0100 Gross per 24 hour  Intake 1993.39 ml  Output 20 ml  Net 1973.39 ml   Filed Weights   07/01/23 0426 07/02/23 0500 07/03/23 0500  Weight: 87.4 kg 87.7 kg 86.5 kg    Physical Exam    GEN- The patient is well appearing, alert and oriented x 3 today.   Lungs- Clear to ausculation bilaterally, normal work of breathing Cardiac- Regular rate and rhythm, no murmurs, rubs or gallops GI- soft, NT, ND, + BS Extremities- no clubbing or cyanosis. No edema  Telemetry    AV pacing in 80s (personally reviewed)  Hospital Course    52 y.o. male w/hx of Anxiety, depression, Bipolar disease, CHF, CKD s/p renal transplant 1999 -> redo 2015 -> ESRD/HD, and severe MR/severe TR/Moderate AS    Tricuspid Valve Repair, Mitral Valve Repair, Aortic Valve Replacement and MAZE 8/15    >> CHB pacing via epicardial wires  Assessment & Plan    CHB S/p AVR/TVr/MVr/MAZE/LAAC Epicardial wires with stable threshold over weekend.  If worsens, will need TVP as bridge to perm pacer.  ESRD Per primary team CVVHD -> Now plan for iHD.  Pt remains too unstable for PPM as he will need a leadless. He would otherwise be at a very high risk of infection. Will need to be off pressors and otherwise improved prior to consideration sedation for leadless.   For questions or updates, please contact [facility] Please consult [email or web address] for contact info under Cardiology/STEMI.  Signed, Graciella Freer, PA-C  07/03/2023, 7:40 AM

## 2023-07-02 NOTE — Progress Notes (Signed)
Per MD order, pigtail chest tube catheter removed by this RN today at 1815 without complication.

## 2023-07-02 NOTE — Progress Notes (Signed)
10 Days Post-Op Procedure(s) (LRB): TRICUSPID VALVE REPAIR MC3 Ring size 30 (N/A) MITRAL VALVE REPAIR (MVR) utilizing Simulis Band size 30mm (N/A) AORTIC VALVE REPLACEMENT (AVR) inspiris valve size 27 (N/A) MAZE (N/A) TRANSESOPHAGEAL ECHOCARDIOGRAM (N/A) Subjective: C/o pain right knee and right big toe  Objective: Vital signs in last 24 hours: Temp:  [97.6 F (36.4 C)-98.4 F (36.9 C)] 97.6 F (36.4 C) (08/25 0756) Pulse Rate:  [79-84] 80 (08/25 0730) Cardiac Rhythm: A-V Sequential paced (08/24 2000) Resp:  [16-32] 29 (08/25 0730) SpO2:  [92 %-100 %] 95 % (08/25 0730) Arterial Line BP: (95-246)/(34-83) 106/40 (08/25 0730) Weight:  [87.7 kg] 87.7 kg (08/25 0500)  Hemodynamic parameters for last 24 hours: CVP:  [8 mmHg-28 mmHg] 15 mmHg  Intake/Output from previous day: 08/24 0701 - 08/25 0700 In: 3732.6 [I.V.:1112.7; NG/GT:2170; IV Piggyback:449.9] Out: 5466 [Stool:1; Chest Tube:60] Intake/Output this shift: No intake/output data recorded.  General appearance: cooperative Neurologic: psychomotor slowing but nonfocal  Heart: regular rate and rhythm Lungs: diminished breath sounds bibasilar Abdomen: normal findings: soft, non-tender Tender to palpation right great toe but no significant swelling, redness. No tenderness to palpation right knee  Lab Results: Recent Labs    07/01/23 0335 07/02/23 0255  WBC 5.3 6.0  HGB 8.0* 8.1*  HCT 26.9* 26.5*  PLT 74* 85*   BMET:  Recent Labs    07/01/23 1629 07/02/23 0255  NA 134* 133*  K 4.2 4.2  CL 100 100  CO2 25 24  GLUCOSE 140* 151*  BUN 26* 27*  CREATININE 2.22* 2.25*  CALCIUM 9.7 9.9    PT/INR: No results for input(s): "LABPROT", "INR" in the last 72 hours. ABG    Component Value Date/Time   PHART 7.305 (L) 06/28/2023 0756   HCO3 26.3 06/28/2023 0756   TCO2 28 06/28/2023 0756   ACIDBASEDEF 1.0 06/28/2023 0756   O2SAT 79.1 07/02/2023 0309   CBG (last 3)  Recent Labs    07/01/23 2343 07/02/23 0353  07/02/23 0755  GLUCAP 122* 148* 157*    Assessment/Plan: S/P Procedure(s) (LRB): TRICUSPID VALVE REPAIR MC3 Ring size 30 (N/A) MITRAL VALVE REPAIR (MVR) utilizing Simulis Band size 30mm (N/A) AORTIC VALVE REPLACEMENT (AVR) inspiris valve size 27 (N/A) MAZE (N/A) TRANSESOPHAGEAL ECHOCARDIOGRAM (N/A) NEURO- continues to improve  C/o anxiety, was on clonazepam preop- will resume BID CV- Paced at 80, for PPM later in week  Co-ox 79 still on vasopressin 0.04 and epi down to 7  On midodrine 10 mg Q8  CVP 15 RESP- IS for atelectasis RENAL- ESRD on CRRT  1700 negative yesterday GI- tolerating TF  Swallow eval now that he is more alert ENDO- CBG mildly elevated SCD + SQ heparin for DVT prophylaxis Pain- on oxycodone + tramadol,, pain in big toe worrisome for gout- will start low dose colchicine, increase oxycodone to 5 mg   LOS: 10 days    Loreli Slot 07/02/2023

## 2023-07-02 NOTE — Progress Notes (Signed)
[facility] Progress Note  Subjective: seen in ICU, I/O net neg 2 L yesterday. CVP 13- 15 range, stable. Off of [place] O2. UF lowered.   Vitals:   07/02/23 0515 07/02/23 0530 07/02/23 0545 07/02/23 0600  BP:      Pulse: 80 80 80 80  Resp: (!) 28 (!) 27 (!) 28 (!) 28  Temp:      TempSrc:      SpO2: 93% 94% 94% 95%  Weight:      Height:        Exam: Gen lethargic , no distress, on RA No jvd or bruits Chest clear anterior/ lateral RRR no RG Abd soft ntnd no mass or ascites +bs Ext no LE edema, facial/ UE edema resolved      RUE AVF +bruit     OP HD: [place] triad 3h    F250     94kg   2K/2.5 Ca bath   RUE AVF   Summary: [name] is a/an 52 y.o. male with a past medical history notable for ESRD on HD admitted with MV, AV, and TV issues s/p repair/replacement.   Assessment/ Plan # Tricuspid, aortic, mitral valve insufficiencies - Status post Tri valve repair on 8/15.  Per cardiothoracic surgery  # Complete heart block - post-op, per EP/ cards.   # ESRD - pt was too hypotensive for hemodialysis post-op. Started CRRT 8/16. Stopping CRRT today as volume issues are mostly under control.    # Volume/ shock - CVP stable at 12- 15 today. Pt is on room air and edema resolved, wt's down at 87kg.    # Anemia esrd - Hb 7.5- 8.5, follow, transfuse as needed.     # MBD ckd - CCa in range, phos is low. Not taking any binders. Give IV phos as needed. Continue to monitor.   # Vascular access - RUE AVF functioning well but unable to use due to shock.  Temporary dialysis catheter placed for CRRT.   [name] MD  [facility] 07/02/2023, 6:47 AM  Recent Labs  Lab 07/01/23 0335 07/01/23 1629 07/02/23 0255  HGB 8.0*  --  8.1*  ALBUMIN 2.6* 2.6* 2.6*  2.5*  CALCIUM 9.3 9.7 9.9  PHOS 2.1* 2.4* 2.3*  CREATININE 2.20* 2.22* 2.25*  K 4.1 4.2 4.2   Recent Labs  Lab 06/26/23 0754  IRON 121  TIBC 133*  FERRITIN 1,444*   Inpatient medications:  acyclovir  200 mg Per Tube  BID   atorvastatin  10 mg Per Tube QHS   bisacodyl  10 mg Oral Daily   Or   bisacodyl  10 mg Rectal Daily   Chlorhexidine Gluconate Cloth  6 each Topical Daily   docusate  100 mg Per Tube BID   feeding supplement (PROSource TF20)  60 mL Per Tube BID   guaiFENesin  15 mL Oral BID   heparin injection (subcutaneous)  5,000 Units Subcutaneous Q8H   insulin aspart  0-24 Units Subcutaneous Q4H   lidocaine-prilocaine  1 Application Topical Q M,W,F   midodrine  10 mg Per Tube TID   multivitamin  1 tablet Per Tube QHS   mouth rinse  15 mL Mouth Rinse 4 times per day   pantoprazole (PROTONIX) IV  40 mg Intravenous Q12H   polyethylene glycol  17 g Per Tube BID   QUEtiapine  25 mg Per Tube BID   sodium chloride flush  3 mL Intravenous Q12H     prismasol BGK 4/2.5 400 mL/hr  at 07/02/23 0144    prismasol BGK 4/2.5 400 mL/hr at 07/02/23 0144   sodium chloride Stopped (06/30/23 1935)   albumin human Stopped (06/23/23 0040)   epinephrine 7 mcg/min (07/02/23 0609)   feeding supplement (PIVOT 1.5 CAL) 60 mL/hr at 07/02/23 0600   lactated ringers 10 mL/hr at 07/02/23 0600   piperacillin-tazobactam Stopped (07/02/23 0536)   prismasol BGK 4/2.5 1,500 mL/hr at 07/02/23 0430   vancomycin Stopped (07/01/23 1751)   vasopressin 0.03 Units/min (07/02/23 0600)   sodium chloride, albumin human, heparin, ondansetron (ZOFRAN) IV, mouth rinse, oxyCODONE, phenol, sodium chloride, sodium chloride flush, traMADol

## 2023-07-02 NOTE — Progress Notes (Signed)
   Rounding Note    Patient Name: Ziyah Setterlund Date of Encounter: 07/02/2023  [facility] Cardiologist: None   Subjective   NAEO. Remains on Epi @ 7 and Vaso at 0.3  Vital Signs    Vitals:   07/02/23 0700 07/02/23 0715 07/02/23 0730 07/02/23 0756  BP:      Pulse: 80 80 80   Resp: (!) 27 (!) 26 (!) 29   Temp:    97.6 F (36.4 C)  TempSrc:    Oral  SpO2: 93% 95% 95%   Weight:      Height:        Intake/Output Summary (Last 24 hours) at 07/02/2023 0950 Last data filed at 07/02/2023 0700 Gross per 24 hour  Intake 3479.59 ml  Output 4966 ml  Net -1486.41 ml      07/02/2023    5:00 AM 07/01/2023    4:26 AM 06/30/2023    4:31 AM  Last 3 Weights  Weight (lbs) 193 lb 5.5 oz 192 lb 10.9 oz 210 lb 15.7 oz  Weight (kg) 87.7 kg 87.4 kg 95.7 kg      Telemetry    V paced - Personally Reviewed  ECG    Personally Reviewed  Physical Exam   GEN: Somnolent.  Cardiac: RRR, no murmurs, rubs, or gallops.   Assessment & Plan     #CHB S/p AVR/Tvr/Mvr/MAZE/LAAL He will require PPM prior to discharge. He is currently not stable enough to undergo EP procedures. Epi wires with stable threshold today (2mV). If this increases, he will need TVP via RIJ as a bridge to permanent pacemaker.   #ESRD CVVHD stopped today.  Plan to transition to IHD.    Sheria Lang T. Lalla Brothers, MD, [facility], [facility] Cardiac Electrophysiology

## 2023-07-02 NOTE — Progress Notes (Signed)
  Patient Name: [name] Date of Encounter: 07/03/2023   Primary Cardiologist: Reatha Harps, MD Electrophysiologist: Lanier Prude, MD  Interval Summary   Lethargic today. Stirs to questioning then goes back to sleep.   Pressors Remains on epi, now at 13, back up over weekend.   Vital Signs    Vitals:   07/03/23 0030 07/03/23 0045 07/03/23 0330 07/03/23 0500  BP:      Pulse: 82 81    Resp: 19 13    Temp:   98.4 F (36.9 C)   TempSrc:   Oral   SpO2: 97% 96%    Weight:    86.5 kg  Height:        Intake/Output Summary (Last 24 hours) at 07/03/2023 0740 Last data filed at 07/03/2023 0100 Gross per 24 hour  Intake 1993.39 ml  Output 20 ml  Net 1973.39 ml   Filed Weights   07/01/23 0426 07/02/23 0500 07/03/23 0500  Weight: 87.4 kg 87.7 kg 86.5 kg    Physical Exam    GEN- The patient is well appearing, alert and oriented x 3 today.   Lungs- Clear to ausculation bilaterally, normal work of breathing Cardiac- Regular rate and rhythm, no murmurs, rubs or gallops GI- soft, NT, ND, + BS Extremities- no clubbing or cyanosis. No edema  Telemetry    AV pacing in 80s (personally reviewed)  Hospital Course    52 y.o. male w/hx of Anxiety, depression, Bipolar disease, CHF, CKD s/p renal transplant 1999 -> redo 2015 -> ESRD/HD, and severe MR/severe TR/Moderate AS    Tricuspid Valve Repair, Mitral Valve Repair, Aortic Valve Replacement and MAZE 8/15    >> CHB pacing via epicardial wires  Assessment & Plan    CHB S/p AVR/TVr/MVr/MAZE/LAAC Epicardial wires with stable threshold over weekend.  If worsens, will need TVP as bridge to perm pacer.  ESRD Per primary team CVVHD -> Now plan for iHD.  Pt remains too unstable for PPM as he will need a leadless. He would otherwise be at a very high risk of infection. Will need to be off pressors and otherwise improved prior to consideration sedation for leadless.   For questions or updates, please contact [facility] Please consult [email or web address] for contact info under Cardiology/STEMI.  Signed, Graciella Freer, PA-C  07/03/2023, 7:40 AM

## 2023-07-02 NOTE — Progress Notes (Addendum)
NAME:  [name], MRN:  [id number], DOB:  1971-03-30, LOS: 10 ADMISSION DATE:  06/22/2023, CONSULTATION DATE: 06/22/2023 REFERRING MD: Leafy Ro - TCTS, CHIEF COMPLAINT: Postcardiac surgery  History of Present Illness:  This is a 52 year old gentleman, past medical history of anxiety depression, bipolar disease, congestive heart failure chronic diastolic heart failure kidney disease followed by renal transplant in 07-26-98 repeat renal transplant in 07-26-2014.  Patient underwent surgery today for severe mitral regurgitation severe tricuspid regurgitation and moderate aortic stenosis.  He also has atrial fibrillation received maze procedure.  He had a complex mitral valve repair a tricuspid valve annuloplasty and aortic valve replacement followed by full right and left maze procedure.  Pertinent Medical History:   Past Medical History:  Diagnosis Date   ADHD (attention deficit hyperactivity disorder)    Anemia    Anemia, chronic disease 07/13/2011   Anxiety    Arthritis    Bipolar 2 disorder (HCC) 08/18/2011   Diagnosed in 07/26/10.    Bipolar disorder (HCC)    Blood dyscrasia    Nephrotic Anemia   Blood transfusion    Blood transfusion without reported diagnosis    Chronic diastolic CHF (congestive heart failure) (HCC)    Complication of anesthesia    Woke up intubated gets combative  afraid to be alone    Congestive heart failure (CHF) (HCC) 07/13/2011   Onset 07-26-2005.  Followed by Dr. Eden Emms.  S/p cardiac catheterization in [facility] Dr. Anne Fu..  S/p cardiac catheterization in 07-26-13 by Nishan.  Echo 07-26-13.    Depression    Dysrhythmia    A. Fib 27-Jul-2023 with DCCV in May 2024   Erectile dysfunction    ESRD on hemodialysis (HCC) 07/13/2011   Glomerulonephritis at age 75, started HD in July 27, 1995.  Deceased donor renal transplant 1998-07-26 at [facility], then kidney failed and went back on HD in Jul 26, 2005.  L forearm AVF . s/p repeat renal transplant 10/2014 [facility].   GI bleed 11/01/2011   Rectal bleeding with  emesis/diarrhea    Heart murmur    Hemodialysis status (HCC)    Hydrocele, right 11/25/2014   Hypertension    Hypertensive emergency 01/06/2013   Hypoglycemia 01/11/2013   Influenza-like illness 12/18/2013   Mild ascending aorta dilatation (HCC)    NICM (nonischemic cardiomyopathy) (HCC)    Pericardial effusion    a. Mod by echo in 07-26-2013, similar to prior.   Peripheral polyneuropathy 08/04/2016   Pneumonia    Pulmonary edema 01/06/2013   Pulmonary hypertension associated with end stage renal disease on dialysis [facility]) 07/13/2011   Renal transplant, status post 11/25/2014   Pt with Glomerulonephritis at age 48. Deceased donor renal transplant 1998-07-26 right and left renal transplant 10/2014.  Followed by [facility] transplant team; nephrologist is Dr. Lonna Cobb.    Respiratory failure with hypoxia (HCC) 01/06/2013   Shortness of breath 12/12/2012   Swollen testicle 07/13/2011   URI (upper respiratory infection) 01/25/2012    Significant Hospital Events: Including procedures, antibiotic start and stop dates in addition to other pertinent events   8/15 - AVR, MVR, TVR with TCTS (Weldner). Remains on multiple pressors (NE, Epi, vaso). 8/16 - LIJ Trialysis catheter placed for CRRT.  8/17 extubated  8/18 - remains on CVVHD, 3 pressors  8/19-8/20 improving pressor needs 8/21 agitated delirium; cortrak  Interim History / Subjective:  Anxious and in pain last night. Failed swallow evaluation. Remains pacer dependent.   Objective:  Blood pressure (!) 91/18, pulse 82, temperature 98.1 F (36.7 C), temperature source  Oral, resp. rate (!) 24, height 6\' 3"  (1.905 m), weight 87.7 kg, SpO2 97%. CVP:  [8 mmHg-28 mmHg] 15 mmHg      Intake/Output Summary (Last 24 hours) at 07/02/2023 1144 Last data filed at 07/02/2023 1100 Gross per 24 hour  Intake 3635.53 ml  Output 4427.7 ml  Net -792.17 ml   Filed Weights   06/30/23 0431 07/01/23 0426 07/02/23 0500  Weight: 95.7 kg 87.4 kg 87.7 kg    Physical Examination: General: Lying comfortably in bed.  HEENT: Small bore feeding tube in place. Chest: clear to auscultation. Sternotomy well apposed.  Right small bore chest tube in place draining sanguinous fluid. CV extremities warm.  Right sided gallop. No edema. CVP 11. Fistula in both arm. AV pacing via epicardial leads on every beat.  Abdomen soft nontender. GU: Anuric.  Ancillary Tests Personally Reviewed:    Creatinine: 2.25 Na 133 Plt: 85 Echocardiogram 8/22: Robust LV function.  RV function is reduced. ScvO2 79% Assessment & Plan:   Mitral valve regurgitation, tricuspid regurgitation and aortic stenosis status post valvular repair x 3 Postoperative distributive shock and vasoplegia and  component of RV dysfunction. Right pleural effusion - ABLA, postoperative as expected.  No active bleeding at present. Thrombocytopenia, ?chronic- stable Postoperative junctional rhythm- now pacer dependent, eventual need for PPM Acute renal failure in the setting of hemodynamic insults on CKD History of kidney disease status post renal transplant Postoperative encephalopathy, ICU delirium- now seems to have cleared Situational anxiety. Moderate malnutrition Postoperative hypoxemic and hypercarbic respiratory failure- resolved.  History of bloody OG output - might have been TEE related, resolved. MSK position related cervical pain.   Plan:  - Confusion resolved. Will stop morning seroquel and add SSRI for anxiety.  - Wean vasopressin - target SBP of > 105. Continue midodrine -Continue epinephrine for RV support, target normal SCV O2. Keep at current dose until off vasopressin.  -Transition to iHD.  -Continue tube feeds currently at goal. -Complete 7-day course of antibiotics. -Transfuse for hemoglobin less than 8.1. -Transfuse platelets if active bleeding. - Tramadol/oxycodone for pain -PT consultation, start mobilizing.  - Pull right pigtail chest tube.   Best Practice  (right click and "Reselect all SmartList Selections" daily)   Diet/type:TF. Swallow evaluation now that he's more awake.  DVT prophylaxis:heparin ppx GI prophylaxis: PPI Lines: Central line Foley:  No  Code Status:  full code Last date of multidisciplinary goals of care discussion [Per Primary Team]  CRITICAL CARE Performed by: Lynnell Catalan   Total critical care time: 35 minutes  Critical care time was exclusive of separately billable procedures and treating other patients.  Critical care was necessary to treat or prevent imminent or life-threatening deterioration.  Critical care was time spent personally by me on the following activities: development of treatment plan with patient and/or surrogate as well as nursing, discussions with consultants, evaluation of patient's response to treatment, examination of patient, obtaining history from patient or surrogate, ordering and performing treatments and interventions, ordering and review of laboratory studies, ordering and review of radiographic studies, pulse oximetry, re-evaluation of patient's condition and participation in multidisciplinary rounds.  Lynnell Catalan, MD [facility] ICU Physician [facility] [place] Critical Care  Pager: [phone] Mobile: [phone] After hours: [phone].

## 2023-07-02 NOTE — Consult Note (Addendum)
Pharmacy Antibiotic Note  [name] is a 52 y.o. male being treated on 06/22/2023 with pneumonia and sepsis.  Pharmacy has been consulted for vancomycin and piperacillin-tazobactam dosing.   CRRT was stopped today at 745 AM  Random vancomycin level this afternoon is 26 - drawn ~ 20 hrs after last dose.  Pt not making urine.  Plan: Stop vancomycin.  Will redose after next HD session. Change Zosyn to 2.25g q 8 hrs while off CRRT. F/u LOT.  Height: 6\' 3"  (190.5 cm) Weight: 87.7 kg (193 lb 5.5 oz) IBW/kg (Calculated) : 84.5  Temp (24hrs), Avg:98 F (36.7 C), Min:97.6 F (36.4 C), Max:98.4 F (36.9 C)  Recent Labs  Lab 06/29/23 1201 06/29/23 1600 06/29/23 1710 06/30/23 0431 06/30/23 1630 07/01/23 0335 07/01/23 1629 07/02/23 0255  WBC  --   --  5.7 5.8 5.7 5.3  --  6.0  CREATININE  --    < >  --  2.39* 2.20* 2.20* 2.22* 2.25*  LATICACIDVEN 0.8  --   --   --   --   --   --   --    < > = values in this interval not displayed.    Estimated Creatinine Clearance: 46.9 mL/min (A) (by C-G formula based on SCr of 2.25 mg/dL (H)).    Allergies  Allergen Reactions   Delacort [Hydrocortisone Base] Nausea Only   Benicar [Olmesartan]     Hair loss   Diltiazem Other (See Comments)    Tachycardia   Food Itching    bananas   Hydralazine Other (See Comments)    Tachycardia   Nitrates, Organic Other (See Comments)    Palpitations, headache    Antimicrobials this admission: Vancomycin 8/22 >>  Piperacillin-tazobactam 8/22 >>   Dose adjustments this admission:   Microbiology results: 8/22 Sputum: obtained >rejected sample  Thank you for allowing pharmacy to be a part of this patient's care.  Reece Leader, Colon Flattery, BCCP Clinical Pharmacist  07/02/2023 1:07 PM   [facility] pharmacy phone numbers are listed on [email or web address]

## 2023-07-03 ENCOUNTER — Inpatient Hospital Stay (HOSPITAL_COMMUNITY): Payer: Medicare HMO

## 2023-07-03 DIAGNOSIS — R570 Cardiogenic shock: Secondary | ICD-10-CM | POA: Diagnosis not present

## 2023-07-03 DIAGNOSIS — I5023 Acute on chronic systolic (congestive) heart failure: Secondary | ICD-10-CM | POA: Diagnosis not present

## 2023-07-03 DIAGNOSIS — Z9889 Other specified postprocedural states: Secondary | ICD-10-CM | POA: Diagnosis not present

## 2023-07-03 DIAGNOSIS — E43 Unspecified severe protein-calorie malnutrition: Secondary | ICD-10-CM | POA: Insufficient documentation

## 2023-07-03 DIAGNOSIS — I442 Atrioventricular block, complete: Secondary | ICD-10-CM | POA: Diagnosis not present

## 2023-07-03 LAB — GLUCOSE, CAPILLARY
Glucose-Capillary: 116 mg/dL — ABNORMAL HIGH (ref 70–99)
Glucose-Capillary: 118 mg/dL — ABNORMAL HIGH (ref 70–99)
Glucose-Capillary: 120 mg/dL — ABNORMAL HIGH (ref 70–99)
Glucose-Capillary: 122 mg/dL — ABNORMAL HIGH (ref 70–99)
Glucose-Capillary: 126 mg/dL — ABNORMAL HIGH (ref 70–99)
Glucose-Capillary: 128 mg/dL — ABNORMAL HIGH (ref 70–99)
Glucose-Capillary: 161 mg/dL — ABNORMAL HIGH (ref 70–99)
Glucose-Capillary: 45 mg/dL — ABNORMAL LOW (ref 70–99)

## 2023-07-03 LAB — RENAL FUNCTION PANEL
Albumin: 2.5 g/dL — ABNORMAL LOW (ref 3.5–5.0)
Albumin: 2.7 g/dL — ABNORMAL LOW (ref 3.5–5.0)
Anion gap: 9 (ref 5–15)
Anion gap: 14 (ref 5–15)
BUN: 56 mg/dL — ABNORMAL HIGH (ref 6–20)
BUN: 67 mg/dL — ABNORMAL HIGH (ref 6–20)
CO2: 25 mmol/L (ref 22–32)
CO2: 21 mmol/L — ABNORMAL LOW (ref 22–32)
Calcium: 10.8 mg/dL — ABNORMAL HIGH (ref 8.9–10.3)
Calcium: 11 mg/dL — ABNORMAL HIGH (ref 8.9–10.3)
Chloride: 101 mmol/L (ref 98–111)
Chloride: 100 mmol/L (ref 98–111)
Creatinine, Ser: 4.21 mg/dL — ABNORMAL HIGH (ref 0.61–1.24)
Creatinine, Ser: 4.65 mg/dL — ABNORMAL HIGH (ref 0.61–1.24)
GFR, Estimated: 16 mL/min — ABNORMAL LOW (ref >60)
GFR, Estimated: 15 mL/min — ABNORMAL LOW (ref >60)
Glucose, Bld: 175 mg/dL — ABNORMAL HIGH (ref 70–99)
Glucose, Bld: 124 mg/dL — ABNORMAL HIGH (ref 70–99)
Phosphorus: 4.5 mg/dL (ref 2.5–4.6)
Phosphorus: 5 mg/dL — ABNORMAL HIGH (ref 2.5–4.6)
Potassium: 5.1 mmol/L (ref 3.5–5.1)
Potassium: 5.3 mmol/L — ABNORMAL HIGH (ref 3.5–5.1)
Sodium: 135 mmol/L (ref 135–145)
Sodium: 135 mmol/L (ref 135–145)

## 2023-07-03 LAB — MAGNESIUM: Magnesium: 2.9 mg/dL — ABNORMAL HIGH (ref 1.7–2.4)

## 2023-07-03 LAB — COOXEMETRY PANEL
Carboxyhemoglobin: 3 % — ABNORMAL HIGH (ref 0.5–1.5)
Methemoglobin: <0.7 % (ref 0.0–1.5)
O2 Saturation: 88.1 %
Total hemoglobin: 8.9 g/dL — ABNORMAL LOW (ref 12.0–16.0)

## 2023-07-03 LAB — CBC
HCT: 28 % — ABNORMAL LOW (ref 39.0–52.0)
Hemoglobin: 8.5 g/dL — ABNORMAL LOW (ref 13.0–17.0)
MCH: 32.1 pg (ref 26.0–34.0)
MCHC: 30.4 g/dL (ref 30.0–36.0)
MCV: 105.7 fL — ABNORMAL HIGH (ref 80.0–100.0)
Platelets: 146 10*3/uL — ABNORMAL LOW (ref 150–400)
RBC: 2.65 MIL/uL — ABNORMAL LOW (ref 4.22–5.81)
RDW: 18.6 % — ABNORMAL HIGH (ref 11.5–15.5)
WBC: 11.5 10*3/uL — ABNORMAL HIGH (ref 4.0–10.5)
nRBC: 0.4 % — ABNORMAL HIGH (ref 0.0–0.2)

## 2023-07-03 LAB — HEPATIC FUNCTION PANEL
ALT: 12 U/L (ref 0–44)
AST: 48 U/L — ABNORMAL HIGH (ref 15–41)
Albumin: 2.5 g/dL — ABNORMAL LOW (ref 3.5–5.0)
Alkaline Phosphatase: 98 U/L (ref 38–126)
Bilirubin, Direct: 0.3 mg/dL — ABNORMAL HIGH (ref 0.0–0.2)
Indirect Bilirubin: 0.9 mg/dL (ref 0.3–0.9)
Total Bilirubin: 1.2 mg/dL (ref 0.3–1.2)
Total Protein: 8.1 g/dL (ref 6.5–8.1)

## 2023-07-03 LAB — PROCALCITONIN: Procalcitonin: 7.2 ng/mL

## 2023-07-03 LAB — C-REACTIVE PROTEIN: CRP: 7.9 mg/dL — ABNORMAL HIGH (ref <1.0)

## 2023-07-03 MED ORDER — COLCHICINE 0.3 MG HALF TABLET
0.3000 mg | ORAL_TABLET | ORAL | Status: DC
  Filled 2023-07-03: qty 1

## 2023-07-03 MED ORDER — PIVOT 1.5 CAL PO LIQD
1000.0000 mL | ORAL | Status: DC
  Administered 2023-07-03 – 2023-07-04 (×3): 1000 mL
  Filled 2023-07-03: qty 1000

## 2023-07-03 MED ORDER — QUETIAPINE FUMARATE 25 MG PO TABS
25.0000 mg | ORAL_TABLET | Freq: Every day | ORAL | Status: DC
  Administered 2023-07-03 – 2023-07-10 (×8): 25 mg
  Filled 2023-07-03 (×9): qty 1

## 2023-07-03 MED ORDER — BISACODYL 5 MG PO TBEC: 10.0000 mg | DELAYED_RELEASE_TABLET | Freq: Every day | ORAL | Status: DC | PRN

## 2023-07-03 MED ORDER — NEPRO/CARBSTEADY PO LIQD
1000.0000 mL | ORAL | Status: DC
  Administered 2023-07-03: 1000 mL

## 2023-07-03 MED ORDER — THIAMINE MONONITRATE 100 MG PO TABS
100.0000 mg | ORAL_TABLET | Freq: Every day | ORAL | Status: DC
  Administered 2023-07-03 – 2023-07-05 (×3): 100 mg
  Filled 2023-07-03 (×3): qty 1

## 2023-07-03 MED ORDER — PROSOURCE TF20 ENFIT COMPATIBL EN LIQD
60.0000 mL | Freq: Every day | ENTERAL | Status: DC
  Administered 2023-07-04 – 2023-07-07 (×4): 60 mL
  Filled 2023-07-03 (×4): qty 60

## 2023-07-03 MED ORDER — OXIDIZED CELLULOSE EX PADS
2.0000 | MEDICATED_PAD | Freq: Once | CUTANEOUS | Status: AC
  Administered 2023-07-03: 2 via TOPICAL
  Filled 2023-07-03: qty 2

## 2023-07-03 MED ORDER — FOLIC ACID 1 MG PO TABS
1.0000 mg | ORAL_TABLET | Freq: Every day | ORAL | Status: DC
  Administered 2023-07-03 – 2023-07-05 (×3): 1 mg
  Filled 2023-07-03 (×3): qty 1

## 2023-07-03 MED ORDER — DARBEPOETIN ALFA 100 MCG/0.5ML IJ SOSY
100.0000 ug | PREFILLED_SYRINGE | INTRAMUSCULAR | Status: DC
  Administered 2023-07-04: 100 ug via SUBCUTANEOUS
  Filled 2023-07-03: qty 0.5

## 2023-07-03 MED ORDER — POLYETHYLENE GLYCOL 3350 17 G PO PACK
17.0000 g | PACK | Freq: Every day | ORAL | Status: DC | PRN
  Filled 2023-07-03: qty 1

## 2023-07-03 MED ORDER — PRISMASOL BGK 4/2.5 32-4-2.5 MEQ/L REPLACEMENT SOLN: Status: DC

## 2023-07-03 MED ORDER — PIPERACILLIN-TAZOBACTAM 3.375 G IVPB
3.3750 g | Freq: Four times a day (QID) | INTRAVENOUS | Status: AC
  Administered 2023-07-03 – 2023-07-05 (×10): 3.375 g via INTRAVENOUS
  Filled 2023-07-03 (×19): qty 50

## 2023-07-03 MED ORDER — SODIUM ZIRCONIUM CYCLOSILICATE 10 G PO PACK
10.0000 g | PACK | Freq: Once | ORAL | Status: AC
  Administered 2023-07-03: 10 g via ORAL
  Filled 2023-07-03: qty 1

## 2023-07-03 MED ORDER — PRISMASOL BGK 0/2.5 32-2.5 MEQ/L EC SOLN
Status: DC
  Filled 2023-07-03 (×9): qty 5000

## 2023-07-03 MED ORDER — COLCHICINE 0.3 MG HALF TABLET: 0.3000 mg | ORAL_TABLET | ORAL | Status: DC

## 2023-07-03 MED ORDER — VANCOMYCIN HCL 1250 MG/250ML IV SOLN
1250.0000 mg | INTRAVENOUS | Status: DC
  Filled 2023-07-03: qty 250

## 2023-07-03 MED ORDER — HEPARIN SODIUM (PORCINE) 1000 UNIT/ML DIALYSIS
1000.0000 [IU] | INTRAMUSCULAR | Status: DC | PRN
  Administered 2023-07-04 (×2): 1400 [IU] via INTRAVENOUS_CENTRAL
  Administered 2023-07-05: 2800 [IU] via INTRAVENOUS_CENTRAL
  Administered 2023-07-08: 3000 [IU] via INTRAVENOUS_CENTRAL
  Administered 2023-07-11 (×2): 1400 [IU] via INTRAVENOUS_CENTRAL
  Administered 2023-07-12 – 2023-07-14 (×3): 2800 [IU] via INTRAVENOUS_CENTRAL
  Filled 2023-07-03: qty 2
  Filled 2023-07-03: qty 5
  Filled 2023-07-03 (×3): qty 6
  Filled 2023-07-03: qty 2
  Filled 2023-07-03: qty 1
  Filled 2023-07-03 (×2): qty 6
  Filled 2023-07-03 (×2): qty 3
  Filled 2023-07-03 (×3): qty 6
  Filled 2023-07-03: qty 3
  Filled 2023-07-03: qty 6
  Filled 2023-07-03: qty 3
  Filled 2023-07-03: qty 6
  Filled 2023-07-03: qty 4

## 2023-07-03 MED ORDER — BISACODYL 10 MG RE SUPP: 10.0000 mg | Freq: Every day | RECTAL | Status: DC | PRN

## 2023-07-03 NOTE — Consult Note (Addendum)
Pharmacy Antibiotic Note  [name] is a 52 y.o. male being treated on 06/22/2023 with pneumonia and sepsis.  Pharmacy has been consulted for vancomycin and piperacillin-tazobactam dosing.   CRRT was resumed 8/26 AM, antibiotics adjusted to appropriate dosing.  Will discontinue MRSA coverage with vancomycin at this time after 5 days of therapy.  Patient is on day 5 of 7 of Zosyn. Remains afebrile and WBC count is stable.  Plan: Resume vancomycin 1250 mg every 24 hours while on CRRT. Increase Zosyn to 3.375 g q8 hrs while on CRRT.  Height: 6\' 3"  (190.5 cm) Weight: 86.5 kg (190 lb 11.2 oz) IBW/kg (Calculated) : 84.5  Temp (24hrs), Avg:98.2 F (36.8 C), Min:98 F (36.7 C), Max:98.4 F (36.9 C)  Recent Labs  Lab 06/29/23 1201 06/29/23 1600 06/30/23 0431 06/30/23 1630 07/01/23 0335 07/01/23 1629 07/02/23 0255 07/02/23 1326 07/03/23 0417  WBC  --    < > 5.8 5.7 5.3  --  6.0  --  11.5*  CREATININE  --    < > 2.39* 2.20* 2.20* 2.22* 2.25*  --  4.21*  LATICACIDVEN 0.8  --   --   --   --   --   --   --   --   VANCORANDOM  --   --   --   --   --   --   --  26  --    < > = values in this interval not displayed.    Estimated Creatinine Clearance: 25.1 mL/min (A) (by C-G formula based on SCr of 4.21 mg/dL (H)).    Allergies  Allergen Reactions   Delacort [Hydrocortisone Base] Nausea Only   Benicar [Olmesartan]     Hair loss   Diltiazem Other (See Comments)    Tachycardia   Food Itching    bananas   Hydralazine Other (See Comments)    Tachycardia   Nitrates, Organic Other (See Comments)    Palpitations, headache    Antimicrobials this admission: Vancomycin 8/22 >>  Piperacillin-tazobactam 8/22 >>   Dose adjustments this admission: 8/25--CRRT stopped--vancomycin held, Zosyn reduced to 2.25 gm every 8 hours 8/26--CRRT resumed--Zosyn increased to 3.375 gm every 8 hours + vancomycin stopped  Microbiology results: 8/22 Sputum: obtained >rejected sample  Thank you for  allowing pharmacy to be a part of this patient's care.  Wilmer Floor, PharmD PGY2 Cardiology Pharmacy Resident  07/03/2023 10:32 AM   [facility] pharmacy phone numbers are listed on [email or web address]

## 2023-07-03 NOTE — Progress Notes (Addendum)
[address]       [place] [address]             [phone]      11 Days Post-Op Procedure(s) (LRB): TRICUSPID VALVE REPAIR MC3 Ring size 30 (N/A) MITRAL VALVE REPAIR (MVR) utilizing Simulis Band size 30mm (N/A) AORTIC VALVE REPLACEMENT (AVR) inspiris valve size 27 (N/A) MAZE (N/A) TRANSESOPHAGEAL ECHOCARDIOGRAM (N/A)  Subjective:  Patient w/o new complaints.  More alert than my last interaction with patient.  Oral intake remains poor.  Objective: Vital signs in last 24 hours: Temp:  [98 F (36.7 C)-98.4 F (36.9 C)] 98.3 F (36.8 C) (08/26 0728) Pulse Rate:  [80-83] 82 (08/26 0830) Cardiac Rhythm: A-V Sequential paced (08/26 0800) Resp:  [13-33] 25 (08/26 0830) SpO2:  [90 %-100 %] 96 % (08/26 0830) Arterial Line BP: (79-139)/(28-75) 115/43 (08/26 0830) Weight:  [86.5 kg] 86.5 kg (08/26 0500)  Hemodynamic parameters for last 24 hours: CVP:  [10 mmHg-27 mmHg] 17 mmHg  Intake/Output from previous day: 08/25 0701 - 08/26 0700 In: 1993.4 [I.V.:583.3; NG/GT:1310; IV Piggyback:100.1] Out: 20 [Chest Tube:20] Intake/Output this shift: Total I/O In: 836.5 [I.V.:367.5; NG/GT:419; IV Piggyback:50] Out: -   General appearance: cooperative and no distress Heart: regular rate and rhythm and paced, +murmur Lungs: diminished breath sounds bibasilar Abdomen: soft, non-tender; bowel sounds normal; no masses,  no organomegaly Extremities: extremities normal, atraumatic, no cyanosis or edema Wound: clean and dry  Lab Results: Recent Labs    07/02/23 0255 07/03/23 0417  WBC 6.0 11.5*  HGB 8.1* 8.5*  HCT 26.5* 28.0*  PLT 85* 146*   BMET:  Recent Labs    07/02/23 0255 07/03/23 0417  NA 133* 135  K 4.2 5.1  CL 100 101  CO2 24 25  GLUCOSE 151* 175*  BUN 27* 56*  CREATININE 2.25* 4.21*  CALCIUM 9.9 10.8*    PT/INR: No results for input(s): "LABPROT", "INR" in the last 72 hours. ABG    Component Value Date/Time   PHART 7.305 (L) 06/28/2023  0756   HCO3 26.3 06/28/2023 0756   TCO2 28 06/28/2023 0756   ACIDBASEDEF 1.0 06/28/2023 0756   O2SAT 88.1 07/03/2023 0417   CBG (last 3)  Recent Labs    07/03/23 0016 07/03/23 0325 07/03/23 0731  GLUCAP 126* 161* 120*    Assessment/Plan: S/P Procedure(s) (LRB): TRICUSPID VALVE REPAIR MC3 Ring size 30 (N/A) MITRAL VALVE REPAIR (MVR) utilizing Simulis Band size 30mm (N/A) AORTIC VALVE REPLACEMENT (AVR) inspiris valve size 27 (N/A) MAZE (N/A) TRANSESOPHAGEAL ECHOCARDIOGRAM (N/A)  CV- CHB, remains on Epi @ 13 weaning as tolerated.. not a candidate for PPM at this time.. too unstable Pulm- no acute issues, continue IS Renal- ESRD, currently off CRRT.. hoping to start HD this evening of patient can wean off drips.. if not will need to resume CRRT.. Nephrology is following Gout- started on colchicine yesterday, as discussed with pharmacy in setting of renal issues they will adjust dose, continue to for now.. I do not think this is source of diarrhea ID- continue 7 days of ABX GI- poor oral intake, Cortrak in place, continue tube feedings, encourage oral intake.. failed SLP evaluation on 8/25 Deconditioning- severe, unable to stand to weigh today due to dizziness and required full assistance.. will get PT/OT consults   LOS: 11 days    Lowella Dandy, PA-C 07/03/2023  Patient examined, last CXR image reviewed.  He is more responsive, engaged. CVVH has been off > 24 hrs but  CVP up > 20 His epi requirement has increased the last 12 hrs and WBC up . Sternal incision clean, dry CHB with V-wire threshold stable 2.5 ma Will check blood cultures and pull internal jugular sleeve placed in OR - use central port in trialysis catheter for epi and have IV team place midline cath if peripheral access needed. May need to resume CRT tro remove volume  patient examined and medical record reviewed,agree with above note. Lovett Sox 07/03/2023

## 2023-07-03 NOTE — Progress Notes (Signed)
eLink Physician-Brief Progress Note Patient Name: [name] DOB: [birth year] MRN: [id number]   Date of Service  07/03/2023  HPI/Events of Note  52 year old male with chronic HFrEF, severe mitral regurgitation, severe tricuspid regurgitation and moderate aortic stenosis who underwent mitral valve repair, tricuspid valve annuloplasty and aortic valve replacement, complicated with cardiogenic shock and AKI on CKD stage IIIa   Noted to have rare failure to capture on epicardial wires. Capturing 95%+ of the time on tele review. Set rate 82, Aoutput 12mA, Voutput 10mA       eICU Interventions  Maintain current PM settings. Hemodynamically stable, mentating appropriately, no overall change.     Intervention Category Intermediate Interventions: Arrhythmia - evaluation and management  Londell Noll 07/03/2023, 10:30 PM

## 2023-07-03 NOTE — Progress Notes (Signed)
NAME:  [name], MRN:  [id number], DOB:  1971/06/01, LOS: 11 ADMISSION DATE:  06/22/2023, CONSULTATION DATE: 06/22/2023 REFERRING MD: Leafy Ro - TCTS, CHIEF COMPLAINT: Postcardiac surgery  History of Present Illness:  This is a 52 year old gentleman, past medical history of anxiety depression, bipolar disease, congestive heart failure chronic diastolic heart failure kidney disease followed by renal transplant in 07/25/1998 repeat renal transplant in 07-25-14.  Patient underwent surgery today for severe mitral regurgitation severe tricuspid regurgitation and moderate aortic stenosis.  He also has atrial fibrillation received maze procedure.  He had a complex mitral valve repair a tricuspid valve annuloplasty and aortic valve replacement followed by full right and left maze procedure.  Pertinent Medical History:   Past Medical History:  Diagnosis Date   ADHD (attention deficit hyperactivity disorder)    Anemia    Anemia, chronic disease 07/13/2011   Anxiety    Arthritis    Bipolar 2 disorder (HCC) 08/18/2011   Diagnosed in 07/25/10.    Bipolar disorder (HCC)    Blood dyscrasia    Nephrotic Anemia   Blood transfusion    Blood transfusion without reported diagnosis    Chronic diastolic CHF (congestive heart failure) (HCC)    Complication of anesthesia    Woke up intubated gets combative  afraid to be alone    Congestive heart failure (CHF) (HCC) 07/13/2011   Onset 2005/07/25.  Followed by Dr. Eden Emms.  S/p cardiac catheterization in [facility] Dr. Anne Fu..  S/p cardiac catheterization in 07/25/13 by Nishan.  Echo July 25, 2013.    Depression    Dysrhythmia    A. Fib 2023-07-26 with DCCV in May 2024   Erectile dysfunction    ESRD on hemodialysis (HCC) 07/13/2011   Glomerulonephritis at age 73, started HD in 07-26-1995.  Deceased donor renal transplant Jul 25, 1998 at [facility], then kidney failed and went back on HD in 2005/07/25.  L forearm AVF . s/p repeat renal transplant 10/2014 [facility].   GI bleed 11/01/2011   Rectal bleeding with  emesis/diarrhea    Heart murmur    Hemodialysis status (HCC)    Hydrocele, right 11/25/2014   Hypertension    Hypertensive emergency 01/06/2013   Hypoglycemia 01/11/2013   Influenza-like illness 12/18/2013   Mild ascending aorta dilatation (HCC)    NICM (nonischemic cardiomyopathy) (HCC)    Pericardial effusion    a. Mod by echo in 07-25-13, similar to prior.   Peripheral polyneuropathy 08/04/2016   Pneumonia    Pulmonary edema 01/06/2013   Pulmonary hypertension associated with end stage renal disease on dialysis [facility]) 07/13/2011   Renal transplant, status post 11/25/2014   Pt with Glomerulonephritis at age 64. Deceased donor renal transplant July 25, 1998 right and left renal transplant 10/2014.  Followed by [facility] transplant team; nephrologist is Dr. Lonna Cobb.    Respiratory failure with hypoxia (HCC) 01/06/2013   Shortness of breath 12/12/2012   Swollen testicle 07/13/2011   URI (upper respiratory infection) 01/25/2012    Significant Hospital Events: Including procedures, antibiotic start and stop dates in addition to other pertinent events   8/15 - AVR, MVR, TVR with TCTS (Weldner). Remains on multiple pressors (NE, Epi, vaso). 8/16 - LIJ Trialysis catheter placed for CRRT.  8/17 extubated  8/18 - remains on CVVHD, 3 pressors  8/19-8/20 improving pressor needs 8/21 agitated delirium; cortrak  Interim History / Subjective:  NAEON. A bit sleepy this AM but awakens to voice and follows basic commands.  Remains on Epi at 13.  Objective:  Blood pressure (!) 91/18, pulse 82,  temperature 98.3 F (36.8 C), temperature source Oral, resp. rate (!) 25, height 6\' 3"  (1.905 m), weight 86.5 kg, SpO2 96%. CVP:  [10 mmHg-27 mmHg] 17 mmHg      Intake/Output Summary (Last 24 hours) at 07/03/2023 0916 Last data filed at 07/03/2023 0913 Gross per 24 hour  Intake 2544.63 ml  Output 20 ml  Net 2524.63 ml   Filed Weights   07/01/23 0426 07/02/23 0500 07/03/23 0500  Weight: 87.4 kg 87.7 kg  86.5 kg   Physical Examination: General: Adult male, resting in bed, in NAD. Neuro: Sleepy but awakens easily to voice, answers questions appropriately, follows basic commands. HEENT: [place]/AT. Sclerae anicteric. EOMI. Cardiovascular: AV pacing via epicardial wires. 3/6 SEM.  Lungs: Respirations even and unlabored.  CTA bilaterally, No W/R/R. Abdomen: BS x 4, soft, NT/ND.  Musculoskeletal: No gross deformities, no edema.  Skin: Intact, warm, no rashes.  Assessment & Plan:   Mitral valve regurgitation, tricuspid regurgitation and aortic stenosis status post valvular repair x 3 Postoperative junctional rhythm - now pacer dependent, eventual need for PPM Postoperative distributive shock and vasoplegia and  component of RV dysfunction. AoC sCHF - intra-op TEE with EF 45% (on 4 epi), RV mod-severely reduced,  - TCTS managing. - Continue Epi, wean as able. - Continue Midodrine. - EP on board, might require TVP with eventual leadless PPM at some point (not stable enough yet). - PT eval pending.  Possible sepsis - WBC spike 6 to 11. - Continue Zosyn for now. - Repeat PCT.  Right pleural effusion - s/p pigtail chest tube with subsequent removal 8/25. - Supportive care. - Push IS.  Acute renal failure in the setting of hemodynamic insults on CKD History of kidney disease status post renal transplant. - Nephrology following. - Possible iHD today versus re-initiation of CRRT.  ABLA, postoperative as expected.  No active bleeding at present. Thrombocytopenia, ?chronic- stable - Transfuse for Hgb < 7. - Monitor for bleeding.  Postoperative encephalopathy, ICU delirium- now seems to have cleared Situational anxiety. - Continue Escitalopram, Seroquel at bedtime.  Moderate malnutrition - TF's. - SLP following, recommending NPO as of 8/25.  Possible gout flare. - Continue Colchicine.   Best Practice (right click and "Reselect all SmartList Selections" daily)   Diet/type:TF. DVT  prophylaxis:heparin ppx GI prophylaxis: PPI Lines: Central line Foley:  No  Code Status:  full code Last date of multidisciplinary goals of care discussion [Per Primary Team]   CC time: 30 min.   Rutherford Guys, PA - [facility] For pager details, please see AMION or use Epic chat  After 1900, please call [facility] for cross coverage needs 07/03/2023, 9:37 AM

## 2023-07-03 NOTE — Progress Notes (Signed)
Nutrition Follow-up  DOCUMENTATION CODES:   Severe malnutrition in context of chronic illness  INTERVENTION:   Tube Feeding via Cortrak:  Pivot 1.5 at 65 ml/hr Pro-Source TF20 60 mL daily TF regimen at goal provides 2420 kcals, 166 g of protein and 1094 mL of free water   Continue Renal MVI  Add Folic Acid 1 mg daily given serum lab value indicating deficiency; Add Thiamine 100 mg daily x 10 days given severe malnutrition, risk for deficiency  Given severity of malnutrition with hx of dialysis >20 years, acute illness requiring CRRT,   NUTRITION DIAGNOSIS:   Severe Malnutrition related to chronic illness as evidenced by severe muscle depletion, severe fat depletion.  Being addressed via TF   GOAL:   Patient will meet greater than or equal to 90% of their needs  Met via TF  MONITOR:   PO intake, TF tolerance, Labs, Weight trends  REASON FOR ASSESSMENT:   Consult Assessment of nutrition requirement/status  ASSESSMENT:   52 y/o male with h/o glomerulonephritis (age 59) s/p renal transplant (1999) and right and left renal transplants (2015), ESRD on HD (>20 yrs), HTN, bipolar 2 disorder, PTSD, ADHD, anxiety, depression, GERD, OSA, NICM, thrombocytopenia, pulmonary hypertension, afib s/p DCCV (03/2019), CHF, severe mitral and tricuspid valve regurgitation and moderate aortic stenosis s/p mitral valve repair, tricuspid valve annuloplasty, aortic valve replacement and full right and left maze procedure 8/16.  8/15 AVR, MVR, TVR by Dr. Leafy Ro 8/16 CRRT initiated 8/17 Extubated, CL diet 8/19 Chest tube inserted for effusion, BiPap overnight 8/20 No BM, abd xray with nonobstructive bowel gas pattern 8/21 Cortrak placed, TF initiated 8/22 NPO due to mental status, TF continue 8/25 CRRT discontinued  Noted plan to resume CRRT, CVP up to >20 Pt arousable but lethargic. Pt does not really participate in exam Off Vasopressin, remains on epinephrine at 13  Pt severely  deconditioned, weak. Max assist with standing. PT/OT following  Tolerating TF Pivot 1.5 at 60 via Cortrak, remains NPO  Noted Nephrologist changed TF to Nepro this AM due to rising K+ (5.1 this AM); also noted pt with emesis yesterday. Per RN, very small amount of "emesis" this AM  +BM finally, multiple type 7 stools after very long period of constipation.   Potassium 5.1, received 1 x dose of lokelma. Now with pt going back on CRRT, recommend changing back to Pivot formula  Sternal incision clean, dry per MD  Wt this AM 85.6 kg (lowest wt this admission); admit wt around 94 kg which is reported outpatient EDW. Pt is well below EDW. RD repeated Nutrition Focused Physical Exam and noted severe muscle and fat depletions.   Abdomen obese but soft, except for bilateral lower quadrants where RD noted hard mass like palpable spots; per RN, this are the transplant kidneys  Micronutrient Labs: Folate, serum: 3.6 (L) 06/26/23 Iron, serum: 121(wdl) Vitamin B12:  1175 (H) 06/26/23  Labs: potassium 5.1 (wdl), phosphorus 4.5 (wdl), magnesium 2.9 (H), sodium 135 (wdl) Meds: aranesp, ss novolog, rena-vite  NUTRITION - FOCUSED PHYSICAL EXAM:  Flowsheet Row Most Recent Value  Orbital Region Severe depletion  Upper Arm Region Severe depletion  Thoracic and Lumbar Region Unable to assess  Buccal Region Moderate depletion  Temple Region Severe depletion  Clavicle Bone Region Severe depletion  Clavicle and Acromion Bone Region Severe depletion  Scapular Bone Region Severe depletion  Dorsal Hand Severe depletion  Patellar Region Unable to assess  Anterior Thigh Region Unable to assess  Posterior Calf Region Unable to  assess  Edema (RD Assessment) Mild       Diet Order:   Diet Order             Diet NPO time specified Except for: Ice Chips  Diet effective now                   EDUCATION NEEDS:   Not appropriate for education at this time  Skin:  Skin Assessment: Skin Integrity  Issues: Skin Integrity Issues:: Incisions Incisions: sternal  Last BM:  8/26 via rectal tube  Height:   Ht Readings from Last 1 Encounters:  06/22/23 6\' 3"  (1.905 m)    Weight:   Wt Readings from Last 1 Encounters:  07/03/23 86.5 kg    Ideal Body Weight:  89 kg  BMI:  Body mass index is 23.84 kg/m.  Estimated Nutritional Needs:   Kcal:  2300-2500 kcals  Protein:  160-195 g  Fluid:  1L plus UOP   [name] MS, RDN, LDN, CNSC Registered Dietitian 3 Clinical Nutrition RD Pager and On-Call Pager Number Located in [place]

## 2023-07-03 NOTE — Progress Notes (Signed)
Inpatient Rehab Admissions Coordinator:   Per Megan Salon, MS, CCC-SLP patient was screened for CIR candidacy by Megan Salon, MS, CCC-SLP,  At this time, Pt. is not medically ready for CIR, is on  CRRT at this time. Pt. Also is not yet attempting OOB with therapies so is not at a level to tolerate the intensity of CIR. I will not pursue a rehab consult for this Pt. at this time, but CIR admissions team will follow and monitor for medical readiness and place consult order if Pt. appears to be an appropriate candidate. Please contact me with any questions.   Megan Salon, MS, CCC-SLP Rehab Admissions Coordinator  [phone] (celll) [phone] (office)

## 2023-07-03 NOTE — Progress Notes (Signed)
[facility] Progress Note  Subjective:  He is s/p CRRT - ended on 8/25 per charting.  Had no UF over 8/25 charted.  He has remained in ICU.  He shares that he does MWF HD and is a nocturnal patient. Per RN he was on epi at 7 mcg/min last night and is now at 14 mcg/min.  Sats were low 90's off of oxygen and now on 1 liter per RN  Review of systems:  He reports some shortness of breath - he asked to use his incentive spirometer and this was brought to bedside  He had nausea emesis yesterday x 2  Post-op discomfort   Vitals:   07/03/23 0015 07/03/23 0030 07/03/23 0045 07/03/23 0330  BP:      Pulse: 82 82 81   Resp: 20 19 13    Temp:    98.4 F (36.9 C)  TempSrc:    Oral  SpO2: 98% 97% 96%   Weight:      Height:        Physical Exam:  General adult male in bed in no acute distress HEENT normocephalic atraumatic extraocular movements intact sclera anicteric Neck supple trachea midline Lungs clear to auscultation bilaterally normal work of breathing at rest doing Incentive spirometer intermittently now; on 1 liter nasal cannula   Heart S1S2 no rub Abdomen soft nontender nondistended Extremities no edema  Psych normal mood and affect Neuro - alert and oriented x 3 he follows commands and provides a history  Access RUE AVF with bruit and thrill; left internal jugular nontunneled dialysis catheter      OP HD: [place] triad MWF nocturnal 3h    F250     94kg   2K/2.5 Ca bath   RUE AVF    Summary: [name] is a/an 52 y.o. male with a past medical history notable for ESRD on HD admitted with severe MR and TR and aortic stenosis s/p repair/replacement.    Assessment/ Plan # Severe MR and TR and aortic stenosis - Status post Tri valve repair on 8/15.  Per cardiothoracic surgery  # Complete heart block - post-op, per EP/ cards.   # ESRD - pt was too hypotensive for hemodialysis post-op.  He is s/p CRRT 8/16 - 8/25.   - No emergent need for HD today.  Plan for HD  tomorrow if hemodynamics permit - if not then may need CRRT again.  - Will transition to nepro for feeds as potassium is rising.  Lokelma once today.   - Assess dialysis needs daily.    # Distributive Shock postoperatively - Pressors per primary team.  On midodrine.  Fluid volume overload is improved.  Weight 87.7 kg on 8/25 (from 95 kg on 8/15).    # Anemia of ESRD - transfuse as needed per primary team.  Concurrent thrombocytopenia improved.    # MBD ckd - hypercalcemia. Not taking any binders presently.  Changed to nepro as above. Phos acceptable.    # Vascular access - RUE AVF functioning well but had been unable to use due to shock.  Temporary dialysis catheter placed for CRRT.      Recent Labs  Lab 07/02/23 0255 07/03/23 0417  HGB 8.1* 8.5*  ALBUMIN 2.6*  2.5* 2.5*  2.5*  CALCIUM 9.9 10.8*  PHOS 2.3* 4.5  CREATININE 2.25* 4.21*  K 4.2 5.1   Recent Labs  Lab 06/26/23 0754  IRON 121  TIBC 133*  FERRITIN 1,444*   Inpatient medications:  acyclovir  200 mg Per Tube BID   atorvastatin  10 mg Per Tube QHS   bisacodyl  10 mg Oral Daily   Or   bisacodyl  10 mg Rectal Daily   Chlorhexidine Gluconate Cloth  6 each Topical Daily   colchicine  0.3 mg Oral Daily   docusate  100 mg Per Tube BID   escitalopram  10 mg Oral QHS   feeding supplement (PROSource TF20)  60 mL Per Tube BID   guaiFENesin  15 mL Oral BID   heparin injection (subcutaneous)  5,000 Units Subcutaneous Q8H   insulin aspart  0-24 Units Subcutaneous Q4H   lidocaine-prilocaine  1 Application Topical Q M,W,F   midodrine  10 mg Per Tube TID   multivitamin  1 tablet Per Tube QHS   mouth rinse  15 mL Mouth Rinse 4 times per day   pantoprazole (PROTONIX) IV  40 mg Intravenous Q12H   polyethylene glycol  17 g Per Tube BID   QUEtiapine  25 mg Per Tube BID   sodium chloride flush  3 mL Intravenous Q12H   vancomycin variable dose per unstable renal function (pharmacist dosing)   Does not apply See admin  instructions    sodium chloride Stopped (06/30/23 1935)   albumin human Stopped (06/23/23 0040)   epinephrine 15 mcg/min (07/03/23 0429)   feeding supplement (PIVOT 1.5 CAL) 60 mL/hr at 07/03/23 0100   lactated ringers 10 mL/hr at 07/03/23 0100   piperacillin-tazobactam (ZOSYN)  IV Stopped (07/02/23 2110)   vasopressin Stopped (07/02/23 1536)   sodium chloride, albumin human, clonazePAM, ondansetron (ZOFRAN) IV, mouth rinse, oxyCODONE, phenol, sodium chloride flush, traMADol     Estanislado Emms, MD 6:14 AM 07/03/2023

## 2023-07-03 NOTE — Procedures (Signed)
Central Venous Catheter Insertion Procedure Note  Myshon Zwahlen  [id number]  Jan 14, 1971  Date:07/03/23  Time:11:02 AM   Provider Performing:Gershom Brobeck   Procedure: Insertion of Non-tunneled Central Venous [phone]) with US guidance (32951)   Indication(s) Medication administration  Consent Risks of the procedure as well as the alternatives and risks of each were explained to the patient and/or caregiver.  Consent for the procedure was obtained and is signed in the bedside chart  Anesthesia Topical only with 1% lidocaine   Timeout Verified patient identification, verified procedure, site/side was marked, verified correct patient position, special equipment/implants available, medications/allergies/relevant history reviewed, required imaging and test results available.  Sterile Technique Maximal sterile technique including full sterile barrier drape, hand hygiene, sterile gown, sterile gloves, mask, hair covering, sterile ultrasound probe cover (if used).  Procedure Description Area of catheter insertion was cleaned with chlorhexidine and draped in sterile fashion.  With real-time ultrasound guidance a central venous catheter was placed into the right subclavian vein. Nonpulsatile blood flow and easy flushing noted in all ports.  The catheter was sutured in place and sterile dressing applied.  Complications/Tolerance None; patient tolerated the procedure well. Chest X-ray is ordered to verify placement for internal jugular or subclavian cannulation.   Chest x-ray is not ordered for femoral cannulation.  EBL Minimal  Specimen(s) None

## 2023-07-03 NOTE — Progress Notes (Addendum)
Advanced Heart Failure Rounding Note  PCP-Cardiologist: Reatha Harps, MD   Subjective:   8/15: Elective triple vale surgery + MAZE. Underwent aortic valve replacement + mitral and tricuspid valve repairs by Dr. Milinda Antis 8/22: AHF consulted for high-output HF with persistent post-op shock  CRRT stopped 8/25. CVP 17/18. SBP 120s. Vaso now off. Epi at 13.   More alert today.   Objective:   Echo 8/22 EF 60-65%, no RWMA, small pericardial effusion present, MV repaired/replaced, trivial MR. TR repaired/replaced, mild TR. AV repaired/replaced.   Weight Range: 86.5 kg Body mass index is 23.84 kg/m.   Vital Signs:   Temp:  [97.6 F (36.4 C)-98.4 F (36.9 C)] 98.4 F (36.9 C) (08/26 0330) Pulse Rate:  [80-83] 81 (08/26 0045) Resp:  [13-33] 13 (08/26 0045) SpO2:  [92 %-100 %] 96 % (08/26 0045) Arterial Line BP: (79-139)/(28-67) 123/38 (08/26 0045) Weight:  [86.5 kg] 86.5 kg (08/26 0500) Last BM Date : 07/03/23  Weight change: Filed Weights   07/01/23 0426 07/02/23 0500 07/03/23 0500  Weight: 87.4 kg 87.7 kg 86.5 kg   Intake/Output:   Intake/Output Summary (Last 24 hours) at 07/03/2023 0739 Last data filed at 07/03/2023 0100 Gross per 24 hour  Intake 1993.39 ml  Output 20 ml  Net 1973.39 ml   CVP 17/18 Physical Exam  General:  well appearing.  No respiratory difficulty HEENT: normal. +cortrak Neck: supple. JVD elevated. Carotids 2+ bilat; no bruits. No lymphadenopathy or thyromegaly appreciated. Trialysis LIJ Cor: PMI nondisplaced. Regular rate & rhythm. No rubs, gallops or murmurs. Lungs: clear, diminished bases.  Abdomen: soft, nontender, nondistended. No hepatosplenomegaly. No bruits or masses. Good bowel sounds. Extremities: no cyanosis, clubbing, rash, edema. L wrist a line. RUE AVF +bruit/thrill Neuro: alert & oriented x 3, cranial nerves grossly intact. moves all 4 extremities w/o difficulty.    Telemetry   Paced [phone] (Personally reviewed)    EKG    No  new EKG to review  Labs    CBC Recent Labs    07/02/23 0255 07/03/23 0417  WBC 6.0 11.5*  HGB 8.1* 8.5*  HCT 26.5* 28.0*  MCV 107.7* 105.7*  PLT 85* 146*   Basic Metabolic Panel Recent Labs    22/02/54 0255 07/03/23 0417  NA 133* 135  K 4.2 5.1  CL 100 101  CO2 24 25  GLUCOSE 151* 175*  BUN 27* 56*  CREATININE 2.25* 4.21*  CALCIUM 9.9 10.8*  MG 2.5* 2.9*  PHOS 2.3* 4.5   Liver Function Tests Recent Labs    07/02/23 0255 07/03/23 0417  AST 51* 48*  ALT 10 12  ALKPHOS 96 98  BILITOT 1.2 1.2  PROT 7.9 8.1  ALBUMIN 2.6*  2.5* 2.5*  2.5*   No results for input(s): "LIPASE", "AMYLASE" in the last 72 hours. Cardiac Enzymes No results for input(s): "CKTOTAL", "CKMB", "CKMBINDEX", "TROPONINI" in the last 72 hours.  BNP: BNP (last 3 results) No results for input(s): "BNP" in the last 8760 hours.  ProBNP (last 3 results) No results for input(s): "PROBNP" in the last 8760 hours.   D-Dimer No results for input(s): "DDIMER" in the last 72 hours. Hemoglobin A1C No results for input(s): "HGBA1C" in the last 72 hours. Fasting Lipid Panel No results for input(s): "CHOL", "HDL", "LDLCALC", "TRIG", "CHOLHDL", "LDLDIRECT" in the last 72 hours. Thyroid Function Tests No results for input(s): "TSH", "T4TOTAL", "T3FREE", "THYROIDAB" in the last 72 hours.  Invalid input(s): "FREET3"  Other results:   Imaging  No results found.  Medications:     Scheduled Medications:  acyclovir  200 mg Per Tube BID   atorvastatin  10 mg Per Tube QHS   bisacodyl  10 mg Oral Daily   Or   bisacodyl  10 mg Rectal Daily   Chlorhexidine Gluconate Cloth  6 each Topical Daily   colchicine  0.3 mg Oral Daily   [START ON 07/04/2023] darbepoetin (ARANESP) injection - DIALYSIS  100 mcg Subcutaneous Q Tue-1800   docusate  100 mg Per Tube BID   escitalopram  10 mg Oral QHS   feeding supplement (PROSource TF20)  60 mL Per Tube BID   guaiFENesin  15 mL Oral BID   heparin injection  (subcutaneous)  5,000 Units Subcutaneous Q8H   insulin aspart  0-24 Units Subcutaneous Q4H   lidocaine-prilocaine  1 Application Topical Q M,W,F   midodrine  10 mg Per Tube TID   multivitamin  1 tablet Per Tube QHS   mouth rinse  15 mL Mouth Rinse 4 times per day   pantoprazole (PROTONIX) IV  40 mg Intravenous Q12H   polyethylene glycol  17 g Per Tube BID   QUEtiapine  25 mg Per Tube BID   sodium chloride flush  3 mL Intravenous Q12H   vancomycin variable dose per unstable renal function (pharmacist dosing)   Does not apply See admin instructions    Infusions:  sodium chloride Stopped (06/30/23 1935)   albumin human Stopped (06/23/23 0040)   epinephrine 15 mcg/min (07/03/23 0429)   feeding supplement (NEPRO CARB STEADY) 1,000 mL (07/03/23 0658)   lactated ringers 10 mL/hr at 07/03/23 0100   piperacillin-tazobactam (ZOSYN)  IV 2.25 g (07/03/23 0650)   vasopressin Stopped (07/02/23 1536)    PRN Medications: sodium chloride, albumin human, clonazePAM, ondansetron (ZOFRAN) IV, mouth rinse, oxyCODONE, phenol, sodium chloride flush, traMADol  Patient Profile   52 y/o AAM w/ mildly reduced systolic heart failure (EF once 45-50% in 2013 but normal on subsequent echos) valvular heart disease including severe AS, 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 EF 55-60% w/ mild RV dysfunction by echo in 6/24.    Admitted for elective triple vale surgery + MAZE. S/p bioprosthetic aortic valve replacement + mitral and tricuspid valve repairs. Post-op course c/b persistent vasoplagic syndrome.   Assessment/Plan  1. Post-op Vasopalegia  - POD #10, remains dependent on EPi support, currently on 13 mcg. VP turned off yesterday morning. Wean Epi as tolerated. Co-ox 88% - No overt signs of infection/ possible sepsis other than co-ox being high. Afebrile. WBC did  jump from 6>11.5K today.  - PCT and Lactic Acid stable. Will d/w CCM initiation of empiric abx   - Echo 8/22 EF 60-65%, no RWMA, small pericardial effusion present, MV repaired/replaced, trivial MR. TR repaired/replaced, mild TR. AV repaired/replaced.  - Suspected high output HF 2/2 AV fistula  - Continue to monitor, may need RHC   2.  Acute on Chronic Systolic Heart Failure w/ Prominent RV Dysfunction  - LVEF once 45-50% in 2013 but normal on subsequent echos. Pre-op TTE 6/25 EF 55-60%, GIIIDD (Restrictive) RV mildly reduced, mod PH w/ estimated RVSP 48 mmHg  - Intra-op TEE (while on 4 mcg of Epi) LVEF 45%, RV mod-severely reduced, stable AV prosthesis w/ normal gradient and stable valvuloplasty rings w/ mod residual MS, mean gradient 7 mmgH.   -  CVP 17/18. CRRT stopped 8/25. Plan for iHD vs CRRT tomorrow per nephrology - Echo as in #1 - may need RHC/ Swan placement as outlined above  - Continue midodrine 10 mg TID - once recovered, consider amyloid w/u    3. Valvular Heart Disease  - pre-op diagnosis, severe AS, severe MR 2/2 MVP, severe TR - s/p bioprosthetic AVR, mitral and tricuspid valve repairs - stable gradients on intra-op TEE post repairs - stable on echo 8/22   4. Post-op CHB  - Has Epicardial pacing wires, remains pacer dependent - EP following. Will need PPM once recovered     5. ESRD on HD  - s/p failed kidney transplants in 1999 and again in 2015 - Previously on CRRT, now stopped 8/25. Per nephrology plan to start iHD tomorrow if hemodynamics permit, otherwise will need CRRT again   6. H/o Atrial Fibrillation  - S/p MAZE    7. Thrombocytopenia  - plts 146 K  - likely 2/2 acute illness  - no gross bleeding, Hgb 8.5 today   8. ID: ?sepsis component to presentation - On Zosyn per CCM.   Length of Stay: [phone], NP  07/03/2023, 7:39 AM  Advanced Heart Failure Team Pager [phone] (M-F; 7a - 5p)  Please contact [facility] for night-coverage after  hours (5p -7a ) and weekends on [email or web address]  Patient seen with NP, agree with the above note.   He is on epinephrine 13 with SBP in 130s.  Also midodrine 10 tid.  He is off CVVH, CVP 22-23 this morning.   He is A-V sequentially paced.   Most recent echo (8/22) with EF 60-65%, s/p MV and TV repairs and bioprosthetic aortic valve replacement.   General: NAD Neck: Lines IJs, no thyromegaly or thyroid nodule.  Lungs: Clear to auscultation bilaterally with normal respiratory effort. CV: Nondisplaced PMI.  Heart regular S1/S2, no S3/S4, 2/6 early SEM RUSB.  Trace ankle edema.  Abdomen: Soft, nontender, no hepatosplenomegaly, no distention.  Skin: Intact without lesions or rashes.  Neurologic: Alert and oriented x 3.  Psych: Normal affect. Extremities: No clubbing or cyanosis.  HEENT: Normal.   Post-op vasoplegia/RV dysfunction.  BP excellent this morning but still on high dose epinephrine.  Discussed with nurse slowly tapering down epinephrine today, continue midodrine 10 tid.   CVP significantly elevated at 22-23 this morning.  He will need fluid removed, think CVVH would be best option.  Would recommend restarting, will discuss with renal.   Continue Zosyn for possible contribution of septic shock.  Procalcitonin today.   CRITICAL CARE Performed by: Marca Ancona  Total critical care time: 40 minutes  Critical care time was exclusive of separately billable procedures and treating other patients.  Critical care was necessary to treat or prevent imminent or life-threatening deterioration.  Critical care was time spent personally by me on the following activities: development of treatment plan with patient and/or surrogate as well as nursing, discussions with consultants, evaluation of patient's response to treatment, examination of patient, obtaining history from patient or surrogate, ordering and performing treatments and interventions, ordering and review of laboratory studies, ordering  and review of radiographic studies, pulse oximetry and re-evaluation of patient's condition.  Marca Ancona 07/03/2023 9:48 AM]

## 2023-07-03 NOTE — Progress Notes (Signed)
Physical Therapy Treatment Patient Details Name: [name] MRN: [id number] DOB: 01-31-1971 Today's Date: 07/03/2023   History of Present Illness Pt is a 52 y.o. male admitted 06/22/23 for same day planned tricuspid valve repair, mitral valve repair, aortic valve replacement, maze, TEE. Post-op course complicated by CHB, shock, AKI, respiratory failure. CRRT initiated 8/16. ETT 8/15-8/17. PMH includes CHF, HTN, NICM, polyneuropathy, ESRD on HD (s/p renal transplant 1999, 2015), bipolar, ADHD, anxiety, depression.    PT Comments  Pt remains lethargic with inability to stay engaged with therapist or keep eyes open. Pt transferred to EOB however with constant posterior lean, pt to correct to cues however unable to maintain. Pt unable to stand this date. RN getting ready to start CRRT again today. Acute PT to cont to follow.    If plan is discharge home, recommend the following: A lot of help with walking and/or transfers;A lot of help with bathing/dressing/bathroom;Assistance with cooking/housework;Assist for transportation;Help with stairs or ramp for entrance   Can travel by private vehicle        Equipment Recommendations  Other (comment) (TBA)    Recommendations for Other Services       Precautions / Restrictions Precautions Precautions: Fall Precaution Booklet Issued: No Precaution Comments: CRRT- restarted 8/26, external pacer Restrictions Weight Bearing Restrictions: Yes RUE Weight Bearing: Non weight bearing LUE Weight Bearing: Non weight bearing Other Position/Activity Restrictions: sternal prec     Mobility  Bed Mobility Overal bed mobility: Needs Assistance Bed Mobility: Supine to Sit, Sit to Supine     Supine to sit: +2 for safety/equipment, HOB elevated, Max assist Sit to supine: +2 for physical assistance, +2 for safety/equipment, Max assist   General bed mobility comments: max A for trunk elevation and returning BLE's into bed, pt requiring max verbal and tactile  cues to initiate LE movement to EOB, unable to hold heart pillow over chest due to lethargy    Transfers Overall transfer level: Needs assistance Equipment used: None (face to face transfer with bed pad) Transfers: Sit to/from Stand Sit to Stand: Max assist, +2 physical assistance           General transfer comment: completed 3, 1/2 stands to scoot up to [facility] along EOB.    Ambulation/Gait               General Gait Details: unable at this time   Stairs             Wheelchair Mobility     Tilt Bed    Modified Rankin (Stroke Patients Only)       Balance Overall balance assessment: Needs assistance Sitting-balance support: No upper extremity supported, Feet supported Sitting balance-Leahy Scale: Poor Sitting balance - Comments: pt with posterior lean requiring max verbal and tactile cues to keep anterior weight shift   Standing balance support: No upper extremity supported, During functional activity Standing balance-Leahy Scale: Poor Standing balance comment: unable                            Cognition Arousal: Lethargic Behavior During Therapy: Flat affect Overall Cognitive Status: Impaired/Different from baseline                                 General Comments: needs increased time, repeition to follow commands, eyes not opening much fully during session, difficulty staying awake, limited verbal communication  Exercises      General Comments General comments (skin integrity, edema, etc.): CRRT re-started today, pt actively bleeding from new R subclavian line, RN aware and present      Pertinent Vitals/Pain Pain Assessment Pain Assessment: Faces Faces Pain Scale: Hurts little more Pain Location: R knee pain Pain Descriptors / Indicators: Discomfort, Grimacing    Home Living                          Prior Function            PT Goals (current goals can now be found in the care plan section)  Acute Rehab PT Goals PT Goal Formulation: With patient Time For Goal Achievement: 07/09/23 Potential to Achieve Goals: Good Progress towards PT goals: Not progressing toward goals - comment    Frequency    Min 1X/week      PT Plan      Co-evaluation              AM-PAC PT "6 Clicks" Mobility   Outcome Measure  Help needed turning from your back to your side while in a flat bed without using bedrails?: Total Help needed moving from lying on your back to sitting on the side of a flat bed without using bedrails?: Total Help needed moving to and from a bed to a chair (including a wheelchair)?: Total Help needed standing up from a chair using your arms (e.g., wheelchair or bedside chair)?: Total Help needed to walk in hospital room?: Total Help needed climbing 3-5 steps with a railing? : Total 6 Click Score: 6    End of Session Equipment Utilized During Treatment: Oxygen Activity Tolerance: Patient limited by fatigue Patient left: in bed;with call bell/phone within reach;with nursing/sitter in room Nurse Communication: Mobility status PT Visit Diagnosis: Other abnormalities of gait and mobility (R26.89);Muscle weakness (generalized) (M62.81)     Time: 5784-6962 PT Time Calculation (min) (ACUTE ONLY): 16 min  Charges:    $Therapeutic Activity: 8-22 mins PT General Charges $$ ACUTE PT VISIT: 1 Visit                     [name], PT, DPT Acute Rehabilitation Services Secure chat preferred Office #: [phone]    [name] 07/03/2023, 12:05 PM

## 2023-07-03 NOTE — Plan of Care (Signed)
  Problem: Education: Goal: Knowledge of General Education information will improve Description: Including pain rating scale, medication(s)/side effects and non-pharmacologic comfort measures Outcome: Progressing   Problem: Health Behavior/Discharge Planning: Goal: Ability to manage health-related needs will improve Outcome: Progressing   Problem: Clinical Measurements: Goal: Ability to maintain clinical measurements within normal limits will improve Outcome: Progressing Goal: Will remain free from infection Outcome: Progressing Goal: Diagnostic test results will improve Outcome: Progressing Goal: Respiratory complications will improve Outcome: Progressing Goal: Cardiovascular complication will be avoided Outcome: Progressing   Problem: Activity: Goal: Risk for activity intolerance will decrease Outcome: Progressing   Problem: Nutrition: Goal: Adequate nutrition will be maintained Outcome: Progressing   Problem: Coping: Goal: Level of anxiety will decrease Outcome: Progressing   Problem: Elimination: Goal: Will not experience complications related to bowel motility Outcome: Progressing Goal: Will not experience complications related to urinary retention Outcome: Not Applicable Note: Patient is anuric    Problem: Pain Managment: Goal: General experience of comfort will improve Outcome: Progressing   Problem: Safety: Goal: Ability to remain free from injury will improve Outcome: Progressing   Problem: Skin Integrity: Goal: Risk for impaired skin integrity will decrease Outcome: Progressing   Problem: Education: Goal: Will demonstrate proper wound care and an understanding of methods to prevent future damage Outcome: Progressing Goal: Knowledge of disease or condition will improve Outcome: Progressing Goal: Knowledge of the prescribed therapeutic regimen will improve Outcome: Progressing Goal: Individualized Educational Video(s) Outcome: Progressing    Problem: Activity: Goal: Risk for activity intolerance will decrease Outcome: Progressing   Problem: Cardiac: Goal: Will achieve and/or maintain hemodynamic stability Outcome: Not Progressing Note: Still requiring high dose epinephrine    Problem: Clinical Measurements: Goal: Postoperative complications will be avoided or minimized Outcome: Progressing   Problem: Respiratory: Goal: Respiratory status will improve Outcome: Progressing   Problem: Skin Integrity: Goal: Wound healing without signs and symptoms of infection Outcome: Progressing Goal: Risk for impaired skin integrity will decrease Outcome: Progressing   Problem: Urinary Elimination: Goal: Ability to achieve and maintain adequate renal perfusion and functioning will improve Outcome: Progressing

## 2023-07-04 ENCOUNTER — Inpatient Hospital Stay (HOSPITAL_COMMUNITY): Payer: Medicare HMO

## 2023-07-04 ENCOUNTER — Inpatient Hospital Stay (HOSPITAL_COMMUNITY)
Admission: RE | Disposition: E | Payer: Self-pay | Source: Home / Self Care | Attending: Thoracic Surgery (Cardiothoracic Vascular Surgery)

## 2023-07-04 DIAGNOSIS — I5023 Acute on chronic systolic (congestive) heart failure: Secondary | ICD-10-CM

## 2023-07-04 DIAGNOSIS — Z9889 Other specified postprocedural states: Secondary | ICD-10-CM | POA: Diagnosis not present

## 2023-07-04 DIAGNOSIS — I442 Atrioventricular block, complete: Secondary | ICD-10-CM | POA: Diagnosis not present

## 2023-07-04 DIAGNOSIS — R570 Cardiogenic shock: Secondary | ICD-10-CM | POA: Diagnosis not present

## 2023-07-04 DIAGNOSIS — A419 Sepsis, unspecified organism: Secondary | ICD-10-CM

## 2023-07-04 DIAGNOSIS — R6521 Severe sepsis with septic shock: Secondary | ICD-10-CM | POA: Diagnosis not present

## 2023-07-04 HISTORY — PX: TEMPORARY PACEMAKER: CATH118268

## 2023-07-04 LAB — HEPATIC FUNCTION PANEL
ALT: 13 U/L (ref 0–44)
AST: 49 U/L — ABNORMAL HIGH (ref 15–41)
Albumin: 2.4 g/dL — ABNORMAL LOW (ref 3.5–5.0)
Alkaline Phosphatase: 89 U/L (ref 38–126)
Bilirubin, Direct: 0.3 mg/dL — ABNORMAL HIGH (ref 0.0–0.2)
Indirect Bilirubin: 0.5 mg/dL (ref 0.3–0.9)
Total Bilirubin: 0.8 mg/dL (ref 0.3–1.2)
Total Protein: 7.7 g/dL (ref 6.5–8.1)

## 2023-07-04 LAB — ECHOCARDIOGRAM COMPLETE
AR max vel: 2.23 cm2
AV Area VTI: 2.49 cm2
AV Area mean vel: 2.22 cm2
AV Mean grad: 18 mmHg
AV Peak grad: 36.7 mmHg
Ao pk vel: 3.03 m/s
Area-P 1/2: 1.73 cm2
Height: 75 in
MV VTI: 1.13 cm2
S' Lateral: 3.4 cm
Single Plane A4C EF: 60.1 %
Weight: 2977.09 oz

## 2023-07-04 LAB — BASIC METABOLIC PANEL
Anion gap: 12 (ref 5–15)
BUN: 55 mg/dL — ABNORMAL HIGH (ref 6–20)
CO2: 24 mmol/L (ref 22–32)
Calcium: 10.3 mg/dL (ref 8.9–10.3)
Chloride: 99 mmol/L (ref 98–111)
Creatinine, Ser: 3.99 mg/dL — ABNORMAL HIGH (ref 0.61–1.24)
GFR, Estimated: 17 mL/min — ABNORMAL LOW (ref >60)
Glucose, Bld: 172 mg/dL — ABNORMAL HIGH (ref 70–99)
Potassium: 4.6 mmol/L (ref 3.5–5.1)
Sodium: 135 mmol/L (ref 135–145)

## 2023-07-04 LAB — RENAL FUNCTION PANEL
Albumin: 2.3 g/dL — ABNORMAL LOW (ref 3.5–5.0)
Anion gap: 10 (ref 5–15)
BUN: 49 mg/dL — ABNORMAL HIGH (ref 6–20)
CO2: 24 mmol/L (ref 22–32)
Calcium: 10 mg/dL (ref 8.9–10.3)
Chloride: 100 mmol/L (ref 98–111)
Creatinine, Ser: 3.54 mg/dL — ABNORMAL HIGH (ref 0.61–1.24)
GFR, Estimated: 20 mL/min — ABNORMAL LOW (ref >60)
Glucose, Bld: 145 mg/dL — ABNORMAL HIGH (ref 70–99)
Phosphorus: 4.2 mg/dL (ref 2.5–4.6)
Potassium: 4.9 mmol/L (ref 3.5–5.1)
Sodium: 134 mmol/L — ABNORMAL LOW (ref 135–145)

## 2023-07-04 LAB — CBC
HCT: 27.2 % — ABNORMAL LOW (ref 39.0–52.0)
Hemoglobin: 8.1 g/dL — ABNORMAL LOW (ref 13.0–17.0)
MCH: 32.4 pg (ref 26.0–34.0)
MCHC: 29.8 g/dL — ABNORMAL LOW (ref 30.0–36.0)
MCV: 108.8 fL — ABNORMAL HIGH (ref 80.0–100.0)
Platelets: 169 10*3/uL (ref 150–400)
RBC: 2.5 MIL/uL — ABNORMAL LOW (ref 4.22–5.81)
RDW: 18.6 % — ABNORMAL HIGH (ref 11.5–15.5)
WBC: 13.2 10*3/uL — ABNORMAL HIGH (ref 4.0–10.5)
nRBC: 0.5 % — ABNORMAL HIGH (ref 0.0–0.2)

## 2023-07-04 LAB — GLUCOSE, CAPILLARY
Glucose-Capillary: 116 mg/dL — ABNORMAL HIGH (ref 70–99)
Glucose-Capillary: 118 mg/dL — ABNORMAL HIGH (ref 70–99)
Glucose-Capillary: 123 mg/dL — ABNORMAL HIGH (ref 70–99)
Glucose-Capillary: 123 mg/dL — ABNORMAL HIGH (ref 70–99)
Glucose-Capillary: 158 mg/dL — ABNORMAL HIGH (ref 70–99)
Glucose-Capillary: 167 mg/dL — ABNORMAL HIGH (ref 70–99)

## 2023-07-04 LAB — CERULOPLASMIN: Ceruloplasmin: 36.5 mg/dL — ABNORMAL HIGH (ref 16.0–31.0)

## 2023-07-04 LAB — COOXEMETRY PANEL
Carboxyhemoglobin: 1.9 % — ABNORMAL HIGH (ref 0.5–1.5)
Methemoglobin: 1.8 % — ABNORMAL HIGH (ref 0.0–1.5)
O2 Saturation: 79.8 %
Total hemoglobin: 8.8 g/dL — ABNORMAL LOW (ref 12.0–16.0)

## 2023-07-04 LAB — PHOSPHORUS: Phosphorus: 3.7 mg/dL (ref 2.5–4.6)

## 2023-07-04 LAB — MAGNESIUM: Magnesium: 2.6 mg/dL — ABNORMAL HIGH (ref 1.7–2.4)

## 2023-07-04 SURGERY — TEMPORARY PACEMAKER
Anesthesia: LOCAL

## 2023-07-04 MED ORDER — HEPARIN SODIUM (PORCINE) 1000 UNIT/ML IJ SOLN
INTRAMUSCULAR | Status: AC
  Filled 2023-07-04: qty 10

## 2023-07-04 MED ORDER — FENTANYL CITRATE (PF) 100 MCG/2ML IJ SOLN
INTRAMUSCULAR | Status: AC
  Filled 2023-07-04: qty 2

## 2023-07-04 MED ORDER — CLONAZEPAM 0.5 MG PO TABS
0.5000 mg | ORAL_TABLET | Freq: Two times a day (BID) | ORAL | Status: DC | PRN
  Administered 2023-07-04 (×2): 0.5 mg
  Filled 2023-07-04 (×2): qty 1

## 2023-07-04 MED ORDER — PRISMASOL BGK 4/2.5 32-4-2.5 MEQ/L EC SOLN: Status: DC

## 2023-07-04 MED ORDER — GUAIFENESIN 100 MG/5ML PO LIQD
15.0000 mL | Freq: Two times a day (BID) | ORAL | Status: DC
  Administered 2023-07-04 – 2023-07-30 (×53): 15 mL
  Filled 2023-07-04 (×53): qty 15

## 2023-07-04 MED ORDER — ESCITALOPRAM OXALATE 10 MG PO TABS
10.0000 mg | ORAL_TABLET | Freq: Every day | ORAL | Status: DC
  Administered 2023-07-04: 10 mg
  Filled 2023-07-04: qty 1

## 2023-07-04 MED ORDER — LIDOCAINE HCL (PF) 1 % IJ SOLN
INTRAMUSCULAR | Status: AC
  Filled 2023-07-04: qty 30

## 2023-07-04 MED ORDER — VASOPRESSIN 20 UNITS/100 ML INFUSION FOR SHOCK
0.0000 [IU]/min | INTRAVENOUS | Status: DC
  Administered 2023-07-04 – 2023-07-05 (×4): 0.03 [IU]/min via INTRAVENOUS
  Administered 2023-07-06 (×2): 0.04 [IU]/min via INTRAVENOUS
  Administered 2023-07-06: 0.03 [IU]/min via INTRAVENOUS
  Administered 2023-07-07 – 2023-07-12 (×18): 0.04 [IU]/min via INTRAVENOUS
  Administered 2023-07-13: 0.02 [IU]/min via INTRAVENOUS
  Administered 2023-07-13 – 2023-07-14 (×2): 0.04 [IU]/min via INTRAVENOUS
  Administered 2023-07-14 – 2023-07-15 (×2): 0.03 [IU]/min via INTRAVENOUS
  Administered 2023-07-15 – 2023-07-18 (×6): 0.04 [IU]/min via INTRAVENOUS
  Administered 2023-07-20: 0.02 [IU]/min via INTRAVENOUS
  Administered 2023-07-21 – 2023-07-22 (×2): 0.04 [IU]/min via INTRAVENOUS
  Administered 2023-07-22 – 2023-07-25 (×5): 0.02 [IU]/min via INTRAVENOUS
  Administered 2023-07-26: 0.03 [IU]/min via INTRAVENOUS
  Administered 2023-07-26: 0.02 [IU]/min via INTRAVENOUS
  Administered 2023-07-27: 0.03 [IU]/min via INTRAVENOUS
  Administered 2023-07-27: 0.02 [IU]/min via INTRAVENOUS
  Administered 2023-07-30: 0.03 [IU]/min via INTRAVENOUS
  Filled 2023-07-04 (×51): qty 100

## 2023-07-04 MED ORDER — VERAPAMIL HCL 2.5 MG/ML IV SOLN
INTRAVENOUS | Status: AC
  Filled 2023-07-04: qty 2

## 2023-07-04 MED ORDER — PERFLUTREN LIPID MICROSPHERE
1.0000 mL | INTRAVENOUS | Status: AC | PRN
  Administered 2023-07-04: 2 mL via INTRAVENOUS

## 2023-07-04 MED ORDER — MIDAZOLAM HCL 2 MG/2ML IJ SOLN
INTRAMUSCULAR | Status: AC
  Filled 2023-07-04: qty 2

## 2023-07-04 MED ORDER — LIDOCAINE HCL (PF) 1 % IJ SOLN
INTRAMUSCULAR | Status: DC | PRN
  Administered 2023-07-04: 5 mL via INTRADERMAL

## 2023-07-04 MED ORDER — COLCHICINE 0.3 MG HALF TABLET
0.3000 mg | ORAL_TABLET | ORAL | Status: DC
  Administered 2023-07-06 – 2023-07-16 (×3): 0.3 mg
  Filled 2023-07-04 (×3): qty 1

## 2023-07-04 MED ORDER — VASOPRESSIN 20 UNITS/100 ML INFUSION FOR SHOCK
0.0000 [IU]/min | INTRAVENOUS | Status: DC
  Filled 2023-07-04: qty 100

## 2023-07-04 MED ORDER — MIDODRINE HCL 5 MG PO TABS
15.0000 mg | ORAL_TABLET | Freq: Three times a day (TID) | ORAL | Status: DC
  Administered 2023-07-04 – 2023-07-11 (×22): 15 mg
  Filled 2023-07-04 (×22): qty 3

## 2023-07-04 SURGICAL SUPPLY — 8 items
CABLE ADAPT PACING TEMP 12FT (ADAPTER) IMPLANT
CATH-GARD ARROW CATH SHIELD (MISCELLANEOUS) ×1
GLIDESHEATH SLEND SS 6F .021 (SHEATH) IMPLANT
PACK CARDIAC CATHETERIZATION (CUSTOM PROCEDURE TRAY) IMPLANT
SHEATH PROBE COVER 6X72 (BAG) IMPLANT
SHIELD CATHGARD ARROW (MISCELLANEOUS) IMPLANT
WIRE MICRO SET SILHO 5FR 7 (SHEATH) IMPLANT
WIRE PACING TEMP ST TIP 5 (CATHETERS) IMPLANT

## 2023-07-04 NOTE — Progress Notes (Signed)
  Echocardiogram 2D Echocardiogram has been performed.  Milda Smart 07/04/2023, 4:02 PM

## 2023-07-04 NOTE — Progress Notes (Signed)
  Patient Name: [name] Date of Encounter: 07/06/2023   Primary Cardiologist: Reatha Harps, MD Electrophysiologist: Lanier Prude, MD  Interval Summary   Increased pressor support noted overnight Now on Vaso, 20 of Epi, and 2.5 of dobutamine.   Remains lethargic, but stirs to questioning intermittently.   Dependent down to 40 VVI, Threshold < 1.0  Vital Signs    Vitals:   07/06/23 0915 07/06/23 0930 07/06/23 0945 07/06/23 1000  BP:      Pulse: 83 82 82 85  Resp: 17 16 19  (!) 23  Temp:      TempSrc:      SpO2: 97% 98% 100% 99%  Weight:      Height:        Intake/Output Summary (Last 24 hours) at 07/06/2023 1027 Last data filed at 07/06/2023 1000 Gross per 24 hour  Intake 2492.33 ml  Output 3699.9 ml  Net -1207.57 ml   Filed Weights   07/04/23 0500 07/05/23 0500 07/06/23 0500  Weight: 84.4 kg 80.6 kg 82.2 kg    Physical Exam    GEN- The patient is acutely ill appearing, responds to voice.  Lungs- Diminished throughout.  Cardiac- Regular rate and rhythm GI- soft, NT, ND, + BS Extremities- no clubbing or cyanosis. No edema  Telemetry    V pacing at 30 (personally reviewed)  Hospital Course    52 y.o. male w/hx of Anxiety, depression, Bipolar disease, CHF, CKD s/p renal transplant 1999 -> redo 2015 -> ESRD/HD, and severe MR/severe TR/Moderate AS    Tricuspid Valve Repair, Mitral Valve Repair, Aortic Valve Replacement and MAZE 8/15    >> CHB pacing via epicardial wires Epicardial threshold increased 8/27 -> Temp wire placed RIJ  Assessment & Plan    CHB S/p AVR/TVr/MVr/MAZE/LAAC Epicardial wires with intermittent capture and worsening threshold Now s/p RIJ temp wire.  Dependent at 40 bpm today, Threshold <1.0.  ESRD Per primary team Now back on CVVHD, currently keeping even.   Sepsis of unclear etiology Shock With increased pressor support overnight On dobutamine, Epi, and vaso.   Pt remains critically ill with multi system organ  failure, and is not currently candidate for EP procedures.   For questions or updates, please contact [facility] Please consult [email or web address] for contact info under Cardiology/STEMI.  Signed, Graciella Freer, PA-C  07/06/2023, 10:27 AM

## 2023-07-04 NOTE — Progress Notes (Signed)
NAME:  [name], MRN:  [id number], DOB:  Jan 15, 1971, LOS: 12 ADMISSION DATE:  06/22/2023, CONSULTATION DATE: 06/22/2023 REFERRING MD: Leafy Ro - TCTS, CHIEF COMPLAINT: Postcardiac surgery  History of Present Illness:  This is a 52 year old gentleman, past medical history of anxiety depression, bipolar disease, congestive heart failure chronic diastolic heart failure kidney disease followed by renal transplant in July 12, 1998 repeat renal transplant in Jul 12, 2014.  Patient underwent surgery today for severe mitral regurgitation severe tricuspid regurgitation and moderate aortic stenosis.  He also has atrial fibrillation received maze procedure.  He had a complex mitral valve repair a tricuspid valve annuloplasty and aortic valve replacement followed by full right and left maze procedure.  Pertinent Medical History:   Past Medical History:  Diagnosis Date   ADHD (attention deficit hyperactivity disorder)    Anemia    Anemia, chronic disease 07/13/2011   Anxiety    Arthritis    Bipolar 2 disorder (HCC) 08/18/2011   Diagnosed in 2010-07-12.    Bipolar disorder (HCC)    Blood dyscrasia    Nephrotic Anemia   Blood transfusion    Blood transfusion without reported diagnosis    Chronic diastolic CHF (congestive heart failure) (HCC)    Complication of anesthesia    Woke up intubated gets combative  afraid to be alone    Congestive heart failure (CHF) (HCC) 07/13/2011   Onset July 12, 2005.  Followed by Dr. Eden Emms.  S/p cardiac catheterization in [facility] Dr. Anne Fu..  S/p cardiac catheterization in July 12, 2013 by Nishan.  Echo 07-12-2013.    Depression    Dysrhythmia    A. Fib 2023/07/13 with DCCV in May 2024   Erectile dysfunction    ESRD on hemodialysis (HCC) 07/13/2011   Glomerulonephritis at age 65, started HD in 1995/07/13.  Deceased donor renal transplant Jul 12, 1998 at [facility], then kidney failed and went back on HD in 07/12/2005.  L forearm AVF . s/p repeat renal transplant 10/2014 [facility].   GI bleed 11/01/2011   Rectal bleeding with  emesis/diarrhea    Heart murmur    Hemodialysis status (HCC)    Hydrocele, right 11/25/2014   Hypertension    Hypertensive emergency 01/06/2013   Hypoglycemia 01/11/2013   Influenza-like illness 12/18/2013   Mild ascending aorta dilatation (HCC)    NICM (nonischemic cardiomyopathy) (HCC)    Pericardial effusion    a. Mod by echo in July 12, 2013, similar to prior.   Peripheral polyneuropathy 08/04/2016   Pneumonia    Pulmonary edema 01/06/2013   Pulmonary hypertension associated with end stage renal disease on dialysis [facility]) 07/13/2011   Renal transplant, status post 11/25/2014   Pt with Glomerulonephritis at age 73. Deceased donor renal transplant July 12, 1998 right and left renal transplant 10/2014.  Followed by [facility] transplant team; nephrologist is Dr. Lonna Cobb.    Respiratory failure with hypoxia (HCC) 01/06/2013   Shortness of breath 12/12/2012   Swollen testicle 07/13/2011   URI (upper respiratory infection) 01/25/2012    Significant Hospital Events: Including procedures, antibiotic start and stop dates in addition to other pertinent events   8/15 - AVR, MVR, TVR with TCTS ([facility]). Remains on multiple pressors (NE, Epi, vaso). 8/16 - LIJ Trialysis catheter placed for CRRT.  8/17 extubated  8/18 - remains on CVVHD, 3 pressors  8/19-8/20 improving pressor needs 8/21 agitated delirium; cortrak 8/26 CRRT restarted  Interim History / Subjective:  CRRT restarted yesterday, tolerating fine. Intermittent LOC on pacing overnight. This AM in CHB under pacing. Epi at 16. Plan is for TVP placement today.  Objective:  Blood pressure (!) 84/18, pulse 61, temperature 98.6 F (37 C), temperature source Oral, resp. rate (!) 21, height 6\' 3"  (1.905 m), weight 84.4 kg, SpO2 98%. CVP:  [8 mmHg-21 mmHg] 14 mmHg      Intake/Output Summary (Last 24 hours) at 07/04/2023 0954 Last data filed at 07/04/2023 0900 Gross per 24 hour  Intake 3057.87 ml  Output 3789.4 ml  Net -731.53 ml   Filed  Weights   07/02/23 0500 07/03/23 0500 07/04/23 0500  Weight: 87.7 kg 86.5 kg 84.4 kg   Physical Examination: General: Adult male, resting in bed, in NAD. Neuro: Sleepy but awakens easily to voice, answers questions appropriately, follows basic commands. Intermittently confused/waxing and waning mental status but gradually improving. HEENT: [place]/AT. Sclerae anicteric. EOMI. Cardiovascular: AV pacing via epicardial wires. 3/6 SEM.  Lungs: Respirations even and unlabored.  CTA bilaterally, No W/R/R. Abdomen: BS x 4, soft, NT/ND.  Musculoskeletal: No gross deformities, no edema.  Skin: Intact, warm, no rashes.  Assessment & Plan:   Mitral valve regurgitation, tricuspid regurgitation and aortic stenosis status post valvular repair x 3 Postoperative junctional rhythm/CHB  - now pacer dependent, eventual need for PPM Postoperative distributive shock and vasoplegia and  component of RV dysfunction. AoC sCHF - intra-op TEE with EF 45% (on 4 epi), RV mod-severely reduced, Hx Afib s/p MAZE - TCTS managing. - Continue Epi, wean as able. - Added vasopressin today 8/27. - Continue Midodrine. - EP on board, planning for TVP placement today. - PT/OT.  Sepsis of unclear etiology. - Continue Zosyn. - Follow PCT.  Right pleural effusion - s/p pigtail chest tube with subsequent removal 8/25. - Supportive care. - Push IS.  Acute renal failure in the setting of hemodynamic insults on CKD. History of kidney disease status post renal transplant (failed 1999 and 2015). - Nephrology following. - Continue CRRT. - Follow BMP.  ABLA, postoperative as expected.  No active bleeding at present. Thrombocytopenia, ?chronic- stable. - Transfuse for Hgb < 7. - Monitor for bleeding.  Postoperative encephalopathy, ICU delirium- now seems to have cleared. Situational anxiety. - Continue Escitalopram, Seroquel at bedtime.  Moderate malnutrition. - TF's. - SLP following, now that he is a bit more alert he  might do better with swallowing etc.  Possible gout flare. - Continue Colchicine.   Best Practice (right click and "Reselect all SmartList Selections" daily)   Diet/type:TF. DVT prophylaxis:heparin ppx GI prophylaxis: PPI Lines: Central line Foley:  No  Code Status:  full code Last date of multidisciplinary goals of care discussion [Per Primary Team]   CC time: 30 min.   Rutherford Guys, PA - C [facility] For pager details, please see AMION or use Epic chat  After 1900, please call [facility] for cross coverage needs 07/04/2023, 9:54 AM

## 2023-07-04 NOTE — Progress Notes (Signed)
[facility] Progress Note  [name]:  Patient was restarted on CRRT on 8/26 per CHF team request for control of volume.  He had 2.7 liters UF over 8/26 with CRRT.  He has had a goal of net negative 50 to 100 mL/hr.  He has been on epi at 18 mcg/min.  Note that night RN [name] discovered that the patient got CRRT without replacement fluids much of yesterday - CRRT running as ordered overnight.  Filter has been changed for clotting and most recently doing better.  He just had a chest xray.  He states that he is normally on ESA outpatient.   Review of systems:   He reports shortness of breath is a little better Per nursing he is very weak  Hx nausea with emesis Post-op discomfort   Vitals:   07/04/23 0445 07/04/23 0500 07/04/23 0515 07/04/23 0530  BP:      Pulse: 69 76 66 77  Resp: (!) 25 (!) 24 (!) 23 (!) 29  Temp:      TempSrc:      SpO2: 97% 97% 98% 97%  Weight:  84.4 kg    Height:  6\' 3"  (1.905 m)      Physical Exam:    General adult male in bed in no acute distress HEENT normocephalic atraumatic extraocular movements intact sclera anicteric Neck supple trachea midline Lungs clear but reduced to auscultation bilaterally normal work of breathing at rest; on 2 liters nasal cannula   Heart S1S2 Abdomen soft nontender nondistended Extremities no edema  Psych normal mood and affect Neuro - alert and oriented x 3 he follows commands Access RUE AVF in place; left internal jugular nontunneled dialysis catheter      OP HD: [place] triad MWF nocturnal 3h    F250     94kg   2K/2.5 Ca bath   RUE AVF    Summary: [name] is a/an 52 y.o. male with a past medical history notable for ESRD on HD admitted with severe MR and TR and aortic stenosis s/p repair/replacement.    Assessment/ Plan  # Severe MR and TR and aortic stenosis - Status post Tri valve repair on 8/15.  Per cardiothoracic surgery  # Complete heart block - post-op, per EP/ cardiology   # ESRD - pt  was too hypotensive for hemodialysis post-op.  He is s/p CRRT 8/16 - 8/25.  Then was restarted on CRRT on 8/26 to optimize volume in the setting of continued pressor requirement  - Continue CRRT.  UF goal net negative 50 to 100 ml/hr as tolerated.  Please contact nephrology for changes to UF goal  - Transition to all 4K fluids   # Distributive Shock postoperatively - Pressors per primary team.  On midodrine.  Fluid volume overload is improved.  Weight 84.4 kg on 8/27 (from 95 kg on 8/15).    # Anemia of ESRD - transfuse as needed per primary team.  Concurrent thrombocytopenia improved.  On aranesp 100 mcg weekly on Tuesdays to start 8/27   # MBD ckd - hypercalcemia improved.  Note he was transitioned off of nepro after starting back on CRRT.  Phos acceptable.  No binders    # Vascular access - RUE AVF functioning well but had been unable to use due to shock.  Temporary dialysis catheter placed for CRRT.    Disposition - in ICU on CRRT   Recent Labs  Lab 07/03/23 0417 07/03/23 1530 07/04/23 0408  HGB 8.5*  --  8.1*  ALBUMIN 2.5*  2.5* 2.7* 2.4*  CALCIUM 10.8* 11.0* 10.3  PHOS 4.5 5.0* 3.7  CREATININE 4.21* 4.65* 3.99*  K 5.1 5.3* 4.6   No results for input(s): "IRON", "TIBC", "FERRITIN" in the last 168 hours.  Inpatient medications:  acyclovir  200 mg Per Tube BID   atorvastatin  10 mg Per Tube QHS   Chlorhexidine Gluconate Cloth  6 each Topical Daily   [START ON 07/06/2023] colchicine  0.3 mg Oral Once every 5 days   darbepoetin (ARANESP) injection - DIALYSIS  100 mcg Subcutaneous Q Tue-1800   docusate  100 mg Per Tube BID   escitalopram  10 mg Oral QHS   feeding supplement (PROSource TF20)  60 mL Per Tube Daily   folic acid  1 mg Per Tube Daily   guaiFENesin  15 mL Oral BID   heparin injection (subcutaneous)  5,000 Units Subcutaneous Q8H   insulin aspart  0-24 Units Subcutaneous Q4H   lidocaine-prilocaine  1 Application Topical Q M,W,F   midodrine  10 mg Per Tube TID    multivitamin  1 tablet Per Tube QHS   mouth rinse  15 mL Mouth Rinse 4 times per day   pantoprazole (PROTONIX) IV  40 mg Intravenous Q12H   QUEtiapine  25 mg Per Tube QHS   sodium chloride flush  3 mL Intravenous Q12H   thiamine  100 mg Per Tube Daily     prismasol BGK 4/2.5 400 mL/hr at 07/03/23 2330    prismasol BGK 4/2.5 400 mL/hr at 07/03/23 2330   sodium chloride Stopped (07/04/23 0025)   albumin human Stopped (06/23/23 0040)   epinephrine 18 mcg/min (07/04/23 0500)   feeding supplement (PIVOT 1.5 CAL) 65 mL/hr at 07/04/23 0500   piperacillin-tazobactam 3.375 g (07/04/23 0537)   prismasol BGK 2/2.5 dialysis solution 1,500 mL/hr at 07/04/23 0300   sodium chloride, albumin human, bisacodyl **OR** bisacodyl, clonazePAM, heparin, ondansetron (ZOFRAN) IV, mouth rinse, oxyCODONE, phenol, polyethylene glycol, sodium chloride flush, traMADol     Estanislado Emms, MD 6:10 AM 07/04/2023

## 2023-07-04 NOTE — Interval H&P Note (Signed)
History and Physical Interval Note:  07/04/2023 8:13 AM  [name]  has presented today for surgery, with the diagnosis of heart block.  The various methods of treatment have been discussed with the patient and family. After consideration of risks, benefits and other options for treatment, the patient has consented to  Procedure(s): TEMPORARY PACEMAKER (N/A) as a surgical intervention.  The patient's history has been reviewed, patient examined, no change in status, stable for surgery.  I have reviewed the patient's chart and labs.  Questions were answered to the patient's satisfaction.     Orbie Pyo

## 2023-07-04 NOTE — Progress Notes (Addendum)
      [address]       [place] [address]             [phone]      12 Days Post-Op Procedure(s) (LRB): TRICUSPID VALVE REPAIR MC3 Ring size 30 (N/A) MITRAL VALVE REPAIR (MVR) utilizing Simulis Band size 30mm (N/A) AORTIC VALVE REPLACEMENT (AVR) inspiris valve size 27 (N/A) MAZE (N/A) TRANSESOPHAGEAL ECHOCARDIOGRAM (N/A)  Subjective:  Doing okay.  Seems a little more sleepy/confused today.    Objective: Vital signs in last 24 hours: Temp:  [97.9 F (36.6 C)-98.6 F (37 C)] 98.6 F (37 C) (08/27 0735) Pulse Rate:  [66-82] 81 (08/27 0700) Cardiac Rhythm: A-V Sequential paced (08/26 2000) Resp:  [16-32] 23 (08/27 0700) BP: (69-95)/(17-59) 84/18 (08/26 2300) SpO2:  [85 %-100 %] 98 % (08/27 0700) Arterial Line BP: (71-138)/(31-66) 117/49 (08/27 0700) Weight:  [84.4 kg] 84.4 kg (08/27 0500)  Hemodynamic parameters for last 24 hours: CVP:  [8 mmHg-21 mmHg] 12 mmHg  Intake/Output from previous day: 08/26 0701 - 08/27 0700 In: 3859.5 [I.V.:1472.2; NG/GT:2107.3; IV Piggyback:250] Out: 3298.4 [Stool:80]  General appearance: alert, cooperative, and no distress Heart: RRR, paced Lungs: diminished breath sounds bibasilar Abdomen: soft, non-tender; bowel sounds normal; no masses,  no organomegaly Extremities: edema trace Wound: clean and dry  Lab Results: Recent Labs    07/03/23 0417 07/04/23 0408  WBC 11.5* 13.2*  HGB 8.5* 8.1*  HCT 28.0* 27.2*  PLT 146* 169   BMET:  Recent Labs    07/03/23 1530 07/04/23 0408  NA 135 135  K 5.3* 4.6  CL 100 99  CO2 21* 24  GLUCOSE 124* 172*  BUN 67* 55*  CREATININE 4.65* 3.99*  CALCIUM 11.0* 10.3    PT/INR: No results for input(s): "LABPROT", "INR" in the last 72 hours. ABG    Component Value Date/Time   PHART 7.305 (L) 06/28/2023 0756   HCO3 26.3 06/28/2023 0756   TCO2 28 06/28/2023 0756   ACIDBASEDEF 1.0 06/28/2023 0756   O2SAT 79.8 07/04/2023 0408   CBG (last 3)  Recent Labs     07/03/23 2334 07/04/23 0407 07/04/23 0733  GLUCAP 128* 167* 158*    Assessment/Plan: S/P Procedure(s) (LRB): TRICUSPID VALVE REPAIR MC3 Ring size 30 (N/A) MITRAL VALVE REPAIR (MVR) utilizing Simulis Band size 30mm (N/A) AORTIC VALVE REPLACEMENT (AVR) inspiris valve size 27 (N/A) MAZE (N/A) TRANSESOPHAGEAL ECHOCARDIOGRAM (N/A)  CV- CHB, pacer is starting to not fully capture--- remains on Epi @ 16.. EP will plan temp perm today Pulm- no acute issues, continue IS Renal- ESRD, restarted on CRRT yesterday due to volume management.. per Nephrology GI- poor oral intake, hasn't passed swallow.. continue tube feedings with Cortrak in place ID- on broad spectrum coverage x 7 days, continue Deconditioning- severe continue PT/OT   Continue ICU care   LOS: 12 days    Lowella Dandy, PA-C 07/04/2023  Agree with above Back on CRRT On epi at 18 Remains in complete heart block, but now has an escape rhythm in the 50s Plans for temp perm pacer shortly Continue ICU care  Ksenia Kunz O Gissele Narducci

## 2023-07-04 NOTE — Progress Notes (Signed)
SLP Cancellation Note  Patient Details Name: [name] MRN: [id number] DOB: October 04, 1971   Cancelled treatment:        Attempted to see pt for PO trials.  Pt is NPO for procedure.  SLP will reattempt as schedule permits.  If pt is available this afternoon following procedure, please secure chat [facility] SLP group or call acute rehab department at [phone].   Kerrie Pleasure, MA, CCC-SLP Acute Rehabilitation Services Office: [phone] 07/04/2023, 9:57 AM

## 2023-07-04 NOTE — Progress Notes (Signed)
OT Cancellation Note  Patient Details Name: [name] MRN: [id number] DOB: 08/12/1971   Cancelled Treatment:    Reason Eval/Treat Not Completed: Patient at procedure or test/ unavailable (at cath lab, will follow up for OT tx as schedule permits)  Carver Fila, OTD, OTR/L SecureChat [facility] Acute [facility] [phone]   Dalphine Handing 07/04/2023, 10:47 AM

## 2023-07-04 NOTE — Progress Notes (Addendum)
Advanced Heart Failure Rounding Note  PCP-Cardiologist: Reatha Harps, MD   Subjective:   8/15: Elective triple vale surgery + MAZE. Underwent aortic valve replacement + mitral and tricuspid valve repairs by Dr. Milinda Antis 8/22: AHF consulted for high-output HF with persistent post-op shock 8/25: CRRT stopped 8/26: CRRT restarted (CVP 22/23)  CVP 13. SBP 110s. Stable off vaso. Epi at 16.   Per EP plan for TTVP today.   Resting in bed. Didn't get much sleep yesterday.   Objective:   Echo 8/22 EF 60-65%, no RWMA, small pericardial effusion present, MV repaired/replaced, trivial MR. TR repaired/replaced, mild TR. AV repaired/replaced.   Weight Range: 84.4 kg Body mass index is 23.26 kg/m.   Vital Signs:   Temp:  [97.9 F (36.6 C)-98.6 F (37 C)] 98.6 F (37 C) (08/27 0735) Pulse Rate:  [66-82] 81 (08/27 0700) Resp:  [16-32] 23 (08/27 0700) BP: (69-95)/(17-59) 84/18 (08/26 2300) SpO2:  [85 %-100 %] 98 % (08/27 0700) Arterial Line BP: (71-138)/(31-66) 117/49 (08/27 0700) Weight:  [84.4 kg] 84.4 kg (08/27 0500) Last BM Date : 07/03/23  Weight change: Filed Weights   07/02/23 0500 07/03/23 0500 07/04/23 0500  Weight: 87.7 kg 86.5 kg 84.4 kg   Intake/Output:   Intake/Output Summary (Last 24 hours) at 07/04/2023 0747 Last data filed at 07/04/2023 0700 Gross per 24 hour  Intake 3859.5 ml  Output 3298.4 ml  Net 561.1 ml   CVP 13 Physical Exam  General:  sleepy appearing.  No respiratory difficulty HEENT: normal. +cortrak Neck: supple. JVD ~12 cm. Carotids 2+ bilat; no bruits. No lymphadenopathy or thyromegaly appreciated. LIJ trialysis cath. [facility] CVC Cor: PMI nondisplaced. Regular rate & rhythm. No rubs, gallops or murmurs. Lungs: clear Abdomen: soft, nontender, nondistended. No hepatosplenomegaly. No bruits or masses. Good bowel sounds. Extremities: no cyanosis, clubbing, rash, edema. L brachial a line. RUE fistula +bruit +thrill Neuro: alert & oriented x 3, cranial  nerves grossly intact. moves all 4 extremities w/o difficulty. Affect pleasant.   Telemetry   Paced [phone] (Personally reviewed)    EKG    No new EKG to review  Labs    CBC Recent Labs    07/03/23 0417 07/04/23 0408  WBC 11.5* 13.2*  HGB 8.5* 8.1*  HCT 28.0* 27.2*  MCV 105.7* 108.8*  PLT 146* 169   Basic Metabolic Panel Recent Labs    28/41/32 0417 07/03/23 1530 07/04/23 0408  NA 135 135 135  K 5.1 5.3* 4.6  CL 101 100 99  CO2 25 21* 24  GLUCOSE 175* 124* 172*  BUN 56* 67* 55*  CREATININE 4.21* 4.65* 3.99*  CALCIUM 10.8* 11.0* 10.3  MG 2.9*  --  2.6*  PHOS 4.5 5.0* 3.7   Liver Function Tests Recent Labs    07/03/23 0417 07/03/23 1530 07/04/23 0408  AST 48*  --  49*  ALT 12  --  13  ALKPHOS 98  --  89  BILITOT 1.2  --  0.8  PROT 8.1  --  7.7  ALBUMIN 2.5*  2.5* 2.7* 2.4*   No results for input(s): "LIPASE", "AMYLASE" in the last 72 hours. Cardiac Enzymes No results for input(s): "CKTOTAL", "CKMB", "CKMBINDEX", "TROPONINI" in the last 72 hours.  BNP: BNP (last 3 results) No results for input(s): "BNP" in the last 8760 hours.  ProBNP (last 3 results) No results for input(s): "PROBNP" in the last 8760 hours.   D-Dimer No results for input(s): "DDIMER" in the last 72 hours. Hemoglobin A1C  No results for input(s): "HGBA1C" in the last 72 hours. Fasting Lipid Panel No results for input(s): "CHOL", "HDL", "LDLCALC", "TRIG", "CHOLHDL", "LDLDIRECT" in the last 72 hours. Thyroid Function Tests No results for input(s): "TSH", "T4TOTAL", "T3FREE", "THYROIDAB" in the last 72 hours.  Invalid input(s): "FREET3"  Other results:   Imaging   DG CHEST PORT 1 VIEW  Result Date: 07/03/2023 CLINICAL DATA:  Central line placement EXAM: PORTABLE CHEST 1 VIEW COMPARISON:  07/02/2023 FINDINGS: Cardiomegaly. Possible mild perihilar edema, equivocal. No definite pleural effusions. No pneumothorax. Enteric tube courses into the stomach. Left IJ venous catheter  terminates in the mid SVC. New right subclavian venous catheter terminates in the mid SVC, 4 cm above the cavoatrial junction. Prosthetic valve.  Median sternotomy. IMPRESSION: New right subclavian venous catheter terminates in the mid SVC, 4 cm above the cavoatrial junction. No pneumothorax. Otherwise unchanged. Electronically Signed   By: Charline Bills M.D.   On: 07/03/2023 11:22    Medications:     Scheduled Medications:  acyclovir  200 mg Per Tube BID   atorvastatin  10 mg Per Tube QHS   Chlorhexidine Gluconate Cloth  6 each Topical Daily   [START ON 07/06/2023] colchicine  0.3 mg Oral Once every 5 days   darbepoetin (ARANESP) injection - DIALYSIS  100 mcg Subcutaneous Q Tue-1800   docusate  100 mg Per Tube BID   escitalopram  10 mg Oral QHS   feeding supplement (PROSource TF20)  60 mL Per Tube Daily   folic acid  1 mg Per Tube Daily   guaiFENesin  15 mL Oral BID   heparin injection (subcutaneous)  5,000 Units Subcutaneous Q8H   insulin aspart  0-24 Units Subcutaneous Q4H   lidocaine-prilocaine  1 Application Topical Q M,W,F   midodrine  10 mg Per Tube TID   multivitamin  1 tablet Per Tube QHS   mouth rinse  15 mL Mouth Rinse 4 times per day   pantoprazole (PROTONIX) IV  40 mg Intravenous Q12H   QUEtiapine  25 mg Per Tube QHS   sodium chloride flush  3 mL Intravenous Q12H   thiamine  100 mg Per Tube Daily    Infusions:   prismasol BGK 4/2.5 400 mL/hr at 07/03/23 2330    prismasol BGK 4/2.5 400 mL/hr at 07/03/23 2330   sodium chloride Stopped (07/04/23 0641)   albumin human Stopped (06/23/23 0040)   epinephrine 18 mcg/min (07/04/23 0700)   feeding supplement (PIVOT 1.5 CAL) 65 mL/hr at 07/04/23 0700   piperacillin-tazobactam Stopped (07/04/23 0607)   prismasol BGK 4/2.5      PRN Medications: sodium chloride, albumin human, bisacodyl **OR** bisacodyl, clonazePAM, heparin, ondansetron (ZOFRAN) IV, mouth rinse, oxyCODONE, phenol, polyethylene glycol, sodium chloride flush,  traMADol  Patient Profile   52 y/o AAM w/ mildly reduced systolic heart failure (EF once 45-50% in 2013 but normal on subsequent echos) valvular heart disease including severe AS, 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 EF 55-60% w/ mild RV dysfunction by echo in 6/24.    Admitted for elective triple vale surgery + MAZE. S/p bioprosthetic aortic valve replacement + mitral and tricuspid valve repairs. Post-op course c/b persistent vasoplagic syndrome.   Assessment/Plan  1. Post-op Vasopalegia  - POD #11, remains dependent on EPi support, currently on 14 mcg. VP turned off 8/25. Wean Epi as tolerated. Co-ox 80% -  No overt signs of infection/ possible sepsis other than co-ox being high. Afebrile. WBC did recently jump from 6>11.5>13.2K today.  - PCT and Lactic Acid stable. Will d/w CCM initiation of empiric abx   - Echo 8/22 EF 60-65%, no RWMA, small pericardial effusion present, MV repaired/replaced, trivial MR. TR repaired/replaced, mild TR. AV repaired/replaced.  - Suspected high output HF 2/2 AV fistula  - Continue to monitor, may need RHC   2.  Acute on Chronic Systolic Heart Failure w/ Prominent RV Dysfunction  - LVEF once 45-50% in 2013 but normal on subsequent echos. Pre-op TTE 6/25 EF 55-60%, GIIIDD (Restrictive) RV mildly reduced, mod PH w/ estimated RVSP 48 mmHg  - Intra-op TEE (while on 4 mcg of Epi) LVEF 45%, RV mod-severely reduced, stable AV prosthesis w/ normal gradient and stable valvuloplasty rings w/ mod residual MS, mean gradient 7 mmgH.   - CVP 13. Back on CRRT pulling 50-100/hr.  - Echo as in #1 - may need RHC/ Swan placement as outlined above  - Continue midodrine 10 mg TID - once recovered, consider amyloid w/u    3. Valvular Heart Disease  - pre-op diagnosis, severe AS, severe MR 2/2 MVP, severe TR - s/p bioprosthetic AVR,  mitral and tricuspid valve repairs - stable gradients on intra-op TEE post repairs - stable on echo 8/22   4. Post-op CHB  - Has Epicardial pacing wires, remains pacer dependent - EP following. Will need PPM once recovered   - Plan for TTVP today   5. ESRD on HD  - s/p failed kidney transplants in 1999 and again in 2015 - CRRT restarted 8/26   6. H/o Atrial Fibrillation  - S/p MAZE    7. Thrombocytopenia  - plts 169 K  - likely 2/2 acute illness  - no gross bleeding, Hgb 8.1 today   8. ID: ?sepsis component to presentation - On Zosyn per CCM.   Length of Stay: 12  Alen Bleacher, NP  07/04/2023, 7:47 AM  Advanced Heart Failure Team Pager [phone] (M-F; 7a - 5p)  Please contact [facility] for night-coverage after hours (5p -7a ) and weekends on [email or web address]  Patient seen with NP, agree with the above note.   He is still on epinephrine 16 to maintain MAP.  CVP 17, back on CVVH (I/Os ran even over the last day).   He has complete heart block though rate is in 50s.  The epicardial pacing wires are not consistently capturing.   He remains on Zosyn for ?component of septic shock.   General: NAD Neck: JVP 16+, no thyromegaly or thyroid nodule.  Lungs: Clear to auscultation bilaterally with normal respiratory effort. CV: Nondisplaced PMI.  Heart regular S1/S2, no S3/S4, 2/6 SEM RUSB.  No peripheral edema.   Abdomen: Soft, nontender, no hepatosplenomegaly, no distention.  Skin: Intact without lesions or rashes.  Neurologic: Alert and oriented x 3.  Psych: Normal affect. Extremities: No clubbing or cyanosis.  HEENT: Normal.   Still pressor-dependent, on epinephrine 16.  Post-op vasoplegia/RV dysfunction, ?component of septic shock.   - I would like to re-look at a complete echo, will order for today.  - Start on vasopressin to see if this will allow [place] to cut back on epinephrine (suspect vasoplegia/sepsis is playing a role here as above).  - Increase midodrine to 15 mg tid.     Weight down after restarting CVVH.  Aim for UF net negative 50-100 cc/hr today (gentle with pressor requirement).  CVP is 17.  Continue Zosyn for possible contribution of septic shock.  Procalcitonin has been high but not sure how helpful with ESRD.   Has complete heart block with rate in 50s.  Epicardial leads only intermittently capturing.  He is going to need PPM eventually (leadless device) when hemodynamically stable.  Plan per EP for TTVP today.   CRITICAL CARE Performed by: Marca Ancona  Total critical care time: 40 minutes  Critical care time was exclusive of separately billable procedures and treating other patients.  Critical care was necessary to treat or prevent imminent or life-threatening deterioration.  Critical care was time spent personally by me on the following activities: development of treatment plan with patient and/or surrogate as well as nursing, discussions with consultants, evaluation of patient's response to treatment, examination of patient, obtaining history from patient or surrogate, ordering and performing treatments and interventions, ordering and review of laboratory studies, ordering and review of radiographic studies, pulse oximetry and re-evaluation of patient's condition.   Tyron Manetta 9:21 AM 9:21 AM

## 2023-07-04 NOTE — Progress Notes (Signed)
      [address]       [place] [address]             [phone]      S/p temp pacemaker placement  Sedated  BP (!) 91/28   Pulse (!) 53   Temp 98.6 F (37 C) (Oral)   Resp (!) 21   Ht 6\' 3"  (1.905 m)   Wt 84.4 kg   SpO2 99%   BMI 23.26 kg/m   Intake/Output Summary (Last 24 hours) at 07/04/2023 1731 Last data filed at 07/04/2023 1600 Gross per 24 hour  Intake 2763.92 ml  Output 4179.4 ml  Net -1415.48 ml   Hypotensive with HR in mid 40s- will VVI pace at 80  [place] C. Dorris Fetch, MD [facility] [phone]

## 2023-07-04 NOTE — Progress Notes (Signed)
  Patient Name: [name] Date of Encounter: 07/04/2023  Primary Cardiologist: Reatha Harps, MD Electrophysiologist: Lanier Prude, MD  Interval Summary   Remains lethargic.   Had intermittent LOC of PPM overnight. This am, in CHB in the 50s underlying.   Epicardial wires with intermittent capture at 6 ma. Remains on epi now at 16  Vital Signs    Vitals:   07/04/23 0630 07/04/23 0700 07/04/23 0735 07/04/23 0800  BP:      Pulse: 69 81  60  Resp: (!) 23 (!) 23  (!) 27  Temp:   98.6 F (37 C)   TempSrc:   Oral   SpO2: 95% 98%  97%  Weight:      Height:        Intake/Output Summary (Last 24 hours) at 07/04/2023 0812 Last data filed at 07/04/2023 0800 Gross per 24 hour  Intake 3139.92 ml  Output 3298.4 ml  Net -158.48 ml   Filed Weights   07/02/23 0500 07/03/23 0500 07/04/23 0500  Weight: 87.7 kg 86.5 kg 84.4 kg    Physical Exam    GEN- The patient is chronically ill appearing, alert to person and follows commands with several prompts Lungs- Diminished throughout.  Cardiac- Regular rate and rhythm, no murmurs, rubs or gallops GI- soft, NT, ND, + BS Extremities- no clubbing or cyanosis. No edema  Telemetry     CHB in 50s underlying, V paced in 80s with intermittent LOC (personally reviewed)  Hospital Course    52 y.o. male w/hx of Anxiety, depression, Bipolar disease, CHF, CKD s/p renal transplant 1999 -> redo 2015 -> ESRD/HD, and severe MR/severe TR/Moderate AS    Tricuspid Valve Repair, Mitral Valve Repair, Aortic Valve Replacement and MAZE 8/15    >> CHB pacing via epicardial wires  Assessment & Plan    CHB S/p AVR/TVr/MVr/MAZE/LAAC Epicardial wires with intermittent capture and worsening threshold overnight, now at intermittent at 6 ma compared to 2-2.5 ma yesterday.  Plan for RIJ temp wire this am.    ESRD Per primary team Attempted to transition to iHD but now back on CVVHD  He remains too critically ill for EP procedures, as permanent  pacing will require leadless with anaesthesia.   For questions or updates, please contact [facility] Please consult [email or web address] for contact info under Cardiology/STEMI.  Signed, Graciella Freer, PA-C  07/04/2023, 8:12 AM

## 2023-07-05 ENCOUNTER — Encounter (HOSPITAL_COMMUNITY): Payer: Self-pay | Admitting: Internal Medicine

## 2023-07-05 DIAGNOSIS — N186 End stage renal disease: Secondary | ICD-10-CM | POA: Diagnosis not present

## 2023-07-05 DIAGNOSIS — R570 Cardiogenic shock: Secondary | ICD-10-CM | POA: Diagnosis not present

## 2023-07-05 DIAGNOSIS — I442 Atrioventricular block, complete: Secondary | ICD-10-CM | POA: Diagnosis not present

## 2023-07-05 DIAGNOSIS — A419 Sepsis, unspecified organism: Secondary | ICD-10-CM | POA: Diagnosis not present

## 2023-07-05 DIAGNOSIS — I5023 Acute on chronic systolic (congestive) heart failure: Secondary | ICD-10-CM | POA: Diagnosis not present

## 2023-07-05 DIAGNOSIS — Z9889 Other specified postprocedural states: Secondary | ICD-10-CM | POA: Diagnosis not present

## 2023-07-05 DIAGNOSIS — R6521 Severe sepsis with septic shock: Secondary | ICD-10-CM | POA: Diagnosis not present

## 2023-07-05 LAB — GLUCOSE, CAPILLARY
Glucose-Capillary: 139 mg/dL — ABNORMAL HIGH (ref 70–99)
Glucose-Capillary: 153 mg/dL — ABNORMAL HIGH (ref 70–99)
Glucose-Capillary: 141 mg/dL — ABNORMAL HIGH (ref 70–99)
Glucose-Capillary: 129 mg/dL — ABNORMAL HIGH (ref 70–99)
Glucose-Capillary: 94 mg/dL (ref 70–99)

## 2023-07-05 LAB — RENAL FUNCTION PANEL
Albumin: 2.4 g/dL — ABNORMAL LOW (ref 3.5–5.0)
Albumin: 2.3 g/dL — ABNORMAL LOW (ref 3.5–5.0)
Anion gap: 5 (ref 5–15)
Anion gap: 9 (ref 5–15)
BUN: 36 mg/dL — ABNORMAL HIGH (ref 6–20)
BUN: 34 mg/dL — ABNORMAL HIGH (ref 6–20)
CO2: 27 mmol/L (ref 22–32)
CO2: 25 mmol/L (ref 22–32)
Calcium: 10.3 mg/dL (ref 8.9–10.3)
Calcium: 10.3 mg/dL (ref 8.9–10.3)
Chloride: 102 mmol/L (ref 98–111)
Chloride: 101 mmol/L (ref 98–111)
Creatinine, Ser: 2.89 mg/dL — ABNORMAL HIGH (ref 0.61–1.24)
Creatinine, Ser: 2.76 mg/dL — ABNORMAL HIGH (ref 0.61–1.24)
GFR, Estimated: 26 mL/min — ABNORMAL LOW (ref >60)
GFR, Estimated: 27 mL/min — ABNORMAL LOW (ref >60)
Glucose, Bld: 164 mg/dL — ABNORMAL HIGH (ref 70–99)
Glucose, Bld: 139 mg/dL — ABNORMAL HIGH (ref 70–99)
Phosphorus: 4.1 mg/dL (ref 2.5–4.6)
Phosphorus: 4.2 mg/dL (ref 2.5–4.6)
Potassium: 5 mmol/L (ref 3.5–5.1)
Potassium: 4.9 mmol/L (ref 3.5–5.1)
Sodium: 134 mmol/L — ABNORMAL LOW (ref 135–145)
Sodium: 135 mmol/L (ref 135–145)

## 2023-07-05 LAB — CBC
HCT: 26.1 % — ABNORMAL LOW (ref 39.0–52.0)
Hemoglobin: 7.7 g/dL — ABNORMAL LOW (ref 13.0–17.0)
MCH: 31.7 pg (ref 26.0–34.0)
MCHC: 29.5 g/dL — ABNORMAL LOW (ref 30.0–36.0)
MCV: 107.4 fL — ABNORMAL HIGH (ref 80.0–100.0)
Platelets: 150 10*3/uL (ref 150–400)
RBC: 2.43 MIL/uL — ABNORMAL LOW (ref 4.22–5.81)
RDW: 18.8 % — ABNORMAL HIGH (ref 11.5–15.5)
WBC: 11.4 10*3/uL — ABNORMAL HIGH (ref 4.0–10.5)
nRBC: 0.5 % — ABNORMAL HIGH (ref 0.0–0.2)

## 2023-07-05 LAB — VITAMIN B6: Vitamin B6: 21.6 ug/L (ref 3.4–65.2)

## 2023-07-05 LAB — HEPATIC FUNCTION PANEL
ALT: 13 U/L (ref 0–44)
AST: 46 U/L — ABNORMAL HIGH (ref 15–41)
Albumin: 2.4 g/dL — ABNORMAL LOW (ref 3.5–5.0)
Alkaline Phosphatase: 78 U/L (ref 38–126)
Bilirubin, Direct: 0.3 mg/dL — ABNORMAL HIGH (ref 0.0–0.2)
Indirect Bilirubin: 0.7 mg/dL (ref 0.3–0.9)
Total Bilirubin: 1 mg/dL (ref 0.3–1.2)
Total Protein: 8.1 g/dL (ref 6.5–8.1)

## 2023-07-05 LAB — COOXEMETRY PANEL
Carboxyhemoglobin: 1.1 % (ref 0.5–1.5)
Carboxyhemoglobin: 1.5 % (ref 0.5–1.5)
Methemoglobin: 3.2 % — ABNORMAL HIGH (ref 0.0–1.5)
Methemoglobin: 1.6 % — ABNORMAL HIGH (ref 0.0–1.5)
O2 Saturation: 89.7 %
O2 Saturation: 87.6 %
Total hemoglobin: 8.1 g/dL — ABNORMAL LOW (ref 12.0–16.0)
Total hemoglobin: 8.2 g/dL — ABNORMAL LOW (ref 12.0–16.0)

## 2023-07-05 LAB — AMMONIA: Ammonia: 39 umol/L — ABNORMAL HIGH (ref 9–35)

## 2023-07-05 LAB — MAGNESIUM: Magnesium: 2.8 mg/dL — ABNORMAL HIGH (ref 1.7–2.4)

## 2023-07-05 LAB — COPPER, SERUM: Copper: 131 ug/dL (ref 69–132)

## 2023-07-05 LAB — ZINC: Zinc: 95 ug/dL (ref 44–115)

## 2023-07-05 LAB — PROCALCITONIN: Procalcitonin: 5.78 ng/mL

## 2023-07-05 MED ORDER — ASPIRIN 81 MG PO CHEW
81.0000 mg | CHEWABLE_TABLET | Freq: Every day | ORAL | Status: DC
  Administered 2023-07-05 – 2023-07-30 (×26): 81 mg
  Filled 2023-07-05 (×26): qty 1

## 2023-07-05 MED ORDER — PRISMASOL BGK 0/2.5 32-2.5 MEQ/L EC SOLN
Status: DC
  Filled 2023-07-05 (×23): qty 5000

## 2023-07-05 MED ORDER — METOCLOPRAMIDE HCL 5 MG/ML IJ SOLN
5.0000 mg | Freq: Three times a day (TID) | INTRAMUSCULAR | Status: AC
  Administered 2023-07-05 – 2023-07-06 (×3): 5 mg via INTRAVENOUS
  Filled 2023-07-05 (×3): qty 2

## 2023-07-05 MED ORDER — SODIUM CHLORIDE 0.9 % IV SOLN: INTRAVENOUS | Status: DC | PRN

## 2023-07-05 MED ORDER — THIAMINE HCL 100 MG/ML IJ SOLN
100.0000 mg | Freq: Every day | INTRAMUSCULAR | Status: DC
  Administered 2023-07-05 – 2023-07-06 (×2): 100 mg via INTRAVENOUS
  Filled 2023-07-05 (×2): qty 2

## 2023-07-05 MED ORDER — DOBUTAMINE-DEXTROSE 4-5 MG/ML-% IV SOLN
2.5000 ug/kg/min | INTRAVENOUS | Status: DC
  Filled 2023-07-05: qty 250

## 2023-07-05 MED ORDER — DOBUTAMINE-DEXTROSE 4-5 MG/ML-% IV SOLN
2.5000 ug/kg/min | INTRAVENOUS | Status: DC
  Administered 2023-07-05: 2.5 ug/kg/min via INTRAVENOUS
  Administered 2023-07-07 – 2023-07-11 (×3): 5 ug/kg/min via INTRAVENOUS
  Filled 2023-07-05 (×4): qty 250

## 2023-07-05 MED ORDER — RENA-VITE PO TABS
1.0000 | ORAL_TABLET | Freq: Two times a day (BID) | ORAL | Status: DC
  Administered 2023-07-05 – 2023-07-30 (×49): 1
  Filled 2023-07-05 (×49): qty 1

## 2023-07-05 MED ORDER — FOLIC ACID 5 MG/ML IJ SOLN
1.0000 mg | Freq: Every day | INTRAMUSCULAR | Status: DC
  Administered 2023-07-05 – 2023-07-11 (×7): 1 mg via INTRAVENOUS
  Filled 2023-07-05 (×9): qty 0.2

## 2023-07-05 MED ORDER — ESCITALOPRAM OXALATE 10 MG PO TABS
10.0000 mg | ORAL_TABLET | Freq: Every day | ORAL | Status: DC
  Administered 2023-07-05 – 2023-07-30 (×26): 10 mg
  Filled 2023-07-05 (×26): qty 1

## 2023-07-05 NOTE — Progress Notes (Cosign Needed Addendum)
  Patient Name: [name] Date of Encounter: 07/05/2023   Primary Cardiologist: Reatha Harps, MD Electrophysiologist: Lanier Prude, MD  Interval Summary   S/p Temp wire yesterday with increasing epi wire threshold  Hypotensive overnight despite pressors, HR also decreased so temp wire turned up (VVI 40 -> VVI 80)  Lethargic this am. Dependent on pacer to 35 bpm.   Vital Signs    Vitals:   07/05/23 0745 07/05/23 0800 07/05/23 0815 07/05/23 0830  BP:      Pulse: 82 82 85 83  Resp: (!) 25 (!) 26 (!) 26 (!) 23  Temp:      TempSrc:      SpO2: 100% 100% 100% 100%  Weight:      Height:        Intake/Output Summary (Last 24 hours) at 07/05/2023 0839 Last data filed at 07/05/2023 0800 Gross per 24 hour  Intake 2575.85 ml  Output 4535.6 ml  Net -1959.75 ml   Filed Weights   07/03/23 0500 07/04/23 0500 07/05/23 0500  Weight: 86.5 kg 84.4 kg 80.6 kg    Physical Exam    GEN- The patient is ill appearing. Stirs to voice with difficulty and to noxious stimuli.  Lungs- Diminished  Cardiac- Regular rate and rhythm, no murmurs, rubs or gallops GI- soft, NT, ND, + BS Extremities- no clubbing or cyanosis. No edema  Telemetry    V pacing at 80 (personally reviewed)  Hospital Course    52 y.o. male w/hx of Anxiety, depression, Bipolar disease, CHF, CKD s/p renal transplant 1999 -> redo 2015 -> ESRD/HD, and severe MR/severe TR/Moderate AS    Tricuspid Valve Repair, Mitral Valve Repair, Aortic Valve Replacement and MAZE 8/15    >> CHB pacing via epicardial wires Epicardial threshold increased 8/27 -> Temp wire placed RIJ  Assessment & Plan    CHB S/p AVR/TVr/MVr/MAZE/LAAC Epicardial wires with intermittent capture and worsening threshold Now s/p RIJ temp wire.  Dependent at 35 bpm this am. Threshold < 1.0.   ESRD Per primary team Attempted to transition to iHD but now back on CVVHD  Sepsis of unclear etiology Shock Encephalopathy Remains on pressor support  with Epi and Vaso now added.  Add Ammonia to morning labs.   Pt remains critically ill; Not currently candidate for EP procedures.   For questions or updates, please contact [facility] Please consult [email or web address] for contact info under Cardiology/STEMI.  Signed, Graciella Freer, PA-C  07/05/2023, 8:39 AM

## 2023-07-05 NOTE — Progress Notes (Signed)
Speech Language Pathology Treatment: Dysphagia  Patient Details Name: [name] MRN: [id number] DOB: 04/04/1971 Today's Date: 07/05/2023 Time: 0920-0930 SLP Time Calculation (min) (ACUTE ONLY): 10 min  Assessment / Plan / Recommendation Clinical Impression  Pt arouses to voice, has pooled oral secretions. Can stay awake for 15-30 second intervals. Agreeable to ice chips, but needs frequent cues to sustain attention and initiate swallowing. After three ice chip pt with weak cough. Attempted a sip of water which pt could not orally manipulate without immediate cough prior to swallow. Cough weak and prolonged. He is at risk of aspiration with any PO. His arousal is not consistent enough and cough too weak to be protective. Pt too frail. Will f/u.   HPI HPI: This is a 52 year old gentleman, Patient underwent surgery 8/15  for severe mitral regurgitation severe tricuspid regurgitation and moderate aortic stenosis.  He also has atrial fibrillation received maze procedure.  He had a complex mitral valve repair a tricuspid valve annuloplasty and aortic valve replacement followed by full right and left maze procedure. Extubated 8/17. past medical history of tracheostomy in 2011 (reason unknown, pt reports "real bad pna"), anxiety depression, bipolar disease, congestive heart failure chronic diastolic heart failure kidney disease followed by renal transplant in 1999 repeat renal transplant in 2015.      SLP Plan  Continue with current plan of care      Recommendations for follow up therapy are one component of a multi-disciplinary discharge planning process, led by the attending physician.  Recommendations may be updated based on patient status, additional functional criteria and insurance authorization.    Recommendations  Diet recommendations: NPO                  Oral care BID     Dysphagia, unspecified (R13.10)     Continue with current plan of care     Jericka Kadar, Riley Nearing  07/05/2023, 11:27 AM

## 2023-07-05 NOTE — Progress Notes (Signed)
Patient ID: Darail Neubauer, male   DOB: 10-17-1971, 52 y.o.   MRN: [id number]  TCTS Evening Rounds:  Hemodynamically unchanged on vaso 0.03, epi 10, dobut 2.5 started today. Arterial line has been fluctuating a lot.   Co-ox 87.6 this afternoon.   BMET    Component Value Date/Time   NA 135 07/05/2023 1609   K 4.9 07/05/2023 1609   CL 101 07/05/2023 1609   CO2 25 07/05/2023 1609   GLUCOSE 139 (H) 07/05/2023 1609   BUN 34 (H) 07/05/2023 1609   CREATININE 2.76 (H) 07/05/2023 1609   CALCIUM 10.3 07/05/2023 1609   CALCIUM 9.8 02/18/2011 1007   GFRNONAA 27 (L) 07/05/2023 1609   Has been more alert this afternoon.

## 2023-07-05 NOTE — Progress Notes (Signed)
Inpatient Rehab Admissions Coordinator:   Per therapy recommendations,  patient was screened for CIR candidacy by Megan Salon, MS, CCC-SLP.   At this time, Pt. is not medically ready for CIR due to currently  requiring CRRT and poor tolerance with therapy . I will not pursue a rehab consult for this Pt. at this time, but CIR admissions team will follow and monitor for medical readiness and place consult order if Pt. appears to be an appropriate candidate. Please contact me with any questions.   Megan Salon, MS, CCC-SLP Rehab Admissions Coordinator  [phone] (celll) [phone] (office)

## 2023-07-05 NOTE — Progress Notes (Addendum)
Advanced Heart Failure Rounding Note  PCP-Cardiologist: Reatha Harps, MD   Subjective:   8/15: Elective triple vale surgery + MAZE. Underwent aortic valve replacement + mitral and tricuspid valve repairs by Dr. Milinda Antis 8/22: AHF consulted for high-output HF with persistent post-op shock 8/25: CRRT stopped 8/26: CRRT restarted (CVP 22/23)  CVP 18. SBP 100s. Epi at 11. Vaso restarted yesterday for persistent vaso plegia, now at 0.03. co-ox 90%  S/p temp wire yesterday.   echo 07/04/23: EF 70-75%, severe concentric LVH, RV mod reduced, RV severely enlarged. LA/RA mod dilated. Large pericardial effusion. No evidence of tamponade.   Drowsy today.   Objective:   Echo 8/22 EF 60-65%, no RWMA, small pericardial effusion present, MV repaired/replaced, trivial MR. TR repaired/replaced, mild TR. AV repaired/replaced.   Weight Range: 80.6 kg Body mass index is 22.21 kg/m.   Vital Signs:   Temp:  [96.8 F (36 C)-98.6 F (37 C)] 98.5 F (36.9 C) (08/28 0725) Pulse Rate:  [49-86] 83 (08/28 0830) Resp:  [17-34] 23 (08/28 0830) BP: (55-125)/(19-58) 91/28 (08/27 1123) SpO2:  [84 %-100 %] 100 % (08/28 0830) Arterial Line BP: (79-140)/(32-80) 103/36 (08/28 0830) Weight:  [80.6 kg] 80.6 kg (08/28 0500) Last BM Date : 07/04/23  Weight change: Filed Weights   07/03/23 0500 07/04/23 0500 07/05/23 0500  Weight: 86.5 kg 84.4 kg 80.6 kg   Intake/Output:   Intake/Output Summary (Last 24 hours) at 07/05/2023 0842 Last data filed at 07/05/2023 0800 Gross per 24 hour  Intake 2575.85 ml  Output 4535.6 ml  Net -1959.75 ml   CVP 18 Physical Exam  General:  sleepy appearing.  No respiratory difficulty HEENT: normal. +cortrak Neck: supple. JVD elevated. Carotids 2+ bilat; no bruits. No lymphadenopathy or thyromegaly appreciated. +LIJ trialysis cath. [facility] CVC Cor: PMI nondisplaced. Regular rate & rhythm. No rubs, gallops or murmurs. Lungs: clear Abdomen: soft, nontender, nondistended. No  hepatosplenomegaly. No bruits or masses. Good bowel sounds. Extremities: no cyanosis, clubbing, rash, edema. Fistula LUE +bruit +thrill. L brachial a line Neuro: drowsy   Telemetry   Paced 80s (Personally reviewed)    EKG    No new EKG to review  Labs    CBC Recent Labs    07/04/23 0408 07/05/23 0452  WBC 13.2* 11.4*  HGB 8.1* 7.7*  HCT 27.2* 26.1*  MCV 108.8* 107.4*  PLT 169 150   Basic Metabolic Panel Recent Labs    21/30/86 0408 07/04/23 1518 07/05/23 0452  NA 135 134* 134*  K 4.6 4.9 5.0  CL 99 100 102  CO2 24 24 27   GLUCOSE 172* 145* 164*  BUN 55* 49* 36*  CREATININE 3.99* 3.54* 2.89*  CALCIUM 10.3 10.0 10.3  MG 2.6*  --  2.8*  PHOS 3.7 4.2 4.1   Liver Function Tests Recent Labs    07/04/23 0408 07/04/23 1518 07/05/23 0452  AST 49*  --  46*  ALT 13  --  13  ALKPHOS 89  --  78  BILITOT 0.8  --  1.0  PROT 7.7  --  8.1  ALBUMIN 2.4* 2.3* 2.4*  2.4*   No results for input(s): "LIPASE", "AMYLASE" in the last 72 hours. Cardiac Enzymes No results for input(s): "CKTOTAL", "CKMB", "CKMBINDEX", "TROPONINI" in the last 72 hours.  BNP: BNP (last 3 results) No results for input(s): "BNP" in the last 8760 hours.  ProBNP (last 3 results) No results for input(s): "PROBNP" in the last 8760 hours.   D-Dimer No results for input(s): "  DDIMER" in the last 72 hours. Hemoglobin A1C No results for input(s): "HGBA1C" in the last 72 hours. Fasting Lipid Panel No results for input(s): "CHOL", "HDL", "LDLCALC", "TRIG", "CHOLHDL", "LDLDIRECT" in the last 72 hours. Thyroid Function Tests No results for input(s): "TSH", "T4TOTAL", "T3FREE", "THYROIDAB" in the last 72 hours.  Invalid input(s): "FREET3"  Other results:   Imaging   DG CHEST PORT 1 VIEW  Result Date: 07/04/2023 CLINICAL DATA:  Central line placement EXAM: PORTABLE CHEST 1 VIEW COMPARISON:  07/04/2023 at 0600 hours FINDINGS: Cardiomegaly with mild interstitial edema. No definite pleural  effusions. No pneumothorax. New right IJ transvenous pacemaker lead, in satisfactory position. Left IJ dual lumen dialysis catheter terminating in the mid SVC. Right subclavian venous catheter terminating in the mid SVC. Enteric tube coursing into the stomach. Prosthetic valves.  Median sternotomy. IMPRESSION: New right IJ transvenous pacemaker lead, in satisfactory position. No pneumothorax. Cardiomegaly with mild interstitial edema. Electronically Signed   By: Charline Bills M.D.   On: 07/04/2023 16:22   ECHOCARDIOGRAM COMPLETE  Result Date: 07/04/2023    ECHOCARDIOGRAM REPORT   Patient Name:   [name] Date of Exam: 07/04/2023 Medical Rec #:  [id number]  Height:       75.0 in Accession #:    [id number] Weight:       186.1 lb Date of Birth:  1971/05/02  BSA:          2.128 m Patient Age:    52 years   BP:           0/0 mmHg Patient Gender: M          HR:           47 bpm.47 Exam Location:  Inpatient Procedure: 2D Echo Indications:    Prior Valve disease, unclear blood pressure  History:        Patient has prior history of Echocardiogram examinations. Aortic                 Valve Disease and Mitral Valve Disease.  Sonographer:    Data processing manager Referring Phys: [phone] Mariette Cowley S Karlin Binion IMPRESSIONS  1. Left ventricular ejection fraction, by estimation, is 70 to 75%. The left ventricle has hyperdynamic function. The left ventricle has no regional wall motion abnormalities. There is severe concentric left ventricular hypertrophy. Left ventricular diastolic parameters are indeterminate.  2. Right ventricular systolic function is moderately reduced. The right ventricular size is severely enlarged. Mildly increased right ventricular wall thickness.  3. Left atrial size was moderately dilated.  4. Right atrial size was moderately dilated.  5. Large pericardial effusion. The pericardial effusion is lateral to the left ventricle and circumferential. There is no evidence of cardiac tamponade.  6. 2D MVA 3.16  cm2. 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 The mitral valve has been repaired/replaced. No evidence of mitral valve regurgitation.  7. Mean gradient 4.4 mm Hg at a heart rate of 54 bpm. The tricuspid valve is has been repaired/replaced.  8. The aortic valve has been repaired/replaced. Aortic valve regurgitation is not visualized. Aortic valve mean gradient measures 18.0 mmHg. Aortic valve acceleration time measures 86 msec. Comparison(s): Prior images reviewed side by side. Mitral mean gradients have improved at lower heart rates. Pericardial effusion size has increased. Aortic flows not well seen on prior study. FINDINGS  Left Ventricle: Left ventricular ejection fraction, by estimation, is 70 to 75%. The left ventricle has hyperdynamic function. The left ventricle has no regional wall motion abnormalities. The left ventricular  internal cavity size was normal in size. There is severe concentric left ventricular hypertrophy. Left ventricular diastolic parameters are indeterminate. Right Ventricle: The right ventricular size is severely enlarged. Mildly increased right ventricular wall thickness. Right ventricular systolic function is moderately reduced. Left Atrium: Left atrial size was moderately dilated. Right Atrium: Right atrial size was moderately dilated. Pericardium: A large pericardial effusion is present. The pericardial effusion is lateral to the left ventricle and circumferential. There is no evidence of cardiac tamponade. Mitral Valve: 2D MVA 3.16 cm2. 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 The mitral valve has been repaired/replaced. No evidence of mitral valve regurgitation. MV peak gradient, 18.0 mmHg. The mean mitral valve gradient is 6.0 mmHg. Tricuspid Valve: Mean gradient 4.4 mm Hg at a heart rate of 54 bpm. The tricuspid valve is has been repaired/replaced. Tricuspid valve regurgitation is mild. Aortic Valve: Normal stroke volume and DVI; Normal EOAi 1.17 cm2/m2. Increased flows in the setting of  dynamic function. The aortic valve has been repaired/replaced. Aortic valve regurgitation is not visualized. Aortic valve mean gradient measures 18.0 mmHg. Aortic valve peak gradient measures 36.7 mmHg. Aortic valve area, by VTI measures 2.49 cm. Pulmonic Valve: The pulmonic valve was normal in structure. Pulmonic valve regurgitation is mild. No evidence of pulmonic stenosis. Aorta: The aortic root and ascending aorta are structurally normal, with no evidence of dilitation. IAS/Shunts: The interatrial septum appears to be lipomatous. The interatrial septum was not well visualized.  LEFT VENTRICLE PLAX 2D LVIDd:         4.80 cm      Diastology LVIDs:         3.40 cm      LV e' medial:    3.92 cm/s LV PW:         1.30 cm      LV E/e' medial:  49.5 LV IVS:        1.10 cm      LV e' lateral:   4.90 cm/s LVOT diam:     2.10 cm      LV E/e' lateral: 39.6 LV SV:         100 LV SV Index:   47 LVOT Area:     3.46 cm  LV Volumes (MOD) LV vol d, MOD A4C: 111.0 ml LV vol s, MOD A4C: 44.3 ml LV SV MOD A4C:     111.0 ml RIGHT VENTRICLE            IVC RV Basal diam:  6.30 cm    IVC diam: 2.70 cm RV Mid diam:    4.90 cm RV S prime:     4.35 cm/s TAPSE (M-mode): 0.7 cm LEFT ATRIUM             Index        RIGHT ATRIUM           Index LA diam:        4.90 cm 2.30 cm/m   RA Area:     28.20 cm LA Vol (A2C):   99.7 ml 46.85 ml/m  RA Volume:   97.30 ml  45.72 ml/m LA Vol (A4C):   93.2 ml 43.80 ml/m LA Biplane Vol: 96.2 ml 45.21 ml/m  AORTIC VALVE AV Area (Vmax):    2.23 cm AV Area (Vmean):   2.22 cm AV Area (VTI):     2.49 cm AV Vmax:           303.00 cm/s AV Vmean:  190.000 cm/s AV VTI:            0.404 m AV Peak Grad:      36.7 mmHg AV Mean Grad:      18.0 mmHg LVOT Vmax:         195.00 cm/s LVOT Vmean:        122.000 cm/s LVOT VTI:          0.290 m LVOT/AV VTI ratio: 0.72  AORTA Ao Root diam: 3.30 cm Ao Asc diam:  3.30 cm MITRAL VALVE                TRICUSPID VALVE MV Area (PHT): 1.73 cm     TR Peak grad:   48.7  mmHg MV Area VTI:   1.13 cm     TR Mean grad:   33.0 mmHg MV Peak grad:  18.0 mmHg    TR Vmax:        349.00 cm/s MV Mean grad:  6.0 mmHg     TR Vmean:       278.0 cm/s MV Vmax:       2.12 m/s MV Vmean:      109.0 cm/s   SHUNTS MV E velocity: 194.00 cm/s  Systemic VTI:  0.29 m MV A velocity: 73.70 cm/s   Systemic Diam: 2.10 cm MV E/A ratio:  2.63 Riley Lam MD Electronically signed by Riley Lam MD Signature Date/Time: 07/04/2023/3:48:39 PM    Final    CARDIAC CATHETERIZATION  Result Date: 07/04/2023 1.  Successful right internal jugular temporary pacemaker with a threshold of 0.3 mA, and output of 5 mA, and a backup rate of 30 bpm. Recommendations: Postprocedural single view chest x-ray and management per cardiothoracic, advanced heart failure, and EP teams.    Medications:     Scheduled Medications:  acyclovir  200 mg Per Tube BID   aspirin  81 mg Per Tube Daily   atorvastatin  10 mg Per Tube QHS   Chlorhexidine Gluconate Cloth  6 each Topical Daily   [START ON 07/06/2023] colchicine  0.3 mg Per Tube Once every 5 days   darbepoetin (ARANESP) injection - DIALYSIS  100 mcg Subcutaneous Q Tue-1800   docusate  100 mg Per Tube BID   escitalopram  10 mg Per Tube QHS   feeding supplement (PROSource TF20)  60 mL Per Tube Daily   folic acid  1 mg Per Tube Daily   guaiFENesin  15 mL Per Tube BID   heparin injection (subcutaneous)  5,000 Units Subcutaneous Q8H   insulin aspart  0-24 Units Subcutaneous Q4H   lidocaine-prilocaine  1 Application Topical Q M,W,F   midodrine  15 mg Per Tube TID   multivitamin  1 tablet Per Tube QHS   mouth rinse  15 mL Mouth Rinse 4 times per day   pantoprazole (PROTONIX) IV  40 mg Intravenous Q12H   QUEtiapine  25 mg Per Tube QHS   sodium chloride flush  3 mL Intravenous Q12H   thiamine  100 mg Per Tube Daily    Infusions:   prismasol BGK 4/2.5 400 mL/hr at 07/05/23 0111    prismasol BGK 4/2.5 400 mL/hr at 07/05/23 0111   sodium chloride 10  mL/hr at 07/05/23 0800   albumin human Stopped (06/23/23 0040)   epinephrine 11 mcg/min (07/05/23 0825)   feeding supplement (PIVOT 1.5 CAL) 65 mL/hr at 07/05/23 0800   piperacillin-tazobactam Stopped (07/05/23 0538)   prismasol BGK 2/2.5 dialysis solution 1,800 mL/hr at 07/05/23 1610  vasopressin 0.03 Units/min (07/05/23 0800)    PRN Medications: sodium chloride, albumin human, bisacodyl **OR** bisacodyl, clonazePAM, heparin, ondansetron (ZOFRAN) IV, mouth rinse, oxyCODONE, phenol, polyethylene glycol, sodium chloride flush, traMADol  Patient Profile   52 y/o AAM w/ mildly reduced systolic heart failure (EF once 45-50% in 2013 but normal on subsequent echos) valvular heart disease including severe AS, 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 EF 55-60% w/ mild RV dysfunction by echo in 6/24.    Admitted for elective triple vale surgery + MAZE. S/p bioprosthetic aortic valve replacement + mitral and tricuspid valve repairs. Post-op course c/b persistent vasoplagic syndrome.   Assessment/Plan  1. Post-op Vasopalegia  - POD #12, remains dependent on EPi support, currently on 11 mcg. VP turned off 8/25. Vaso restarted yesterday for persistent vaso plegia - No overt signs of infection/ possible sepsis other than co-ox being high. Afebrile. WBC did recently jump from 6>11.5>13.2>11.4K today.  - PCT and Lactic Acid stable. On Zosyn - Echo 06/29/23 EF 60-65%, no RWMA, small pericardial effusion present, MV repaired/replaced, trivial MR. TR repaired/replaced, mild TR. AV repaired/replaced.  - Echo 07/04/23: EF 70-75%, severe concentric LVH, RV mod reduced, RV severely enlarged. LA/RA mod dilated. Large pericardial effusion. No evidence of tamponade - Suspected high output HF 2/2 AV fistula  - Continue to monitor, may need RHC   2.  Acute on Chronic  Systolic Heart Failure w/ Prominent RV Dysfunction  - LVEF once 45-50% in 2013 but normal on subsequent echos. Pre-op TTE 6/25 EF 55-60%, GIIIDD (Restrictive) RV mildly reduced, mod PH w/ estimated RVSP 48 mmHg  - Intra-op TEE (while on 4 mcg of Epi) LVEF 45%, RV mod-severely reduced, stable AV prosthesis w/ normal gradient and stable valvuloplasty rings w/ mod residual MS, mean gradient 7 mmgH.   - echo 07/04/23: EF 70-75%, severe concentric LVH, RV mod reduced, RV severely enlarged. LA/RA mod dilated. Large pericardial effusion. No evidence of tamponade.  - CVP 18. Back on CRRT pulling 50-100/hr.  - Midodrine increased to 15 mg TID - once recovered, consider amyloid w/u  - may need RHC/ Swan placement as outlined above    3. 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 Post-op CHB  - EP following. Will need PPM once recovered   - S/p RIJ temp wire yesterday, remains pacer dependent    5. ESRD on HD  - s/p failed kidney transplants in 1999 and again in 2015 - CRRT restarted 8/26   6. H/o Atrial Fibrillation  - S/p MAZE    7. Thrombocytopenia  - plts 150 K  - likely 2/2 acute illness  - no gross bleeding, Hgb 7.7 today   8. ID: ?sepsis component to presentation - On Zosyn per CCM.   9. Large pericardial effusion - seen on echo 07/04/23. Previously small on echo 06/29/23 - no evidence of tamponade - will discuss findings with MD  Length of Stay: 13  Alen Bleacher, NP  07/05/2023, 8:42 AM  Advanced Heart Failure Team Pager [phone] (M-F; 7a - 5p)  Please contact [facility] for night-coverage after hours (5p -7a ) and weekends on [email or web address]  Patient seen with NP,  agree with the above note.   I reviewed his echo, EF 70-75%, severe LVH, severe RV enlargement with moderate RV dysfunction, s/p MV  repair with mean gradient 6, s/p TV repair with moderate TR and mean gradient 4, bioprosthetic AoV with mean gradient 18, IVC dilated.   TTVP placed yesterday, pacing at 80 bpm.   Ongoing CVVH, net negative 2036 cc for the last day.  CVP 16 on my read today.  He is currently on epinephrine 11, vasopressin 0.04, midodrine 15 tid.  He is on Zosyn for possible septic shock component, PCT 7.2 => 5.78 (hard to interpret with ESRD).   General: NAD Neck: JVP 14-16 cm, no thyromegaly or thyroid nodule.  Lungs: Clear to auscultation bilaterally with normal respiratory effort. CV: Nondisplaced PMI.  Heart regular S1/S2, no S3/S4, no murmur.  No peripheral edema.   Abdomen: Soft, nontender, no hepatosplenomegaly, no distention.  Skin: Intact without lesions or rashes.  Neurologic: Alert and oriented x 3.  Psych: Normal affect. Extremities: No clubbing or cyanosis.  HEENT: Normal.   Still pressor-dependent, on epinephrine 11 + vasopressin 0.4.  Upon review of echo, I suspect that this is primarily due to RV failure though sepsis picture may play a role.  LV is hyperdynamic, RV is severely dilated and dysfunctional.  There is a mod-large pericardial effusion but no tamponade.   - Continue midodrine to 15 mg tid.  - Continue vasopressin.  - Weaning down epinephrine gradually, will aim for goal SBP > 95.  Will start dobutamine 2.5 for RV support and to help with weaning off epinephrine.  - Suspect this will be a slow process given severe RV dysfunction.    CVP 16 today, continue CVVH aiming for net negative 100 cc/hr.     Continue Zosyn for possible contribution of septic shock.  Procalcitonin has been high but not sure how helpful with ESRD.    Has complete heart block with rate in 50s.  Epicardial leads were only intermittently capturing.  Now with TTVP functioning appropriately.   CRITICAL CARE Performed by: Marca Ancona  Total critical care time: 40 minutes  Critical care time was exclusive  of separately billable procedures and treating other patients.  Critical care was necessary to treat or prevent imminent or life-threatening deterioration.  Critical care was time spent personally by me on the following activities: development of treatment plan with patient and/or surrogate as well as nursing, discussions with consultants, evaluation of patient's response to treatment, examination of patient, obtaining history from patient or surrogate, ordering and performing treatments and interventions, ordering and review of laboratory studies, ordering and review of radiographic studies, pulse oximetry and re-evaluation of patient's condition.  Marca Ancona 07/05/2023 10:24 AM

## 2023-07-05 NOTE — Progress Notes (Addendum)
      [address]       [name] [address]             [phone]      1 Day Post-Op Procedure(s) (LRB): TEMPORARY PACEMAKER (N/A)  Subjective:  Patient more sleepy this morning.  Responds to my questions, follows commands.[name]  Objective: Vital signs in last 24 hours: Temp:  [96.8 F (36 C)-98.6 F (37 C)] 98.2 F (36.8 C) (08/28 0400) Pulse Rate:  [49-86] 80 (08/28 0700) Cardiac Rhythm: Ventricular paced (08/28 0400) Resp:  [17-34] 21 (08/28 0700) BP: (55-125)/(19-58) 91/28 (08/27 1123) SpO2:  [84 %-100 %] 100 % (08/28 0700) Arterial Line BP: (79-140)/(32-80) 104/37 (08/28 0700) Weight:  [80.6 kg] 80.6 kg (08/28 0500)  Hemodynamic parameters for last 24 hours: CVP:  [0 mmHg-27 mmHg] 0 mmHg  Intake/Output from previous day: 08/27 0701 - 08/28 0700 In: 2575.9 [I.V.:1159.5; NG/GT:1216.3; IV Piggyback:200] Out: 4611.8 [Stool:555]  General appearance: alert, cooperative, and no distress Heart: regular rate and rhythm and paced Lungs: diminished breath sounds bibasilar Abdomen: soft, non-tender; bowel sounds normal; no masses,  no organomegaly Extremities: extremities normal, atraumatic, no cyanosis or edema Wound: clean and dry  Lab Results: Recent Labs    07/04/23 0408 07/05/23 0452  WBC 13.2* 11.4*  HGB 8.1* 7.7*  HCT 27.2* 26.1*  PLT 169 150   BMET:  Recent Labs    07/04/23 1518 07/05/23 0452  NA 134* 134*  K 4.9 5.0  CL 100 102  CO2 24 27  GLUCOSE 145* 164*  BUN 49* 36*  CREATININE 3.54* 2.89*  CALCIUM 10.0 10.3    PT/INR: No results for input(s): "LABPROT", "INR" in the last 72 hours. ABG    Component Value Date/Time   PHART 7.305 (L) 06/28/2023 0756   HCO3 26.3 06/28/2023 0756   TCO2 28 06/28/2023 0756   ACIDBASEDEF 1.0 06/28/2023 0756   O2SAT 89.7 07/05/2023 0452   CBG (last 3)  Recent Labs    07/04/23 2354 07/05/23 0415 07/05/23 0723  GLUCAP 118* 139* 153*    Assessment/Plan: S/P Procedure(s)  (LRB): TEMPORARY PACEMAKER (N/A)  CV- CHB, required temp pacer yesterday, will remove EPW today.. remains on Epi @11  had to restart Vasopressin @ 0.3.. AHF following Pulm-+ edema, no significant pleural effusions, pneumothorax ESRD- unable to do HD in setting of low BP and continued pressor requirements.. on CRRT per Nephrology GI- remains NPO, SLP following.. tube feedings per Cortrak.. per nurse patient pulling at this morning Neuro- more sleepy this morning, responsive when appropriate.Marland Kitchen ammonia level being sent Chronc Anemia- Hgb at 7.7 Deconditioning- severe, continue PT/OT   Continue ICU care   LOS: 13 days    Mark Dandy, PA-C 07/05/2023  Patient examined, yesterday's 2D echocardiogram reviewed with Dr. Ronelle Nigh for coordination of care. There is a small pericardial effusion measuring 1 cm diameter along the left ventricle.  There is no evidence of tamponade and there is no indication for drainage.  The patient's LV function and valvular function are fine but RV is dilated and very hypocontractile.  The patient will have his inotropic support for RV dyfunction titrated by advanced heart failure. Surgical incisions and most recent chest x-ray are satisfactory.  The permanent RV wire is now pacing satisfactorily.   patient examined and medical record reviewed,agree with above note. [name] 07/05/2023

## 2023-07-05 NOTE — Progress Notes (Addendum)
NAME:  [name], MRN:  [id number], DOB:  07-Sep-1971, LOS: 13 ADMISSION DATE:  06/22/2023, CONSULTATION DATE: 06/22/2023 REFERRING MD: Leafy Ro - TCTS, CHIEF COMPLAINT: Postcardiac surgery  History of Present Illness:  This is a 52 year old gentleman, past medical history of anxiety depression, bipolar disease, congestive heart failure chronic diastolic heart failure kidney disease followed by renal transplant in 1998-08-02 repeat renal transplant in 2014-08-02.  Patient underwent surgery today for severe mitral regurgitation severe tricuspid regurgitation and moderate aortic stenosis.  He also has atrial fibrillation received maze procedure.  He had a complex mitral valve repair a tricuspid valve annuloplasty and aortic valve replacement followed by full right and left maze procedure.  Pertinent Medical History:   Past Medical History:  Diagnosis Date   ADHD (attention deficit hyperactivity disorder)    Anemia    Anemia, chronic disease 07/13/2011   Anxiety    Arthritis    Bipolar 2 disorder (HCC) 08/18/2011   Diagnosed in 2010/08/02.    Bipolar disorder (HCC)    Blood dyscrasia    Nephrotic Anemia   Blood transfusion    Blood transfusion without reported diagnosis    Chronic diastolic CHF (congestive heart failure) (HCC)    Complication of anesthesia    Woke up intubated gets combative  afraid to be alone    Congestive heart failure (CHF) (HCC) 07/13/2011   Onset 02-Aug-2005.  Followed by Dr. Eden Emms.  S/p cardiac catheterization in [facility] Dr. Anne Fu..  S/p cardiac catheterization in 08-02-2013 by Nishan.  Echo August 02, 2013.    Depression    Dysrhythmia    A. Fib August 03, 2023 with DCCV in May 2024   Erectile dysfunction    ESRD on hemodialysis (HCC) 07/13/2011   Glomerulonephritis at age 20, started HD in Aug 03, 1995.  Deceased donor renal transplant 08/02/98 at [facility], then kidney failed and went back on HD in 02-Aug-2005.  L forearm AVF . s/p repeat renal transplant 10/2014 [facility].   GI bleed 11/01/2011   Rectal bleeding with  emesis/diarrhea    Heart murmur    Hemodialysis status (HCC)    Hydrocele, right 11/25/2014   Hypertension    Hypertensive emergency 01/06/2013   Hypoglycemia 01/11/2013   Influenza-like illness 12/18/2013   Mild ascending aorta dilatation (HCC)    NICM (nonischemic cardiomyopathy) (HCC)    Pericardial effusion    a. Mod by echo in 08-02-2013, similar to prior.   Peripheral polyneuropathy 08/04/2016   Pneumonia    Pulmonary edema 01/06/2013   Pulmonary hypertension associated with end stage renal disease on dialysis [facility]) 07/13/2011   Renal transplant, status post 11/25/2014   Pt with Glomerulonephritis at age 15. Deceased donor renal transplant 02-Aug-1998 right and left renal transplant 10/2014.  Followed by [facility] transplant team; nephrologist is Dr. Lonna Cobb.    Respiratory failure with hypoxia (HCC) 01/06/2013   Shortness of breath 12/12/2012   Swollen testicle 07/13/2011   URI (upper respiratory infection) 01/25/2012    Significant Hospital Events: Including procedures, antibiotic start and stop dates in addition to other pertinent events   8/15 - AVR, MVR, TVR with TCTS ([place]). Remains on multiple pressors (NE, Epi, vaso). 8/16 - LIJ Trialysis catheter placed for CRRT.  8/17 extubated  8/18 - remains on CVVHD, 3 pressors  8/19-8/20 improving pressor needs 8/21 agitated delirium; cortrak 8/26 CRRT restarted 8/27 TVP placed, brady overnight and pacing turned to 80  Interim History / Subjective:  Paced at 82. Epi at 12, Vaso 0.03.  Sleepy but arouseable and interactive, follows commands.  Objective:  Blood pressure (!) 91/28, pulse 83, temperature 98.5 F (36.9 C), temperature source Oral, resp. rate (!) 23, height 6\' 3"  (1.905 m), weight 80.6 kg, SpO2 100%. CVP:  [0 mmHg-27 mmHg] 14 mmHg      Intake/Output Summary (Last 24 hours) at 07/05/2023 0904 Last data filed at 07/05/2023 0800 Gross per 24 hour  Intake 2460.83 ml  Output 4334.6 ml  Net -1873.77 ml   Filed  Weights   07/03/23 0500 07/04/23 0500 07/05/23 0500  Weight: 86.5 kg 84.4 kg 80.6 kg   Physical Examination:  General: Adult male, resting in bed, in NAD. Neuro: Sleepy but awakens easily to voice, answers questions appropriately, follows basic commands. Globally a bit weak from deconditioning. HEENT: [place]/AT. Sclerae anicteric. EOMI. Cardiovascular: TVP at 82. 3/6 SEM.  Lungs: Respirations even and unlabored.  CTA bilaterally, No W/R/R. Abdomen: BS x 4, soft, NT/ND.  Musculoskeletal: No gross deformities, no edema.  Skin: Intact, warm, no rashes.  Assessment & Plan:   Mitral valve regurgitation, tricuspid regurgitation and aortic stenosis status post valvular repair x 3 Postoperative junctional rhythm/CHB  - now pacer dependent (s/p TVP placement 8/27), eventual need for PPM Postoperative distributive shock and vasoplegia and  component of RV dysfunction. AoC sCHF - intra-op TEE with EF 45% (on 4 epi), RV mod-severely reduced, Hx Afib s/p MAZE - TCTS managing. - EP following. - Continue TVP. - EPW removal today. - Continue Epi and vaso, wean as able. - Continue Midodrine. - PT/OT as able.  Pericardial effusion - larger. - Cards discussing.  Possible PNA. - Continue Zosyn. - Follow PCT.  Right pleural effusion - s/p pigtail chest tube with subsequent removal 8/25. - Supportive care. - Push IS.  Acute renal failure in the setting of hemodynamic insults on CKD. History of kidney disease status post renal transplant (failed 1999 and 2015). - Nephrology following. - Continue CRRT. - Follow BMP.  ABLA, postoperative as expected.  No active bleeding at present. Thrombocytopenia, ?chronic- stable. - Transfuse for Hgb < 7. - Monitor for bleeding.  Postoperative encephalopathy, ICU delirium- now seems to have cleared. Ammonia ULN. Situational anxiety. - Continue Escitalopram, Seroquel at bedtime.  Moderate malnutrition. - TF's. - SLP following, now that he is a bit more  alert he might do better with swallowing etc.  Possible gout flare. - Continue Colchicine.  Deconditioning. - PT/OT as able.   Best Practice (right click and "Reselect all SmartList Selections" daily)   Diet/type:TF. DVT prophylaxis:heparin ppx GI prophylaxis: PPI Lines: Central line Foley:  No  Code Status:  full code Last date of multidisciplinary goals of care discussion [Per Primary Team]   CC time: 30 min.   Rutherford Guys, PA - [facility] For pager details, please see AMION or use Epic chat  After 1900, please call [facility] for cross coverage needs 07/05/2023, 9:04 AM

## 2023-07-05 NOTE — Progress Notes (Addendum)
OT Cancellation Note  Patient Details Name: [name] MRN: [id number] DOB: 23-Feb-1971   Cancelled Treatment:    Reason Eval/Treat Not Completed: Active bedrest order for transvenous pacer  Carver Fila, OTD, OTR/L SecureChat [facility] [phone]   Carver Fila Koonce 07/05/2023, 7:15 AM

## 2023-07-05 NOTE — Progress Notes (Signed)
[facility] Progress Note  Subjective:   He had 3.6 liters UF over 8/27 with CRRT.  He has had a goal of net negative 50 to 100 mL/hr.  EP is seeing for heart block and he had a temporary pacer placed.  Epi is at 13 mcg/min and vasopressin was added.  Midodrine was increased as well.  At one point down to 8 mcg/min of epi yesterday.  Just answered orienting questions correctly for RN 30 minutes ago but is sleepy on my exam (does follow simple command but doesn't answer questions).  He slept ok overnight but had an active day yesterday.  RN will watch mental status and reach out to team if no improvement.   Review of systems:    Unable to obtain.  Sleepy this AM.  Does follow commands but doesn't answer questions   Vitals:   07/05/23 0445 07/05/23 0500 07/05/23 0515 07/05/23 0530  BP:      Pulse: 80 80 78 86  Resp: (!) 24 (!) 23 (!) 22 (!) 22  Temp:      TempSrc:      SpO2: 100% 99% 100% 100%  Weight:  80.6 kg    Height:        Physical Exam:     General adult male in bed in no acute distress HEENT normocephalic atraumatic extraocular movements intact sclera anicteric Neck supple trachea midline Lungs clear but reduced to auscultation bilaterally normal work of breathing at rest; on 2 liters nasal cannula   Heart S1S2 Abdomen soft nontender nondistended Extremities no edema  Psych normal mood and affect Neuro - alert and oriented x 3 he follows commands Access RUE AVF in place; left internal jugular nontunneled dialysis catheter      OP HD: [place] triad MWF nocturnal 3h    F250     94kg   2K/2.5 Ca bath   RUE AVF    Summary: [name] is a/an 52 y.o. male with a past medical history notable for ESRD on HD admitted with severe MR and TR and aortic stenosis s/p repair/replacement.    Assessment/ Plan  # Severe MR and TR and aortic stenosis - Status post Tri valve repair on 8/15.  Per cardiothoracic surgery  # Complete heart block - post-op, per EP/  cardiology   # ESRD - pt was too hypotensive for hemodialysis post-op.  He is s/p CRRT 8/16 - 8/25.  Then was restarted on CRRT on 8/26 to optimize volume in the setting of continued pressor requirement  - Continue CRRT.  UF goal net negative 50 to 100 ml/hr as tolerated.  Please contact nephrology for changes to UF goal  - Transition to 2K for dialysate    # Distributive Shock postoperatively - Pressors per primary team.  On midodrine.  Fluid volume overload is improved.  Weight charted as 80.6 kg on 8/28 (from 95 kg on 8/15).    # Anemia of ESRD - transfuse as needed per primary team.  Concurrent thrombocytopenia improved.  On aranesp 100 mcg weekly on Tuesdays started 8/27   # MBD ckd - hypercalcemia improved.  Note he was transitioned off of nepro after starting back on CRRT.  Phos acceptable.  No binders    # Vascular access - RUE AVF functioning well but had been unable to use due to shock.  Temporary dialysis catheter placed for CRRT.    Disposition - in ICU on CRRT   Recent Labs  Lab 07/04/23 0408 07/04/23  1518 07/05/23 0452  HGB 8.1*  --  7.7*  ALBUMIN 2.4* 2.3* 2.4*  2.4*  CALCIUM 10.3 10.0 10.3  PHOS 3.7 4.2 4.1  CREATININE 3.99* 3.54* 2.89*  K 4.6 4.9 5.0   No results for input(s): "IRON", "TIBC", "FERRITIN" in the last 168 hours.  Inpatient medications:  acyclovir  200 mg Per Tube BID   atorvastatin  10 mg Per Tube QHS   Chlorhexidine Gluconate Cloth  6 each Topical Daily   [START ON 07/06/2023] colchicine  0.3 mg Per Tube Once every 5 days   darbepoetin (ARANESP) injection - DIALYSIS  100 mcg Subcutaneous Q Tue-1800   docusate  100 mg Per Tube BID   escitalopram  10 mg Per Tube QHS   feeding supplement (PROSource TF20)  60 mL Per Tube Daily   folic acid  1 mg Per Tube Daily   guaiFENesin  15 mL Per Tube BID   heparin injection (subcutaneous)  5,000 Units Subcutaneous Q8H   insulin aspart  0-24 Units Subcutaneous Q4H   lidocaine-prilocaine  1 Application  Topical Q M,W,F   midodrine  15 mg Per Tube TID   multivitamin  1 tablet Per Tube QHS   mouth rinse  15 mL Mouth Rinse 4 times per day   pantoprazole (PROTONIX) IV  40 mg Intravenous Q12H   QUEtiapine  25 mg Per Tube QHS   sodium chloride flush  3 mL Intravenous Q12H   thiamine  100 mg Per Tube Daily     prismasol BGK 4/2.5 400 mL/hr at 07/05/23 0111    prismasol BGK 4/2.5 400 mL/hr at 07/05/23 0111   sodium chloride 10 mL/hr at 07/05/23 0500   albumin human Stopped (06/23/23 0040)   epinephrine 13 mcg/min (07/05/23 0500)   feeding supplement (PIVOT 1.5 CAL) 50 mL/hr at 07/05/23 0500   piperacillin-tazobactam 3.375 g (07/05/23 0507)   prismasol BGK 4/2.5 1,500 mL/hr at 07/05/23 0519   vasopressin 0.03 Units/min (07/05/23 0500)   sodium chloride, albumin human, bisacodyl **OR** bisacodyl, clonazePAM, heparin, ondansetron (ZOFRAN) IV, mouth rinse, oxyCODONE, phenol, polyethylene glycol, sodium chloride flush, traMADol     Estanislado Emms, MD 6:08 AM 07/05/2023

## 2023-07-05 NOTE — Progress Notes (Signed)
Nutrition Follow-up  DOCUMENTATION CODES:   Severe malnutrition in context of chronic illness  INTERVENTION:   Discussed concerns regarding Diastolic <50, increasing pressor requirements, symptomatic with nausea/dry heaves and TF with Dr. Merrily Pew. Plan to hold TF today, reassess tomorrow  Change Thiamine and Folic Acid to IV administration given concern for deficiency and possible GI malabsorption  Increase Renal MVI to BID while on CRRT  Awaiting micronutrient labs for further recommendations  NUTRITION DIAGNOSIS:   Severe Malnutrition related to chronic illness as evidenced by severe muscle depletion, severe fat depletion.  Being addressed  GOAL:   Patient will meet greater than or equal to 90% of their needs  Progressing   MONITOR:   PO intake, TF tolerance, Labs, Weight trends  REASON FOR ASSESSMENT:   Consult Assessment of nutrition requirement/status  ASSESSMENT:   52 y/o male with h/o glomerulonephritis (age 36) s/p renal transplant (1999) and right and left renal transplants (2015), ESRD on HD (>20 yrs), HTN, bipolar 2 disorder, PTSD, ADHD, anxiety, depression, GERD, OSA, NICM, thrombocytopenia, pulmonary hypertension, afib s/p DCCV (03/2019), CHF, severe mitral and tricuspid valve regurgitation and moderate aortic stenosis s/p mitral valve repair, tricuspid valve annuloplasty, aortic valve replacement and full right and left maze procedure 8/16.  8/15 AVR, MVR, TVR by Dr. Leafy Ro 8/16 CRRT initiated 8/17 Extubated, CL diet 8/19 Chest tube inserted for effusion, BiPap overnight 8/20 No BM, abd xray with nonobstructive bowel gas pattern 8/21 Cortrak placed, TF initiated 8/22 NPO due to mental status, TF continue 8/25 CRRT discontinued  8/26 CRRT restarted  Pt remains lethargic but arousable. Remains very weak and deconditioned. Noted SLP re-evaluated today and pt remains NPO  Pressor requirements up from last assessment; Epinephrine up to 10, vasopressin 0.03,  dobutamine added at 2.5 today. Also receiving midodrine   Pt continues with intermittent nausea; upon assessment today, RD noted that MAP in 50s, Diastolic 30s. Per RN, pressors being titrated for Systolic >95.   Pivot 1.5 at goal of 65 ml/hr this AM. RD concerned about TF infusion given increased risk of ischemia given the likelihood of poor gut perfusion currently given increasing pressor requirements with Diastolic <50 and pt with symptoms of GI intolerance (nausea, dry heaving, previously vomiting). RD to discussed with Dr. Merrily Pew   Micronutrient Labs:  CRP: 7.9 (H) Ceruloplasmin 36.5 (H) Copper: pending Folate, serum: 3.6 (L) 06/26/23 Iron, serum: 121(wdl) Thiamine: pending Vitamin B12:  1175 (H) 06/26/23 Vitamin B6: 21.6 (wdl) Vitamin C: pending Zinc: pending   Labs:  Corrected calcium 11.6 (H)-albumin 2.4 (L), sodium 134 (L), potassium 5.0 (wdl) Meds: ss novolog, folic acid, thiamine, Rena-vite daily, midodrine   Diet Order:   Diet Order             Diet NPO time specified  Diet effective now                   EDUCATION NEEDS:   Not appropriate for education at this time  Skin:  Skin Assessment: Skin Integrity Issues: Skin Integrity Issues:: Incisions Incisions: sternal  Last BM:  type 7 stool via rectal tube  Height:   Ht Readings from Last 1 Encounters:  07/04/23 6\' 3"  (1.905 m)    Weight:   Wt Readings from Last 1 Encounters:  07/05/23 80.6 kg    Ideal Body Weight:  89 kg  BMI:  Body mass index is 22.21 kg/m.  Estimated Nutritional Needs:   Kcal:  2400-2600 kcals  Protein:  155-190 g  Fluid:  1L plus UOP   [name] MS, RDN, LDN, CNSC Registered Dietitian 3 Clinical Nutrition RD Pager and On-Call Pager Number Located in [place]

## 2023-07-06 ENCOUNTER — Inpatient Hospital Stay (HOSPITAL_COMMUNITY): Payer: Medicare HMO

## 2023-07-06 DIAGNOSIS — I50810 Right heart failure, unspecified: Secondary | ICD-10-CM | POA: Diagnosis not present

## 2023-07-06 DIAGNOSIS — I3139 Other pericardial effusion (noninflammatory): Secondary | ICD-10-CM | POA: Diagnosis not present

## 2023-07-06 DIAGNOSIS — R579 Shock, unspecified: Secondary | ICD-10-CM

## 2023-07-06 DIAGNOSIS — N179 Acute kidney failure, unspecified: Secondary | ICD-10-CM

## 2023-07-06 DIAGNOSIS — I442 Atrioventricular block, complete: Secondary | ICD-10-CM | POA: Diagnosis not present

## 2023-07-06 DIAGNOSIS — I5023 Acute on chronic systolic (congestive) heart failure: Secondary | ICD-10-CM | POA: Diagnosis not present

## 2023-07-06 DIAGNOSIS — R570 Cardiogenic shock: Secondary | ICD-10-CM | POA: Diagnosis not present

## 2023-07-06 DIAGNOSIS — Z9889 Other specified postprocedural states: Secondary | ICD-10-CM | POA: Diagnosis not present

## 2023-07-06 LAB — GLUCOSE, CAPILLARY
Glucose-Capillary: 114 mg/dL — ABNORMAL HIGH (ref 70–99)
Glucose-Capillary: 114 mg/dL — ABNORMAL HIGH (ref 70–99)
Glucose-Capillary: 117 mg/dL — ABNORMAL HIGH (ref 70–99)
Glucose-Capillary: 117 mg/dL — ABNORMAL HIGH (ref 70–99)
Glucose-Capillary: 120 mg/dL — ABNORMAL HIGH (ref 70–99)
Glucose-Capillary: 120 mg/dL — ABNORMAL HIGH (ref 70–99)

## 2023-07-06 LAB — CBC
HCT: 25.4 % — ABNORMAL LOW (ref 39.0–52.0)
Hemoglobin: 7.5 g/dL — ABNORMAL LOW (ref 13.0–17.0)
MCH: 32.2 pg (ref 26.0–34.0)
MCHC: 29.5 g/dL — ABNORMAL LOW (ref 30.0–36.0)
MCV: 109 fL — ABNORMAL HIGH (ref 80.0–100.0)
Platelets: 170 10*3/uL (ref 150–400)
RBC: 2.33 MIL/uL — ABNORMAL LOW (ref 4.22–5.81)
RDW: 19 % — ABNORMAL HIGH (ref 11.5–15.5)
WBC: 11.2 10*3/uL — ABNORMAL HIGH (ref 4.0–10.5)
nRBC: 1.2 % — ABNORMAL HIGH (ref 0.0–0.2)

## 2023-07-06 LAB — COOXEMETRY PANEL
Carboxyhemoglobin: 2.5 % — ABNORMAL HIGH (ref 0.5–1.5)
Methemoglobin: <0.7 % (ref 0.0–1.5)
O2 Saturation: 89.3 %
Total hemoglobin: 8.1 g/dL — ABNORMAL LOW (ref 12.0–16.0)

## 2023-07-06 LAB — RENAL FUNCTION PANEL
Albumin: 2.3 g/dL — ABNORMAL LOW (ref 3.5–5.0)
Albumin: 2.4 g/dL — ABNORMAL LOW (ref 3.5–5.0)
Anion gap: 9 (ref 5–15)
Anion gap: 7 (ref 5–15)
BUN: 29 mg/dL — ABNORMAL HIGH (ref 6–20)
BUN: 25 mg/dL — ABNORMAL HIGH (ref 6–20)
CO2: 24 mmol/L (ref 22–32)
CO2: 25 mmol/L (ref 22–32)
Calcium: 10.5 mg/dL — ABNORMAL HIGH (ref 8.9–10.3)
Calcium: 10.4 mg/dL — ABNORMAL HIGH (ref 8.9–10.3)
Chloride: 100 mmol/L (ref 98–111)
Chloride: 99 mmol/L (ref 98–111)
Creatinine, Ser: 2.76 mg/dL — ABNORMAL HIGH (ref 0.61–1.24)
Creatinine, Ser: 2.51 mg/dL — ABNORMAL HIGH (ref 0.61–1.24)
GFR, Estimated: 27 mL/min — ABNORMAL LOW (ref >60)
GFR, Estimated: 30 mL/min — ABNORMAL LOW (ref >60)
Glucose, Bld: 130 mg/dL — ABNORMAL HIGH (ref 70–99)
Glucose, Bld: 113 mg/dL — ABNORMAL HIGH (ref 70–99)
Phosphorus: 5 mg/dL — ABNORMAL HIGH (ref 2.5–4.6)
Phosphorus: 4.6 mg/dL (ref 2.5–4.6)
Potassium: 4.6 mmol/L (ref 3.5–5.1)
Potassium: 4.4 mmol/L (ref 3.5–5.1)
Sodium: 133 mmol/L — ABNORMAL LOW (ref 135–145)
Sodium: 131 mmol/L — ABNORMAL LOW (ref 135–145)

## 2023-07-06 LAB — CG4 I-STAT (LACTIC ACID): Lactic Acid, Venous: 0.9 mmol/L (ref 0.5–1.9)

## 2023-07-06 LAB — ECHOCARDIOGRAM LIMITED
Height: 75 in
Weight: 2899.49 oz

## 2023-07-06 LAB — MAGNESIUM: Magnesium: 2.8 mg/dL — ABNORMAL HIGH (ref 1.7–2.4)

## 2023-07-06 MED ORDER — THIAMINE HCL 100 MG/ML IJ SOLN
500.0000 mg | Freq: Three times a day (TID) | INTRAVENOUS | Status: AC
  Administered 2023-07-06 – 2023-07-08 (×6): 500 mg via INTRAVENOUS
  Filled 2023-07-06 (×6): qty 5

## 2023-07-06 MED ORDER — DARBEPOETIN ALFA 150 MCG/0.3ML IJ SOSY
150.0000 ug | PREFILLED_SYRINGE | INTRAMUSCULAR | Status: DC
  Administered 2023-07-11 – 2023-07-25 (×3): 150 ug via SUBCUTANEOUS
  Filled 2023-07-06 (×3): qty 0.3

## 2023-07-06 MED ORDER — THIAMINE MONONITRATE 100 MG PO TABS: 100.0000 mg | ORAL_TABLET | Freq: Every day | ORAL | Status: DC

## 2023-07-06 NOTE — Progress Notes (Addendum)
[facility] Progress Note  Subjective:   He had 3.8 liters UF over 8/28 with CRRT (and 4.1 liters the 24 hours prior to that).  He has had a goal of net negative 50 to 100 mL/hr but he has been kept even more recently due to instability.  CVP 12-14 per RN.  Epi was increased to 20 mcg/min and vaso was increased to 0.04 units/min.  Team added dobutamine yesterday for RV support per RN. He has been more interactive - yesterday AM still had pain meds in system per RN - also hard to sleep with all lines in neck.  Still with pacer - now set at 82 per EP yesterday AM.  Per RN verbal report weight is 82.2 kg today.   Review of systems:     Reports 3/10 pain - states in neck and nods that this is due to positioning/discomfort from lines on question from RN  Breathing is ok; does have cough Mental status better   Vitals:   07/06/23 0415 07/06/23 0430 07/06/23 0445 07/06/23 0500  BP:      Pulse: 82 82 89 82  Resp: (!) 21 17 20 20   Temp:      TempSrc:      SpO2: 100% 100% 94% 100%  Weight:      Height:        Physical Exam:      General adult male in bed in no acute distress HEENT normocephalic atraumatic extraocular movements intact sclera anicteric Neck supple trachea midline Lungs clear but reduced to auscultation bilaterally normal work of breathing at rest; on 2 liters nasal cannula   Heart S1S2 Abdomen soft nontender nondistended Extremities no edema  Psych normal mood and affect Neuro - sleepy but wakes with stimulation; follows basic motor commands Access RUE AVF in place; left internal jugular nontunneled dialysis catheter      OP HD: [facility] triad MWF nocturnal 3h    F250     94kg   2K/2.5 Ca bath   RUE AVF    Summary: [name] is a/an 52 y.o. male with a past medical history notable for ESRD on HD admitted with severe MR and TR and aortic stenosis s/p repair/replacement.    Assessment/ Plan  # Severe MR and TR and aortic stenosis  - Status post Tri  valve repair on 8/15.  Per cardiothoracic surgery  # Complete heart block - post-op, per EP/ cardiology   # ESRD - pt was too hypotensive for hemodialysis post-op.  He is s/p CRRT 8/16 - 8/25.  Then was restarted on CRRT on 8/26 to optimize volume in the setting of continued pressor requirement  - Continue CRRT.  UF goal of keep even to net negative 50 ml/hr as tolerated.  Please contact nephrology for changes to UF goal.  Will reach out to CHF team to confirm their goals for UF as well  - on 2K dialysate for now    # Distributive Shock postoperatively - Pressors per primary team.  On midodrine.  Fluid volume overload is improved.  Last weight charted as 80.6 kg on 8/28 (from 95 kg on 8/15).  Addendum - Per RN verbal report weight is 82.2 kg today.    # Anemia of ESRD - transfuse as needed per primary team.  Concurrent thrombocytopenia improved.  ESA/Aranesp 100 mcg weekly on Tues started back 8/27.  Will increase to 150 mcg weekly on Tuesdays    # MBD ckd - hypercalcemia improved.  Note he was transitioned off of nepro after starting back on CRRT.  Phos acceptable.  No binders    # Vascular access - RUE AVF functioning well but had been unable to use due to shock.  Temporary dialysis catheter placed for CRRT.    Disposition - in ICU on CRRT   Recent Labs  Lab 07/05/23 0452 07/05/23 1609 07/06/23 0455  HGB 7.7*  --  7.5*  ALBUMIN 2.4*  2.4* 2.3* 2.3*  CALCIUM 10.3 10.3 10.5*  PHOS 4.1 4.2 5.0*  CREATININE 2.89* 2.76* 2.76*  K 5.0 4.9 4.6   No results for input(s): "IRON", "TIBC", "FERRITIN" in the last 168 hours.  Inpatient medications:  acyclovir  200 mg Per Tube BID   aspirin  81 mg Per Tube Daily   atorvastatin  10 mg Per Tube QHS   Chlorhexidine Gluconate Cloth  6 each Topical Daily   colchicine  0.3 mg Per Tube Once every 5 days   darbepoetin (ARANESP) injection - DIALYSIS  100 mcg Subcutaneous Q Tue-1800   docusate  100 mg Per Tube BID   escitalopram  10 mg Per Tube  Daily   feeding supplement (PROSource TF20)  60 mL Per Tube Daily   folic acid  1 mg Intravenous Daily   guaiFENesin  15 mL Per Tube BID   heparin injection (subcutaneous)  5,000 Units Subcutaneous Q8H   insulin aspart  0-24 Units Subcutaneous Q4H   lidocaine-prilocaine  1 Application Topical Q M,W,F   metoCLOPramide (REGLAN) injection  5 mg Intravenous Q8H   midodrine  15 mg Per Tube TID   multivitamin  1 tablet Per Tube BID   mouth rinse  15 mL Mouth Rinse 4 times per day   pantoprazole (PROTONIX) IV  40 mg Intravenous Q12H   QUEtiapine  25 mg Per Tube QHS   sodium chloride flush  3 mL Intravenous Q12H   thiamine (VITAMIN B1) injection  100 mg Intravenous Daily     prismasol BGK 4/2.5 400 mL/hr at 07/06/23 0340    prismasol BGK 4/2.5 400 mL/hr at 07/06/23 0340   sodium chloride Stopped (07/05/23 2213)   sodium chloride 10 mL/hr at 07/06/23 0500   albumin human Stopped (06/23/23 0040)   DOBUTamine 2.5 mcg/kg/min (07/06/23 0500)   epinephrine 19 mcg/min (07/06/23 0500)   prismasol BGK 2/2.5 dialysis solution 1,800 mL/hr at 07/06/23 0217   vasopressin 0.04 Units/min (07/06/23 0500)   sodium chloride, sodium chloride, albumin human, bisacodyl **OR** bisacodyl, clonazePAM, heparin, ondansetron (ZOFRAN) IV, mouth rinse, oxyCODONE, phenol, polyethylene glycol, sodium chloride flush, traMADol     Estanislado Emms, MD 6:11 AM 07/06/2023

## 2023-07-06 NOTE — Progress Notes (Signed)
Echocardiogram 2D Echocardiogram has been performed.  Warren Lacy Latrenda Irani RDCS 07/06/2023, 9:55 AM

## 2023-07-06 NOTE — Progress Notes (Signed)
Physical Therapy Treatment Patient Details Name: [name] MRN: [id number] DOB: 1971/01/28 Today's Date: 07/06/2023   History of Present Illness Pt is a 52 y.o. male admitted 06/22/23 for same day planned tricuspid valve repair, mitral valve repair, aortic valve replacement, maze, TEE. Post-op course complicated by CHB, shock, AKI, respiratory failure. CRRT initiated 8/16, then resumed 8/26. ETT 8/15-8/17. PMH includes CHF, HTN, NICM, polyneuropathy, ESRD on HD (s/p renal transplant 1999, 2015), bipolar, ADHD, anxiety, depression.    PT Comments  Patient participating with in bed therex per RN not stable for EOB/OOB.  Patient initiating well to follow commands and with less assistance than last session of in bed exercises.  Seems motivated to continue to progress despite medical issues.  Continue to feel he will benefit from intensive inpatient rehab when stable.  PT to continue to follow.     If plan is discharge home, recommend the following: A lot of help with walking and/or transfers;A lot of help with bathing/dressing/bathroom;Assistance with cooking/housework;Assist for transportation;Help with stairs or ramp for entrance   Can travel by private vehicle        Equipment Recommendations  Other (comment) (TBA)    Recommendations for Other Services       Precautions / Restrictions Precautions Precautions: Fall;Sternal Precaution Comments: CRRT- restarted 8/26, external pacer Restrictions Weight Bearing Restrictions: Yes (sternal precautions)     Mobility  Bed Mobility Overal bed mobility: Needs Assistance Bed Mobility: Rolling Rolling: Mod assist, +2 for safety/equipment, Used rails         General bed mobility comments: rolling to L with cues, RN to assist for positioning off bottom    Transfers                        Ambulation/Gait                   Stairs             Wheelchair Mobility     Tilt Bed    Modified Rankin (Stroke  Patients Only)       Balance                                            Cognition Arousal: Lethargic Behavior During Therapy: Flat affect Overall Cognitive Status: Impaired/Different from baseline                                 General Comments: remains lethargic, though initating exercises well today needs continued cues to stay alert.        Exercises General Exercises - Upper Extremity Shoulder Flexion: Supine, Both, 10 reps, Strengthening Elbow Flexion: Strengthening, Both, 10 reps, Supine General Exercises - Lower Extremity Ankle Circles/Pumps: AROM, 10 reps, Both, Supine Short Arc Quad: AROM, Both, Supine, 10 reps, Strengthening Heel Slides: Both, AROM, 10 reps, Supine Hip ABduction/ADduction: Strengthening, Both, 10 reps, Supine (isometric hip adductor squeezes w/ 5 sec hold)    General Comments General comments (skin integrity, edema, etc.): VSS in supine position per RN to remain in bed with lines and needing pressor support while on CRRT      Pertinent Vitals/Pain Pain Assessment Pain Assessment: Faces Faces Pain Scale: Hurts little more Pain Location: R knee pain Pain Descriptors / Indicators: Discomfort, Grimacing Pain Intervention(s): Monitored during  session, Repositioned, Limited activity within patient's tolerance    Home Living                          Prior Function            PT Goals (current goals can now be found in the care plan section) Progress towards PT goals: Progressing toward goals    Frequency    Min 1X/week      PT Plan      Co-evaluation              AM-PAC PT "6 Clicks" Mobility   Outcome Measure  Help needed turning from your back to your side while in a flat bed without using bedrails?: Total Help needed moving from lying on your back to sitting on the side of a flat bed without using bedrails?: Total Help needed moving to and from a bed to a chair (including a  wheelchair)?: Total Help needed standing up from a chair using your arms (e.g., wheelchair or bedside chair)?: Total Help needed to walk in hospital room?: Total Help needed climbing 3-5 steps with a railing? : Total 6 Click Score: 6    End of Session Equipment Utilized During Treatment: Oxygen Activity Tolerance: Patient limited by fatigue Patient left: in bed;with call bell/phone within reach;with SCD's reapplied;with nursing/sitter in room   PT Visit Diagnosis: Other abnormalities of gait and mobility (R26.89);Muscle weakness (generalized) (M62.81)     Time: 6962-9528 PT Time Calculation (min) (ACUTE ONLY): 21 min  Charges:    $Therapeutic Exercise: 8-22 mins PT General Charges $$ ACUTE PT VISIT: 1 Visit                     Sheran Lawless, PT Acute Rehabilitation Services [phone] 07/06/2023    [name] 07/06/2023, 10:58 AM

## 2023-07-06 NOTE — Progress Notes (Signed)
   PMT acknowledges referral received for [facility] :goals of care discussion.   As requested, PMT will attempt to contact family at a later time/date (9/2). Detailed note and recommendations to follow once GOC has been completed.   Thank you for your referral and allowing PMT to assist in [name]'s care.   Richardson Dopp, [facility] Palliative Medicine Team  Team Phone # [phone]   NO CHARGE

## 2023-07-06 NOTE — Progress Notes (Addendum)
Advanced Heart Failure Rounding Note  PCP-Cardiologist: Reatha Harps, MD   Subjective:   8/15: Elective triple vale surgery + MAZE. Underwent aortic valve replacement + mitral and tricuspid valve repairs by Dr. Milinda Antis 8/22: AHF consulted for high-output HF with persistent post-op shock 8/25: CRRT stopped 8/26: CRRT restarted  08/27: S/p TTVP Echo: EF 70-75%, severe LVH, RV mod reduced, RV severely enlarged. LA/RA mod dilated. Large pericardial effusion. No evidence of tamponade.  8/28: On Epi + Vaso. DBA added for RV.  Pressor requirements increased overnight. Epi up to 20 + Vaso up to 0.03 + DBA 2.5. Also on 15 midodrine TID.  ? CO-OX 89%.  ~ 4L volume removed with CRRT last 24 hrs. CVP 10-11. Currently run for net event to slightly positive.  Completed Zosyn for possible septic component to shock.   Wakes up and follows commands. Very weak.    Objective:    Weight Range: 82.2 kg Body mass index is 22.65 kg/m.   Vital Signs:   Temp:  [97.5 F (36.4 C)-98 F (36.7 C)] 98 F (36.7 C) (08/29 0000) Pulse Rate:  [79-106] 82 (08/29 0700) Resp:  [14-31] 19 (08/29 0700) SpO2:  [93 %-100 %] 100 % (08/29 0700) Arterial Line BP: (76-122)/(18-59) 101/35 (08/29 0700) Weight:  [82.2 kg] 82.2 kg (08/29 0500) Last BM Date : 07/05/23  Weight change: Filed Weights   07/04/23 0500 07/05/23 0500 07/06/23 0500  Weight: 84.4 kg 80.6 kg 82.2 kg   Intake/Output:   Intake/Output Summary (Last 24 hours) at 07/06/2023 0755 Last data filed at 07/06/2023 0700 Gross per 24 hour  Intake 2652.62 ml  Output 4123.3 ml  Net -1470.68 ml    Physical Exam  General:  Fatigued appearing. HEENT: + CorTrak Neck: supple. L internal jugular trialysis cath. R subclavian CVC. Cor: PMI nondisplaced. Regular rate & rhythm. No rubs, gallops or murmurs. Lungs: clear Abdomen: soft, nontender, nondistended.  Extremities: no cyanosis, clubbing, rash, edema Neuro: Drowsy but wakes up and follows  commands.   Telemetry   V paced 82  EKG    No new EKG to review  Labs    CBC Recent Labs    07/05/23 0452 07/06/23 0455  WBC 11.4* 11.2*  HGB 7.7* 7.5*  HCT 26.1* 25.4*  MCV 107.4* 109.0*  PLT 150 170   Basic Metabolic Panel Recent Labs    09/02/24 0452 07/05/23 1609 07/06/23 0455  NA 134* 135 133*  K 5.0 4.9 4.6  CL 102 101 100  CO2 27 25 24   GLUCOSE 164* 139* 130*  BUN 36* 34* 29*  CREATININE 2.89* 2.76* 2.76*  CALCIUM 10.3 10.3 10.5*  MG 2.8*  --  2.8*  PHOS 4.1 4.2 5.0*   Liver Function Tests Recent Labs    07/04/23 0408 07/04/23 1518 07/05/23 0452 07/05/23 1609 07/06/23 0455  AST 49*  --  46*  --   --   ALT 13  --  13  --   --   ALKPHOS 89  --  78  --   --   BILITOT 0.8  --  1.0  --   --   PROT 7.7  --  8.1  --   --   ALBUMIN 2.4*   < > 2.4*  2.4* 2.3* 2.3*   < > = values in this interval not displayed.   No results for input(s): "LIPASE", "AMYLASE" in the last 72 hours. Cardiac Enzymes No results for input(s): "CKTOTAL", "CKMB", "CKMBINDEX", "TROPONINI" in the  last 72 hours.  BNP: BNP (last 3 results) No results for input(s): "BNP" in the last 8760 hours.  ProBNP (last 3 results) No results for input(s): "PROBNP" in the last 8760 hours.   D-Dimer No results for input(s): "DDIMER" in the last 72 hours. Hemoglobin A1C No results for input(s): "HGBA1C" in the last 72 hours. Fasting Lipid Panel No results for input(s): "CHOL", "HDL", "LDLCALC", "TRIG", "CHOLHDL", "LDLDIRECT" in the last 72 hours. Thyroid Function Tests No results for input(s): "TSH", "T4TOTAL", "T3FREE", "THYROIDAB" in the last 72 hours.  Invalid input(s): "FREET3"  Other results:   Imaging   No results found.  Medications:     Scheduled Medications:  acyclovir  200 mg Per Tube BID   aspirin  81 mg Per Tube Daily   atorvastatin  10 mg Per Tube QHS   Chlorhexidine Gluconate Cloth  6 each Topical Daily   colchicine  0.3 mg Per Tube Once every 5 days    [START ON 07/11/2023] darbepoetin (ARANESP) injection - DIALYSIS  150 mcg Subcutaneous Q Tue-1800   docusate  100 mg Per Tube BID   escitalopram  10 mg Per Tube Daily   feeding supplement (PROSource TF20)  60 mL Per Tube Daily   folic acid  1 mg Intravenous Daily   guaiFENesin  15 mL Per Tube BID   heparin injection (subcutaneous)  5,000 Units Subcutaneous Q8H   insulin aspart  0-24 Units Subcutaneous Q4H   lidocaine-prilocaine  1 Application Topical Q M,W,F   midodrine  15 mg Per Tube TID   multivitamin  1 tablet Per Tube BID   mouth rinse  15 mL Mouth Rinse 4 times per day   pantoprazole (PROTONIX) IV  40 mg Intravenous Q12H   QUEtiapine  25 mg Per Tube QHS   sodium chloride flush  3 mL Intravenous Q12H   thiamine (VITAMIN B1) injection  100 mg Intravenous Daily    Infusions:   prismasol BGK 4/2.5 400 mL/hr at 07/06/23 0340    prismasol BGK 4/2.5 400 mL/hr at 07/06/23 0340   sodium chloride Stopped (07/05/23 2213)   sodium chloride 10 mL/hr at 07/06/23 0700   albumin human Stopped (06/23/23 0040)   DOBUTamine 2.5 mcg/kg/min (07/06/23 0700)   epinephrine 20 mcg/min (07/06/23 0700)   prismasol BGK 2/2.5 dialysis solution 1,800 mL/hr at 07/06/23 0550   vasopressin 0.04 Units/min (07/06/23 0700)    PRN Medications: sodium chloride, sodium chloride, albumin human, bisacodyl **OR** bisacodyl, clonazePAM, heparin, ondansetron (ZOFRAN) IV, mouth rinse, oxyCODONE, phenol, polyethylene glycol, sodium chloride flush, traMADol  Patient Profile   52 y/o AAM w/ mildly reduced systolic heart failure (EF once 45-50% in 2013 but normal on subsequent echos) valvular heart disease including severe AS, 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 EF 55-60% w/ mild RV dysfunction by echo in 6/24.    Admitted for elective triple vale surgery + MAZE. S/p  bioprosthetic aortic valve replacement + mitral and tricuspid valve repairs. Post-op course c/b persistent vasoplagic syndrome.   Assessment/Plan  1. Post-op Vasopalegia  - Echo 06/29/23 EF 60-65%, no RWMA, small pericardial effusion present, MV repaired/replaced, trivial MR. TR repaired/replaced, mild TR. AV repaired/replaced.  - Echo 07/04/23: EF 70-75%, severe concentric LVH, RV mod reduced, RV severely enlarged. LA/RA mod dilated. Large pericardial effusion. No evidence of tamponade - Suspected high output HF 2/2 AV fistula  -  POD #13, remains dependent on Epi and Vaso. Requirements up overnight. DBA added on 08/28 for RV support.  - No overt signs of infection/ possible sepsis other than co-ox being high. Afebrile. WBC 6>11.5>13.2>11.4>11.2K.  - PCT elevated but in setting of ESRD. Completed Zosyn for possible septic component - Check lactic acid - Repeat limited echo to assess pericardial effusion - CVP 10-11. ? If pulling more volume than RV can tolerate. Now running even on CVVH.  - ? If may need to consider RHC to better assess hemodynamics depending on the above   2.  Acute on Chronic Systolic Heart Failure w/ Prominent RV Dysfunction  - LVEF once 45-50% in 2013 but normal on subsequent echos. Pre-op TTE 6/25 EF 55-60%, GIIIDD (Restrictive) RV mildly reduced, mod PH w/ estimated RVSP 48 mmHg  - Intra-op TEE (while on 4 mcg of Epi) LVEF 45%, RV mod-severely reduced, stable AV prosthesis w/ normal gradient and stable valvuloplasty rings w/ mod residual MS, mean gradient 7 mmgH.   - echo 07/04/23: EF 70-75%, severe concentric LVH, RV mod reduced, RV severely enlarged. LA/RA mod dilated. Large pericardial effusion. No evidence of tamponade.  - Midodrine 15 mg TID - Inotropes/pressors and volume management as above - once recovered, consider amyloid w/u    3. Valvular Heart Disease  - pre-op diagnosis, severe AS, severe MR 2/2 MVP, severe TR - s/p bioprosthetic AVR, mitral and tricuspid  valve repairs - stable gradients on intra-op TEE post repairs - stable on echo 8/22 and 8/27   4. Post-op CHB  - EP following. Will need PPM once recovered   - S/p RIJ temp wire 08/27 - EPW left in place d/t concern for risk of tamponade   5. 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 Nephrology following   6. H/o Atrial Fibrillation  - S/p MAZE    7. Thrombocytopenia  Anemia - Thrombocytopenia resolved - Hgb trended down slowly to 7.5 today. Transfuse per primary team  8. ID: ?sepsis component  - Completed Zosyn per CCM.  - PCT elevated in setting of ESRD  9. Large pericardial effusion - seen on echo 07/04/23. Previously small on echo 06/29/23 - no evidence of tamponade - Limited echo today  Length of Stay: 14  FINCH, LINDSAY N, PA-C  07/06/2023, 7:55 AM  Advanced Heart Failure Team Pager [phone] (M-F; 7a - 5p)  Please contact [facility] for night-coverage after hours (5p -7a ) and weekends on [email or web address]  Patient seen with PA, agree with the above note.   Patient is minimally interactive with me today but will answer questions when prompted. I/Os net negative 1470 yesterday with CVVH.  This morning, CVP 13 with co-ox 89%. He has completed Zosyn course.  He remains on dobutamine 2.5, epinephrine 20, vasopressin 0.04. SBP 120 currently with wide pulse pressure.   General: NAD Neck: JVP 12 cm, no thyromegaly or thyroid nodule.  Lungs: Clear to auscultation bilaterally with normal respiratory effort. CV: Nondisplaced PMI.  Heart regular S1/S2, no S3/S4, no murmur.  No peripheral edema.   Abdomen: Soft, nontender, no hepatosplenomegaly, no distention.  Skin: Intact without lesions or rashes.  Neurologic: Minimally interactive but will answer questions.   Psych: Normal affect. Extremities: No clubbing or cyanosis.  HEENT: Normal.   Still pressor-dependent, on epinephrine 20 + vasopressin 0.4 + dobutamine 2.5.   Pressors were increased overnight.  Upon review of yesterday's echo, I suspect that this  is primarily due to RV failure though sepsis may play a role.  LV is hyperdynamic, RV is severely dilated and dysfunctional.  There is a mod-large pericardial effusion but no tamponade.  There has also been concern for component of high output HF from his fistula, co-ox is elevated at 89%.   - Continue midodrine to 15 mg tid.  - Continue vasopressin.  - Weaning down epinephrine gradually, will aim for goal SBP > 95 (should be able to wean today with SBP 120s).  Will increase dobutamine to 5 for RV support and to help with weaning off epinephrine.  - With severe RV dysfunction, will aim for CVP around 12-13.  Run CVVH even today to aid pressor weaning.  - I am increasingly worried that he is not going to have RV recovery.  Suspect we will need palliative care discussions soon.     Completing Zosyn today for possible contribution of septic shock.  Procalcitonin has been high but not sure how helpful with ESRD.    Has complete heart block with rate in 50s.  Epicardial leads were only intermittently capturing.  Now with TTVP functioning appropriately.   CRITICAL CARE Performed by: Marca Ancona  Total critical care time: 40 minutes  Critical care time was exclusive of separately billable procedures and treating other patients.  Critical care was necessary to treat or prevent imminent or life-threatening deterioration.  Critical care was time spent personally by me on the following activities: development of treatment plan with patient and/or surrogate as well as nursing, discussions with consultants, evaluation of patient's response to treatment, examination of patient, obtaining history from patient or surrogate, ordering and performing treatments and interventions, ordering and review of laboratory studies, ordering and review of radiographic studies, pulse oximetry and re-evaluation of patient's  condition.  Marca Ancona 07/06/2023 9:43 AM

## 2023-07-06 NOTE — Progress Notes (Signed)
Nutrition Follow-up  DOCUMENTATION CODES:   Severe malnutrition in context of chronic illness  INTERVENTION:   Recommend continuing to hold TF at this time  If unable to resume TF soon, recommend initiation of TPN given severity of his malnutrition and increased needs acutely.   Discussed concerns regarding possible severe thiamine deficiency (?beriberi) with Pharmacy and PCCM. Pt receiving 100 mg IV daily currently, thiamine lab still pending. Recommend high dose thiamine; plan for 500 mg IV x 2 days followed by 100 mg IV  Continue IV Folic Acid, continue renal MVI BID  NUTRITION DIAGNOSIS:   Severe Malnutrition related to chronic illness as evidenced by severe muscle depletion, severe fat depletion.  Continues  GOAL:   Patient will meet greater than or equal to 90% of their needs  Not Met  MONITOR:   PO intake, TF tolerance, Labs, Weight trends  REASON FOR ASSESSMENT:   Consult Assessment of nutrition requirement/status  ASSESSMENT:   52 y/o male with h/o glomerulonephritis (age 63) s/p renal transplant (1999) and right and left renal transplants (2015), ESRD on HD (>20 yrs), HTN, bipolar 2 disorder, PTSD, ADHD, anxiety, depression, GERD, OSA, NICM, thrombocytopenia, pulmonary hypertension, afib s/p DCCV (03/2019), CHF, severe mitral and tricuspid valve regurgitation and moderate aortic stenosis s/p mitral valve repair, tricuspid valve annuloplasty, aortic valve replacement and full right and left maze procedure 8/16.  Epinephrine at 16, dobutamine now at 5, vasopressin at 0.04 Remains on CRRT  Pt remains lethargic but arousable; remains very weak. RD asked pt if he could hold out his arms in front of him and pt to weak to really lift arms up. Pt keeps his eyes closed, he will attempt to open on command but eye lids usually half open and does not keep eyes open for long.   TF remains on hold. Nausea continues Abd xray with stable nonspecific colonic distention, Cortrak  in gastric antrum  Pt is currently on 100 mg IV Thiamine, RD remains concerned that pt may have severe thiamine deficiency, ?beriberi. Pt at risk due to long term losses via dialysis (first transplant 30 years ago) and severe malnutrition with presumed chronic poor oral intake. Pt symptoms include weakness in limbs and extremities, digestive issues and loss of appetite and general lassitude, heart failure (including high output heart failure, dilated CM), lethargy. It affects the heart and CNS. Plan to advocate for high dose thiamine treatment for him as possible benefit greatly outweighs any risks.   Micronutrient Labs:  CRP: 7.9 (H) Ceruloplasmin 36.5 (H) Copper: 131 (wdl) Folate, serum: 3.6 (L) 06/26/23 Iron, serum: 121(wdl) Thiamine: pending Vitamin B12:  1175 (H) 06/26/23 Vitamin B6: 21.6 (wdl) Vitamin C: pending Zinc: 95 (wdl)  Labs: sodium 131 (L), potassium 4.4 (wdl), phosphorus 4.6 (wdl), magnesium 2.8 (H), corrected calcium 11.7 (H)-albumin 2.4 (L) Meds: ss novolog, Rena-vite BID, midodrine, IV folic acid 1mg , IV thiamine 100 mg,    Diet Order:   Diet Order             Diet NPO time specified  Diet effective now                   EDUCATION NEEDS:   Not appropriate for education at this time  Skin:  Skin Assessment: Skin Integrity Issues: Skin Integrity Issues:: Incisions Incisions: sternal  Last BM:  type 7 stool via rectal tube  Height:   Ht Readings from Last 1 Encounters:  07/04/23 6\' 3"  (1.905 m)    Weight:   Wt  Readings from Last 1 Encounters:  07/06/23 82.2 kg    Ideal Body Weight:  89 kg  BMI:  Body mass index is 22.65 kg/m.  Estimated Nutritional Needs:   Kcal:  2400-2600 kcals  Protein:  155-190 g  Fluid:  1L plus UOP  [name] MS, RDN, LDN, CNSC Registered Dietitian 3 Clinical Nutrition RD Pager and On-Call Pager Number Located in [place]

## 2023-07-06 NOTE — Progress Notes (Signed)
OT Cancellation Note  Patient Details Name: [name] MRN: [id number] DOB: 12-Oct-1971   Cancelled Treatment:    Reason Eval/Treat Not Completed: Active bedrest order.  Not appropriate for EOB or OOB, completed bed level there-x with PT.  OT will continue as appropriate.    Kealani Leckey D Shanti Eichel 07/06/2023, 1:25 PM 07/06/2023  RP, OTR/L  Acute Rehabilitation Services  Office:  [phone]

## 2023-07-06 NOTE — Progress Notes (Signed)
NAME:  [name], MRN:  [id number], DOB:  [birth year], LOS: 14 ADMISSION DATE:  06/22/2023, CONSULTATION DATE: 06/22/2023 REFERRING MD: Leafy Ro - TCTS, CHIEF COMPLAINT: Postcardiac surgery  History of Present Illness:  This is a 52 year old gentleman, past medical history of anxiety depression, bipolar disease, congestive heart failure chronic diastolic heart failure kidney disease followed by renal transplant in 08-01-98 repeat renal transplant in 01-Aug-2014.  Patient underwent surgery today for severe mitral regurgitation severe tricuspid regurgitation and moderate aortic stenosis.  He also has atrial fibrillation received maze procedure.  He had a complex mitral valve repair a tricuspid valve annuloplasty and aortic valve replacement followed by full right and left maze procedure.  Pertinent Medical History:   Past Medical History:  Diagnosis Date   ADHD (attention deficit hyperactivity disorder)    Anemia    Anemia, chronic disease 07/13/2011   Anxiety    Arthritis    Bipolar 2 disorder (HCC) 08/18/2011   Diagnosed in 01-Aug-2010.    Bipolar disorder (HCC)    Blood dyscrasia    Nephrotic Anemia   Blood transfusion    Blood transfusion without reported diagnosis    Chronic diastolic CHF (congestive heart failure) (HCC)    Complication of anesthesia    Woke up intubated gets combative  afraid to be alone    Congestive heart failure (CHF) (HCC) 07/13/2011   Onset August 01, 2005.  Followed by Dr. Eden Emms.  S/p cardiac catheterization in [facility] Dr. Anne Fu..  S/p cardiac catheterization in 2013/08/01 by Nishan.  Echo 2013/08/01.    Depression    Dysrhythmia    A. Fib 02-Aug-2023 with DCCV in May 2024   Erectile dysfunction    ESRD on hemodialysis (HCC) 07/13/2011   Glomerulonephritis at age 57, started HD in 1995-08-02.  Deceased donor renal transplant 08/01/98 at [facility], then kidney failed and went back on HD in 08-01-05.  L forearm AVF . s/p repeat renal transplant 10/2014 [facility].   GI bleed 11/01/2011   Rectal bleeding with  emesis/diarrhea    Heart murmur    Hemodialysis status (HCC)    Hydrocele, right 11/25/2014   Hypertension    Hypertensive emergency 01/06/2013   Hypoglycemia 01/11/2013   Influenza-like illness 12/18/2013   Mild ascending aorta dilatation (HCC)    NICM (nonischemic cardiomyopathy) (HCC)    Pericardial effusion    a. Mod by echo in 08/01/13, similar to prior.   Peripheral polyneuropathy 08/04/2016   Pneumonia    Pulmonary edema 01/06/2013   Pulmonary hypertension associated with end stage renal disease on dialysis [facility]) 07/13/2011   Renal transplant, status post 11/25/2014   Pt with Glomerulonephritis at age 60. Deceased donor renal transplant Aug 01, 1998 right and left renal transplant 10/2014.  Followed by [facility] transplant team; nephrologist is Dr. Lonna Cobb.    Respiratory failure with hypoxia (HCC) 01/06/2013   Shortness of breath 12/12/2012   Swollen testicle 07/13/2011   URI (upper respiratory infection) 01/25/2012    Significant Hospital Events: Including procedures, antibiotic start and stop dates in addition to other pertinent events   8/15 - AVR, MVR, TVR with TCTS (Weldner). Remains on multiple pressors (NE, Epi, vaso). 8/16 - LIJ Trialysis catheter placed for CRRT.  8/17 extubated  8/18 - remains on CVVHD, 3 pressors  8/19-8/20 improving pressor needs 8/21 agitated delirium; cortrak 8/26 CRRT restarted 8/27 TVP placed, brady overnight and pacing turned to 80  Interim History / Subjective:  Increased pressor requirement overnight.  Epi up to 20 Vaso up to .04  Remains on  dobutamine TVP dependent, underlying rate in the 30s per RN.    Objective:  Blood pressure (!) 91/28, pulse 82, temperature 98 F (36.7 C), temperature source Oral, resp. rate (!) 25, height 6\' 3"  (1.905 m), weight 82.2 kg, SpO2 99%. CVP:  [9 mmHg-21 mmHg] 13 mmHg      Intake/Output Summary (Last 24 hours) at 07/06/2023 0851 Last data filed at 07/06/2023 0800 Gross per 24 hour  Intake 2600.74  ml  Output 3869.5 ml  Net -1268.76 ml   Filed Weights   07/04/23 0500 07/05/23 0500 07/06/23 0500  Weight: 84.4 kg 80.6 kg 82.2 kg   Physical Examination:  General: Adult male in NAD Neuro: Somnolent, arouses to voice and answers questions appropriately.  HEENT: [place]/AT, PERRL, no JVD Cardiovascular: TVP at 82. 3/6 SEM.  Lungs: Respirations even and unlabored.  CTA bilaterally, No W/R/R. Abdomen: BS x 4, soft, NT/ND.  Musculoskeletal: No gross deformities, no edema.  Skin: Intact, warm, no rashes.  POCUS showing small effusion. Hyperdynamic LV. Severely dilated RV with flattening of the interventricular septum.   Assessment & Plan:   Mitral valve regurgitation, tricuspid regurgitation and aortic stenosis status post valvular repair x 3 Postoperative junctional rhythm/CHB  - now pacer dependent (s/p TVP placement 8/27), eventual need for PPM Shock: Postoperative distributive shock and vasoplegia likely resolved by now. RV failure, Possible sepsis now off ABX. Enlarging pericardial effusion need to rule out tamponade.  AoC HFrEF- intra-op TEE with EF 45%, RV mod-severely reduced Afib s/p MAZE - TCTS managing. - EP following. - TVP per EP - Epi, vaso, dobutamine - Continue Midodrine. - PT/OT as able.  Pericardial effusion - larger. - Cards discussing.  Possible PNA. - s/p Zosyn course  Right pleural effusion - s/p pigtail chest tube with subsequent removal 8/25. - Supportive care. - Push IS.  Acute renal failure: Hx CKD, worsened by hemodynamic insults.  History of kidney disease status post renal transplant (failed 1999 and 2015). - Nephrology following. - Continue CRRT, having to keep even today due to increased pressor requirements.  - Follow BMP.  ABLA, postoperative as expected.  No active bleeding at present. Thrombocytopenia, ?chronic- stable. - Transfuse for Hgb < 7. - Monitor for bleeding.  Postoperative encephalopathy, ICU delirium- now seems to have mostly  cleared.  Situational anxiety. - Continue Escitalopram, Seroquel at bedtime.  Moderate malnutrition. - TF on hold. Check KUB to ensure adequate tube placement.  - SLP following, now that he is a bit more alert he might do better with swallowing etc.  Possible gout flare. - Continue Colchicine.  Deconditioning. - PT/OT as able.   Best Practice (right click and "Reselect all SmartList Selections" daily)   Diet/type:TF on hold DVT prophylaxis:heparin ppx GI prophylaxis: PPI Lines: Central line Foley:  No  Code Status:  full code Last date of multidisciplinary goals of care discussion [Per Primary Team]   CC time: 38 min.   Joneen Roach, AGACNP-BC [facility]  See Amion for personal pager PCCM on call pager [phone] until 7pm. Please call Elink 7p-7a. [phone]  07/06/2023 11:08 AM

## 2023-07-06 NOTE — Progress Notes (Addendum)
      [address]       [name] [address]             [phone]       2 Days Post-Op Procedure(s) (LRB): TEMPORARY PACEMAKER (N/A)  Subjective:  Patient drowsy, confused this morning.  Per nursing nauseated with continued tube feeds and discomfort with medication administration through cortrak.  Objective: Vital signs in last 24 hours: Temp:  [97.5 F (36.4 C)-98 F (36.7 C)] 98 F (36.7 C) (08/29 0000) Pulse Rate:  [79-106] 82 (08/29 0700) Cardiac Rhythm: Ventricular paced (08/29 0000) Resp:  [14-31] 19 (08/29 0700) SpO2:  [93 %-100 %] 100 % (08/29 0700) Arterial Line BP: (76-122)/(18-59) 101/35 (08/29 0700) Weight:  [82.2 kg] 82.2 kg (08/29 0500)  Hemodynamic parameters for last 24 hours: CVP:  [9 mmHg-21 mmHg] 14 mmHg  Intake/Output from previous day: 08/28 0701 - 08/29 0700 In: 2652.6 [I.V.:1645.7; NG/GT:776.7; IV Piggyback:200.3] Out: 4123.3 [Stool:125]  General appearance: drowsy, confused Heart: regular rate and rhythm and + murmur, paced Lungs: diminished breath sounds bibasilar Abdomen: soft, non-tender; bowel sounds normal; no masses,  no organomegaly Extremities: extremities normal, atraumatic, no cyanosis or edema Wound: clean and dry  Lab Results: Recent Labs    07/05/23 0452 07/06/23 0455  WBC 11.4* 11.2*  HGB 7.7* 7.5*  HCT 26.1* 25.4*  PLT 150 170   BMET:  Recent Labs    07/05/23 1609 07/06/23 0455  NA 135 133*  K 4.9 4.6  CL 101 100  CO2 25 24  GLUCOSE 139* 130*  BUN 34* 29*  CREATININE 2.76* 2.76*  CALCIUM 10.3 10.5*    PT/INR: No results for input(s): "LABPROT", "INR" in the last 72 hours. ABG    Component Value Date/Time   PHART 7.305 (L) 06/28/2023 0756   HCO3 26.3 06/28/2023 0756   TCO2 28 06/28/2023 0756   ACIDBASEDEF 1.0 06/28/2023 0756   O2SAT 89.3 07/06/2023 0455   CBG (last 3)  Recent Labs    07/06/23 0041 07/06/23 0433 07/06/23 0723  GLUCAP 114* 120* 117*    Assessment/Plan: S/P  Procedure(s) (LRB): TEMPORARY PACEMAKER (N/A)  CV- CHB, temp pacer in place.. now on Epi @ 20, Vaso, and Dobutamine added yesterday in setting of RV dysfunction..., remains on Midodrine for pressure support AHF is following...  As discussed with Dr. Laneta Simmers yesterday will get EPW in place for now discuss with Dr. Leafy Ro management preference for these.. patient most certainly would not tolerate cardiac tamponade should bleeding develop post removal Pulm- not requiring oxygen, edema on last CXR repeat in AM ESRD-unable to undergo HD due to low BP and drip requirements.. he has working AV fistula.. he currently on CRRT, Nephrology following GI- NPO, Cortrak in place.. nausea and discomfort with meds through Cortak... currently giving a break with tube feeds.Marland Kitchen once resumed if complaints persist okay to get KUB to check Cortrak placement.. however in current state may ultimately benefit from J tube placement ID- afebrile, has completed ABX for septic shock component Anemia- chronic will transfused as needed Deconditioning- severe, continue PT/OT   LOS: 14 days    Lowella Dandy, PA-C 07/06/2023  Echo shows severe RV dysfunction Increased chemical support overnight Remains pacer dependent in complete heart block Unable to pull fluid on CRRT Will keep SBP > 100 Will keep pacing wires Will consult palliative to facilitate family discussions on goals of care.  Yoland Scherr Keane Scrape

## 2023-07-07 DIAGNOSIS — I5023 Acute on chronic systolic (congestive) heart failure: Secondary | ICD-10-CM | POA: Diagnosis not present

## 2023-07-07 DIAGNOSIS — Z8679 Personal history of other diseases of the circulatory system: Secondary | ICD-10-CM

## 2023-07-07 DIAGNOSIS — Z9889 Other specified postprocedural states: Secondary | ICD-10-CM | POA: Diagnosis not present

## 2023-07-07 DIAGNOSIS — Z7189 Other specified counseling: Secondary | ICD-10-CM | POA: Diagnosis not present

## 2023-07-07 DIAGNOSIS — R579 Shock, unspecified: Secondary | ICD-10-CM

## 2023-07-07 DIAGNOSIS — I442 Atrioventricular block, complete: Secondary | ICD-10-CM | POA: Diagnosis not present

## 2023-07-07 DIAGNOSIS — Z515 Encounter for palliative care: Secondary | ICD-10-CM

## 2023-07-07 DIAGNOSIS — I340 Nonrheumatic mitral (valve) insufficiency: Secondary | ICD-10-CM | POA: Diagnosis not present

## 2023-07-07 DIAGNOSIS — Z952 Presence of prosthetic heart valve: Secondary | ICD-10-CM

## 2023-07-07 DIAGNOSIS — I4891 Unspecified atrial fibrillation: Secondary | ICD-10-CM | POA: Diagnosis not present

## 2023-07-07 LAB — RENAL FUNCTION PANEL
Albumin: 2.4 g/dL — ABNORMAL LOW (ref 3.5–5.0)
Albumin: 2.4 g/dL — ABNORMAL LOW (ref 3.5–5.0)
Anion gap: 11 (ref 5–15)
Anion gap: 7 (ref 5–15)
BUN: 21 mg/dL — ABNORMAL HIGH (ref 6–20)
BUN: 23 mg/dL — ABNORMAL HIGH (ref 6–20)
CO2: 22 mmol/L (ref 22–32)
CO2: 25 mmol/L (ref 22–32)
Calcium: 10.7 mg/dL — ABNORMAL HIGH (ref 8.9–10.3)
Calcium: 10.7 mg/dL — ABNORMAL HIGH (ref 8.9–10.3)
Chloride: 99 mmol/L (ref 98–111)
Chloride: 102 mmol/L (ref 98–111)
Creatinine, Ser: 2.44 mg/dL — ABNORMAL HIGH (ref 0.61–1.24)
Creatinine, Ser: 2.41 mg/dL — ABNORMAL HIGH (ref 0.61–1.24)
GFR, Estimated: 31 mL/min — ABNORMAL LOW (ref >60)
GFR, Estimated: 32 mL/min — ABNORMAL LOW (ref >60)
Glucose, Bld: 119 mg/dL — ABNORMAL HIGH (ref 70–99)
Glucose, Bld: 125 mg/dL — ABNORMAL HIGH (ref 70–99)
Phosphorus: 4.5 mg/dL (ref 2.5–4.6)
Phosphorus: 3.9 mg/dL (ref 2.5–4.6)
Potassium: 4.3 mmol/L (ref 3.5–5.1)
Potassium: 4.1 mmol/L (ref 3.5–5.1)
Sodium: 132 mmol/L — ABNORMAL LOW (ref 135–145)
Sodium: 134 mmol/L — ABNORMAL LOW (ref 135–145)

## 2023-07-07 LAB — CBC
HCT: 25 % — ABNORMAL LOW (ref 39.0–52.0)
Hemoglobin: 7.3 g/dL — ABNORMAL LOW (ref 13.0–17.0)
MCH: 32.2 pg (ref 26.0–34.0)
MCHC: 29.2 g/dL — ABNORMAL LOW (ref 30.0–36.0)
MCV: 110.1 fL — ABNORMAL HIGH (ref 80.0–100.0)
Platelets: 184 10*3/uL (ref 150–400)
RBC: 2.27 MIL/uL — ABNORMAL LOW (ref 4.22–5.81)
RDW: 19.6 % — ABNORMAL HIGH (ref 11.5–15.5)
WBC: 8.3 10*3/uL (ref 4.0–10.5)
nRBC: 1.9 % — ABNORMAL HIGH (ref 0.0–0.2)

## 2023-07-07 LAB — COOXEMETRY PANEL
Carboxyhemoglobin: 2.8 % — ABNORMAL HIGH (ref 0.5–1.5)
Carboxyhemoglobin: 2.9 % — ABNORMAL HIGH (ref 0.5–1.5)
Methemoglobin: 1.7 % — ABNORMAL HIGH (ref 0.0–1.5)
Methemoglobin: 1.4 % (ref 0.0–1.5)
O2 Saturation: 92.8 %
O2 Saturation: 84.6 %
Total hemoglobin: 8 g/dL — ABNORMAL LOW (ref 12.0–16.0)
Total hemoglobin: 8.6 g/dL — ABNORMAL LOW (ref 12.0–16.0)

## 2023-07-07 LAB — MAGNESIUM: Magnesium: 2.7 mg/dL — ABNORMAL HIGH (ref 1.7–2.4)

## 2023-07-07 LAB — GLUCOSE, CAPILLARY
Glucose-Capillary: 105 mg/dL — ABNORMAL HIGH (ref 70–99)
Glucose-Capillary: 111 mg/dL — ABNORMAL HIGH (ref 70–99)
Glucose-Capillary: 112 mg/dL — ABNORMAL HIGH (ref 70–99)
Glucose-Capillary: 114 mg/dL — ABNORMAL HIGH (ref 70–99)
Glucose-Capillary: 118 mg/dL — ABNORMAL HIGH (ref 70–99)

## 2023-07-07 LAB — LACTIC ACID, PLASMA: Lactic Acid, Venous: 1 mmol/L (ref 0.5–1.9)

## 2023-07-07 MED ORDER — PROSOURCE TF20 ENFIT COMPATIBL EN LIQD
60.0000 mL | ENTERAL | Status: DC
  Administered 2023-07-07 – 2023-07-10 (×20): 60 mL
  Filled 2023-07-07 (×21): qty 60

## 2023-07-07 MED ORDER — NOREPINEPHRINE 16 MG/250ML-% IV SOLN
0.0000 ug/min | INTRAVENOUS | Status: DC
  Administered 2023-07-07: 2 ug/min via INTRAVENOUS
  Administered 2023-07-09: 13.013 ug/min via INTRAVENOUS
  Administered 2023-07-10: 11 ug/min via INTRAVENOUS
  Administered 2023-07-11: 7 ug/min via INTRAVENOUS
  Filled 2023-07-07 (×5): qty 250

## 2023-07-07 MED ORDER — THIAMINE HCL 100 MG/ML IJ SOLN
100.0000 mg | Freq: Every day | INTRAMUSCULAR | Status: DC
  Administered 2023-07-09 – 2023-07-11 (×3): 100 mg via INTRAVENOUS
  Filled 2023-07-07 (×3): qty 2

## 2023-07-07 MED ORDER — PRISMASOL BGK 4/2.5 32-4-2.5 MEQ/L EC SOLN: Status: DC

## 2023-07-07 MED ORDER — VITAL 1.5 CAL PO LIQD
1000.0000 mL | ORAL | Status: DC
  Administered 2023-07-07: 1000 mL

## 2023-07-07 MED ORDER — NOREPINEPHRINE 4 MG/250ML-% IV SOLN
INTRAVENOUS | Status: AC
  Filled 2023-07-07: qty 250

## 2023-07-07 MED ORDER — SIMETHICONE 40 MG/0.6ML PO SUSP
40.0000 mg | Freq: Four times a day (QID) | ORAL | Status: DC | PRN
  Administered 2023-07-07 – 2023-07-10 (×4): 40 mg via ORAL
  Filled 2023-07-07 (×5): qty 0.6

## 2023-07-07 MED ORDER — POLYETHYLENE GLYCOL 3350 17 G PO PACK
17.0000 g | PACK | Freq: Every day | ORAL | Status: DC
  Administered 2023-07-07 – 2023-07-13 (×7): 17 g
  Filled 2023-07-07 (×6): qty 1

## 2023-07-07 MED ORDER — METOCLOPRAMIDE HCL 5 MG/ML IJ SOLN
5.0000 mg | Freq: Three times a day (TID) | INTRAMUSCULAR | Status: DC
  Administered 2023-07-07 – 2023-07-11 (×14): 5 mg via INTRAVENOUS
  Filled 2023-07-07 (×14): qty 2

## 2023-07-07 MED ORDER — VITAL HIGH PROTEIN PO LIQD: 1000.0000 mL | ORAL | Status: DC

## 2023-07-07 NOTE — Procedures (Signed)
Arterial Catheter Insertion Procedure Note  [name]  [id number]  06/04/1971  Date:07/07/23  Time:3:52 PM    Provider Performing: Cheri Fowler    Procedure: Insertion of Arterial Line (40981) with US guidance (19147)   Indication(s) Blood pressure monitoring and/or need for frequent ABGs  Consent Risks of the procedure as well as the alternatives and risks of each were explained to the patient and/or caregiver.  Consent for the procedure was obtained and is signed in the bedside chart  Anesthesia None   Time Out Verified patient identification, verified procedure, site/side was marked, verified correct patient position, special equipment/implants available, medications/allergies/relevant history reviewed, required imaging and test results available.   Sterile Technique Maximal sterile technique including full sterile barrier drape, hand hygiene, sterile gown, sterile gloves, mask, hair covering, sterile ultrasound probe cover (if used).   Procedure Description Area of catheter insertion was cleaned with chlorhexidine and draped in sterile fashion. With real-time ultrasound guidance an arterial catheter was placed into the left  Axillary  artery.  Appropriate arterial tracings confirmed on monitor.     Complications/Tolerance None; patient tolerated the procedure well.   EBL Minimal   Specimen(s) None

## 2023-07-07 NOTE — Progress Notes (Signed)
NAME:  Terrie Kollin, MRN:  [id number], DOB:  [birth year], LOS: 15 ADMISSION DATE:  06/22/2023, CONSULTATION DATE: 06/22/2023 REFERRING MD: Leafy Ro - TCTS, CHIEF COMPLAINT: Postcardiac surgery  History of Present Illness:  This is a 52 year old gentleman, past medical history of anxiety depression, bipolar disease, congestive heart failure chronic diastolic heart failure kidney disease followed by renal transplant in 1998/07/22 repeat renal transplant in 07/22/14.  Patient underwent surgery today for severe mitral regurgitation severe tricuspid regurgitation and moderate aortic stenosis.  He also has atrial fibrillation received maze procedure.  He had a complex mitral valve repair a tricuspid valve annuloplasty and aortic valve replacement followed by full right and left maze procedure.  Pertinent Medical History:   Past Medical History:  Diagnosis Date   ADHD (attention deficit hyperactivity disorder)    Anemia    Anemia, chronic disease 07/13/2011   Anxiety    Arthritis    Bipolar 2 disorder (HCC) 08/18/2011   Diagnosed in 07-22-10.    Bipolar disorder (HCC)    Blood dyscrasia    Nephrotic Anemia   Blood transfusion    Blood transfusion without reported diagnosis    Chronic diastolic CHF (congestive heart failure) (HCC)    Complication of anesthesia    Woke up intubated gets combative  afraid to be alone    Congestive heart failure (CHF) (HCC) 07/13/2011   Onset 07-22-05.  Followed by Dr. Eden Emms.  S/p cardiac catheterization in [facility] Dr. Anne Fu..  S/p cardiac catheterization in Jul 22, 2013 by Nishan.  Echo 07-22-2013.    Depression    Dysrhythmia    A. Fib 07-23-2023 with DCCV in May 2024   Erectile dysfunction    ESRD on hemodialysis (HCC) 07/13/2011   Glomerulonephritis at age 56, started HD in 1995-07-23.  Deceased donor renal transplant Jul 22, [facility], then kidney failed and went back on HD in 07/22/2005.  L forearm AVF . s/p repeat renal transplant 10/2014 [facility].   GI bleed 11/01/2011   Rectal bleeding with  emesis/diarrhea    Heart murmur    Hemodialysis status (HCC)    Hydrocele, right 11/25/2014   Hypertension    Hypertensive emergency 01/06/2013   Hypoglycemia 01/11/2013   Influenza-like illness 12/18/2013   Mild ascending aorta dilatation (HCC)    NICM (nonischemic cardiomyopathy) (HCC)    Pericardial effusion    a. Mod by echo in 2013-07-22, similar to prior.   Peripheral polyneuropathy 08/04/2016   Pneumonia    Pulmonary edema 01/06/2013   Pulmonary hypertension associated with end stage renal disease on dialysis [facility]) 07/13/2011   Renal transplant, status post 11/25/2014   Pt with Glomerulonephritis at age 54. Deceased donor renal transplant 07/22/1998 right and left renal transplant 10/2014.  Followed by [facility] transplant team; nephrologist is Dr. Lonna Cobb.    Respiratory failure with hypoxia (HCC) 01/06/2013   Shortness of breath 12/12/2012   Swollen testicle 07/13/2011   URI (upper respiratory infection) 01/25/2012    Significant Hospital Events: Including procedures, antibiotic start and stop dates in addition to other pertinent events   8/15 - AVR, MVR, TVR with TCTS (Weldner). Remains on multiple pressors (NE, Epi, vaso). 8/16 - LIJ Trialysis catheter placed for CRRT.  8/17 extubated  8/18 - remains on CVVHD, 3 pressors  8/19-8/20 improving pressor needs 8/21 agitated delirium; cortrak 8/26 CRRT restarted 8/27 TVP placed, brady overnight and pacing turned to 80  Interim History / Subjective:  Having some epigastric discomfort reproducible with palpation Net even Remains pacer dependent  Gtt: epi 15,  dobut 5, levo 5, vaso 0.04  Objective:  Blood pressure (!) 91/28, pulse 80, temperature 98.8 F (37.1 C), temperature source Oral, resp. rate (!) 25, height 6\' 3"  (1.905 m), weight 80.2 kg, SpO2 93%. CVP:  [12 mmHg-20 mmHg] 14 mmHg      Intake/Output Summary (Last 24 hours) at 07/07/2023 0840 Last data filed at 07/07/2023 0800 Gross per 24 hour  Intake 2272.65 ml   Output 2085.2 ml  Net 187.45 ml   Filed Weights   07/05/23 0500 07/06/23 0500 07/07/23 0500  Weight: 80.6 kg 82.2 kg 80.2 kg   Physical Examination:  No distress More awake than yesterday MM dry, trachea midline Ext warm Moves to command but weak RASS 0 Abd soft, some mild epigastric discomfort Rectal tube in place with loose stool  Patient Lines/Drains/Airways Status     Active Line/Drains/Airways     Name Placement date Placement time Site Days   Arterial Line 06/22/23 Left Brachial 06/22/23  0700  Brachial  15   CVC Triple Lumen 07/03/23 Right Subclavian 07/03/23  1025  -- 4   Vascular Access Left Forearm Arteriovenous fistula --  --  Forearm  --   Fistula / Graft Right Upper arm Arteriovenous fistula 06/22/23  1900  Upper arm  15   Hemodialysis Catheter Left Internal jugular 06/26/23  0700  Internal jugular  11   Sheath 07/04/23 Right Internal Jugular;Venous 07/04/23  1105  Internal Jugular;Venous  3   Fecal Management System 40 mL 07/03/23  1300  -- 4   Incision (Closed) 06/22/23 Chest Other (Comment) 06/22/23  1107  -- 15   Small Bore Feeding Tube Left nare Marking at nare/corner of mouth 70 cm 06/28/23  1446  Left nare  9   Wound / Incision (Open or Dehisced) 06/26/23 Other (Comment) Back Lateral;Right pigtail chest tube catheter incision placed by CCM 06/26/23  1615  Back  11           KUB noted: looks more like some small bowel dilation Resolved issues:   Possible PNA. - s/p Zosyn course  Right pleural effusion - s/p pigtail chest tube with subsequent removal 8/25. - Supportive care. - Push IS.  Assessment & Plan:   Mitral valve regurgitation, tricuspid regurgitation and aortic stenosis status post valvular repair x 3 Postoperative junctional rhythm/CHB  - now pacer dependent (s/p TVP placement 8/27), eventual need for PPM Shock: Postoperative distributive shock and vasoplegia likely resolved by now. RV failure, Possible sepsis now off ABX. Enlarging  pericardial effusion need to rule out tamponade.  AoC HFrEF- intra-op TEE with EF 45%, RV mod-severely reduced Afib s/p MAZE - TCTS managing. - EP following. - TVP per EP - Epi, vaso, dobutamine, systolic 95 goal - Continue Midodrine. - PT/OT as able. - Not really making much progress here unfortunately, ?try closing off fistula to see what happens, otherwise may be at EOL; will talk with VVS  Pericardial effusion - larger but no real tamponade physiology; hard to get to and don't think it will affect hemodynamics  Acute renal failure: Hx CKD, worsened by hemodynamic insults.  History of kidney disease status post renal transplant (failed 1999 and 2015). - Nephrology following. - Continue CRRT - Follow BMP.  ABLA, postoperative as expected.  No active bleeding at present. Thrombocytopenia, ?chronic- stable. - Transfuse for Hgb < 7. - Monitor for bleeding.  Postoperative encephalopathy, ICU delirium- now seems to have mostly cleared.  Situational anxiety. - Continue Escitalopram, Seroquel at bedtime.  Moderate malnutrition,  epigastric discomfort - Reglan and retrial trickle feeds, check lactate, query intestinal ischemia - Empiric thiamine for ?Wernicke or beriberi  Possible gout flare. - Continue Colchicine.  Deconditioning. - PT/OT as able.   Best Practice (right click and "Reselect all SmartList Selections" daily)   Diet/type:TF on hold DVT prophylaxis:heparin ppx GI prophylaxis: PPI Lines: Central line Foley:  No  Code Status:  full code Last date of multidisciplinary goals of care discussion [Per Primary Team]   39 min cc time Myrla Halsted 07/07/2023 8:40 AM

## 2023-07-07 NOTE — Progress Notes (Addendum)
Advanced Heart Failure Rounding Note  PCP-Cardiologist: Reatha Harps, MD   Subjective:   8/15: Elective triple vale surgery + MAZE. Underwent aortic valve replacement + mitral and tricuspid valve repairs by Dr. Milinda Antis 8/22: AHF consulted for high-output HF with persistent post-op shock 8/25: CRRT stopped 8/26: CRRT restarted  08/27: S/p TTVP Echo: EF 70-75%, severe LVH, RV mod reduced, RV severely enlarged. LA/RA mod dilated. Large pericardial effusion. No evidence of tamponade.   Became hypotensive overnight and NE started.  Currently on 15 Epi + 5 NE + 0.04 Vaso + 5 DBA. CO-OX 93% (poss sepsis + AV fistula).  Able to run even on CRRT overnight one NE added. CVP 14 this am.  TF on hold d/t severe nausea. Lethargic this am. Too weak to use extremities. Reports chest pain.     Objective:    Weight Range: 80.2 kg Body mass index is 22.1 kg/m.   Vital Signs:   Temp:  [98 F (36.7 C)-98.7 F (37.1 C)] 98.7 F (37.1 C) (08/30 0400) Pulse Rate:  [80-88] 80 (08/30 0700) Resp:  [8-26] 22 (08/30 0700) SpO2:  [87 %-100 %] 98 % (08/30 0700) Arterial Line BP: (74-129)/(25-60) 101/33 (08/30 0700) Weight:  [80.2 kg] 80.2 kg (08/30 0500) Last BM Date : 07/06/23  Weight change: Filed Weights   07/05/23 0500 07/06/23 0500 07/07/23 0500  Weight: 80.6 kg 82.2 kg 80.2 kg   Intake/Output:   Intake/Output Summary (Last 24 hours) at 07/07/2023 0734 Last data filed at 07/07/2023 0700 Gross per 24 hour  Intake 2301.31 ml  Output 2033 ml  Net 268.31 ml    Physical Exam  General:  Ill appearing HEENT: + CorTrak Neck: supple. L internal jugular HD cath Cor: PMI nondisplaced. Regular rate & rhythm. No rubs, gallops or murmurs. Lungs: clear Abdomen: soft, nontender, nondistended.  Extremities: no cyanosis, clubbing, rash, edema Neuro: Lethargic but follows commands.    Telemetry   V paced 82, short runs NSVT up to 6 beats   Labs    CBC Recent Labs     07/06/23 0455 07/07/23 0440  WBC 11.2* 8.3  HGB 7.5* 7.3*  HCT 25.4* 25.0*  MCV 109.0* 110.1*  PLT 170 184   Basic Metabolic Panel Recent Labs    16/10/96 0455 07/06/23 1513 07/07/23 0440  NA 133* 131* 132*  K 4.6 4.4 4.3  CL 100 99 99  CO2 24 25 22   GLUCOSE 130* 113* 119*  BUN 29* 25* 21*  CREATININE 2.76* 2.51* 2.44*  CALCIUM 10.5* 10.4* 10.7*  MG 2.8*  --  2.7*  PHOS 5.0* 4.6 4.5   Liver Function Tests Recent Labs    07/05/23 0452 07/05/23 1609 07/06/23 1513 07/07/23 0440  AST 46*  --   --   --   ALT 13  --   --   --   ALKPHOS 78  --   --   --   BILITOT 1.0  --   --   --   PROT 8.1  --   --   --   ALBUMIN 2.4*  2.4*   < > 2.4* 2.4*   < > = values in this interval not displayed.   No results for input(s): "LIPASE", "AMYLASE" in the last 72 hours. Cardiac Enzymes No results for input(s): "CKTOTAL", "CKMB", "CKMBINDEX", "TROPONINI" in the last 72 hours.  BNP: BNP (last 3 results) No results for input(s): "BNP" in the last 8760 hours.  ProBNP (last 3 results) No results  for input(s): "PROBNP" in the last 8760 hours.   D-Dimer No results for input(s): "DDIMER" in the last 72 hours. Hemoglobin A1C No results for input(s): "HGBA1C" in the last 72 hours. Fasting Lipid Panel No results for input(s): "CHOL", "HDL", "LDLCALC", "TRIG", "CHOLHDL", "LDLDIRECT" in the last 72 hours. Thyroid Function Tests No results for input(s): "TSH", "T4TOTAL", "T3FREE", "THYROIDAB" in the last 72 hours.  Invalid input(s): "FREET3"  Other results:   Imaging   DG Abd 1 View  Result Date: 07/06/2023 CLINICAL DATA:  Enteric catheter placement EXAM: ABDOMEN - 1 VIEW COMPARISON:  06/30/2023 FINDINGS: Frontal view of the lower chest and upper abdomen demonstrates enteric catheter passing below diaphragm, tip projecting over the gastric antrum. Postsurgical changes are seen from aortic and mitral valve replacements. Single lead cardiac pacer tip overlies the right ventricle.  Epicardial pacing wires are seen. Stable nonspecific gaseous distention of the transverse colon. No evidence of small-bowel obstruction. Cardiac silhouette is enlarged, with continued patchy airspace disease compatible with edema. IMPRESSION: 1. Enteric catheter tip projecting over the gastric antrum. Electronically Signed   By: Sharlet Salina M.D.   On: 07/06/2023 14:47   ECHOCARDIOGRAM LIMITED  Result Date: 07/06/2023    ECHOCARDIOGRAM LIMITED REPORT   Patient Name:   [name] Date of Exam: 07/06/2023 Medical Rec #:  [id number]  Height:       75.0 in Accession #:    [id number] Weight:       181.2 lb Date of Birth:  02-19-1971  BSA:          2.104 m Patient Age:    52 years   BP:           104/38 mmHg Patient Gender: M          HR:           82 bpm. Exam Location:  Inpatient Procedure: Limited Echo, Color Doppler and Cardiac Doppler Indications:    I31.3 Pericardial effusion (noninflammatory)  History:        Patient has prior history of Echocardiogram examinations, most                 recent 07/04/2023. Arrythmias:Atrial Flutter; Risk                 Factors:Hypertension. Cardiac surgery on 06/22/23 with 30mm TV                 Ring, 30mm MV Band, 27mm AVR Inspiris Resilia.  Sonographer:    Irving Burton Senior RDCS Referring Phys: [phone] [facility] NICOLE [facility]  Sonographer Comments: Limited to recheck pericardial effusion IMPRESSIONS  1. Moderate pericardial effusion. The pericardial effusion is lateral to the left ventricle and surrounding the apex.  2. Left ventricular ejection fraction, by estimation, is 65 to 70%. The left ventricle has normal function.  3. Right ventricular systolic function is moderately reduced. The right ventricular size is moderately enlarged.  4. The mitral valve has been repaired/replaced.  5. The tricuspid valve is has been repaired/replaced.  6. The aortic valve has been repaired/replaced.  7. The inferior vena cava - dilated IVC, no inspiratory collapse, RA pressure 20 mmHg. Comparison(s):  Pericardial effusion may be slightly less than prior exam. Valves not evaluated. FINDINGS  Left Ventricle: Left ventricular ejection fraction, by estimation, is 65 to 70%. The left ventricle has normal function. Right Ventricle: The right ventricular size is moderately enlarged. Right ventricular systolic function is moderately reduced. Pericardium: A moderately sized pericardial effusion is present. The pericardial  effusion is lateral to the left ventricle and surrounding the apex. Mitral Valve: The mitral valve has been repaired/replaced. Tricuspid Valve: The tricuspid valve is has been repaired/replaced. Aortic Valve: The aortic valve has been repaired/replaced. Venous: The inferior vena cava - dilated IVC, no inspiratory collapse, RA pressure 20 mmHg. Additional Comments: Spectral Doppler performed. Color Doppler performed.  Weston Brass MD Electronically signed by Weston Brass MD Signature Date/Time: 07/06/2023/1:24:33 PM    Final     Medications:     Scheduled Medications:  acyclovir  200 mg Per Tube BID   aspirin  81 mg Per Tube Daily   atorvastatin  10 mg Per Tube QHS   Chlorhexidine Gluconate Cloth  6 each Topical Daily   colchicine  0.3 mg Per Tube Once every 5 days   [START ON 07/11/2023] darbepoetin (ARANESP) injection - DIALYSIS  150 mcg Subcutaneous Q Tue-1800   docusate  100 mg Per Tube BID   escitalopram  10 mg Per Tube Daily   feeding supplement (PROSource TF20)  60 mL Per Tube Daily   folic acid  1 mg Intravenous Daily   guaiFENesin  15 mL Per Tube BID   heparin injection (subcutaneous)  5,000 Units Subcutaneous Q8H   insulin aspart  0-24 Units Subcutaneous Q4H   lidocaine-prilocaine  1 Application Topical Q M,W,F   midodrine  15 mg Per Tube TID   multivitamin  1 tablet Per Tube BID   mouth rinse  15 mL Mouth Rinse 4 times per day   pantoprazole (PROTONIX) IV  40 mg Intravenous Q12H   QUEtiapine  25 mg Per Tube QHS   sodium chloride flush  3 mL Intravenous Q12H   [START  ON 07/09/2023] thiamine  100 mg Per Tube Daily    Infusions:   prismasol BGK 4/2.5 400 mL/hr at 07/07/23 0508    prismasol BGK 4/2.5 400 mL/hr at 07/07/23 4098   sodium chloride Stopped (07/06/23 1739)   sodium chloride 10 mL/hr at 07/07/23 0700   albumin human Stopped (06/23/23 0040)   DOBUTamine 5 mcg/kg/min (07/07/23 0700)   epinephrine 15 mcg/min (07/07/23 0700)   norepinephrine (LEVOPHED) Adult infusion 5 mcg/min (07/07/23 0700)   norepinephrine     prismasol BGK 4/2.5     thiamine (VITAMIN B1) injection Stopped (07/06/23 2246)   vasopressin 0.04 Units/min (07/07/23 0700)    PRN Medications: sodium chloride, sodium chloride, albumin human, bisacodyl **OR** bisacodyl, clonazePAM, heparin, norepinephrine, ondansetron (ZOFRAN) IV, mouth rinse, oxyCODONE, phenol, polyethylene glycol, sodium chloride flush, traMADol  Patient Profile   52 y/o AAM w/ mildly reduced systolic heart failure (EF once 45-50% in 2013 but normal on subsequent echos) valvular heart disease including severe AS, 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 EF 55-60% w/ mild RV dysfunction by echo in 6/24.    Admitted for elective triple vale surgery + MAZE. S/p bioprosthetic aortic valve replacement + mitral and tricuspid valve repairs. Post-op course c/b persistent vasoplagic syndrome.   Assessment/Plan  1. Post-op Vasopalegia  - Echo 06/29/23 EF 60-65%, no RWMA, small pericardial effusion present, MV repaired/replaced, trivial MR. TR repaired/replaced, mild TR. AV repaired/replaced.  - Echo 07/04/23: EF 70-75%, severe concentric LVH, RV mod reduced, RV severely enlarged. LA/RA mod dilated. Large pericardial effusion. No evidence of tamponade - Echo 08/29: Moderate pericardial effusion, no tamponade - Suspected high output HF 2/2 AV fistula  -  POD #14, remains dependent on  multiple pressors - No overt signs of infection/ possible sepsis other than co-ox being high. Afebrile. WBCs 8.3K. - PCT elevated but in setting of ESRD. Completed Zosyn for possible septic component - CVP 13-14. Targeting CVP 12-13 d/t severe RV failure   2.  Acute on Chronic Systolic Heart Failure w/ Prominent RV Dysfunction  - LVEF once 45-50% in 2013 but normal on subsequent echos. Pre-op TTE 6/25 EF 55-60%, GIIIDD (Restrictive) RV mildly reduced, mod PH w/ estimated RVSP 48 mmHg  - Intra-op TEE (while on 4 mcg of Epi) LVEF 45%, RV mod-severely reduced, stable AV prosthesis w/ normal gradient and stable valvuloplasty rings w/ mod residual MS, mean gradient 7 mmgH.   - echo 07/04/23: EF 70-75%, severe concentric LVH, RV mod reduced, RV severely enlarged. LA/RA mod dilated. Large pericardial effusion. No evidence of tamponade.  - Echo 08/29: EF 65-70%, RV moderately reduced, pericardial effusion slightly smaller, no tamponade - Midodrine 15 mg TID - Inotropes/pressors and volume management as above   3. 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 Post-op CHB  - EP following. Will need PPM if/when recovered   - S/p RIJ temp wire 08/27 - EPW left in place d/t concern for risk of tamponade   5. 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 Nephrology following   6. H/o Atrial Fibrillation  - S/p MAZE    7. Thrombocytopenia  Anemia - Thrombocytopenia resolved - Hgb trended down slowly to 7.3 today. Transfuse per primary team  8. ID: ?sepsis component  - Completed Zosyn per CCM.  - PCT elevated in setting of ESRD  9. Large pericardial effusion - seen on echo 07/04/23. Previously small on echo 06/29/23 - no evidence of tamponade - Limited echo 08/29  stable   We are becoming increasingly concerned that we are not going to have RV recovery. Pressor requirements have been increasing each day and he had trouble tolerating CVVH overnight d/t hypotension. He is severely weak with MSOF.  Palliative Care has been consulted for goals of care discussion.  Length of Stay: [address] LINDSAY N, PA-C  07/07/2023, 7:34 AM  Advanced Heart Failure Team Pager [phone] (M-F; 7a - 5p)  Please contact [facility] for night-coverage after hours (5p -7a ) and weekends on [email or web address]  Patient seen with PA, agree with the above note.   He has been running even on CVVH, CVP 13-14 (I/Os even for the last day).  He is on epinephrine 15, NE 5, vasopressin 0.04, dobutamine 5, midodrine 15 tid. SBP 100s currently. He is RV paced.    He is awake, complains of coughing.   General: Frail Neck: JVP 14 cm, no thyromegaly or thyroid nodule.  Lungs: Clear to auscultation bilaterally with normal respiratory effort. CV: Nondisplaced PMI.  Heart regular S1/S2, no S3/S4, 2/6 SEM RUSB.  1+ ankle edema.  Abdomen: Soft, nontender, no hepatosplenomegaly, no distention.  Skin: Intact without lesions or rashes.  Neurologic: Alert and oriented x 3.  Psych: Normal affect. Extremities: No clubbing or cyanosis.  HEENT: Normal.   Still pressor-dependent, on epinephrine 15 + vasopressin 0.4 + NE 5 + dobutamine 2.5.  Pressors were increased overnight again.  Upon review of  last echo, I suspect that this is primarily due to RV failure though sepsis may have been playing a role.  LV was hyperdynamic, RV was severely dilated and dysfunctional.  There is a mod-large pericardial effusion but no tamponade.  There has also been concern for component of high output HF from his fistula, co-ox consistently elevated.   - Continue current pressors, wean for SBP > 95.  - With severe RV dysfunction, will aim for CVP around 12-13.  Run CVVH even to gently negative today.  - I am increasingly  worried that he is not going to have RV recovery, he has had a long course at this point with no improvement.  Have asked palliative care to see him.  - Vascular to see for consideration of fistula ligation. I suspect that this is not going to be enough to restore his RV function.  Will wait for palliative care discussion and see how aggressive we are going to be.  If we want to try this, can take to cath lab and transiently occluded fistula during RHC to assess hemodynamic response.     He has completed Zosyn for possible contribution of septic shock.  Procalcitonin has been high but not sure how helpful with ESRD. He is afebrile.    Has complete heart block with rate in 50s.  Epicardial leads were only intermittently capturing.  Now with TTVP functioning appropriately.   CRITICAL CARE Performed by: Marca Ancona  Total critical care time: 40 minutes  Critical care time was exclusive of separately billable procedures and treating other patients.  Critical care was necessary to treat or prevent imminent or life-threatening deterioration.  Critical care was time spent personally by me on the following activities: development of treatment plan with patient and/or surrogate as well as nursing, discussions with consultants, evaluation of patient's response to treatment, examination of patient, obtaining history from patient or surrogate, ordering and performing treatments and interventions, ordering and review of laboratory studies, ordering and review of radiographic studies, pulse oximetry and re-evaluation of patient's condition.  Marca Ancona 07/07/2023 9:50 AM

## 2023-07-07 NOTE — Progress Notes (Signed)
    Mr. [name] is a critically ill 52 year old male s/p AVR, MV repair, MAZE.  Plans noted for goals of care discussion with palliative and family.  Fistula occlusion study is pending.  Vascular will be available if AV fistula ligation is indicated and family is agreeable.  Emilie Rutter, PA-C [facility] [phone] 07/07/2023  10:02 AM

## 2023-07-07 NOTE — Progress Notes (Signed)
[facility] Progress Note  Subjective:  He remains on CRRT - seen and examined on CRRT and procedure supervised.  He had 1.8 liters UF over 8/29 with CRRT.  His UF was lowered to keep even as tolerated due to optimize hemodynamics and reduce pressor requirement.  CHF team following closely. Weights up and down a bit recently - 80.2 kg today.  Overnight he had hypotension and levo was added at 5 mcg/min which improved his epi requirement (epi was maxed at 20 mcg/min and is now at 17 mcg/min).  He continues on vasopressin at 0.04 and dobutamine.  Earlier in the evening had difficulty keeping even and was positive.  In the last couple of hours since addition of epi he has been kept even.  Review of systems:    States he has shortness of breath if he has to lie flat  No vomiting or diarrhea Mental status better   Vitals:   07/07/23 0415 07/07/23 0430 07/07/23 0445 07/07/23 0500  BP:      Pulse: 82 86 86 82  Resp: (!) 21 (!) 22 (!) 22 (!) 21  Temp:      TempSrc:      SpO2: 95% 100% 100% 96%  Weight:    80.2 kg  Height:        Physical Exam:      General adult male in bed in no acute distress - critically ill  HEENT normocephalic atraumatic extraocular movements intact sclera anicteric Neck supple trachea midline Lungs clear but reduced to auscultation bilaterally normal work of breathing at rest; on 3 liters nasal cannula   Heart S1S2 Abdomen soft nontender nondistended Extremities no edema  Psych normal mood and affect Neuro - resting and awakens more easily; answers questions much more readily today. Oriented to person and situation but two guesses on year.   Access RUE AVF with bruit and thrill; left internal jugular nontunneled dialysis catheter      OP HD: [place] triad MWF nocturnal 3h    F250     94kg   2K/2.5 Ca bath   RUE AVF    Summary: [name] is a/an 52 y.o. male with a past medical history notable for ESRD on HD admitted with severe MR and TR and  aortic stenosis s/p repair/replacement.    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  Per cardiothoracic surgery  # Complete heart block - post-op, per EP/ cardiology   # ESRD - pt was too hypotensive for hemodialysis post-op.  He is s/p CRRT 8/16 - 8/25.  Then was restarted on CRRT on 8/26 to optimize volume in the setting of continued pressor requirement  - Continue CRRT.  UF goal of keep even as tolerated.  Please contact nephrology for changes to UF goal.  Will reach out to CHF team to confirm their goals for UF as well  - transition to 4K dialysate and trend BID labs   # Distributive Shock postoperatively - Pressors per primary team.  On midodrine.  Fluid volume overload is improved.  Last weight charted as 80.2 from 07/07/23  # Pericardial effusion - per CT surgery and CHF.  We are optimizing volume status with CRRT as above.  Assessed as moderate last TTE   # Anemia of ESRD - transfuse as needed per primary team.  Concurrent thrombocytopenia improved.  ESA/Aranesp 100 mcg weekly on Tues was started back on 8/27.  Have increased to 150 mcg weekly on Tuesdays    # MBD ckd - hypercalcemia improved.  Note he was transitioned off of nepro after starting back on CRRT.  Phos acceptable.  No binders    # Vascular access - RUE AVF had been functioning well but have been unable to use due to shock and CRRT requirement.  Temporary dialysis catheter placed for CRRT.    Disposition - in ICU on CRRT   Recent Labs  Lab 07/06/23 0455 07/06/23 1513 07/07/23 0440  HGB 7.5*  --  7.3*  ALBUMIN 2.3* 2.4* 2.4*  CALCIUM 10.5* 10.4* 10.7*  PHOS 5.0* 4.6 4.5  CREATININE 2.76* 2.51* 2.44*  K 4.6 4.4 4.3   No results for input(s): "IRON", "TIBC", "FERRITIN" in the last 168 hours.  Inpatient medications:  acyclovir  200 mg Per Tube BID   aspirin  81 mg Per Tube Daily   atorvastatin  10 mg Per Tube QHS   Chlorhexidine Gluconate Cloth  6  each Topical Daily   colchicine  0.3 mg Per Tube Once every 5 days   [START ON 07/11/2023] darbepoetin (ARANESP) injection - DIALYSIS  150 mcg Subcutaneous Q Tue-1800   docusate  100 mg Per Tube BID   escitalopram  10 mg Per Tube Daily   feeding supplement (PROSource TF20)  60 mL Per Tube Daily   folic acid  1 mg Intravenous Daily   guaiFENesin  15 mL Per Tube BID   heparin injection (subcutaneous)  5,000 Units Subcutaneous Q8H   insulin aspart  0-24 Units Subcutaneous Q4H   lidocaine-prilocaine  1 Application Topical Q M,W,F   midodrine  15 mg Per Tube TID   multivitamin  1 tablet Per Tube BID   mouth rinse  15 mL Mouth Rinse 4 times per day   pantoprazole (PROTONIX) IV  40 mg Intravenous Q12H   QUEtiapine  25 mg Per Tube QHS   sodium chloride flush  3 mL Intravenous Q12H   [START ON 07/09/2023] thiamine  100 mg Per Tube Daily     prismasol BGK 4/2.5 400 mL/hr at 07/07/23 0508    prismasol BGK 4/2.5 400 mL/hr at 07/07/23 0508   sodium chloride Stopped (07/06/23 1739)   sodium chloride 10 mL/hr at 07/07/23 0500   albumin human Stopped (06/23/23 0040)   DOBUTamine 5 mcg/kg/min (07/07/23 0500)   epinephrine 19 mcg/min (07/07/23 0500)   norepinephrine (LEVOPHED) Adult infusion 5 mcg/min (07/07/23 0500)   norepinephrine     prismasol BGK 2/2.5 dialysis solution 1,800 mL/hr at 07/07/23 0455   thiamine (VITAMIN B1) injection Stopped (07/06/23 2246)   vasopressin 0.04 Units/min (07/07/23 0500)   sodium chloride, sodium chloride, albumin human, bisacodyl **OR** bisacodyl, clonazePAM, heparin, norepinephrine, ondansetron (ZOFRAN) IV, mouth rinse, oxyCODONE, phenol, polyethylene glycol, sodium chloride flush, traMADol     Estanislado Emms, MD 6:13 AM 07/07/2023

## 2023-07-07 NOTE — Progress Notes (Signed)
  Patient Name: [name] Date of Encounter: 07/07/2023  Primary Cardiologist: Reatha Harps, MD Electrophysiologist: Lanier Prude, MD  Interval Summary   Lethargic this am.  Continued to have increased pressor requirement overnight with hypotension and inability to even keep even on CVVHD.  Vital Signs    Vitals:   07/07/23 0615 07/07/23 0630 07/07/23 0645 07/07/23 0700  BP:      Pulse: 83 82 81 80  Resp: (!) 22 (!) 24 20 (!) 22  Temp:      TempSrc:      SpO2: 100% 99% 99% 98%  Weight:      Height:        Intake/Output Summary (Last 24 hours) at 07/07/2023 0748 Last data filed at 07/07/2023 0700 Gross per 24 hour  Intake 2301.31 ml  Output 2033 ml  Net 268.31 ml   Filed Weights   07/05/23 0500 07/06/23 0500 07/07/23 0500  Weight: 80.6 kg 82.2 kg 80.2 kg    Physical Exam    GEN- The patient is critically ill appearing, Lethargic.  Lungs- Diminished throughout Cardiac- Regular rate and rhythm GI- soft, NT, ND, + BS Extremities- no clubbing or cyanosis  Telemetry    V pacing at 80, today has escape in the 50s again, CHB (personally reviewed)  Hospital Course    52 y.o. male w/hx of Anxiety, depression, Bipolar disease, CHF, CKD s/p renal transplant 1999 -> redo 2015 -> ESRD/HD, and severe MR/severe TR/Moderate AS    Tricuspid Valve Repair, Mitral Valve Repair, Aortic Valve Replacement and MAZE 8/15    >> CHB pacing via epicardial wires Epicardial threshold increased 8/27 -> Temp wire placed RIJ  Assessment & Plan    CHB S/p AVR/TVr/MVr/MAZE/LAAC Epicardial wires with intermittent capture and worsening threshold Now s/p RIJ temp wire.  Escape in the 50s today, CHB Threshold remains stable @ <1.0.   ESRD Per primary team Now back on CVVHD, currently keeping even.    Sepsis of unclear etiology Shock With increased pressor support again overnight On dobutamine, Epi, Neo, and vaso.   EP will see as needed over weekend.  He is not currently  candidate for procedure.   For questions or updates, please contact [facility] Please consult [email or web address] for contact info under Cardiology/STEMI.  Signed, Graciella Freer, PA-C  07/07/2023, 7:48 AM

## 2023-07-07 NOTE — Progress Notes (Signed)
Patient ID: [name], male   DOB: 08-02-1971, 52 y.o.   MRN: [id number] [facility] Evening Rounds:  Hemodynamics more stable today on NE 6, epi 16, dobut 5, vaso 0.04    CVP 15, Co-ox 85%  Tolerating CRRT.  More alert   Tolerating tube feeds.

## 2023-07-07 NOTE — Consult Note (Signed)
Consultation Note Date: 07/07/2023   Patient Name: [name]  DOB: 03/08/1971  MRN: [id number]  Age / Sex: 52 y.o., male  PCP: Donne Anon, MD Referring Physician: Eugenio Hoes, MD  Reason for Consultation: Establishing goals of care  HPI/Patient Profile: 52 y.o. male  with past medical history of ESRD on HD 20 year s/p failed renal transplant in 1999 and 2015, moderate pulmonary HTN, severe MR, TR, and severe AS s/p repairs, anxiety, depression, bipolar disorder, CAD, afib s/p MAZE admitted on 06/22/2023 with fatigue and DOE due to valvular disease.   Patient underwent MVR/AVR/TVR 8/15 with post-op course complicated by distributive shock and vasospastic syndrome. He underwent temporary pacemaker placement on 8/27 for heart block and was on CRRT 8/16-8/25, then resumed on 8/26 for optimization of hemodynamics and reduction of pressor requirements. There is concern for AV fistula in the setting of high output HF.  PMT has been consulted to assist with goals of care conversation on 8/29. Initial request from TCTS was to defer initial visit to 9/2, however patient's pressor requirements then increased overnight and PMT was asked to see by AHF team on 8/30.  Clinical Assessment and Goals of Care:  I have reviewed medical records including EPIC notes, labs and imaging, discussed with RN, assessed the patient and then met at the bedside to discuss diagnosis prognosis, GOC, EOL wishes, disposition and options.  I introduced Palliative Medicine as specialized medical care for people living with serious illness. It focuses on providing relief from the symptoms and stress of a serious illness. The goal is to improve quality of life for both the patient and the family.  We discussed a brief life review of the patient and then focused on their current illness.   I attempted to elicit values and goals of care important to the patient.    Medical History Review and  Understanding:  Patient states he has no questions regarding his acute illness or current care plan.  I reviewed my understanding, including limitations of ongoing interventions and high risk for further decline despite aggressive treatment efforts.  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  He then got bored and received a second degree in biochemistry.  He is supported by 2 aunts, who live in [place], and 1 aunt who lives locally in [place].  His father also lives in [place].  He is a Curator.  He previously enjoyed hanging out, watching TV shows, being outside.  Most importantly, he likes to work.  Palliative Symptoms: Patient acknowledges pain, unable to quantify.  Tells me he "has not thought about it too much"  Discussion: Patient has heard of palliative care in the past, at the dialysis center he went to in [place].  He acknowledges that it can be difficult to make complex medical decisions and the benefit of additional support.  Explored patient's thoughts and feelings on his current situation and what the hardest part of his hospitalization has been.  Patient shares "when you want to live, you have no choice but to deal with it."  He shares that when he first began to have medical issues over 20 years ago, he had a therapist who advised him "your body is sick, but your mind is not."  He still lives by this and finds it to be true in his acute illness.  He confirms that he is using his mental strength to cope through his tenuous situation and hopes to  reach his goal of recovery. We discussed the limitations of aggressive medical interventions and the possibility of a fistula study and ligation, though I fear this may not impact his overall prognosis. Patient has never had discussions about the alternative option of comfort focused care.  I reviewed what this would look like and emphasized the importance of considering his quality of  life, overall suffering, what would be acceptable to him in the future.  He would want to be able to work again in his best case scenario.  He does not have any additional questions and does not indicate any interest in comfort care at this time. Patient prefers his primary contact to be his dad and is agreeable to this PA reaching out.  I attempted to call patient's father, but was unable to reach.  I left a voicemail with request for return call.   The difference between aggressive medical intervention and comfort care was considered in light of the patient's goals of care.   Discussed the importance of continued conversation with family and the medical providers regarding overall plan of care and treatment options, ensuring decisions are within the context of the patient's values and GOCs.   Questions and concerns were addressed. The family was encouraged to call with questions or concerns.  PMT will continue to support holistically.   SUMMARY OF RECOMMENDATIONS   -Continue full code/full prescription treatment -Patient's goal is to recover from his acute illness and live as long as he can.  He does not indicate any readiness for comfort care -Left voicemail for patient's father with request to return my call -Psychosocial and emotional support provided -Ongoing goals of care discussions pending clinical course -PMT will continue to follow and support   Prognosis:  Very poor, likely approaching EOL  Discharge Planning: To Be Determined      Primary Diagnoses: Present on Admission: **None**   Physical Exam Vitals and nursing note reviewed.  Constitutional:      General: He is not in acute distress.    Appearance: He is ill-appearing.     Interventions: Nasal cannula in place.     Comments: Cortrak in place  Cardiovascular:     Rate and Rhythm: Normal rate.  Pulmonary:     Effort: Pulmonary effort is normal.  Skin:    General: Skin is cool and dry.  Neurological:      Mental Status: He is lethargic.     Vital Signs: BP (!) 83/25   Pulse 80   Temp 98.5 F (36.9 C) (Oral)   Resp (!) 23   Ht 6\' 3"  (1.905 m)   Wt 80.2 kg   SpO2 93%   BMI 22.10 kg/m  Pain Scale: 0-10 POSS *See Group Information*: 1-Acceptable,Awake and alert Pain Score: Asleep   SpO2: SpO2: 93 % O2 Device:SpO2: 93 % O2 Flow Rate: .O2 Flow Rate (L/min): 2 L/min   Palliative Assessment/Data:      MDM: High   Juliani Laduke Jeni Salles, PA-C  Palliative Medicine Team Team phone # [phone]  Thank you for allowing the Palliative Medicine Team to assist in the care of this patient. Please utilize secure chat with additional questions, if there is no response within 30 minutes please call the above phone number.  Palliative Medicine Team providers are available by phone from 7am to 7pm daily and can be reached through the team cell phone.  Should this patient require assistance outside of these hours, please call the patient's attending physician.

## 2023-07-07 NOTE — Progress Notes (Addendum)
      [address]       [place] [address]             [phone]      3 Days Post-Op Procedure(s) (LRB): TEMPORARY PACEMAKER (N/A)  Subjective:  Lethargic.  Requiring increase in pressors overnight.  Objective: Vital signs in last 24 hours: Temp:  [98.2 F (36.8 C)-98.8 F (37.1 C)] 98.8 F (37.1 C) (08/30 0810) Pulse Rate:  [80-88] 80 (08/30 0815) Cardiac Rhythm: Ventricular paced (08/30 0400) Resp:  [8-26] 25 (08/30 0815) SpO2:  [87 %-100 %] 93 % (08/30 0815) Arterial Line BP: (74-129)/(25-60) 104/37 (08/30 0815) Weight:  [80.2 kg] 80.2 kg (08/30 0500)  Hemodynamic parameters for last 24 hours: CVP:  [12 mmHg-20 mmHg] 14 mmHg  Intake/Output from previous day: 08/29 0701 - 08/30 0700 In: 2301.3 [I.V.:1971.3; NG/GT:200; IV Piggyback:100] Out: 2118.1 [Stool:170] Intake/Output this shift: Total I/O In: 76.4 [I.V.:76.4] Out: 85.1   General appearance: Lethargic, ill appearing Heart: regular rate and rhythm and on temp pacer Lungs: diminished breath sounds bilaterally Abdomen: soft, non-tender; bowel sounds normal; no masses,  no organomegaly Extremities: extremities normal, atraumatic, no cyanosis or edema Wound: clean and dry  Lab Results: Recent Labs    07/06/23 0455 07/07/23 0440  WBC 11.2* 8.3  HGB 7.5* 7.3*  HCT 25.4* 25.0*  PLT 170 184   BMET:  Recent Labs    07/06/23 1513 07/07/23 0440  NA 131* 132*  K 4.4 4.3  CL 99 99  CO2 25 22  GLUCOSE 113* 119*  BUN 25* 21*  CREATININE 2.51* 2.44*  CALCIUM 10.4* 10.7*    PT/INR: No results for input(s): "LABPROT", "INR" in the last 72 hours. ABG    Component Value Date/Time   PHART 7.305 (L) 06/28/2023 0756   HCO3 26.3 06/28/2023 0756   TCO2 28 06/28/2023 0756   ACIDBASEDEF 1.0 06/28/2023 0756   O2SAT 92.8 07/07/2023 0440   CBG (last 3)  Recent Labs    07/07/23 0017 07/07/23 0431 07/07/23 0805  GLUCAP 112* 114* 111*    Assessment/Plan: S/P Procedure(s)  (LRB): TEMPORARY PACEMAKER (N/A)  CV- CHB, Temp perm in place.. EP following.. continue to have increase pressor support now on Dob @5 , Epi @15 , Levo@5 , and Vaso@.04... Midodrine, AHF following, significant RV dysfunction on ECHO Pulm- on O2, wean as tolerated, repeat CXR tomorrow Renal- ESRD, unable to perform HD due to increasing pressor support, remains on CRRT.. nephrology following GI- NPO, Cortrak in place.. tube feeds remain on hold due to severe nausea...may ultimately benefit from J tube placement as patient with persistent lethargy/weakness... SLP following ID- afebrile, ABX completed Deconditioning- severe, patient not really able to make progress.Marland Kitchen PT/OT ordered.. Palliative care has also been consulted to clarify goals of care  Remains critically ill, requiring ICU care.    LOS: 15 days   Lowella Dandy, PA-C 07/07/2023   Remains in cardiogenic shock Levo started overnight for low BP Remains on CRRT Palliative care consulted  Corliss Skains

## 2023-07-07 NOTE — Progress Notes (Signed)
Nutrition Follow-up  DOCUMENTATION CODES:   Severe malnutrition in context of chronic illness  INTERVENTION:   Recommend initiation of TPN at this time given severe malnutrition with intolerance to TF. Discussed nutrition poc with Dr. Katrinka Blazing and Pharmacy. Plan to await Palliative Care Consult; pending GOC. Plan to start TPN if continuing current level of care  Plan to trial trickle TF per Dr. Katrinka Blazing. Recommend proceeding with caution given high risk for ischemia, addition of levophed overnight (in addition to vaso, epi and dobutamine) with Diastolic Pressures in 20s to 30s. Recommend holding TF if persistent nausea, dry heaving/vomiting and/or increasing abdominal distention  Tube Feeding via Cortrak (gastric): Vital 1.5 at 20 ml/hr (no titration orders) Goal TF: Vital 1.5 at 60 ml/hr with Pro-Source TF20 60 mL QID TF at goal provides 2480 kcals, 177 g of protein and 1094 mL of free water  Pro-Source TF20 60 mL q 4 hours for now while on trickle TF, each packet provides 20 grams of protein and 80 kcals.    Continue Renal MVI BID Continue High Dose IV thiamine for possible thiamine deficiency, ?beri beri. Continue IV folic acid  Some micronutrient labs still pending (thiamine and Vit C), further recommendations pending results  NUTRITION DIAGNOSIS:   Severe Malnutrition related to chronic illness as evidenced by severe muscle depletion, severe fat depletion.  Continues  GOAL:   Patient will meet greater than or equal to 90% of their needs  Progressing  MONITOR:   PO intake, TF tolerance, Labs, Weight trends  REASON FOR ASSESSMENT:   Consult Assessment of nutrition requirement/status  ASSESSMENT:   52 y/o male with h/o glomerulonephritis (age 89) s/p renal transplant (1999) and right and left renal transplants (2015), ESRD on HD (>20 yrs), HTN, bipolar 2 disorder, PTSD, ADHD, anxiety, depression, GERD, OSA, NICM, thrombocytopenia, pulmonary hypertension, afib s/p DCCV  (03/2019), CHF, severe mitral and tricuspid valve regurgitation and moderate aortic stenosis s/p mitral valve repair, tricuspid valve annuloplasty, aortic valve replacement and full right and left maze procedure 8/16.  8/15 AVR, MVR, TVR by Dr. Leafy Ro 8/16 CRRT initiated 8/17 Extubated, CL diet 8/19 Chest tube inserted for effusion, BiPap overnight 8/20 No BM, abd xray with nonobstructive bowel gas pattern 8/21 Cortrak placed, TF initiated 8/22 NPO due to mental status, TF continue 8/25 CRRT discontinued  8/26 CRRT restarted 8/26 CRRT 8/28 TF on hold 8/29 TF remains on hold 8/30 Trickle TF resumed per MD  Dobutamine 5, Epinephrine at 16, vasopressin 0.04. Levophed at 5 added overnight. Diastolic pressures 20s and 30s Lactic Acid (0.9 (wdl) yesterday, 1.0 (wdl) today  NPO, Cortrak remains in place. Mental status remains the same. TF on hold since 8/28 due to suspected poor gut perfusion (high risk for ischemia) with increasing pressors and Diastolic pressure consistently <50 (30s). MD ordered trial of trickle TF to resume today. TF has been off and on for a while now due to nausea, dry heaving and vomiting. Recommend holding trickle TF if GI symptoms occur  Noted Vascular following, AV fistula study occlusion study pending, possible AV fistula ligation if indicated  Receiving 500 mg IV Thiamine TID x 2 days then 100 mg daily for possible thiamine deficiency, ?Beri Beri. Thiamine level still pending  Noted corrected calcium almost 12; remains on CRRT, keeping net even  Rectal tube with 170 mL in 24 hours  Micronutrient Labs:  CRP: 7.9 (H) Ceruloplasmin 36.5 (H) Copper: 131 (wdl) Folate, serum: 3.6 (L) 06/26/23 Iron, serum: 121(wdl) Thiamine: pending Vitamin B12:  1175 (  H) 06/26/23 Vitamin B6: 21.6 (wdl) Vitamin C: pending Zinc: 95 (wdl)  Labs:sodium 132 (L), potassium 4.3 (wdl), corrected calcium 11.98 (H), albumin 2.4, CBGs 105-117 Meds: reglan, ss novolog, IV folic acid, IV  thiamine, midodrine   Diet Order:   Diet Order             Diet NPO time specified  Diet effective now                   EDUCATION NEEDS:   Not appropriate for education at this time  Skin:  Skin Assessment: Skin Integrity Issues: Skin Integrity Issues:: Incisions Incisions: sternal  Last BM:  type 7 stool via rectal tube  Height:   Ht Readings from Last 1 Encounters:  07/04/23 6\' 3"  (1.905 m)    Weight:   Wt Readings from Last 1 Encounters:  07/07/23 80.2 kg    Ideal Body Weight:  89 kg  BMI:  Body mass index is 22.1 kg/m.  Estimated Nutritional Needs:   Kcal:  2400-2600 kcals  Protein:  155-190 g  Fluid:  1L plus UOP   [name] MS, RDN, LDN, CNSC Registered Dietitian 3 Clinical Nutrition RD Pager and On-Call Pager Number Located in [place]

## 2023-07-08 DIAGNOSIS — E43 Unspecified severe protein-calorie malnutrition: Secondary | ICD-10-CM | POA: Diagnosis not present

## 2023-07-08 DIAGNOSIS — I071 Rheumatic tricuspid insufficiency: Secondary | ICD-10-CM | POA: Diagnosis not present

## 2023-07-08 DIAGNOSIS — Z7189 Other specified counseling: Secondary | ICD-10-CM | POA: Diagnosis not present

## 2023-07-08 DIAGNOSIS — Z9889 Other specified postprocedural states: Secondary | ICD-10-CM | POA: Diagnosis not present

## 2023-07-08 DIAGNOSIS — I340 Nonrheumatic mitral (valve) insufficiency: Secondary | ICD-10-CM | POA: Diagnosis not present

## 2023-07-08 DIAGNOSIS — Z515 Encounter for palliative care: Secondary | ICD-10-CM | POA: Diagnosis not present

## 2023-07-08 DIAGNOSIS — I5023 Acute on chronic systolic (congestive) heart failure: Secondary | ICD-10-CM | POA: Diagnosis not present

## 2023-07-08 LAB — CULTURE, BLOOD (ROUTINE X 2)
Culture: NO GROWTH
Culture: NO GROWTH
Special Requests: ADEQUATE
Special Requests: ADEQUATE

## 2023-07-08 LAB — CBC
HCT: 24.8 % — ABNORMAL LOW (ref 39.0–52.0)
Hemoglobin: 7.3 g/dL — ABNORMAL LOW (ref 13.0–17.0)
MCH: 33.3 pg (ref 26.0–34.0)
MCHC: 29.4 g/dL — ABNORMAL LOW (ref 30.0–36.0)
MCV: 113.2 fL — ABNORMAL HIGH (ref 80.0–100.0)
Platelets: 166 10*3/uL (ref 150–400)
RBC: 2.19 MIL/uL — ABNORMAL LOW (ref 4.22–5.81)
RDW: 19.8 % — ABNORMAL HIGH (ref 11.5–15.5)
WBC: 6.9 10*3/uL (ref 4.0–10.5)
nRBC: 1.9 % — ABNORMAL HIGH (ref 0.0–0.2)

## 2023-07-08 LAB — RENAL FUNCTION PANEL
Albumin: 2.4 g/dL — ABNORMAL LOW (ref 3.5–5.0)
Albumin: 2.4 g/dL — ABNORMAL LOW (ref 3.5–5.0)
Anion gap: 8 (ref 5–15)
Anion gap: 6 (ref 5–15)
BUN: 27 mg/dL — ABNORMAL HIGH (ref 6–20)
BUN: 27 mg/dL — ABNORMAL HIGH (ref 6–20)
CO2: 24 mmol/L (ref 22–32)
CO2: 25 mmol/L (ref 22–32)
Calcium: 10.7 mg/dL — ABNORMAL HIGH (ref 8.9–10.3)
Calcium: 9.8 mg/dL (ref 8.9–10.3)
Chloride: 101 mmol/L (ref 98–111)
Chloride: 103 mmol/L (ref 98–111)
Creatinine, Ser: 2.79 mg/dL — ABNORMAL HIGH (ref 0.61–1.24)
Creatinine, Ser: 2.15 mg/dL — ABNORMAL HIGH (ref 0.61–1.24)
GFR, Estimated: 27 mL/min — ABNORMAL LOW (ref >60)
GFR, Estimated: 37 mL/min — ABNORMAL LOW (ref >60)
Glucose, Bld: 144 mg/dL — ABNORMAL HIGH (ref 70–99)
Glucose, Bld: 133 mg/dL — ABNORMAL HIGH (ref 70–99)
Phosphorus: 3.9 mg/dL (ref 2.5–4.6)
Phosphorus: 2.7 mg/dL (ref 2.5–4.6)
Potassium: 3.9 mmol/L (ref 3.5–5.1)
Potassium: 3.7 mmol/L (ref 3.5–5.1)
Sodium: 133 mmol/L — ABNORMAL LOW (ref 135–145)
Sodium: 134 mmol/L — ABNORMAL LOW (ref 135–145)

## 2023-07-08 LAB — GLUCOSE, CAPILLARY
Glucose-Capillary: 106 mg/dL — ABNORMAL HIGH (ref 70–99)
Glucose-Capillary: 116 mg/dL — ABNORMAL HIGH (ref 70–99)
Glucose-Capillary: 132 mg/dL — ABNORMAL HIGH (ref 70–99)
Glucose-Capillary: 134 mg/dL — ABNORMAL HIGH (ref 70–99)
Glucose-Capillary: 134 mg/dL — ABNORMAL HIGH (ref 70–99)
Glucose-Capillary: 138 mg/dL — ABNORMAL HIGH (ref 70–99)
Glucose-Capillary: 141 mg/dL — ABNORMAL HIGH (ref 70–99)
Glucose-Capillary: 141 mg/dL — ABNORMAL HIGH (ref 70–99)

## 2023-07-08 LAB — COOXEMETRY PANEL
Carboxyhemoglobin: 1.4 % (ref 0.5–1.5)
Methemoglobin: 2.5 % — ABNORMAL HIGH (ref 0.0–1.5)
O2 Saturation: 87.3 %
Total hemoglobin: 8 g/dL — ABNORMAL LOW (ref 12.0–16.0)

## 2023-07-08 LAB — MAGNESIUM: Magnesium: 2.6 mg/dL — ABNORMAL HIGH (ref 1.7–2.4)

## 2023-07-08 MED ORDER — VITAL 1.5 CAL PO LIQD
1000.0000 mL | ORAL | Status: DC
  Administered 2023-07-09: 1000 mL

## 2023-07-08 MED ORDER — ACETAMINOPHEN 325 MG PO TABS
650.0000 mg | ORAL_TABLET | ORAL | Status: DC | PRN
  Administered 2023-07-08 – 2023-07-10 (×5): 650 mg via ORAL
  Filled 2023-07-08 (×6): qty 2

## 2023-07-08 NOTE — Progress Notes (Signed)
NAME:  [name], MRN:  [id number], DOB:  1971-09-17, LOS: 16 ADMISSION DATE:  06/22/2023, CONSULTATION DATE: 06/22/2023 REFERRING MD: Leafy Ro - TCTS, CHIEF COMPLAINT: Postcardiac surgery  History of Present Illness:  This is a 52 year old gentleman, past medical history of anxiety depression, bipolar disease, congestive heart failure chronic diastolic heart failure kidney disease followed by renal transplant in 1998-08-06 repeat renal transplant in 2014-08-06.  Patient underwent surgery today for severe mitral regurgitation severe tricuspid regurgitation and moderate aortic stenosis.  He also has atrial fibrillation received maze procedure.  He had a complex mitral valve repair a tricuspid valve annuloplasty and aortic valve replacement followed by full right and left maze procedure.  Pertinent Medical History:   Past Medical History:  Diagnosis Date   ADHD (attention deficit hyperactivity disorder)    Anemia    Anemia, chronic disease 07/13/2011   Anxiety    Arthritis    Bipolar 2 disorder (HCC) 08/18/2011   Diagnosed in 2010/08/06.    Bipolar disorder (HCC)    Blood dyscrasia    Nephrotic Anemia   Blood transfusion    Blood transfusion without reported diagnosis    Chronic diastolic CHF (congestive heart failure) (HCC)    Complication of anesthesia    Woke up intubated gets combative  afraid to be alone    Congestive heart failure (CHF) (HCC) 07/13/2011   Onset Aug 06, 2005.  Followed by Dr. Eden Emms.  S/p cardiac catheterization in [facility] Dr. Anne Fu..  S/p cardiac catheterization in Aug 06, 2013 by Nishan.  Echo 06-Aug-2013.    Depression    Dysrhythmia    A. Fib 07-Aug-2023 with DCCV in May 2024   Erectile dysfunction    ESRD on hemodialysis (HCC) 07/13/2011   Glomerulonephritis at age 14, started HD in 08-07-95.  Deceased donor renal transplant 1998-08-06 at [facility], then kidney failed and went back on HD in 08/06/2005.  L forearm AVF . s/p repeat renal transplant 10/2014 [facility].   GI bleed 11/01/2011   Rectal bleeding with  emesis/diarrhea    Heart murmur    Hemodialysis status (HCC)    Hydrocele, right 11/25/2014   Hypertension    Hypertensive emergency 01/06/2013   Hypoglycemia 01/11/2013   Influenza-like illness 12/18/2013   Mild ascending aorta dilatation (HCC)    NICM (nonischemic cardiomyopathy) (HCC)    Pericardial effusion    a. Mod by echo in 06-Aug-2013, similar to prior.   Peripheral polyneuropathy 08/04/2016   Pneumonia    Pulmonary edema 01/06/2013   Pulmonary hypertension associated with end stage renal disease on dialysis [facility]) 07/13/2011   Renal transplant, status post 11/25/2014   Pt with Glomerulonephritis at age 4. Deceased donor renal transplant 08/06/98 right and left renal transplant 10/2014.  Followed by [facility] transplant team; nephrologist is Dr. Lonna Cobb.    Respiratory failure with hypoxia (HCC) 01/06/2013   Shortness of breath 12/12/2012   Swollen testicle 07/13/2011   URI (upper respiratory infection) 01/25/2012    Significant Hospital Events: Including procedures, antibiotic start and stop dates in addition to other pertinent events   8/15 - AVR, MVR, TVR with TCTS ([place]). Remains on multiple pressors (NE, Epi, vaso). 8/16 - LIJ Trialysis catheter placed for CRRT.  8/17 extubated  8/18 - remains on CVVHD, 3 pressors  8/19-8/20 improving pressor needs 8/21 agitated delirium; cortrak 8/26 CRRT restarted 8/27 TVP placed, brady overnight and pacing turned to 80  Interim History / Subjective:  No events, tolerating 20/hr TF Stable pressor needs Even on CRRT  Objective:  Blood pressure Marland Kitchen)  106/13, pulse 81, temperature 98.3 F (36.8 C), temperature source Oral, resp. rate 19, height 6\' 3"  (1.905 m), weight 76.8 kg, SpO2 97%. CVP:  [9 mmHg-22 mmHg] 11 mmHg      Intake/Output Summary (Last 24 hours) at 07/08/2023 0931 Last data filed at 07/08/2023 0900 Gross per 24 hour  Intake 3352.11 ml  Output 3157.8 ml  Net 194.31 ml   Filed Weights   07/06/23 0500 07/07/23  0500 07/08/23 0700  Weight: 82.2 kg 80.2 kg 76.8 kg   Physical Examination:  No distress Moves to command but weak +murmur, ext warm AV fistulas bilateral upper arms good thrill Lungs scattered rhonci but similar to yesterday Aox3  Coox 87 Stable BMP Stable anemia Epi 15, Levo 4, Vaso 0.04, midodrine 15 TID  Resolved issues:   Possible PNA. - s/p Zosyn course  Right pleural effusion - s/p pigtail chest tube with subsequent removal 8/25. - Supportive care. - Push IS.  Assessment & Plan:   Mitral valve regurgitation, tricuspid regurgitation and aortic stenosis status post valvular repair x 3 Postoperative junctional rhythm/CHB  - now pacer dependent (s/p TVP placement 8/27), eventual need for PPM Shock: Postoperative distributive shock and vasoplegia likely resolved by now. RV failure, Possible sepsis now off ABX. Enlarging pericardial effusion need to rule out tamponade.  AoC HFrEF- intra-op TEE with EF 45%, RV mod-severely reduced Afib s/p MAZE - TCTS managing. - EP following. - TVP per EP - Epi, vaso, dobutamine, systolic 95 goal - Continue Midodrine. - PT/OT as able. - Tentative plan for fistula occlusion RHC ?Tuesday  Pericardial effusion - larger but no real tamponade physiology; hard to get to and don't think it will affect hemodynamics  Acute renal failure: Hx CKD, worsened by hemodynamic insults.  History of kidney disease status post renal transplant (failed 1999 and 2015). - Nephrology following. - Continue CRRT - Follow BMP.  ABLA, postoperative as expected.  No active bleeding at present. Thrombocytopenia, ?chronic- stable. - Transfuse for Hgb < 7. - Monitor for bleeding.  Postoperative encephalopathy, ICU delirium- now seems to have mostly cleared.  Situational anxiety. - Continue Escitalopram, Seroquel at bedtime.  Moderate malnutrition, epigastric discomfort - Reglan and retrial trickle feeds, check lactate, query intestinal ischemia - Empiric  thiamine for ?Wernicke or beriberi  Possible gout flare. - Continue Colchicine.  Deconditioning. - PT/OT as able.   Best Practice (right click and "Reselect all SmartList Selections" daily)   Diet/type:TF on hold DVT prophylaxis:heparin ppx GI prophylaxis: PPI Lines: Central line Foley:  No  Code Status:  full code Last date of multidisciplinary goals of care discussion [Per Primary Team]   33 min cc time Myrla Halsted 07/08/2023 9:31 AM

## 2023-07-08 NOTE — Progress Notes (Signed)
[facility] Progress Note  Subjective:  He remains on CRRT - seen and examined on CRRT and procedure supervised.  He had 2.5 liters UF over 8/30 with CRRT with a goal of keep even to optimize hemodynamics and reduce pressor requirement. Epi is at 16 mcg/min and Levo is at 4 mcg/min.  He continues on vasopressin at 0.04 units/min and dobutamine.  Palliative care was consulted given concern regarding his advanced heart failure and poor prognosis.  He wanted to continue with aggressive care.  Team has discussed even ligating AVF to see if this helps him and we are at a point where any measure such as that which could sustain his life would be appropriate to consider from a renal standpoint.   Review of systems:    He reports that breathing is ok  He reports incisional/sternal chest discomfort No vomiting; RN states flexiseal output decreased    Vitals:   07/08/23 0200 07/08/23 0300 07/08/23 0400 07/08/23 0500  BP:      Pulse: 80 80 80 80  Resp: (!) 24 (!) 23 (!) 21 (!) 22  Temp:      TempSrc:      SpO2: 93% 96% 95% 93%  Weight:      Height:        Physical Exam:       General adult male in bed in no acute distress - critically ill  HEENT normocephalic atraumatic extraocular movements intact sclera anicteric Neck supple trachea midline Lungs clear but reduced to auscultation bilaterally normal work of breathing at rest; on 4 liters nasal cannula   Heart S1S2 Abdomen soft nontender nondistended Extremities no edema  Psych normal mood and affect Neuro - resting and awakens more easily; Oriented to person and situation and paused but got year correct   Access RUE AVF with bruit and thrill; left internal jugular nontunneled dialysis catheter      OP HD: [place] triad MWF nocturnal 3h    F250     94kg   2K/2.5 Ca bath   RUE AVF    Summary: [name] is a/an 52 y.o. male with a past medical history notable for ESRD on HD admitted with severe MR and TR and aortic  stenosis s/p repair/replacement.    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  Per cardiothoracic surgery  # Complete heart block - post-op, per EP/ cardiology   # ESRD - pt was too hypotensive for hemodialysis post-op.  He is s/p CRRT 8/16 - 8/25.  Then was restarted on CRRT on 8/26 to optimize volume in the setting of continued pressor requirement  - Continue CRRT.  UF goal of keep even as tolerated.  Please contact nephrology for changes to UF goal.  Will reach out to CHF team to confirm their goals for UF as well    - He is on 4K dialysate. trend BID labs   # Distributive Shock postoperatively - Pressors per primary team.  On midodrine.  Fluid volume overload is improved.  Last weight charted as 80.2 from 07/07/23  # Pericardial effusion - per CT surgery and CHF.  We are optimizing volume status with CRRT as above.  Assessed as moderate last TTE   # Anemia of ESRD - transfuse as needed per primary team.  Concurrent thrombocytopenia improved.  Aranesp increased to 150 mcg weekly on Tuesdays    # MBD ckd - hypercalcemia.  Note he was transitioned off of nepro after starting back on CRRT.  Phos acceptable.  No binders currently as on CRRT - normally on renvela.  Check intact PTH.  May need sensipar.    # Vascular access - RUE AVF had been functioning well but have been unable to use due to shock and CRRT requirement.  Temporary dialysis catheter placed for CRRT.    Disposition - in ICU on CRRT   Recent Labs  Lab 07/07/23 0440 07/07/23 1635 07/08/23 0345  HGB 7.3*  --  7.3*  ALBUMIN 2.4* 2.4* 2.4*  CALCIUM 10.7* 10.7* 10.7*  PHOS 4.5 3.9 3.9  CREATININE 2.44* 2.41* 2.79*  K 4.3 4.1 3.9   No results for input(s): "IRON", "TIBC", "FERRITIN" in the last 168 hours.  Inpatient medications:  acyclovir  200 mg Per Tube BID   aspirin  81 mg Per Tube Daily   atorvastatin  10 mg Per Tube QHS   Chlorhexidine Gluconate Cloth  6  each Topical Daily   colchicine  0.3 mg Per Tube Once every 5 days   [START ON 07/11/2023] darbepoetin (ARANESP) injection - DIALYSIS  150 mcg Subcutaneous Q Tue-1800   docusate  100 mg Per Tube BID   escitalopram  10 mg Per Tube Daily   feeding supplement (PROSource TF20)  60 mL Per Tube Q4H   folic acid  1 mg Intravenous Daily   guaiFENesin  15 mL Per Tube BID   heparin injection (subcutaneous)  5,000 Units Subcutaneous Q8H   insulin aspart  0-24 Units Subcutaneous Q4H   lidocaine-prilocaine  1 Application Topical Q M,W,F   metoCLOPramide (REGLAN) injection  5 mg Intravenous Q8H   midodrine  15 mg Per Tube TID   multivitamin  1 tablet Per Tube BID   mouth rinse  15 mL Mouth Rinse 4 times per day   pantoprazole (PROTONIX) IV  40 mg Intravenous Q12H   polyethylene glycol  17 g Per Tube Daily   QUEtiapine  25 mg Per Tube QHS   sodium chloride flush  3 mL Intravenous Q12H   [START ON 07/09/2023] thiamine (VITAMIN B1) injection  100 mg Intravenous Daily     prismasol BGK 4/2.5 400 mL/hr at 07/07/23 1757    prismasol BGK 4/2.5 400 mL/hr at 07/07/23 1755   sodium chloride 10 mL/hr at 07/08/23 0500   sodium chloride 10 mL/hr at 07/08/23 0500   albumin human Stopped (06/23/23 0040)   DOBUTamine 5 mcg/kg/min (07/08/23 0500)   epinephrine 16 mcg/min (07/08/23 0500)   feeding supplement (VITAL 1.5 CAL) 20 mL/hr at 07/08/23 0500   norepinephrine (LEVOPHED) Adult infusion 4.5 mcg/min (07/08/23 0500)   prismasol BGK 4/2.5 1,800 mL/hr at 07/08/23 0121   thiamine (VITAMIN B1) injection Stopped (07/07/23 2146)   vasopressin 0.04 Units/min (07/08/23 0500)   sodium chloride, sodium chloride, albumin human, bisacodyl **OR** bisacodyl, clonazePAM, heparin, ondansetron (ZOFRAN) IV, mouth rinse, oxyCODONE, phenol, simethicone, sodium chloride flush, traMADol     Estanislado Emms, MD 6:22 AM 07/08/2023

## 2023-07-08 NOTE — Progress Notes (Signed)
Advanced Heart Failure Rounding Note  PCP-Cardiologist: Reatha Harps, MD   Subjective:   8/15: Elective triple vale surgery + MAZE. Underwent aortic valve replacement + mitral and tricuspid valve repairs by Dr. Milinda Antis 8/22: AHF consulted for high-output HF with persistent post-op shock 8/25: CRRT stopped 8/26: CRRT restarted  08/27: S/p TTVP Echo: EF 70-75%, severe LVH, RV mod reduced, RV severely enlarged. LA/RA mod dilated. Large pericardial effusion. No evidence of tamponade.    Currently on 15 Epi + 4 NE + 0.04 Vaso + 5 DBA. Midodrine 15 tid CO-OX 87% (poss sepsis + AV fistula).  Remains on CVVHD. Keeping even   Back on TFs at low dose.   Lethargic but will follow commands   Objective:    Weight Range: 76.8 kg Body mass index is 21.16 kg/m.   Vital Signs:   Temp:  [98.3 F (36.8 C)-98.5 F (36.9 C)] 98.3 F (36.8 C) (08/31 0758) Pulse Rate:  [76-84] 81 (08/31 0915) Resp:  [9-27] 19 (08/31 0915) BP: (75-112)/(13-26) 106/13 (08/30 1500) SpO2:  [92 %-100 %] 97 % (08/31 0915) Arterial Line BP: (92-128)/(33-52) 93/36 (08/31 0915) Weight:  [76.8 kg] 76.8 kg (08/31 0700) Last BM Date : 07/07/23  Weight change: Filed Weights   07/06/23 0500 07/07/23 0500 07/08/23 0700  Weight: 82.2 kg 80.2 kg 76.8 kg   Intake/Output:   Intake/Output Summary (Last 24 hours) at 07/08/2023 0953 Last data filed at 07/08/2023 0900 Gross per 24 hour  Intake 3352.11 ml  Output 3157.8 ml  Net 194.31 ml    Physical Exam   General:  Ill appearing. Weak. Lethargic No resp difficulty HEENT: normal + cor-trak Neck: supple. JVP to ear . + HD cath   Cor: Regular rate & rhythm. No rubs, gallops or murmurs. Lungs: clear Abdomen: soft, nontender, nondistended. No hepatosplenomegaly. No bruits or masses. Good bowel sounds. Extremities: no cyanosis, clubbing, rash, 1+ edema Neuro: lethargic but communicative cranial nerves grossly intact. moves all 4 extremities w/o difficulty. Affect  pleasant    Telemetry   V paced  80s Personally reviewed   Labs    CBC Recent Labs    07/07/23 0440 07/08/23 0345  WBC 8.3 6.9  HGB 7.3* 7.3*  HCT 25.0* 24.8*  MCV 110.1* 113.2*  PLT 184 166   Basic Metabolic Panel Recent Labs    95/62/13 0440 07/07/23 1635 07/08/23 0345  NA 132* 134* 133*  K 4.3 4.1 3.9  CL 99 102 101  CO2 22 25 24   GLUCOSE 119* 125* 144*  BUN 21* 23* 27*  CREATININE 2.44* 2.41* 2.79*  CALCIUM 10.7* 10.7* 10.7*  MG 2.7*  --  2.6*  PHOS 4.5 3.9 3.9   Liver Function Tests Recent Labs    07/07/23 1635 07/08/23 0345  ALBUMIN 2.4* 2.4*   No results for input(s): "LIPASE", "AMYLASE" in the last 72 hours. Cardiac Enzymes No results for input(s): "CKTOTAL", "CKMB", "CKMBINDEX", "TROPONINI" in the last 72 hours.  BNP: BNP (last 3 results) No results for input(s): "BNP" in the last 8760 hours.  ProBNP (last 3 results) No results for input(s): "PROBNP" in the last 8760 hours.   D-Dimer No results for input(s): "DDIMER" in the last 72 hours. Hemoglobin A1C No results for input(s): "HGBA1C" in the last 72 hours. Fasting Lipid Panel No results for input(s): "CHOL", "HDL", "LDLCALC", "TRIG", "CHOLHDL", "LDLDIRECT" in the last 72 hours. Thyroid Function Tests No results for input(s): "TSH", "T4TOTAL", "T3FREE", "THYROIDAB" in the last 72 hours.  Invalid  input(s): "FREET3"  Other results:   Imaging   No results found.  Medications:     Scheduled Medications:  acyclovir  200 mg Per Tube BID   aspirin  81 mg Per Tube Daily   atorvastatin  10 mg Per Tube QHS   Chlorhexidine Gluconate Cloth  6 each Topical Daily   colchicine  0.3 mg Per Tube Once every 5 days   [START ON 07/11/2023] darbepoetin (ARANESP) injection - DIALYSIS  150 mcg Subcutaneous Q Tue-1800   docusate  100 mg Per Tube BID   escitalopram  10 mg Per Tube Daily   feeding supplement (PROSource TF20)  60 mL Per Tube Q4H   folic acid  1 mg Intravenous Daily   guaiFENesin   15 mL Per Tube BID   heparin injection (subcutaneous)  5,000 Units Subcutaneous Q8H   insulin aspart  0-24 Units Subcutaneous Q4H   lidocaine-prilocaine  1 Application Topical Q M,W,F   metoCLOPramide (REGLAN) injection  5 mg Intravenous Q8H   midodrine  15 mg Per Tube TID   multivitamin  1 tablet Per Tube BID   mouth rinse  15 mL Mouth Rinse 4 times per day   pantoprazole (PROTONIX) IV  40 mg Intravenous Q12H   polyethylene glycol  17 g Per Tube Daily   QUEtiapine  25 mg Per Tube QHS   sodium chloride flush  3 mL Intravenous Q12H   [START ON 07/09/2023] thiamine (VITAMIN B1) injection  100 mg Intravenous Daily    Infusions:   prismasol BGK 4/2.5 400 mL/hr at 07/08/23 0723    prismasol BGK 4/2.5 400 mL/hr at 07/08/23 0723   sodium chloride 10 mL/hr at 07/08/23 0900   sodium chloride 10 mL/hr at 07/08/23 0900   albumin human Stopped (06/23/23 0040)   DOBUTamine 5 mcg/kg/min (07/08/23 0900)   epinephrine 15 mcg/min (07/08/23 0900)   feeding supplement (VITAL 1.5 CAL) 20 mL/hr at 07/08/23 0900   norepinephrine (LEVOPHED) Adult infusion 4 mcg/min (07/08/23 0900)   prismasol BGK 4/2.5 1,800 mL/hr at 07/08/23 0756   vasopressin 0.04 Units/min (07/08/23 0900)    PRN Medications: sodium chloride, sodium chloride, albumin human, bisacodyl **OR** bisacodyl, clonazePAM, heparin, ondansetron (ZOFRAN) IV, mouth rinse, oxyCODONE, phenol, simethicone, sodium chloride flush, traMADol  Patient Profile   52 y/o AAM w/ mildly reduced systolic heart failure (EF once 45-50% in 2013 but normal on subsequent echos) valvular heart disease including severe AS, 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 EF 55-60% w/ mild RV dysfunction by echo in 6/24.    Admitted for elective triple vale surgery + MAZE. S/p bioprosthetic aortic valve replacement + mitral and  tricuspid valve repairs. Post-op course c/b persistent vasoplagic syndrome.   Assessment/Plan  1. Post-op Vasopalegia  - Echo 06/29/23 EF 60-65%, no RWMA, small pericardial effusion present, MV repaired/replaced, trivial MR. TR repaired/replaced, mild TR. AV repaired/replaced.  - Echo 07/04/23: EF 70-75%, severe concentric LVH, RV mod reduced, RV severely enlarged. LA/RA mod dilated. Large pericardial effusion. No evidence of tamponade - Echo 08/29: Moderate pericardial effusion, no tamponade - Suspected high output HF 2/2 AV fistula  - POD #15 remains dependent on high-dose pressors - No overt signs of infection/ possible sepsis other than co-ox being high. Afebrile. - PCT elevated but in setting of ESRD. Completed Zosyn for possible septic component - CVP 13-14. Targeting CVP 12-13 d/t  severe RV failure No change. Keeping even on CVVHD. Has not tolerated volume removal well    2.  Acute on Chronic Systolic Heart Failure w/ Prominent RV Dysfunction  - LVEF once 45-50% in 2013 but normal on subsequent echos. Pre-op TTE 6/25 EF 55-60%, GIIIDD (Restrictive) RV mildly reduced, mod PH w/ estimated RVSP 48 mmHg  - Intra-op TEE (while on 4 mcg of Epi) LVEF 45%, RV mod-severely reduced, stable AV prosthesis w/ normal gradient and stable valvuloplasty rings w/ mod residual MS, mean gradient 7 mmgH.   - echo 07/04/23: EF 70-75%, severe concentric LVH, RV mod reduced, RV severely enlarged. LA/RA mod dilated. Large pericardial effusion. No evidence of tamponade.  - Echo 08/29: EF 65-70%, RV moderately reduced, pericardial effusion slightly smaller, no tamponade - Midodrine 15 mg TID - Inotropes/pressors and volume management as above - No change   3. 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 Post-op CHB  - EP  following. Will need PPM if/when recovered   - S/p RIJ temp wire 08/27 - EPW left in place d/t concern for risk of tamponade   5. 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 Nephrology following   6. H/o Atrial Fibrillation  - S/p MAZE    7. Thrombocytopenia  Anemia - Thrombocytopenia resolved - Hgb 7.3 today. Transfuse per primary team  8. ID: ?sepsis component  - Completed Zosyn per CCM.  - PCT elevated in setting of ESRD  9. Large pericardial effusion - seen on echo 07/04/23. Previously small on echo 06/29/23 - no evidence of tamponade - Limited echo 08/29 stable  We are becoming increasingly concerned that we are not going to have RV recovery. Pressor requirements have remained very high and he had trouble tolerating CVVH overnight d/t hypotension. He is severely weak with MSOF.  Palliative Care has been consulted for goals of care discussion. Will continue  CRITICAL CARE Performed by: Arvilla Meres  Total critical care time: 35 minutes  Critical care time was exclusive of separately billable procedures and treating other patients.  Critical care was necessary to treat or prevent imminent or life-threatening deterioration.  Critical care was time spent personally by me (independent of midlevel providers or residents) on the following activities: development of treatment plan with patient and/or surrogate as well as nursing, discussions with consultants, evaluation of patient's response to treatment, examination of patient, obtaining history from patient or surrogate, ordering and performing treatments and interventions, ordering and review of laboratory studies, ordering and review of radiographic studies, pulse oximetry and re-evaluation of patient's condition.   Length of Stay: 16  Arvilla Meres, MD  07/08/2023, 9:53 AM  Advanced Heart Failure Team Pager [phone] (M-F; 7a - 5p)  Please contact [facility]  for night-coverage after hours (5p -7a ) and weekends on [email or web address]

## 2023-07-08 NOTE — Progress Notes (Signed)
Patient ID: [name], male   DOB: 03/14/1971, 53 y.o.   MRN: [id number] [facility] Evening Rounds:  Hemodynamics unchanged on same vasopressor/inotrope levels.  CRRT keeping even.  Tube feeds increased to 30. Some nausea with meds down tube and flushing. Tube is in stomach on KUB.  BMET    Component Value Date/Time   NA 134 (L) 07/08/2023 1637   K 3.7 07/08/2023 1637   CL 103 07/08/2023 1637   CO2 25 07/08/2023 1637   GLUCOSE 133 (H) 07/08/2023 1637   BUN 27 (H) 07/08/2023 1637   CREATININE 2.15 (H) 07/08/2023 1637   CALCIUM 9.8 07/08/2023 1637   CALCIUM 9.8 02/18/2011 1007   GFRNONAA 37 (L) 07/08/2023 1637

## 2023-07-08 NOTE — Progress Notes (Addendum)
Daily Progress Note   Patient Name: [name]       Date: 07/08/2023 DOB: 03-22-1971  Age: 52 y.o. MRN#: [id number] Attending Physician: Eugenio Hoes, MD Primary Care Physician: Donne Anon, MD Admit Date: 06/22/2023  Reason for Consultation/Follow-up: Establishing goals of care  Subjective: AM: Medical records reviewed including progress notes, labs, and imaging. Patient assessed at the bedside. He is lethargic today, opens his eyes to my voice and then quickly falls asleep again. Discussed with RN. No family present during my visit.   I re-attempted a call to patient's father [name] and was able to reach him as he prepared to come to [facility] for a visit. Provided a medical update and shared my concerns about patient's overall prognosis and lack of progress. He is very appreciative of the honesty, as he feels it is important to prepare for a possible worst case scenario outcome. He is supportive of patient's wishes to continue aggressive care, try studies and procedures that may improve his condition. Father also shares his understanding that his son's body is only able to do so much after decades of illness and may not tolerate aggressive interventions indefinitely.   Patient's father explains that the family has been following [name]'s lead when interpreting updates, as he has a better understanding of the medical field than they do. He feels that patient understands the severity of his illness and still hopes for the best. [name]'s mother has just had a stroke and the family is navigating a lot right now, trying to support him from another state. Emotional support and therapeutic listening was provided.   PM: Received update from RN that patient's family had arrived and was requesting to  speak with a provider. Met with patient's parents as they exited 2H. They state that they were actually hoping to speak with a physician about the severity of patient's illness. I reiterated my concerns as above and explained the team's worry that goals of care conversations were needed sooner rather than later. He would still like to speak with a physician and plans to return on Monday, though they might visit tomorrow. Shared that I would advocate for Dr. Leafy Ro to speak with them, as he returns on Monday as well. Patient's parents are appreciative.  Questions and concerns addressed. PMT will continue to support holistically.  Length of Stay: 16   Physical Exam Vitals and nursing note reviewed.  Constitutional:      General: He is not in acute distress.    Appearance: He is ill-appearing.     Interventions: Nasal cannula in place.  Cardiovascular:     Rate and Rhythm: Normal rate.  Pulmonary:     Effort: Pulmonary effort is normal.  Skin:    General: Skin is cool and dry.  Neurological:     Mental Status: He is lethargic.            Vital Signs: BP (!) 106/13   Pulse 81   Temp 98.3 F (36.8 C) (Oral)   Resp 19   Ht 6\' 3"  (1.905 m)   Wt 76.8 kg   SpO2 98%   BMI 21.16 kg/m  SpO2: SpO2: 98 % O2 Device: O2 Device: Nasal Cannula O2 Flow Rate: O2 Flow Rate (L/min): 3 L/min      Palliative Assessment/Data:   Palliative Care Assessment & Plan   Patient Profile: 52 y.o. male  with past medical history of ESRD on HD 20 year s/p failed renal transplant in 1999 and 2015, moderate pulmonary HTN, severe MR, TR, and severe AS s/p repairs, anxiety, depression, bipolar disorder, CAD, afib s/p MAZE admitted on 06/22/2023 with fatigue and DOE due to valvular disease.    Patient underwent MVR/AVR/TVR 8/15 with post-op course complicated by distributive shock and vasospastic syndrome. He underwent temporary pacemaker placement on 8/27 for heart block and was on CRRT 8/16-8/25, then resumed  on 8/26 for optimization of hemodynamics and reduction of pressor requirements. There is concern for AV fistula in the setting of high output HF.   PMT has been consulted to assist with goals of care conversation on 8/29. Initial request from T[facility] was to defer initial visit to 9/2, however patient's pressor requirements then increased overnight and PMT was asked to see by AHF team on 8/30.  Assessment: Goals of care conversation  AKI on ESRD, on CRRT Acute on chronic HFrEF MR, TR, AS s/p valvular repair x3 Post-op distributive shock Post-op CHB Enlarging pericardial effusion  Recommendations/Plan: Continue full code/full scope treatment Family is supportive of patient's wishes though realistic in possibility of poor outcomes including death Patient's father plans to return for a visit on Monday 9/2, time TBD, and wishes to speak with physician(s) regarding honest prognosis and expectations Psychosocial and emotional support provided PMT will continue to follow and support   Prognosis:  Very poor  Discharge Planning: To Be Determined   Care plan was discussed with Patient, patient's father, care team   Total time: I spent 70 minutes in the care of the patient today in the above activities and documenting the encounter.  MDM high         [name] Mark Salles, PA-C  Palliative Medicine Team Team phone # [phone]  Thank you for allowing the Palliative Medicine Team to assist in the care of this patient. Please utilize secure chat with additional questions, if there is no response within 30 minutes please call the above phone number.  Palliative Medicine Team providers are available by phone from 7am to 7pm daily and can be reached through the team cell phone.  Should this patient require assistance outside of these hours, please call the patient's attending physician.

## 2023-07-08 NOTE — Progress Notes (Signed)
4 Days Post-Op Procedure(s) (LRB): TEMPORARY PACEMAKER (N/A) Subjective:  No complaints.  Objective: Vital signs in last 24 hours: Temp:  [98.3 F (36.8 C)-98.5 F (36.9 C)] 98.3 F (36.8 C) (08/31 0758) Pulse Rate:  [76-84] 81 (08/31 0830) Cardiac Rhythm: Ventricular paced (08/30 2000) Resp:  [9-27] 19 (08/31 0830) BP: (75-112)/(13-26) 106/13 (08/30 1500) SpO2:  [92 %-100 %] 98 % (08/31 0830) Arterial Line BP: (92-128)/(33-52) 102/38 (08/31 0830) Weight:  [76.8 kg] 76.8 kg (08/31 0700)  Hemodynamic parameters for last 24 hours: CVP:  [9 mmHg-22 mmHg] 13 mmHg  Intake/Output from previous day: 08/30 0701 - 08/31 0700 In: 3212.8 [I.V.:2105.8; NG/GT:957; IV Piggyback:150] Out: 2983.9 [Stool:250] Intake/Output this shift: Total I/O In: 242 [I.V.:102; NG/GT:140] Out: 144.2   General appearance: slowed mentation but following commands and answering questions. Neurologic: intact Heart: regular rate and rhythm, S1, S2 normal, 2/6 SEM. Lungs: clear to auscultation bilaterally Abdomen: soft, non-tender; bowel sounds normal Extremities: no edema Wound: incision healing well  Lab Results: Recent Labs    07/07/23 0440 07/08/23 0345  WBC 8.3 6.9  HGB 7.3* 7.3*  HCT 25.0* 24.8*  PLT 184 166   BMET:  Recent Labs    07/07/23 1635 07/08/23 0345  NA 134* 133*  K 4.1 3.9  CL 102 101  CO2 25 24  GLUCOSE 125* 144*  BUN 23* 27*  CREATININE 2.41* 2.79*  CALCIUM 10.7* 10.7*    PT/INR: No results for input(s): "LABPROT", "INR" in the last 72 hours. ABG    Component Value Date/Time   PHART 7.305 (L) 06/28/2023 0756   HCO3 26.3 06/28/2023 0756   TCO2 28 06/28/2023 0756   ACIDBASEDEF 1.0 06/28/2023 0756   O2SAT 87.3 07/08/2023 0413   CBG (last 3)  Recent Labs    07/08/23 0021 07/08/23 0341 07/08/23 0752  GLUCAP 134* 134* 141*    Assessment/Plan:  POD 16  Hemodynamics fairly stable on dobut 5, epi 15, NE 4, vaso 0.04 and midodrine 15 tid. Vasoplegia of  undetermined etiology ( possibly sepsis), complicated by RV failure. CVP 17-18 this am. LV has been hyperdynamic on echo with moderate pericardial effusion without tamponade. There has been concern for component of high output heart failure from fistula.  Co-ox 87%.  V pacing at 80 with temp perm pacer for CHB.  Tolerating CRRT with I/O fairly even. Does not tolerate much volume removal due to hypotension.  Completed course of Zosyn for possible sepsis. He is afebrile with normal WBC ct.   TF at 20/hr.      LOS: 16 days    [name] 07/08/2023

## 2023-07-09 ENCOUNTER — Inpatient Hospital Stay (HOSPITAL_COMMUNITY): Payer: Medicare HMO

## 2023-07-09 DIAGNOSIS — Z9889 Other specified postprocedural states: Secondary | ICD-10-CM | POA: Diagnosis not present

## 2023-07-09 DIAGNOSIS — I5023 Acute on chronic systolic (congestive) heart failure: Secondary | ICD-10-CM | POA: Diagnosis not present

## 2023-07-09 DIAGNOSIS — E43 Unspecified severe protein-calorie malnutrition: Secondary | ICD-10-CM | POA: Diagnosis not present

## 2023-07-09 DIAGNOSIS — I3139 Other pericardial effusion (noninflammatory): Secondary | ICD-10-CM | POA: Diagnosis not present

## 2023-07-09 DIAGNOSIS — Z952 Presence of prosthetic heart valve: Secondary | ICD-10-CM | POA: Diagnosis not present

## 2023-07-09 DIAGNOSIS — I50810 Right heart failure, unspecified: Secondary | ICD-10-CM | POA: Diagnosis not present

## 2023-07-09 LAB — POCT I-STAT 7, (LYTES, BLD GAS, ICA,H+H)
Acid-Base Excess: 0 mmol/L (ref 0.0–2.0)
Bicarbonate: 27.3 mmol/L (ref 20.0–28.0)
Calcium, Ion: 1.45 mmol/L — ABNORMAL HIGH (ref 1.15–1.40)
HCT: 27 % — ABNORMAL LOW (ref 39.0–52.0)
Hemoglobin: 9.2 g/dL — ABNORMAL LOW (ref 13.0–17.0)
O2 Saturation: 98 %
Patient temperature: 98.4
Potassium: 4.1 mmol/L (ref 3.5–5.1)
Sodium: 137 mmol/L (ref 135–145)
TCO2: 29 mmol/L (ref 22–32)
pCO2 arterial: 58.9 mmHg — ABNORMAL HIGH (ref 32–48)
pH, Arterial: 7.274 — ABNORMAL LOW (ref 7.35–7.45)
pO2, Arterial: 111 mmHg — ABNORMAL HIGH (ref 83–108)

## 2023-07-09 LAB — RENAL FUNCTION PANEL
Albumin: 2.4 g/dL — ABNORMAL LOW (ref 3.5–5.0)
Albumin: 2.4 g/dL — ABNORMAL LOW (ref 3.5–5.0)
Anion gap: 8 (ref 5–15)
Anion gap: 6 (ref 5–15)
BUN: 26 mg/dL — ABNORMAL HIGH (ref 6–20)
BUN: 26 mg/dL — ABNORMAL HIGH (ref 6–20)
CO2: 26 mmol/L (ref 22–32)
CO2: 26 mmol/L (ref 22–32)
Calcium: 10.1 mg/dL (ref 8.9–10.3)
Calcium: 10.1 mg/dL (ref 8.9–10.3)
Chloride: 99 mmol/L (ref 98–111)
Chloride: 101 mmol/L (ref 98–111)
Creatinine, Ser: 1.93 mg/dL — ABNORMAL HIGH (ref 0.61–1.24)
Creatinine, Ser: 1.83 mg/dL — ABNORMAL HIGH (ref 0.61–1.24)
GFR, Estimated: 42 mL/min — ABNORMAL LOW (ref >60)
GFR, Estimated: 44 mL/min — ABNORMAL LOW (ref >60)
Glucose, Bld: 136 mg/dL — ABNORMAL HIGH (ref 70–99)
Glucose, Bld: 139 mg/dL — ABNORMAL HIGH (ref 70–99)
Phosphorus: 2.7 mg/dL (ref 2.5–4.6)
Phosphorus: 2.8 mg/dL (ref 2.5–4.6)
Potassium: 3.8 mmol/L (ref 3.5–5.1)
Potassium: 4.1 mmol/L (ref 3.5–5.1)
Sodium: 133 mmol/L — ABNORMAL LOW (ref 135–145)
Sodium: 133 mmol/L — ABNORMAL LOW (ref 135–145)

## 2023-07-09 LAB — BLOOD GAS, ARTERIAL
Acid-base deficit: 1.1 mmol/L (ref 0.0–2.0)
Bicarbonate: 26.4 mmol/L (ref 20.0–28.0)
O2 Saturation: 100 %
Patient temperature: 36.9
pCO2 arterial: 55 mmHg — ABNORMAL HIGH (ref 32–48)
pH, Arterial: 7.29 — ABNORMAL LOW (ref 7.35–7.45)
pO2, Arterial: 133 mmHg — ABNORMAL HIGH (ref 83–108)

## 2023-07-09 LAB — CBC
HCT: 23.9 % — ABNORMAL LOW (ref 39.0–52.0)
Hemoglobin: 7 g/dL — ABNORMAL LOW (ref 13.0–17.0)
MCH: 32.7 pg (ref 26.0–34.0)
MCHC: 29.3 g/dL — ABNORMAL LOW (ref 30.0–36.0)
MCV: 111.7 fL — ABNORMAL HIGH (ref 80.0–100.0)
Platelets: 164 10*3/uL (ref 150–400)
RBC: 2.14 MIL/uL — ABNORMAL LOW (ref 4.22–5.81)
RDW: 20.3 % — ABNORMAL HIGH (ref 11.5–15.5)
WBC: 6.6 10*3/uL (ref 4.0–10.5)
nRBC: 1.4 % — ABNORMAL HIGH (ref 0.0–0.2)

## 2023-07-09 LAB — COOXEMETRY PANEL
Carboxyhemoglobin: 2.6 % — ABNORMAL HIGH (ref 0.5–1.5)
Methemoglobin: <0.7 % (ref 0.0–1.5)
O2 Saturation: 81.6 %
Total hemoglobin: 7.6 g/dL — ABNORMAL LOW (ref 12.0–16.0)

## 2023-07-09 LAB — MAGNESIUM: Magnesium: 2.6 mg/dL — ABNORMAL HIGH (ref 1.7–2.4)

## 2023-07-09 LAB — GLUCOSE, CAPILLARY
Glucose-Capillary: 108 mg/dL — ABNORMAL HIGH (ref 70–99)
Glucose-Capillary: 113 mg/dL — ABNORMAL HIGH (ref 70–99)
Glucose-Capillary: 126 mg/dL — ABNORMAL HIGH (ref 70–99)
Glucose-Capillary: 126 mg/dL — ABNORMAL HIGH (ref 70–99)
Glucose-Capillary: 136 mg/dL — ABNORMAL HIGH (ref 70–99)
Glucose-Capillary: 143 mg/dL — ABNORMAL HIGH (ref 70–99)

## 2023-07-09 LAB — PREPARE RBC (CROSSMATCH)

## 2023-07-09 LAB — ECHOCARDIOGRAM LIMITED
Height: 75 in
Weight: 2772.5 [oz_av]

## 2023-07-09 LAB — VITAMIN C: Vitamin C: 0.7 mg/dL (ref 0.4–2.0)

## 2023-07-09 MED ORDER — SODIUM CHLORIDE 0.9% IV SOLUTION: Freq: Once | INTRAVENOUS | Status: AC

## 2023-07-09 MED ORDER — EPINEPHRINE HCL 5 MG/250ML IV SOLN IN NS
0.0000 ug/min | INTRAVENOUS | Status: DC
  Administered 2023-07-09 – 2023-07-14 (×15): 10 ug/min via INTRAVENOUS
  Administered 2023-07-15: 4.5 ug/min via INTRAVENOUS
  Administered 2023-07-16: 3 ug/min via INTRAVENOUS
  Administered 2023-07-21: 0.5 ug/min via INTRAVENOUS
  Filled 2023-07-09 (×22): qty 250

## 2023-07-09 NOTE — Progress Notes (Signed)
Advanced Heart Failure Rounding Note  PCP-Cardiologist: Reatha Harps, MD   Subjective:   8/15: Elective triple vale surgery + MAZE. Underwent aortic valve replacement + mitral and tricuspid valve repairs by Dr. Milinda Antis 8/22: AHF consulted for high-output HF with persistent post-op shock 8/25: CRRT stopped 8/26: CRRT restarted  08/27: S/p TTVP Echo: EF 70-75%, severe LVH, RV mod reduced, RV severely enlarged. LA/RA mod dilated. Large pericardial effusion. No evidence of tamponade.    Currently on 10 Epi + `0 NE + 0.04 Vaso + 5 DBA. Midodrine 15 tid CO-OX 82% (poss sepsis + AV fistula).  Remains on CVVHD. Keeping even   Back on TFs   Lethargic but will arouse   Objective:    Weight Range: 78.6 kg Body mass index is 21.66 kg/m.   Vital Signs:   Temp:  [97.6 F (36.4 C)-98.6 F (37 C)] 97.6 F (36.4 C) (09/01 0856) Pulse Rate:  [77-84] 80 (09/01 1300) Resp:  [13-27] 19 (09/01 1300) BP: (106-112)/(Mark-43) 107/Mark (09/01 1115) SpO2:  [89 %-100 %] 100 % (09/01 1300) Arterial Line BP: (85-118)/(34-52) 96/38 (09/01 1300) Weight:  [78.6 kg] 78.6 kg (09/01 0500) Last BM Date : 07/08/23  Weight change: Filed Weights   07/07/23 0500 07/08/23 0700 07/09/23 0500  Weight: 80.2 kg 76.8 kg 78.6 kg   Intake/Output:   Intake/Output Summary (Last 24 hours) at 07/09/2023 1424 Last data filed at 07/09/2023 1300 Gross per 24 hour  Intake 4609.34 ml  Output 4010.2 ml  Net 599.14 ml    Physical Exam   General:  Ill appearing. Weak. Lethargic No resp difficulty HEENT: normal + cor-trak Neck: supple. JVP to ear . + HD cath   Cor: Regular rate & rhythm. No rubs, gallops or murmurs. Lungs: clear Abdomen: soft, nontender, nondistended. No hepatosplenomegaly. No bruits or masses. Good bowel sounds. Extremities: no cyanosis, clubbing, rash, edema Neuro: lethargic but arousable    Telemetry   V paced 80 Personally reviewed   Labs    CBC Recent Labs    07/08/23 0345  07/09/23 0356 07/09/23 0848  WBC 6.9 6.6  --   HGB 7.3* 7.0* 9.2*  HCT 24.8* 23.9* 27.0*  MCV 113.2* 111.7*  --   PLT 166 164  --    Basic Metabolic Panel Recent Labs    47/42/59 0345 07/08/23 1637 07/09/23 0356 07/09/23 0848  NA 133* 134* 133* 137  K 3.9 3.7 3.8 4.1  CL 101 103 99  --   CO2 24 25 26   --   GLUCOSE 144* 133* 136*  --   BUN 27* 27* 26*  --   CREATININE 2.79* 2.15* 1.93*  --   CALCIUM 10.7* 9.8 10.1  --   MG 2.6*  --  2.6*  --   PHOS 3.9 2.7 2.7  --    Liver Function Tests Recent Labs    07/08/23 1637 07/09/23 0356  ALBUMIN 2.4* 2.4*   No results for input(s): "LIPASE", "AMYLASE" in the last 72 hours. Cardiac Enzymes No results for input(s): "CKTOTAL", "CKMB", "CKMBINDEX", "TROPONINI" in the last 72 hours.  BNP: BNP (last 3 results) No results for input(s): "BNP" in the last 8760 hours.  ProBNP (last 3 results) No results for input(s): "PROBNP" in the last 8760 hours.   D-Dimer No results for input(s): "DDIMER" in the last 72 hours. Hemoglobin A1C No results for input(s): "HGBA1C" in the last 72 hours. Fasting Lipid Panel No results for input(s): "CHOL", "HDL", "LDLCALC", "TRIG", "CHOLHDL", "LDLDIRECT"  in the last 72 hours. Thyroid Function Tests No results for input(s): "TSH", "T4TOTAL", "T3FREE", "THYROIDAB" in the last 72 hours.  Invalid input(s): "FREET3"  Other results:   Imaging   DG Chest Port 1 View  Result Date: 07/09/2023 CLINICAL DATA:  Shortness of breath.  Ileus. EXAM: PORTABLE CHEST 1 VIEW COMPARISON:  Radiographs 07/04/2023 and 07/03/2023.  CT 06/20/2023. FINDINGS: 0919 hours. Left IJ hemodialysis catheter is unchanged, projecting over the mid SVC and directed laterally. A right subclavian central venous catheter terminates at the level of the mid SVC. Right IJ transvenous pacemaker projects over the right ventricular apex. Feeding tube projects below the diaphragm, tip visualized on abdominal radiographs. Stable cardiomegaly  post median sternotomy and valve replacement. There are persistent bilateral airspace opacities most consistent with edema. No pneumothorax or significant pleural effusion. The bones appear unchanged. IMPRESSION: No significant change from prior study. Persistent bilateral airspace opacities most consistent with edema. Stable support system. Electronically Signed   By: Carey Bullocks M.D.   On: 07/09/2023 13:31   DG Abd 1 View  Result Date: 07/09/2023 CLINICAL DATA:  Shortness of breath.  Ileus. EXAM: ABDOMEN - 1 VIEW COMPARISON:  Radiographs 07/06/2023 and 06/30/2023.  CT 02/16/2011. FINDINGS: 0921 hours. Two supine views the abdomen demonstrate no change in position of the feeding tube, projecting to the distal stomach. No significant change in moderate distension of the transverse colon. Pelvic calcifications are grossly stable, likely related to calcifications within a failed renal transplant based on previous remote CT. Chest findings are dictated separately. IMPRESSION: No significant change in moderate colonic distension. Feeding tube tip projects to the distal stomach. Electronically Signed   By: Carey Bullocks M.D.   On: 07/09/2023 13:28    Medications:     Scheduled Medications:  acyclovir  200 mg Per Tube BID   aspirin  81 mg Per Tube Daily   atorvastatin  10 mg Per Tube QHS   Chlorhexidine Gluconate Cloth  6 each Topical Daily   colchicine  0.3 mg Per Tube Once every 5 days   [START ON 07/11/2023] darbepoetin (ARANESP) injection - DIALYSIS  150 mcg Subcutaneous Q Tue-1800   docusate  100 mg Per Tube BID   escitalopram  10 mg Per Tube Daily   feeding supplement (PROSource TF20)  60 mL Per Tube Q4H   folic acid  1 mg Intravenous Daily   guaiFENesin  15 mL Per Tube BID   heparin injection (subcutaneous)  5,000 Units Subcutaneous Q8H   insulin aspart  0-24 Units Subcutaneous Q4H   lidocaine-prilocaine  1 Application Topical Q M,W,F   metoCLOPramide (REGLAN) injection  5 mg Intravenous  Q8H   midodrine  15 mg Per Tube TID   multivitamin  1 tablet Per Tube BID   mouth rinse  15 mL Mouth Rinse 4 times per day   pantoprazole (PROTONIX) IV  40 mg Intravenous Q12H   polyethylene glycol  17 g Per Tube Daily   QUEtiapine  25 mg Per Tube QHS   sodium chloride flush  3 mL Intravenous Q12H   thiamine (VITAMIN B1) injection  100 mg Intravenous Daily    Infusions:   prismasol BGK 4/2.5 400 mL/hr at 07/09/23 0900    prismasol BGK 4/2.5 400 mL/hr at 07/09/23 0845   sodium chloride 10 mL/hr at 07/09/23 1300   sodium chloride 10 mL/hr at 07/09/23 1300   albumin human Stopped (06/23/23 0040)   DOBUTamine 5 mcg/kg/min (07/09/23 1300)   epinephrine 10 mcg/min (07/09/23  1300)   feeding supplement (VITAL 1.5 CAL) 30 mL/hr at 07/09/23 1300   norepinephrine (LEVOPHED) Adult infusion 10 mcg/min (07/09/23 1300)   prismasol BGK 4/2.5 1,800 mL/hr at 07/09/23 0940   vasopressin 0.04 Units/min (07/09/23 1300)    PRN Medications: sodium chloride, sodium chloride, acetaminophen, albumin human, bisacodyl **OR** bisacodyl, clonazePAM, heparin, ondansetron (ZOFRAN) IV, mouth rinse, oxyCODONE, phenol, simethicone, sodium chloride flush, traMADol  Patient Profile   52 y/o [name] w/ mildly reduced systolic heart failure (EF once 45-50% in 2013 but normal on subsequent echos) valvular heart disease including severe AS, 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 EF 55-60% w/ mild RV dysfunction by echo in 6/24.    Admitted for elective triple vale surgery + MAZE. S/p bioprosthetic aortic valve replacement + mitral and tricuspid valve repairs. Post-op course c/b persistent vasoplagic syndrome.   Assessment/Plan  1. Post-op Vasopalegia  - Echo 06/29/23 EF 60-65%, no RWMA, small pericardial effusion present, MV repaired/replaced, trivial MR. TR repaired/replaced, mild  TR. AV repaired/replaced.  - Echo 07/04/23: EF 70-75%, severe concentric LVH, RV mod reduced, RV severely enlarged. LA/RA mod dilated. Large pericardial effusion. No evidence of tamponade - Echo 08/29: Moderate pericardial effusion, no tamponade - Suspected high output HF 2/2 AV fistula  - POD #16 remains dependent on high-dose pressors - No overt signs of infection/ possible sepsis other than co-ox being high. Afebrile. - PCT elevated but in setting of ESRD. Completed Zosyn for possible septic component - CVP 14. Targeting CVP 12-13 d/t severe RV failure  - Keeping even on CVVHD. Has not tolerated volume removal well. I d/w Dr. Malen Gauze. Will keep CVVHD even unless CVP > 15 then will try to pull 50  - We are not making any meaningful progress. Palliative Care remains involved. Need to try to wean pressors. If unable to wean then need to consider comfort care.    2.  Acute on Chronic Systolic Heart Failure w/ Prominent RV Dysfunction  - LVEF once 45-50% in 2013 but normal on subsequent echos. Pre-op TTE 6/25 EF 55-60%, GIIIDD (Restrictive) RV mildly reduced, mod PH w/ estimated RVSP 48 mmHg  - Intra-op TEE (while on 4 mcg of Epi) LVEF 45%, RV mod-severely reduced, stable AV prosthesis w/ normal gradient and stable valvuloplasty rings w/ mod residual MS, mean gradient 7 mmgH.   - echo 07/04/23: EF 70-75%, severe concentric LVH, RV mod reduced, RV severely enlarged. LA/RA mod dilated. Large pericardial effusion. No evidence of tamponade.  - Echo 08/29: EF 65-70%, RV moderately reduced, pericardial effusion slightly smaller, no tamponade - Midodrine 15 mg TID - Inotropes/pressors and volume management as above = No change   3. 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 Post-op CHB  - EP following. Will need PPM  if/when recovered   - S/p RIJ temp wire 08/27 - EPW left in place d/t concern for risk of tamponade   5. 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 Nephrology following - Plan as above   6. H/o Atrial Fibrillation  - S/p MAZE    7. Thrombocytopenia  Anemia - Thrombocytopenia  resolved - Hgb 7.0 today. Transfuse per primary team  8. ID: ?sepsis component  - Completed Zosyn per CCM.  - PCT elevated in setting of ESRD  9. Large pericardial effusion - seen on echo 07/04/23. Previously small on echo 06/29/23 - no evidence of tamponade - Limited echo 08/29 stable  We are becoming increasingly concerned that we are not going to have RV recovery. Pressor requirements have remained very high and he had trouble tolerating CVVH  Palliative Care on board. Appreciate their assistance  CRITICAL CARE Performed by: Arvilla Meres  Total critical care time: 35 minutes  Critical care time was exclusive of separately billable procedures and treating other patients.  Critical care was necessary to treat or prevent imminent or life-threatening deterioration.  Critical care was time spent personally by me (independent of midlevel providers or residents) on the following activities: development of treatment plan with patient and/or surrogate as well as nursing, discussions with consultants, evaluation of patient's response to treatment, examination of patient, obtaining history from patient or surrogate, ordering and performing treatments and interventions, ordering and review of laboratory studies, ordering and review of radiographic studies, pulse oximetry and re-evaluation of patient's condition.   Length of Stay: 17  Arvilla Meres, MD  07/09/2023, 2:24 PM  Advanced Heart Failure Team Pager [phone] (M-F; 7a - 5p)  Please contact [facility] for night-coverage after hours (5p -7a ) and weekends on [email or web address]

## 2023-07-09 NOTE — Progress Notes (Signed)
[facility] Progress Note  Subjective:  He remains on CRRT - seen and examined on CRRT and procedure supervised.  He had 3.1 liters UF over 8/31 with CRRT with a goal of keep even to optimize hemodynamics and reduce pressor requirement.  Epi is at 12 mcg/min and Levo is at 13 mcg/min.  He continues on vasopressin at 0.04 units/min and dobutamine.  No weight yet down.  Down per the 8/31 weights if accurate. CVP has been 14-20 and most recently 18.  He thinks that overall things are going "ok"  Review of systems:     He reports that breathing is ok  He reports incisional/sternal chest discomfort Had nausea yesterday; no vomiting overnight.  RN states flexiseal output decreased    Vitals:   07/09/23 0345 07/09/23 0400 07/09/23 0415 07/09/23 0615  BP:      Pulse: 80 80 80   Resp: (!) 22 (!) 26 (!) 21   Temp: 97.6 F (36.4 C)   98.4 F (36.9 C)  TempSrc: Axillary   Axillary  SpO2: 92% 95% 95%   Weight:      Height:        Physical Exam:       General adult male in bed in no acute distress - critically ill  HEENT normocephalic atraumatic extraocular movements intact sclera anicteric Neck supple trachea midline Lungs clear but reduced to auscultation bilaterally normal work of breathing at rest; on 4 liters nasal cannula   Heart S1S2 Abdomen soft nontender nondistended Extremities no edema appreciated Psych normal mood and affect Neuro - resting and awakens more easily; Oriented to person and year. Provides some history and answers some questions   Access RUE AVF with bruit and thrill; left internal jugular nontunneled dialysis catheter    Medications reviewed  OP HD: [place] triad MWF nocturnal 3h    F250     94kg   2K/2.5 Ca bath   RUE AVF    Summary: [name] is a/an 52 y.o. male with a past medical history notable for ESRD on HD admitted with severe MR and TR and aortic stenosis s/p repair/replacement.    Assessment/ Plan  # Severe MR and TR and  aortic stenosis  - Status post Tri valve repair on 8/15.  Per cardiothoracic surgery  # Complete heart block - post-op, per EP/ cardiology   # ESRD - pt was too hypotensive for hemodialysis post-op.  He is s/p CRRT 8/16 - 8/25.  Then was restarted on CRRT on 8/26 to optimize volume in the setting of continued pressor requirement  - Continue CRRT.  UF goal of keep even as tolerated.  Please contact nephrology for changes to UF goal.  Will reach out to CHF team to confirm their goals for UF as well    - He is on 4K dialysate. trend BID labs   # Distributive Shock postoperatively - Pressors per primary team.  On midodrine.  Fluid volume overload is improved.  Last weight charted as 76.8 kg from 07/08/23  # Pericardial effusion - per CT surgery and CHF.  We are optimizing volume status with CRRT as above.  Assessed as moderate on last TTE   # Anemia of ESRD - transfuse as needed per primary team.  He is getting a unit of blood today, 9/1. Concurrent thrombocytopenia improved.  Aranesp increased to 150 mcg weekly on Tuesdays    # MBD ckd - hypercalcemia.  Note he was transitioned off of nepro after  starting back on CRRT.  Phos acceptable.  No binders currently as on CRRT - normally on renvela.  Check intact PTH.  May need sensipar.    # Vascular access - RUE AVF had been functioning well but have been unable to use due to shock and CRRT requirement.  Temporary dialysis catheter placed for CRRT.  Disposition - in ICU on CRRT   Recent Labs  Lab 07/08/23 0345 07/08/23 1637 07/09/23 0356  HGB 7.3*  --  7.0*  ALBUMIN 2.4* 2.4* 2.4*  CALCIUM 10.7* 9.8 10.1  PHOS 3.9 2.7 2.7  CREATININE 2.79* 2.15* 1.93*  K 3.9 3.7 3.8   No results for input(s): "IRON", "TIBC", "FERRITIN" in the last 168 hours.  Inpatient medications:  acyclovir  200 mg Per Tube BID   aspirin  81 mg Per Tube Daily   atorvastatin  10 mg Per Tube QHS   Chlorhexidine Gluconate Cloth  6 each Topical Daily   colchicine  0.3 mg  Per Tube Once every 5 days   [START ON 07/11/2023] darbepoetin (ARANESP) injection - DIALYSIS  150 mcg Subcutaneous Q Tue-1800   docusate  100 mg Per Tube BID   escitalopram  10 mg Per Tube Daily   feeding supplement (PROSource TF20)  60 mL Per Tube Q4H   folic acid  1 mg Intravenous Daily   guaiFENesin  15 mL Per Tube BID   heparin injection (subcutaneous)  5,000 Units Subcutaneous Q8H   insulin aspart  0-24 Units Subcutaneous Q4H   lidocaine-prilocaine  1 Application Topical Q M,W,F   metoCLOPramide (REGLAN) injection  5 mg Intravenous Q8H   midodrine  15 mg Per Tube TID   multivitamin  1 tablet Per Tube BID   mouth rinse  15 mL Mouth Rinse 4 times per day   pantoprazole (PROTONIX) IV  40 mg Intravenous Q12H   polyethylene glycol  17 g Per Tube Daily   QUEtiapine  25 mg Per Tube QHS   sodium chloride flush  3 mL Intravenous Q12H   thiamine (VITAMIN B1) injection  100 mg Intravenous Daily     prismasol BGK 4/2.5 400 mL/hr at 07/08/23 2002    prismasol BGK 4/2.5 400 mL/hr at 07/08/23 2001   sodium chloride 10 mL/hr at 07/09/23 0600   sodium chloride Stopped (07/09/23 0530)   albumin human Stopped (06/23/23 0040)   DOBUTamine 5 mcg/kg/min (07/09/23 0600)   epinephrine 12 mcg/min (07/09/23 0600)   feeding supplement (VITAL 1.5 CAL) 30 mL/hr at 07/09/23 0600   norepinephrine (LEVOPHED) Adult infusion 13.013 mcg/min (07/09/23 0600)   prismasol BGK 4/2.5 1,800 mL/hr at 07/09/23 0404   vasopressin 0.04 Units/min (07/09/23 0600)   sodium chloride, sodium chloride, acetaminophen, albumin human, bisacodyl **OR** bisacodyl, clonazePAM, heparin, ondansetron (ZOFRAN) IV, mouth rinse, oxyCODONE, phenol, simethicone, sodium chloride flush, traMADol     Estanislado Emms, MD 6:34 AM 07/09/2023

## 2023-07-09 NOTE — Progress Notes (Signed)
Daily Progress Note   Patient Name: [name]       Date: 07/09/2023 DOB: [birth year]  Age: 52 y.o. MRN#: [id number] Attending Physician: Eugenio Hoes, MD Primary Care Physician: Donne Anon, MD Admit Date: 06/22/2023  Reason for Consultation/Follow-up: Establishing goals of care  Subjective: Medical records reviewed including progress notes, labs, and imaging. Patient assessed at the bedside. He is on BiPAP requesting to have his home device delivered instead. Discussed with RN. No family present during my visit.  Attempted to explore patient's thoughts and feelings on his current illness and whether anything has changed in his goals of care since our initial conversation. Reoriented to our review of comfort focused care an option.   Patient writes that there have been no changes in his goals. I provided him with update on my conversation with his parents and their hope to speak with his attending when they return to visit tomorrow.  Questions and concerns addressed. PMT will continue to support holistically.   Length of Stay: 17   Physical Exam Vitals and nursing note reviewed.  Constitutional:      General: He is not in acute distress.    Appearance: He is ill-appearing.     Interventions: Nasal cannula in place.  Cardiovascular:     Rate and Rhythm: Normal rate.  Pulmonary:     Effort: Pulmonary effort is normal.     Comments: BiPAP in place   Skin:    General: Skin is cool and dry.            Vital Signs: BP (!) 106/41   Pulse 81   Temp 98.6 F (37 C) (Axillary)   Resp 20   Ht 6\' 3"  (1.905 m)   Wt 78.6 kg   SpO2 100%   BMI 21.66 kg/m  SpO2: SpO2: 100 % O2 Device: O2 Device: Nasal Cannula O2 Flow Rate: O2 Flow Rate (L/min): 4 L/min      Palliative  Assessment/Data: 30%   Palliative Care Assessment & Plan   Patient Profile: 52 y.o. male  with past medical history of ESRD on HD 20 year s/p failed renal transplant in 1999 and 2015, moderate pulmonary HTN, severe MR, TR, and severe AS s/p repairs, anxiety, depression, bipolar disorder, CAD, afib s/p MAZE admitted on 06/22/2023 with fatigue and DOE due  to valvular disease.    Patient underwent MVR/AVR/TVR 8/15 with post-op course complicated by distributive shock and vasospastic syndrome. He underwent temporary pacemaker placement on 8/27 for heart block and was on CRRT 8/16-8/25, then resumed on 8/26 for optimization of hemodynamics and reduction of pressor requirements. There is concern for AV fistula in the setting of high output HF.   PMT has been consulted to assist with goals of care conversation on 8/29. Initial request from TCTS was to defer initial visit to 9/2, however patient's pressor requirements then increased overnight and PMT was asked to see by AHF team on 8/30.  Assessment: Goals of care conversation  AKI on ESRD, on CRRT Acute on chronic HFrEF MR, TR, AS s/p valvular repair x3 Post-op distributive shock Post-op CHB Enlarging pericardial effusion  Recommendations/Plan: Continue full code/full scope treatment Family is supportive of patient's wishes though realistic in possibility of poor outcomes including death Patient's parents plan to return for a visit on Monday 9/2, time TBD, and wishes to speak with physician(s) regarding honest prognosis and expectations Psychosocial and emotional support provided PMT will continue to follow and support   Prognosis:  Very poor  Discharge Planning: To Be Determined   Care plan was discussed with Patient, RN   Total time: I spent 35 minutes in the care of the patient today in the above activities and documenting the encounter.          Ayiana Winslett Jeni Salles, PA-C  Palliative Medicine Team Team phone #  [phone]  Thank you for allowing the Palliative Medicine Team to assist in the care of this patient. Please utilize secure chat with additional questions, if there is no response within 30 minutes please call the above phone number.  Palliative Medicine Team providers are available by phone from 7am to 7pm daily and can be reached through the team cell phone.  Should this patient require assistance outside of these hours, please call the patient's attending physician.

## 2023-07-09 NOTE — Plan of Care (Signed)
  Problem: Education: Goal: Knowledge of General Education information will improve Description: Including pain rating scale, medication(s)/side effects and non-pharmacologic comfort measures Outcome: Progressing   Problem: Health Behavior/Discharge Planning: Goal: Ability to manage health-related needs will improve Outcome: Not Progressing   Problem: Clinical Measurements: Goal: Ability to maintain clinical measurements within normal limits will improve Outcome: Not Progressing Goal: Will remain free from infection Outcome: Progressing Goal: Diagnostic test results will improve Outcome: Progressing Goal: Respiratory complications will improve Outcome: Progressing Goal: Cardiovascular complication will be avoided Outcome: Progressing   Problem: Activity: Goal: Risk for activity intolerance will decrease Outcome: Progressing   Problem: Nutrition: Goal: Adequate nutrition will be maintained Outcome: Progressing   Problem: Coping: Goal: Level of anxiety will decrease Outcome: Progressing   Problem: Elimination: Goal: Will not experience complications related to bowel motility Outcome: Progressing   Problem: Pain Managment: Goal: General experience of comfort will improve Outcome: Progressing   Problem: Safety: Goal: Ability to remain free from injury will improve Outcome: Progressing   Problem: Skin Integrity: Goal: Risk for impaired skin integrity will decrease Outcome: Progressing   Problem: Education: Goal: Will demonstrate proper wound care and an understanding of methods to prevent future damage Outcome: Progressing Goal: Knowledge of disease or condition will improve Outcome: Progressing Goal: Knowledge of the prescribed therapeutic regimen will improve Outcome: Progressing   Problem: Activity: Goal: Risk for activity intolerance will decrease Outcome: Progressing   Problem: Cardiac: Goal: Will achieve and/or maintain hemodynamic stability Outcome:  Not Progressing   Problem: Clinical Measurements: Goal: Postoperative complications will be avoided or minimized Outcome: Progressing   Problem: Respiratory: Goal: Respiratory status will improve Outcome: Progressing   Problem: Skin Integrity: Goal: Wound healing without signs and symptoms of infection Outcome: Progressing Goal: Risk for impaired skin integrity will decrease Outcome: Progressing   Problem: Urinary Elimination: Goal: Ability to achieve and maintain adequate renal perfusion and functioning will improve Outcome: Not Progressing

## 2023-07-09 NOTE — Progress Notes (Signed)
NAME:  [name], MRN:  [id number], DOB:  Aug 20, 1971, LOS: 17 ADMISSION DATE:  06/22/2023, CONSULTATION DATE: 06/22/2023 REFERRING MD: Leafy Ro - TCTS, CHIEF COMPLAINT: Postcardiac surgery  History of Present Illness:  This is a 52 year old gentleman, past medical history of anxiety depression, bipolar disease, congestive heart failure chronic diastolic heart failure kidney disease followed by renal transplant in 08/01/1998 repeat renal transplant in 2014/08/01.  Patient underwent surgery today for severe mitral regurgitation severe tricuspid regurgitation and moderate aortic stenosis.  He also has atrial fibrillation received maze procedure.  He had a complex mitral valve repair a tricuspid valve annuloplasty and aortic valve replacement followed by full right and left maze procedure.  Pertinent Medical History:   Past Medical History:  Diagnosis Date   ADHD (attention deficit hyperactivity disorder)    Anemia    Anemia, chronic disease 07/13/2011   Anxiety    Arthritis    Bipolar 2 disorder (HCC) 08/18/2011   Diagnosed in 08/01/10.    Bipolar disorder (HCC)    Blood dyscrasia    Nephrotic Anemia   Blood transfusion    Blood transfusion without reported diagnosis    Chronic diastolic CHF (congestive heart failure) (HCC)    Complication of anesthesia    Woke up intubated gets combative  afraid to be alone    Congestive heart failure (CHF) (HCC) 07/13/2011   Onset 08-01-2005.  Followed by Dr. Eden Emms.  S/p cardiac catheterization in [facility] Dr. Anne Fu..  S/p cardiac catheterization in Aug 01, 2013 by Nishan.  Echo Aug 01, 2013.    Depression    Dysrhythmia    A. Fib 08/02/2023 with DCCV in May 2024   Erectile dysfunction    ESRD on hemodialysis (HCC) 07/13/2011   Glomerulonephritis at age 38, started HD in August 02, 1995.  Deceased donor renal transplant 08/01/98 at [facility], then kidney failed and went back on HD in 08/01/2005.  L forearm AVF . s/p repeat renal transplant 10/2014 [facility].   GI bleed 11/01/2011   Rectal bleeding with  emesis/diarrhea    Heart murmur    Hemodialysis status (HCC)    Hydrocele, right 11/25/2014   Hypertension    Hypertensive emergency 01/06/2013   Hypoglycemia 01/11/2013   Influenza-like illness 12/18/2013   Mild ascending aorta dilatation (HCC)    NICM (nonischemic cardiomyopathy) (HCC)    Pericardial effusion    a. Mod by echo in 01-Aug-2013, similar to prior.   Peripheral polyneuropathy 08/04/2016   Pneumonia    Pulmonary edema 01/06/2013   Pulmonary hypertension associated with end stage renal disease on dialysis [facility]) 07/13/2011   Renal transplant, status post 11/25/2014   Pt with Glomerulonephritis at age 87. Deceased donor renal transplant 01-Aug-1998 right and left renal transplant 10/2014.  Followed by [facility] transplant team; nephrologist is Dr. Lonna Cobb.    Respiratory failure with hypoxia (HCC) 01/06/2013   Shortness of breath 12/12/2012   Swollen testicle 07/13/2011   URI (upper respiratory infection) 01/25/2012    Significant Hospital Events: Including procedures, antibiotic start and stop dates in addition to other pertinent events   8/15 - AVR, MVR, TVR with TCTS ([facility]). Remains on multiple pressors (NE, Epi, vaso). 8/16 - LIJ Trialysis catheter placed for CRRT.  8/17 extubated  8/18 - remains on CVVHD, 3 pressors  8/19-8/20 improving pressor needs 8/21 agitated delirium; cortrak 8/26 CRRT restarted 8/27 TVP placed, brady overnight and pacing turned to 80  Interim History / Subjective:  TF now at 30 C/o back shoulder pain, waxing waning over past several days relieved by  repositioning More sleepy than yesterday  Objective:  Blood pressure (!) 112/43, pulse 81, temperature 98.6 F (37 C), temperature source Axillary, resp. rate (!) 21, height 6\' 3"  (1.905 m), weight 78.6 kg, SpO2 94%. CVP:  [10 mmHg-19 mmHg] 11 mmHg      Intake/Output Summary (Last 24 hours) at 07/09/2023 0721 Last data filed at 07/09/2023 0700 Gross per 24 hour  Intake 4018.15 ml  Output  3824.1 ml  Net 194.05 ml   Filed Weights   07/07/23 0500 07/08/23 0700 07/09/23 0500  Weight: 80.2 kg 76.8 kg 78.6 kg   Physical Examination:  More somnolent +murmur Ext warm Moves to command +muscle wasting Abd mildly distended but + BS  BMP ok H/H down slightly more, will give unit of blood Levo 13 Vaso 0.04 Dobut 5 Epi 10 Coox81  Resolved issues:   Possible PNA. - s/p Zosyn course  Right pleural effusion - s/p pigtail chest tube with subsequent removal 8/25. - Supportive care. - Push IS.  Assessment & Plan:   Mitral valve regurgitation, tricuspid regurgitation and aortic stenosis status post valvular repair x 3 Postoperative junctional rhythm/CHB  - now pacer dependent (s/p TVP placement 8/27), eventual need for PPM Shock: Postoperative distributive shock and vasoplegia likely resolved by now. RV failure, Possible sepsis now off ABX. Enlarging pericardial effusion need to rule out tamponade.  AoC HFrEF- intra-op TEE with EF 45%, RV mod-severely reduced Afib s/p MAZE - TCTS managing. - EP following. - TVP per EP - Epi, vaso, dobutamine, systolic 95 goal - Continue Midodrine. - PT/OT as able. - Tentative plan for fistula occlusion RHC ?Tuesday.  If this does not show improvement in RV mechanics unfortunately may be at end of life.  Dr. Leafy Ro to return this week so we can discuss further.  Pericardial effusion - larger but no real tamponade physiology; hard to get to and don't think it will affect hemodynamics.  Going to repeat limited view today with slowly rising pressor needs.  Acute renal failure: Hx CKD, worsened by hemodynamic insults.  History of kidney disease status post renal transplant (failed 1999 and 2015). - Nephrology following. - Continue CRRT - Follow BMP.  ABLA, postoperative as expected.  No active bleeding at present. Thrombocytopenia, ?chronic- stable. - Transfuse today as coox down a bit - Monitor for bleeding.  Postoperative  encephalopathy, ICU delirium- worse this am - Check ABG - Continue Escitalopram, Seroquel at bedtime.  Moderate malnutrition, epigastric discomfort - Reglan and slowly increase TF (barely tolerated 30 yesterday) - Check KUB - Empiric thiamine for ?Wernicke or beriberi  Possible gout flare. - Continue Colchicine.  Deconditioning. - PT/OT as able.   Best Practice (right click and "Reselect all SmartList Selections" daily)   Diet/type:TF on hold DVT prophylaxis:heparin ppx GI prophylaxis: PPI Lines: Central line Foley:  No  Code Status:  full code Last date of multidisciplinary goals of care discussion [Per Primary Team]   35 min cc time Myrla Halsted 07/09/2023 7:21 AM

## 2023-07-09 NOTE — Progress Notes (Signed)
Patient ID: [name], male   DOB: 1971-08-28, 52 y.o.   MRN: [id number]  TCTS Evening Rounds:  Weaned NE some today, 12 to 6.  Epi at 10, vaso 0.04, dobut 5  Tolerating tube feeds at 30.  CRRT to keep even.

## 2023-07-09 NOTE — Progress Notes (Signed)
TCTS  Subjective:  Complains of back and shoulder pain, likely from laying in bed.  BP fairly stable on epi 10, NE 16, vaso 0.04, midodrine 15 tid.  CVP 12-15. Co-ox 82%.  Objective: Vital signs in last 24 hours: Temp:  [97.6 F (36.4 C)-98.6 F (37 C)] 98.6 F (37 C) (09/01 0635) Pulse Rate:  [77-84] 81 (09/01 0700) Cardiac Rhythm: Ventricular paced (09/01 0415) Resp:  [13-27] 21 (09/01 0700) BP: (112)/(43) 112/43 (09/01 0635) SpO2:  [89 %-100 %] 94 % (09/01 0700) Arterial Line BP: (85-114)/(34-52) 107/43 (09/01 0700) Weight:  [78.6 kg] 78.6 kg (09/01 0500)  Hemodynamic parameters for last 24 hours: CVP:  [10 mmHg-19 mmHg] 11 mmHg  Intake/Output from previous day: 08/31 0701 - 09/01 0700 In: 4018.2 [I.V.:2133; Blood:315; NG/GT:1460.2; IV Piggyback:50] Out: 3824.1 [Stool:545] Intake/Output this shift: No intake/output data recorded.  General appearance: slowed mentation but answers questions Neurologic: intact Heart: regular rate and rhythm, S1, S2 normal, no murmur Lungs: clear to auscultation bilaterally Abdomen: soft, non-tender; bowel sounds normal Extremities: no edema Wound: incision healing well  Lab Results: Recent Labs    07/08/23 0345 07/09/23 0356  WBC 6.9 6.6  HGB 7.3* 7.0*  HCT 24.8* 23.9*  PLT 166 164   BMET:  Recent Labs    07/08/23 1637 07/09/23 0356  NA 134* 133*  K 3.7 3.8  CL 103 99  CO2 25 26  GLUCOSE 133* 136*  BUN 27* 26*  CREATININE 2.15* 1.93*  CALCIUM 9.8 10.1    PT/INR: No results for input(s): "LABPROT", "INR" in the last 72 hours. ABG    Component Value Date/Time   PHART 7.305 (L) 06/28/2023 0756   HCO3 26.3 06/28/2023 0756   TCO2 28 06/28/2023 0756   ACIDBASEDEF 1.0 06/28/2023 0756   O2SAT 81.6 07/09/2023 0356   CBG (last 3)  Recent Labs    07/08/23 1945 07/08/23 2333 07/09/23 0342  GLUCAP 106* 141* 126*    Assessment/Plan:  POD 17.  Still has high inotrope/vasopressor requirement felt to be due to  combination of vasoplegia and RV failure. He had moderate pericardial effusion without tamponade on last echo and repeat ordered. There has been concern for component of high output heart failure from fistula. Co-ox 82%. It is not clear if ligation of his AV fistula will change anything but probably have nothing to lose.  V pacing at 80 via temp perm pacer for CHB.  Tolerating CRRT to keep even.  Remains afebrile with normal WBC ct after course of Zosyn for possible sepsis.  TF at 30 and have stool. Tube tip in stomach on last KUB so may have some gastroparesis.   LOS: 17 days    Alleen Borne 07/09/2023

## 2023-07-09 DEATH — deceased

## 2023-07-10 DIAGNOSIS — R5381 Other malaise: Secondary | ICD-10-CM

## 2023-07-10 DIAGNOSIS — J9601 Acute respiratory failure with hypoxia: Secondary | ICD-10-CM

## 2023-07-10 DIAGNOSIS — I5023 Acute on chronic systolic (congestive) heart failure: Secondary | ICD-10-CM | POA: Diagnosis not present

## 2023-07-10 DIAGNOSIS — R570 Cardiogenic shock: Secondary | ICD-10-CM | POA: Diagnosis not present

## 2023-07-10 DIAGNOSIS — Z515 Encounter for palliative care: Secondary | ICD-10-CM | POA: Diagnosis not present

## 2023-07-10 DIAGNOSIS — Z9889 Other specified postprocedural states: Secondary | ICD-10-CM | POA: Diagnosis not present

## 2023-07-10 LAB — BPAM RBC
Blood Product Expiration Date: 202409142359
Blood Product Expiration Date: 202409222359
ISSUE DATE / TIME: 202409010608
ISSUE DATE / TIME: 202409010807
Unit Type and Rh: 6200
Unit Type and Rh: 6200

## 2023-07-10 LAB — TYPE AND SCREEN
ABO/RH(D): A POS
Antibody Screen: NEGATIVE
Unit division: 0
Unit division: 0

## 2023-07-10 LAB — MAGNESIUM: Magnesium: 2.5 mg/dL — ABNORMAL HIGH (ref 1.7–2.4)

## 2023-07-10 LAB — RENAL FUNCTION PANEL
Albumin: 2.4 g/dL — ABNORMAL LOW (ref 3.5–5.0)
Albumin: 2.5 g/dL — ABNORMAL LOW (ref 3.5–5.0)
Anion gap: 8 (ref 5–15)
Anion gap: 7 (ref 5–15)
BUN: 31 mg/dL — ABNORMAL HIGH (ref 6–20)
BUN: 32 mg/dL — ABNORMAL HIGH (ref 6–20)
CO2: 24 mmol/L (ref 22–32)
CO2: 26 mmol/L (ref 22–32)
Calcium: 10.4 mg/dL — ABNORMAL HIGH (ref 8.9–10.3)
Calcium: 10.4 mg/dL — ABNORMAL HIGH (ref 8.9–10.3)
Chloride: 101 mmol/L (ref 98–111)
Chloride: 101 mmol/L (ref 98–111)
Creatinine, Ser: 1.97 mg/dL — ABNORMAL HIGH (ref 0.61–1.24)
Creatinine, Ser: 1.93 mg/dL — ABNORMAL HIGH (ref 0.61–1.24)
GFR, Estimated: 41 mL/min — ABNORMAL LOW (ref >60)
GFR, Estimated: 42 mL/min — ABNORMAL LOW (ref >60)
Glucose, Bld: 132 mg/dL — ABNORMAL HIGH (ref 70–99)
Glucose, Bld: 134 mg/dL — ABNORMAL HIGH (ref 70–99)
Phosphorus: 2 mg/dL — ABNORMAL LOW (ref 2.5–4.6)
Phosphorus: 2.8 mg/dL (ref 2.5–4.6)
Potassium: 3.7 mmol/L (ref 3.5–5.1)
Potassium: 3.9 mmol/L (ref 3.5–5.1)
Sodium: 133 mmol/L — ABNORMAL LOW (ref 135–145)
Sodium: 134 mmol/L — ABNORMAL LOW (ref 135–145)

## 2023-07-10 LAB — CBC
HCT: 28.1 % — ABNORMAL LOW (ref 39.0–52.0)
Hemoglobin: 8.4 g/dL — ABNORMAL LOW (ref 13.0–17.0)
MCH: 32.3 pg (ref 26.0–34.0)
MCHC: 29.9 g/dL — ABNORMAL LOW (ref 30.0–36.0)
MCV: 108.1 fL — ABNORMAL HIGH (ref 80.0–100.0)
Platelets: 156 10*3/uL (ref 150–400)
RBC: 2.6 MIL/uL — ABNORMAL LOW (ref 4.22–5.81)
RDW: 22.5 % — ABNORMAL HIGH (ref 11.5–15.5)
WBC: 6.5 10*3/uL (ref 4.0–10.5)
nRBC: 1.5 % — ABNORMAL HIGH (ref 0.0–0.2)

## 2023-07-10 LAB — POCT I-STAT 7, (LYTES, BLD GAS, ICA,H+H)
Acid-base deficit: 1 mmol/L (ref 0.0–2.0)
Bicarbonate: 26.2 mmol/L (ref 20.0–28.0)
Calcium, Ion: 1.44 mmol/L — ABNORMAL HIGH (ref 1.15–1.40)
HCT: 50 % (ref 39.0–52.0)
Hemoglobin: 17 g/dL (ref 13.0–17.0)
O2 Saturation: 99 %
Patient temperature: 97.8
Potassium: 4 mmol/L (ref 3.5–5.1)
Sodium: 137 mmol/L (ref 135–145)
TCO2: 28 mmol/L (ref 22–32)
pCO2 arterial: 50.6 mmHg — ABNORMAL HIGH (ref 32–48)
pH, Arterial: 7.321 — ABNORMAL LOW (ref 7.35–7.45)
pO2, Arterial: 127 mmHg — ABNORMAL HIGH (ref 83–108)

## 2023-07-10 LAB — GLUCOSE, CAPILLARY
Glucose-Capillary: 128 mg/dL — ABNORMAL HIGH (ref 70–99)
Glucose-Capillary: 121 mg/dL — ABNORMAL HIGH (ref 70–99)
Glucose-Capillary: 115 mg/dL — ABNORMAL HIGH (ref 70–99)
Glucose-Capillary: 135 mg/dL — ABNORMAL HIGH (ref 70–99)
Glucose-Capillary: 134 mg/dL — ABNORMAL HIGH (ref 70–99)

## 2023-07-10 LAB — COOXEMETRY PANEL
Carboxyhemoglobin: 3.3 % — ABNORMAL HIGH (ref 0.5–1.5)
Methemoglobin: 1 % (ref 0.0–1.5)
O2 Saturation: 78.5 %
Total hemoglobin: 9 g/dL — ABNORMAL LOW (ref 12.0–16.0)

## 2023-07-10 MED ORDER — SIMETHICONE 40 MG/0.6ML PO SUSP
40.0000 mg | Freq: Four times a day (QID) | ORAL | Status: DC | PRN
  Administered 2023-07-15 – 2023-07-30 (×10): 40 mg
  Filled 2023-07-10 (×12): qty 0.6

## 2023-07-10 MED ORDER — ACETAMINOPHEN 325 MG PO TABS
650.0000 mg | ORAL_TABLET | ORAL | Status: DC | PRN
  Administered 2023-07-11 – 2023-07-20 (×19): 650 mg
  Filled 2023-07-10 (×19): qty 2

## 2023-07-10 MED ORDER — LIDOCAINE 5 % EX PTCH
1.0000 | MEDICATED_PATCH | CUTANEOUS | Status: DC
  Administered 2023-07-10 – 2023-07-12 (×3): 1 via TRANSDERMAL
  Filled 2023-07-10 (×4): qty 1

## 2023-07-10 MED ORDER — LIDOCAINE 5 % EX PTCH
1.0000 | MEDICATED_PATCH | CUTANEOUS | Status: DC
  Administered 2023-07-10: 1 via TRANSDERMAL
  Filled 2023-07-10: qty 1

## 2023-07-10 MED ORDER — OXYCODONE HCL 5 MG/5ML PO SOLN
2.5000 mg | ORAL | Status: DC | PRN
  Administered 2023-07-10 – 2023-07-30 (×30): 2.5 mg
  Filled 2023-07-10 (×33): qty 5

## 2023-07-10 MED ORDER — K PHOS MONO-SOD PHOS DI & MONO 155-852-130 MG PO TABS
250.0000 mg | ORAL_TABLET | Freq: Once | ORAL | Status: AC
  Administered 2023-07-10: 250 mg
  Filled 2023-07-10: qty 1

## 2023-07-10 MED ORDER — VITAL 1.5 CAL PO LIQD
1000.0000 mL | ORAL | Status: DC
  Administered 2023-07-11: 1000 mL

## 2023-07-10 NOTE — Progress Notes (Signed)
Advanced Heart Failure Rounding Note  PCP-Cardiologist: Reatha Harps, MD   Subjective:   8/15: Elective triple vale surgery + MAZE. Underwent aortic valve replacement + mitral and tricuspid valve repairs by Dr. Milinda Antis 8/22: AHF consulted for high-output HF with persistent post-op shock 8/25: CRRT stopped 8/26: CRRT restarted  08/27: S/p TTVP Echo: EF 70-75%, severe LVH, RV mod reduced, RV severely enlarged. LA/RA mod dilated. Large pericardial effusion. No evidence of tamponade.    Currently on 10 Epi + 7 NE + 0.04 Vaso + 5 DBA. Midodrine 15 tid CO-OX 79% (poss sepsis + AV fistula).  Remains on CVVHD. Was pulling -50 overnight. CVP 12  Back on TFs  More alert today. On Bipap. C/o back pain    Objective:    Weight Range: 78.9 kg Body mass index is 21.74 kg/m.   Vital Signs:   Temp:  [97.6 F (36.4 C)-98.2 F (36.8 C)] 97.7 F (36.5 C) (09/02 0400) Pulse Rate:  [77-86] 80 (09/02 0930) Resp:  [11-39] 27 (09/02 0930) BP: (102-121)/(41-52) 102/42 (09/01 1515) SpO2:  [90 %-100 %] 99 % (09/02 0930) Arterial Line BP: (84-118)/(32-53) 100/38 (09/02 0930) FiO2 (%):  [30 %-40 %] 30 % (09/02 0800) Weight:  [78.9 kg] 78.9 kg (09/02 0500) Last BM Date : 07/08/23  Weight change: Filed Weights   07/08/23 0700 07/09/23 0500 07/10/23 0500  Weight: 76.8 kg 78.6 kg 78.9 kg   Intake/Output:   Intake/Output Summary (Last 24 hours) at 07/10/2023 1002 Last data filed at 07/10/2023 0903 Gross per 24 hour  Intake 3703.75 ml  Output 3864.93 ml  Net -161.18 ml    Physical Exam   General:  Ill appearing. Weak. On bipap HEENT: normal + Cor-trak Neck: supple. JVP to jaw  Carotids 2+ bilat; no bruits. No lymphadenopathy or thryomegaly appreciated. Cor: PMI nondisplaced. Regular rate & rhythm. No rubs, gallops or murmurs. Lungs: clear Abdomen: soft, nontender, nondistended. No hepatosplenomegaly. No bruits or masses. Good bowel sounds. Extremities: no cyanosis, clubbing, rash,  edema Neuro: alert & orientedx3, cranial nerves grossly intact. moves all 4 extremities w/o difficulty. Affect pleasant   Telemetry   V paced 80. Underneath sinus with variable PR 50-60s Personally reviewed    Labs    CBC Recent Labs    07/09/23 0356 07/09/23 0848 07/10/23 0319  WBC 6.6  --  6.5  HGB 7.0* 9.2* 8.4*  HCT 23.9* 27.0* 28.1*  MCV 111.7*  --  108.1*  PLT 164  --  156   Basic Metabolic Panel Recent Labs    54/09/81 0356 07/09/23 0848 07/09/23 1611 07/10/23 0319  NA 133*   < > 133* 133*  K 3.8   < > 4.1 3.7  CL 99  --  101 101  CO2 26  --  26 24  GLUCOSE 136*  --  139* 132*  BUN 26*  --  26* 31*  CREATININE 1.93*  --  1.83* 1.97*  CALCIUM 10.1  --  10.1 10.4*  MG 2.6*  --   --  2.5*  PHOS 2.7  --  2.8 2.0*   < > = values in this interval not displayed.   Liver Function Tests Recent Labs    07/09/23 1611 07/10/23 0319  ALBUMIN 2.4* 2.4*   No results for input(s): "LIPASE", "AMYLASE" in the last 72 hours. Cardiac Enzymes No results for input(s): "CKTOTAL", "CKMB", "CKMBINDEX", "TROPONINI" in the last 72 hours.  BNP: BNP (last 3 results) No results for input(s): "BNP" in  the last 8760 hours.  ProBNP (last 3 results) No results for input(s): "PROBNP" in the last 8760 hours.   D-Dimer No results for input(s): "DDIMER" in the last 72 hours. Hemoglobin A1C No results for input(s): "HGBA1C" in the last 72 hours. Fasting Lipid Panel No results for input(s): "CHOL", "HDL", "LDLCALC", "TRIG", "CHOLHDL", "LDLDIRECT" in the last 72 hours. Thyroid Function Tests No results for input(s): "TSH", "T4TOTAL", "T3FREE", "THYROIDAB" in the last 72 hours.  Invalid input(s): "FREET3"  Other results:   Imaging   ECHOCARDIOGRAM LIMITED  Result Date: 07/09/2023    ECHOCARDIOGRAM LIMITED REPORT   Patient Name:   [name] Date of Exam: 07/09/2023 Medical Rec #:  [id number]  Height:       75.0 in Accession #:    [id number] Weight:       173.3 lb Date of  Birth:  Nov 26, 1970  BSA:          2.065 m Patient Age:    52 years   BP:           110/43 mmHg Patient Gender: M          HR:           80 bpm. Exam Location:  Inpatient Procedure: Limited Echo and Cardiac Doppler Indications:    Pericardial Effusion  History:        Patient has prior history of Echocardiogram examinations, most                 recent 07/06/2023. CHF, Prior Cardiac Surgery, CKD; Aortic Valve                 Disease, Mitral Valve Disease and s/p AV replacement, MV and TV                 repair, and Maze procedure.  Sonographer:    [facility] Referring Phys: [id number] Lorin Glass  Sonographer Comments: Patient had extensive bandaging to subcostal window. IMPRESSIONS  1. Left ventricular ejection fraction, by estimation, is 60 to 65%. The left ventricle has normal function. There is severe left ventricular hypertrophy. There is the interventricular septum is flattened in diastole ('D' shaped left ventricle), consistent with right ventricular volume overload.  2. Right ventricular systolic function is moderately reduced. The right ventricular size is severely enlarged.  3. Moderate pericardial effusion. There is no evidence of cardiac tamponade.  4. S/p AVR.  5. The inferior vena cava is dilated in size with <50% respiratory variability, suggesting right atrial pressure of 15 mmHg. Comparison(s): No significant change from prior study. FINDINGS  Left Ventricle: Left ventricular ejection fraction, by estimation, is 60 to 65%. The left ventricle has normal function. There is severe left ventricular hypertrophy. The interventricular septum is flattened in diastole ('D' shaped left ventricle), consistent with right ventricular volume overload. Right Ventricle: The right ventricular size is severely enlarged. Right ventricular systolic function is moderately reduced. Pericardium: A moderately sized pericardial effusion is present. There is no evidence of cardiac tamponade. Aortic Valve: S/p AVR. Venous: The inferior  vena cava is dilated in size with less than 50% respiratory variability, suggesting right atrial pressure of 15 mmHg. Additional Comments: Spectral Doppler performed.  Carolan Clines Electronically signed by Carolan Clines Signature Date/Time: 07/09/2023/4:40:18 PM    Final     Medications:     Scheduled Medications:  acyclovir  200 mg Per Tube BID   aspirin  81 mg Per Tube Daily   atorvastatin  10 mg Per Tube QHS  Chlorhexidine Gluconate Cloth  6 each Topical Daily   colchicine  0.3 mg Per Tube Once every 5 days   [START ON 07/11/2023] darbepoetin (ARANESP) injection - DIALYSIS  150 mcg Subcutaneous Q Tue-1800   docusate  100 mg Per Tube BID   escitalopram  10 mg Per Tube Daily   feeding supplement (PROSource TF20)  60 mL Per Tube Q4H   folic acid  1 mg Intravenous Daily   guaiFENesin  15 mL Per Tube BID   heparin injection (subcutaneous)  5,000 Units Subcutaneous Q8H   insulin aspart  0-24 Units Subcutaneous Q4H   lidocaine  1 patch Transdermal Q24H   lidocaine-prilocaine  1 Application Topical Q M,W,F   metoCLOPramide (REGLAN) injection  5 mg Intravenous Q8H   midodrine  15 mg Per Tube TID   multivitamin  1 tablet Per Tube BID   mouth rinse  15 mL Mouth Rinse 4 times per day   pantoprazole (PROTONIX) IV  40 mg Intravenous Q12H   phosphorus  250 mg Per Tube Once   polyethylene glycol  17 g Per Tube Daily   QUEtiapine  25 mg Per Tube QHS   sodium chloride flush  3 mL Intravenous Q12H   thiamine (VITAMIN B1) injection  100 mg Intravenous Daily    Infusions:   prismasol BGK 4/2.5 400 mL/hr at 07/09/23 2214    prismasol BGK 4/2.5 400 mL/hr at 07/09/23 2204   sodium chloride 10 mL/hr at 07/10/23 0900   sodium chloride 10 mL/hr at 07/10/23 0900   albumin human Stopped (06/23/23 0040)   DOBUTamine 5 mcg/kg/min (07/10/23 0900)   epinephrine 10 mcg/min (07/10/23 0900)   feeding supplement (VITAL 1.5 CAL)     norepinephrine (LEVOPHED) Adult infusion 7 mcg/min (07/10/23 0900)   prismasol BGK  4/2.5 1,800 mL/hr at 07/10/23 0859   vasopressin 0.04 Units/min (07/10/23 0900)    PRN Medications: sodium chloride, sodium chloride, acetaminophen, albumin human, bisacodyl **OR** bisacodyl, clonazePAM, heparin, ondansetron (ZOFRAN) IV, mouth rinse, oxyCODONE, phenol, simethicone, sodium chloride flush, traMADol  Patient Profile   52 y/o AAM w/ mildly reduced systolic heart failure (EF once 45-50% in 2013 but normal on subsequent echos) valvular heart disease including severe AS, 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 EF 55-60% w/ mild RV dysfunction by echo in 6/24.    Admitted for elective triple vale surgery + MAZE. S/p bioprosthetic aortic valve replacement + mitral and tricuspid valve repairs. Post-op course c/b persistent vasoplagic syndrome.   Assessment/Plan  1. Post-op Vasopalegia  - Echo 06/29/23 EF 60-65%, no RWMA, small pericardial effusion present, MV repaired/replaced, trivial MR. TR repaired/replaced, mild TR. AV repaired/replaced.  - Echo 07/04/23: EF 70-75%, severe concentric LVH, RV mod reduced, RV severely enlarged. LA/RA mod dilated. Large pericardial effusion. No evidence of tamponade - Echo 08/29: Moderate pericardial effusion, no tamponade - Suspected high output HF 2/2 AV fistula  - POD #17 remains dependent on high-dose pressors - PCT elevated but in setting of ESRD. Completed Zosyn for possible septic component - CVP 142 Targeting CVP 12-13 d/t severe RV failure  - Keeping even to -50 on CVVHD. Has not tolerated volume removal well. - We still are not making any meaningful progress. Palliative Care remains involved. Need to try to wean pressors. If unable to wean then need to consider comfort care.    2.  Acute  on Chronic Systolic Heart Failure w/ Prominent RV Dysfunction  - LVEF once 45-50% in 2013 but normal on  subsequent echos. Pre-op TTE 6/25 EF 55-60%, GIIIDD (Restrictive) RV mildly reduced, mod PH w/ estimated RVSP 48 mmHg  - Intra-op TEE (while on 4 mcg of Epi) LVEF 45%, RV mod-severely reduced, stable AV prosthesis w/ normal gradient and stable valvuloplasty rings w/ mod residual MS, mean gradient 7 mmgH.   - echo 07/04/23: EF 70-75%, severe concentric LVH, RV mod reduced, RV severely enlarged. LA/RA mod dilated. Large pericardial effusion. No evidence of tamponade.  - Echo 08/29: EF 65-70%, RV moderately reduced, pericardial effusion slightly smaller, no tamponade - Midodrine 15 mg TID - Inotropes/pressors and volume management as above. Continue to wean as toelrated  3. 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 Post-op CHB  - EP following. - S/p RIJ temp wire 08/27 - EPW left in place d/t concern for risk of tamponade - V paced 80. Underneath sinus with variable PR 50-60s. BP optimized with VPacing at 85-90   5. 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 Nephrology following - Plan as above   6. H/o Atrial Fibrillation  - S/p MAZE    7. Thrombocytopenia  Anemia - Thrombocytopenia resolved - Hgb 8.4 today  8. ID: ?sepsis component  - Completed Zosyn per CCM.  - PCT elevated in setting of ESRD  9. Large pericardial effusion - seen on echo 07/04/23. Previously small on echo 06/29/23 - no evidence of tamponade - Limited echo 08/29 stable  I am increasingly concerned that we are not going to have RV recovery. Pressor requirements have remained very high and he had trouble tolerating CVVH  Palliative Care on board. Appreciate their assistance  CRITICAL CARE Performed by: Arvilla Meres  Total critical care time: 35 minutes  Critical care  time was exclusive of separately billable procedures and treating other patients.  Critical care was necessary to treat or prevent imminent or life-threatening deterioration.  Critical care was time spent personally by me (independent of midlevel providers or residents) on the following activities: development of treatment plan with patient and/or surrogate as well as nursing, discussions with consultants, evaluation of patient's response to treatment, examination of patient, obtaining history from patient or surrogate, ordering and performing treatments and interventions, ordering and review of laboratory studies, ordering and review of radiographic studies, pulse oximetry and re-evaluation of patient's condition.   Length of Stay: 8  Arvilla Meres, MD  07/10/2023, 10:02 AM  Advanced Heart Failure Team Pager [phone] (M-F; 7a - 5p)  Please contact [facility] for night-coverage after hours (5p -7a ) and weekends on [email or web address]

## 2023-07-10 NOTE — Progress Notes (Signed)
Inpatient Rehab Admissions Coordinator:   Pt. Continues to be medically unstable and with poor tolerance for therapies. I will not pursue CIR at this time but will follow for progress and participation with therapies and for medical readiness.   Megan Salon, MS, CCC-SLP Rehab Admissions Coordinator  [phone] (celll) [phone] (office)

## 2023-07-10 NOTE — Progress Notes (Signed)
eLink Physician-Brief Progress Note Patient Name: [name] DOB: 1971-09-06 MRN: [id number]   Date of Service  07/10/2023  HPI/Events of Note  Patient reports right chest pain that is tender with palpation. Gradually getting worse this morning. Heating pad not providing relief  Pain ranked  5 out of 10  eICU Interventions  Lidocaine patch ordered     Intervention Category Minor Interventions: Agitation / anxiety - evaluation and management  Sibel Khurana Mechele Collin 07/10/2023, 6:14 AM

## 2023-07-10 NOTE — Progress Notes (Signed)
   07/10/23 0130  BiPAP/CPAP/SIPAP  BiPAP/CPAP/SIPAP Pt Type Adult  BiPAP/CPAP/SIPAP Resmed  Reason BIPAP/CPAP not in use  (Home Unit)  Mask Type Nasal pillows  Mask Size Small  Patient Home Equipment Yes  BiPAP/CPAP /SiPAP Vitals  Resp (!) 24  MEWS Score/Color  MEWS Score 1  MEWS Score Color Green   Patient placed on Home Unit(Resmed) . Tried to place patient on BiPAP, but the patient did not tolerate it very well, so the patient was placed back on his home unit. Pt resting comfortably at this time.

## 2023-07-10 NOTE — Progress Notes (Signed)
   07/10/23 2023  BiPAP/CPAP/SIPAP  $ Non-Invasive Ventilator  Non-Invasive Vent Subsequent  BiPAP/CPAP/SIPAP Pt Type Adult  BiPAP/CPAP/SIPAP V60  Mask Type Full face mask  Mask Size Medium  Set Rate 10 breaths/min  Respiratory Rate 19 breaths/min  IPAP 10 cmH20  EPAP 5 cmH2O  PEEP 5 cmH20  FiO2 (%) 30 %  Minute Ventilation 9.5  Leak 46  Peak Inspiratory Pressure (PIP) 11  Tidal Volume (Vt) 500  Patient Home Equipment No  Auto Titrate No  Press High Alarm 25 cmH2O  Press Low Alarm 5 cmH2O  BiPAP/CPAP /SiPAP Vitals  Pulse Rate 85  Resp 19  SpO2 98 %  Bilateral Breath Sounds Clear;Diminished  MEWS Score/Color  MEWS Score 0  MEWS Score Color [place] Si

## 2023-07-10 NOTE — Progress Notes (Addendum)
[facility] Progress Note  Subjective: I/O net = 4.6L in and 4.4 L out = net + 150 cc. Pt seen in room. On bipap now.    Vitals:   07/10/23 1630 07/10/23 1645 07/10/23 1700 07/10/23 1715  BP:      Pulse: 85 85 85 86  Resp: (!) 24 18 20 17   Temp:      TempSrc:      SpO2: 100% 99% 100% 98%  Weight:      Height:        Physical Exam:       General adult male in bed in no acute distress - critically ill  Lungs clear but reduced to auscultation bilaterally on 4 liters nasal cannula   Heart S1S2 Abdomen soft nontender nondistended Extremities no edema appreciated Neuro - resting and awakens more easily; Oriented to person and year. Provides some history and answers some questions   Access RUE AVF with bruit and thrill; left internal jugular nontunneled dialysis catheter    OP HD: [place] triad MWF nocturnal 3h    F250     94kg   2K/2.5 Ca bath   RUE AVF    Summary: [name] is a/an 52 y.o. male with a past medical history notable for ESRD on HD admitted with severe MR and TR and aortic stenosis s/p repair/replacement.    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  Per cardiothoracic surgery  # Complete heart block - post-op, per EP/ cardiology   # ESRD - pt was too hypotensive for hemodialysis post-op.  CRRT started 8/16 and still remains on CRRT (brief break 8/25- 8/26). Current UF goal is keep even as tolerated.    # Distributive Shock postoperatively - remains on 3 pressors + dobutamine gtt and po midodrine.  Fluid volume overload is improved. Recent wts are 77- 79kg (dry wt was 94kg, admit wt 95kg). CVP 10- 15 today.   # Pericardial effusion - per CT surgery and CHF.  We are optimizing volume status with CRRT as above.  Assessed as moderate on last TTE   # Anemia of ESRD - transfuse as needed per primary team. Concurrent thrombocytopenia improved.  Aranesp increased  to 150 mcg weekly on Tuesdays    # MBD ckd - hypercalcemia.  Note he was transitioned off of nepro after starting back on CRRT.  Phos acceptable.  No binders currently as on CRRT - normally on renvela.  Check intact PTH.  May need sensipar.    # Vascular access - RUE AVF had been functioning well but have been unable to use due to shock and CRRT requirement.  Temporary dialysis catheter placed for CRRT.  [name]  MD  [facility] 07/10/2023, 5:29 PM  Recent Labs  Lab 07/10/23 0319 07/10/23 1604 07/10/23 1629  HGB 8.4*  --  17.0  ALBUMIN 2.4* 2.5*  --   CALCIUM 10.4* 10.4*  --   PHOS 2.0* 2.8  --   CREATININE 1.97* 1.93*  --   K 3.7 3.9 4.0    Inpatient medications:  acyclovir  200 mg Per Tube BID   aspirin  81 mg Per Tube Daily   atorvastatin  10 mg Per Tube QHS   Chlorhexidine Gluconate Cloth  6 each Topical Daily   colchicine  0.3 mg Per Tube Once every 5 days   [START ON 07/11/2023] darbepoetin (  ARANESP) injection - DIALYSIS  150 mcg Subcutaneous Q Tue-1800   docusate  100 mg Per Tube BID   escitalopram  10 mg Per Tube Daily   feeding supplement (PROSource TF20)  60 mL Per Tube Q4H   folic acid  1 mg Intravenous Daily   guaiFENesin  15 mL Per Tube BID   heparin injection (subcutaneous)  5,000 Units Subcutaneous Q8H   insulin aspart  0-24 Units Subcutaneous Q4H   lidocaine  1 patch Transdermal Q24H   lidocaine-prilocaine  1 Application Topical Q M,W,F   metoCLOPramide (REGLAN) injection  5 mg Intravenous Q8H   midodrine  15 mg Per Tube TID   multivitamin  1 tablet Per Tube BID   mouth rinse  15 mL Mouth Rinse 4 times per day   pantoprazole (PROTONIX) IV  40 mg Intravenous Q12H   polyethylene glycol  17 g Per Tube Daily   QUEtiapine  25 mg Per Tube QHS   sodium chloride flush  3 mL Intravenous Q12H   thiamine (VITAMIN B1) injection  100 mg Intravenous Daily     prismasol BGK 4/2.5 400 mL/hr at 07/10/23 1220    prismasol BGK 4/2.5 400 mL/hr at 07/10/23 1219   sodium chloride  10 mL/hr at 07/10/23 1700   sodium chloride 10 mL/hr at 07/10/23 1700   albumin human Stopped (06/23/23 0040)   DOBUTamine 5 mcg/kg/min (07/10/23 1700)   epinephrine 10 mcg/min (07/10/23 1700)   feeding supplement (VITAL 1.5 CAL) 35 mL/hr at 07/10/23 1700   norepinephrine (LEVOPHED) Adult infusion 10 mcg/min (07/10/23 1700)   prismasol BGK 4/2.5 1,800 mL/hr at 07/10/23 1603   vasopressin 0.04 Units/min (07/10/23 1700)   sodium chloride, sodium chloride, acetaminophen, albumin human, bisacodyl **OR** bisacodyl, clonazePAM, heparin, ondansetron (ZOFRAN) IV, mouth rinse, oxyCODONE, phenol, simethicone, sodium chloride flush, traMADol

## 2023-07-10 NOTE — Progress Notes (Signed)
SLP Cancellation Note  Patient Details Name: [name] MRN: [id number] DOB: 1971-02-08   Cancelled treatment:       Reason Eval/Treat Not Completed: Patient not medically ready   Shylie Polo, Riley Nearing 07/10/2023, 11:10 AM

## 2023-07-10 NOTE — Progress Notes (Signed)
eLink Physician-Brief Progress Note Patient Name: [name] DOB: Nov 16, 1970 MRN: [id number]   Date of Service  07/10/2023  HPI/Events of Note  Patient has dialysis line in place and continues to have blood draw through all lumens but is now having difficulty initiating RRT.  Due to time initiates, he is having a negative pressure alarm and is unable to initiate.  Attempted repositioning with ongoing difficulty.  eICU Interventions  For now, latest labs reviewed with no evidence of any acute dialysis need.  CVP has been in the low teens, rechecked and still below 15.  May need dialysis line replaced-can wait until the morning for the team's to evaluate.     Intervention Category Minor Interventions: Clinical assessment - ordering diagnostic tests  Maurissa Ambrose 07/10/2023, 11:45 PM

## 2023-07-10 NOTE — Progress Notes (Signed)
Patient ID: [name], male   DOB: 1971/09/15, 52 y.o.   MRN: [id number] TCTS Evening Rounds:  Hemodynamics unchanged. Had to titrate NE up a little today from 6 to 10 but that seems to be a daily occurrence and then able to wean down again. Other inotropes/vasopressors the same.  CRRT to keep even.  Had ABG this afternoon with PCO2 50.6 and put on bipap by CCM.

## 2023-07-10 NOTE — Progress Notes (Signed)
   Palliative Medicine Inpatient Follow Up Note HPI: 52 y.o. male with past medical history of ESRD on HD 20 year s/p failed renal transplant in 1999 and 2015, moderate pulmonary HTN, severe MR, TR, and severe AS s/p repairs, anxiety, depression, bipolar disorder, CAD, afib s/p MAZE admitted on 06/22/2023 with fatigue and DOE due to valvular disease.   Today's Discussion 07/10/2023  *Please note that this is a verbal dictation therefore any spelling or grammatical errors are due to the "Dragon Medical One" system interpretation.  Chart reviewed inclusive of vital signs, progress notes, laboratory results, and diagnostic images. Increased pressor requirements. CRRT tolerance troublesome.   I met at bedside with [name] this afternoon in the presence of his RN, [name]. He has been on bipap for his acidosis. Has had intermittent episodes of shortness of breath despite fairly good oxygenation.   [name] is aware that we will communicate with his family and attempt to set up a in person meeting once Dr. Leafy Ro returns, tomorrow.    Goals of the patient remain for full scope of care.  Questions and concerns addressed/Palliative Support Provided.  ____________________________ Addendum:  Shortly after leaving the room patients RN, [name] shares that [name] has requested Palliative care is no longer involved in his care.   I sent a secure chat to Dr. Laneta Simmers that we will sign off though are happy to re-engage if/when needed.   Objective Assessment: Vital Signs Vitals:   07/10/23 1215 07/10/23 1230  BP:    Pulse: 85 85  Resp: 16 (!) 23  Temp:    SpO2: 99% 99%    Intake/Output Summary (Last 24 hours) at 07/10/2023 1256 Last data filed at 07/10/2023 1200 Gross per 24 hour  Intake 3351.88 ml  Output 3428.63 ml  Net -76.75 ml   Last Weight  Most recent update: 07/10/2023  5:58 AM    Weight  78.9 kg (173 lb 15.1 oz)            Gen:  Middle aged AA M in distress HEENT: Coretrack in place, Dry  mucous membranes CV: Regular rate and rhythm  PULM: On BIpap ABD: soft/nontender  EXT: No edema  Neuro: Alert and oriented x3 - can write on paper  SUMMARY OF RECOMMENDATIONS   Full Code / Full scope of care  Per patient request he has asked that the PMT  not be involved in his care  PMT will sign off, though can be contacted if patient has a change of heart or additional support is needed  Billing based on MDM: Moderate ______________________________________________________________________________________ [name] [place] Palliative Medicine Team Team Cell Phone: [phone] Please utilize secure chat with additional questions, if there is no response within 30 minutes please call the above phone number  Palliative Medicine Team providers are available by phone from 7am to 7pm daily and can be reached through the team cell phone.  Should this patient require assistance outside of these hours, please call the patient's attending physician.

## 2023-07-10 NOTE — Progress Notes (Signed)
6 Days Post-Op Procedure(s) (LRB): TEMPORARY PACEMAKER (N/A) Subjective: Complains of back pain from laying in bed.  Has been relatively stable with titration of NE up and down a little.  Dobut 5, epi 10, NE 7, vaso 0.04, midodrine 15 tid. Co-ox 78.5.  CVP 8  Objective: Vital signs in last 24 hours: Temp:  [97.6 F (36.4 C)-98.2 F (36.8 C)] 97.7 F (36.5 C) (09/02 0400) Pulse Rate:  [77-86] 80 (09/02 0900) Cardiac Rhythm: A-V Sequential paced (09/02 0800) Resp:  [11-39] 16 (09/02 0900) BP: (102-121)/(41-52) 102/42 (09/01 1515) SpO2:  [90 %-100 %] 98 % (09/02 0900) Arterial Line BP: (84-118)/(32-53) 93/38 (09/02 0900) FiO2 (%):  [30 %-40 %] 30 % (09/02 0800) Weight:  [78.9 kg] 78.9 kg (09/02 0500)  Hemodynamic parameters for last 24 hours: CVP:  [0 mmHg-45 mmHg] 10 mmHg  Intake/Output from previous day: 09/01 0701 - 09/02 0700 In: 4153.2 [I.V.:1892.4; Blood:780.8; NG/GT:1420] Out: 4367.7 [Stool:435] Intake/Output this shift: Total I/O In: 364.3 [I.V.:184.3; NG/GT:180] Out: 375.2   General appearance: more alert and cooperative Neurologic: intact Heart: regular rate and rhythm, S1, S2 normal, no murmur Lungs: clear to auscultation bilaterally Abdomen: soft, non-tender; bowel sounds normal Extremities: no edema Wound: incision looks good.  Lab Results: Recent Labs    07/09/23 0356 07/09/23 0848 07/10/23 0319  WBC 6.6  --  6.5  HGB 7.0* 9.2* 8.4*  HCT 23.9* 27.0* 28.1*  PLT 164  --  156   BMET:  Recent Labs    07/09/23 1611 07/10/23 0319  NA 133* 133*  K 4.1 3.7  CL 101 101  CO2 26 24  GLUCOSE 139* 132*  BUN 26* 31*  CREATININE 1.83* 1.97*  CALCIUM 10.1 10.4*    PT/INR: No results for input(s): "LABPROT", "INR" in the last 72 hours. ABG    Component Value Date/Time   PHART 7.274 (L) 07/09/2023 0848   HCO3 27.3 07/09/2023 0848   TCO2 29 07/09/2023 0848   ACIDBASEDEF 1.1 07/09/2023 0733   O2SAT 78.5 07/10/2023 0319   CBG (last 3)  Recent Labs     07/09/23 2335 07/10/23 0344 07/10/23 0805  GLUCAP 136* 128* 121*   Narrative & Impression  CLINICAL DATA:  Shortness of breath.  Ileus.   EXAM: PORTABLE CHEST 1 VIEW   COMPARISON:  Radiographs 07/04/2023 and 07/03/2023.  CT 06/20/2023.   FINDINGS: 0919 hours. Left IJ hemodialysis catheter is unchanged, projecting over the mid SVC and directed laterally. A right subclavian central venous catheter terminates at the level of the mid SVC. Right IJ transvenous pacemaker projects over the right ventricular apex. Feeding tube projects below the diaphragm, tip visualized on abdominal radiographs.   Stable cardiomegaly post median sternotomy and valve replacement. There are persistent bilateral airspace opacities most consistent with edema. No pneumothorax or significant pleural effusion. The bones appear unchanged.   IMPRESSION: No significant change from prior study. Persistent bilateral airspace opacities most consistent with edema. Stable support system.     Electronically Signed   By: Carey Bullocks M.D.   On: 07/09/2023 13:31     Assessment/Plan:  Hemodynamics unchanged and still dependent on inotropes/vasopressors felt to be due to RV dysfunction. Limited echo yesterday showed LVEF of 60-65% with severe LVH, severe RV enlargement with moderately reduced function, moderate pericardial effusion without tamponade. It is not clear if he will benefit from ligation of his AV fistula and drainage of the pericardial effusion but he is not making any progress as is.  CRRT to keep even.  Tolerating tube feeds at 30 but goal is 60. Feeding tube tip was in the distal stomach on KUB yesterday. Looks like it wants to go into duodenum but may not be enough length to do so.    LOS: 18 days    Alleen Borne 07/10/2023

## 2023-07-10 NOTE — Progress Notes (Addendum)
NAME:  [name], MRN:  [id number], DOB:  11/21/1970, LOS: 18 ADMISSION DATE:  06/22/2023, CONSULTATION DATE: 06/22/2023 REFERRING MD: Leafy Ro - TCTS, CHIEF COMPLAINT: Postcardiac surgery  History of Present Illness:  This is a 52 year old gentleman, past medical history of anxiety depression, bipolar disease, congestive heart failure chronic diastolic heart failure kidney disease followed by renal transplant in Jul 26, 1998 repeat renal transplant in 26-Jul-2014.  Patient underwent surgery today for severe mitral regurgitation severe tricuspid regurgitation and moderate aortic stenosis.  He also has atrial fibrillation received maze procedure.  He had a complex mitral valve repair a tricuspid valve annuloplasty and aortic valve replacement followed by full right and left maze procedure.  Pertinent Medical History:   Past Medical History:  Diagnosis Date   ADHD (attention deficit hyperactivity disorder)    Anemia    Anemia, chronic disease 07/13/2011   Anxiety    Arthritis    Bipolar 2 disorder (HCC) 08/18/2011   Diagnosed in 07/26/2010.    Bipolar disorder (HCC)    Blood dyscrasia    Nephrotic Anemia   Blood transfusion    Blood transfusion without reported diagnosis    Chronic diastolic CHF (congestive heart failure) (HCC)    Complication of anesthesia    Woke up intubated gets combative  afraid to be alone    Congestive heart failure (CHF) (HCC) 07/13/2011   Onset July 26, 2005.  Followed by Dr. Eden Emms.  S/p cardiac catheterization in [facility] Dr. Anne Fu..  S/p cardiac catheterization in July 26, 2013 by Nishan.  Echo July 26, 2013.    Depression    Dysrhythmia    A. Fib 07-27-23 with DCCV in May 2024   Erectile dysfunction    ESRD on hemodialysis (HCC) 07/13/2011   Glomerulonephritis at age 39, started HD in 27-Jul-1995.  Deceased donor renal transplant 07-26-1998 at [facility], then kidney failed and went back on HD in 2005-07-26.  L forearm AVF . s/p repeat renal transplant 10/2014 [facility].   GI bleed 11/01/2011   Rectal bleeding with  emesis/diarrhea    Heart murmur    Hemodialysis status (HCC)    Hydrocele, right 11/25/2014   Hypertension    Hypertensive emergency 01/06/2013   Hypoglycemia 01/11/2013   Influenza-like illness 12/18/2013   Mild ascending aorta dilatation (HCC)    NICM (nonischemic cardiomyopathy) (HCC)    Pericardial effusion    a. Mod by echo in 2013-07-26, similar to prior.   Peripheral polyneuropathy 08/04/2016   Pneumonia    Pulmonary edema 01/06/2013   Pulmonary hypertension associated with end stage renal disease on dialysis [facility]) 07/13/2011   Renal transplant, status post 11/25/2014   Pt with Glomerulonephritis at age 62. Deceased donor renal transplant 07-26-1998 right and left renal transplant 10/2014.  Followed by [facility] transplant team; nephrologist is Dr. Lonna Cobb.    Respiratory failure with hypoxia (HCC) 01/06/2013   Shortness of breath 12/12/2012   Swollen testicle 07/13/2011   URI (upper respiratory infection) 01/25/2012    Significant Hospital Events: Including procedures, antibiotic start and stop dates in addition to other pertinent events   8/15 - AVR, MVR, TVR with TCTS ([place]). Remains on multiple pressors (NE, Epi, vaso). 8/16 - LIJ Trialysis catheter placed for CRRT.  8/17 extubated  8/18 - remains on CVVHD, 3 pressors  8/19-8/20 improving pressor needs 8/21 agitated delirium; cortrak 8/26 CRRT restarted 8/27 TVP placed, brady overnight and pacing turned to 80 8/30 palli consult  9/1 PRBC for low coox  9/2 coox down a bit again   Interim History / Subjective:  Coox down to 78 despite PRBC 9/1 for coox 81  On NE vaso epi DBA midodrine w SBP high 80s   Wants BiPAP PEEP reduced, doesn't like how its feeling Says his knee brace is in the closet   Objective:  Blood pressure (!) 102/42, pulse 80, temperature 97.7 F (36.5 C), temperature source Oral, resp. rate 16, height 6\' 3"  (1.905 m), weight 78.9 kg, SpO2 98%. CVP:  [0 mmHg-45 mmHg] 10 mmHg  FiO2 (%):  [30  %-40 %] 30 %   Intake/Output Summary (Last 24 hours) at 07/10/2023 0923 Last data filed at 07/10/2023 1610 Gross per 24 hour  Intake 4166.6 ml  Output 4191.33 ml  Net -24.73 ml   Filed Weights   07/08/23 0700 07/09/23 0500 07/10/23 0500  Weight: 76.8 kg 78.6 kg 78.9 kg   Physical Examination:    General: chronically and acutely ill, middle aged M NAD HEENT: BiPAP in place. Cortrak.  CV: paced rhythm. + murmur. Cap refill about 3 sec  Pulm: Symmetrical chest expansion, sounds obscured by BiPAP noises GI: soft non tender GU: defer MSK: no acute joint deformity Skin: c/d/w    Resolved issues:   Possible PNA.  Right pleural effusion - s/p pigtail chest tube with subsequent removal 8/25.   Assessment & Plan:   ICU delirium / encephalopathy   -delirium precautions  -q HS seroquel, lexapro   Acute resp failure w hypoxia -supporting w biPAP right now, wean as able  -think deconditioning is playing a large role + edema    MVR, TVR, AS s/p valvex3  CHB/ post op junctional rhythm s/p TVP (8/27) Shock Enlarging pericardial effusion  AoC HFrEF Afib sp MAZE  -post op distributive/vasoplegia, improved. Possible sepsis now off abx. Has RV failure. Pericardial effusion without tamponade phys  P -TCTS primary -EP following, eventually would need PPM. TVP for now and is pacer dependent  -SBP goal 95 -- NE epi vaso DBA and is on midodrine -unclear if plan for fistula occlusion RHC to eval RV (concern for high output HF) -unfortunately, if there is not RV recovery we may be approaching EOL despite multidisciplinary best efforts   Acute renal failure CKD s/p failed Rtxp (1999, 2015)  Hypophosphatemia Hyponatremia  Hypokalemia  Hypercalcemia  Hypermagnesemia  History of kidney disease status post renal transplant (failed 1999 and 2015). P -CRRT per nephro -- keeping even  -optimize lytes -- will order Kphos 9/2   Anemia - expected ABLA (resolved), critical illness, chronic  dz  P -follow CBC -- appropriate response after 1 PRBC 9/1   Moderate malnutrition  -slowly adv EN -- at 30/hr right now, can try to incr to 35 maybe 40  -empiric thiamine   Possible gout flare. - colchicine   Physical deconditioning  - PT/OT  GOC -palliative care has been engaged to talk with pt/family given concerns regarding lack of RV recovery so far.    Best Practice (right click and "Reselect all SmartList Selections" daily)   Diet/type:TF slowly advancing. Trying to incr from 30 to 35 9/2  DVT prophylaxis:heparin ppx GI prophylaxis: PPI Lines: Central line Foley:  No  Code Status:  full code Last date of multidisciplinary goals of care discussion [Per Primary Team]  CRITICAL CARE Performed by: Lanier Clam   Total critical care time: 37 minutes  Critical care time was exclusive of separately billable procedures and treating other patients. Critical care was necessary to treat or prevent imminent or life-threatening deterioration.  Critical care was time  spent personally by me on the following activities: development of treatment plan with patient and/or surrogate as well as nursing, discussions with consultants, evaluation of patient's response to treatment, examination of patient, obtaining history from patient or surrogate, ordering and performing treatments and interventions, ordering and review of laboratory studies, ordering and review of radiographic studies, pulse oximetry and re-evaluation of patient's condition.  Tessie Fass MSN, AGACNP-BC [facility] Pulmonary/Critical Care Medicine Amion for oager  07/10/2023, 9:23 AM

## 2023-07-11 DIAGNOSIS — I50811 Acute right heart failure: Secondary | ICD-10-CM | POA: Diagnosis not present

## 2023-07-11 DIAGNOSIS — I5023 Acute on chronic systolic (congestive) heart failure: Secondary | ICD-10-CM | POA: Diagnosis not present

## 2023-07-11 DIAGNOSIS — N186 End stage renal disease: Secondary | ICD-10-CM | POA: Diagnosis not present

## 2023-07-11 DIAGNOSIS — Z9889 Other specified postprocedural states: Secondary | ICD-10-CM | POA: Diagnosis not present

## 2023-07-11 DIAGNOSIS — G9340 Encephalopathy, unspecified: Secondary | ICD-10-CM | POA: Diagnosis not present

## 2023-07-11 DIAGNOSIS — J9601 Acute respiratory failure with hypoxia: Secondary | ICD-10-CM | POA: Diagnosis not present

## 2023-07-11 DIAGNOSIS — I442 Atrioventricular block, complete: Secondary | ICD-10-CM | POA: Diagnosis not present

## 2023-07-11 LAB — RENAL FUNCTION PANEL
Albumin: 2.3 g/dL — ABNORMAL LOW (ref 3.5–5.0)
Albumin: 2.4 g/dL — ABNORMAL LOW (ref 3.5–5.0)
Anion gap: 10 (ref 5–15)
Anion gap: 7 (ref 5–15)
BUN: 39 mg/dL — ABNORMAL HIGH (ref 6–20)
BUN: 43 mg/dL — ABNORMAL HIGH (ref 6–20)
CO2: 25 mmol/L (ref 22–32)
CO2: 22 mmol/L (ref 22–32)
Calcium: 10.7 mg/dL — ABNORMAL HIGH (ref 8.9–10.3)
Calcium: 10.6 mg/dL — ABNORMAL HIGH (ref 8.9–10.3)
Chloride: 99 mmol/L (ref 98–111)
Chloride: 102 mmol/L (ref 98–111)
Creatinine, Ser: 2.41 mg/dL — ABNORMAL HIGH (ref 0.61–1.24)
Creatinine, Ser: 2.85 mg/dL — ABNORMAL HIGH (ref 0.61–1.24)
GFR, Estimated: 32 mL/min — ABNORMAL LOW (ref >60)
GFR, Estimated: 26 mL/min — ABNORMAL LOW (ref >60)
Glucose, Bld: 145 mg/dL — ABNORMAL HIGH (ref 70–99)
Glucose, Bld: 126 mg/dL — ABNORMAL HIGH (ref 70–99)
Phosphorus: 3.5 mg/dL (ref 2.5–4.6)
Phosphorus: 3.9 mg/dL (ref 2.5–4.6)
Potassium: 4.1 mmol/L (ref 3.5–5.1)
Potassium: 4.2 mmol/L (ref 3.5–5.1)
Sodium: 134 mmol/L — ABNORMAL LOW (ref 135–145)
Sodium: 131 mmol/L — ABNORMAL LOW (ref 135–145)

## 2023-07-11 LAB — CBC
HCT: 26.3 % — ABNORMAL LOW (ref 39.0–52.0)
Hemoglobin: 7.8 g/dL — ABNORMAL LOW (ref 13.0–17.0)
MCH: 32 pg (ref 26.0–34.0)
MCHC: 29.7 g/dL — ABNORMAL LOW (ref 30.0–36.0)
MCV: 107.8 fL — ABNORMAL HIGH (ref 80.0–100.0)
Platelets: 151 10*3/uL (ref 150–400)
RBC: 2.44 MIL/uL — ABNORMAL LOW (ref 4.22–5.81)
RDW: 21.5 % — ABNORMAL HIGH (ref 11.5–15.5)
WBC: 5.5 10*3/uL (ref 4.0–10.5)
nRBC: 0.9 % — ABNORMAL HIGH (ref 0.0–0.2)

## 2023-07-11 LAB — POCT I-STAT 7, (LYTES, BLD GAS, ICA,H+H)
Acid-base deficit: 2 mmol/L (ref 0.0–2.0)
Acid-base deficit: 1 mmol/L (ref 0.0–2.0)
Acid-base deficit: 2 mmol/L (ref 0.0–2.0)
Bicarbonate: 25 mmol/L (ref 20.0–28.0)
Bicarbonate: 25.6 mmol/L (ref 20.0–28.0)
Bicarbonate: 24.7 mmol/L (ref 20.0–28.0)
Calcium, Ion: 1.56 mmol/L (ref 1.15–1.40)
Calcium, Ion: 1.59 mmol/L (ref 1.15–1.40)
Calcium, Ion: 1.59 mmol/L (ref 1.15–1.40)
HCT: 28 % — ABNORMAL LOW (ref 39.0–52.0)
HCT: 27 % — ABNORMAL LOW (ref 39.0–52.0)
HCT: 26 % — ABNORMAL LOW (ref 39.0–52.0)
Hemoglobin: 9.5 g/dL — ABNORMAL LOW (ref 13.0–17.0)
Hemoglobin: 9.2 g/dL — ABNORMAL LOW (ref 13.0–17.0)
Hemoglobin: 8.8 g/dL — ABNORMAL LOW (ref 13.0–17.0)
O2 Saturation: 98 %
O2 Saturation: 96 %
O2 Saturation: 97 %
Patient temperature: 98
Patient temperature: 98
Patient temperature: 98
Potassium: 4.4 mmol/L (ref 3.5–5.1)
Potassium: 4.4 mmol/L (ref 3.5–5.1)
Potassium: 4.7 mmol/L (ref 3.5–5.1)
Sodium: 137 mmol/L (ref 135–145)
Sodium: 137 mmol/L (ref 135–145)
Sodium: 137 mmol/L (ref 135–145)
TCO2: 26 mmol/L (ref 22–32)
TCO2: 27 mmol/L (ref 22–32)
TCO2: 26 mmol/L (ref 22–32)
pCO2 arterial: 49 mmHg — ABNORMAL HIGH (ref 32–48)
pCO2 arterial: 49.8 mmHg — ABNORMAL HIGH (ref 32–48)
pCO2 arterial: 48.2 mmHg — ABNORMAL HIGH (ref 32–48)
pH, Arterial: 7.313 — ABNORMAL LOW (ref 7.35–7.45)
pH, Arterial: 7.318 — ABNORMAL LOW (ref 7.35–7.45)
pH, Arterial: 7.317 — ABNORMAL LOW (ref 7.35–7.45)
pO2, Arterial: 119 mmHg — ABNORMAL HIGH (ref 83–108)
pO2, Arterial: 92 mmHg (ref 83–108)
pO2, Arterial: 103 mmHg (ref 83–108)

## 2023-07-11 LAB — GLUCOSE, CAPILLARY
Glucose-Capillary: 110 mg/dL — ABNORMAL HIGH (ref 70–99)
Glucose-Capillary: 119 mg/dL — ABNORMAL HIGH (ref 70–99)
Glucose-Capillary: 126 mg/dL — ABNORMAL HIGH (ref 70–99)
Glucose-Capillary: 129 mg/dL — ABNORMAL HIGH (ref 70–99)
Glucose-Capillary: 131 mg/dL — ABNORMAL HIGH (ref 70–99)
Glucose-Capillary: 139 mg/dL — ABNORMAL HIGH (ref 70–99)
Glucose-Capillary: 143 mg/dL — ABNORMAL HIGH (ref 70–99)

## 2023-07-11 LAB — COOXEMETRY PANEL
Carboxyhemoglobin: 1.8 % — ABNORMAL HIGH (ref 0.5–1.5)
Methemoglobin: <0.7 % (ref 0.0–1.5)
O2 Saturation: 81.1 %
Total hemoglobin: 8.5 g/dL — ABNORMAL LOW (ref 12.0–16.0)

## 2023-07-11 LAB — VITAMIN B1: Vitamin B1 (Thiamine): 191.3 nmol/L (ref 66.5–200.0)

## 2023-07-11 LAB — MAGNESIUM: Magnesium: 2.5 mg/dL — ABNORMAL HIGH (ref 1.7–2.4)

## 2023-07-11 MED ORDER — METOCLOPRAMIDE HCL 10 MG PO TABS: 5.0000 mg | ORAL_TABLET | Freq: Three times a day (TID) | ORAL | Status: DC

## 2023-07-11 MED ORDER — ORAL CARE MOUTH RINSE: 15.0000 mL | OROMUCOSAL | Status: DC | PRN

## 2023-07-11 MED ORDER — METOCLOPRAMIDE HCL 10 MG PO TABS
5.0000 mg | ORAL_TABLET | Freq: Three times a day (TID) | ORAL | Status: DC
  Administered 2023-07-11 – 2023-07-15 (×11): 5 mg
  Filled 2023-07-11 (×11): qty 1

## 2023-07-11 MED ORDER — ALTEPLASE 2 MG IJ SOLR
2.0000 mg | Freq: Once | INTRAMUSCULAR | Status: AC
  Administered 2023-07-11: 2 mg
  Filled 2023-07-11: qty 2

## 2023-07-11 MED ORDER — THIAMINE MONONITRATE 100 MG PO TABS
100.0000 mg | ORAL_TABLET | Freq: Every day | ORAL | Status: DC
  Administered 2023-07-12 – 2023-07-23 (×11): 100 mg via ORAL
  Filled 2023-07-11 (×11): qty 1

## 2023-07-11 MED ORDER — PIVOT 1.5 CAL PO LIQD
1000.0000 mL | ORAL | Status: DC
  Administered 2023-07-11 – 2023-07-12 (×2): 1000 mL

## 2023-07-11 MED ORDER — ORAL CARE MOUTH RINSE: 15.0000 mL | OROMUCOSAL | Status: DC

## 2023-07-11 MED ORDER — PROSOURCE TF20 ENFIT COMPATIBL EN LIQD
60.0000 mL | Freq: Every day | ENTERAL | Status: DC
  Filled 2023-07-11 (×2): qty 60

## 2023-07-11 MED ORDER — AZELASTINE HCL 0.1 % NA SOLN
2.0000 | Freq: Two times a day (BID) | NASAL | Status: DC
  Administered 2023-07-11 – 2023-07-31 (×39): 2 via NASAL
  Filled 2023-07-11: qty 30

## 2023-07-11 MED ORDER — CLONAZEPAM 0.25 MG PO TBDP: 0.2500 mg | ORAL_TABLET | Freq: Two times a day (BID) | ORAL | Status: DC | PRN

## 2023-07-11 MED ORDER — QUETIAPINE FUMARATE 25 MG PO TABS
12.5000 mg | ORAL_TABLET | Freq: Every day | ORAL | Status: DC
  Administered 2023-07-11 – 2023-07-17 (×7): 12.5 mg
  Filled 2023-07-11 (×7): qty 1

## 2023-07-11 MED ORDER — MIDODRINE HCL 5 MG PO TABS
20.0000 mg | ORAL_TABLET | Freq: Three times a day (TID) | ORAL | Status: DC
  Administered 2023-07-11 – 2023-07-13 (×6): 20 mg
  Filled 2023-07-11 (×7): qty 4

## 2023-07-11 MED ORDER — ORAL CARE MOUTH RINSE
15.0000 mL | OROMUCOSAL | Status: DC
  Administered 2023-07-12 (×3): 15 mL via OROMUCOSAL

## 2023-07-11 MED ORDER — SODIUM CHLORIDE 0.9 % IV SOLN: INTRAVENOUS | Status: DC

## 2023-07-11 NOTE — Progress Notes (Signed)
Family updated at bedside with Dr. Gala Romney. Planning on RHC with occlusion of RUE fistula tomorrow morning.   Steffanie Dunn, DO 07/11/23 12:23 PM [place]  For contact information, see Amion. If no response to pager, please call PCCM consult pager. After hours, 7PM- 7AM, please call Elink.

## 2023-07-11 NOTE — Progress Notes (Signed)
Rn rounded on pt for removal of tpa from all three lumen's in dialysis internal jugular line. Removed tpa with no issue & heparin locked blue/red lumen and saline locked purple lumen. Dressing not intact upon RN arrival and this RN reinforced. Spoke with primary RN who stated that two other dressing changes needed to be done so would change internal jugular HD catheter dressing at the same time later today.

## 2023-07-11 NOTE — Progress Notes (Addendum)
Occupational Therapy Treatment Patient Details Name: [name] MRN: [id number] DOB: 11/15/1970 Today's Date: 07/11/2023   History of present illness Pt is a 52 y.o. male admitted 06/22/23 for same day planned tricuspid valve repair, mitral valve repair, aortic valve replacement, maze, TEE. Post-op course complicated by CHB, shock, AKI, respiratory failure. CRRT initiated 8/16, then resumed 8/26. ETT 8/15-8/17. PMH includes CHF, HTN, NICM, polyneuropathy, ESRD on HD (s/p renal transplant 1999, 2015), bipolar, ADHD, anxiety, depression.   OT comments  Pt seen for OT session, RN requesting OT, states pt wanting to work with therapy today. Session limited to bed level activity only, as pt not yet stable for EOB/OOB per RN/order set. Pt able to complete bed level therex, limits to UE movement given precautions and multiple lines. Pt able to perform oral care and washing face at bed level during session. Provided pt with squeeze ball and pt able to demo use. Goals to be reassessed once pt stable for EOB/OOB, pt remains motivated to mobilize. Pt presenting with impairments listed below, will follow acutely. Patient will benefit from intensive inpatient follow up therapy, >3 hours/day to maximize safety/ind with ADLs/functional mobility.       If plan is discharge home, recommend the following:  Two people to help with walking and/or transfers;A lot of help with bathing/dressing/bathroom;Assistance with cooking/housework;Direct supervision/assist for financial management;Direct supervision/assist for medications management;Assist for transportation;Help with stairs or ramp for entrance   Equipment Recommendations  Other (comment) (defer)    Recommendations for Other Services PT consult    Precautions / Restrictions Precautions Precautions: Fall;Sternal Precaution Comments: CRRT- restarted 8/26, external pacer Restrictions Other Position/Activity Restrictions: sternal prec       Mobility Bed  Mobility               General bed mobility comments: deferred    Transfers                   General transfer comment: deferred     Balance                                           ADL either performed or assessed with clinical judgement   ADL Overall ADL's : Needs assistance/impaired     Grooming: Wash/dry face;Oral care;Bed level Grooming Details (indicate cue type and reason): performing oral care with suction toothbrush and able to wash face at bed level                                    Extremity/Trunk Assessment Upper Extremity Assessment Upper Extremity Assessment: Generalized weakness (within limits of sternal prec/CRRT/pacer)   Lower Extremity Assessment Lower Extremity Assessment: Defer to PT evaluation        Vision   Vision Assessment?: No apparent visual deficits   Perception Perception Perception: Not tested   Praxis Praxis Praxis: Not tested    Cognition Arousal: Alert Behavior During Therapy: [facility] for tasks assessed/performed Overall Cognitive Status: Within Functional Limits for tasks assessed                                 General Comments: eager to mobilize, explained reason for bed level vs OOB ax due to bed rest restriction  Exercises Exercises: General Upper Extremity, Other exercises General Exercises - Upper Extremity Shoulder Flexion: Supine, Both, 10 reps, AROM (limited, performed below 90* flexion) Elbow Flexion: Supine, Both, 10 reps, AROM Elbow Extension: AROM, Both, 10 reps, Supine Wrist Flexion: AROM, Both, 10 reps, Supine Wrist Extension: AROM, Both, 10 reps, Supine Digit Composite Flexion: AROM, Both, 10 reps, Supine Composite Extension: AROM, Both, 10 reps, Supine General Exercises - Lower Extremity Ankle Circles/Pumps: AROM, 10 reps, Both, Supine Short Arc Quad: AROM, Both, Supine, 10 reps Heel Slides: Both, AROM, 10 reps, Supine Hip  ABduction/ADduction: AROM, Both, 10 reps, Supine Other Exercises Other Exercises: glute squeeze x10 supine Other Exercises: squeeze ball x5 BUE    Shoulder Instructions       General Comments VSS, RN present for most of session    Pertinent Vitals/ Pain       Pain Assessment Pain Assessment: No/denies pain  Home Living                                          Prior Functioning/Environment              Frequency  Min 1X/week        Progress Toward Goals  OT Goals(current goals can now be found in the care plan section)  Progress towards OT goals: OT to reassess next treatment  Acute Rehab OT Goals Patient Stated Goal: none stated OT Goal Formulation: With patient Time For Goal Achievement: 07/25/23 Potential to Achieve Goals: Good ADL Goals Pt Will Perform Upper Body Dressing: with min assist;sitting Pt Will Perform Lower Body Dressing: with min assist;sit to/from stand;sitting/lateral leans Pt Will Transfer to Toilet: with min assist;bedside commode;squat pivot transfer;stand pivot transfer  Plan      Co-evaluation                 AM-PAC OT "6 Clicks" Daily Activity     Outcome Measure   Help from another person eating meals?: A Little Help from another person taking care of personal grooming?: A Little Help from another person toileting, which includes using toliet, bedpan, or urinal?: A Lot Help from another person bathing (including washing, rinsing, drying)?: A Lot Help from another person to put on and taking off regular upper body clothing?: A Lot Help from another person to put on and taking off regular lower body clothing?: A Lot 6 Click Score: 14    End of Session    OT Visit Diagnosis: Unsteadiness on feet (R26.81);Other abnormalities of gait and mobility (R26.89);Muscle weakness (generalized) (M62.81)   Activity Tolerance Patient tolerated treatment well   Patient Left in bed;with call bell/phone within  reach;with bed alarm set;with nursing/sitter in room   Nurse Communication Mobility status (confirmed can participate in bed level activity)        Time: 1535-1600 OT Time Calculation (min): 25 min  Charges: OT General Charges $OT Visit: 1 Visit OT Treatments $Self Care/Home Management : 8-22 mins $Therapeutic Exercise: 8-22 mins  [name], OTD, OTR/L SecureChat Preferred Acute Rehab [phone]   [name] 07/11/2023, 4:26 PM

## 2023-07-11 NOTE — Progress Notes (Signed)
[address]       [name] [address]             [phone]      7 Days Post-Op  Procedure(s) (LRB): TEMPORARY PACEMAKER (N/A)   Total Length of Stay:  LOS: 19 days    SUBJECTIVE: Uncomforatable No other complaints Had to stop CRRT for alarm issues. Nephrology to assess today for new access Art line in about 1.5 weeks per nursing  Vitals:   07/11/23 0345 07/11/23 0400  BP:    Pulse: 85 85  Resp: 19 20  Temp:  98 F (36.7 C)  SpO2: 98% 98%    Intake/Output      09/02 0701 09/03 0700   I.V. (mL/kg) 1671.6 (21.2)   Other 30   NG/GT 1138.4   Total Intake(mL/kg) 2840 (36)   Stool 375   CRRT 1619.6   Total Output 1994.6   Net +845.4            prismasol BGK 4/2.5 400 mL/hr at 07/10/23 1220    prismasol BGK 4/2.5 400 mL/hr at 07/10/23 1219   sodium chloride 10 mL/hr at 07/11/23 0400   sodium chloride 10 mL/hr at 07/11/23 0530   albumin human Stopped (06/23/23 0040)   DOBUTamine 5 mcg/kg/min (07/11/23 0400)   epinephrine 10 mcg/min (07/11/23 0400)   feeding supplement (VITAL 1.5 CAL) 40 mL/hr at 07/11/23 0400   norepinephrine (LEVOPHED) Adult infusion 4 mcg/min (07/11/23 0400)   prismasol BGK 4/2.5 1,800 mL/hr at 07/10/23 2246   vasopressin 0.04 Units/min (07/11/23 0517)    CBC    Component Value Date/Time   WBC 5.5 07/11/2023 0334   RBC 2.44 (L) 07/11/2023 0334   HGB 7.8 (L) 07/11/2023 0334   HCT 26.3 (L) 07/11/2023 0334   PLT 151 07/11/2023 0334   MCV 107.8 (H) 07/11/2023 0334   MCH 32.0 07/11/2023 0334   MCHC 29.7 (L) 07/11/2023 0334   RDW 21.5 (H) 07/11/2023 0334   LYMPHSABS 2.2 03/15/2020 0947   MONOABS 0.5 03/15/2020 0947   EOSABS 0.2 03/15/2020 0947   BASOSABS 0.0 03/15/2020 0947   CMP     Component Value Date/Time   NA 134 (L) 07/11/2023 0334   K 4.1 07/11/2023 0334   CL 99 07/11/2023 0334   CO2 25 07/11/2023 0334   GLUCOSE 145 (H) 07/11/2023 0334   BUN 39 (H) 07/11/2023 0334   CREATININE 2.41 (H)  07/11/2023 0334   CALCIUM 10.7 (H) 07/11/2023 0334   CALCIUM 9.8 02/18/2011 1007   PROT 8.1 07/05/2023 0452   ALBUMIN 2.3 (L) 07/11/2023 0334   AST 46 (H) 07/05/2023 0452   ALT 13 07/05/2023 0452   ALKPHOS 78 07/05/2023 0452   BILITOT 1.0 07/05/2023 0452   GFRNONAA 32 (L) 07/11/2023 0334   GFRAA 10 (L) 03/16/2020 0825   ABG    Component Value Date/Time   PHART 7.321 (L) 07/10/2023 1629   PCO2ART 50.6 (H) 07/10/2023 1629   PO2ART 127 (H) 07/10/2023 1629   HCO3 26.2 07/10/2023 1629   TCO2 28 07/10/2023 1629   ACIDBASEDEF 1.0 07/10/2023 1629   O2SAT 81.1 07/11/2023 0334   CBG (last 3)  Recent Labs    07/10/23 2004 07/11/23 0057 07/11/23 0401  GLUCAP 134* 129* 119*  EXAM Lungs: overall clear Card: rr with soft murmur Ext: warm Neuro: intact   ASSESSMENT: Post AVR, MV/TV repair, MAZE Continues on pressor support. Echo with normal LV with dilated RV. Need to  assess for Fistula closure. If RHC needed to assess for high output, then that and look for pericardiocentesis for effusion. Discussed this with pt.  Will keep EPW in till time for pericardiocentesis Discuss timing of PPM Continue tube feeds. Assess for swallowing ability.   Eugenio Hoes, MD 07/11/2023

## 2023-07-11 NOTE — Progress Notes (Addendum)
Advanced Heart Failure Rounding Note  PCP-Cardiologist: Reatha Harps, MD   Subjective:   8/15: Elective triple vale surgery + MAZE. Underwent aortic valve replacement + mitral and tricuspid valve repairs by Dr. Milinda Antis 8/22: AHF consulted for high-output HF with persistent post-op shock 8/25: CRRT stopped 8/26: CRRT restarted  08/27: S/p TTVP Echo: EF 70-75%, severe LVH, RV mod reduced, RV severely enlarged. LA/RA mod dilated. Large pericardial effusion. No evidence of tamponade.   Currently on 10 Epi + 3 NE + 0.04 Vaso + 5 DBA. Midodrine 15 tid CO-OX 81% (poss sepsis + AV fistula).  Remains on CVVHD. Had issued with CRRT/access overnight, unable to pull much. Net +1.1L. CVP 18. HD cath being TPA'd this morning.   Remains on TFs.   Alert and oriented this morning. Writing things down. On BiPAP for this am's ABG. Would like family notified about plans for today.   Objective:    Weight Range: 78.8 kg Body mass index is 21.71 kg/m.   Vital Signs:   Temp:  [97.5 F (36.4 C)-98.2 F (36.8 C)] 98 F (36.7 C) (09/03 0755) Pulse Rate:  [80-87] 82 (09/03 0845) Resp:  [15-35] 24 (09/03 0845) SpO2:  [95 %-100 %] 99 % (09/03 0845) Arterial Line BP: (73-126)/(34-58) 108/49 (09/03 0845) FiO2 (%):  [30 %] 30 % (09/03 0816) Weight:  [78.8 kg] 78.8 kg (09/03 0500) Last BM Date : 07/10/23  Weight change: Filed Weights   07/09/23 0500 07/10/23 0500 07/11/23 0500  Weight: 78.6 kg 78.9 kg 78.8 kg   Intake/Output:   Intake/Output Summary (Last 24 hours) at 07/11/2023 0908 Last data filed at 07/11/2023 0800 Gross per 24 hour  Intake 2853.69 ml  Output 1619.4 ml  Net 1234.29 ml    Physical Exam  General:  weak appearing.  No respiratory difficulty HEENT: +cortrak Neck: supple. JVD difficult to see. Carotids 2+ bilat; no bruits. No lymphadenopathy or thyromegaly appreciated. [facility] HD, [facility] CVC Cor: PMI nondisplaced. Regular rate & rhythm. No rubs, gallops or murmurs. Lungs: exp  wheezes Abdomen: soft, nontender, nondistended. No hepatosplenomegaly. No bruits or masses. Good bowel sounds. Extremities: no cyanosis, clubbing, rash, edema. L brachial a line. RUE fistula +bruit/thrill.  Neuro: alert & oriented x 3, cranial nerves grossly intact. moves all 4 extremities w/o difficulty. Affect pleasant.  Telemetry   V paced 70s. Personally reviewed  Labs    CBC Recent Labs    07/10/23 0319 07/10/23 1629 07/11/23 0334 07/11/23 0803  WBC 6.5  --  5.5  --   HGB 8.4*   < > 7.8* 9.5*  HCT 28.1*   < > 26.3* 28.0*  MCV 108.1*  --  107.8*  --   PLT 156  --  151  --    < > = values in this interval not displayed.   Basic Metabolic Panel Recent Labs    41/32/44 0319 07/10/23 1604 07/10/23 1629 07/11/23 0334 07/11/23 0803  NA 133* 134*   < > 134* 137  K 3.7 3.9   < > 4.1 4.4  CL 101 101  --  99  --   CO2 24 26  --  25  --   GLUCOSE 132* 134*  --  145*  --   BUN 31* 32*  --  39*  --   CREATININE 1.97* 1.93*  --  2.41*  --   CALCIUM 10.4* 10.4*  --  10.7*  --   MG 2.5*  --   --  2.5*  --  PHOS 2.0* 2.8  --  3.5  --    < > = values in this interval not displayed.   Liver Function Tests Recent Labs    07/10/23 1604 07/11/23 0334  ALBUMIN 2.5* 2.3*   No results for input(s): "LIPASE", "AMYLASE" in the last 72 hours. Cardiac Enzymes No results for input(s): "CKTOTAL", "CKMB", "CKMBINDEX", "TROPONINI" in the last 72 hours.  BNP: BNP (last 3 results) No results for input(s): "BNP" in the last 8760 hours.  ProBNP (last 3 results) No results for input(s): "PROBNP" in the last 8760 hours.  D-Dimer No results for input(s): "DDIMER" in the last 72 hours. Hemoglobin A1C No results for input(s): "HGBA1C" in the last 72 hours. Fasting Lipid Panel No results for input(s): "CHOL", "HDL", "LDLCALC", "TRIG", "CHOLHDL", "LDLDIRECT" in the last 72 hours. Thyroid Function Tests No results for input(s): "TSH", "T4TOTAL", "T3FREE", "THYROIDAB" in the last 72  hours.  Invalid input(s): "FREET3"  Other results:  Imaging   No results found.  Medications:     Scheduled Medications:  acyclovir  200 mg Per Tube BID   aspirin  81 mg Per Tube Daily   atorvastatin  10 mg Per Tube QHS   Chlorhexidine Gluconate Cloth  6 each Topical Daily   colchicine  0.3 mg Per Tube Once every 5 days   darbepoetin (ARANESP) injection - DIALYSIS  150 mcg Subcutaneous Q Tue-1800   docusate  100 mg Per Tube BID   escitalopram  10 mg Per Tube Daily   feeding supplement (PROSource TF20)  60 mL Per Tube Q4H   folic acid  1 mg Intravenous Daily   guaiFENesin  15 mL Per Tube BID   heparin injection (subcutaneous)  5,000 Units Subcutaneous Q8H   insulin aspart  0-24 Units Subcutaneous Q4H   lidocaine  1 patch Transdermal Q24H   lidocaine-prilocaine  1 Application Topical Q M,W,F   metoCLOPramide (REGLAN) injection  5 mg Intravenous Q8H   midodrine  15 mg Per Tube TID   multivitamin  1 tablet Per Tube BID   mouth rinse  15 mL Mouth Rinse 4 times per day   pantoprazole (PROTONIX) IV  40 mg Intravenous Q12H   polyethylene glycol  17 g Per Tube Daily   QUEtiapine  12.5 mg Per Tube QHS   sodium chloride flush  3 mL Intravenous Q12H   thiamine (VITAMIN B1) injection  100 mg Intravenous Daily    Infusions:   prismasol BGK 4/2.5 400 mL/hr at 07/10/23 1220    prismasol BGK 4/2.5 400 mL/hr at 07/10/23 1219   sodium chloride Stopped (07/11/23 0529)   sodium chloride Stopped (07/11/23 0543)   albumin human Stopped (06/23/23 0040)   DOBUTamine 5 mcg/kg/min (07/11/23 0800)   epinephrine 10 mcg/min (07/11/23 0800)   feeding supplement (VITAL 1.5 CAL) 40 mL/hr at 07/11/23 0800   norepinephrine (LEVOPHED) Adult infusion 2 mcg/min (07/11/23 0700)   prismasol BGK 4/2.5 1,800 mL/hr at 07/10/23 2246   vasopressin 0.04 Units/min (07/11/23 0700)    PRN Medications: sodium chloride, sodium chloride, acetaminophen, albumin human, bisacodyl **OR** bisacodyl, clonazePAM,  heparin, ondansetron (ZOFRAN) IV, mouth rinse, oxyCODONE, phenol, simethicone, sodium chloride flush, traMADol  Patient Profile   52 y/o AAM w/ mildly reduced systolic heart failure (EF once 45-50% in 2013 but normal on subsequent echos) valvular heart disease including severe AS, 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 EF 55-60% w/ mild RV dysfunction by echo in 6/24.    Admitted for elective triple vale surgery + MAZE. S/p bioprosthetic aortic valve replacement + mitral and tricuspid valve repairs. Post-op course c/b persistent vasoplagic syndrome.   Assessment/Plan  1. Post-op Vasopalegia  - Echo 06/29/23 EF 60-65%, no RWMA, small pericardial effusion present, MV repaired/replaced, trivial MR. TR repaired/replaced, mild TR. AV repaired/replaced.  - Echo 07/04/23: EF 70-75%, severe concentric LVH, RV mod reduced, RV severely enlarged. LA/RA mod dilated. Large pericardial effusion. No evidence of tamponade - Echo 08/29: Moderate pericardial effusion, no tamponade - Suspected high output HF 2/2 AV fistula  - POD #18 remains dependent on high-dose pressors - PCT elevated but in setting of ESRD. Completed Zosyn for possible septic component - CVP 18/19 Targeting CVP 12-13 d/t severe RV failure  - CVVHD currently on hold while line is being TPA'd. Has not tolerated volume removal well. - Decrease DBA 5>2.5 today. Plan to turn off tomorrow. Will wean further later today if possible.  - Plan for RHC today to access for fistula closure. PCCM consulting vascular.  - We still are not making any meaningful progress. Need to try to wean pressors. If unable to wean then need to consider comfort care.    2.  Acute on Chronic Systolic Heart Failure w/ Prominent RV Dysfunction  - LVEF once 45-50% in 2013 but normal on subsequent echos. Pre-op TTE 6/25 EF 55-60%,  GIIIDD (Restrictive) RV mildly reduced, mod PH w/ estimated RVSP 48 mmHg  - Intra-op TEE (while on 4 mcg of Epi) LVEF 45%, RV mod-severely reduced, stable AV prosthesis w/ normal gradient and stable valvuloplasty rings w/ mod residual MS, mean gradient 7 mmgH.   - echo 07/04/23: EF 70-75%, severe concentric LVH, RV mod reduced, RV severely enlarged. LA/RA mod dilated. Large pericardial effusion. No evidence of tamponade.  - Echo 08/29: EF 65-70%, RV moderately reduced, pericardial effusion slightly smaller, no tamponade - Increase Midodrine 15>20 mg TID - Inotropes/pressors and volume management as above. Continue to wean as toelrated  3. 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 Post-op CHB  - EP following. - S/p RIJ temp wire 08/27 - EPW left in place d/t concern for risk of tamponade - V paced 80. Underneath sinus with variable PR 50-60s. BP optimized with VPacing at 85-90   5. 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 Nephrology following - Plan as above   6. H/o Atrial Fibrillation  - S/p MAZE    7. Thrombocytopenia/ Anemia - Thrombocytopenia resolved - Hgb 9.5 today  8. ID: ?sepsis component  - Completed Zosyn per CCM.  - PCT elevated in setting of ESRD  9. Large pericardial effusion - seen on echo 07/04/23. Previously small on echo 06/29/23 - no evidence of tamponade - Limited echo 08/29 stable  10. GOC - palliative care initially following. Now signed off 9/2 per patient request - remains full code - remains tenuous with high pressor requirements  Length of Stay: 19  Alen Bleacher, NP  07/11/2023, 9:08 AM  Advanced Heart Failure Team Pager [phone] (M-F; 7a - 5p)  Please contact [facility] for night-coverage after hours (5p  -7a ) and weekends  on [email or web address]  Agree.   Remains on high dose pressors. On CVVHD keeping even. No CP or SOB.   Not interested in Palliative Care  General:  Sitting up in bed . No resp difficulty HEENT: normal Neck: supple. + HD cath Carotids 2+ bilat; no bruits.appreciated. Cor: Regular rate & rhythm. No rubs, gallops or murmurs. Lungs: clear Abdomen: soft, nontender, nondistended. No hepatosplenomegaly. No bruits or masses. Good bowel sounds. Extremities: no cyanosis, clubbing, rash, tr edema Neuro: alert & orientedx3, cranial nerves grossly intact. moves all 4 extremities w/o difficulty. Affect pleasant  Difficult situation. Remains pressor-dependent. Suspect mostly due to RV failure but high coox implies possible vasodilatory state from AVF.   Will wean DBA off. Increase midodrine.   Will plan RHC tomorrow with temporary occlusion of AVF to assess for change in hemodynamics.  D/w Drs. Jones Skene and Wells.  Family updated  CRITICAL CARE Performed by: Arvilla Meres  Total critical care time: 50 minutes  Critical care time was exclusive of separately billable procedures and treating other patients.  Critical care was necessary to treat or prevent imminent or life-threatening deterioration.  Critical care was time spent personally by me (independent of midlevel providers or residents) on the following activities: development of treatment plan with patient and/or surrogate as well as nursing, discussions with consultants, evaluation of patient's response to treatment, examination of patient, obtaining history from patient or surrogate, ordering and performing treatments and interventions, ordering and review of laboratory studies, ordering and review of radiographic studies, pulse oximetry and re-evaluation of patient's condition.  Arvilla Meres, MD  1:39 PM

## 2023-07-11 NOTE — Progress Notes (Signed)
   07/11/23 1951  BiPAP/CPAP/SIPAP  BiPAP/CPAP/SIPAP Pt Type Adult  BiPAP/CPAP/SIPAP V60  Mask Type Full face mask  Mask Size Large  Set Rate 10 breaths/min  Respiratory Rate 20 breaths/min  IPAP 12 cmH20  EPAP 5 cmH2O  FiO2 (%) 30 %  Minute Ventilation 9.3  Leak 40  Peak Inspiratory Pressure (PIP) 13  Tidal Volume (Vt) 455  Patient Home Equipment No  Auto Titrate No  Press High Alarm 25 cmH2O  Press Low Alarm 5 cmH2O  CPAP/SIPAP surface wiped down Yes  BiPAP/CPAP /SiPAP Vitals  Pulse Rate 82  Resp (!) 22  SpO2 100 %  Bilateral Breath Sounds Clear  MEWS Score/Color  MEWS Score 1  MEWS Score Color [name]

## 2023-07-11 NOTE — Plan of Care (Signed)
  Problem: Education: Goal: Knowledge of General Education information will improve Description: Including pain rating scale, medication(s)/side effects and non-pharmacologic comfort measures Outcome: Progressing   Problem: Health Behavior/Discharge Planning: Goal: Ability to manage health-related needs will improve Outcome: Progressing   Problem: Clinical Measurements: Goal: Ability to maintain clinical measurements within normal limits will improve Outcome: Not Progressing Goal: Will remain free from infection Outcome: Progressing Goal: Diagnostic test results will improve Outcome: Progressing Goal: Respiratory complications will improve Outcome: Progressing Goal: Cardiovascular complication will be avoided Outcome: Progressing   Problem: Activity: Goal: Risk for activity intolerance will decrease Outcome: Not Progressing   Problem: Nutrition: Goal: Adequate nutrition will be maintained Outcome: Not Progressing   Problem: Coping: Goal: Level of anxiety will decrease Outcome: Progressing   Problem: Elimination: Goal: Will not experience complications related to bowel motility Outcome: Progressing   Problem: Pain Managment: Goal: General experience of comfort will improve Outcome: Progressing   Problem: Safety: Goal: Ability to remain free from injury will improve Outcome: Progressing   Problem: Skin Integrity: Goal: Risk for impaired skin integrity will decrease Outcome: Progressing   Problem: Education: Goal: Will demonstrate proper wound care and an understanding of methods to prevent future damage Outcome: Not Applicable Goal: Knowledge of disease or condition will improve Outcome: Progressing Goal: Knowledge of the prescribed therapeutic regimen will improve Outcome: Progressing   Problem: Activity: Goal: Risk for activity intolerance will decrease Outcome: Progressing   Problem: Cardiac: Goal: Will achieve and/or maintain hemodynamic  stability Outcome: Not Progressing   Problem: Clinical Measurements: Goal: Postoperative complications will be avoided or minimized Outcome: Not Progressing   Problem: Respiratory: Goal: Respiratory status will improve Outcome: Progressing   Problem: Skin Integrity: Goal: Wound healing without signs and symptoms of infection Outcome: Progressing Goal: Risk for impaired skin integrity will decrease Outcome: Progressing   Problem: Urinary Elimination: Goal: Ability to achieve and maintain adequate renal perfusion and functioning will improve Outcome: Not Progressing

## 2023-07-11 NOTE — Progress Notes (Addendum)
    Mr. [name] is a critically ill 52 year old male status post AVR, MV repair, and MAZE.  He is scheduled for right heart catheterization with fistula occlusion study tomorrow with Dr. Gala Romney.  If positive, vascular will be available for right arm AV fistula ligation.  Emilie Rutter, PA-C [facility] [phone] 07/11/2023  4:02 PM   I have interviewed the patient and examined the patient. I agree with the findings by the PA.  He has an excellent thrill in his right upper arm fistula.  If his heart function improves with occlusion of his fistula tomorrow then we can arrange for ligation of his fistula.  Cari Caraway, MD

## 2023-07-11 NOTE — Progress Notes (Signed)
Nutrition Follow-up  DOCUMENTATION CODES:   Severe malnutrition in context of chronic illness  INTERVENTION:   - Recommend initiation of supplemental TPN at this time given severe malnutrition and inadequate nutrition since 8/28. Discussed nutrition POC with Dr. Chestine Spore. Plan is to hold off on TPN at this time and continue to titrate tube feeds to goal.  Continue tube feeding via Cortrak (gastric): - Change to Pivot 1.5 @ 45 ml/hr. Titrate by 10 ml every 8 hours to goal rate of 65 ml/hr (1560 ml/day). - PROSource TF20 60 ml daily  Tube feeding regimen at goal rate provides 2420 kcal, 166 grams of protein, and 1170 ml of H2O.  - Continue renal MVI BID per tube  - Continue IV thiamine for possible thiamine deficiency  - Continue IV folic acid for folate deficiency  NUTRITION DIAGNOSIS:   Severe Malnutrition related to chronic illness as evidenced by severe muscle depletion, severe fat depletion.  Ongoing, being addressed via enteral nutrition  GOAL:   Patient will meet greater than or equal to 90% of their needs  Unmet at this time, will be met via enteral nutrition at goal rate  MONITOR:   PO intake, TF tolerance, Labs, Weight trends  REASON FOR ASSESSMENT:   Consult Assessment of nutrition requirement/status  ASSESSMENT:   52 y/o male with h/o glomerulonephritis (age 41) s/p renal transplant (1999) and right and left renal transplants (2015), ESRD on HD (>20 yrs), HTN, bipolar 2 disorder, PTSD, ADHD, anxiety, depression, GERD, OSA, NICM, thrombocytopenia, pulmonary hypertension, afib s/p DCCV (03/2019), CHF, severe mitral and tricuspid valve regurgitation and moderate aortic stenosis s/p mitral valve repair, tricuspid valve annuloplasty, aortic valve replacement and full right and left maze procedure 8/16.  8/15 - AVR, MVR, TVR by Dr. Leafy Ro 8/16 - CRRT initiated 8/17 - extubated, clear liquid diet 8/19 - chest tube inserted for effusion, BiPAP overnight 8/20 - no BM,  abd x-ray with nonobstructive bowel gas pattern 8/21 - Cortrak placed, TF initiated 8/22 - NPO due to mental status, TF continued 8/25 - CRRT discontinued  8/26 - CRRT restarted 8/28 - TF on hold 8/29 - TF remains on hold 8/30 - trickle TF resumed per MD 8/31 - TF rate increased to 30 ml/hr 9/02 - TF rate increased to 35 ml/hr 9/03 - CRRT off due to access issues, TF rate increased to 40 ml/hr  Pt remains NPO with Cortrak in place. Vital 1.5 tube feeds infusing at 40 ml/hr at time of RD visit. Pt refusing PROSource due to nausea. CRRT temporarily suspended due to temp cath clotting overnight.  Discussed pt with PCCM. RD who followed up on pt on 8/30 recommended initiation of TPN given ongoing TF intolerance and severity of malnutrition. CCM decided instead to proceed with trial of trickle TF (despite increased pressor support and diastolic pressures in the 20s to 30s) with plan to start TPN if decision was made to continue current level of care after Palliative Medicine discussion regarding GOC. Palliative Medicine saw pt on 8/30 with plan/recommendation to continue full code/full prescription treatment. However, TPN was never initiated.  Pt is currently on Vital 1.5 @ 40 ml/hr. Goal rate of Vital 1.5 is 60 ml/hr with PROSource QID to meet pt's high kcal and protein needs. However, pt is currently refusing all PROSource as he believes it is causing him to have nausea. During discussion with PCCM, RD recommended initiation of supplemental TPN given inadequate nutrition since 8/28 and severe malnutrition. Per PCCM, potential for several vascular surgeries  that would make starting TPN a higher infection rate. PCCM requests continued titration of tube feeds to goal with plan to reconsider TPN later in the week if pt still not meeting nutritional needs at that time.  RD will adjust pt's TF formula to Pivot 1.5 so that he requires only 1 PROSource TF. Pt's new goal rate is 65 ml/hr.  Pt receiving 100  mg IV thiamine daily for possible thiamine deficiency. Thiamine level still pending.  At time of RD visit, RN administering medications via Cortrak. Pt complaining of nausea and requesting anti-nausea medications by writing on a notepad. Pt on scheduled IV reglan.  Noted plan for RHC today. Weight down almost 20 kg this admission. However, pt with non-pitting edema to BLE. Unsure of true dry weight at this time.  Admit weight: 95 kg Current weight: 78.8 kg  BP (a-line): 100/46 MAP (a-line): 61  Drips: Levophed: 2 mcg/min Epinephrine: 10 mcg/min Vasopressin: 0.04 units/min Dobutamine: 5 mcg/kg/min  Medications reviewed and include: aranesp, colace, IV folic acid 1 mg daily, SSI every 4 hours, IV reglan 5 mg every 8 hours, rena-vit, IV protonix, miralax, IV thiamine 100 mg daily  Micronutrient Labs: CRP: 7.9 (H) Ceruloplasmin 36.5 (H) Copper: 131 (WNL) Folate, serum: 3.6 (L) 06/26/23 Iron, serum: 121 (WNL) Thiamine: pending Vitamin B12: 1175 (H) 06/26/23 Vitamin B6: 21.6 (WNL) Vitamin C: 0.7 (WNL) Zinc: 95 (WNL)  Labs reviewed: BUN 39, creatinine 2.41, ionized calcium 1.59, magnesium 2.5, hemoglobin 9.2 CBG's: 115-139 x 24 hours  CRRT UF: 1619 ml x 24 hours FMS: 375 ml x 24 hours I/O's: -5.1 L since admit  Diet Order:   Diet Order             Diet NPO time specified  Diet effective now                   EDUCATION NEEDS:   Not appropriate for education at this time  Skin:  Skin Assessment: Skin Integrity Issues: Incisions: sternal  Last BM:  07/11/23 FMS with 375 ml output x 24 hours  Height:   Ht Readings from Last 1 Encounters:  07/04/23 6\' 3"  (1.905 m)    Weight:   Wt Readings from Last 1 Encounters:  07/11/23 78.8 kg    Ideal Body Weight:  89 kg  BMI:  Body mass index is 21.71 kg/m.  Estimated Nutritional Needs:   Kcal:  2400-2600 kcals  Protein:  155-190 grams  Fluid:  1L plus UOP    Mertie Clause, MS, RD, LDN Registered Dietitian  II Please see AMiON for contact information.

## 2023-07-11 NOTE — Progress Notes (Signed)
[facility] Progress Note  Subjective: I/O net was +103 yest.  Temp cath stopped working overnight and has TPA in it now. CRRT is off.    Vitals:   07/11/23 0900 07/11/23 0915 07/11/23 0930 07/11/23 0945  BP:      Pulse: 81 83 81 82  Resp: 13 (!) 25 20 20   Temp:      TempSrc:      SpO2: 100% 100% 100% 100%  Weight:      Height:        Physical Exam:       General adult male in bed on bipap Lungs clear but reduced to auscultation bilaterally  Heart S1S2 Abdomen soft nontender nondistended Extremities no edema appreciated Neuro - resting and awakens more easily; Oriented to person and year. Provides some history and answers some questions   Access RUE AVF with bruit and thrill; left internal jugular nontunneled dialysis catheter    OP HD: [facility] MWF nocturnal 3h    F250     94kg   2K/2.5 Ca bath   RUE AVF    Summary: [name] is a/an 52 y.o. male with a past medical history notable for ESRD on HD admitted with severe MR and TR and aortic stenosis s/p repair/replacement.    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  Per cardiothoracic surgery  # Complete heart block - post-op, per EP/ cardiology   # ESRD - pt was too hypotensive for hemodialysis post-op.  CRRT started 8/16 and still remains on CRRT (brief break 8/25- 8/26). Current UF goal is keep even as tolerated.   # HD access - temp cath clotted overnight. TPA is dwelling now and CRRT temporarily suspended.    # Distributive Shock postoperatively - remains on 3 pressors + dobutamine gtt and po midodrine.  Fluid volume overload is improved. Recent wts are 77- 79kg (dry wt was 94kg, admit wt 95kg).   # Pericardial effusion - per CT surgery and CHF.  We are optimizing volume status with CRRT as above.  Assessed as moderate on last TTE   # Anemia of ESRD - transfuse as needed  per primary team. Concurrent thrombocytopenia improved.  Aranesp increased to 150 mcg weekly on Tuesdays    # MBD ckd - hypercalcemia.  Note he was transitioned off of nepro after starting back on CRRT.  Phos acceptable.  No binders currently as on CRRT - normally on renvela.  Check intact PTH.  May need sensipar.    # Vascular access - RUE AVF had been functioning well but have been unable to use due to shock and CRRT requirement.  Temporary dialysis catheter in place for CRRT.  [name]  MD  [facility] 07/11/2023, 11:06 AM  Recent Labs  Lab 07/10/23 1604 07/10/23 1629 07/11/23 0334 07/11/23 0803 07/11/23 0936  HGB  --    < > 7.8* 9.5* 9.2*  ALBUMIN 2.5*  --  2.3*  --   --   CALCIUM 10.4*  --  10.7*  --   --   PHOS 2.8  --  3.5  --   --   CREATININE 1.93*  --  2.41*  --   --   K 3.9   < > 4.1 4.4 4.4   < > = values in this interval not displayed.  Inpatient medications:  acyclovir  200 mg Per Tube BID   aspirin  81 mg Per Tube Daily   atorvastatin  10 mg Per Tube QHS   Chlorhexidine Gluconate Cloth  6 each Topical Daily   colchicine  0.3 mg Per Tube Once every 5 days   darbepoetin (ARANESP) injection - DIALYSIS  150 mcg Subcutaneous Q Tue-1800   docusate  100 mg Per Tube BID   escitalopram  10 mg Per Tube Daily   feeding supplement (PROSource TF20)  60 mL Per Tube Daily   folic acid  1 mg Intravenous Daily   guaiFENesin  15 mL Per Tube BID   heparin injection (subcutaneous)  5,000 Units Subcutaneous Q8H   insulin aspart  0-24 Units Subcutaneous Q4H   lidocaine  1 patch Transdermal Q24H   lidocaine-prilocaine  1 Application Topical Q M,W,F   metoCLOPramide (REGLAN) injection  5 mg Intravenous Q8H   midodrine  15 mg Per Tube TID   multivitamin  1 tablet Per Tube BID   mouth rinse  15 mL Mouth Rinse 4 times per day   pantoprazole (PROTONIX) IV  40 mg Intravenous Q12H   polyethylene glycol  17 g Per Tube Daily   QUEtiapine  12.5 mg Per Tube QHS   sodium chloride flush  3 mL  Intravenous Q12H   thiamine (VITAMIN B1) injection  100 mg Intravenous Daily     prismasol BGK 4/2.5 400 mL/hr at 07/10/23 1220    prismasol BGK 4/2.5 400 mL/hr at 07/10/23 1219   sodium chloride Stopped (07/11/23 0529)   sodium chloride Stopped (07/11/23 0543)   albumin human Stopped (06/23/23 0040)   DOBUTamine 5 mcg/kg/min (07/11/23 0900)   epinephrine 10 mcg/min (07/11/23 0900)   feeding supplement (PIVOT 1.5 CAL)     norepinephrine (LEVOPHED) Adult infusion 2 mcg/min (07/11/23 0700)   prismasol BGK 4/2.5 1,800 mL/hr at 07/10/23 2246   vasopressin 0.04 Units/min (07/11/23 0700)   sodium chloride, sodium chloride, acetaminophen, albumin human, bisacodyl **OR** bisacodyl, clonazepam, heparin, ondansetron (ZOFRAN) IV, mouth rinse, oxyCODONE, phenol, simethicone, sodium chloride flush, traMADol

## 2023-07-11 NOTE — Progress Notes (Signed)
NAME:  Raymand Yenter, MRN:  [id number], DOB:  1971-06-16, LOS: 19 ADMISSION DATE:  06/22/2023, CONSULTATION DATE: 06/22/2023 REFERRING MD: Leafy Ro - TCTS, CHIEF COMPLAINT: Postcardiac surgery  History of Present Illness:  This is a 52 year old gentleman, past medical history of anxiety depression, bipolar disease, congestive heart failure chronic diastolic heart failure kidney disease followed by renal transplant in 07/21/1998 repeat renal transplant in 2014-07-21.  Patient underwent surgery today for severe mitral regurgitation severe tricuspid regurgitation and moderate aortic stenosis.  He also has atrial fibrillation received maze procedure.  He had a complex mitral valve repair a tricuspid valve annuloplasty and aortic valve replacement followed by full right and left maze procedure.  Pertinent Medical History:   Past Medical History:  Diagnosis Date   ADHD (attention deficit hyperactivity disorder)    Anemia    Anemia, chronic disease 07/13/2011   Anxiety    Arthritis    Bipolar 2 disorder (HCC) 08/18/2011   Diagnosed in 07/21/2010.    Bipolar disorder (HCC)    Blood dyscrasia    Nephrotic Anemia   Blood transfusion    Blood transfusion without reported diagnosis    Chronic diastolic CHF (congestive heart failure) (HCC)    Complication of anesthesia    Woke up intubated gets combative  afraid to be alone    Congestive heart failure (CHF) (HCC) 07/13/2011   Onset 2005/07/21.  Followed by Dr. Eden Emms.  S/p cardiac catheterization in [facility] Dr. Anne Fu..  S/p cardiac catheterization in Jul 21, 2013 by Nishan.  Echo 21-Jul-2013.    Depression    Dysrhythmia    A. Fib 07/22/2023 with DCCV in May 2024   Erectile dysfunction    ESRD on hemodialysis (HCC) 07/13/2011   Glomerulonephritis at age 36, started HD in 1995-07-22.  Deceased donor renal transplant 1998/07/21 at [facility], then kidney failed and went back on HD in Jul 21, 2005.  L forearm AVF . s/p repeat renal transplant 10/2014 [facility].   GI bleed 11/01/2011   Rectal bleeding with  emesis/diarrhea    Heart murmur    Hemodialysis status (HCC)    Hydrocele, right 11/25/2014   Hypertension    Hypertensive emergency 01/06/2013   Hypoglycemia 01/11/2013   Influenza-like illness 12/18/2013   Mild ascending aorta dilatation (HCC)    NICM (nonischemic cardiomyopathy) (HCC)    Pericardial effusion    a. Mod by echo in 2013-07-21, similar to prior.   Peripheral polyneuropathy 08/04/2016   Pneumonia    Pulmonary edema 01/06/2013   Pulmonary hypertension associated with end stage renal disease on dialysis [facility]) 07/13/2011   Renal transplant, status post 11/25/2014   Pt with Glomerulonephritis at age 82. Deceased donor renal transplant 1998/07/21 right and left renal transplant 10/2014.  Followed by [facility] transplant team; nephrologist is Dr. Lonna Cobb.    Respiratory failure with hypoxia (HCC) 01/06/2013   Shortness of breath 12/12/2012   Swollen testicle 07/13/2011   URI (upper respiratory infection) 01/25/2012    Significant Hospital Events: Including procedures, antibiotic start and stop dates in addition to other pertinent events   8/15 - AVR, MVR, TVR with TCTS ([place]). Remains on multiple pressors (NE, Epi, vaso). 8/16 - [facility] Trialysis catheter placed for CRRT.  8/17 extubated  8/18 - remains on CVVHD, 3 pressors  8/19-8/20 improving pressor needs 8/21 agitated delirium; cortrak 8/26 CRRT restarted 8/27 TVP placed, brady overnight and pacing turned to 80 8/30 palli consult  9/1 PRBC for low coox  9/2 coox down a bit again  CRRT off overnight due to  access issues. Remains on vaso 0.04, epi 10, norepi 2, DBA 5.  Interim History / Subjective:     CRRT off overnight due to negative pressures on access. Refusing prostat due to nausea.   Objective:  Blood pressure (!) 102/42, pulse 87, temperature 98 F (36.7 C), temperature source Oral, resp. rate (!) 23, height 6\' 3"  (1.905 m), weight 78.8 kg, SpO2 100%. CVP:  [7 mmHg-19 mmHg] 15 mmHg  FiO2 (%):  [30 %] 30  % PEEP:  [5 cmH20] 5 cmH20   Intake/Output Summary (Last 24 hours) at 07/11/2023 0728 Last data filed at 07/11/2023 0700 Gross per 24 hour  Intake 3142.47 ml  Output 1994.6 ml  Net 1147.87 ml   Filed Weights   07/09/23 0500 07/10/23 0500 07/11/23 0500  Weight: 78.6 kg 78.9 kg 78.8 kg   Physical Examination:    General: ill appearing man lying in bed in NAD HEENT: [place]/AT, eyes anicteric, cortrak in place.   CV: S1S2, RRR/ Paced rhythm.   Pulm: breathing comfortably on Comfort, CTAB GI: soft, NT. + bowel sounds. Transplanted kidney in RLQ.  Derm: warm, dry, no diffuse rashes MSK: minimal muscle mass, no c/c Neuro: arouses with verbal stimulation, answers some questions but falls back asleep quickly. Globally weak.   Echo 9/1: moderate pericardial effusion, no tamponade. LVEF 60-65%  Resolved issues:   Possible PNA. Right pleural effusion - s/p pigtail chest tube with subsequent removal 8/25.   Assessment & Plan:   ICU delirium, acute encephalopathy   -delirium precautions -mobility as able with CRRT needs -con't lexapro, decrease dose of seroquel due to sedation/ lethargy -con't PTA clonazepam PRN  Acute respiratory failure with hypoxia -supplemental O2 and bipap PRN -volume management with CRRT; unfortunately getting a large volume of fluids with continuous pressor and inotrope infusions.   MVR, TVR, AS s/p bioprosthetic replacement CHB/ post op junctional rhythm s/p TVP (8/27) Cardiogenic shock, RV failure Enlarging pericardial effusion w/o tamponade AoC HFrEF> currently EF recovered Afib s/p MAZE  -post op distributive/vasoplegia, improved. Possible sepsis now off abx. Has RV failure. Pericardial effusion without tamponade phys  P -Appreciate TCTS management -EP following, eventually would need PPM. TVP for now and is pacer dependent. -goal SBP >95-- requiring midodrine + vaso, epi, NE, DBA to accomplish this. Still not able to tolerate iHD.  -may need to evaluate for  fistula occlusion RHC to eval RV due to concern for high output HF -Unfortunately, if there is not RV recovery we may be approaching EOL despite multidisciplinary best efforts. So far patient has been unwilling to consider deescalation of care. He has asked Palliative care to no longer be involved in his care team.   ESRD; CKD s/p failed renal transplants x 2 (1999, 2015)  Hypophosphatemia Hyponatremia  Hypokalemia  Hypercalcemia  Hypermagnesemia  History of kidney disease status post renal transplant (failed 1999 and 2015). P -CRRT on hold due to access issues-- cathflo and can retry access.  -monitor electrolytes and replete as needed  H/o HSV -PTA on acyclovir; continued.   Anemia - expected ABLA (resolved), critical illness, chronic dz  -transfuse for Hb <7 or hemodynamically significant bleeding -aranesp per nephrology  Moderate malnutrition  -TF; he is refusing prostat -reglan -SLP to re-test swallow  Possible gout flare - con't colchicine   Physical deconditioning  - PT, OT  GOC -Ongoing discussions needed.    Best Practice (right click and "Reselect all SmartList Selections" daily)   Diet/type:TF  DVT prophylaxis:heparin ppx GI prophylaxis: PPI  Lines: Central line, Dialysis Catheter, and Arterial Line Foley:  No  Code Status:  full code Last date of multidisciplinary goals of care discussion [Per Primary Team]  This patient is critically ill with multiple organ system failure which requires frequent high complexity decision making, assessment, support, evaluation, and titration of therapies. This was completed through the application of advanced monitoring technologies and extensive interpretation of multiple databases. During this encounter critical care time was devoted to patient care services described in this note for 35 minutes.  Steffanie Dunn, DO 07/11/23 7:44 AM [facility]  For contact information, see Amion. If no response to  pager, please call PCCM consult pager. After hours, 7PM- 7AM, please call Elink.

## 2023-07-11 NOTE — Progress Notes (Signed)
Telemetry/chart check note Remains on: Dobutamine Epi Nor-epi Vaso midodrine  CVVHD cath clotted/not functioning On Tube feeds   Pt asked palliative care no longer follow Remains full code  CHB persists Temp pacing wire  Threshold today is <81mA Left at 8 and 82bpm  Still, not a candidate for EP procedures, PPM implant  Francis Dowse, PA-C

## 2023-07-12 ENCOUNTER — Encounter (HOSPITAL_COMMUNITY): Payer: Self-pay | Admitting: Thoracic Surgery (Cardiothoracic Vascular Surgery)

## 2023-07-12 ENCOUNTER — Inpatient Hospital Stay (HOSPITAL_COMMUNITY)
Admission: RE | Disposition: E | Payer: Self-pay | Source: Home / Self Care | Attending: Thoracic Surgery (Cardiothoracic Vascular Surgery)

## 2023-07-12 DIAGNOSIS — G9341 Metabolic encephalopathy: Secondary | ICD-10-CM | POA: Diagnosis not present

## 2023-07-12 DIAGNOSIS — I50810 Right heart failure, unspecified: Secondary | ICD-10-CM | POA: Diagnosis not present

## 2023-07-12 DIAGNOSIS — Z9889 Other specified postprocedural states: Secondary | ICD-10-CM | POA: Diagnosis not present

## 2023-07-12 DIAGNOSIS — N186 End stage renal disease: Secondary | ICD-10-CM

## 2023-07-12 DIAGNOSIS — I5023 Acute on chronic systolic (congestive) heart failure: Secondary | ICD-10-CM | POA: Diagnosis not present

## 2023-07-12 DIAGNOSIS — J9601 Acute respiratory failure with hypoxia: Secondary | ICD-10-CM

## 2023-07-12 DIAGNOSIS — J9602 Acute respiratory failure with hypercapnia: Secondary | ICD-10-CM

## 2023-07-12 DIAGNOSIS — I442 Atrioventricular block, complete: Secondary | ICD-10-CM | POA: Diagnosis not present

## 2023-07-12 HISTORY — PX: RIGHT HEART CATH: CATH118263

## 2023-07-12 LAB — POCT I-STAT EG7
Acid-Base Excess: 0 mmol/L (ref 0.0–2.0)
Acid-Base Excess: 0 mmol/L (ref 0.0–2.0)
Acid-Base Excess: 0 mmol/L (ref 0.0–2.0)
Acid-Base Excess: 0 mmol/L (ref 0.0–2.0)
Acid-base deficit: 1 mmol/L (ref 0.0–2.0)
Bicarbonate: 25.7 mmol/L (ref 20.0–28.0)
Bicarbonate: 25.8 mmol/L (ref 20.0–28.0)
Bicarbonate: 24.6 mmol/L (ref 20.0–28.0)
Bicarbonate: 25.5 mmol/L (ref 20.0–28.0)
Bicarbonate: 25.9 mmol/L (ref 20.0–28.0)
Calcium, Ion: 1.34 mmol/L (ref 1.15–1.40)
Calcium, Ion: 1.5 mmol/L — ABNORMAL HIGH (ref 1.15–1.40)
Calcium, Ion: 1.36 mmol/L (ref 1.15–1.40)
Calcium, Ion: 1.41 mmol/L — ABNORMAL HIGH (ref 1.15–1.40)
Calcium, Ion: 1.49 mmol/L — ABNORMAL HIGH (ref 1.15–1.40)
HCT: 26 % — ABNORMAL LOW (ref 39.0–52.0)
HCT: 28 % — ABNORMAL LOW (ref 39.0–52.0)
HCT: 26 % — ABNORMAL LOW (ref 39.0–52.0)
HCT: 27 % — ABNORMAL LOW (ref 39.0–52.0)
HCT: 28 % — ABNORMAL LOW (ref 39.0–52.0)
Hemoglobin: 8.8 g/dL — ABNORMAL LOW (ref 13.0–17.0)
Hemoglobin: 9.5 g/dL — ABNORMAL LOW (ref 13.0–17.0)
Hemoglobin: 8.8 g/dL — ABNORMAL LOW (ref 13.0–17.0)
Hemoglobin: 9.2 g/dL — ABNORMAL LOW (ref 13.0–17.0)
Hemoglobin: 9.5 g/dL — ABNORMAL LOW (ref 13.0–17.0)
O2 Saturation: 58 %
O2 Saturation: 60 %
O2 Saturation: 52 %
O2 Saturation: 54 %
O2 Saturation: 54 %
Potassium: 4.5 mmol/L (ref 3.5–5.1)
Potassium: 4.8 mmol/L (ref 3.5–5.1)
Potassium: 4.4 mmol/L (ref 3.5–5.1)
Potassium: 4.5 mmol/L (ref 3.5–5.1)
Potassium: 4.8 mmol/L (ref 3.5–5.1)
Sodium: 137 mmol/L (ref 135–145)
Sodium: 138 mmol/L (ref 135–145)
Sodium: 137 mmol/L (ref 135–145)
Sodium: 138 mmol/L (ref 135–145)
Sodium: 140 mmol/L (ref 135–145)
TCO2: 27 mmol/L (ref 22–32)
TCO2: 27 mmol/L (ref 22–32)
TCO2: 26 mmol/L (ref 22–32)
TCO2: 27 mmol/L (ref 22–32)
TCO2: 27 mmol/L (ref 22–32)
pCO2, Ven: 46.3 mmHg (ref 44–60)
pCO2, Ven: 48.5 mmHg (ref 44–60)
pCO2, Ven: 46.9 mmHg (ref 44–60)
pCO2, Ven: 47.5 mmHg (ref 44–60)
pCO2, Ven: 48.8 mmHg (ref 44–60)
pH, Ven: 7.332 (ref 7.25–7.43)
pH, Ven: 7.353 (ref 7.25–7.43)
pH, Ven: 7.328 (ref 7.25–7.43)
pH, Ven: 7.333 (ref 7.25–7.43)
pH, Ven: 7.338 (ref 7.25–7.43)
pO2, Ven: 33 mmHg (ref 32–45)
pO2, Ven: 33 mmHg (ref 32–45)
pO2, Ven: 30 mmHg — CL (ref 32–45)
pO2, Ven: 30 mmHg — CL (ref 32–45)
pO2, Ven: 31 mmHg — CL (ref 32–45)

## 2023-07-12 LAB — GLUCOSE, CAPILLARY
Glucose-Capillary: 105 mg/dL — ABNORMAL HIGH (ref 70–99)
Glucose-Capillary: 107 mg/dL — ABNORMAL HIGH (ref 70–99)
Glucose-Capillary: 107 mg/dL — ABNORMAL HIGH (ref 70–99)
Glucose-Capillary: 115 mg/dL — ABNORMAL HIGH (ref 70–99)
Glucose-Capillary: 118 mg/dL — ABNORMAL HIGH (ref 70–99)
Glucose-Capillary: 135 mg/dL — ABNORMAL HIGH (ref 70–99)

## 2023-07-12 LAB — RENAL FUNCTION PANEL
Albumin: 2.4 g/dL — ABNORMAL LOW (ref 3.5–5.0)
Albumin: 2.5 g/dL — ABNORMAL LOW (ref 3.5–5.0)
Anion gap: 6 (ref 5–15)
Anion gap: 10 (ref 5–15)
BUN: 28 mg/dL — ABNORMAL HIGH (ref 6–20)
BUN: 26 mg/dL — ABNORMAL HIGH (ref 6–20)
CO2: 26 mmol/L (ref 22–32)
CO2: 22 mmol/L (ref 22–32)
Calcium: 10.5 mg/dL — ABNORMAL HIGH (ref 8.9–10.3)
Calcium: 11.1 mg/dL — ABNORMAL HIGH (ref 8.9–10.3)
Chloride: 100 mmol/L (ref 98–111)
Chloride: 102 mmol/L (ref 98–111)
Creatinine, Ser: 2.22 mg/dL — ABNORMAL HIGH (ref 0.61–1.24)
Creatinine, Ser: 2.32 mg/dL — ABNORMAL HIGH (ref 0.61–1.24)
GFR, Estimated: 35 mL/min — ABNORMAL LOW (ref >60)
GFR, Estimated: 33 mL/min — ABNORMAL LOW (ref >60)
Glucose, Bld: 109 mg/dL — ABNORMAL HIGH (ref 70–99)
Glucose, Bld: 101 mg/dL — ABNORMAL HIGH (ref 70–99)
Phosphorus: 3.1 mg/dL (ref 2.5–4.6)
Phosphorus: 3.5 mg/dL (ref 2.5–4.6)
Potassium: 4.3 mmol/L (ref 3.5–5.1)
Potassium: 4.6 mmol/L (ref 3.5–5.1)
Sodium: 132 mmol/L — ABNORMAL LOW (ref 135–145)
Sodium: 134 mmol/L — ABNORMAL LOW (ref 135–145)

## 2023-07-12 LAB — COOXEMETRY PANEL
Carboxyhemoglobin: 2.9 % — ABNORMAL HIGH (ref 0.5–1.5)
Methemoglobin: <0.7 % (ref 0.0–1.5)
O2 Saturation: 85.9 %
Total hemoglobin: 8.6 g/dL — ABNORMAL LOW (ref 12.0–16.0)

## 2023-07-12 LAB — MAGNESIUM: Magnesium: 2.6 mg/dL — ABNORMAL HIGH (ref 1.7–2.4)

## 2023-07-12 LAB — PARATHYROID HORMONE, INTACT (NO CA): PTH: 314 pg/mL — ABNORMAL HIGH (ref 15–65)

## 2023-07-12 SURGERY — RIGHT HEART CATH
Anesthesia: LOCAL

## 2023-07-12 MED ORDER — ORAL CARE MOUTH RINSE: 15.0000 mL | OROMUCOSAL | Status: DC | PRN

## 2023-07-12 MED ORDER — MIDAZOLAM HCL 2 MG/2ML IJ SOLN
INTRAMUSCULAR | Status: DC | PRN
  Administered 2023-07-12: 1 mg via INTRAVENOUS

## 2023-07-12 MED ORDER — ORAL CARE MOUTH RINSE
15.0000 mL | OROMUCOSAL | Status: DC
  Administered 2023-07-12 – 2023-07-22 (×30): 15 mL via OROMUCOSAL

## 2023-07-12 MED ORDER — FOLIC ACID 1 MG PO TABS
1.0000 mg | ORAL_TABLET | Freq: Every day | ORAL | Status: DC
  Administered 2023-07-12 – 2023-07-30 (×18): 1 mg
  Filled 2023-07-12 (×18): qty 1

## 2023-07-12 MED ORDER — LIDOCAINE HCL (PF) 1 % IJ SOLN
INTRAMUSCULAR | Status: AC
  Filled 2023-07-12: qty 30

## 2023-07-12 MED ORDER — FENTANYL CITRATE (PF) 100 MCG/2ML IJ SOLN
INTRAMUSCULAR | Status: AC
  Filled 2023-07-12: qty 2

## 2023-07-12 MED ORDER — HEPARIN SODIUM (PORCINE) 1000 UNIT/ML IJ SOLN
INTRAMUSCULAR | Status: AC
  Filled 2023-07-12: qty 10

## 2023-07-12 MED ORDER — HEPARIN (PORCINE) IN NACL 1000-0.9 UT/500ML-% IV SOLN
INTRAVENOUS | Status: DC | PRN
  Administered 2023-07-12 (×2): 500 mL

## 2023-07-12 MED ORDER — MIDAZOLAM HCL 2 MG/2ML IJ SOLN
INTRAMUSCULAR | Status: AC
  Filled 2023-07-12: qty 2

## 2023-07-12 MED ORDER — LIDOCAINE HCL (PF) 1 % IJ SOLN
INTRAMUSCULAR | Status: DC | PRN
  Administered 2023-07-12: 5 mL

## 2023-07-12 MED ORDER — HEPARIN SODIUM (PORCINE) 1000 UNIT/ML IJ SOLN
INTRAMUSCULAR | Status: DC | PRN
  Administered 2023-07-12: 5000 [IU] via INTRAVENOUS

## 2023-07-12 MED ORDER — FENTANYL CITRATE (PF) 100 MCG/2ML IJ SOLN
INTRAMUSCULAR | Status: DC | PRN
  Administered 2023-07-12: 25 ug via INTRAVENOUS

## 2023-07-12 MED ORDER — CINACALCET HCL 30 MG PO TABS
30.0000 mg | ORAL_TABLET | Freq: Every day | ORAL | Status: DC
  Administered 2023-07-14 – 2023-07-19 (×3): 30 mg via ORAL
  Filled 2023-07-12 (×12): qty 1

## 2023-07-12 SURGICAL SUPPLY — 6 items
CATH SWAN GANZ 7F STRAIGHT (CATHETERS) IMPLANT
KIT MICROPUNCTURE NIT STIFF (SHEATH) IMPLANT
PACK CARDIAC CATHETERIZATION (CUSTOM PROCEDURE TRAY) ×2 IMPLANT
SHEATH PINNACLE 7F 10CM (SHEATH) IMPLANT
SHEATH PROBE COVER 6X72 (BAG) IMPLANT
WIRE EMERALD 3MM-J .025X260CM (WIRE) IMPLANT

## 2023-07-12 NOTE — Progress Notes (Signed)
Telemetry/chart check note Remains on: Dobutamine Epi Nor-epi Vaso Midodrine > increased dose  CVVHD  On Tube feeds   Pt asked palliative care no longer follow Remains full code  CHB persists 49 this AM Temp pacing wire  Threshold today is still <33mA Left at 8 and 82bpm  Still, not a candidate for EP procedures, PPM implant  Francis Dowse, PA-C

## 2023-07-12 NOTE — Progress Notes (Signed)
      [address]       [place] [address]             [phone]      No complaints  BP 105/71   Pulse 80   Temp 97.8 F (36.6 C) (Oral)   Resp (!) 22   Ht 6\' 3"  (1.905 m)   Wt 82.8 kg   SpO2 99%   BMI 22.82 kg/m    Intake/Output Summary (Last 24 hours) at 07/12/2023 1822 Last data filed at 07/12/2023 1300 Gross per 24 hour  Intake 1986.41 ml  Output 2676 ml  Net -689.59 ml   RHC showed severe pulmonary hypertension  Started on NO  Sinthia Karabin C. Dorris Fetch, MD [facility] [phone]

## 2023-07-12 NOTE — Progress Notes (Signed)
[address]       [place] [address]             [phone]      8 Days Post-Op  Procedure(s) (LRB): TEMPORARY PACEMAKER (N/A)   Total Length of Stay:  LOS: 20 days    SUBJECTIVE: No complaints On bipap overnight No move on inotropes  Vitals:   07/12/23 0600 07/12/23 0700  BP:    Pulse: 82 82  Resp: 17 19  Temp:    SpO2: 100% 100%    Intake/Output      09/03 0701 09/04 0700 09/04 0701 09/05 0700   I.V. (mL/kg) 1686.6 (20.4)    Other     NG/GT 1233.5    Total Intake(mL/kg) 2920.1 (35.3)    Stool 330    CRRT 1855.3    Total Output 2185.3    Net +734.8              prismasol BGK 4/2.5 400 mL/hr at 07/12/23 0302    prismasol BGK 4/2.5 400 mL/hr at 07/12/23 0302   sodium chloride 10 mL/hr at 07/12/23 0700   sodium chloride 10 mL/hr at 07/12/23 0600   sodium chloride 10 mL/hr at 07/12/23 0700   albumin human Stopped (06/23/23 0040)   DOBUTamine 2.5 mcg/kg/min (07/12/23 0700)   epinephrine 10 mcg/min (07/12/23 0700)   feeding supplement (PIVOT 1.5 CAL) Stopped (07/11/23 2351)   norepinephrine (LEVOPHED) Adult infusion 6 mcg/min (07/12/23 0700)   prismasol BGK 4/2.5 1,800 mL/hr at 07/12/23 0412   vasopressin 0.04 Units/min (07/12/23 0700)    CBC    Component Value Date/Time   WBC 5.5 07/11/2023 0334   RBC 2.44 (L) 07/11/2023 0334   HGB 8.8 (L) 07/11/2023 1346   HCT 26.0 (L) 07/11/2023 1346   PLT 151 07/11/2023 0334   MCV 107.8 (H) 07/11/2023 0334   MCH 32.0 07/11/2023 0334   MCHC 29.7 (L) 07/11/2023 0334   RDW 21.5 (H) 07/11/2023 0334   LYMPHSABS 2.2 03/15/2020 0947   MONOABS 0.5 03/15/2020 0947   EOSABS 0.2 03/15/2020 0947   BASOSABS 0.0 03/15/2020 0947   CMP     Component Value Date/Time   NA 132 (L) 07/12/2023 0422   K 4.3 07/12/2023 0422   CL 100 07/12/2023 0422   CO2 26 07/12/2023 0422   GLUCOSE 109 (H) 07/12/2023 0422   BUN 28 (H) 07/12/2023 0422   CREATININE 2.22 (H) 07/12/2023 0422   CALCIUM 10.5 (H)  07/12/2023 0422   CALCIUM 9.8 02/18/2011 1007   PROT 8.1 07/05/2023 0452   ALBUMIN 2.4 (L) 07/12/2023 0422   AST 46 (H) 07/05/2023 0452   ALT 13 07/05/2023 0452   ALKPHOS 78 07/05/2023 0452   BILITOT 1.0 07/05/2023 0452   GFRNONAA 35 (L) 07/12/2023 0422   GFRAA 10 (L) 03/16/2020 0825   ABG    Component Value Date/Time   PHART 7.317 (L) 07/11/2023 1346   PCO2ART 48.2 (H) 07/11/2023 1346   PO2ART 103 07/11/2023 1346   HCO3 24.7 07/11/2023 1346   TCO2 26 07/11/2023 1346   ACIDBASEDEF 2.0 07/11/2023 1346   O2SAT 85.9 07/12/2023 0419   CBG (last 3)  Recent Labs    07/11/23 1946 07/11/23 2327 07/12/23 0417  GLUCAP 131* 143* 105*  EXAM More interactive Lungs: decreased Card: rr Ext: warm   ASSESSMENT: SP AVR, MV/TV repair MAZE Hemodynamics with ongoing inotropes. For RHC today and occlusion of fistula to hopefully have that permanently removed to  improve high output failure Renal: ongoing CRRT. Would like to have at least equal I &0s.  Nutrition; need swallow assessment to move forward with eating PPM once off pressors   Eugenio Hoes, MD 07/12/2023

## 2023-07-12 NOTE — Progress Notes (Signed)
[facility] Progress Note  Subjective: I/O +1.6 L yesterday. CRRT resumed after TPA to catheter. Keeping even except +50 cc/hr if CVP > 15.   Vitals:   07/12/23 1000 07/12/23 1015 07/12/23 1030 07/12/23 1045  BP:      Pulse: 80 80 80 80  Resp: (!) 21 18 19  (!) 22  Temp:      TempSrc:      SpO2: 100% 100% 100% 100%  Weight:      Height:        Physical Exam:       General adult male in bed on bipap Lungs clear but reduced to auscultation bilaterally  Heart S1S2 Abdomen soft nontender nondistended Extremities no edema appreciated Neuro - resting and awakens more easily; Oriented to person and year. Provides some history and answers some questions   Access RUE AVF with bruit and thrill; left internal jugular nontunneled dialysis catheter    OP HD: [place] triad MWF nocturnal 3h    F250     94kg   2K/2.5 Ca bath   RUE AVF    Summary: [name] is a/an 52 y.o. male with a past medical history notable for ESRD on HD admitted with severe MR and TR and aortic stenosis s/p repair/replacement.    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  Per cardiothoracic surgery  # Complete heart block - post-op, per EP/ cardiology   # Possible high-output heart failure - low RV function, high CO-OX at 86%, echo EF 70-75%. Going for RHC cath today.   # ESRD - pt was too hypotensive for hemodialysis post-op.  CRRT started 8/16 and remains on CRRT.   # HD access - temp cath clotted overnight. TPA worked and temp cath is back working again   [facility] Shock postoperatively - remains on 3 pressors + dobutamine gtt and po midodrine.    # Volume overload - improved. Wt's down significantly. Keeping even except pulling net neg 50 cc/hr if CVP > 15 (per CHF team).   # Pericardial effusion - per CT surgery and CHF.  We are optimizing volume status with CRRT  as above.  Assessed as moderate on last TTE   # Anemia of ESRD - transfuse as needed per primary team. Concurrent thrombocytopenia improved.  Aranesp increased to 150 mcg weekly on Tuesdays    # MBD ckd - hypercalcemia.  Note he was transitioned off of nepro after starting back on CRRT.  Phos acceptable.  No binders currently as on CRRT - normally on renvela.  Ordering PTH. Suspect due to immobility. Will try sensipar 30mg  every day.    # Vascular access - RUE AVF had been functioning well but have been unable to use due to shock and CRRT requirement.  Temporary dialysis catheter in place for CRRT.  [name]  MD  [facility] 07/12/2023, 11:02 AM  Recent Labs  Lab 07/11/23 0936 07/11/23 1346 07/11/23 1643 07/12/23 0422  HGB 9.2* 8.8*  --   --   ALBUMIN  --   --  2.4* 2.4*  CALCIUM  --   --  10.6* 10.5*  PHOS  --   --  3.9 3.1  CREATININE  --   --  2.85* 2.22*  K 4.4 4.7 4.2 4.3    Inpatient medications:  acyclovir  200 mg Per Tube  BID   aspirin  81 mg Per Tube Daily   atorvastatin  10 mg Per Tube QHS   azelastine  2 spray Each Nare BID   Chlorhexidine Gluconate Cloth  6 each Topical Daily   colchicine  0.3 mg Per Tube Once every 5 days   darbepoetin (ARANESP) injection - DIALYSIS  150 mcg Subcutaneous Q Tue-1800   docusate  100 mg Per Tube BID   escitalopram  10 mg Per Tube Daily   feeding supplement (PROSource TF20)  60 mL Per Tube Daily   folic acid  1 mg Per Tube Daily   guaiFENesin  15 mL Per Tube BID   heparin injection (subcutaneous)  5,000 Units Subcutaneous Q8H   insulin aspart  0-24 Units Subcutaneous Q4H   lidocaine  1 patch Transdermal Q24H   lidocaine-prilocaine  1 Application Topical Q M,W,F   metoCLOPramide  5 mg Per Tube TID   midodrine  20 mg Per Tube TID   multivitamin  1 tablet Per Tube BID   mouth rinse  15 mL Mouth Rinse 4 times per day   pantoprazole (PROTONIX) IV  40 mg Intravenous Q12H   polyethylene glycol  17 g Per Tube Daily   QUEtiapine  12.5 mg Per  Tube QHS   sodium chloride flush  3 mL Intravenous Q12H   thiamine  100 mg Oral Daily     prismasol BGK 4/2.5 400 mL/hr at 07/12/23 0302    prismasol BGK 4/2.5 400 mL/hr at 07/12/23 0302   sodium chloride 10 mL/hr at 07/12/23 1000   sodium chloride 10 mL/hr at 07/12/23 1000   sodium chloride 10 mL/hr at 07/12/23 1000   albumin human Stopped (06/23/23 0040)   DOBUTamine Stopped (07/12/23 0704)   epinephrine 10 mcg/min (07/12/23 1000)   feeding supplement (PIVOT 1.5 CAL) Stopped (07/11/23 2351)   norepinephrine (LEVOPHED) Adult infusion 4 mcg/min (07/12/23 1000)   prismasol BGK 4/2.5 1,800 mL/hr at 07/12/23 1011   vasopressin 0.04 Units/min (07/12/23 1000)   sodium chloride, sodium chloride, acetaminophen, albumin human, bisacodyl **OR** bisacodyl, clonazepam, heparin, ondansetron (ZOFRAN) IV, mouth rinse, oxyCODONE, phenol, simethicone, sodium chloride flush

## 2023-07-12 NOTE — Progress Notes (Signed)
Inpatient Rehab Admissions Coordinator:   Per therapy recommendations, patient was screened for CIR candidacy by Megan Salon, MS, CCC-SLP. At this time, Pt. is only participating in bed level-activities and is not at a level to tolerate the intensity of CIR. However,  Pt. may have potential to progress to becoming a potential CIR candidate, so CIR admissions team will follow and monitor for progress and participation with therapies and place consult order if Pt. appears to be an appropriate candidate. Please contact me with any questions.    Megan Salon, MS, CCC-SLP Rehab Admissions Coordinator  [phone] (celll) [phone] (office)

## 2023-07-12 NOTE — Progress Notes (Signed)
Pt transported from cathlab to 2H14 by RN and RT w/o complications

## 2023-07-12 NOTE — Progress Notes (Signed)
NAME:  [name], MRN:  [id number], DOB:  [birth year], LOS: 20 ADMISSION DATE:  06/22/2023, CONSULTATION DATE: 06/22/2023 REFERRING MD: Leafy Ro - TCTS, CHIEF COMPLAINT: Postcardiac surgery  History of Present Illness:  This is a 52 year old gentleman, past medical history of anxiety depression, bipolar disease, congestive heart failure chronic diastolic heart failure kidney disease followed by renal transplant in 1998-08-06 repeat renal transplant in 08/06/2014.  Patient underwent surgery today for severe mitral regurgitation severe tricuspid regurgitation and moderate aortic stenosis.  He also has atrial fibrillation received maze procedure.  He had a complex mitral valve repair a tricuspid valve annuloplasty and aortic valve replacement followed by full right and left maze procedure.  Pertinent Medical History:   Past Medical History:  Diagnosis Date   ADHD (attention deficit hyperactivity disorder)    Anemia    Anemia, chronic disease 07/13/2011   Anxiety    Arthritis    Bipolar 2 disorder (HCC) 08/18/2011   Diagnosed in 2010-08-06.    Bipolar disorder (HCC)    Blood dyscrasia    Nephrotic Anemia   Blood transfusion    Blood transfusion without reported diagnosis    Chronic diastolic CHF (congestive heart failure) (HCC)    Complication of anesthesia    Woke up intubated gets combative  afraid to be alone    Congestive heart failure (CHF) (HCC) 07/13/2011   Onset August 06, 2005.  Followed by Dr. Eden Emms.  S/p cardiac catheterization in [facility] Dr. Anne Fu..  S/p cardiac catheterization in 08/06/2013 by Nishan.  Echo Aug 06, 2013.    Depression    Dysrhythmia    A. Fib 2023-08-07 with DCCV in May 2024   Erectile dysfunction    ESRD on hemodialysis (HCC) 07/13/2011   Glomerulonephritis at age 70, started HD in August 07, 1995.  Deceased donor renal transplant 08/06/98 at [facility], then kidney failed and went back on HD in 08-06-05.  L forearm AVF . s/p repeat renal transplant 10/2014 [facility].   GI bleed 11/01/2011   Rectal bleeding with  emesis/diarrhea    Heart murmur    Hemodialysis status (HCC)    HSV (herpes simplex virus) infection    Hydrocele, right 11/25/2014   Hypertension    Hypertensive emergency 01/06/2013   Hypoglycemia 01/11/2013   Influenza-like illness 12/18/2013   Mild ascending aorta dilatation (HCC)    NICM (nonischemic cardiomyopathy) (HCC)    Pericardial effusion    a. Mod by echo in 08-06-13, similar to prior.   Peripheral polyneuropathy 08/04/2016   Pneumonia    Pulmonary edema 01/06/2013   Pulmonary hypertension associated with end stage renal disease on dialysis [facility]) 07/13/2011   Renal transplant, status post 11/25/2014   Pt with Glomerulonephritis at age 89. Deceased donor renal transplant 1998-08-06 right and left renal transplant 10/2014.  Followed by [facility] transplant team; nephrologist is Dr. Lonna Cobb.    Respiratory failure with hypoxia (HCC) 01/06/2013   Shortness of breath 12/12/2012   Swollen testicle 07/13/2011   URI (upper respiratory infection) 01/25/2012    Significant Hospital Events: Including procedures, antibiotic start and stop dates in addition to other pertinent events   8/15 - AVR, MVR, TVR with TCTS ([facility]). Remains on multiple pressors (NE, Epi, vaso). 8/16 - LIJ Trialysis catheter placed for CRRT.  8/17 extubated  8/18 - remains on CVVHD, 3 pressors  8/19-8/20 improving pressor needs 8/21 agitated delirium; cortrak 8/26 CRRT restarted 8/27 TVP placed, brady overnight and pacing turned to 80 8/30 palli consult  9/1 PRBC for low coox  9/2 coox down a  bit again  CRRT off overnight due to access issues. Remains on vaso 0.04, epi 10, norepi 2, DBA 5.  Interim History / Subjective:  CRRT has been back on and running since line cath-flowed. No complaints this morning. Wore bipap all night.   Objective:  Blood pressure (!) 102/42, pulse 80, temperature 97.9 F (36.6 C), temperature source Oral, resp. rate 19, height 6\' 3"  (1.905 m), weight 82.8 kg, SpO2 100%. CVP:   [13 mmHg-21 mmHg] 15 mmHg  FiO2 (%):  [30 %] 30 %   Intake/Output Summary (Last 24 hours) at 07/12/2023 1231 Last data filed at 07/12/2023 1200 Gross per 24 hour  Intake 2691.79 ml  Output 2972.7 ml  Net -280.91 ml   Filed Weights   07/10/23 0500 07/11/23 0500 07/12/23 0500  Weight: 78.9 kg 78.8 kg 82.8 kg   Physical Examination:    General: chronically ill appearing man lying in bed in NAD HEENT: [place]/AT, eyes anicteric   CV: paced rhythm, S1S2,   Pulm: breathing comfortably on BiPAP, CTAB GI: soft, NT, transplanted kidney in RLQ  Derm: warm, dry, no rashes MSK: minimal muscle mass, RUE fistula with thrill Neuro: more awake this morning, answering questions, globally weak but moving extremities  Echo 9/1: moderate pericardial effusion, no tamponade. LVEF 60-65%  Resolved issues:   Possible PNA. Right pleural effusion - s/p pigtail chest tube with subsequent removal 8/25.   Assessment & Plan:   ICU delirium, acute encephalopathy  - improving.  -nocturnal BiPAP + PRN -delirium precautions -OOB mobility as able; CRRT and bipap have limited this -con't reduced dose seroquel and clonazpam; con't lexapro  Acute respiratory failure with hypoxia & hypercapnia -bipap PRN -supplemental O2 to maintain SpO2 >90%; titrate as able -pulmonary hygiene -volume management with CRRT; getting large volume of fluids with multiple inotropes  MVR, TVR, AS s/p bioprosthetic replacement CHB/ post op junctional rhythm s/p TVP (8/27) Cardiogenic shock, RV failure, high output heart failure Enlarging pericardial effusion w/o tamponade AoC HFrEF> currently EF recovered Afib s/p MAZE  -post op distributive/vasoplegia, improved. Possible sepsis now off abx. Has RV failure. Pericardial effusion without tamponade physiology P -appreciate TCTS management -RHC today with occlusion of fistula; if improved will need fistula ligation per vascular surgery.  -Appreciate EP's management. TVP-dependent  currently.  -Goal SBP >95; con't epi, NE, vasopressin. DBA weaned off. Serial coox-- all have been high.  -con't midodrine 20mg  TID -Unfortunately, if there is not RV recovery we may be approaching EOL despite multidisciplinary best efforts. So far patient has been unwilling to consider deescalation of care. He has asked Palliative care to no longer be involved in his care team.   ESRD; CKD s/p failed renal transplants x 2 (1999, 2015)  Hypophosphatemia Hyponatremia  Hypokalemia  Hypercalcemia  Hypermagnesemia  History of kidney disease status post renal transplant (failed 1999 and 2015). P -CRRT per nephrology; has been intolerant to iHD due to inotrope requirements -monitor electrolytes and replete as needed  H/o HSV -con't PTA acyclovir    Anemia - expected ABLA (resolved), critical illness, chronic dz  -transfuse for Hb <7 or hemodynamically significant bleeding -Aranesp per nephrology  Moderate malnutrition  -con't advancing TF once able to restart post-procedurally -reglan -SLP eventually can retest swallow  Possible gout flare - con't colchicine  Physical deconditioning  -needs PT, OT, anticipate she will need CIR  GOC -Family had been updated yesterday. Full aggressive care desired. No family at bedside today.    Best Practice (right click and "Reselect  all SmartList Selections" daily)   Diet/type:TF  DVT prophylaxis:heparin ppx GI prophylaxis: PPI Lines: Central line, Dialysis Catheter, and Arterial Line Foley:  No  Code Status:  full code Last date of multidisciplinary goals of care discussion [Per Primary Team]  This patient is critically ill with multiple organ system failure which requires frequent high complexity decision making, assessment, support, evaluation, and titration of therapies. This was completed through the application of advanced monitoring technologies and extensive interpretation of multiple databases. During this encounter critical care  time was devoted to patient care services described in this note for 38 minutes.  Steffanie Dunn, DO 07/12/23 12:44 PM [facility]  For contact information, see Amion. If no response to pager, please call PCCM consult pager. After hours, 7PM- 7AM, please call Elink.

## 2023-07-12 NOTE — Interval H&P Note (Signed)
History and Physical Interval Note:  07/12/2023 2:02 PM  [name]  has presented today for surgery, with the diagnosis of heart failure.  The various methods of treatment have been discussed with the patient and family. After consideration of risks, benefits and other options for treatment, the patient has consented to  Procedure(s): RIGHT HEART CATH (N/A) as a surgical intervention.  The patient's history has been reviewed, patient examined, no change in status, stable for surgery.  I have reviewed the patient's chart and labs.  Questions were answered to the patient's satisfaction.     [name]

## 2023-07-12 NOTE — Progress Notes (Addendum)
   VASCULAR SURGERY ASSESSMENT & PLAN:   END-STAGE RENAL DISEASE: He has an excellent thrill in his right upper arm fistula.  If his heart function improves with occlusion of his fistula today then we can arrange ligation of his fistula.  Cari Caraway, MD 6:55 AM    Awaiting results of Cardiac cath with occlusion of AV fistula pending surgery to ligate fistula to assist with cardiac function.  Mosetta Pigeon PA-C [facility]

## 2023-07-12 NOTE — Progress Notes (Signed)
Physical Therapy Treatment Patient Details Name: [name] MRN: [id number] DOB: Mar 26, 1971 Today's Date: 07/12/2023   History of Present Illness Pt is a 52 y.o. male admitted 06/22/23 for same day planned tricuspid valve repair, mitral valve repair, aortic valve replacement, maze, TEE. Post-op course complicated by CHB, [name], AKI, respiratory failure. CRRT initiated 8/16, then resumed 8/26. ETT 8/15-8/17. PMH includes CHF, HTN, NICM, polyneuropathy, ESRD on HD (s/p renal transplant 1999, 2015), bipolar, ADHD, anxiety, depression.    PT Comments  Pt remains on strict bedrest due to pacer wires in neck in addition to CRRT. Pt eager to mobilize and complete bed level exercises with PT. Used footboard of bed to push off of. Pt with noted bilat hip tightness as well and generalized weakness. Acute PT to cont to follow and progress mobility as able, as allowed.     If plan is discharge home, recommend the following: A lot of help with walking and/or transfers;A lot of help with bathing/dressing/bathroom;Assistance with cooking/housework;Assist for transportation;Help with stairs or ramp for entrance   Can travel by private vehicle        Equipment Recommendations   (TBD)    Recommendations for Other Services       Precautions / Restrictions Precautions Precautions: Fall;Sternal Precaution Booklet Issued: No Precaution Comments: CRRT, external pacer in neck Restrictions Weight Bearing Restrictions: Yes RUE Weight Bearing: Non weight bearing LUE Weight Bearing: Non weight bearing Other Position/Activity Restrictions: sternal prec     Mobility  Bed Mobility               General bed mobility comments: limited to bed level exercises as pt remains on bed rest.    Transfers                        Ambulation/Gait                   Stairs             Wheelchair Mobility     Tilt Bed    Modified Rankin (Stroke Patients Only)       Balance                                             Cognition Arousal: Alert Behavior During Therapy: Flat affect Overall Cognitive Status: Within Functional Limits for tasks assessed                                 General Comments: pt eager to mobilize, able to follow commands, limited communication due to being on bipap        Exercises General Exercises - Lower Extremity Short Arc Quad: AROM, Both, 15 reps, Supine Hip ABduction/ADduction: AROM, Both, 15 reps, Supine (squeezing pillow between knees) Hip Flexion/Marching: AAROM, Both, 10 reps, Supine Heel Raises: AROM, Both, 15 reps, Supine (pushing off foot board) Mini-Sqauts: AROM, Both, 15 reps, Supine (pushing off foot board)    General Comments        Pertinent Vitals/Pain Pain Assessment Pain Assessment: Faces Faces Pain Scale: Hurts little more Pain Location: R knee pain with exercise Pain Descriptors / Indicators: Discomfort, Grimacing Pain Intervention(s): Limited activity within patient's tolerance    Home Living  Prior Function            PT Goals (current goals can now be found in the care plan section) Acute Rehab PT Goals PT Goal Formulation: With patient Time For Goal Achievement: 07/26/23 Potential to Achieve Goals: Fair Progress towards PT goals: Not progressing toward goals - comment (due to continued bedrest)    Frequency    Min 1X/week      PT Plan      Co-evaluation              AM-PAC PT "6 Clicks" Mobility   Outcome Measure  Help needed turning from your back to your side while in a flat bed without using bedrails?: Total Help needed moving from lying on your back to sitting on the side of a flat bed without using bedrails?: Total Help needed moving to and from a bed to a chair (including a wheelchair)?: Total Help needed standing up from a chair using your arms (e.g., wheelchair or bedside chair)?: Total Help needed  to walk in hospital room?: Total Help needed climbing 3-5 steps with a railing? : Total 6 Click Score: 6    End of Session Equipment Utilized During Treatment:  (bipap) Activity Tolerance: Patient limited by fatigue Patient left: in bed;with call bell/phone within reach;with bed alarm set;with nursing/sitter in room Nurse Communication: Mobility status PT Visit Diagnosis: Other abnormalities of gait and mobility (R26.89);Muscle weakness (generalized) (M62.81)     Time: 7846-9629 PT Time Calculation (min) (ACUTE ONLY): 19 min  Charges:    $Therapeutic Exercise: 8-22 mins PT General Charges $$ ACUTE PT VISIT: 1 Visit                     [name], PT, DPT Acute Rehabilitation Services Secure chat preferred Office #: [phone]    [name] 07/12/2023, 12:25 PM

## 2023-07-12 NOTE — H&P (View-Only) (Signed)
Advanced Heart Failure Rounding Note  PCP-Cardiologist: Reatha Harps, MD   Subjective:   8/15: Elective triple vale surgery + MAZE. Underwent aortic valve replacement + mitral and tricuspid valve repairs by Dr. Milinda Antis 8/22: AHF consulted for high-output HF with persistent post-op shock 8/25: CRRT stopped 8/26: CRRT restarted  08/27: S/p TTVP Echo: EF 70-75%, severe LVH, RV mod reduced, RV severely enlarged. LA/RA mod dilated. Large pericardial effusion. No evidence of tamponade.   Currently on 10 Epi + 5 NE + 0.04 Vaso + 2.5 DBA. Midodrine 20 tid CO-OX 86% (poss sepsis + AV fistula).  Remains in CVVHD. Keeping even for now.   Remains on TFs.   Wore bipap all night. Alert and oriented this morning. Plan RHC today.   Objective:    Weight Range: 82.8 kg Body mass index is 22.82 kg/m.   Vital Signs:   Temp:  [96.6 F (35.9 C)-98.8 F (37.1 C)] 98.8 F (37.1 C) (09/03 1947) Pulse Rate:  [80-88] 82 (09/04 0700) Resp:  [11-31] 19 (09/04 0700) SpO2:  [95 %-100 %] 100 % (09/04 0700) Arterial Line BP: (87-123)/(39-62) 111/47 (09/04 0700) FiO2 (%):  [30 %] 30 % (09/03 1951) Weight:  [82.8 kg] 82.8 kg (09/04 0500) Last BM Date : 07/11/23  Weight change: Filed Weights   07/10/23 0500 07/11/23 0500 07/12/23 0500  Weight: 78.9 kg 78.8 kg 82.8 kg   Intake/Output:   Intake/Output Summary (Last 24 hours) at 07/12/2023 0745 Last data filed at 07/12/2023 0700 Gross per 24 hour  Intake 2920.14 ml  Output 2185.3 ml  Net 734.84 ml    Physical Exam  General:  weak appearing.  No respiratory difficulty HEENT: normal. +cortak Neck: supple. JVD elevated. Carotids 2+ bilat; no bruits. No lymphadenopathy or thyromegaly appreciated. [facility] CVC Cor: PMI nondisplaced. Regular rate & rhythm. No rubs, gallops or murmurs. Lungs: clear Abdomen: soft, nontender, nondistended. No hepatosplenomegaly. No bruits or masses. Good bowel sounds. Extremities: no cyanosis, clubbing, rash, edema.  Fistula RUE +bruit/thrill. L brachial a line.  GU: +flexi Neuro: alert & oriented x 3, cranial nerves grossly intact. moves all 4 extremities w/o difficulty. Affect pleasant.  Telemetry   V paced 80s. Personally reviewed  Labs    CBC Recent Labs    07/10/23 0319 07/10/23 1629 07/11/23 0334 07/11/23 0803 07/11/23 0936 07/11/23 1346  WBC 6.5  --  5.5  --   --   --   HGB 8.4*   < > 7.8*   < > 9.2* 8.8*  HCT 28.1*   < > 26.3*   < > 27.0* 26.0*  MCV 108.1*  --  107.8*  --   --   --   PLT 156  --  151  --   --   --    < > = values in this interval not displayed.   Basic Metabolic Panel Recent Labs    56/21/30 0334 07/11/23 0803 07/11/23 1643 07/12/23 0422  NA 134*   < > 131* 132*  K 4.1   < > 4.2 4.3  CL 99  --  102 100  CO2 25  --  22 26  GLUCOSE 145*  --  126* 109*  BUN 39*  --  43* 28*  CREATININE 2.41*  --  2.85* 2.22*  CALCIUM 10.7*  --  10.6* 10.5*  MG 2.5*  --   --  2.6*  PHOS 3.5  --  3.9 3.1   < > = values in this interval  not displayed.   Liver Function Tests Recent Labs    07/11/23 1643 07/12/23 0422  ALBUMIN 2.4* 2.4*   No results for input(s): "LIPASE", "AMYLASE" in the last 72 hours. Cardiac Enzymes No results for input(s): "CKTOTAL", "CKMB", "CKMBINDEX", "TROPONINI" in the last 72 hours.  BNP: BNP (last 3 results) No results for input(s): "BNP" in the last 8760 hours.  ProBNP (last 3 results) No results for input(s): "PROBNP" in the last 8760 hours.  D-Dimer No results for input(s): "DDIMER" in the last 72 hours. Hemoglobin A1C No results for input(s): "HGBA1C" in the last 72 hours. Fasting Lipid Panel No results for input(s): "CHOL", "HDL", "LDLCALC", "TRIG", "CHOLHDL", "LDLDIRECT" in the last 72 hours. Thyroid Function Tests No results for input(s): "TSH", "T4TOTAL", "T3FREE", "THYROIDAB" in the last 72 hours.  Invalid input(s): "FREET3"  Other results:  Imaging   No results found.  Medications:     Scheduled Medications:   acyclovir  200 mg Per Tube BID   aspirin  81 mg Per Tube Daily   atorvastatin  10 mg Per Tube QHS   azelastine  2 spray Each Nare BID   Chlorhexidine Gluconate Cloth  6 each Topical Daily   colchicine  0.3 mg Per Tube Once every 5 days   darbepoetin (ARANESP) injection - DIALYSIS  150 mcg Subcutaneous Q Tue-1800   docusate  100 mg Per Tube BID   escitalopram  10 mg Per Tube Daily   feeding supplement (PROSource TF20)  60 mL Per Tube Daily   folic acid  1 mg Per Tube Daily   guaiFENesin  15 mL Per Tube BID   heparin injection (subcutaneous)  5,000 Units Subcutaneous Q8H   insulin aspart  0-24 Units Subcutaneous Q4H   lidocaine  1 patch Transdermal Q24H   lidocaine-prilocaine  1 Application Topical Q M,W,F   metoCLOPramide  5 mg Per Tube TID   midodrine  20 mg Per Tube TID   multivitamin  1 tablet Per Tube BID   mouth rinse  15 mL Mouth Rinse 4 times per day   pantoprazole (PROTONIX) IV  40 mg Intravenous Q12H   polyethylene glycol  17 g Per Tube Daily   QUEtiapine  12.5 mg Per Tube QHS   sodium chloride flush  3 mL Intravenous Q12H   thiamine  100 mg Oral Daily    Infusions:   prismasol BGK 4/2.5 400 mL/hr at 07/12/23 0302    prismasol BGK 4/2.5 400 mL/hr at 07/12/23 0302   sodium chloride 10 mL/hr at 07/12/23 0700   sodium chloride 10 mL/hr at 07/12/23 0600   sodium chloride 10 mL/hr at 07/12/23 0700   albumin human Stopped (06/23/23 0040)   DOBUTamine 2.5 mcg/kg/min (07/12/23 0700)   epinephrine 10 mcg/min (07/12/23 0700)   feeding supplement (PIVOT 1.5 CAL) Stopped (07/11/23 2351)   norepinephrine (LEVOPHED) Adult infusion 6 mcg/min (07/12/23 0700)   prismasol BGK 4/2.5 1,800 mL/hr at 07/12/23 0725   vasopressin 0.04 Units/min (07/12/23 0700)    PRN Medications: sodium chloride, sodium chloride, acetaminophen, albumin human, bisacodyl **OR** bisacodyl, clonazepam, heparin, ondansetron (ZOFRAN) IV, mouth rinse, oxyCODONE, phenol, simethicone, sodium chloride flush,  traMADol  Patient Profile   52 y/o AAM w/ mildly reduced systolic heart failure (EF once 45-50% in 2013 but normal on subsequent echos) valvular heart disease including severe AS, 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 EF 55-60% w/ mild RV dysfunction by echo in 6/24.    Admitted for elective triple vale surgery + MAZE. S/p bioprosthetic aortic valve replacement + mitral and tricuspid valve repairs. Post-op course c/b persistent vasoplagic syndrome.   Assessment/Plan  1. Post-op Vasopalegia  - Echo 06/29/23 EF 60-65%, no RWMA, small pericardial effusion present, MV repaired/replaced, trivial MR. TR repaired/replaced, mild TR. AV repaired/replaced.  - Echo 07/04/23: EF 70-75%, severe concentric LVH, RV mod reduced, RV severely enlarged. LA/RA mod dilated. Large pericardial effusion. No evidence of tamponade - Echo 08/29: Moderate pericardial effusion, no tamponade - Suspected high output HF 2/2 AV fistula  - POD #19 remains dependent on high-dose pressors - PCT elevated but in setting of ESRD. Completed Zosyn for possible septic component - CVP 17 Targeting CVP 12-13 d/t severe RV failure. Suspect we can start pulling after RHC today - CVVHD currently on hold while line is being TPA'd. Has not tolerated volume removal well. - Plan to turn off DBA this morning.  - Plan for RHC today with fistula occlusion. Vascular following.  - We still are not making any meaningful progress. Need to try to wean pressors. If unable to wean then need to consider comfort care.    2.  Acute on Chronic Systolic Heart Failure w/ Prominent RV Dysfunction  - LVEF once 45-50% in 2013 but normal on subsequent echos. Pre-op TTE 6/25 EF 55-60%, GIIIDD (Restrictive) RV mildly reduced, mod PH w/ estimated RVSP 48 mmHg  - Intra-op TEE (while on 4 mcg of Epi) LVEF 45%, RV  mod-severely reduced, stable AV prosthesis w/ normal gradient and stable valvuloplasty rings w/ mod residual MS, mean gradient 7 mmgH.   - echo 07/04/23: EF 70-75%, severe concentric LVH, RV mod reduced, RV severely enlarged. LA/RA mod dilated. Large pericardial effusion. No evidence of tamponade.  - Echo 08/29: EF 65-70%, RV moderately reduced, pericardial effusion slightly smaller, no tamponade - Continue Midodrine 20 mg TID - Inotropes/pressors and volume management as above. Continue to wean as toelrated  3. 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 Post-op CHB  - EP following. - S/p RIJ temp wire 08/27 - EPW left in place d/t concern for risk of tamponade - V paced 80. Underneath sinus with variable PR 50-60s. BP optimized with VPacing at 85-90   5. 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 Nephrology following - Plan as above   6. H/o Atrial Fibrillation  - S/p MAZE    7. Thrombocytopenia/ Anemia - Thrombocytopenia resolved - Hgb 8.8 today  8. ID: ?sepsis component  - Completed Zosyn per CCM.  - PCT elevated in setting of ESRD  9. Large pericardial effusion - seen on echo 07/04/23. Previously small on echo 06/29/23 - no evidence of tamponade - Limited echo 08/29 stable  10. GOC - palliative care initially following. Now signed off 9/2 per patient request - remains full code - remains tenuous with high pressor requirements. Plans discussed with parents at bedside 9/3  Length of Stay: 20  Alen Bleacher, NP  07/12/2023, 7:45 AM  Advanced Heart Failure Team Pager [phone] (M-F; 7a - 5p)  Please contact [facility] for night-coverage after hours (5p -7a ) and weekends on  [email or web address]  Agree with above.   More alert today. Now off DBA  but remains on multiple other pressors. Keeping even on CVVHD. CVP 17-19. Co-ox still > 80%   Denies CP or SOB   General:  Weak appearing. No resp difficulty HEENT: normal Neck: supple. + HD cath  Cor:  Regular rate & rhythm. No rubs, gallops or murmurs. Lungs: clear Abdomen: soft, nontender, nondistended. No hepatosplenomegaly. No bruits or masses. Good bowel sounds. Extremities: no cyanosis, clubbing, rash, 1+ edema RUE AVF Neuro: alert & orientedx3, cranial nerves grossly intact. moves all 4 extremities w/o difficulty. Affect pleasant  Still pressor dependent but somewhat improved. Suspect combination of severe RV failure and possible low SVR due to AVF. Will take to cath lab today for RHC with AVF occlusion trial.   With CVP > 15 would pull -50/hr on CVVHD  CRITICAL CARE Performed by: Arvilla Meres  Total critical care time: 45 minutes  Critical care time was exclusive of separately billable procedures and treating other patients.  Critical care was necessary to treat or prevent imminent or life-threatening deterioration.  Critical care was time spent personally by me (independent of midlevel providers or residents) on the following activities: development of treatment plan with patient and/or surrogate as well as nursing, discussions with consultants, evaluation of patient's response to treatment, examination of patient, obtaining history from patient or surrogate, ordering and performing treatments and interventions, ordering and review of laboratory studies, ordering and review of radiographic studies, pulse oximetry and re-evaluation of patient's condition.  Arvilla Meres, MD  9:12 AM

## 2023-07-12 NOTE — Progress Notes (Signed)
SLP Cancellation Note  Patient Details Name: [name] MRN: [id number] DOB: January 09, 1971   Cancelled treatment:        Attempted to see pt for PO trials.  Pt is NPO at present for R heart cath this morning.  He will have a fistula check with possible vascular surgery later today.  Reach out to SLP after procedures if pt will be able to take PO trials later today.   Kerrie Pleasure, MA, CCC-SLP Acute Rehabilitation Services Office: [phone] 07/12/2023, 9:42 AM

## 2023-07-12 NOTE — Interval H&P Note (Signed)
History and Physical Interval Note:  07/12/2023 2:01 PM  [name]  has presented today for surgery, with the diagnosis of heart failure.  The various methods of treatment have been discussed with the patient and family. After consideration of risks, benefits and other options for treatment, the patient has consented to  Procedure(s): RIGHT HEART CATH (N/A) as a surgical intervention.  The patient's history has been reviewed, patient examined, no change in status, stable for surgery.  I have reviewed the patient's chart and labs.  Questions were answered to the patient's satisfaction.     Danyel Griess

## 2023-07-12 NOTE — Progress Notes (Addendum)
Advanced Heart Failure Rounding Note  PCP-Cardiologist: Reatha Harps, MD   Subjective:   8/15: Elective triple vale surgery + MAZE. Underwent aortic valve replacement + mitral and tricuspid valve repairs by Dr. Milinda Antis 8/22: AHF consulted for high-output HF with persistent post-op shock 8/25: CRRT stopped 8/26: CRRT restarted  08/27: S/p TTVP Echo: EF 70-75%, severe LVH, RV mod reduced, RV severely enlarged. LA/RA mod dilated. Large pericardial effusion. No evidence of tamponade.   Currently on 10 Epi + 5 NE + 0.04 Vaso + 2.5 DBA. Midodrine 20 tid CO-OX 86% (poss sepsis + AV fistula).  Remains in CVVHD. Keeping even for now.   Remains on TFs.   Wore bipap all night. Alert and oriented this morning. Plan RHC today.   Objective:    Weight Range: 82.8 kg Body mass index is 22.82 kg/m.   Vital Signs:   Temp:  [96.6 F (35.9 C)-98.8 F (37.1 C)] 98.8 F (37.1 C) (09/03 1947) Pulse Rate:  [80-88] 82 (09/04 0700) Resp:  [11-31] 19 (09/04 0700) SpO2:  [95 %-100 %] 100 % (09/04 0700) Arterial Line BP: (87-123)/(39-62) 111/47 (09/04 0700) FiO2 (%):  [30 %] 30 % (09/03 1951) Weight:  [82.8 kg] 82.8 kg (09/04 0500) Last BM Date : 07/11/23  Weight change: Filed Weights   07/10/23 0500 07/11/23 0500 07/11/2023 0500  Weight: 78.9 kg 78.8 kg 82.8 kg   Intake/Output:   Intake/Output Summary (Last 24 hours) at 07/27/2023 0745 Last data filed at 07/09/2023 0700 Gross per 24 hour  Intake 2920.14 ml  Output 2185.3 ml  Net 734.84 ml    Physical Exam  General:  weak appearing.  No respiratory difficulty HEENT: normal. +cortak Neck: supple. JVD elevated. Carotids 2+ bilat; no bruits. No lymphadenopathy or thyromegaly appreciated. [facility] CVC Cor: PMI nondisplaced. Regular rate & rhythm. No rubs, gallops or murmurs. Lungs: clear Abdomen: soft, nontender, nondistended. No hepatosplenomegaly. No bruits or masses. Good bowel sounds. Extremities: no cyanosis, clubbing, rash, edema.  Fistula RUE +bruit/thrill. L brachial a line.  GU: +flexi Neuro: alert & oriented x 3, cranial nerves grossly intact. moves all 4 extremities w/o difficulty. Affect pleasant.  Telemetry   V paced 80s. Personally reviewed  Labs    CBC Recent Labs    07/10/23 0319 07/10/23 1629 07/11/23 0334 07/11/23 0803 07/11/23 0936 07/11/23 1346  WBC 6.5  --  5.5  --   --   --   HGB 8.4*   < > 7.8*   < > 9.2* 8.8*  HCT 28.1*   < > 26.3*   < > 27.0* 26.0*  MCV 108.1*  --  107.8*  --   --   --   PLT 156  --  151  --   --   --    < > = values in this interval not displayed.   Basic Metabolic Panel Recent Labs    86/57/84 0334 07/11/23 0803 07/11/23 1643 07/30/2023 0422  NA 134*   < > 131* 132*  K 4.1   < > 4.2 4.3  CL 99  --  102 100  CO2 25  --  22 26  GLUCOSE 145*  --  126* 109*  BUN 39*  --  43* 28*  CREATININE 2.41*  --  2.85* 2.22*  CALCIUM 10.7*  --  10.6* 10.5*  MG 2.5*  --   --  2.6*  PHOS 3.5  --  3.9 3.1   < > = values in this interval  not displayed.   Liver Function Tests Recent Labs    07/11/23 1643 07/23/2023 0422  ALBUMIN 2.4* 2.4*   No results for input(s): "LIPASE", "AMYLASE" in the last 72 hours. Cardiac Enzymes No results for input(s): "CKTOTAL", "CKMB", "CKMBINDEX", "TROPONINI" in the last 72 hours.  BNP: BNP (last 3 results) No results for input(s): "BNP" in the last 8760 hours.  ProBNP (last 3 results) No results for input(s): "PROBNP" in the last 8760 hours.  D-Dimer No results for input(s): "DDIMER" in the last 72 hours. Hemoglobin A1C No results for input(s): "HGBA1C" in the last 72 hours. Fasting Lipid Panel No results for input(s): "CHOL", "HDL", "LDLCALC", "TRIG", "CHOLHDL", "LDLDIRECT" in the last 72 hours. Thyroid Function Tests No results for input(s): "TSH", "T4TOTAL", "T3FREE", "THYROIDAB" in the last 72 hours.  Invalid input(s): "FREET3"  Other results:  Imaging   No results found.  Medications:     Scheduled Medications:   acyclovir  200 mg Per Tube BID   aspirin  81 mg Per Tube Daily   atorvastatin  10 mg Per Tube QHS   azelastine  2 spray Each Nare BID   Chlorhexidine Gluconate Cloth  6 each Topical Daily   colchicine  0.3 mg Per Tube Once every 5 days   darbepoetin (ARANESP) injection - DIALYSIS  150 mcg Subcutaneous Q Tue-1800   docusate  100 mg Per Tube BID   escitalopram  10 mg Per Tube Daily   feeding supplement (PROSource TF20)  60 mL Per Tube Daily   folic acid  1 mg Per Tube Daily   guaiFENesin  15 mL Per Tube BID   heparin injection (subcutaneous)  5,000 Units Subcutaneous Q8H   insulin aspart  0-24 Units Subcutaneous Q4H   lidocaine  1 patch Transdermal Q24H   lidocaine-prilocaine  1 Application Topical Q M,W,F   metoCLOPramide  5 mg Per Tube TID   midodrine  20 mg Per Tube TID   multivitamin  1 tablet Per Tube BID   mouth rinse  15 mL Mouth Rinse 4 times per day   pantoprazole (PROTONIX) IV  40 mg Intravenous Q12H   polyethylene glycol  17 g Per Tube Daily   QUEtiapine  12.5 mg Per Tube QHS   sodium chloride flush  3 mL Intravenous Q12H   thiamine  100 mg Oral Daily    Infusions:   prismasol BGK 4/2.5 400 mL/hr at 08/04/2023 0302    prismasol BGK 4/2.5 400 mL/hr at 07/15/2023 0302   sodium chloride 10 mL/hr at 07/21/2023 0700   sodium chloride 10 mL/hr at 08/01/2023 0600   sodium chloride 10 mL/hr at 08/03/2023 0700   albumin human Stopped (06/23/23 0040)   DOBUTamine 2.5 mcg/kg/min (08/03/2023 0700)   epinephrine 10 mcg/min (07/29/2023 0700)   feeding supplement (PIVOT 1.5 CAL) Stopped (07/11/23 2351)   norepinephrine (LEVOPHED) Adult infusion 6 mcg/min (07/28/2023 0700)   prismasol BGK 4/2.5 1,800 mL/hr at 07/15/2023 0725   vasopressin 0.04 Units/min (07/17/2023 0700)    PRN Medications: sodium chloride, sodium chloride, acetaminophen, albumin human, bisacodyl **OR** bisacodyl, clonazepam, heparin, ondansetron (ZOFRAN) IV, mouth rinse, oxyCODONE, phenol, simethicone, sodium chloride flush,  traMADol  Patient Profile   52 y/o AAM w/ mildly reduced systolic heart failure (EF once 45-50% in 2013 but normal on subsequent echos) valvular heart disease including severe AS, 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 EF 55-60% w/ mild RV dysfunction by echo in 6/24.    Admitted for elective triple vale surgery + MAZE. S/p bioprosthetic aortic valve replacement + mitral and tricuspid valve repairs. Post-op course c/b persistent vasoplagic syndrome.   Assessment/Plan  1. Post-op Vasopalegia  - Echo 06/29/23 EF 60-65%, no RWMA, small pericardial effusion present, MV repaired/replaced, trivial MR. TR repaired/replaced, mild TR. AV repaired/replaced.  - Echo 07/06/2023: EF 70-75%, severe concentric LVH, RV mod reduced, RV severely enlarged. LA/RA mod dilated. Large pericardial effusion. No evidence of tamponade - Echo 08/29: Moderate pericardial effusion, no tamponade - Suspected high output HF 2/2 AV fistula  - POD #19 remains dependent on high-dose pressors - PCT elevated but in setting of ESRD. Completed Zosyn for possible septic component - CVP 17 Targeting CVP 12-13 d/t severe RV failure. Suspect we can start pulling after RHC today - CVVHD currently on hold while line is being TPA'd. Has not tolerated volume removal well. - Plan to turn off DBA this morning.  - Plan for RHC today with fistula occlusion. Vascular following.  - We still are not making any meaningful progress. Need to try to wean pressors. If unable to wean then need to consider comfort care.    2.  Acute on Chronic Systolic Heart Failure w/ Prominent RV Dysfunction  - LVEF once 45-50% in 2013 but normal on subsequent echos. Pre-op TTE 6/25 EF 55-60%, GIIIDD (Restrictive) RV mildly reduced, mod PH w/ estimated RVSP 48 mmHg  - Intra-op TEE (while on 4 mcg of Epi) LVEF 45%, RV  mod-severely reduced, stable AV prosthesis w/ normal gradient and stable valvuloplasty rings w/ mod residual MS, mean gradient 7 mmgH.   - echo 07/05/2023: EF 70-75%, severe concentric LVH, RV mod reduced, RV severely enlarged. LA/RA mod dilated. Large pericardial effusion. No evidence of tamponade.  - Echo 08/29: EF 65-70%, RV moderately reduced, pericardial effusion slightly smaller, no tamponade - Continue Midodrine 20 mg TID - Inotropes/pressors and volume management as above. Continue to wean as toelrated  3. 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 Post-op CHB  - EP following. - S/p RIJ temp wire 08/27 - EPW left in place d/t concern for risk of tamponade - V paced 80. Underneath sinus with variable PR 50-60s. BP optimized with VPacing at 85-90   5. 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 Nephrology following - Plan as above   6. H/o Atrial Fibrillation  - S/p MAZE    7. Thrombocytopenia/ Anemia - Thrombocytopenia resolved - Hgb 8.8 today  8. ID: ?sepsis component  - Completed Zosyn per CCM.  - PCT elevated in setting of ESRD  9. Large pericardial effusion - seen on echo 06/28/2023. Previously small on echo 06/29/23 - no evidence of tamponade - Limited echo 08/29 stable  10. GOC - palliative care initially following. Now signed off 9/2 per patient request - remains full code - remains tenuous with high pressor requirements. Plans discussed with parents at bedside 9/3  Length of Stay: 20  Alen Bleacher, NP  07/25/2023, 7:45 AM  Advanced Heart Failure Team Pager [phone] (M-F; 7a - 5p)  Please contact [facility] for night-coverage after hours (5p -7a ) and weekends on  [email or web address]  Agree with above.   More alert today. Now off DBA  but remains on multiple other pressors. Keeping even on CVVHD. CVP 17-19. Co-ox still > 80%   Denies CP or SOB   General:  Weak appearing. No resp difficulty HEENT: normal Neck: supple. + HD cath  Cor:  Regular rate & rhythm. No rubs, gallops or murmurs. Lungs: clear Abdomen: soft, nontender, nondistended. No hepatosplenomegaly. No bruits or masses. Good bowel sounds. Extremities: no cyanosis, clubbing, rash, 1+ edema RUE AVF Neuro: alert & orientedx3, cranial nerves grossly intact. moves all 4 extremities w/o difficulty. Affect pleasant  Still pressor dependent but somewhat improved. Suspect combination of severe RV failure and possible low SVR due to AVF. Will take to cath lab today for RHC with AVF occlusion trial.   With CVP > 15 would pull -50/hr on CVVHD  CRITICAL CARE Performed by: Arvilla Meres  Total critical care time: 45 minutes  Critical care time was exclusive of separately billable procedures and treating other patients.  Critical care was necessary to treat or prevent imminent or life-threatening deterioration.  Critical care was time spent personally by me (independent of midlevel providers or residents) on the following activities: development of treatment plan with patient and/or surrogate as well as nursing, discussions with consultants, evaluation of patient's response to treatment, examination of patient, obtaining history from patient or surrogate, ordering and performing treatments and interventions, ordering and review of laboratory studies, ordering and review of radiographic studies, pulse oximetry and re-evaluation of patient's condition.  Arvilla Meres, MD  9:12 AM

## 2023-07-13 ENCOUNTER — Encounter (HOSPITAL_COMMUNITY): Payer: Self-pay | Admitting: Internal Medicine

## 2023-07-13 ENCOUNTER — Other Ambulatory Visit: Payer: Self-pay

## 2023-07-13 DIAGNOSIS — F410 Panic disorder [episodic paroxysmal anxiety] without agoraphobia: Secondary | ICD-10-CM

## 2023-07-13 DIAGNOSIS — N186 End stage renal disease: Secondary | ICD-10-CM | POA: Diagnosis not present

## 2023-07-13 DIAGNOSIS — I5023 Acute on chronic systolic (congestive) heart failure: Secondary | ICD-10-CM | POA: Diagnosis not present

## 2023-07-13 DIAGNOSIS — Z9889 Other specified postprocedural states: Secondary | ICD-10-CM | POA: Diagnosis not present

## 2023-07-13 DIAGNOSIS — I2729 Other secondary pulmonary hypertension: Secondary | ICD-10-CM | POA: Diagnosis not present

## 2023-07-13 DIAGNOSIS — R570 Cardiogenic shock: Secondary | ICD-10-CM

## 2023-07-13 DIAGNOSIS — R5381 Other malaise: Secondary | ICD-10-CM

## 2023-07-13 DIAGNOSIS — I50811 Acute right heart failure: Secondary | ICD-10-CM | POA: Diagnosis not present

## 2023-07-13 DIAGNOSIS — I442 Atrioventricular block, complete: Secondary | ICD-10-CM | POA: Diagnosis not present

## 2023-07-13 LAB — GLUCOSE, CAPILLARY
Glucose-Capillary: 108 mg/dL — ABNORMAL HIGH (ref 70–99)
Glucose-Capillary: 120 mg/dL — ABNORMAL HIGH (ref 70–99)
Glucose-Capillary: 130 mg/dL — ABNORMAL HIGH (ref 70–99)
Glucose-Capillary: 133 mg/dL — ABNORMAL HIGH (ref 70–99)
Glucose-Capillary: 136 mg/dL — ABNORMAL HIGH (ref 70–99)
Glucose-Capillary: 138 mg/dL — ABNORMAL HIGH (ref 70–99)
Glucose-Capillary: 139 mg/dL — ABNORMAL HIGH (ref 70–99)

## 2023-07-13 LAB — RENAL FUNCTION PANEL
Albumin: 2.5 g/dL — ABNORMAL LOW (ref 3.5–5.0)
Albumin: 2.6 g/dL — ABNORMAL LOW (ref 3.5–5.0)
Anion gap: 7 (ref 5–15)
Anion gap: 9 (ref 5–15)
BUN: 22 mg/dL — ABNORMAL HIGH (ref 6–20)
BUN: 21 mg/dL — ABNORMAL HIGH (ref 6–20)
CO2: 26 mmol/L (ref 22–32)
CO2: 23 mmol/L (ref 22–32)
Calcium: 10.5 mg/dL — ABNORMAL HIGH (ref 8.9–10.3)
Calcium: 10.8 mg/dL — ABNORMAL HIGH (ref 8.9–10.3)
Chloride: 100 mmol/L (ref 98–111)
Chloride: 99 mmol/L (ref 98–111)
Creatinine, Ser: 1.95 mg/dL — ABNORMAL HIGH (ref 0.61–1.24)
Creatinine, Ser: 1.64 mg/dL — ABNORMAL HIGH (ref 0.61–1.24)
GFR, Estimated: 41 mL/min — ABNORMAL LOW (ref >60)
GFR, Estimated: 51 mL/min — ABNORMAL LOW (ref >60)
Glucose, Bld: 152 mg/dL — ABNORMAL HIGH (ref 70–99)
Glucose, Bld: 141 mg/dL — ABNORMAL HIGH (ref 70–99)
Phosphorus: 2.7 mg/dL (ref 2.5–4.6)
Phosphorus: 2 mg/dL — ABNORMAL LOW (ref 2.5–4.6)
Potassium: 4.7 mmol/L (ref 3.5–5.1)
Potassium: 4.5 mmol/L (ref 3.5–5.1)
Sodium: 133 mmol/L — ABNORMAL LOW (ref 135–145)
Sodium: 131 mmol/L — ABNORMAL LOW (ref 135–145)

## 2023-07-13 LAB — MAGNESIUM: Magnesium: 2.6 mg/dL — ABNORMAL HIGH (ref 1.7–2.4)

## 2023-07-13 LAB — COOXEMETRY PANEL
Carboxyhemoglobin: 1.5 % (ref 0.5–1.5)
Methemoglobin: <0.7 % (ref 0.0–1.5)
O2 Saturation: 73.9 %
Total hemoglobin: 8.3 g/dL — ABNORMAL LOW (ref 12.0–16.0)

## 2023-07-13 MED ORDER — LORAZEPAM 2 MG/ML IJ SOLN
0.5000 mg | Freq: Once | INTRAMUSCULAR | Status: AC
  Administered 2023-07-13: 0.5 mg via INTRAVENOUS

## 2023-07-13 MED ORDER — LIDOCAINE 5 % EX PTCH
1.0000 | MEDICATED_PATCH | CUTANEOUS | Status: DC
  Administered 2023-07-13 – 2023-07-17 (×5): 1 via TRANSDERMAL
  Filled 2023-07-13 (×4): qty 1

## 2023-07-13 MED ORDER — PIVOT 1.5 CAL PO LIQD
1000.0000 mL | ORAL | Status: DC
  Administered 2023-07-13 – 2023-07-20 (×9): 1000 mL
  Filled 2023-07-13 (×2): qty 1000

## 2023-07-13 MED ORDER — MIDODRINE HCL 5 MG PO TABS
30.0000 mg | ORAL_TABLET | Freq: Three times a day (TID) | ORAL | Status: DC
  Administered 2023-07-13 – 2023-07-30 (×48): 30 mg
  Filled 2023-07-13 (×49): qty 6

## 2023-07-13 MED ORDER — INFLUENZA VIRUS VACC SPLIT PF (FLUZONE) 0.5 ML IM SUSY: 0.5000 mL | PREFILLED_SYRINGE | Freq: Once | INTRAMUSCULAR | Status: DC

## 2023-07-13 MED ORDER — LORAZEPAM 2 MG/ML IJ SOLN
INTRAMUSCULAR | Status: AC
  Filled 2023-07-13: qty 1

## 2023-07-13 NOTE — Progress Notes (Signed)
Telemetry/chart check note V paced, no bradycardia  Remains on:  Dobutamine > off Epi Nor-epi Vaso Midodrine > 20mg  TID  CVVHD  On Tube feeds  RHC yesterday noted Severe PAH   Pt asked palliative care no longer follow Remains full code  CHB persists 49-50 this AM Temp pacing wire  Threshold today remains unchanged <34mA Left at 8 and 82bpm  Still, not a candidate for EP procedures, PPM implant  Francis Dowse, PA-C

## 2023-07-13 NOTE — Progress Notes (Signed)
NAME:  [name], MRN:  [id number], DOB:  11-06-1971, LOS: 21 ADMISSION DATE:  06/22/2023, CONSULTATION DATE: 06/22/2023 REFERRING MD: Leafy Ro - TCTS, CHIEF COMPLAINT: Postcardiac surgery  History of Present Illness:  This is a 52 year old gentleman, past medical history of anxiety depression, bipolar disease, congestive heart failure chronic diastolic heart failure kidney disease followed by renal transplant in 07/16/98 repeat renal transplant in 07/16/14.  Patient underwent surgery today for severe mitral regurgitation severe tricuspid regurgitation and moderate aortic stenosis.  He also has atrial fibrillation received maze procedure.  He had a complex mitral valve repair a tricuspid valve annuloplasty and aortic valve replacement followed by full right and left maze procedure.  Pertinent Medical History:   Past Medical History:  Diagnosis Date   ADHD (attention deficit hyperactivity disorder)    Anemia    Anemia, chronic disease 07/13/2011   Anxiety    Arthritis    Bipolar 2 disorder (HCC) 08/18/2011   Diagnosed in July 16, 2010.    Bipolar disorder (HCC)    Blood dyscrasia    Nephrotic Anemia   Blood transfusion    Blood transfusion without reported diagnosis    Chronic diastolic CHF (congestive heart failure) (HCC)    Complication of anesthesia    Woke up intubated gets combative  afraid to be alone    Congestive heart failure (CHF) (HCC) 07/13/2011   Onset 07-16-2005.  Followed by Dr. Eden Emms.  S/p cardiac catheterization in [facility] Dr. Anne Fu..  S/p cardiac catheterization in 16-Jul-2013 by Nishan.  Echo Jul 16, 2013.    Depression    Dysrhythmia    A. Fib 07/17/23 with DCCV in May 2024   Erectile dysfunction    ESRD on hemodialysis (HCC) 07/13/2011   Glomerulonephritis at age 41, started HD in 07-17-95.  Deceased donor renal transplant 07-16-98 at [facility], then kidney failed and went back on HD in July 16, 2005.  L forearm AVF . s/p repeat renal transplant 10/2014 [facility].   GI bleed 11/01/2011   Rectal bleeding with  emesis/diarrhea    Heart murmur    Hemodialysis status (HCC)    HSV (herpes simplex virus) infection    Hydrocele, right 11/25/2014   Hypertension    Hypertensive emergency 01/06/2013   Hypoglycemia 01/11/2013   Influenza-like illness 12/18/2013   Mild ascending aorta dilatation (HCC)    NICM (nonischemic cardiomyopathy) (HCC)    Pericardial effusion    a. Mod by echo in 07-16-13, similar to prior.   Peripheral polyneuropathy 08/04/2016   Pneumonia    Pulmonary edema 01/06/2013   Pulmonary hypertension associated with end stage renal disease on dialysis [facility]) 07/13/2011   Renal transplant, status post 11/25/2014   Pt with Glomerulonephritis at age 31. Deceased donor renal transplant 07-16-1998 right and left renal transplant 10/2014.  Followed by [facility] transplant team; nephrologist is Dr. Lonna Cobb.    Respiratory failure with hypoxia (HCC) 01/06/2013   Shortness of breath 12/12/2012   Sleep apnea    Swollen testicle 07/13/2011   URI (upper respiratory infection) 01/25/2012    Significant Hospital Events: Including procedures, antibiotic start and stop dates in addition to other pertinent events   8/15 - AVR, MVR, TVR with TCTS ([place]). Remains on multiple pressors (NE, Epi, vaso). 8/16 - LIJ Trialysis catheter placed for CRRT.  8/17 extubated  8/18 - remains on CVVHD, 3 pressors  8/19-8/20 improving pressor needs 8/21 agitated delirium; cortrak 8/26 CRRT restarted 8/27 TVP placed, brady overnight and pacing turned to 80 8/30 palli consult  9/1 PRBC for low coox  9/2 coox down a bit again  9/3CRRT off overnight due to access issues. Remains on vaso 0.04, epi 10, norepi 2, DBA 5. 9/4 RHC, started iNO  Interim History / Subjective:  Feels improved today. No nausea. TF at goal.   Objective:  Blood pressure 105/71, pulse 81, temperature (!) 97.2 F (36.2 C), temperature source Axillary, resp. rate (!) 25, height 6\' 3"  (1.905 m), weight 81.1 kg, SpO2 100%. CVP:  [13 mmHg-19  mmHg] 18 mmHg  FiO2 (%):  [30 %-36 %] 36 %   Intake/Output Summary (Last 24 hours) at 07/13/2023 0724 Last data filed at 07/13/2023 0700 Gross per 24 hour  Intake 2639.58 ml  Output 3617.3 ml  Net -977.72 ml   Filed Weights   07/11/23 0500 07/12/23 0500 07/13/23 0451  Weight: 78.8 kg 82.8 kg 81.1 kg   Physical Examination:    General: chronically ill appearing man lying in bed in NAD HEENT: [place]/AT, eyes anicteric CV: paced rhythm, S1S2  Pulm: breathing comfortably on [place] with iNO, CTAB. Talking more today, but very weak voice. GI: soft, NT. Transplanted kidney in RLQ.   Derm: no rashes, warm & dry MSK: minimal muscle mass, RUE fistula Neuro: more awake today, more interactive, still not moving around much due to CRRT  BUN 22, Cr1.95 on CRRT Coox 74%  RHC 9/4:  Rest--  RV 92/24 PA 98/33 ( 55) Wedge 28 Fick CO 72 CI 3.4 PAPI 3 SVR 560  With occluded fistula PA 97/33 (55) Wedge 28 Fick CO  5.9 CI 2.8 PAPI 3 SVR 800    Resolved issues:   Possible PNA. Right pleural effusion - s/p pigtail chest tube with subsequent removal 8/25.   Assessment & Plan:   ICU delirium, acute encephalopathy  - improving.  -control hypercapnia-- better this week, con't bipap QHS -delirium precautions -OOB mobility has been limited with CRRT unfortunately -Continue reduced dose Seroquel and clonazaepam, continue lexapro  Acute respiratory failure with hypoxia & hypercapnia -BiPAP PRN -iNO -supplemental O2 to maintain SpO2 >90% -pulmonary hygiene -volume management with CRRT; unfortunately getting lots of volume with inotropes  MVR, TVR, AS s/p bioprosthetic replacement CHB/ post op junctional rhythm s/p TVP (8/27) Cardiogenic shock, RV failure, high output heart failure Enlarging pericardial effusion w/o tamponade AoC HFrEF> currently EF recovered Afib s/p MAZE  -post op distributive/vasoplegia, improved. Possible sepsis now off abx. Has RV failure. Pericardial effusion  without tamponade physiology P -appreciate TCTS management -planning for repeat RHC after iNO ; not on PDE5i -Appreciate EP-- so far is PPM dependent -midodrine -goal SBP >95; wean vaso and NE off. Con't epi now for RV support.    ESRD; CKD s/p failed renal transplants x 2 (1999, 2015)  Hypophosphatemia Hyponatremia  Hypokalemia  Hypercalcemia  Hypermagnesemia  History of kidney disease status post renal transplant (failed 1999 and 2015). P -CRRT per nephrology -strict I/O -monitor electrolytes and replete as needed  H/o HSV -con't PTA acyclovir   Anemia - expected ABLA (resolved), critical illness, chronic dz  -transfuse for Jb <7 or hemodynamically significant bleeding -aranesp per nephrology  Moderate malnutrition  -con't TF -ad lib oral intake once he passes swallow study -con't reglan TID  Possible gout flare -con't colchicine  Physical deconditioning  -needs PT,OT, SLP.Anticipate discharge will need CIR.   Anxiety attack this afternoon- due to accidental disconnection of iNO -improved when back on iNO -low dose ativan  GOC -Family had been updated 9/3. Full aggressive care desired. He was udpated at bedside on  plan today   Best Practice (right click and "Reselect all SmartList Selections" daily)   Diet/type:TF  DVT prophylaxis:heparin ppx GI prophylaxis: PPI Lines: Central line, Dialysis Catheter, and Arterial Line Foley:  No  Code Status:  full code Last date of multidisciplinary goals of care discussion [Per Primary Team]  This patient is critically ill with multiple organ system failure which requires frequent high complexity decision making, assessment, support, evaluation, and titration of therapies. This was completed through the application of advanced monitoring technologies and extensive interpretation of multiple databases. During this encounter critical care time was devoted to patient care services described in this note for 37  minutes.  Steffanie Dunn, DO 07/13/23 4:58 PM [facility]  For contact information, see Amion. If no response to pager, please call PCCM consult pager. After hours, 7PM- 7AM, please call Elink.

## 2023-07-13 NOTE — Progress Notes (Signed)
[facility] Progress Note  Subjective: I/O net negative 810 cc yesterday.   Vitals:   07/13/23 1045 07/13/23 1100 07/13/23 1115 07/13/23 1130  BP:      Pulse: 79 80 80 80  Resp: (!) 28  (!) 28 (!) 33  Temp:      TempSrc:      SpO2: 99% 99% 99% 100%  Weight:      Height:        Physical Exam:       General adult male in bed on bipap Lungs clear but reduced to auscultation bilaterally  Heart S1S2 Abdomen soft nontender nondistended Extremities no edema appreciated Neuro - resting and awakens more easily; Oriented to person and year. Provides some history and answers some questions   Access RUE AVF with bruit and thrill; left internal jugular nontunneled dialysis catheter    OP HD: [facility] triad MWF nocturnal 3h    F250     94kg   2K/2.5 Ca bath   RUE AVF    Summary: [name] is a/an 52 y.o. male with a past medical history notable for ESRD on HD admitted with severe MR and TR and aortic stenosis s/p repair/replacement.    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  Per cardiothoracic surgery  # Complete heart block - post-op, per EP/ cardiology   # Possible high-output heart failure - low RV function, high CO-OX at 86%, echo EF 70-75%. Going for RHC cath today.   # ESRD - pt was too hypotensive for hemodialysis post-op.  CRRT started 8/16 and remains on CRRT.   # HD access - temp cath clotted overnight. TPA worked and temp cath is back working again   [employer] shock - remains on 3 pressors + po midodrine 20 tid. Off dobutamine.   # pHTN - severe by RHC. Per CHF team.   # Volume - improved, wt's down significantly. Keeping even, except pulling net neg 50 cc/hr if CVP > 15 (per CHF team).   # Pericardial effusion - per CT surgery and CHF.  We are optimizing volume status with CRRT as above.  Assessed as moderate on last TTE    # Anemia of ESRD - transfuse as needed per primary team. Concurrent thrombocytopenia improved.  Aranesp increased to 150 mcg weekly on Tuesdays    # MBD ckd -  Phos acceptable.  No binders currently as on CRRT - normally on renvela.  Ordering PTH. See below for Ca++ issues.   # Hypercalcemia - remains mild w/ corr Ca++ 11.7 today. Suspect due to immobility. Sensipar can't be given by NG tube. Usually mild-mod hypercalcemia does not require Rx unless symptomatic. Follow.    # Vascular access - RUE AVF had been functioning well but have been unable to use due to shock and CRRT requirement.  Temporary dialysis catheter in place for CRRT.  Mark Moselle  MD  [facility] 07/13/2023, 12:00 PM  Recent Labs  Lab 07/12/23 1434 07/12/23 1517 07/12/23 1649 07/13/23 0411  HGB 8.8* 9.2*  9.5*  --   --   ALBUMIN  --   --  2.5* 2.5*  CALCIUM  --   --  11.1* 10.5*  PHOS  --   --  3.5 2.7  CREATININE  --   --  2.32* 1.95*  K 4.5 4.4  4.8 4.6 4.7    Inpatient medications:  acyclovir  200 mg Per Tube BID   aspirin  81 mg Per Tube Daily   atorvastatin  10 mg Per Tube QHS   azelastine  2 spray Each Nare BID   Chlorhexidine Gluconate Cloth  6 each Topical Daily   cinacalcet  30 mg Oral Q supper   colchicine  0.3 mg Per Tube Once every 5 days   darbepoetin (ARANESP) injection - DIALYSIS  150 mcg Subcutaneous Q Tue-1800   docusate  100 mg Per Tube BID   escitalopram  10 mg Per Tube Daily   folic acid  1 mg Per Tube Daily   guaiFENesin  15 mL Per Tube BID   heparin injection (subcutaneous)  5,000 Units Subcutaneous Q8H   [START ON 07/20/2023] influenza vac split trivalent PF  0.5 mL Intramuscular Once   insulin aspart  0-24 Units Subcutaneous Q4H   lidocaine  1 patch Transdermal Q24H   lidocaine-prilocaine  1 Application Topical Q M,W,F   metoCLOPramide  5 mg Per Tube TID   midodrine  30 mg Per Tube TID   multivitamin  1 tablet Per Tube BID   mouth rinse  15 mL Mouth Rinse 4 times per day   pantoprazole  (PROTONIX) IV  40 mg Intravenous Q12H   polyethylene glycol  17 g Per Tube Daily   QUEtiapine  12.5 mg Per Tube QHS   sodium chloride flush  3 mL Intravenous Q12H   thiamine  100 mg Oral Daily     prismasol BGK 4/2.5 400 mL/hr at 07/13/23 0314    prismasol BGK 4/2.5 400 mL/hr at 07/13/23 0806   sodium chloride 10 mL/hr at 07/13/23 1100   sodium chloride 10 mL/hr at 07/13/23 1100   albumin human Stopped (06/23/23 0040)   epinephrine 10 mcg/min (07/13/23 1100)   feeding supplement (PIVOT 1.5 CAL) 70 mL/hr at 07/13/23 1100   norepinephrine (LEVOPHED) Adult infusion Stopped (07/13/23 0720)   prismasol BGK 4/2.5 1,800 mL/hr at 07/13/23 1025   vasopressin 0.04 Units/min (07/13/23 1100)   sodium chloride, sodium chloride, acetaminophen, albumin human, bisacodyl **OR** bisacodyl, clonazepam, heparin, ondansetron (ZOFRAN) IV, mouth rinse, oxyCODONE, phenol, simethicone, sodium chloride flush

## 2023-07-13 NOTE — Progress Notes (Signed)
[address]       [place] [address]             [phone]      1 Day Post-Op  Procedure(s) (LRB): RIGHT HEART CATH (N/A)   Total Length of Stay:  LOS: 21 days    SUBJECTIVE: Feels ok, would like to eat  Vitals:   07/13/23 0600 07/13/23 0700  BP:    Pulse: 80 81  Resp: (!) 27 (!) 25  Temp:    SpO2: 99% 100%    Intake/Output      09/04 0701 09/05 0700 09/05 0701 09/06 0700   I.V. (mL/kg) 1370.3 (16.9)    NG/GT 1269.3    Total Intake(mL/kg) 2639.6 (32.5)    Stool 280    CRRT 3337.3    Total Output 3617.3    Net -977.7              prismasol BGK 4/2.5 400 mL/hr at 07/13/23 0314    prismasol BGK 4/2.5 400 mL/hr at 07/12/23 0302   sodium chloride 10 mL/hr at 07/13/23 0700   sodium chloride 10 mL/hr at 07/13/23 0700   albumin human Stopped (06/23/23 0040)   epinephrine 10 mcg/min (07/13/23 0700)   feeding supplement (PIVOT 1.5 CAL) 65 mL/hr at 07/13/23 0700   norepinephrine (LEVOPHED) Adult infusion 1 mcg/min (07/13/23 0700)   prismasol BGK 4/2.5 1,800 mL/hr at 07/13/23 0449   vasopressin 0.04 Units/min (07/13/23 0700)    CBC    Component Value Date/Time   WBC 5.5 07/11/2023 0334   RBC 2.44 (L) 07/11/2023 0334   HGB 9.5 (L) 07/12/2023 1517   HGB 9.2 (L) 07/12/2023 1517   HCT 28.0 (L) 07/12/2023 1517   HCT 27.0 (L) 07/12/2023 1517   PLT 151 07/11/2023 0334   MCV 107.8 (H) 07/11/2023 0334   MCH 32.0 07/11/2023 0334   MCHC 29.7 (L) 07/11/2023 0334   RDW 21.5 (H) 07/11/2023 0334   LYMPHSABS 2.2 03/15/2020 0947   MONOABS 0.5 03/15/2020 0947   EOSABS 0.2 03/15/2020 0947   BASOSABS 0.0 03/15/2020 0947   CMP     Component Value Date/Time   NA 133 (L) 07/13/2023 0411   K 4.7 07/13/2023 0411   CL 100 07/13/2023 0411   CO2 26 07/13/2023 0411   GLUCOSE 152 (H) 07/13/2023 0411   BUN 22 (H) 07/13/2023 0411   CREATININE 1.95 (H) 07/13/2023 0411   CALCIUM 10.5 (H) 07/13/2023 0411   CALCIUM 9.8 02/18/2011 1007   PROT 8.1  07/05/2023 0452   ALBUMIN 2.5 (L) 07/13/2023 0411   AST 46 (H) 07/05/2023 0452   ALT 13 07/05/2023 0452   ALKPHOS 78 07/05/2023 0452   BILITOT 1.0 07/05/2023 0452   GFRNONAA 41 (L) 07/13/2023 0411   GFRAA 10 (L) 03/16/2020 0825   ABG    Component Value Date/Time   PHART 7.317 (L) 07/11/2023 1346   PCO2ART 48.2 (H) 07/11/2023 1346   PO2ART 103 07/11/2023 1346   HCO3 25.9 07/12/2023 1517   HCO3 24.6 07/12/2023 1517   TCO2 27 07/12/2023 1517   TCO2 26 07/12/2023 1517   ACIDBASEDEF 1.0 07/12/2023 1517   O2SAT 73.9 07/13/2023 0411   CBG (last 3)  Recent Labs    07/12/23 1946 07/12/23 2335 07/13/23 0331  GLUCAP 135* 107* 108*  EXAM Lungs: decreased Card: RR Ext: Warm Neuro: intact   ASSESSMENT: SP AVR, MV/TV repair Maze Now with severe PHTN on nasal NO. Has had some reduction  in inotropic needs. Would still like to have fistula removed to optimize reduction in PHTN with poor RV function. Will discuss with AHF  team Negative on continuous CRRT.  To have swallow assessment Continue tube feeds OOB to chair today Will start Coumadin and cut EPWs when pt gets PPM    Mark Hoes, MD 07/13/2023

## 2023-07-13 NOTE — Progress Notes (Signed)
Speech Language Pathology Treatment: Dysphagia  Patient Details Name: [name] MRN: [id number] DOB: Feb 01, 1971 Today's Date: 07/13/2023 Time: 1045-1100 SLP Time Calculation (min) (ACUTE ONLY): 15 min  Assessment / Plan / Recommendation Clinical Impression  Pt fully alert, more participatory today than in any prior session. Pt is quite dysphonic and cough is weak. He enjoys ice but has multiple swallows and throat clearing with sips of water. Given fragility, recommend repeat instrumental test prior to resuming a diet. Pt is going to have a procedure tomorrow that he will need to be NPO for. Recommend allowing ice whenever alert but SLP will /fu next week for further diet advancement.   HPI HPI: This is a 52 year old gentleman, Patient underwent surgery 8/15  for severe mitral regurgitation severe tricuspid regurgitation and moderate aortic stenosis.  He also has atrial fibrillation received maze procedure.  He had a complex mitral valve repair a tricuspid valve annuloplasty and aortic valve replacement followed by full right and left maze procedure. Extubated 8/17. past medical history of tracheostomy in 2011 (reason unknown, pt reports "real bad pna"), anxiety depression, bipolar disease, congestive heart failure chronic diastolic heart failure kidney disease followed by renal transplant in 1999 repeat renal transplant in 2015.      SLP Plan  Continue with current plan of care      Recommendations for follow up therapy are one component of a multi-disciplinary discharge planning process, led by the attending physician.  Recommendations may be updated based on patient status, additional functional criteria and insurance authorization.    Recommendations  Diet recommendations: NPO (ice chips)                  Oral care BID           Continue with current plan of care     Davonna Ertl, Riley Nearing  07/13/2023, 12:01 PM

## 2023-07-13 NOTE — Progress Notes (Addendum)
Advanced Heart Failure Rounding Note  PCP-Cardiologist: Reatha Harps, MD   Subjective:   8/15: Elective triple vale surgery + MAZE. Underwent aortic valve replacement + mitral and tricuspid valve repairs by Dr. Milinda Antis 8/22: AHF consulted for high-output HF with persistent post-op shock 8/25: CRRT stopped 8/26: CRRT restarted  08/27: S/p TTVP Echo: EF 70-75%, severe LVH, RV mod reduced, RV severely enlarged. LA/RA mod dilated. Large pericardial effusion. No evidence of tamponade.   Currently on 10 Epi, off NE, + 0.04 Vaso + 2.5 DBA. Midodrine 20 tid CO-OX 74% (poss sepsis + AV fistula).  Remains in CVVHD. Pulling ~50/hr  Remains on TFs.   RHC as below.   Resting comfortably in bed. Occluded AVF at bedside this am by MD, SBP increased by 20 points.   Objective:   Findings: Resting numbers on multiple pressors Ao = 122/54 (72) RA = 22 RV = 92/24 PA = 98/33 (55) PCW = 28 (v to 37) Fick cardiac output/index = 6.9/3.3 Thermo CO/CI = 7.2/3.4 PVR = 4.2 (Fick) Thermo (3.8) SVR = 560 FA sat = 96% PA sat = 56% PAPI = 3   With AVF occluded Ao = 132/64 (82) RA = 22 PA = 97/33 (55) PCW = 28 (v to 35) Fick cardiac output/index = 5.9/2.8 Thermo CO/CI = 6.0/2.8 PVR = 4.6 (Fick) 4.5 (Thermo) SVR = 800 FA sat = 98% PA sat = 53% PAPI = 3  Assessment: - Complicated hemodynamics. There is severe pulmonary HTN with preserved PAPI. PAH seems to be multifactorial due to high out, increased PVR and elevated left-sided pressures.  - The MAP increased 10 points with occlusion of AVF with increase in SVR and decrease in CO.  - Will attempt to address pulmonary pressures with iNO and low-dose sildenafil. If no benefit will consider ligation of AVF to help facilitate liberation from pressor support.   Weight Range: 81.1 kg Body mass index is 22.35 kg/m.   Vital Signs:   Temp:  [96.4 F (35.8 C)-98.6 F (37 C)] 97.7 F (36.5 C) (09/05 0800) Pulse Rate:  [0-81] 80 (09/05  0845) Resp:  [5-33] 32 (09/05 0845) BP: (102-105)/(69-71) 105/71 (09/04 1413) SpO2:  [91 %-100 %] 100 % (09/05 0845) Arterial Line BP: (91-116)/(41-56) 110/50 (09/05 0845) FiO2 (%):  [30 %-36 %] 36 % (09/04 1626) Weight:  [81.1 kg] 81.1 kg (09/05 0451) Last BM Date : 07/13/23  Weight change: Filed Weights   07/11/23 0500 07/12/23 0500 07/13/23 0451  Weight: 78.8 kg 82.8 kg 81.1 kg   Intake/Output:   Intake/Output Summary (Last 24 hours) at 07/13/2023 0929 Last data filed at 07/13/2023 0800 Gross per 24 hour  Intake 2577.35 ml  Output 3529.3 ml  Net -951.95 ml    Physical Exam  General:  weak appearing.  No respiratory difficulty HEENT: normal. +cortak Neck: supple. JVD elevated. Carotids 2+ bilat; no bruits. No lymphadenopathy or thyromegaly appreciated. [facility] CVC Cor: PMI nondisplaced. Regular rate & rhythm. No rubs, gallops or murmurs. Lungs: clear Abdomen: soft, nontender, nondistended. No hepatosplenomegaly. No bruits or masses. Good bowel sounds. Extremities: no cyanosis, clubbing, rash, edema. Fistula RUE +bruit/thrill. L brachial a line.  GU: + flexi Neuro: alert & oriented x 3, cranial nerves grossly intact. moves all 4 extremities w/o difficulty. Affect pleasant.  Telemetry   V paced 80s. Personally reviewed  Labs    CBC Recent Labs    07/11/23 0334 07/11/23 0803 07/12/23 1434 07/12/23 1517  WBC 5.5  --   --   --  HGB 7.8*   < > 8.8* 9.2*  9.5*  HCT 26.3*   < > 26.0* 27.0*  28.0*  MCV 107.8*  --   --   --   PLT 151  --   --   --    < > = values in this interval not displayed.   Basic Metabolic Panel Recent Labs    82/95/62 0422 07/12/23 1425 07/12/23 1649 07/13/23 0411  NA 132*   < > 134* 133*  K 4.3   < > 4.6 4.7  CL 100  --  102 100  CO2 26  --  22 26  GLUCOSE 109*  --  101* 152*  BUN 28*  --  26* 22*  CREATININE 2.22*  --  2.32* 1.95*  CALCIUM 10.5*  --  11.1* 10.5*  MG 2.6*  --   --  2.6*  PHOS 3.1  --  3.5 2.7   < > = values in this  interval not displayed.   Liver Function Tests Recent Labs    07/12/23 1649 07/13/23 0411  ALBUMIN 2.5* 2.5*   No results for input(s): "LIPASE", "AMYLASE" in the last 72 hours. Cardiac Enzymes No results for input(s): "CKTOTAL", "CKMB", "CKMBINDEX", "TROPONINI" in the last 72 hours.  BNP: BNP (last 3 results) No results for input(s): "BNP" in the last 8760 hours.  ProBNP (last 3 results) No results for input(s): "PROBNP" in the last 8760 hours.  D-Dimer No results for input(s): "DDIMER" in the last 72 hours. Hemoglobin A1C No results for input(s): "HGBA1C" in the last 72 hours. Fasting Lipid Panel No results for input(s): "CHOL", "HDL", "LDLCALC", "TRIG", "CHOLHDL", "LDLDIRECT" in the last 72 hours. Thyroid Function Tests No results for input(s): "TSH", "T4TOTAL", "T3FREE", "THYROIDAB" in the last 72 hours.  Invalid input(s): "FREET3"  Other results:  Imaging   CARDIAC CATHETERIZATION  Result Date: 07/12/2023 Findings: Resting numbers on multiple pressors Ao = 122/54 (72) RA = 22 RV = 92/24 PA = 98/33 (55) PCW = 28 (v to 37) Fick cardiac output/index = 6.9/3.3 Thermo CO/CI = 7.2/3.4 PVR = 4.2 (Fick) Thermo (3.8) SVR = 560 FA sat = 96% PA sat = 56% PAPI = 3 With AVF occluded Ao = 132/64 (82) RA = 22 PA = 97/33 (55) PCW = 28 (v to 35) Fick cardiac output/index = 5.9/2.8 Thermo CO/CI = 6.0/2.8 PVR = 4.6 (Fick) 4.5 (Thermo) SVR = 800 FA sat = 98% PA sat = 53% PAPI = 3 Assessment: Complicated hemodynamics. There is severe pulmonary HTN with preserved PAPI. PAH seems to be multifactorial due to high out, increased PVR and elevated left-sided pressures. The MAP increased 10 points with occlusion of AVF with increase in SVR and decrease in CO. Will attempt to address pulmonary pressures with iNO and low-dose sildenafil. If no benefit will consider ligation of AVF to help facilitate liberation from pressor support. Mark Meres, MD 5:46 PM   Medications:     Scheduled  Medications:  acyclovir  200 mg Per Tube BID   aspirin  81 mg Per Tube Daily   atorvastatin  10 mg Per Tube QHS   azelastine  2 spray Each Nare BID   Chlorhexidine Gluconate Cloth  6 each Topical Daily   cinacalcet  30 mg Oral Q supper   colchicine  0.3 mg Per Tube Once every 5 days   darbepoetin (ARANESP) injection - DIALYSIS  150 mcg Subcutaneous Q Tue-1800   docusate  100 mg Per Tube BID  escitalopram  10 mg Per Tube Daily   folic acid  1 mg Per Tube Daily   guaiFENesin  15 mL Per Tube BID   heparin injection (subcutaneous)  5,000 Units Subcutaneous Q8H   insulin aspart  0-24 Units Subcutaneous Q4H   lidocaine  1 patch Transdermal Q24H   lidocaine-prilocaine  1 Application Topical Q M,W,F   metoCLOPramide  5 mg Per Tube TID   midodrine  20 mg Per Tube TID   multivitamin  1 tablet Per Tube BID   mouth rinse  15 mL Mouth Rinse 4 times per day   mouth rinse  15 mL Mouth Rinse 4 times per day   pantoprazole (PROTONIX) IV  40 mg Intravenous Q12H   polyethylene glycol  17 g Per Tube Daily   QUEtiapine  12.5 mg Per Tube QHS   sodium chloride flush  3 mL Intravenous Q12H   thiamine  100 mg Oral Daily    Infusions:   prismasol BGK 4/2.5 400 mL/hr at 07/13/23 0314    prismasol BGK 4/2.5 400 mL/hr at 07/13/23 0806   sodium chloride 10 mL/hr at 07/13/23 0800   sodium chloride 10 mL/hr at 07/13/23 0800   albumin human Stopped (06/23/23 0040)   epinephrine 10 mcg/min (07/13/23 0800)   feeding supplement (PIVOT 1.5 CAL) 70 mL/hr at 07/13/23 0847   norepinephrine (LEVOPHED) Adult infusion Stopped (07/13/23 0720)   prismasol BGK 4/2.5 1,800 mL/hr at 07/13/23 0734   vasopressin 0.04 Units/min (07/13/23 0800)    PRN Medications: sodium chloride, sodium chloride, acetaminophen, albumin human, bisacodyl **OR** bisacodyl, clonazepam, heparin, ondansetron (ZOFRAN) IV, mouth rinse, mouth rinse, oxyCODONE, phenol, simethicone, sodium chloride flush  Patient Profile   52 y/o AAM w/ mildly  reduced systolic heart failure (EF once 45-50% in 2013 but normal on subsequent echos) valvular heart disease including severe AS, 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 EF 55-60% w/ mild RV dysfunction by echo in 6/24.    Admitted for elective triple vale surgery + MAZE. S/p bioprosthetic aortic valve replacement + mitral and tricuspid valve repairs. Post-op course c/b persistent vasoplagic syndrome.   Assessment/Plan  1. Post-op Vasopalegia  - Echo 06/29/23 EF 60-65%, no RWMA, small pericardial effusion present, MV repaired/replaced, trivial MR. TR repaired/replaced, mild TR. AV repaired/replaced.  - Echo 07/04/23: EF 70-75%, severe concentric LVH, RV mod reduced, RV severely enlarged. LA/RA mod dilated. Large pericardial effusion. No evidence of tamponade - Echo 08/29: Moderate pericardial effusion, no tamponade - Suspected high output HF 2/2 AV fistula  - POD #20 remains dependent on high-dose pressors - PCT elevated but in setting of ESRD. Completed Zosyn for possible septic component - CVP 22 Targeting CVP 12-13 d/t severe RV failure. Continue to pull ~50/hr on CRRT - RHC as above, SBP and SVR increased with AVF occlusion. Concerned about permanently occluding with multiple valve repairs and high risk for infection. Will continue to try to medically manage before proceeding with permanent occlusion.  - Now off NE with increased midodrine dose yesterday - Not making much meaningful progress. Need to try to wean pressors. If unable to wean then need to consider comfort care.    2.  Acute on Chronic Systolic Heart Failure w/ Prominent RV Dysfunction  - LVEF once 45-50% in 2013 but normal on subsequent echos. Pre-op TTE 6/25 EF 55-60%, GIIIDD (Restrictive) RV mildly reduced,  mod PH w/ estimated RVSP 48 mmHg  - Intra-op TEE (while on 4 mcg of Epi) LVEF  45%, RV mod-severely reduced, stable AV prosthesis w/ normal gradient and stable valvuloplasty rings w/ mod residual MS, mean gradient 7 mmgH.   - echo 07/04/23: EF 70-75%, severe concentric LVH, RV mod reduced, RV severely enlarged. LA/RA mod dilated. Large pericardial effusion. No evidence of tamponade.  - Echo 08/29: EF 65-70%, RV moderately reduced, pericardial effusion slightly smaller, no tamponade - Increase Midodrine 20>30 mg TID - Inotropes/pressors and volume management as above. Continue to wean as toelrated  3. 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 Post-op CHB  - EP following. - S/p RIJ temp wire 08/27 - EPW left in place d/t concern for risk of tamponade - V paced 80. Underneath sinus with variable PR 50-60s. BP optimized with VPacing at 85-90   5. 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 Nephrology following. Continue pulling until CVP 15 - Plan as above   6. H/o Atrial Fibrillation  - S/p MAZE    7. Thrombocytopenia/ Anemia - Thrombocytopenia resolved - Hgb 9.5 today  8. ID: ?sepsis component  - Completed Zosyn per CCM.  - PCT elevated in setting of ESRD  9. Large pericardial effusion - seen on echo 07/04/23. Previously small on echo 06/29/23 - no evidence of tamponade - Limited echo 08/29 stable  10. GOC - palliative care initially following. Now signed off 9/2 per patient request - remains full code - remains tenuous with pressor requirements. Plans discussed with parents at bedside 9/3  Length of Stay: 21  Alen Bleacher, NP  07/13/2023, 9:29 AM  Advanced Heart Failure Team Pager [phone] (M-F; 7a - 5p)  Please contact [facility] for night-coverage after hours (5p -7a ) and weekends on [email or web address]  Agree with  above.  RHC with severe PAH with normal output and low SVR. PVR 4-5.   Now on iNO, epi 10, VP 0.04 and midodrine. Off NE.   Feels ok. Denies CP or SPB. CVP 22. Pulling -50 on CVVHD  General:  Weak appearing. No resp difficulty HEENT: normal Neck: supple. RIJ TVP  + HD cath Carotids 2+ bilat; no bruits. No lymphadenopathy or thryomegaly appreciated. Cor: Regular rate & rhythm. No rubs, gallops or murmurs. Lungs: clear Abdomen: soft, nontender, nondistended. No hepatosplenomegaly. No bruits or masses. Good bowel sounds. Extremities: no cyanosis, clubbing, rash,1+ edema + RUE AVF Neuro: alert & orientedx3, cranial nerves grossly intact. moves all 4 extremities w/o difficulty. Affect pleasant  Difficult case. Although he has severe PAH on RHC. His PAPi and cardiac output were normal with inotorpe support. I suspect main contributor to hypotension is low SVR. SBP improves nearly 20 points with AVF occlusion.   I think AVF occlusion will help him but then he would require perm cath for HD and in setting of multi-valve repair/replacement risk of infection would be very high. Will continue to try and wean pressors. If unsuccessful will ask VVS to proceed with AVG ligation.   D/w CCM  CRITICAL CARE Performed by: [name]  Total critical care time: 50 minutes  Critical care time was exclusive of separately billable procedures and treating other patients.  Critical care was  necessary to treat or prevent imminent or life-threatening deterioration.  Critical care was time spent personally by me (independent of midlevel providers or residents) on the following activities: development of treatment plan with patient and/or surrogate as well as nursing, discussions with consultants, evaluation of patient's response to treatment, examination of patient, obtaining history from patient or surrogate, ordering and performing treatments and interventions, ordering and review of laboratory studies,  ordering and review of radiographic studies, pulse oximetry and re-evaluation of patient's condition.   Mark Meres, MD  5:33 PM

## 2023-07-13 NOTE — Procedures (Signed)
Arterial Catheter Insertion Procedure Note  Dohnovan Clarida  [id number]  1971-11-04  Date:07/13/23  Time:4:27 PM    Provider Performing: Lidia Collum    Procedure: Insertion of Arterial Line (65784) with US guidance (69629)     Indication(s) Blood pressure monitoring and/or need for frequent ABGs  Consent Risks of the procedure as well as the alternatives and risks of each were explained to the patient and/or caregiver.  Consent for the procedure was obtained and is signed in the bedside chart  Anesthesia None   Time Out Verified patient identification, verified procedure, site/side was marked, verified correct patient position, special equipment/implants available, medications/allergies/relevant history reviewed, required imaging and test results available.   Sterile Technique Maximal sterile technique including full sterile barrier drape, hand hygiene, sterile gown, sterile gloves, mask, hair covering, sterile ultrasound probe cover (if used).   Procedure Description Area of catheter insertion was cleaned with chlorhexidine and draped in sterile fashion. With real-time ultrasound guidance an arterial catheter was placed into the right femoral artery.  Appropriate arterial tracings confirmed on monitor.     Complications/Tolerance None; patient tolerated the procedure well.   EBL Minimal   Specimen(s) None  JD Anselm Lis [facility] 07/13/2023, 4:28 PM  Please see [email or web address] for pager details.  From 7A-7P if no response, please call [phone]. After hours, please call ELink [phone].

## 2023-07-13 NOTE — Progress Notes (Signed)
   07/13/23 2059  BiPAP/CPAP/SIPAP  BiPAP/CPAP/SIPAP Pt Type Adult  BiPAP/CPAP/SIPAP V60  Reason BIPAP/CPAP not in use Non-compliant  Mask Type Full face mask  Mask Size Large

## 2023-07-13 NOTE — Progress Notes (Signed)
      Dr. Gala Romney post cardiac cath:  Will attempt to address pulmonary pressures with iNO and low-dose sildenafil. If no benefit will consider ligation of AVF to help facilitate liberation from pressor support.    VVS will be available for fistula ligation if needed  Mosetta Pigeon PA-C

## 2023-07-13 NOTE — Progress Notes (Signed)
Nutrition Follow-up  DOCUMENTATION CODES:   Severe malnutrition in context of chronic illness  INTERVENTION:   Continue tube feeding via Cortrak (gastric): - Increase Pivot 1.5 to new goal rate of 70 ml/hr (1680 ml/day)  Tube feeding regimen provides 2520 kcal, 158 grams of protein, and 1260 ml of H2O.  - Continue renal MVI BID per tube  - Continue thiamine 100 mg daily  - Continue folic acid 1 mg daily  - RD will monitor for diet advancement after SLP evaluation and add oral nutrition supplements as appropriate; recommend continuing 24-hour tube feeds at goal rate even if diet advanced due to severity of malnutrition and elevated nutrition needs  NUTRITION DIAGNOSIS:   Severe Malnutrition related to chronic illness as evidenced by severe muscle depletion, severe fat depletion.  Ongoing, being addressed via enteral nutrition  GOAL:   Patient will meet greater than or equal to 90% of their needs  Met via enteral nutrition  MONITOR:   PO intake, TF tolerance, Labs, Weight trends  REASON FOR ASSESSMENT:   Consult Assessment of nutrition requirement/status  ASSESSMENT:   52 y/o male with h/o glomerulonephritis (age 83) s/p renal transplant (1999) and right and left renal transplants (2015), ESRD on HD (>20 yrs), HTN, bipolar 2 disorder, PTSD, ADHD, anxiety, depression, GERD, OSA, NICM, thrombocytopenia, pulmonary hypertension, afib s/p DCCV (03/2019), CHF, severe mitral and tricuspid valve regurgitation and moderate aortic stenosis s/p mitral valve repair, tricuspid valve annuloplasty, aortic valve replacement and full right and left maze procedure 8/16.  8/15 - AVR, MVR, TVR by Dr. Leafy Ro 8/16 - CRRT initiated 8/17 - extubated, clear liquid diet 8/19 - chest tube inserted for effusion, BiPAP overnight 8/20 - no BM, abd x-ray with nonobstructive bowel gas pattern 8/21 - Cortrak placed, TF initiated 8/22 - NPO due to mental status, TF continued 8/25 - CRRT discontinued   8/26 - CRRT restarted 8/28 - TF on hold 8/29 - TF remains on hold 8/30 - trickle TF resumed per MD, TPN recommended 8/31 - TF rate increased to 30 ml/hr 9/02 - TF rate increased to 35 ml/hr 9/03 - CRRT temporarily off due to access issues, TF rate increased to 40 ml/hr, supplemental TPN recommended, CRRT resumed 9/04 - s/p RHC with occlusion of fistula  Pt remains NPO with Cortrak in place. Pivot 1.5 tube feeds infusing at goal rate of 65 ml/hr. Pt refusing PROSource due to nausea so this was discontinued. CRRT currently running. Pt has been off dobutamine since yesterday morning. Levophed off this AM. RN reports plan is now to wean vasopressin. SLP to see pt today for potential diet advancement.  Discussed with RN and pt that 24-hour tube feeds at goal rate need to be continued even if diet advanced due to severity of malnutrition and elevated nutrition needs. Pt and RN expressed understanding. Also discussed with Dr. Chestine Spore.  Pt reports only occasional nausea when meds are being administered per tube. This is why he has been refusing PROSource TF. Tube feeding regimen adjusted to meet pt's estimated needs without PROSource TF.  Weight down almost 14 kg this admission. However, pt with non-pitting generalized edema and non-pitting edema to BLE. Unsure of true dry weight at this time. Lowest weight this admission was 76.8 kg on 8/31.  Admit weight: 95 kg Current weight: 81.1 kg  BP (a-line): 155/52 MAP (a-line): 70  Drips: Levophed: off this AM Epinephrine: 10 mcg/min Vasopressin: 0.04 units/min  Medications reviewed and include: sensipar, aranesp, colace, folic acid 1 mg daily,  SSI every 4 hours, reglan 5 mg TID, rena-vit, IV protonix, miralax, thiamine 100 mg daily  Micronutrient Labs: CRP: 7.9 (H) Ceruloplasmin 36.5 (H) Copper: 131 (WNL) Folate, serum: 3.6 (L) 06/26/23 Iron, serum: 121 (WNL) Thiamine: 191.3 (WNL) Vitamin B12: 1175 (H) 06/26/23 Vitamin B6: 21.6 (WNL) Vitamin  C: 0.7 (WNL) Zinc: 95 (WNL)  Labs reviewed: sodium 133, BUN 22, creatinine 1.95, ionized calcium 1.49, magnesium 2.6 CBG's: 107-139 x 24 hours  CRRT UF: 3337 ml x 24 hours FMS: 280 ml x 24 hours I/O's: -5.2 L since admit  Diet Order:   Diet Order             Diet NPO time specified Except for: Ice Chips  Diet effective now                   EDUCATION NEEDS:   Not appropriate for education at this time  Skin:  Skin Assessment: Skin Integrity Issues: Incisions: sternal, L thigh  Last BM:  08/01/23 FMS with 280 ml output x 24 hours  Height:   Ht Readings from Last 1 Encounters:  07/04/23 6\' 3"  (1.905 m)    Weight:   Wt Readings from Last 1 Encounters:  07/13/23 81.1 kg    Ideal Body Weight:  89 kg  BMI:  Body mass index is 22.35 kg/m.  Estimated Nutritional Needs:   Kcal:  2400-2600 kcals  Protein:  155-190 grams  Fluid:  1L plus UOP    Mertie Clause, MS, RD, LDN Registered Dietitian II Please see AMiON for contact information.

## 2023-07-14 ENCOUNTER — Encounter (HOSPITAL_COMMUNITY)
Admission: RE | Disposition: E | Payer: Self-pay | Source: Home / Self Care | Attending: Thoracic Surgery (Cardiothoracic Vascular Surgery)

## 2023-07-14 ENCOUNTER — Inpatient Hospital Stay (HOSPITAL_COMMUNITY): Payer: Medicare HMO | Admitting: Anesthesiology

## 2023-07-14 ENCOUNTER — Encounter (HOSPITAL_COMMUNITY): Payer: Self-pay | Admitting: Thoracic Surgery (Cardiothoracic Vascular Surgery)

## 2023-07-14 ENCOUNTER — Inpatient Hospital Stay (HOSPITAL_COMMUNITY)
Admission: RE | Disposition: E | Payer: Self-pay | Source: Home / Self Care | Attending: Thoracic Surgery (Cardiothoracic Vascular Surgery)

## 2023-07-14 DIAGNOSIS — N186 End stage renal disease: Secondary | ICD-10-CM | POA: Diagnosis not present

## 2023-07-14 DIAGNOSIS — I50811 Acute right heart failure: Secondary | ICD-10-CM | POA: Diagnosis not present

## 2023-07-14 DIAGNOSIS — N185 Chronic kidney disease, stage 5: Secondary | ICD-10-CM | POA: Diagnosis not present

## 2023-07-14 DIAGNOSIS — I509 Heart failure, unspecified: Secondary | ICD-10-CM

## 2023-07-14 DIAGNOSIS — I5023 Acute on chronic systolic (congestive) heart failure: Secondary | ICD-10-CM | POA: Diagnosis not present

## 2023-07-14 DIAGNOSIS — I4891 Unspecified atrial fibrillation: Secondary | ICD-10-CM | POA: Diagnosis not present

## 2023-07-14 DIAGNOSIS — I132 Hypertensive heart and chronic kidney disease with heart failure and with stage 5 chronic kidney disease, or end stage renal disease: Secondary | ICD-10-CM

## 2023-07-14 DIAGNOSIS — Z9889 Other specified postprocedural states: Secondary | ICD-10-CM | POA: Diagnosis not present

## 2023-07-14 DIAGNOSIS — I459 Conduction disorder, unspecified: Secondary | ICD-10-CM | POA: Diagnosis not present

## 2023-07-14 HISTORY — PX: LIGATION OF ARTERIOVENOUS  FISTULA: SHX5948

## 2023-07-14 HISTORY — PX: RIGHT HEART CATH: CATH118263

## 2023-07-14 LAB — RENAL FUNCTION PANEL
Albumin: 2.6 g/dL — ABNORMAL LOW (ref 3.5–5.0)
Albumin: 2.5 g/dL — ABNORMAL LOW (ref 3.5–5.0)
Anion gap: 7 (ref 5–15)
Anion gap: 8 (ref 5–15)
BUN: 21 mg/dL — ABNORMAL HIGH (ref 6–20)
BUN: 27 mg/dL — ABNORMAL HIGH (ref 6–20)
CO2: 27 mmol/L (ref 22–32)
CO2: 25 mmol/L (ref 22–32)
Calcium: 10.9 mg/dL — ABNORMAL HIGH (ref 8.9–10.3)
Calcium: 12.2 mg/dL — ABNORMAL HIGH (ref 8.9–10.3)
Chloride: 100 mmol/L (ref 98–111)
Chloride: 101 mmol/L (ref 98–111)
Creatinine, Ser: 1.67 mg/dL — ABNORMAL HIGH (ref 0.61–1.24)
Creatinine, Ser: 2.23 mg/dL — ABNORMAL HIGH (ref 0.61–1.24)
GFR, Estimated: 50 mL/min — ABNORMAL LOW (ref >60)
GFR, Estimated: 35 mL/min — ABNORMAL LOW (ref >60)
Glucose, Bld: 143 mg/dL — ABNORMAL HIGH (ref 70–99)
Glucose, Bld: 103 mg/dL — ABNORMAL HIGH (ref 70–99)
Phosphorus: 2.3 mg/dL — ABNORMAL LOW (ref 2.5–4.6)
Phosphorus: 3.6 mg/dL (ref 2.5–4.6)
Potassium: 4.3 mmol/L (ref 3.5–5.1)
Potassium: 4.8 mmol/L (ref 3.5–5.1)
Sodium: 134 mmol/L — ABNORMAL LOW (ref 135–145)
Sodium: 134 mmol/L — ABNORMAL LOW (ref 135–145)

## 2023-07-14 LAB — POCT I-STAT EG7
Acid-Base Excess: 2 mmol/L (ref 0.0–2.0)
Acid-Base Excess: 2 mmol/L (ref 0.0–2.0)
Bicarbonate: 27.6 mmol/L (ref 20.0–28.0)
Bicarbonate: 27.9 mmol/L (ref 20.0–28.0)
Calcium, Ion: 1.58 mmol/L (ref 1.15–1.40)
Calcium, Ion: 1.59 mmol/L (ref 1.15–1.40)
HCT: 28 % — ABNORMAL LOW (ref 39.0–52.0)
HCT: 29 % — ABNORMAL LOW (ref 39.0–52.0)
Hemoglobin: 9.5 g/dL — ABNORMAL LOW (ref 13.0–17.0)
Hemoglobin: 9.9 g/dL — ABNORMAL LOW (ref 13.0–17.0)
O2 Saturation: 61 %
O2 Saturation: 61 %
Potassium: 4.6 mmol/L (ref 3.5–5.1)
Potassium: 4.6 mmol/L (ref 3.5–5.1)
Sodium: 137 mmol/L (ref 135–145)
Sodium: 137 mmol/L (ref 135–145)
TCO2: 29 mmol/L (ref 22–32)
TCO2: 29 mmol/L (ref 22–32)
pCO2, Ven: 49.5 mmHg (ref 44–60)
pCO2, Ven: 51.3 mmHg (ref 44–60)
pH, Ven: 7.344 (ref 7.25–7.43)
pH, Ven: 7.354 (ref 7.25–7.43)
pO2, Ven: 34 mmHg (ref 32–45)
pO2, Ven: 34 mmHg (ref 32–45)

## 2023-07-14 LAB — GLUCOSE, CAPILLARY
Glucose-Capillary: 107 mg/dL — ABNORMAL HIGH (ref 70–99)
Glucose-Capillary: 116 mg/dL — ABNORMAL HIGH (ref 70–99)
Glucose-Capillary: 130 mg/dL — ABNORMAL HIGH (ref 70–99)
Glucose-Capillary: 137 mg/dL — ABNORMAL HIGH (ref 70–99)

## 2023-07-14 LAB — COOXEMETRY PANEL
Carboxyhemoglobin: 2.8 % — ABNORMAL HIGH (ref 0.5–1.5)
Methemoglobin: 0.8 % (ref 0.0–1.5)
O2 Saturation: 82 %
Total hemoglobin: 8.9 g/dL — ABNORMAL LOW (ref 12.0–16.0)

## 2023-07-14 LAB — MAGNESIUM: Magnesium: 2.6 mg/dL — ABNORMAL HIGH (ref 1.7–2.4)

## 2023-07-14 SURGERY — RIGHT HEART CATH
Anesthesia: LOCAL

## 2023-07-14 SURGERY — LIGATION OF ARTERIOVENOUS  FISTULA
Anesthesia: Monitor Anesthesia Care | Laterality: Right

## 2023-07-14 MED ORDER — LIDOCAINE 2% (20 MG/ML) 5 ML SYRINGE
INTRAMUSCULAR | Status: AC
  Filled 2023-07-14: qty 5

## 2023-07-14 MED ORDER — LIDOCAINE HCL (PF) 1 % IJ SOLN
INTRAMUSCULAR | Status: AC
  Filled 2023-07-14: qty 30

## 2023-07-14 MED ORDER — DEXAMETHASONE SODIUM PHOSPHATE 10 MG/ML IJ SOLN
INTRAMUSCULAR | Status: AC
  Filled 2023-07-14: qty 1

## 2023-07-14 MED ORDER — 0.9 % SODIUM CHLORIDE (POUR BTL) OPTIME
TOPICAL | Status: DC | PRN
  Administered 2023-07-14: 1000 mL

## 2023-07-14 MED ORDER — ONDANSETRON HCL 4 MG/2ML IJ SOLN
INTRAMUSCULAR | Status: AC
  Filled 2023-07-14: qty 2

## 2023-07-14 MED ORDER — MIDAZOLAM HCL 2 MG/2ML IJ SOLN
INTRAMUSCULAR | Status: AC
  Filled 2023-07-14: qty 2

## 2023-07-14 MED ORDER — PROPOFOL 10 MG/ML IV BOLUS
INTRAVENOUS | Status: AC
  Filled 2023-07-14: qty 20

## 2023-07-14 MED ORDER — PROPOFOL 500 MG/50ML IV EMUL
INTRAVENOUS | Status: DC | PRN
Start: 2023-07-14 — End: 2023-07-14
  Administered 2023-07-14: 10 ug/kg/min via INTRAVENOUS

## 2023-07-14 MED ORDER — LIDOCAINE HCL (PF) 1 % IJ SOLN
INTRAMUSCULAR | Status: DC | PRN
  Administered 2023-07-14: 5 mL via INTRADERMAL

## 2023-07-14 MED ORDER — LIDOCAINE-EPINEPHRINE (PF) 1 %-1:200000 IJ SOLN
INTRAMUSCULAR | Status: AC
  Filled 2023-07-14: qty 30

## 2023-07-14 MED ORDER — FENTANYL CITRATE (PF) 250 MCG/5ML IJ SOLN
INTRAMUSCULAR | Status: AC
  Filled 2023-07-14: qty 5

## 2023-07-14 MED ORDER — LIDOCAINE-EPINEPHRINE (PF) 1 %-1:200000 IJ SOLN
INTRAMUSCULAR | Status: DC | PRN
  Administered 2023-07-14: 10 mL

## 2023-07-14 SURGICAL SUPPLY — 35 items
ADH SKN CLS APL DERMABOND .7 (GAUZE/BANDAGES/DRESSINGS) ×1
BAG COUNTER SPONGE SURGICOUNT (BAG) ×2 IMPLANT
BAG SPNG CNTER NS LX DISP (BAG) ×1
CANISTER SUCT 3000ML PPV (MISCELLANEOUS) ×2 IMPLANT
CANNULA VESSEL 3MM 2 BLNT TIP (CANNULA) IMPLANT
CLIP TI MEDIUM 6 (CLIP) ×2 IMPLANT
CLIP TI WIDE RED SMALL 6 (CLIP) ×2 IMPLANT
DERMABOND ADVANCED .7 DNX12 (GAUZE/BANDAGES/DRESSINGS) ×2 IMPLANT
DRAPE ORTHO SPLIT 77X108 STRL (DRAPES) ×1
DRAPE SURG ORHT 6 SPLT 77X108 (DRAPES) IMPLANT
ELECT REM PT RETURN 9FT ADLT (ELECTROSURGICAL) ×1
ELECTRODE REM PT RTRN 9FT ADLT (ELECTROSURGICAL) ×2 IMPLANT
GLOVE BIO SURGEON STRL SZ7.5 (GLOVE) ×2 IMPLANT
GLOVE BIOGEL PI IND STRL 8 (GLOVE) ×2 IMPLANT
GLOVE SURG POLY ORTHO LF SZ7.5 (GLOVE) IMPLANT
GLOVE SURG UNDER LTX SZ8 (GLOVE) ×2 IMPLANT
GOWN STRL REUS W/ TWL LRG LVL3 (GOWN DISPOSABLE) ×6 IMPLANT
GOWN STRL REUS W/TWL LRG LVL3 (GOWN DISPOSABLE) ×3
KIT BASIN OR (CUSTOM PROCEDURE TRAY) ×2 IMPLANT
KIT TURNOVER KIT B (KITS) ×2 IMPLANT
NDL HYPO 25GX1X1/2 BEV (NEEDLE) IMPLANT
NEEDLE HYPO 25GX1X1/2 BEV (NEEDLE) IMPLANT
NS IRRIG 1000ML POUR BTL (IV SOLUTION) ×2 IMPLANT
PACK CV ACCESS (CUSTOM PROCEDURE TRAY) ×2 IMPLANT
PAD ARMBOARD 7.5X6 YLW CONV (MISCELLANEOUS) ×4 IMPLANT
SPONGE SURGIFOAM ABS GEL 100 (HEMOSTASIS) IMPLANT
SUT ETHILON 3 0 PS 1 (SUTURE) IMPLANT
SUT MNCRL AB 4-0 PS2 18 (SUTURE) ×2 IMPLANT
SUT PROLENE 6 0 BV (SUTURE) IMPLANT
SUT SILK 0 TIES 10X30 (SUTURE) ×2 IMPLANT
SUT VIC AB 3-0 SH 27 (SUTURE) ×1
SUT VIC AB 3-0 SH 27X BRD (SUTURE) ×2 IMPLANT
TOWEL GREEN STERILE (TOWEL DISPOSABLE) ×2 IMPLANT
UNDERPAD 30X36 HEAVY ABSORB (UNDERPADS AND DIAPERS) ×2 IMPLANT
WATER STERILE IRR 1000ML POUR (IV SOLUTION) ×2 IMPLANT

## 2023-07-14 SURGICAL SUPPLY — 7 items
CATH SWAN GANZ 7F STRAIGHT (CATHETERS) IMPLANT
GUIDEWIRE .025 260CM (WIRE) IMPLANT
KIT MICROPUNCTURE NIT STIFF (SHEATH) IMPLANT
MAT PREVALON FULL STRYKER (MISCELLANEOUS) IMPLANT
PACK CARDIAC CATHETERIZATION (CUSTOM PROCEDURE TRAY) ×2 IMPLANT
SHEATH PINNACLE 7F 10CM (SHEATH) IMPLANT
TRANSDUCER W/STOPCOCK (MISCELLANEOUS) IMPLANT

## 2023-07-14 NOTE — Progress Notes (Signed)
PT Cancellation Note  Patient Details Name: [name] MRN: [id number] DOB: 06/20/1971   Cancelled Treatment:    Reason Eval/Treat Not Completed: Medical issues which prohibited therapy.  Pt on bedrest from R heart cath and going back to OR in about an hour.  Will not be available for the rest of the day. 07/09/2023  [name]., PT Acute Rehabilitation Services [phone]  (office)   [name] 07/20/2023, 12:47 PM

## 2023-07-14 NOTE — Op Note (Signed)
NAME: [name]    MRN: [id number] DOB: 1970-12-10    DATE OF OPERATION: 07-30-2023  PREOP DIAGNOSIS:    Heart failure  POSTOP DIAGNOSIS:    Same  PROCEDURE:    Ligation right upper arm AV fistula  SURGEON: Di Kindle. Edilia Bo, MD  ASSIST: Clinton Gallant, PA  ANESTHESIA: Local with minimal sedation  EBL: Minimal  INDICATIONS:    [name] is a 52 y.o. male with significant heart failure.  We were asked to ligate his fistula.  FINDINGS:   Brisk right radial signal at the completion of the procedure.  TECHNIQUE:   The patient was taken to the operating room.  We left the patient on the bed.  The right arm was prepped and draped in usual sterile fashion after the patient was sedated by anesthesia.  The skin was anesthetized with 1% lidocaine with epinephrine.  A longitudinal incision was made over the proximal fistula.  The fistula here was dissected free and ligated with two 2-0 silk ties.  At the completion there was no thrill in the fistula.  There was a good radial signal with the Doppler.  Hemostasis was obtained in the wound.  The wound was closed with a deep layer of 3-0 Vicryl and the skin closed with 4-0 Monocryl.  Dermabond was applied.  The patient tolerated procedure well was transferred to the recovery room in stable condition.  All needle and sponge counts were correct.    Waverly Ferrari, MD, FACS [facility]  DATE OF DICTATION:   07-30-23

## 2023-07-14 NOTE — Progress Notes (Signed)
08/03/2023 1955  BiPAP/CPAP/SIPAP  BiPAP/CPAP/SIPAP Pt Type Adult  BiPAP/CPAP/SIPAP V60  Reason BIPAP/CPAP not in use Non-compliant

## 2023-07-14 NOTE — Progress Notes (Signed)
Telemetry/chart check note V paced, no bradycardia  Remains on:  Dobutamine > off Epi Nor-epi >> off Vaso Midodrine > 20mg  >> 30mg  TID  CVVHD  On Tube feeds  RHC w/severe PAH   Pt asked palliative care no longer follow Remains full code  Temp wire assessment this morning remains unchanged CHB persists 49-50 this AM Temp pacing wire  Threshold today remains unchanged <8mA Left at 8 and 82bpm  Still, not a candidate for EP procedures, PPM implant  Francis Dowse, PA-C

## 2023-07-14 NOTE — Transfer of Care (Signed)
Immediate Anesthesia Transfer of Care Note  Patient: [name]  Procedure(s) Performed: LIGATION OF RIGHT ARTERIOVENOUS  FISTULA (Right)  Patient Location: ICU  Anesthesia Type:MAC  Level of Consciousness: awake, alert , and patient cooperative  Airway & Oxygen Therapy: Patient connected to nasal cannula oxygen  Post-op Assessment: Report given to RN and Post -op Vital signs reviewed and stable  Post vital signs: Reviewed and stable  Last Vitals:  Vitals Value Taken Time  BP    Temp    Pulse    Resp    SpO2      Last Pain:  Vitals:   07/11/2023 1200  TempSrc:   PainSc: 2       Patients Stated Pain Goal: 0 (07/13/23 1600)  Complications: No notable events documented.

## 2023-07-14 NOTE — Anesthesia Postprocedure Evaluation (Signed)
Anesthesia Post Note  Patient: [name]  Procedure(s) Performed: LIGATION OF RIGHT ARTERIOVENOUS  FISTULA (Right)     Patient location during evaluation: ICU Anesthesia Type: MAC Level of consciousness: awake and alert Pain management: pain level controlled Vital Signs Assessment: post-procedure vital signs reviewed and stable Respiratory status: spontaneous breathing, nonlabored ventilation, respiratory function stable and patient connected to nasal cannula oxygen Cardiovascular status: stable and blood pressure returned to baseline Postop Assessment: no apparent nausea or vomiting Anesthetic complications: no   No notable events documented.  Last Vitals:  Vitals:   07/18/2023 2100 07/28/2023 2200  BP:    Pulse: 82 82  Resp: (!) 25 (!) 26  Temp:    SpO2: 100% 100%    Last Pain:  Vitals:   08/01/2023 2042  TempSrc: Axillary  PainSc:                  Collene Schlichter

## 2023-07-14 NOTE — Progress Notes (Signed)
NAME:  Pledger Debenedetti, MRN:  [id number], DOB:  [birth year], LOS: 22 ADMISSION DATE:  07/03/2023, CONSULTATION DATE: 06/08/2023 REFERRING MD: Leafy Ro - TCTS, CHIEF COMPLAINT: Postcardiac surgery  History of Present Illness:  This is a 52 year old gentleman, past medical history of anxiety depression, bipolar disease, congestive heart failure chronic diastolic heart failure kidney disease followed by renal transplant in 1998-10-25 repeat renal transplant in 10/25/2014.  Patient underwent surgery today for severe mitral regurgitation severe tricuspid regurgitation and moderate aortic stenosis.  He also has atrial fibrillation received maze procedure.  He had a complex mitral valve repair a tricuspid valve annuloplasty and aortic valve replacement followed by full right and left maze procedure.  Pertinent Medical History:   Past Medical History:  Diagnosis Date   ADHD (attention deficit hyperactivity disorder)    Anemia    Anemia, chronic disease 07/13/2011   Anxiety    Arthritis    Bipolar 2 disorder (HCC) 08/18/2011   Diagnosed in 10/25/2010.    Bipolar disorder (HCC)    Blood dyscrasia    Nephrotic Anemia   Blood transfusion    Blood transfusion without reported diagnosis    Chronic diastolic CHF (congestive heart failure) (HCC)    Complication of anesthesia    Woke up intubated gets combative  afraid to be alone    Congestive heart failure (CHF) (HCC) 07/13/2011   Onset 10/25/05.  Followed by Dr. Eden Emms.  S/p cardiac catheterization in [facility] Dr. Anne Fu..  S/p cardiac catheterization in 2013/10/25 by Nishan.  Echo 10-25-13.    Coronary artery disease    Depression    Dysrhythmia    A. Fib Oct 26, 2023 with DCCV in May 2024   Erectile dysfunction    ESRD on hemodialysis (HCC) 07/13/2011   Glomerulonephritis at age 47, started HD in 10-26-95.  Deceased donor renal transplant 1998/10/25 at [facility], then kidney failed and went back on HD in 25-Oct-2005.  L forearm AVF . s/p repeat renal transplant 10/2014 [facility].   GI bleed 11/01/2011   Rectal  bleeding with emesis/diarrhea    Heart murmur    Hemodialysis status (HCC)    HSV (herpes simplex virus) infection    Hydrocele, right 11/25/2014   Hypertension    Hypertensive emergency 01/06/2013   Hypoglycemia 01/11/2013   Influenza-like illness 12/18/2013   Mild ascending aorta dilatation (HCC)    NICM (nonischemic cardiomyopathy) (HCC)    Pericardial effusion    a. Mod by echo in 10-25-2013, similar to prior.   Peripheral polyneuropathy 08/04/2016   Pneumonia    Pulmonary edema 01/06/2013   Pulmonary hypertension associated with end stage renal disease on dialysis [facility]) 07/13/2011   Renal transplant, status post 11/25/2014   Pt with Glomerulonephritis at age 33. Deceased donor renal transplant 10-25-98 right and left renal transplant 10/2014.  Followed by [facility] transplant team; nephrologist is Dr. Lonna Cobb.    Respiratory failure with hypoxia (HCC) 01/06/2013   Shortness of breath 12/12/2012   Sleep apnea    Swollen testicle 07/13/2011   URI (upper respiratory infection) 01/25/2012    Significant Hospital Events: Including procedures, antibiotic start and stop dates in addition to other pertinent events   8/15 - AVR, MVR, TVR with TCTS ([place]). Remains on multiple pressors (NE, Epi, vaso). 8/16 - LIJ Trialysis catheter placed for CRRT.  8/17 extubated  8/18 - remains on CVVHD, 3 pressors  8/19-8/20 improving pressor needs 8/21 agitated delirium; cortrak 8/26 CRRT restarted 08-02-23 TVP placed, brady overnight and pacing turned to 80 8/30 palli consult  9/1 PRBC for low coox  9/2 coox down a bit again  9/3CRRT off overnight due to access issues. Remains on vaso 0.04, epi 10, norepi 2, DBA 5. 9/4 RHC, started iNO  Interim History / Subjective:  Feeling ok today. Had an episode yesterday afternoon of disconnection of iNO-- quickly developed SOB which responded to being restarted on it. Today breathing feels fine.   Objective:  Blood pressure (!) 126/43, pulse 82,  temperature 97.7 F (36.5 C), temperature source Oral, resp. rate (!) 23, height 6\' 3"  (1.905 m), weight 79 kg, SpO2 100%. CVP:  [13 mmHg-39 mmHg] 21 mmHg      Intake/Output Summary (Last 24 hours) at 08/03/2023 0922 Last data filed at 07/19/2023 0900 Gross per 24 hour  Intake 3668.76 ml  Output 4786.1 ml  Net -1117.34 ml   Filed Weights   07/22/2023 0500 07/13/23 0451 08/03/2023 0453  Weight: 82.8 kg 81.1 kg 79 kg   Physical Examination:    General: chronically ill appearing man lying in bed in NAD HEENT: [place]/AT, eyes anicteric CV: S1S2, RRR- paced Pulm: breathing comfortably on [place] & iNO, no tachypnea or conversational dyspnea. CTAB. GI: soft, NT   Derm: no diffuse rashes MSK: minimal muscle mass, RUE fistula.  Neuro: awake and alert, answering questions, still has a very weak voice.   BUN 21, Cr1.67 on CRRT Coox 82%   Resolved issues:  Possible PNA. Right pleural effusion - s/p pigtail chest tube with subsequent removal 8/25.   Assessment & Plan:   ICU delirium, acute encephalopathy - improving.  -BiPAP PRN; hypercapnia has been less of an issue this week.  -delirium precautions; limit benzos as able.  -mobility limited with need for CRRT;  -family has been at bedside frequently -con't lexapro, con't nightly seroquel  Acute respiratory failure with hypoxia & hypercapnia -con't iNO, supplemental O2 -bipap PRN -pulmonary hygiene -volume management with CRRT  MVR, TVR, AS s/p bioprosthetic replacement CHB/ post op junctional rhythm s/p TVP (8/27) Cardiogenic shock, RV failure, high output heart failure Enlarging pericardial effusion w/o tamponade AoC HFrEF> currently EF recovered Afib s/p MAZE  -post op distributive/vasoplegia, improved. Possible sepsis now off abx. Has RV failure. Pericardial effusion without tamponade physiology P -appreciate TCTS -planning for fistula ligation -hopefully can repeat RHC to determine if he is responding to iNO to determine a  post-op plan with weaning off iNO, possibly to oral PDE5 -appreciate EP's management; is pacer-dependent due to bradycardia -con't midodrine -weaning pressors for goal SBP >95; not weaning epi for RV support yet   ESRD; CKD s/p failed renal transplants x 2 (1999, 2015)  Hypophosphatemia Hyponatremia  Hypokalemia  Hypercalcemia  Hypermagnesemia  History of kidney disease status post renal transplant (failed 1999 and 2015). P -con't CRRT per nephrology -ligating fistula; will be high risk for infection with permacath and valves -strict I/O -renally dose meds  H/o HSV -con't PTA acyclovir  Anemia - expected ABLA (resolved), critical illness, chronic dz  -transfuse for Hb <7 or hemodynamically significant bleeding -aranesp per nephro  Moderate malnutrition  -con't TF-- tolerating well. Holding for OR later.  -con't reglan, hopefully can wean in the coming days  Possible gout flare -con't colchicine  Physical deconditioning  -Needs PT, OT, SLP. Anticipate he will require CIR post-discharge.   GOC -Family had been updated 9/3. Full aggressive care desired.     Best Practice (right click and "Reselect all SmartList Selections" daily)   Diet/type:TF - on hold for OR this morning DVT prophylaxis:heparin,  coumadin GI prophylaxis: PPI Lines: Central line, Dialysis Catheter, and Arterial Line Foley:  No  Code Status:  full code Last date of multidisciplinary goals of care discussion [Per Primary Team]  This patient is critically ill with multiple organ system failure which requires frequent high complexity decision making, assessment, support, evaluation, and titration of therapies. This was completed through the application of advanced monitoring technologies and extensive interpretation of multiple databases. During this encounter critical care time was devoted to patient care services described in this note for 40 minutes.  Steffanie Dunn, DO 07/23/2023 9:32 AM [facility]  For contact information, see Amion. If no response to pager, please call PCCM consult pager. After hours, 7PM- 7AM, please call Elink.

## 2023-07-14 NOTE — H&P (View-Only) (Signed)
remains tenuous. Will follow course post AVF ligation   Length of Stay: [address] PA-C  07/10/2023, 8:17 AM  Advanced Heart Failure Team Pager [phone] (M-F; 7a - 5p)  Please contact [facility] for night-coverage after hours (5p -7a ) and weekends on [email or web address]  Agree with above.  More lethargic today. Remains on multiple pressors and CVHD. Felt worse when iNO was stopped.   General:  Sitting up in bed. Lethargic  No resp difficulty HEENT: normal + cor-trak Neck: supple.RIJ TVP LIJ HD cath Carotids 2+ bilat; no bruits. No lymphadenopathy or thryomegaly appreciated. DGL:OVFIEPP rate & rhythm. No rubs, gallops or murmurs. Lungs: coarse Abdomen: soft, nontender, nondistended. No hepatosplenomegaly. No bruits or masses. Good bowel sounds. Extremities: no cyanosis, clubbing, rash, 1+ edema + RUE AVF Neuro: lethargic but communicative. Moves all 4  Remains very tenuous. Unable to wean pressors much. D/w TCTS, VVS and CCM. Will plan repeat RHC today to assess response to iNO followed by OR for ligation of AVF. Once AVF ligated can consider careful initiation of PDE-5i.   CRITICAL CARE Performed by: Arvilla Meres  Total critical care time: 50 minutes  Critical care time was exclusive of separately billable procedures and treating other patients.  Critical care was necessary to treat or prevent imminent or life-threatening deterioration.  Critical care was time spent personally by me (independent of midlevel providers or residents) on the following activities: development of treatment plan with patient and/or surrogate as well as nursing, discussions with consultants, evaluation of patient's response to treatment, examination of patient, obtaining  history from patient or surrogate, ordering and performing treatments and interventions, ordering and review of laboratory studies, ordering and review of radiographic studies, pulse oximetry and re-evaluation of patient's condition.  Arvilla Meres, MD  9:19 AM  Advanced Heart Failure Rounding Note  PCP-Cardiologist: Reatha Harps, MD   Subjective:   8/15: Elective triple vale surgery + MAZE. Underwent aortic valve replacement + mitral and tricuspid valve repairs by Dr. Milinda Antis 8/22: AHF consulted for high-output HF with persistent post-op shock 8/25: CRRT stopped 8/26: CRRT restarted  8/27: S/p TTVP Echo: EF 70-75%, severe LVH, RV mod reduced, RV severely enlarged. LA/RA mod dilated. Large pericardial effusion. No evidence of tamponade.  9/4: RHC c/w severe PAH with normal output and low SVR. PVR 4-5. SBP improved nearly 20 points with AVF occlusion.   Remains pressor dependent. Did not tolerate VP wean. Added back overnight, currently on 0.02. Continues on NE 10 + Midodrine 20 tid. iNO 20   CO-OX 82%   Remains in CVVHD. Pulling ~65 cc/hr. CVP ~16   Remains on TFs.   Weak/fatigued appearing. Follows commands. No distress.   Objective:   Findings: Resting numbers on multiple pressors Ao = 122/54 (72) RA = 22 RV = 92/24 PA = 98/33 (55) PCW = 28 (v to 37) Fick cardiac output/index = 6.9/3.3 Thermo CO/CI = 7.2/3.4 PVR = 4.2 (Fick) Thermo (3.8) SVR = 560 FA sat = 96% PA sat = 56% PAPI = 3   With AVF occluded Ao = 132/64 (82) RA = 22 PA = 97/33 (55) PCW = 28 (v to 35) Fick cardiac output/index = 5.9/2.8 Thermo CO/CI = 6.0/2.8 PVR = 4.6 (Fick) 4.5 (Thermo) SVR = 800 FA sat = 98% PA sat = 53% PAPI = 3  Assessment: - Complicated hemodynamics. There is severe pulmonary HTN with preserved PAPI. PAH seems to be multifactorial due to high out, increased PVR and elevated left-sided pressures.  - The MAP increased 10 points with occlusion of AVF with increase in SVR and decrease in CO.  - Will attempt to address pulmonary pressures with iNO and low-dose sildenafil. If no benefit will consider ligation of AVF to help facilitate liberation from pressor support.   Weight Range: 79 kg Body mass index is 21.77 kg/m.   Vital  Signs:   Temp:  [97.1 F (36.2 C)-98.8 F (37.1 C)] 97.7 F (36.5 C) (09/06 0759) Pulse Rate:  [79-81] 80 (09/06 0700) Resp:  [20-37] 33 (09/06 0700) BP: (79-126)/(17-43) 126/43 (09/05 2342) SpO2:  [97 %-100 %] 100 % (09/06 0700) Arterial Line BP: (102-131)/(38-53) 125/44 (09/06 0700) Weight:  [79 kg] 79 kg (09/06 0453) Last BM Date : 07/13/23  Weight change: Filed Weights   07/13/2023 0500 07/13/23 0451 08/04/2023 0453  Weight: 82.8 kg 81.1 kg 79 kg   Intake/Output:   Intake/Output Summary (Last 24 hours) at 07/20/2023 0817 Last data filed at 08/05/2023 0800 Gross per 24 hour  Intake 3674.56 ml  Output 4958.6 ml  Net -1284.04 ml    Physical Exam   CVP 16  General:  fatigued/ weak appearing. No respiratory difficulty. NAD  HEENT: normal. +cortak Neck: supple. JVD elevated to jaw Carotids 2+ bilat; no bruits. No lymphadenopathy or thyromegaly appreciated.  + Rt internal jugular CVC, +lt internal jugular HD cath  Cor: PMI nondisplaced. Regular rate & rhythm. No rubs, gallops or murmurs. Lungs: decreased BS at the bases  Abdomen: soft, nontender, nondistended. No hepatosplenomegaly. No bruits or masses. Good bowel sounds. Extremities: no cyanosis, clubbing, rash, edema. Fistula RUE +bruit/thrill.  Neuro: alert & oriented x 3, cranial nerves grossly intact. moves all 4 extremities w/o difficulty. Affect flat   Telemetry   V paced 80s. Personally reviewed  Labs  Advanced Heart Failure Rounding Note  PCP-Cardiologist: Reatha Harps, MD   Subjective:   8/15: Elective triple vale surgery + MAZE. Underwent aortic valve replacement + mitral and tricuspid valve repairs by Dr. Milinda Antis 8/22: AHF consulted for high-output HF with persistent post-op shock 8/25: CRRT stopped 8/26: CRRT restarted  8/27: S/p TTVP Echo: EF 70-75%, severe LVH, RV mod reduced, RV severely enlarged. LA/RA mod dilated. Large pericardial effusion. No evidence of tamponade.  9/4: RHC c/w severe PAH with normal output and low SVR. PVR 4-5. SBP improved nearly 20 points with AVF occlusion.   Remains pressor dependent. Did not tolerate VP wean. Added back overnight, currently on 0.02. Continues on NE 10 + Midodrine 20 tid. iNO 20   CO-OX 82%   Remains in CVVHD. Pulling ~65 cc/hr. CVP ~16   Remains on TFs.   Weak/fatigued appearing. Follows commands. No distress.   Objective:   Findings: Resting numbers on multiple pressors Ao = 122/54 (72) RA = 22 RV = 92/24 PA = 98/33 (55) PCW = 28 (v to 37) Fick cardiac output/index = 6.9/3.3 Thermo CO/CI = 7.2/3.4 PVR = 4.2 (Fick) Thermo (3.8) SVR = 560 FA sat = 96% PA sat = 56% PAPI = 3   With AVF occluded Ao = 132/64 (82) RA = 22 PA = 97/33 (55) PCW = 28 (v to 35) Fick cardiac output/index = 5.9/2.8 Thermo CO/CI = 6.0/2.8 PVR = 4.6 (Fick) 4.5 (Thermo) SVR = 800 FA sat = 98% PA sat = 53% PAPI = 3  Assessment: - Complicated hemodynamics. There is severe pulmonary HTN with preserved PAPI. PAH seems to be multifactorial due to high out, increased PVR and elevated left-sided pressures.  - The MAP increased 10 points with occlusion of AVF with increase in SVR and decrease in CO.  - Will attempt to address pulmonary pressures with iNO and low-dose sildenafil. If no benefit will consider ligation of AVF to help facilitate liberation from pressor support.   Weight Range: 79 kg Body mass index is 21.77 kg/m.   Vital  Signs:   Temp:  [97.1 F (36.2 C)-98.8 F (37.1 C)] 97.7 F (36.5 C) (09/06 0759) Pulse Rate:  [79-81] 80 (09/06 0700) Resp:  [20-37] 33 (09/06 0700) BP: (79-126)/(17-43) 126/43 (09/05 2342) SpO2:  [97 %-100 %] 100 % (09/06 0700) Arterial Line BP: (102-131)/(38-53) 125/44 (09/06 0700) Weight:  [79 kg] 79 kg (09/06 0453) Last BM Date : 07/13/23  Weight change: Filed Weights   07/13/2023 0500 07/13/23 0451 08/04/2023 0453  Weight: 82.8 kg 81.1 kg 79 kg   Intake/Output:   Intake/Output Summary (Last 24 hours) at 07/20/2023 0817 Last data filed at 08/05/2023 0800 Gross per 24 hour  Intake 3674.56 ml  Output 4958.6 ml  Net -1284.04 ml    Physical Exam   CVP 16  General:  fatigued/ weak appearing. No respiratory difficulty. NAD  HEENT: normal. +cortak Neck: supple. JVD elevated to jaw Carotids 2+ bilat; no bruits. No lymphadenopathy or thyromegaly appreciated.  + Rt internal jugular CVC, +lt internal jugular HD cath  Cor: PMI nondisplaced. Regular rate & rhythm. No rubs, gallops or murmurs. Lungs: decreased BS at the bases  Abdomen: soft, nontender, nondistended. No hepatosplenomegaly. No bruits or masses. Good bowel sounds. Extremities: no cyanosis, clubbing, rash, edema. Fistula RUE +bruit/thrill.  Neuro: alert & oriented x 3, cranial nerves grossly intact. moves all 4 extremities w/o difficulty. Affect flat   Telemetry   V paced 80s. Personally reviewed  Labs  remains tenuous. Will follow course post AVF ligation   Length of Stay: [address] PA-C  07/10/2023, 8:17 AM  Advanced Heart Failure Team Pager [phone] (M-F; 7a - 5p)  Please contact [facility] for night-coverage after hours (5p -7a ) and weekends on [email or web address]  Agree with above.  More lethargic today. Remains on multiple pressors and CVHD. Felt worse when iNO was stopped.   General:  Sitting up in bed. Lethargic  No resp difficulty HEENT: normal + cor-trak Neck: supple.RIJ TVP LIJ HD cath Carotids 2+ bilat; no bruits. No lymphadenopathy or thryomegaly appreciated. DGL:OVFIEPP rate & rhythm. No rubs, gallops or murmurs. Lungs: coarse Abdomen: soft, nontender, nondistended. No hepatosplenomegaly. No bruits or masses. Good bowel sounds. Extremities: no cyanosis, clubbing, rash, 1+ edema + RUE AVF Neuro: lethargic but communicative. Moves all 4  Remains very tenuous. Unable to wean pressors much. D/w TCTS, VVS and CCM. Will plan repeat RHC today to assess response to iNO followed by OR for ligation of AVF. Once AVF ligated can consider careful initiation of PDE-5i.   CRITICAL CARE Performed by: Arvilla Meres  Total critical care time: 50 minutes  Critical care time was exclusive of separately billable procedures and treating other patients.  Critical care was necessary to treat or prevent imminent or life-threatening deterioration.  Critical care was time spent personally by me (independent of midlevel providers or residents) on the following activities: development of treatment plan with patient and/or surrogate as well as nursing, discussions with consultants, evaluation of patient's response to treatment, examination of patient, obtaining  history from patient or surrogate, ordering and performing treatments and interventions, ordering and review of laboratory studies, ordering and review of radiographic studies, pulse oximetry and re-evaluation of patient's condition.  Arvilla Meres, MD  9:19 AM

## 2023-07-14 NOTE — Anesthesia Preprocedure Evaluation (Signed)
Anesthesia Evaluation  Patient identified by MRN, date of birth, ID band Patient awake    Reviewed: Allergy & Precautions, NPO status , Patient's Chart, lab work & pertinent test results, reviewed documented beta blocker date and time   Airway Mallampati: II  TM Distance: >3 FB Neck ROM: Full    Dental  (+) Teeth Intact, Dental Advisory Given   Pulmonary sleep apnea , former smoker   Pulmonary exam normal breath sounds clear to auscultation       Cardiovascular hypertension, Pt. on medications and Pt. on home beta blockers + CAD and +CHF  Normal cardiovascular exam+ dysrhythmias  Rhythm:Regular Rate:Normal  8/15 - AVR, MVR, TVR with TCTS  On iNO via [place]   Neuro/Psych  PSYCHIATRIC DISORDERS Anxiety Depression Bipolar Disorder    Neuromuscular disease    GI/Hepatic negative GI ROS, Neg liver ROS,,,  Endo/Other  negative endocrine ROS    Renal/GU ESRF and DialysisRenal disease     Musculoskeletal  (+) Arthritis ,    Abdominal   Peds  (+) ADHD Hematology  (+) Blood dyscrasia, anemia   Anesthesia Other Findings Day of surgery medications reviewed with the patient.  Reproductive/Obstetrics                              Anesthesia Physical Anesthesia Plan  ASA: 4  Anesthesia Plan: MAC   Post-op Pain Management:    Induction: Intravenous  PONV Risk Score and Plan: 2 and Dexamethasone, Ondansetron and TIVA  Airway Management Planned:   Additional Equipment:   Intra-op Plan:   Post-operative Plan:   Informed Consent: I have reviewed the patients History and Physical, chart, labs and discussed the procedure including the risks, benefits and alternatives for the proposed anesthesia with the patient or authorized representative who has indicated his/her understanding and acceptance.     Dental advisory given  Plan Discussed with: CRNA  Anesthesia Plan Comments:           Anesthesia Quick Evaluation

## 2023-07-14 NOTE — Progress Notes (Signed)
4.8   < > 4.5 4.3   < > = values in this interval not displayed.    Inpatient medications:  acyclovir  200 mg Per Tube BID   aspirin  81 mg Per Tube Daily   atorvastatin  10 mg Per Tube QHS   azelastine  2 spray Each Nare BID   Chlorhexidine Gluconate Cloth  6 each Topical Daily   cinacalcet  30 mg Oral Q supper   colchicine  0.3 mg Per Tube Once every 5 days   darbepoetin (ARANESP) injection - DIALYSIS  150 mcg Subcutaneous Q Tue-1800   docusate  100 mg Per Tube BID   escitalopram  10 mg Per Tube Daily   folic acid  1 mg Per Tube Daily   guaiFENesin  15 mL Per Tube BID   heparin injection (subcutaneous)  5,000 Units Subcutaneous Q8H   [START ON 07/20/2023] influenza vac split trivalent PF  0.5 mL Intramuscular Once   insulin aspart  0-24 Units Subcutaneous Q4H   lidocaine  1 patch Transdermal Q24H   lidocaine-prilocaine  1 Application Topical Q M,W,F   metoCLOPramide  5 mg Per Tube TID   midodrine  30 mg Per Tube TID   multivitamin  1 tablet Per Tube BID   mouth rinse  15 mL Mouth Rinse 4 times per day   pantoprazole (PROTONIX)  IV  40 mg Intravenous Q12H   polyethylene glycol  17 g Per Tube Daily   QUEtiapine  12.5 mg Per Tube QHS   sodium chloride flush  3 mL Intravenous Q12H   thiamine  100 mg Oral Daily     prismasol BGK 4/2.5 400 mL/hr at 08/04/2023 0425    prismasol BGK 4/2.5 400 mL/hr at 07/13/23 2052   sodium chloride Stopped (07/13/23 2203)   sodium chloride 10 mL/hr at 07/23/2023 1229   albumin human Stopped (06/23/23 0040)   epinephrine 10 mcg/min (07/21/2023 1230)   feeding supplement (PIVOT 1.5 CAL) Stopped (07/18/2023 0820)   norepinephrine (LEVOPHED) Adult infusion Stopped (07/13/23 0720)   prismasol BGK 4/2.5 1,800 mL/hr at 07/23/2023 0852   vasopressin 0.03 Units/min (08/03/2023 1228)   sodium chloride, sodium chloride, acetaminophen, albumin human, bisacodyl **OR** bisacodyl, clonazepam, heparin, ondansetron (ZOFRAN) IV, mouth rinse, oxyCODONE, phenol, simethicone, sodium chloride flush  [name] Progress Note  Subjective: I/O net negative 1.0 L  yesterday.   Vitals:   07/19/2023 1048 07/15/2023 1053 07/10/2023 1058 07/28/2023 1215  BP:      Pulse: 82 82 82 82  Resp: (!) 21 (!) 23 (!) 27 12  Temp:      TempSrc:      SpO2: 99% 100%  98%  Weight:      Height:        Physical Exam:       General adult male in bed on [place] O2 Lungs clear but reduced to auscultation bilaterally  Heart S1S2 Abdomen soft nontender nondistended Extremities no edema appreciated Neuro - resting, somnolent, awakens easily Access RUE AVF with bruit and thrill; left internal jugular nontunneled dialysis catheter    OP HD: [place] triad MWF 3rd shift 3h    F250     94kg   2K/2.5 Ca bath   RUE AVF    Summary: [name] is a/an 52 y.o. 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 Underwent aortic biovalve replacement, TV repair, MV repari, bilat Maze procedures.    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 Going for AVF ligation today.   # ESRD - pt was too hypotensive for hemodialysis post-op.  CRRT started 8/16 and remains on CRRT.   # HD access - temp cath clotted overnight. TPA worked and temp cath is back working again   Kohl's shock - remains on 3 pressors + po midodrine 20 tid. Off dobutamine.   # pHTN - severe by RHC. Is now on inhaled NO.   # Volume - improved, wt's stable.  Pulling net neg 50 cc/hr if CVP > 15 (per CHF team).   # Pericardial effusion - per CT surgery and CHF.  We are optimizing volume status with CRRT as above.  Assessed as moderate on last TTE   # Anemia of ESRD - transfuse as needed per primary team. Concurrent  thrombocytopenia improved.  Aranesp increased to 150 mcg weekly on Tuesdays    # MBD ckd -  Phos acceptable.  No binders currently as on CRRT - normally on renvela.  Ordering PTH. See below for Ca++ issues.   # Hypercalcemia - remains mild w/ corr Ca++ 11.7 today. Suspect due to immobility. Usually mild-mod hypercalcemia does not require treatment. Will follow.    # Vascular access - RUE AVF had been functioning well but have been unable to use due to shock and CRRT requirement.  Temporary dialysis catheter in place for CRRT.  Mark Moselle  MD  [facility] 07/09/2023, 12:38 PM  Recent Labs  Lab 08/04/2023 1434 07/30/2023 1517 07/28/2023 1649 07/13/23 1630 07/27/2023 0434  HGB 8.8* 9.2*  9.5*  --   --   --   ALBUMIN  --   --    < > 2.6* 2.6*  CALCIUM  --   --    < > 10.8* 10.9*  PHOS  --   --    < > 2.0* 2.3*  CREATININE  --   --    < > 1.64* 1.67*  K 4.5 4.4

## 2023-07-14 NOTE — Progress Notes (Signed)
VASCULAR SURGERY:  I spoke with Dr. Gala Romney this morning.  He would like [place] to proceed with ligation of his right arm fistula today.  I have added this onto the schedule.  He is to undergo right heart cath this morning.  We should be able to ligate the fistula this afternoon.  I have discussed this with the patient.  All of his questions were answered and he is agreeable to proceed.  Cari Caraway, MD 8:42 AM

## 2023-07-14 NOTE — Progress Notes (Signed)
[address]       [place] [address]             [phone]      2 Days Post-Op  Procedure(s) (LRB): RIGHT HEART CATH (N/A)   Total Length of Stay:  LOS: 22 days    SUBJECTIVE: Slept last night Restarted vaso due to low diastolic pressure  Vitals:   07/15/2023 0600 07/20/2023 0700  BP:    Pulse: 80 80  Resp: (!) 25 (!) 33  Temp:    SpO2: 100% 100%    Intake/Output      09/05 0701 09/06 0700 09/06 0701 09/07 0700   P.O. 560    I.V. (mL/kg) 1303.3 (16.5)    Other 10.3    NG/GT 1810    Total Intake(mL/kg) 3683.6 (46.6)    Stool 170    CRRT 4610.9    Total Output 4780.9    Net -1097.3              prismasol BGK 4/2.5 400 mL/hr at 07/13/2023 0425    prismasol BGK 4/2.5 400 mL/hr at 07/13/23 2052   sodium chloride Stopped (07/13/23 2203)   sodium chloride 10 mL/hr at 07/19/2023 0700   albumin human Stopped (06/23/23 0040)   epinephrine 10 mcg/min (08/01/2023 0700)   feeding supplement (PIVOT 1.5 CAL) 70 mL/hr at 07/11/2023 0700   norepinephrine (LEVOPHED) Adult infusion Stopped (07/13/23 0720)   prismasol BGK 4/2.5 1,800 mL/hr at 07/21/2023 0611   vasopressin 0.03 Units/min (08/05/2023 0700)    CBC    Component Value Date/Time   WBC 5.5 07/11/2023 0334   RBC 2.44 (L) 07/11/2023 0334   HGB 9.5 (L) 08/02/2023 1517   HGB 9.2 (L) 07/09/2023 1517   HCT 28.0 (L) 07/29/2023 1517   HCT 27.0 (L) 07/29/2023 1517   PLT 151 07/11/2023 0334   MCV 107.8 (H) 07/11/2023 0334   MCH 32.0 07/11/2023 0334   MCHC 29.7 (L) 07/11/2023 0334   RDW 21.5 (H) 07/11/2023 0334   LYMPHSABS 2.2 03/15/2020 0947   MONOABS 0.5 03/15/2020 0947   EOSABS 0.2 03/15/2020 0947   BASOSABS 0.0 03/15/2020 0947   CMP     Component Value Date/Time   NA 134 (L) 07/26/2023 0434   K 4.3 08/05/2023 0434   CL 100 08/04/2023 0434   CO2 27 07/15/2023 0434   GLUCOSE 143 (H) 07/11/2023 0434   BUN 21 (H) 08/05/2023 0434   CREATININE 1.67 (H) 07/30/2023 0434   CALCIUM 10.9 (H)  07/21/2023 0434   CALCIUM 9.8 02/18/2011 1007   PROT 8.1 07/05/2023 0452   ALBUMIN 2.6 (L) 08/01/2023 0434   AST 46 (H) 07/05/2023 0452   ALT 13 07/05/2023 0452   ALKPHOS 78 07/05/2023 0452   BILITOT 1.0 07/05/2023 0452   GFRNONAA 50 (L) 08/04/2023 0434   GFRAA 10 (L) 03/16/2020 0825   ABG    Component Value Date/Time   PHART 7.317 (L) 07/11/2023 1346   PCO2ART 48.2 (H) 07/11/2023 1346   PO2ART 103 07/11/2023 1346   HCO3 25.9 07/22/2023 1517   HCO3 24.6 07/23/2023 1517   TCO2 27 07/09/2023 1517   TCO2 26 08/02/2023 1517   ACIDBASEDEF 1.0 07/16/2023 1517   O2SAT 82 07/13/2023 0434   CBG (last 3)  Recent Labs    07/13/23 1943 07/13/23 2338 08/05/2023 0326  GLUCAP 133* 120* 137*  EXAM Awake Ext Warm Card: rr    ASSESSMENT: SP AVR, MV/TV repair MAZE Will wean  vaso/epi for SBP > 95 For RHC today to evaluate need for fistula closure  Cont CRRT Tolerating tube feeds Starting coumadin   Eugenio Hoes, MD 07/28/2023

## 2023-07-14 NOTE — Progress Notes (Addendum)
remains tenuous. Will follow course post AVF ligation   Length of Stay: [address] PA-C  07/10/2023, 8:17 AM  Advanced Heart Failure Team Pager [phone] (M-F; 7a - 5p)  Please contact [facility] for night-coverage after hours (5p -7a ) and weekends on [email or web address]  Agree with above.  More lethargic today. Remains on multiple pressors and CVHD. Felt worse when iNO was stopped.   General:  Sitting up in bed. Lethargic  No resp difficulty HEENT: normal + cor-trak Neck: supple.RIJ TVP LIJ HD cath Carotids 2+ bilat; no bruits. No lymphadenopathy or thryomegaly appreciated. DGL:OVFIEPP rate & rhythm. No rubs, gallops or murmurs. Lungs: coarse Abdomen: soft, nontender, nondistended. No hepatosplenomegaly. No bruits or masses. Good bowel sounds. Extremities: no cyanosis, clubbing, rash, 1+ edema + RUE AVF Neuro: lethargic but communicative. Moves all 4  Remains very tenuous. Unable to wean pressors much. D/w TCTS, VVS and CCM. Will plan repeat RHC today to assess response to iNO followed by OR for ligation of AVF. Once AVF ligated can consider careful initiation of PDE-5i.   CRITICAL CARE Performed by: Arvilla Meres  Total critical care time: 50 minutes  Critical care time was exclusive of separately billable procedures and treating other patients.  Critical care was necessary to treat or prevent imminent or life-threatening deterioration.  Critical care was time spent personally by me (independent of midlevel providers or residents) on the following activities: development of treatment plan with patient and/or surrogate as well as nursing, discussions with consultants, evaluation of patient's response to treatment, examination of patient, obtaining  history from patient or surrogate, ordering and performing treatments and interventions, ordering and review of laboratory studies, ordering and review of radiographic studies, pulse oximetry and re-evaluation of patient's condition.  Arvilla Meres, MD  9:19 AM  Advanced Heart Failure Rounding Note  PCP-Cardiologist: Reatha Harps, MD   Subjective:   8/15: Elective triple vale surgery + MAZE. Underwent aortic valve replacement + mitral and tricuspid valve repairs by Dr. Milinda Antis 8/22: AHF consulted for high-output HF with persistent post-op shock 8/25: CRRT stopped 8/26: CRRT restarted  8/27: S/p TTVP Echo: EF 70-75%, severe LVH, RV mod reduced, RV severely enlarged. LA/RA mod dilated. Large pericardial effusion. No evidence of tamponade.  9/4: RHC c/w severe PAH with normal output and low SVR. PVR 4-5. SBP improved nearly 20 points with AVF occlusion.   Remains pressor dependent. Did not tolerate VP wean. Added back overnight, currently on 0.02. Continues on NE 10 + Midodrine 20 tid. iNO 20   CO-OX 82%   Remains in CVVHD. Pulling ~65 cc/hr. CVP ~16   Remains on TFs.   Weak/fatigued appearing. Follows commands. No distress.   Objective:   Findings: Resting numbers on multiple pressors Ao = 122/54 (72) RA = 22 RV = 92/24 PA = 98/33 (55) PCW = 28 (v to 37) Fick cardiac output/index = 6.9/3.3 Thermo CO/CI = 7.2/3.4 PVR = 4.2 (Fick) Thermo (3.8) SVR = 560 FA sat = 96% PA sat = 56% PAPI = 3   With AVF occluded Ao = 132/64 (82) RA = 22 PA = 97/33 (55) PCW = 28 (v to 35) Fick cardiac output/index = 5.9/2.8 Thermo CO/CI = 6.0/2.8 PVR = 4.6 (Fick) 4.5 (Thermo) SVR = 800 FA sat = 98% PA sat = 53% PAPI = 3  Assessment: - Complicated hemodynamics. There is severe pulmonary HTN with preserved PAPI. PAH seems to be multifactorial due to high out, increased PVR and elevated left-sided pressures.  - The MAP increased 10 points with occlusion of AVF with increase in SVR and decrease in CO.  - Will attempt to address pulmonary pressures with iNO and low-dose sildenafil. If no benefit will consider ligation of AVF to help facilitate liberation from pressor support.   Weight Range: 79 kg Body mass index is 21.77 kg/m.   Vital  Signs:   Temp:  [97.1 F (36.2 C)-98.8 F (37.1 C)] 97.7 F (36.5 C) (09/06 0759) Pulse Rate:  [79-81] 80 (09/06 0700) Resp:  [20-37] 33 (09/06 0700) BP: (79-126)/(17-43) 126/43 (09/05 2342) SpO2:  [97 %-100 %] 100 % (09/06 0700) Arterial Line BP: (102-131)/(38-53) 125/44 (09/06 0700) Weight:  [79 kg] 79 kg (09/06 0453) Last BM Date : 07/13/23  Weight change: Filed Weights   07/13/2023 0500 07/13/23 0451 08/04/2023 0453  Weight: 82.8 kg 81.1 kg 79 kg   Intake/Output:   Intake/Output Summary (Last 24 hours) at 07/20/2023 0817 Last data filed at 08/05/2023 0800 Gross per 24 hour  Intake 3674.56 ml  Output 4958.6 ml  Net -1284.04 ml    Physical Exam   CVP 16  General:  fatigued/ weak appearing. No respiratory difficulty. NAD  HEENT: normal. +cortak Neck: supple. JVD elevated to jaw Carotids 2+ bilat; no bruits. No lymphadenopathy or thyromegaly appreciated.  + Rt internal jugular CVC, +lt internal jugular HD cath  Cor: PMI nondisplaced. Regular rate & rhythm. No rubs, gallops or murmurs. Lungs: decreased BS at the bases  Abdomen: soft, nontender, nondistended. No hepatosplenomegaly. No bruits or masses. Good bowel sounds. Extremities: no cyanosis, clubbing, rash, edema. Fistula RUE +bruit/thrill.  Neuro: alert & oriented x 3, cranial nerves grossly intact. moves all 4 extremities w/o difficulty. Affect flat   Telemetry   V paced 80s. Personally reviewed  Labs  Advanced Heart Failure Rounding Note  PCP-Cardiologist: Reatha Harps, MD   Subjective:   8/15: Elective triple vale surgery + MAZE. Underwent aortic valve replacement + mitral and tricuspid valve repairs by Dr. Milinda Antis 8/22: AHF consulted for high-output HF with persistent post-op shock 8/25: CRRT stopped 8/26: CRRT restarted  8/27: S/p TTVP Echo: EF 70-75%, severe LVH, RV mod reduced, RV severely enlarged. LA/RA mod dilated. Large pericardial effusion. No evidence of tamponade.  9/4: RHC c/w severe PAH with normal output and low SVR. PVR 4-5. SBP improved nearly 20 points with AVF occlusion.   Remains pressor dependent. Did not tolerate VP wean. Added back overnight, currently on 0.02. Continues on NE 10 + Midodrine 20 tid. iNO 20   CO-OX 82%   Remains in CVVHD. Pulling ~65 cc/hr. CVP ~16   Remains on TFs.   Weak/fatigued appearing. Follows commands. No distress.   Objective:   Findings: Resting numbers on multiple pressors Ao = 122/54 (72) RA = 22 RV = 92/24 PA = 98/33 (55) PCW = 28 (v to 37) Fick cardiac output/index = 6.9/3.3 Thermo CO/CI = 7.2/3.4 PVR = 4.2 (Fick) Thermo (3.8) SVR = 560 FA sat = 96% PA sat = 56% PAPI = 3   With AVF occluded Ao = 132/64 (82) RA = 22 PA = 97/33 (55) PCW = 28 (v to 35) Fick cardiac output/index = 5.9/2.8 Thermo CO/CI = 6.0/2.8 PVR = 4.6 (Fick) 4.5 (Thermo) SVR = 800 FA sat = 98% PA sat = 53% PAPI = 3  Assessment: - Complicated hemodynamics. There is severe pulmonary HTN with preserved PAPI. PAH seems to be multifactorial due to high out, increased PVR and elevated left-sided pressures.  - The MAP increased 10 points with occlusion of AVF with increase in SVR and decrease in CO.  - Will attempt to address pulmonary pressures with iNO and low-dose sildenafil. If no benefit will consider ligation of AVF to help facilitate liberation from pressor support.   Weight Range: 79 kg Body mass index is 21.77 kg/m.   Vital  Signs:   Temp:  [97.1 F (36.2 C)-98.8 F (37.1 C)] 97.7 F (36.5 C) (09/06 0759) Pulse Rate:  [79-81] 80 (09/06 0700) Resp:  [20-37] 33 (09/06 0700) BP: (79-126)/(17-43) 126/43 (09/05 2342) SpO2:  [97 %-100 %] 100 % (09/06 0700) Arterial Line BP: (102-131)/(38-53) 125/44 (09/06 0700) Weight:  [79 kg] 79 kg (09/06 0453) Last BM Date : 07/13/23  Weight change: Filed Weights   07/13/2023 0500 07/13/23 0451 08/04/2023 0453  Weight: 82.8 kg 81.1 kg 79 kg   Intake/Output:   Intake/Output Summary (Last 24 hours) at 07/20/2023 0817 Last data filed at 08/05/2023 0800 Gross per 24 hour  Intake 3674.56 ml  Output 4958.6 ml  Net -1284.04 ml    Physical Exam   CVP 16  General:  fatigued/ weak appearing. No respiratory difficulty. NAD  HEENT: normal. +cortak Neck: supple. JVD elevated to jaw Carotids 2+ bilat; no bruits. No lymphadenopathy or thyromegaly appreciated.  + Rt internal jugular CVC, +lt internal jugular HD cath  Cor: PMI nondisplaced. Regular rate & rhythm. No rubs, gallops or murmurs. Lungs: decreased BS at the bases  Abdomen: soft, nontender, nondistended. No hepatosplenomegaly. No bruits or masses. Good bowel sounds. Extremities: no cyanosis, clubbing, rash, edema. Fistula RUE +bruit/thrill.  Neuro: alert & oriented x 3, cranial nerves grossly intact. moves all 4 extremities w/o difficulty. Affect flat   Telemetry   V paced 80s. Personally reviewed  Labs  remains tenuous. Will follow course post AVF ligation   Length of Stay: [address] PA-C  07/10/2023, 8:17 AM  Advanced Heart Failure Team Pager [phone] (M-F; 7a - 5p)  Please contact [facility] for night-coverage after hours (5p -7a ) and weekends on [email or web address]  Agree with above.  More lethargic today. Remains on multiple pressors and CVHD. Felt worse when iNO was stopped.   General:  Sitting up in bed. Lethargic  No resp difficulty HEENT: normal + cor-trak Neck: supple.RIJ TVP LIJ HD cath Carotids 2+ bilat; no bruits. No lymphadenopathy or thryomegaly appreciated. DGL:OVFIEPP rate & rhythm. No rubs, gallops or murmurs. Lungs: coarse Abdomen: soft, nontender, nondistended. No hepatosplenomegaly. No bruits or masses. Good bowel sounds. Extremities: no cyanosis, clubbing, rash, 1+ edema + RUE AVF Neuro: lethargic but communicative. Moves all 4  Remains very tenuous. Unable to wean pressors much. D/w TCTS, VVS and CCM. Will plan repeat RHC today to assess response to iNO followed by OR for ligation of AVF. Once AVF ligated can consider careful initiation of PDE-5i.   CRITICAL CARE Performed by: Arvilla Meres  Total critical care time: 50 minutes  Critical care time was exclusive of separately billable procedures and treating other patients.  Critical care was necessary to treat or prevent imminent or life-threatening deterioration.  Critical care was time spent personally by me (independent of midlevel providers or residents) on the following activities: development of treatment plan with patient and/or surrogate as well as nursing, discussions with consultants, evaluation of patient's response to treatment, examination of patient, obtaining  history from patient or surrogate, ordering and performing treatments and interventions, ordering and review of laboratory studies, ordering and review of radiographic studies, pulse oximetry and re-evaluation of patient's condition.  Arvilla Meres, MD  9:19 AM

## 2023-07-14 NOTE — Interval H&P Note (Signed)
History and Physical Interval Note:  07/30/2023 10:28 AM  [name]  has presented today for surgery, with the diagnosis of chf.  The various methods of treatment have been discussed with the patient and family. After consideration of risks, benefits and other options for treatment, the patient has consented to  Procedure(s): RIGHT HEART CATH (N/A) as a surgical intervention.  The patient's history has been reviewed, patient examined, no change in status, stable for surgery.  I have reviewed the patient's chart and labs.  Questions were answered to the patient's satisfaction.     Parry Po

## 2023-07-15 DIAGNOSIS — R579 Shock, unspecified: Secondary | ICD-10-CM | POA: Diagnosis not present

## 2023-07-15 DIAGNOSIS — Z9889 Other specified postprocedural states: Secondary | ICD-10-CM | POA: Diagnosis not present

## 2023-07-15 DIAGNOSIS — I5023 Acute on chronic systolic (congestive) heart failure: Secondary | ICD-10-CM | POA: Diagnosis not present

## 2023-07-15 DIAGNOSIS — I50810 Right heart failure, unspecified: Secondary | ICD-10-CM | POA: Diagnosis not present

## 2023-07-15 DIAGNOSIS — N186 End stage renal disease: Secondary | ICD-10-CM | POA: Diagnosis not present

## 2023-07-15 DIAGNOSIS — R570 Cardiogenic shock: Secondary | ICD-10-CM | POA: Diagnosis not present

## 2023-07-15 LAB — RENAL FUNCTION PANEL
Albumin: 2.4 g/dL — ABNORMAL LOW (ref 3.5–5.0)
Albumin: 2.5 g/dL — ABNORMAL LOW (ref 3.5–5.0)
Anion gap: 7 (ref 5–15)
Anion gap: 10 (ref 5–15)
BUN: 22 mg/dL — ABNORMAL HIGH (ref 6–20)
BUN: 21 mg/dL — ABNORMAL HIGH (ref 6–20)
CO2: 24 mmol/L (ref 22–32)
CO2: 26 mmol/L (ref 22–32)
Calcium: 10.4 mg/dL — ABNORMAL HIGH (ref 8.9–10.3)
Calcium: 10.2 mg/dL (ref 8.9–10.3)
Chloride: 98 mmol/L (ref 98–111)
Chloride: 98 mmol/L (ref 98–111)
Creatinine, Ser: 1.76 mg/dL — ABNORMAL HIGH (ref 0.61–1.24)
Creatinine, Ser: 1.66 mg/dL — ABNORMAL HIGH (ref 0.61–1.24)
GFR, Estimated: 47 mL/min — ABNORMAL LOW (ref >60)
GFR, Estimated: 50 mL/min — ABNORMAL LOW (ref >60)
Glucose, Bld: 155 mg/dL — ABNORMAL HIGH (ref 70–99)
Glucose, Bld: 121 mg/dL — ABNORMAL HIGH (ref 70–99)
Phosphorus: 2.4 mg/dL — ABNORMAL LOW (ref 2.5–4.6)
Phosphorus: 2.8 mg/dL (ref 2.5–4.6)
Potassium: 4.7 mmol/L (ref 3.5–5.1)
Potassium: 4.8 mmol/L (ref 3.5–5.1)
Sodium: 129 mmol/L — ABNORMAL LOW (ref 135–145)
Sodium: 134 mmol/L — ABNORMAL LOW (ref 135–145)

## 2023-07-15 LAB — GLUCOSE, CAPILLARY
Glucose-Capillary: 100 mg/dL — ABNORMAL HIGH (ref 70–99)
Glucose-Capillary: 111 mg/dL — ABNORMAL HIGH (ref 70–99)
Glucose-Capillary: 122 mg/dL — ABNORMAL HIGH (ref 70–99)
Glucose-Capillary: 133 mg/dL — ABNORMAL HIGH (ref 70–99)
Glucose-Capillary: 142 mg/dL — ABNORMAL HIGH (ref 70–99)
Glucose-Capillary: 97 mg/dL (ref 70–99)

## 2023-07-15 LAB — MAGNESIUM: Magnesium: 2.6 mg/dL — ABNORMAL HIGH (ref 1.7–2.4)

## 2023-07-15 LAB — COOXEMETRY PANEL
Carboxyhemoglobin: 1.7 % — ABNORMAL HIGH (ref 0.5–1.5)
Methemoglobin: <0.7 % (ref 0.0–1.5)
O2 Saturation: 82 %
Total hemoglobin: 8.3 g/dL — ABNORMAL LOW (ref 12.0–16.0)

## 2023-07-15 MED ORDER — METOCLOPRAMIDE HCL 10 MG PO TABS
5.0000 mg | ORAL_TABLET | Freq: Two times a day (BID) | ORAL | Status: DC
  Administered 2023-07-16: 5 mg
  Filled 2023-07-15: qty 1

## 2023-07-15 NOTE — Progress Notes (Signed)
[address]       [place] [address]             [phone]                 1 Day Post-Op Procedure(s) (LRB): LIGATION OF RIGHT ARTERIOVENOUS  FISTULA (Right)   Events: No events _______________________________________________________________ Vitals: BP (!) 108/44   Pulse 82   Temp (!) 97.2 F (36.2 C) (Axillary)   Resp 20   Ht 6\' 3"  (1.905 m)   Wt 75.8 kg   SpO2 100%   BMI 20.89 kg/m  Filed Weights   07/13/23 0451 07/17/2023 0453 07/15/23 0500  Weight: 81.1 kg 79 kg 75.8 kg     - Neuro: alert NAD  - Cardiovascular: paced  Drips: epi 7.  Vaso 0.03.    iNO PAP: (80)/(30) 80/30 CVP:  [12 mmHg-24 mmHg] 14 mmHg CO:  [7.4 L/min] 7.4 L/min CI:  [3.2 L/min/m2] 3.2 L/min/m2  - Pulm: EWOB    ABG    Component Value Date/Time   PHART 7.317 (L) 07/11/2023 1346   PCO2ART 48.2 (H) 07/11/2023 1346   PO2ART 103 07/11/2023 1346   HCO3 27.9 07/29/2023 1038   HCO3 27.6 08/01/2023 1038   TCO2 29 08/07/2023 1038   TCO2 29 08/03/2023 1038   ACIDBASEDEF 1.0 07/11/2023 1517   O2SAT 82 07/15/2023 0428    - Abd: ND - Extremity: warm  .Intake/Output      09/06 0701 09/07 0700 09/07 0701 09/08 0700   P.O. 240    I.V. (mL/kg) 898.5 (11.9)    Other     NG/GT 1200.8    Total Intake(mL/kg) 2339.3 (30.9)    Stool     CRRT 3186    Total Output 3186    Net -846.7            _______________________________________________________________ Labs:    Latest Ref Rng & Units 07/17/2023   10:38 AM 07/09/2023    3:17 PM 07/26/2023    2:34 PM  CBC  Hemoglobin 13.0 - 17.0 g/dL 91.4 - 78.2 g/dL 9.5    9.9  9.5    9.2  8.8   Hematocrit 39.0 - 52.0 % 39.0 - 52.0 % 28.0    29.0  28.0    27.0  26.0       Latest Ref Rng & Units 07/15/2023    4:28 AM 07/11/2023    3:12 PM 07/21/2023   10:38 AM  CMP  Glucose 70 - 99 mg/dL 956  213    BUN 6 - 20 mg/dL 22  27    Creatinine 0.86 - 1.24 mg/dL 5.78  4.69    Sodium 629 - 145 mmol/L 129  134  137    137    Potassium 3.5 - 5.1 mmol/L 4.7  4.8  4.6    4.6   Chloride 98 - 111 mmol/L 98  101    CO2 22 - 32 mmol/L 24  25    Calcium 8.9 - 10.3 mg/dL 52.8  41.3      CXR: -  _______________________________________________________________  Assessment and Plan: s/p AVR/MVR/TVR.  S/p fistula ligation  Neuro: pain controlled CV: remains paced.  Will need pacer eventually.  Weaning gtts.  Will remain on iNO Pulm: IS Renal: on CRRT GI: on diet Heme: stable ID: [email or web address] Endo: SSI Dispo: continue ICU care   Corliss Skains 07/15/2023 7:43 AM

## 2023-07-15 NOTE — Plan of Care (Signed)
  Problem: Education: Goal: Knowledge of General Education information will improve Description: Including pain rating scale, medication(s)/side effects and non-pharmacologic comfort measures Outcome: Progressing   Problem: Health Behavior/Discharge Planning: Goal: Ability to manage health-related needs will improve Outcome: Progressing   Problem: Clinical Measurements: Goal: Ability to maintain clinical measurements within normal limits will improve Outcome: Progressing Goal: Will remain free from infection Outcome: Progressing Goal: Diagnostic test results will improve Outcome: Progressing Goal: Respiratory complications will improve Outcome: Progressing Goal: Cardiovascular complication will be avoided Outcome: Progressing   Problem: Activity: Goal: Risk for activity intolerance will decrease Outcome: Progressing   Problem: Nutrition: Goal: Adequate nutrition will be maintained Outcome: Progressing   Problem: Coping: Goal: Level of anxiety will decrease Outcome: Progressing   Problem: Elimination: Goal: Will not experience complications related to bowel motility Outcome: Progressing   Problem: Pain Managment: Goal: General experience of comfort will improve Outcome: Progressing   Problem: Safety: Goal: Ability to remain free from injury will improve Outcome: Progressing   Problem: Skin Integrity: Goal: Risk for impaired skin integrity will decrease Outcome: Progressing   Problem: Education: Goal: Knowledge of disease or condition will improve Outcome: Progressing Goal: Knowledge of the prescribed therapeutic regimen will improve Outcome: Progressing   Problem: Activity: Goal: Risk for activity intolerance will decrease Outcome: Progressing   Problem: Cardiac: Goal: Will achieve and/or maintain hemodynamic stability Outcome: Progressing   Problem: Clinical Measurements: Goal: Postoperative complications will be avoided or minimized Outcome:  Progressing   Problem: Respiratory: Goal: Respiratory status will improve Outcome: Progressing   Problem: Skin Integrity: Goal: Wound healing without signs and symptoms of infection Outcome: Progressing Goal: Risk for impaired skin integrity will decrease Outcome: Progressing   Problem: Urinary Elimination: Goal: Ability to achieve and maintain adequate renal perfusion and functioning will improve Outcome: Progressing   Problem: Education: Goal: Understanding of CV disease, CV risk reduction, and recovery process will improve Outcome: Progressing Goal: Individualized Educational Video(s) Outcome: Progressing   Problem: Activity: Goal: Ability to return to baseline activity level will improve Outcome: Progressing   Problem: Cardiovascular: Goal: Ability to achieve and maintain adequate cardiovascular perfusion will improve Outcome: Progressing Goal: Vascular access site(s) Level 0-1 will be maintained Outcome: Progressing   Problem: Health Behavior/Discharge Planning: Goal: Ability to safely manage health-related needs after discharge will improve Outcome: Progressing

## 2023-07-15 NOTE — Progress Notes (Signed)
NAME:  [name], MRN:  [id number], DOB:  12-31-1970, LOS: 23 ADMISSION DATE:  06/14/2023, CONSULTATION DATE: 06/15/2023 REFERRING MD: Leafy Ro - TCTS, CHIEF COMPLAINT: Postcardiac surgery  History of Present Illness:  This is a 52 year old gentleman, past medical history of anxiety depression, bipolar disease, congestive heart failure chronic diastolic heart failure kidney disease followed by renal transplant in October 05, 1998 repeat renal transplant in 2014/10/05.  Patient underwent surgery today for severe mitral regurgitation severe tricuspid regurgitation and moderate aortic stenosis.  He also has atrial fibrillation received maze procedure.  He had a complex mitral valve repair a tricuspid valve annuloplasty and aortic valve replacement followed by full right and left maze procedure.  Pertinent Medical History:   Past Medical History:  Diagnosis Date   ADHD (attention deficit hyperactivity disorder)    Anemia    Anemia, chronic disease 07/13/2011   Anxiety    Arthritis    Bipolar 2 disorder (HCC) 08/18/2011   Diagnosed in 2010/10/05.    Bipolar disorder (HCC)    Blood dyscrasia    Nephrotic Anemia   Blood transfusion    Blood transfusion without reported diagnosis    Chronic diastolic CHF (congestive heart failure) (HCC)    Complication of anesthesia    Woke up intubated gets combative  afraid to be alone    Congestive heart failure (CHF) (HCC) 07/13/2011   Onset October 05, 2005.  Followed by Dr. Eden Emms.  S/p cardiac catheterization in [facility] Dr. Anne Fu..  S/p cardiac catheterization in 2013/10/05 by Nishan.  Echo 2013-10-05.    Coronary artery disease    Depression    Dysrhythmia    A. Fib 10/06/23 with DCCV in May 2024   Erectile dysfunction    ESRD on hemodialysis (HCC) 07/13/2011   Glomerulonephritis at age 64, started HD in 1995/10/06.  Deceased donor renal transplant 10/05/98 at [facility], then kidney failed and went back on HD in 10-05-2005.  L forearm AVF . s/p repeat renal transplant 10/2014 [facility].   GI bleed 11/01/2011   Rectal  bleeding with emesis/diarrhea    Heart murmur    Hemodialysis status (HCC)    HSV (herpes simplex virus) infection    Hydrocele, right 11/25/2014   Hypertension    Hypertensive emergency 01/06/2013   Hypoglycemia 01/11/2013   Influenza-like illness 12/18/2013   Mild ascending aorta dilatation (HCC)    NICM (nonischemic cardiomyopathy) (HCC)    Pericardial effusion    a. Mod by echo in 05-Oct-2013, similar to prior.   Peripheral polyneuropathy 08/04/2016   Pneumonia    Pulmonary edema 01/06/2013   Pulmonary hypertension associated with end stage renal disease on dialysis [facility]) 07/13/2011   Renal transplant, status post 11/25/2014   Pt with Glomerulonephritis at age 71. Deceased donor renal transplant 10/05/98 right and left renal transplant 10/2014.  Followed by [facility] transplant team; nephrologist is Dr. Lonna Cobb.    Respiratory failure with hypoxia (HCC) 01/06/2013   Shortness of breath 12/12/2012   Sleep apnea    Swollen testicle 07/13/2011   URI (upper respiratory infection) 01/25/2012    Significant Hospital Events: Including procedures, antibiotic start and stop dates in addition to other pertinent events   8/15 - AVR, MVR, TVR with TCTS ([place]). Remains on multiple pressors (NE, Epi, vaso). 8/16 - LIJ Trialysis catheter placed for CRRT.  8/17 extubated  8/18 - remains on CVVHD, 3 pressors  8/19-8/20 improving pressor needs 8/21 agitated delirium; cortrak 8/26 CRRT restarted 8/27 TVP placed, brady overnight and pacing turned to 80 8/30 palli consult  9/1 PRBC for low coox  9/2 coox down a bit again  9/3CRRT off overnight due to access issues. Remains on vaso 0.04, epi 10, norepi 2, DBA 5. 9/4 RHC, started iNO 24-Jul-2023 repeat RHC, fistula ligated Interim History / Subjective:  Feeling well today, denies complaints.   Objective:  Blood pressure (!) 108/44, pulse 82, temperature 97.8 F (36.6 C), temperature source Oral, resp. rate (!) 22, height 6\' 3"  (1.905 m), weight 75.8  kg, SpO2 100%. CVP:  [8 mmHg-20 mmHg] 17 mmHg      Intake/Output Summary (Last 24 hours) at 07/15/2023 1719 Last data filed at 07/15/2023 1700 Gross per 24 hour  Intake 2970.55 ml  Output 4222 ml  Net -1251.45 ml   Filed Weights   07/13/23 0451 July 24, 2023 0453 07/15/23 0500  Weight: 81.1 kg 79 kg 75.8 kg   Physical Examination:    General: chronically ill appearing man lying in bed on CRRT sleeping, fatigued appearing  HEENT: temporal wasting, eyes anicteric CV: S1S2, RRR Pulm: breathing comfortably on [place], CTAB GI: soft, NT Derm: warm, dry, no rashes, no erythema around lines MSK: minimal muscle mass, no peripheral edema  Neuro: awake, alert, moving extremities. Wakes up easily to verbal stimulation.   BUN 21, Cr1.67 on CRRT Coox 82%   Resolved issues:  Possible PNA. Right pleural effusion - s/p pigtail chest tube with subsequent removal 8/25.   Assessment & Plan:   ICU delirium, acute encephalopathy - improving.  -BiPAP PRN -delirium precautions; trying to limit sedating meds as able -mobility unfortunately not possible with CRRT need -con't lexapro and seroquel QHS  Acute respiratory failure with hypoxia & hypercapnia -can wean iNO starting tomorrow -supplemental O2 to maintain SpO2 >90% -bipap PRN -pulmonary hygiene -CRRT for volume management  MVR, TVR, AS s/p bioprosthetic replacement CHB/ post op junctional rhythm s/p TVP (8/27) Cardiogenic shock, RV failure, high output heart failure Enlarging pericardial effusion w/o tamponade AoC HFrEF> currently EF recovered Afib s/p MAZE  -post op distributive/vasoplegia, improved. Possible sepsis now off abx. Has RV failure. Pericardial effusion without tamponade physiology P -appreciate TCTS management -with ligation of fistula will hopefully be able to wean inotropes and pressors -appreciate AHF& EP's management; anticipate he will needs PPM -con't high dose midodrine -wean pressors for goal SBP >95%  ESRD; CKD  s/p failed renal transplants x 2 (1999, 2015)  Hypophosphatemia Hyponatremia  Hypokalemia  Hypercalcemia  Hypermagnesemia  History of kidney disease status post renal transplant (failed 1999 and 2015). P -CRRT until able to tolerate iHD -fistula ligated; will need permacath prior to discharge -strict I/O -renally dose meds, avoid nephrotoxic meds  H/o HSV -con't PTA acyclovir  Anemia - expected ABLA (resolved), critical illness, chronic dz  -monitor periodically; transfuse for Hb <7 or hemodynamically significant bleednig -aranesp per nephrology  Moderate malnutrition  -con't TF, can have ad lib PO intake -start slowly weaning reglan; change from TID to BID  Possible gout flare -colchicine  Physical deconditioning  -needs aggressive PT, OT, SLP; most likely needs CIR  GOC -Family had been updated 9/3. Full aggressive care desired.     Best Practice (right click and "Reselect all SmartList Selections" daily)   Diet/type:TF - on hold for OR this morning DVT prophylaxis:heparin, coumadin GI prophylaxis: PPI Lines: Central line, Dialysis Catheter, and Arterial Line Foley:  No  Code Status:  full code Last date of multidisciplinary goals of care discussion [Per Primary Team]  This patient is critically ill with multiple organ system failure which requires  frequent high complexity decision making, assessment, support, evaluation, and titration of therapies. This was completed through the application of advanced monitoring technologies and extensive interpretation of multiple databases. During this encounter critical care time was devoted to patient care services described in this note for 33 minutes.  Steffanie Dunn, DO 07/15/23 5:33 PM [facility]  For contact information, see Amion. If no response to pager, please call PCCM consult pager. After hours, 7PM- 7AM, please call Elink.

## 2023-07-15 NOTE — Progress Notes (Addendum)
Physical Therapy Treatment Patient Details Name: [name] MRN: [id number] DOB: Oct 04, 1971 Today's Date: 07/15/2023   History of Present Illness Pt is a 52 y.o. male admitted 07/08/2023 for same day planned tricuspid valve repair, mitral valve repair, aortic valve replacement, maze, TEE. Post-op course complicated by CHB, shock, AKI, respiratory failure. CRRT initiated 8/16, then resumed 8/26. ETT 8/15-8/17. RHC 07/25/2023 and 9//6.  PMH includes CHF, HTN, NICM, polyneuropathy, ESRD on HD (s/p renal transplant 1999, 2015), bipolar, ADHD, anxiety, depression.    PT Comments  Pt greeted resting in bed, as pt continues to remain on bedrest, however pt eager for mobility and bed level exercises. Pt able to perform UE/LE therex for strength maintenance with cues for technique and intermittent hands on assist PRN for increased ROM. Encouraged pt to perform exercises throughout day between therapies as tolerated. Pt continues to benefit from skilled PT services to progress toward functional mobility goals.     If plan is discharge home, recommend the following: A lot of help with walking and/or transfers;A lot of help with bathing/dressing/bathroom;Assistance with cooking/housework;Assist for transportation;Help with stairs or ramp for entrance   Can travel by private vehicle        Equipment Recommendations   (TBD)    Recommendations for Other Services       Precautions / Restrictions Precautions Precautions: Fall;Sternal Precaution Booklet Issued: No Precaution Comments: CRRT, external pacer in neck Restrictions Weight Bearing Restrictions: Yes Other Position/Activity Restrictions: sternal prec     Mobility  Bed Mobility Overal bed mobility: Needs Assistance             General bed mobility comments: limited to bed level exercises as pt remains on bed rest.    Transfers                        Ambulation/Gait                   Stairs             Wheelchair  Mobility     Tilt Bed    Modified Rankin (Stroke Patients Only)       Balance                                            Cognition Arousal: Alert Behavior During Therapy: Flat affect Overall Cognitive Status: Within Functional Limits for tasks assessed                                 General Comments: pt eager to mobilize, able to follow commands, no verbalizations throughout sessin but communicating with gestures        Exercises General Exercises - Upper Extremity Shoulder Flexion: Supine, Both, 10 reps, AROM (limited, performed below 90* flexion) Shoulder ABduction: AROM, Right, Left, 10 reps, Supine Elbow Flexion: Supine, Both, 10 reps, AROM Elbow Extension: AROM, Both, 10 reps, Supine Digit Composite Flexion: AROM, Both, 10 reps, Supine Composite Extension: AROM, Both, 10 reps, Supine General Exercises - Lower Extremity Ankle Circles/Pumps: AROM, 10 reps, Both, Supine Quad Sets: AROM, Both, 10 reps, Supine Short Arc Quad: AROM, Both, Supine, 10 reps Heel Slides: Both, AROM, 10 reps, Supine Straight Leg Raises: AAROM, AROM, Both, 10 reps, Supine Hip Flexion/Marching: AAROM, Both, 10 reps, Supine Heel Raises: AROM,  Both, 15 reps, Supine (pushing off foot board) Mini-Sqauts: AROM, Both, 15 reps, Supine (pushing off foot board)    General Comments        Pertinent Vitals/Pain Pain Assessment Pain Assessment: Faces Faces Pain Scale: Hurts a little bit Pain Location: sternum with ROM of UEs Pain Descriptors / Indicators: Discomfort, Grimacing Pain Intervention(s): Monitored during session, Limited activity within patient's tolerance    Home Living                          Prior Function            PT Goals (current goals can now be found in the care plan section) Acute Rehab PT Goals Patient Stated Goal: regain independence PT Goal Formulation: With patient Time For Goal Achievement: 07/26/23 Progress towards  PT goals: Not progressing toward goals - comment (continued bedrest)    Frequency    Min 1X/week      PT Plan      Co-evaluation              AM-PAC PT "6 Clicks" Mobility   Outcome Measure  Help needed turning from your back to your side while in a flat bed without using bedrails?: Total Help needed moving from lying on your back to sitting on the side of a flat bed without using bedrails?: Total Help needed moving to and from a bed to a chair (including a wheelchair)?: Total Help needed standing up from a chair using your arms (e.g., wheelchair or bedside chair)?: Total Help needed to walk in hospital room?: Total Help needed climbing 3-5 steps with a railing? : Total 6 Click Score: 6    End of Session   Activity Tolerance: Patient limited by fatigue Patient left: in bed;with call bell/phone within reach;with bed alarm set;with family/visitor present Nurse Communication: Mobility status PT Visit Diagnosis: Other abnormalities of gait and mobility (R26.89);Muscle weakness (generalized) (M62.81)     Time: 5409-8119 PT Time Calculation (min) (ACUTE ONLY): 23 min  Charges:    $Therapeutic Exercise: 23-37 mins PT General Charges $$ ACUTE PT VISIT: 1 Visit                     Lendon George R. PTA Acute Rehabilitation Services Office: [phone]   Catalina Antigua 07/15/2023, 2:29 PM

## 2023-07-15 NOTE — Progress Notes (Signed)
[facility] Progress Note  Subjective: AVF ligated yesterday by Dr Edilia Bo. Pressors have gone down since ligation (epi gtt esp.).   Vitals:   07/15/23 1556 07/15/23 1600 07/15/23 1645 07/15/23 1700  BP:      Pulse:  82 82 82  Resp:  (!) 23 (!) 26 (!) 22  Temp:      TempSrc:      SpO2: 100% 100% 100% 100%  Weight:      Height:        Physical Exam:       General adult male in bed on [place] O2 Lungs clear but reduced to auscultation bilaterally  Heart S1S2 Abdomen soft nontender nondistended Extremities no edema appreciated Neuro - resting, somnolent, awakens easily Access RUE AVF with bruit and thrill; left internal jugular nontunneled dialysis catheter    OP HD: [facility] triad MWF 3rd shift 3h    F250     94kg   2K/2.5 Ca bath   RUE AVF    Summary: [name] is a/an 52 y.o. 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 Underwent aortic biovalve replacement, TV repair, MV repari, bilat Maze procedures.    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 SP AVF ligation 9/06 by VVS. Weaning epi gtt down.   # ESRD - pt was too hypotensive for hemodialysis post-op.  CRRT started 8/16 and remains on CRRT.   # HD access - temp cath clotted overnight. TPA worked and temp cath is back working again   [employer] shock - weaning epi gtt, cont vaso gtt. Off levo and dobutamine. Cont midodrine 30 tid.   # pHTN - severe by RHC --> is now on inhaled NO.   # Volume - improved, wt's stable. Targeting CVP 12-13 due to severe RV failure per CHF team. Keeping even today.   # Pericardial effusion - per CT surgery and CHF.  We are optimizing volume status with CRRT as  above.  Assessed as moderate on last TTE   # Anemia of ESRD - transfuse as needed per primary team. Concurrent thrombocytopenia improved.  Aranesp increased to 150 mcg weekly on Tuesdays    # MBD ckd -  Phos acceptable.  No binders currently as on CRRT - normally on renvela.  Ordering PTH. See below for Ca++ issues.   # Hypercalcemia - remains mild w/ corr Ca++ 11.7 today. Suspect due to immobility. Usually mild-mod hypercalcemia does not require treatment. Will follow.    # Vascular access - RUE AVF had been functioning well but have been unable to use due to shock and CRRT requirement.  Temporary dialysis catheter in place for CRRT.  [name]  MD  [facility] 07/15/2023, 5:30 PM  Recent Labs  Lab 07/15/2023 1517 08/04/2023 1649 07/30/2023 1038 07/13/2023 1512 07/15/23 0428 07/15/23 1527  HGB 9.2*  9.5*  --  9.9*  9.5*  --   --   --   ALBUMIN  --    < >  --    < > 2.4* 2.5*  CALCIUM  --    < >  --    < > 10.4*  10.2  PHOS  --    < >  --    < > 2.4* 2.8  CREATININE  --    < >  --    < > 1.76* 1.66*  K 4.4  4.8   < > 4.6  4.6   < > 4.7 4.8   < > = values in this interval not displayed.    Inpatient medications:  acyclovir  200 mg Per Tube BID   aspirin  81 mg Per Tube Daily   atorvastatin  10 mg Per Tube QHS   azelastine  2 spray Each Nare BID   Chlorhexidine Gluconate Cloth  6 each Topical Daily   cinacalcet  30 mg Oral Q supper   colchicine  0.3 mg Per Tube Once every 5 days   darbepoetin (ARANESP) injection - DIALYSIS  150 mcg Subcutaneous Q Tue-1800   docusate  100 mg Per Tube BID   escitalopram  10 mg Per Tube Daily   folic acid  1 mg Per Tube Daily   guaiFENesin  15 mL Per Tube BID   heparin injection (subcutaneous)  5,000 Units Subcutaneous Q8H   [START ON 07/20/2023] influenza vac split trivalent PF  0.5 mL Intramuscular Once   insulin aspart  0-24 Units Subcutaneous Q4H   lidocaine  1 patch Transdermal Q24H   lidocaine-prilocaine  1 Application Topical Q M,W,F    metoCLOPramide  5 mg Per Tube TID   midodrine  30 mg Per Tube TID   multivitamin  1 tablet Per Tube BID   mouth rinse  15 mL Mouth Rinse 4 times per day   pantoprazole (PROTONIX) IV  40 mg Intravenous Q12H   polyethylene glycol  17 g Per Tube Daily   QUEtiapine  12.5 mg Per Tube QHS   sodium chloride flush  3 mL Intravenous Q12H   thiamine  100 mg Oral Daily     prismasol BGK 4/2.5 400 mL/hr at 07/15/23 1229    prismasol BGK 4/2.5 400 mL/hr at 07/15/23 1709   sodium chloride 10 mL/hr at 07/15/23 1700   sodium chloride 10 mL/hr at 07/18/2023 1400   albumin human Stopped (06/23/23 0040)   epinephrine 3 mcg/min (07/15/23 1700)   feeding supplement (PIVOT 1.5 CAL) 70 mL/hr at 07/15/23 1700   norepinephrine (LEVOPHED) Adult infusion Stopped (07/13/23 0720)   prismasol BGK 4/2.5 1,800 mL/hr at 07/15/23 1609   vasopressin 0.04 Units/min (07/15/23 1700)   sodium chloride, sodium chloride, acetaminophen, albumin human, bisacodyl **OR** bisacodyl, clonazepam, heparin, ondansetron (ZOFRAN) IV, mouth rinse, oxyCODONE, phenol, simethicone, sodium chloride flush

## 2023-07-15 NOTE — Progress Notes (Signed)
IV team consult was placed to assess occluded white proximal lumen of  R TL subclavian CVC.  White lumen is completely occluded, will not be able to instill TPA.  Primary RN has been made aware.

## 2023-07-15 NOTE — Progress Notes (Signed)
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 EF 55-60% w/ mild RV dysfunction by echo in 6/24.    Admitted for elective triple vale surgery + MAZE. S/p bioprosthetic aortic valve replacement + mitral and tricuspid valve repairs. Post-op course c/b persistent vasoplagic syndrome.   Assessment/Plan   1. Post-op Vasopalegia/high-output HF - Echo 06/29/23 EF 60-65%, no RWMA, small pericardial effusion present, MV repaired/replaced, trivial MR. TR repaired/replaced, mild TR. AV repaired/replaced.  - Echo 06/16/2023: EF 70-75%, severe concentric LVH, RV mod reduced, RV severely enlarged. LA/RA mod dilated. Large pericardial effusion. No evidence of tamponade - Echo 08/29: Moderate pericardial effusion, no tamponade - RHC c/w high output HF physiology 2/2 AV fistula despite severe RV dysfunction  - s/p AVF ligation on 9/6 - Continue to wean pressors for SBP 100 - CVP 12. Targeting CVP 12-13 d/t severe RV failure. Keep even on CRRT today   2.  Acute on Chronic Systolic Heart Failure w/ Prominent RV Dysfunction  - LVEF once 45-50% in 2013 but normal on subsequent echos. Pre-op TTE 6/25 EF 55-60%, GIIIDD (Restrictive) RV mildly reduced, mod PH w/ estimated RVSP 48 mmHg  - Intra-op TEE (while on 4 mcg of Epi) LVEF 45%, RV mod-severely reduced, stable AV prosthesis w/ normal gradient and stable valvuloplasty rings w/ mod residual MS, mean  gradient 7 mmgH.   - echo 06/25/2023: EF 70-75%, severe concentric LVH, RV mod reduced, RV severely enlarged. LA/RA mod dilated. Large pericardial effusion. No evidence of tamponade.  - Echo 08/29: EF 65-70%, RV moderately reduced, pericardial effusion slightly smaller, no tamponade - RHC c/w high output HF physiology with very high pulmonary pressures 2/2 AV fistula despite severe RV dysfunction  - Cont Midodrine 30 mg TID - Wean epi as tolerated today for SBP >= 100  Continue midodrine and VP  3. 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 Post-op CHB  - EP following. - S/p RIJ temp wire 08/27 - EPW left in place d/t concern for risk of tamponade - V paced 80. Underneath junctional in 30s today   5. 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 Nephrology following.  - Plan as above   6. H/o Atrial Fibrillation  - S/p MAZE    7. Large pericardial effusion - seen on echo 06/27/2023. Previously small on echo 06/29/23 - no evidence of tamponade - Limited echo 08/29 stable  8. Severe PAH - pre-op PA pressures 58/30. With PVR 1.2 CO 10L  - post-op PA 97/33 (55) PCWP 30 PVR 4.2 CO 7L - suspect combination of valvular heart disease and high-output physiology - should improved with AVF ligation  9. GOC - palliative care initially following. Now signed off 9/2 per patient request - remains full code  CRITICAL CARE Performed by: Arvilla Meres  Total critical care time: 45 minutes  Critical care time was exclusive of separately billable procedures and treating other patients.  Critical care was necessary to treat or prevent imminent or life-threatening deterioration.  Critical care was time spent personally by me (independent of midlevel providers or  residents) on the following activities: development of treatment plan with patient and/or surrogate  8760 hours.  D-Dimer No results for input(s): "DDIMER" in the last 72 hours. Hemoglobin A1C No results for input(s): "HGBA1C" in the last 72 hours. Fasting Lipid Panel No results for input(s): "CHOL", "HDL", "LDLCALC", "TRIG", "CHOLHDL", "LDLDIRECT" in the last 72 hours. Thyroid Function Tests No results for input(s): "TSH", "T4TOTAL", "T3FREE", "THYROIDAB" in the last 72 hours.  Invalid input(s): "FREET3"  Other results:  Imaging   CARDIAC CATHETERIZATION  Result Date: 07/23/2023 Findings: On epi 10, iNO @ 20  and midodrine 20 tid RA = 17 RV = 81/21 PA = 84/27 (45) PCW = 26 TD cardiac output/index = 7.5/3.6 Fick CO/CI = 6.8/3.3 PVR = 2.8 (Fick) 2.5 (TD) SVR = 421 (Fick), 385 (TD) FA sat =99% PA sat = 61%, 61% Assessment: 1. Moderate  to severe PAH with 10 point drop with iNO with now normal PVR 2. Elevated left-sided filling pressures 3. Normal to high cardiac output 4. Profoundly low SVR despite Epi and midodrine Plan/Discussion: Restart VP. Ligate AVF. Arvilla Meres, MD 10:59 AM   Medications:     Scheduled Medications:  acyclovir  200 mg Per Tube BID   aspirin  81 mg Per Tube Daily   atorvastatin  10 mg Per Tube QHS   azelastine  2 spray Each Nare BID   Chlorhexidine Gluconate Cloth  6 each Topical Daily   cinacalcet  30 mg Oral Q supper   colchicine  0.3 mg Per Tube Once every 5 days   darbepoetin (ARANESP) injection - DIALYSIS  150 mcg Subcutaneous Q Tue-1800   docusate  100 mg Per Tube BID   escitalopram  10 mg Per Tube Daily   folic acid  1 mg Per Tube Daily   guaiFENesin  15 mL Per Tube BID   heparin injection (subcutaneous)  5,000 Units Subcutaneous Q8H   [START ON 07/20/2023] influenza vac split trivalent PF  0.5 mL Intramuscular Once   insulin aspart  0-24 Units Subcutaneous Q4H   lidocaine  1 patch Transdermal Q24H   lidocaine-prilocaine  1 Application Topical Q M,W,F   metoCLOPramide  5 mg Per Tube TID   midodrine  30 mg Per Tube TID   multivitamin  1 tablet Per Tube BID   mouth rinse  15 mL Mouth Rinse 4 times per day   pantoprazole (PROTONIX) IV  40 mg Intravenous Q12H   polyethylene glycol  17 g Per Tube Daily   QUEtiapine  12.5 mg Per Tube QHS   sodium chloride flush  3 mL Intravenous Q12H   thiamine  100 mg Oral Daily    Infusions:   prismasol BGK 4/2.5 400 mL/hr at 07/28/2023 0425    prismasol BGK 4/2.5 400 mL/hr at 07/15/23 0435   sodium chloride 10 mL/hr at 07/15/23 1000   sodium chloride 10 mL/hr at 07/17/2023 1400   albumin human Stopped (06/23/23 0040)   epinephrine 4 mcg/min (07/15/23 1000)   feeding supplement (PIVOT 1.5 CAL) 70 mL/hr at 07/15/23 1000   norepinephrine (LEVOPHED) Adult infusion Stopped (07/13/23 0720)   prismasol BGK 4/2.5 1,800 mL/hr at 07/15/23 0800    vasopressin 0.04 Units/min (07/15/23 1000)    PRN Medications: sodium chloride, sodium chloride, acetaminophen, albumin human, bisacodyl **OR** bisacodyl, clonazepam, heparin, ondansetron (ZOFRAN) IV, mouth rinse, oxyCODONE, phenol, simethicone, sodium chloride flush  Patient Profile   52 y/o AAM w/ mildly reduced systolic heart failure (EF once 45-50% in 2013 but normal on subsequent echos) valvular heart disease including severe AS, severe MR due to MVP and  severe TR, atrial fibrillation, pulmonary hypertension, 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 EF 55-60% w/ mild RV dysfunction by echo in 6/24.    Admitted for elective triple vale surgery + MAZE. S/p bioprosthetic aortic valve replacement + mitral and tricuspid valve repairs. Post-op course c/b persistent vasoplagic syndrome.   Assessment/Plan   1. Post-op Vasopalegia/high-output HF - Echo 06/29/23 EF 60-65%, no RWMA, small pericardial effusion present, MV repaired/replaced, trivial MR. TR repaired/replaced, mild TR. AV repaired/replaced.  - Echo 06/16/2023: EF 70-75%, severe concentric LVH, RV mod reduced, RV severely enlarged. LA/RA mod dilated. Large pericardial effusion. No evidence of tamponade - Echo 08/29: Moderate pericardial effusion, no tamponade - RHC c/w high output HF physiology 2/2 AV fistula despite severe RV dysfunction  - s/p AVF ligation on 9/6 - Continue to wean pressors for SBP 100 - CVP 12. Targeting CVP 12-13 d/t severe RV failure. Keep even on CRRT today   2.  Acute on Chronic Systolic Heart Failure w/ Prominent RV Dysfunction  - LVEF once 45-50% in 2013 but normal on subsequent echos. Pre-op TTE 6/25 EF 55-60%, GIIIDD (Restrictive) RV mildly reduced, mod PH w/ estimated RVSP 48 mmHg  - Intra-op TEE (while on 4 mcg of Epi) LVEF 45%, RV mod-severely reduced, stable AV prosthesis w/ normal gradient and stable valvuloplasty rings w/ mod residual MS, mean  gradient 7 mmgH.   - echo 06/25/2023: EF 70-75%, severe concentric LVH, RV mod reduced, RV severely enlarged. LA/RA mod dilated. Large pericardial effusion. No evidence of tamponade.  - Echo 08/29: EF 65-70%, RV moderately reduced, pericardial effusion slightly smaller, no tamponade - RHC c/w high output HF physiology with very high pulmonary pressures 2/2 AV fistula despite severe RV dysfunction  - Cont Midodrine 30 mg TID - Wean epi as tolerated today for SBP >= 100  Continue midodrine and VP  3. 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 Post-op CHB  - EP following. - S/p RIJ temp wire 08/27 - EPW left in place d/t concern for risk of tamponade - V paced 80. Underneath junctional in 30s today   5. 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 Nephrology following.  - Plan as above   6. H/o Atrial Fibrillation  - S/p MAZE    7. Large pericardial effusion - seen on echo 06/27/2023. Previously small on echo 06/29/23 - no evidence of tamponade - Limited echo 08/29 stable  8. Severe PAH - pre-op PA pressures 58/30. With PVR 1.2 CO 10L  - post-op PA 97/33 (55) PCWP 30 PVR 4.2 CO 7L - suspect combination of valvular heart disease and high-output physiology - should improved with AVF ligation  9. GOC - palliative care initially following. Now signed off 9/2 per patient request - remains full code  CRITICAL CARE Performed by: Arvilla Meres  Total critical care time: 45 minutes  Critical care time was exclusive of separately billable procedures and treating other patients.  Critical care was necessary to treat or prevent imminent or life-threatening deterioration.  Critical care was time spent personally by me (independent of midlevel providers or  residents) on the following activities: development of treatment plan with patient and/or surrogate  8760 hours.  D-Dimer No results for input(s): "DDIMER" in the last 72 hours. Hemoglobin A1C No results for input(s): "HGBA1C" in the last 72 hours. Fasting Lipid Panel No results for input(s): "CHOL", "HDL", "LDLCALC", "TRIG", "CHOLHDL", "LDLDIRECT" in the last 72 hours. Thyroid Function Tests No results for input(s): "TSH", "T4TOTAL", "T3FREE", "THYROIDAB" in the last 72 hours.  Invalid input(s): "FREET3"  Other results:  Imaging   CARDIAC CATHETERIZATION  Result Date: 07/23/2023 Findings: On epi 10, iNO @ 20  and midodrine 20 tid RA = 17 RV = 81/21 PA = 84/27 (45) PCW = 26 TD cardiac output/index = 7.5/3.6 Fick CO/CI = 6.8/3.3 PVR = 2.8 (Fick) 2.5 (TD) SVR = 421 (Fick), 385 (TD) FA sat =99% PA sat = 61%, 61% Assessment: 1. Moderate  to severe PAH with 10 point drop with iNO with now normal PVR 2. Elevated left-sided filling pressures 3. Normal to high cardiac output 4. Profoundly low SVR despite Epi and midodrine Plan/Discussion: Restart VP. Ligate AVF. Arvilla Meres, MD 10:59 AM   Medications:     Scheduled Medications:  acyclovir  200 mg Per Tube BID   aspirin  81 mg Per Tube Daily   atorvastatin  10 mg Per Tube QHS   azelastine  2 spray Each Nare BID   Chlorhexidine Gluconate Cloth  6 each Topical Daily   cinacalcet  30 mg Oral Q supper   colchicine  0.3 mg Per Tube Once every 5 days   darbepoetin (ARANESP) injection - DIALYSIS  150 mcg Subcutaneous Q Tue-1800   docusate  100 mg Per Tube BID   escitalopram  10 mg Per Tube Daily   folic acid  1 mg Per Tube Daily   guaiFENesin  15 mL Per Tube BID   heparin injection (subcutaneous)  5,000 Units Subcutaneous Q8H   [START ON 07/20/2023] influenza vac split trivalent PF  0.5 mL Intramuscular Once   insulin aspart  0-24 Units Subcutaneous Q4H   lidocaine  1 patch Transdermal Q24H   lidocaine-prilocaine  1 Application Topical Q M,W,F   metoCLOPramide  5 mg Per Tube TID   midodrine  30 mg Per Tube TID   multivitamin  1 tablet Per Tube BID   mouth rinse  15 mL Mouth Rinse 4 times per day   pantoprazole (PROTONIX) IV  40 mg Intravenous Q12H   polyethylene glycol  17 g Per Tube Daily   QUEtiapine  12.5 mg Per Tube QHS   sodium chloride flush  3 mL Intravenous Q12H   thiamine  100 mg Oral Daily    Infusions:   prismasol BGK 4/2.5 400 mL/hr at 07/28/2023 0425    prismasol BGK 4/2.5 400 mL/hr at 07/15/23 0435   sodium chloride 10 mL/hr at 07/15/23 1000   sodium chloride 10 mL/hr at 07/17/2023 1400   albumin human Stopped (06/23/23 0040)   epinephrine 4 mcg/min (07/15/23 1000)   feeding supplement (PIVOT 1.5 CAL) 70 mL/hr at 07/15/23 1000   norepinephrine (LEVOPHED) Adult infusion Stopped (07/13/23 0720)   prismasol BGK 4/2.5 1,800 mL/hr at 07/15/23 0800    vasopressin 0.04 Units/min (07/15/23 1000)    PRN Medications: sodium chloride, sodium chloride, acetaminophen, albumin human, bisacodyl **OR** bisacodyl, clonazepam, heparin, ondansetron (ZOFRAN) IV, mouth rinse, oxyCODONE, phenol, simethicone, sodium chloride flush  Patient Profile   52 y/o AAM w/ mildly reduced systolic heart failure (EF once 45-50% in 2013 but normal on subsequent echos) valvular heart disease including severe AS, severe MR due to MVP and

## 2023-07-16 ENCOUNTER — Inpatient Hospital Stay (HOSPITAL_COMMUNITY): Payer: Medicare HMO

## 2023-07-16 ENCOUNTER — Encounter (HOSPITAL_COMMUNITY): Payer: Self-pay | Admitting: Vascular Surgery

## 2023-07-16 DIAGNOSIS — R579 Shock, unspecified: Secondary | ICD-10-CM | POA: Diagnosis not present

## 2023-07-16 DIAGNOSIS — Z9889 Other specified postprocedural states: Secondary | ICD-10-CM | POA: Diagnosis not present

## 2023-07-16 DIAGNOSIS — I5023 Acute on chronic systolic (congestive) heart failure: Secondary | ICD-10-CM | POA: Diagnosis not present

## 2023-07-16 DIAGNOSIS — N186 End stage renal disease: Secondary | ICD-10-CM | POA: Diagnosis not present

## 2023-07-16 DIAGNOSIS — E43 Unspecified severe protein-calorie malnutrition: Secondary | ICD-10-CM

## 2023-07-16 DIAGNOSIS — I340 Nonrheumatic mitral (valve) insufficiency: Secondary | ICD-10-CM

## 2023-07-16 LAB — GLUCOSE, CAPILLARY
Glucose-Capillary: 90 mg/dL (ref 70–99)
Glucose-Capillary: 88 mg/dL (ref 70–99)
Glucose-Capillary: 133 mg/dL — ABNORMAL HIGH (ref 70–99)
Glucose-Capillary: 118 mg/dL — ABNORMAL HIGH (ref 70–99)
Glucose-Capillary: 126 mg/dL — ABNORMAL HIGH (ref 70–99)
Glucose-Capillary: 116 mg/dL — ABNORMAL HIGH (ref 70–99)
Glucose-Capillary: 77 mg/dL (ref 70–99)
Glucose-Capillary: 67 mg/dL — ABNORMAL LOW (ref 70–99)

## 2023-07-16 LAB — MAGNESIUM: Magnesium: 2.6 mg/dL — ABNORMAL HIGH (ref 1.7–2.4)

## 2023-07-16 LAB — CBC
HCT: 26.7 % — ABNORMAL LOW (ref 39.0–52.0)
Hemoglobin: 7.9 g/dL — ABNORMAL LOW (ref 13.0–17.0)
MCH: 33.5 pg (ref 26.0–34.0)
MCHC: 29.6 g/dL — ABNORMAL LOW (ref 30.0–36.0)
MCV: 113.1 fL — ABNORMAL HIGH (ref 80.0–100.0)
Platelets: 240 10*3/uL (ref 150–400)
RBC: 2.36 MIL/uL — ABNORMAL LOW (ref 4.22–5.81)
RDW: 20.2 % — ABNORMAL HIGH (ref 11.5–15.5)
WBC: 7.1 10*3/uL (ref 4.0–10.5)
nRBC: 0.7 % — ABNORMAL HIGH (ref 0.0–0.2)

## 2023-07-16 LAB — RENAL FUNCTION PANEL
Albumin: 2.5 g/dL — ABNORMAL LOW (ref 3.5–5.0)
Albumin: 2.4 g/dL — ABNORMAL LOW (ref 3.5–5.0)
Anion gap: 8 (ref 5–15)
Anion gap: 5 (ref 5–15)
BUN: 21 mg/dL — ABNORMAL HIGH (ref 6–20)
BUN: 24 mg/dL — ABNORMAL HIGH (ref 6–20)
CO2: 27 mmol/L (ref 22–32)
CO2: 26 mmol/L (ref 22–32)
Calcium: 11.1 mg/dL — ABNORMAL HIGH (ref 8.9–10.3)
Calcium: 11.1 mg/dL — ABNORMAL HIGH (ref 8.9–10.3)
Chloride: 98 mmol/L (ref 98–111)
Chloride: 101 mmol/L (ref 98–111)
Creatinine, Ser: 1.6 mg/dL — ABNORMAL HIGH (ref 0.61–1.24)
Creatinine, Ser: 1.73 mg/dL — ABNORMAL HIGH (ref 0.61–1.24)
GFR, Estimated: 52 mL/min — ABNORMAL LOW (ref >60)
GFR, Estimated: 47 mL/min — ABNORMAL LOW (ref >60)
Glucose, Bld: 143 mg/dL — ABNORMAL HIGH (ref 70–99)
Glucose, Bld: 117 mg/dL — ABNORMAL HIGH (ref 70–99)
Phosphorus: 2.1 mg/dL — ABNORMAL LOW (ref 2.5–4.6)
Phosphorus: 2.6 mg/dL (ref 2.5–4.6)
Potassium: 4.4 mmol/L (ref 3.5–5.1)
Potassium: 4.9 mmol/L (ref 3.5–5.1)
Sodium: 133 mmol/L — ABNORMAL LOW (ref 135–145)
Sodium: 132 mmol/L — ABNORMAL LOW (ref 135–145)

## 2023-07-16 LAB — COOXEMETRY PANEL
Carboxyhemoglobin: 1.1 % (ref 0.5–1.5)
Methemoglobin: <0.7 % (ref 0.0–1.5)
O2 Saturation: 82.4 %
Total hemoglobin: 8.4 g/dL — ABNORMAL LOW (ref 12.0–16.0)

## 2023-07-16 MED ORDER — SODIUM BICARBONATE 650 MG PO TABS
650.0000 mg | ORAL_TABLET | Freq: Once | ORAL | Status: DC
  Filled 2023-07-16: qty 1

## 2023-07-16 MED ORDER — MAGIC MOUTHWASH
15.0000 mL | Freq: Two times a day (BID) | ORAL | Status: DC | PRN
  Administered 2023-07-16: 15 mL via ORAL
  Filled 2023-07-16 (×2): qty 15

## 2023-07-16 MED ORDER — DEXTROSE 50 % IV SOLN: 12.5000 g | INTRAVENOUS | Status: AC

## 2023-07-16 MED ORDER — PANCRELIPASE (LIP-PROT-AMYL) 10440-39150 UNITS PO TABS
20880.0000 [IU] | ORAL_TABLET | Freq: Once | ORAL | Status: DC
  Filled 2023-07-16: qty 2

## 2023-07-16 MED ORDER — DEXTROSE 50 % IV SOLN
INTRAVENOUS | Status: AC
  Administered 2023-07-16: 12.5 g via INTRAVENOUS
  Filled 2023-07-16: qty 50

## 2023-07-16 MED ORDER — METOCLOPRAMIDE HCL 5 MG PO TABS
2.5000 mg | ORAL_TABLET | Freq: Two times a day (BID) | ORAL | Status: DC
  Administered 2023-07-17: 2.5 mg
  Filled 2023-07-16 (×2): qty 0.5

## 2023-07-16 NOTE — Progress Notes (Signed)
[name] Progress Note  Subjective: epi gtt down from 7 --> 3. VP still at 0.04. levo off. Midodrine 30 tid. Remains on iNO, reducing. Keeping even w/ CRRT, CVP 10-12. Goal 12-13.   Vitals:   07/16/23 1400 07/16/23 1415 07/16/23 1430 07/16/23 1445  BP:      Pulse: 82 82 82 82  Resp: 20 19 (!) 25 (!) 21  Temp:      TempSrc:      SpO2: 98% 100% 100% 100%  Weight:      Height:        Physical Exam:       General adult male in bed on [place] O2 Lungs clear but reduced to auscultation bilaterally  Heart S1S2 Abdomen soft nontender nondistended Extremities no edema appreciated Neuro - resting, somnolent, awakens easily Access RUE AVF with bruit and thrill; left internal jugular nontunneled dialysis catheter    OP HD: [facility] MWF 3rd shift 3h    F250     94kg   2K/2.5 Ca bath   RUE AVF    Summary: [name] is a/an 52 y.o. 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 Underwent aortic biovalve replacement, TV repair, MV repari, bilat Maze procedures.    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 SP AVF ligation 9/06 by [facility]. Weaning epi gtt down.   # ESRD - pt was too hypotensive for hemodialysis post-op.  CRRT started 8/16 and remains on CRRT.   # HD access - temp cath clotted overnight. TPA worked and temp cath is back working again   [facility] shock - weaning epi gtt, cont vaso gtt. Off levo and dobutamine. Cont midodrine 30 tid.   # pHTN - severe by RHC --> is now on inhaled NO.   # Volume - improved, wt's stable. Targeting CVP 12-13 due to severe RV failure per CHF team. Keeping even again today per CHF team.   # Pericardial effusion -  per CT surgery and CHF.  We are optimizing volume status with CRRT as above.  Assessed as moderate on last TTE   # Anemia of ESRD - transfuse as needed per primary team. Aranesp increased to 150 mcg weekly on Tuesdays    # MBD ckd -  Phos acceptable.  No binders currently as on CRRT - normally on renvela.    # Hypercalcemia - remains mild- moderate. Suspect due to immobility. Usually mild-mod hypercalcemia does not require treatment. Will follow.    # Vascular access - RUE AVF had been functioning well but have been unable to use due to shock and CRRT requirement.  Temporary dialysis catheter in place for CRRT.  [name]  MD  [facility] 07/16/2023, 3:02 PM  Recent Labs  Lab 07/09/2023 1038 08/05/2023 1512 07/15/23 1527 07/16/23 0324  HGB 9.9*  9.5*  --   --  7.9*  ALBUMIN  --    < > 2.5* 2.5*  CALCIUM  --    < > 10.2 11.1*  PHOS  --    < > 2.8 2.1*  CREATININE  --    < > 1.66* 1.60*  K  4.6  4.6   < > 4.8 4.4   < > = values in this interval not displayed.    Inpatient medications:  acyclovir  200 mg Per Tube BID   aspirin  81 mg Per Tube Daily   atorvastatin  10 mg Per Tube QHS   azelastine  2 spray Each Nare BID   Chlorhexidine Gluconate Cloth  6 each Topical Daily   cinacalcet  30 mg Oral Q supper   colchicine  0.3 mg Per Tube Once every 5 days   darbepoetin (ARANESP) injection - DIALYSIS  150 mcg Subcutaneous Q Tue-1800   docusate  100 mg Per Tube BID   escitalopram  10 mg Per Tube Daily   folic acid  1 mg Per Tube Daily   guaiFENesin  15 mL Per Tube BID   heparin injection (subcutaneous)  5,000 Units Subcutaneous Q8H   [START ON 07/20/2023] influenza vac split trivalent PF  0.5 mL Intramuscular Once   insulin aspart  0-24 Units Subcutaneous Q4H   lidocaine  1 patch Transdermal Q24H   lidocaine-prilocaine  1 Application Topical Q M,W,F   lipase/protease/amylase)  20,880 Units Per Tube Once   And   sodium bicarbonate  650 mg Per Tube Once   metoCLOPramide  2.5 mg Per Tube BID    midodrine  30 mg Per Tube TID   multivitamin  1 tablet Per Tube BID   mouth rinse  15 mL Mouth Rinse 4 times per day   pantoprazole (PROTONIX) IV  40 mg Intravenous Q12H   polyethylene glycol  17 g Per Tube Daily   QUEtiapine  12.5 mg Per Tube QHS   sodium chloride flush  3 mL Intravenous Q12H   thiamine  100 mg Oral Daily     prismasol BGK 4/2.5 400 mL/hr at 07/16/23 0058    prismasol BGK 4/2.5 400 mL/hr at 07/16/23 0604   sodium chloride 10 mL/hr at 07/16/23 1400   sodium chloride 10 mL/hr at 07/16/2023 1400   albumin human Stopped (06/23/23 0040)   epinephrine 3 mcg/min (07/16/23 1400)   feeding supplement (PIVOT 1.5 CAL) Stopped (07/16/23 1126)   norepinephrine (LEVOPHED) Adult infusion Stopped (07/13/23 0720)   prismasol BGK 4/2.5 1,800 mL/hr at 07/16/23 1118   vasopressin 0.04 Units/min (07/16/23 1400)   sodium chloride, sodium chloride, acetaminophen, albumin human, bisacodyl **OR** bisacodyl, clonazepam, heparin, magic mouthwash, ondansetron (ZOFRAN) IV, mouth rinse, oxyCODONE, phenol, simethicone, sodium chloride flush

## 2023-07-16 NOTE — Progress Notes (Signed)
RT decreased nitric from 20ppm to 15ppm per MD order.

## 2023-07-16 NOTE — Progress Notes (Signed)
NAME:  [name], MRN:  [id number], DOB:  [birth year], LOS: 24 ADMISSION DATE:  2023-07-03, CONSULTATION DATE: 07-03-23 REFERRING MD: Leafy Ro - TCTS, CHIEF COMPLAINT: Postcardiac surgery  History of Present Illness:  This is a 52 year old gentleman, past medical history of anxiety depression, bipolar disease, congestive heart failure chronic diastolic heart failure kidney disease followed by renal transplant in 10/07/1998 repeat renal transplant in 10-07-14.  Patient underwent surgery today for severe mitral regurgitation severe tricuspid regurgitation and moderate aortic stenosis.  He also has atrial fibrillation received maze procedure.  He had a complex mitral valve repair a tricuspid valve annuloplasty and aortic valve replacement followed by full right and left maze procedure.  Pertinent Medical History:   Past Medical History:  Diagnosis Date   ADHD (attention deficit hyperactivity disorder)    Anemia    Anemia, chronic disease 07/13/2011   Anxiety    Arthritis    Bipolar 2 disorder (HCC) 08/18/2011   Diagnosed in 10-07-10.    Bipolar disorder (HCC)    Blood dyscrasia    Nephrotic Anemia   Blood transfusion    Blood transfusion without reported diagnosis    Chronic diastolic CHF (congestive heart failure) (HCC)    Complication of anesthesia    Woke up intubated gets combative  afraid to be alone    Congestive heart failure (CHF) (HCC) 07/13/2011   Onset 07-Oct-2005.  Followed by Dr. Eden Emms.  S/p cardiac catheterization in [facility] Dr. Anne Fu..  S/p cardiac catheterization in Oct 07, 2013 by Nishan.  Echo 10/07/13.    Coronary artery disease    Depression    Dysrhythmia    A. Fib October 08, 2023 with DCCV in May 2024   Erectile dysfunction    ESRD on hemodialysis (HCC) 07/13/2011   Glomerulonephritis at age 73, started HD in 10/08/1995.  Deceased donor renal transplant 10-07-1998 at [facility], then kidney failed and went back on HD in 07-Oct-2005.  L forearm AVF . s/p repeat renal transplant 10/2014 [facility].   GI bleed 11/01/2011   Rectal  bleeding with emesis/diarrhea    Heart murmur    Hemodialysis status (HCC)    HSV (herpes simplex virus) infection    Hydrocele, right 11/25/2014   Hypertension    Hypertensive emergency 01/06/2013   Hypoglycemia 01/11/2013   Influenza-like illness 12/18/2013   Mild ascending aorta dilatation (HCC)    NICM (nonischemic cardiomyopathy) (HCC)    Pericardial effusion    a. Mod by echo in 10-07-2013, similar to prior.   Peripheral polyneuropathy 08/04/2016   Pneumonia    Pulmonary edema 01/06/2013   Pulmonary hypertension associated with end stage renal disease on dialysis [facility]) 07/13/2011   Renal transplant, status post 11/25/2014   Pt with Glomerulonephritis at age 40. Deceased donor renal transplant 10-07-98 right and left renal transplant 10/2014.  Followed by [facility] transplant team; nephrologist is Dr. Lonna Cobb.    Respiratory failure with hypoxia (HCC) 01/06/2013   Shortness of breath 12/12/2012   Sleep apnea    Swollen testicle 07/13/2011   URI (upper respiratory infection) 01/25/2012    Significant Hospital Events: Including procedures, antibiotic start and stop dates in addition to other pertinent events   07/03/2023 - AVR, MVR, TVR with TCTS ([place]). Remains on multiple pressors (NE, Epi, vaso). 8/16 - LIJ Trialysis catheter placed for CRRT.  8/17 extubated  8/18 - remains on CVVHD, 3 pressors  8/19-8/20 improving pressor needs 8/21 agitated delirium; cortrak 8/26 CRRT restarted 8/27 TVP placed, brady overnight and pacing turned to 80 8/30 palli consult  9/1 PRBC for low coox  9/2 coox down a bit again  9/3CRRT off overnight due to access issues. Remains on vaso 0.04, epi 10, norepi 2, DBA 5. 9/4 RHC, started iNO 2023/07/15 repeat RHC, fistula ligated  Interim History / Subjective:    REmains on vaso 0.03, epi . He denies complaints today other than coughing more.   Objective:  Blood pressure (!) 92/35, pulse 82, temperature 97.7 F (36.5 C), temperature source Oral,  resp. rate 20, height 6\' 3"  (1.905 m), weight 75.2 kg, SpO2 100%. CVP:  [6 mmHg-20 mmHg] 8 mmHg      Intake/Output Summary (Last 24 hours) at 07/16/2023 0758 Last data filed at 07/16/2023 0700 Gross per 24 hour  Intake 2791.15 ml  Output 3565 ml  Net -773.85 ml   Filed Weights   07-15-2023 0453 07/15/23 0500 07/16/23 0500  Weight: 79 kg 75.8 kg 75.2 kg   Physical Examination:    General: chronically ill appearing man lying in bed sleeping HEENT: temporal wasting, oral mucosa moist CV: S1S2, RRR Pulm: breathing comfortably on [place], iNO, no rhales. Wet-soundjng cough GI: soft, NT Derm: warm, dry, no rashes MSK: minimal muscle mass, no edema Neuro: arouses to verbal stimulation, quiet voice, globally weak.   BUN 21, Cr1.60 on CRRT Coox 82.4% CXR : cardiomegaly, no obvious infitlrates, overall unchanged from previous  Resolved issues:  Possible PNA. Right pleural effusion - s/p pigtail chest tube with subsequent removal 8/25.   Assessment & Plan:   ICU delirium, acute encephalopathy - improving.  -delirium precautions -avoid sedating meds -unfortunately mobility is restricted with CRRT-dependence -con't lexapro and seroquel QHS  Acute respiratory failure with hypoxia & hypercapnia -wean iNO slowly -supplemental O2 to maintain SpO2 >90% -BiPAP PRN -CRRT for volume management -pulmonary hygiene -CXR today  MVR, TVR, AS s/p bioprosthetic replacement CHB/ post op junctional rhythm s/p TVP (8/27) Cardiogenic shock, RV failure, high output heart failure Enlarging pericardial effusion w/o tamponade AoC HFrEF> currently EF recovered Afib s/p MAZE  -post op distributive/vasoplegia, improved. Possible sepsis now off abx. Has RV failure. Pericardial effusion without tamponade physiology P -appreciate TCTS management -con't weaning inotropes and pressors to maintain SBP >95 -anticipate he needs PPM eventually -high dose midodrine  ESRD; CKD s/p failed renal transplants x 2  (1999, 2015)  Hypophosphatemia Hyponatremia  Hypokalemia  Hypercalcemia  Hypermagnesemia  History of kidney disease status post renal transplant (failed 1999 and 2015). P -CRRT until able to tolerate iHD; still on epi 3 -fistula ligated, will need permacath eventually -strict I/O  -renally dose meds, avoid nephrotoxic meds  H/o HSV -con't acyclovir, PTA med  Anemia - expected ABLA (resolved), critical illness, chronic dz  -transfuse for Hb <7 or hemodynamically significant bleeding -aranesp per nephro   Severe protein energy malnutrition  -con't TF, can eat as he can tolerate -weaning reglan; hopefully can d/c tomorrow  Possible gout flare -con't colchicine  Sore throat -magic mouthwash  Physical deconditioning  -needs aggressive PT, OT, SLP; CRRT complicates this. -anticipate he will need CIR  GOC -Family had been updated 9/3. Full aggressive care desired.     Best Practice (right click and "Reselect all SmartList Selections" daily)   Diet/type:TF  DVT prophylaxis:heparin GI prophylaxis: PPI Lines: Central line, Dialysis Catheter, and Arterial Line Foley:  No  Code Status:  full code Last date of multidisciplinary goals of care discussion [Per Primary Team]  This patient is critically ill with multiple organ system failure which requires frequent high complexity decision making,  assessment, support, evaluation, and titration of therapies. This was completed through the application of advanced monitoring technologies and extensive interpretation of multiple databases. During this encounter critical care time was devoted to patient care services described in this note for 35 minutes.  Steffanie Dunn, DO 07/16/23 12:54 PM [facility]  For contact information, see Amion. If no response to pager, please call PCCM consult pager. After hours, 7PM- 7AM, please call Elink.

## 2023-07-16 NOTE — Progress Notes (Signed)
[address]       [place] [address]             [phone]                 2 Days Post-Op Procedure(s) (LRB): LIGATION OF RIGHT ARTERIOVENOUS  FISTULA (Right)   Events: No events _______________________________________________________________ Vitals: BP (!) 92/35   Pulse 82   Temp 98.1 F (36.7 C)   Resp (!) 24   Ht 6\' 3"  (1.905 m)   Wt 75.2 kg   SpO2 100%   BMI 20.72 kg/m  Filed Weights   07/25/2023 0453 07/15/23 0500 07/16/23 0500  Weight: 79 kg 75.8 kg 75.2 kg     - Neuro: alert NAD  - Cardiovascular: paced  Drips: epi 7.  Vaso 0.04.    iNO CVP:  [3 mmHg-20 mmHg] 10 mmHg  - Pulm: EWOB    ABG    Component Value Date/Time   PHART 7.317 (L) 07/11/2023 1346   PCO2ART 48.2 (H) 07/11/2023 1346   PO2ART 103 07/11/2023 1346   HCO3 27.9 07/13/2023 1038   HCO3 27.6 08/03/2023 1038   TCO2 29 07/23/2023 1038   TCO2 29 07/09/2023 1038   ACIDBASEDEF 1.0 07/17/2023 1517   O2SAT 82.4 07/16/2023 0327    - Abd: ND - Extremity: warm  .Intake/Output      09/07 0701 09/08 0700 09/08 0701 09/09 0700   P.O. 240    I.V. (mL/kg) 736.2 (9.8) 60.6 (0.8)   NG/GT 1815 240   Total Intake(mL/kg) 2791.2 (37.1) 300.6 (4)   Stool 200    CRRT 3365 36   Total Output 3565 36   Net -773.9 +264.6           _______________________________________________________________ Labs:    Latest Ref Rng & Units 07/16/2023    3:24 AM 08/01/2023   10:38 AM 08/05/2023    3:17 PM  CBC  WBC 4.0 - 10.5 K/uL 7.1     Hemoglobin 13.0 - 17.0 g/dL 7.9  9.5    9.9  9.5    9.2   Hematocrit 39.0 - 52.0 % 26.7  28.0    29.0  28.0    27.0   Platelets 150 - 400 K/uL 240         Latest Ref Rng & Units 07/16/2023    3:24 AM 07/15/2023    3:27 PM 07/15/2023    4:28 AM  CMP  Glucose 70 - 99 mg/dL 295  621  308   BUN 6 - 20 mg/dL 21  21  22    Creatinine 0.61 - 1.24 mg/dL 6.57  8.46  9.62   Sodium 135 - 145 mmol/L 133  134  129   Potassium 3.5 - 5.1 mmol/L 4.4  4.8  4.7    Chloride 98 - 111 mmol/L 98  98  98   CO2 22 - 32 mmol/L 27  26  24    Calcium 8.9 - 10.3 mg/dL 95.2  84.1  32.4     CXR: -  _______________________________________________________________  Assessment and Plan: s/p AVR/MVR/TVR.  S/p fistula ligation  Neuro: pain controlled CV: remains paced.  Will need pacer eventually.  Weaning gtts.  Will remain on iNO Pulm: IS Renal: on CRRT GI: on diet Heme: stable ID: [email or web address] Endo: SSI Dispo: continue ICU care   Corliss Skains 07/16/2023 9:44 AM

## 2023-07-16 NOTE — Progress Notes (Signed)
Advanced Heart Failure Rounding Note  PCP-Cardiologist: Reatha Harps, MD   Subjective:   8/15: Elective triple vale surgery + MAZE. Underwent aortic valve replacement + mitral and tricuspid valve repairs by Dr. Milinda Antis 8/22: AHF consulted for high-output HF with persistent post-op shock 8/25: CRRT stopped 8/26: CRRT restarted  8/27: S/p TTVP Echo: EF 70-75%, severe LVH, RV mod reduced, RV severely enlarged. LA/RA mod dilated. Large pericardial effusion. No evidence of tamponade.  9/4: RHC c/w severe PAH with normal output and low SVR. PVR 4-5. SBP improved nearly 20 points with AVF occlusion.  2023/07/23 Repeat RHC with subsequent ligation of AVF SVR ~500 despite high-dose pressors  AVF ligated yesterday 07-23-23.   Pressor requirements coming down Now on epi 7 -> 3. VP 0.04. NE off. + midodrine 30 tid. Remains on iNO  On CVVHD keeping even. CVP 10-12  Remains weak. Ab pain better.   Objective:    Weight Range: 75.2 kg Body mass index is 20.72 kg/m.   Vital Signs:   Temp:  [97.6 F (36.4 C)-98.1 F (36.7 C)] 98.1 F (36.7 C) (09/08 0800) Pulse Rate:  [81-82] 82 (09/08 1030) Resp:  [8-31] 24 (09/08 1030) BP: (92-111)/(35-44) 92/35 (09/08 0411) SpO2:  [99 %-100 %] 100 % (09/08 1030) Arterial Line BP: (92-117)/(32-49) 107/37 (09/08 1030) Weight:  [75.2 kg] 75.2 kg (09/08 0500) Last BM Date : 07/16/23  Weight change: Filed Weights   23-Jul-2023 0453 07/15/23 0500 07/16/23 0500  Weight: 79 kg 75.8 kg 75.2 kg   Intake/Output:   Intake/Output Summary (Last 24 hours) at 07/16/2023 1057 Last data filed at 07/16/2023 1000 Gross per 24 hour  Intake 2800.78 ml  Output 3142 ml  Net -341.22 ml    Physical Exam   General:  Cachetic. Weak appearing. No resp difficulty HEENT: normal + cor-trak Neck: supple. + RIJ TVP  Cor: Regular rate & rhythm. 2/6 TR Lungs: clear Abdomen: soft, nontender, nondistended. No hepatosplenomegaly. No bruits or masses. Good bowel sounds. Extremities:  no cyanosis, clubbing, rash, edema Neuro: alert & orientedx3, cranial nerves grossly intact. moves all 4 extremities but weak    Telemetry   V paced 80s. Personally reviewed  Labs    CBC Recent Labs    Jul 23, 2023 1038 07/16/23 0324  WBC  --  7.1  HGB 9.9*  9.5* 7.9*  HCT 29.0*  28.0* 26.7*  MCV  --  113.1*  PLT  --  240   Basic Metabolic Panel Recent Labs    91/47/82 0428 07/15/23 1527 07/16/23 0324  NA 129* 134* 133*  K 4.7 4.8 4.4  CL 98 98 98  CO2 24 26 27   GLUCOSE 155* 121* 143*  BUN 22* 21* 21*  CREATININE 1.76* 1.66* 1.60*  CALCIUM 10.4* 10.2 11.1*  MG 2.6*  --  2.6*  PHOS 2.4* 2.8 2.1*   Liver Function Tests Recent Labs    07/15/23 1527 07/16/23 0324  ALBUMIN 2.5* 2.5*   No results for input(s): "LIPASE", "AMYLASE" in the last 72 hours. Cardiac Enzymes No results for input(s): "CKTOTAL", "CKMB", "CKMBINDEX", "TROPONINI" in the last 72 hours.  BNP: BNP (last 3 results) No results for input(s): "BNP" in the last 8760 hours.  ProBNP (last 3 results) No results for input(s): "PROBNP" in the last 8760 hours.  D-Dimer No results for input(s): "DDIMER" in the last 72 hours. Hemoglobin A1C No results for input(s): "HGBA1C" in the last 72 hours. Fasting Lipid Panel No results for input(s): "CHOL", "HDL", "LDLCALC", "TRIG", "  CHOLHDL", "LDLDIRECT" in the last 72 hours. Thyroid Function Tests No results for input(s): "TSH", "T4TOTAL", "T3FREE", "THYROIDAB" in the last 72 hours.  Invalid input(s): "FREET3"  Other results:  Imaging   No results found.  Medications:     Scheduled Medications:  acyclovir  200 mg Per Tube BID   aspirin  81 mg Per Tube Daily   atorvastatin  10 mg Per Tube QHS   azelastine  2 spray Each Nare BID   Chlorhexidine Gluconate Cloth  6 each Topical Daily   cinacalcet  30 mg Oral Q supper   colchicine  0.3 mg Per Tube Once every 5 days   darbepoetin (ARANESP) injection - DIALYSIS  150 mcg Subcutaneous Q Tue-1800    docusate  100 mg Per Tube BID   escitalopram  10 mg Per Tube Daily   folic acid  1 mg Per Tube Daily   guaiFENesin  15 mL Per Tube BID   heparin injection (subcutaneous)  5,000 Units Subcutaneous Q8H   [START ON 07/20/2023] influenza vac split trivalent PF  0.5 mL Intramuscular Once   insulin aspart  0-24 Units Subcutaneous Q4H   lidocaine  1 patch Transdermal Q24H   lidocaine-prilocaine  1 Application Topical Q M,W,F   lipase/protease/amylase)  20,880 Units Per Tube Once   And   sodium bicarbonate  650 mg Per Tube Once   metoCLOPramide  5 mg Per Tube BID   midodrine  30 mg Per Tube TID   multivitamin  1 tablet Per Tube BID   mouth rinse  15 mL Mouth Rinse 4 times per day   pantoprazole (PROTONIX) IV  40 mg Intravenous Q12H   polyethylene glycol  17 g Per Tube Daily   QUEtiapine  12.5 mg Per Tube QHS   sodium chloride flush  3 mL Intravenous Q12H   thiamine  100 mg Oral Daily    Infusions:   prismasol BGK 4/2.5 400 mL/hr at 07/16/23 0058    prismasol BGK 4/2.5 400 mL/hr at 07/16/23 0604   sodium chloride 10 mL/hr at 07/16/23 1000   sodium chloride 10 mL/hr at 07/30/2023 1400   albumin human Stopped (06/23/23 0040)   epinephrine 3 mcg/min (07/16/23 1000)   feeding supplement (PIVOT 1.5 CAL) 70 mL/hr at 07/16/23 1000   norepinephrine (LEVOPHED) Adult infusion Stopped (07/13/23 0720)   prismasol BGK 4/2.5 1,800 mL/hr at 07/16/23 0854   vasopressin 0.04 Units/min (07/16/23 1000)    PRN Medications: sodium chloride, sodium chloride, acetaminophen, albumin human, bisacodyl **OR** bisacodyl, clonazepam, heparin, ondansetron (ZOFRAN) IV, mouth rinse, oxyCODONE, phenol, simethicone, sodium chloride flush  Patient Profile   52 y/o AAM w/ mildly reduced systolic heart failure (EF once 45-50% in 2013 but normal on subsequent echos) valvular heart disease including severe AS, severe MR due to MVP and severe TR, atrial fibrillation, pulmonary hypertension, ESRD s/p failed kidney transplants  in 1999 and again in 2015. 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 EF 55-60% w/ mild RV dysfunction by echo in 6/24.    Admitted for elective triple vale surgery + MAZE. S/p bioprosthetic aortic valve replacement + mitral and tricuspid valve repairs. Post-op course c/b persistent vasoplagic syndrome.   Assessment/Plan   1. Post-op Vasopalegia/high-output HF - Echo 06/29/23 EF 60-65%, no RWMA, small pericardial effusion present, MV repaired/replaced, trivial MR. TR repaired/replaced, mild TR. AV repaired/replaced.  - Echo July 31, 2023: EF 70-75%, severe concentric LVH, RV mod reduced, RV severely enlarged. LA/RA mod dilated. Large pericardial effusion. No evidence of tamponade -  Echo 08/29: Moderate pericardial effusion, no tamponade - RHC c/w high output HF physiology 2/2 AV fistula despite severe RV dysfunction  - s/p AVF ligation on 9/6 - Continue to wean pressors for SBP 100 (has wide pulse pressure)  - CVP 10-12. Targeting CVP 12-13 d/t severe RV failure. Keep even on CRRT today (Pulled too hard overnight)   2.  Acute on Chronic Systolic Heart Failure w/ Prominent RV Dysfunction  - LVEF once 45-50% in 2013 but normal on subsequent echos. Pre-op TTE 6/25 EF 55-60%, GIIIDD (Restrictive) RV mildly reduced, mod PH w/ estimated RVSP 48 mmHg  - Intra-op TEE (while on 4 mcg of Epi) LVEF 45%, RV mod-severely reduced, stable AV prosthesis w/ normal gradient and stable valvuloplasty rings w/ mod residual MS, mean gradient 7 mmgH.   - echo 07/03/2023: EF 70-75%, severe concentric LVH, RV mod reduced, RV severely enlarged. LA/RA mod dilated. Large pericardial effusion. No evidence of tamponade.  - Echo 08/29: EF 65-70%, RV moderately reduced, pericardial effusion slightly smaller, no tamponade - RHC c/w high output HF physiology with very high pulmonary pressures 2/2 AV fistula despite severe RV dysfunction  - Cont Midodrine 30 mg TID - Wean epi as tolerated today for SBP >= 100  Continue midodrine and VP.  Wean iNO today 20 -> 15  3. 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 Post-op CHB  - EP following. - S/p RIJ temp wire 08/27 - EPW left in place d/t concern for risk of tamponade - V paced 80. Underneath junctional in 30s today   5. 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 Nephrology following.  - Plan as above   6. H/o Atrial Fibrillation  - S/p MAZE    7. Large pericardial effusion - seen on echo 06/10/2023. Previously small on echo 06/29/23 - no evidence of tamponade - Limited echo 08/29 stable  8. Severe PAH - pre-op PA pressures 58/30. With PVR 1.2 CO 10L  - post-op PA 97/33 (55) PCWP 30 PVR 4.2 CO 7L - suspect combination of valvular heart disease and high-output physiology - should improved with AVF ligation - Wean iNO today 20 -> 15  9. GOC - palliative care initially following. Now signed off 9/2 per patient request - remains full code  CRITICAL CARE Performed by: Arvilla Meres  Total critical care time: 40 minutes  Critical care time was exclusive of separately billable procedures and treating other patients.  Critical care was necessary to treat or prevent imminent or life-threatening deterioration.  Critical care was time spent personally by me (independent of midlevel providers or residents) on the following activities: development of treatment plan with patient and/or surrogate as well as nursing, discussions with consultants, evaluation of patient's response to treatment, examination of patient, obtaining history from patient or surrogate, ordering and performing treatments and interventions, ordering and review of laboratory studies, ordering and review of radiographic studies, pulse oximetry and re-evaluation of  patient's condition.   Length of Stay: 24  Arvilla Meres, MD  07/16/2023, 10:57 AM  Advanced Heart Failure Team Pager [phone] (M-F; 7a - 5p)  Please contact [facility] for night-coverage after hours (5p -7a ) and weekends on [email or web address]

## 2023-07-16 NOTE — Plan of Care (Signed)
  Problem: Education: Goal: Knowledge of General Education information will improve Description: Including pain rating scale, medication(s)/side effects and non-pharmacologic comfort measures Outcome: Progressing   Problem: Health Behavior/Discharge Planning: Goal: Ability to manage health-related needs will improve Outcome: Progressing   Problem: Clinical Measurements: Goal: Ability to maintain clinical measurements within normal limits will improve Outcome: Progressing Goal: Will remain free from infection Outcome: Progressing Goal: Diagnostic test results will improve Outcome: Progressing Goal: Respiratory complications will improve Outcome: Progressing Goal: Cardiovascular complication will be avoided Outcome: Progressing   Problem: Activity: Goal: Risk for activity intolerance will decrease Outcome: Progressing   Problem: Nutrition: Goal: Adequate nutrition will be maintained Outcome: Progressing   Problem: Coping: Goal: Level of anxiety will decrease Outcome: Progressing   Problem: Elimination: Goal: Will not experience complications related to bowel motility Outcome: Progressing   Problem: Pain Managment: Goal: General experience of comfort will improve Outcome: Progressing   Problem: Safety: Goal: Ability to remain free from injury will improve Outcome: Progressing   Problem: Skin Integrity: Goal: Risk for impaired skin integrity will decrease Outcome: Progressing   Problem: Education: Goal: Knowledge of disease or condition will improve Outcome: Progressing Goal: Knowledge of the prescribed therapeutic regimen will improve Outcome: Progressing   Problem: Activity: Goal: Risk for activity intolerance will decrease Outcome: Progressing   Problem: Cardiac: Goal: Will achieve and/or maintain hemodynamic stability Outcome: Progressing   Problem: Clinical Measurements: Goal: Postoperative complications will be avoided or minimized Outcome:  Progressing   Problem: Respiratory: Goal: Respiratory status will improve Outcome: Progressing   Problem: Skin Integrity: Goal: Wound healing without signs and symptoms of infection Outcome: Progressing Goal: Risk for impaired skin integrity will decrease Outcome: Progressing   Problem: Urinary Elimination: Goal: Ability to achieve and maintain adequate renal perfusion and functioning will improve Outcome: Progressing   Problem: Education: Goal: Understanding of CV disease, CV risk reduction, and recovery process will improve Outcome: Progressing Goal: Individualized Educational Video(s) Outcome: Progressing   Problem: Activity: Goal: Ability to return to baseline activity level will improve Outcome: Progressing   Problem: Cardiovascular: Goal: Ability to achieve and maintain adequate cardiovascular perfusion will improve Outcome: Progressing Goal: Vascular access site(s) Level 0-1 will be maintained Outcome: Progressing   Problem: Health Behavior/Discharge Planning: Goal: Ability to safely manage health-related needs after discharge will improve Outcome: Progressing

## 2023-07-17 ENCOUNTER — Encounter (HOSPITAL_COMMUNITY): Payer: Self-pay | Admitting: Internal Medicine

## 2023-07-17 ENCOUNTER — Inpatient Hospital Stay (HOSPITAL_COMMUNITY): Payer: Medicare HMO

## 2023-07-17 DIAGNOSIS — R579 Shock, unspecified: Secondary | ICD-10-CM | POA: Diagnosis not present

## 2023-07-17 DIAGNOSIS — Z952 Presence of prosthetic heart valve: Secondary | ICD-10-CM

## 2023-07-17 DIAGNOSIS — Z9889 Other specified postprocedural states: Secondary | ICD-10-CM | POA: Diagnosis not present

## 2023-07-17 DIAGNOSIS — I5023 Acute on chronic systolic (congestive) heart failure: Secondary | ICD-10-CM | POA: Diagnosis not present

## 2023-07-17 LAB — ECHOCARDIOGRAM COMPLETE
AR max vel: 2.21 cm2
AV Area VTI: 2.51 cm2
AV Area mean vel: 2 cm2
AV Mean grad: 12 mmHg
AV Peak grad: 16.2 mmHg
Ao pk vel: 2.02 m/s
Area-P 1/2: 1.96 cm2
Height: 75 in
MV VTI: 1.46 cm2
S' Lateral: 2.8 cm
Weight: 2684.32 [oz_av]

## 2023-07-17 LAB — COOXEMETRY PANEL
Carboxyhemoglobin: 1.8 % — ABNORMAL HIGH (ref 0.5–1.5)
Carboxyhemoglobin: 2.5 % — ABNORMAL HIGH (ref 0.5–1.5)
Carboxyhemoglobin: 2.9 % — ABNORMAL HIGH (ref 0.5–1.5)
Methemoglobin: 1.5 % (ref 0.0–1.5)
Methemoglobin: 0.8 % (ref 0.0–1.5)
Methemoglobin: <0.7 % (ref 0.0–1.5)
O2 Saturation: 99.3 %
O2 Saturation: 87.5 %
O2 Saturation: 90.4 %
Total hemoglobin: 8 g/dL — ABNORMAL LOW (ref 12.0–16.0)
Total hemoglobin: 7.8 g/dL — ABNORMAL LOW (ref 12.0–16.0)
Total hemoglobin: 7.9 g/dL — ABNORMAL LOW (ref 12.0–16.0)

## 2023-07-17 LAB — RENAL FUNCTION PANEL
Albumin: 2.3 g/dL — ABNORMAL LOW (ref 3.5–5.0)
Albumin: 2.3 g/dL — ABNORMAL LOW (ref 3.5–5.0)
Anion gap: 5 (ref 5–15)
Anion gap: 4 — ABNORMAL LOW (ref 5–15)
BUN: 18 mg/dL (ref 6–20)
BUN: 18 mg/dL (ref 6–20)
CO2: 26 mmol/L (ref 22–32)
CO2: 27 mmol/L (ref 22–32)
Calcium: 11.2 mg/dL — ABNORMAL HIGH (ref 8.9–10.3)
Calcium: 11.2 mg/dL — ABNORMAL HIGH (ref 8.9–10.3)
Chloride: 102 mmol/L (ref 98–111)
Chloride: 100 mmol/L (ref 98–111)
Creatinine, Ser: 1.56 mg/dL — ABNORMAL HIGH (ref 0.61–1.24)
Creatinine, Ser: 1.54 mg/dL — ABNORMAL HIGH (ref 0.61–1.24)
GFR, Estimated: 54 mL/min — ABNORMAL LOW (ref >60)
GFR, Estimated: 55 mL/min — ABNORMAL LOW (ref >60)
Glucose, Bld: 130 mg/dL — ABNORMAL HIGH (ref 70–99)
Glucose, Bld: 108 mg/dL — ABNORMAL HIGH (ref 70–99)
Phosphorus: 2.7 mg/dL (ref 2.5–4.6)
Phosphorus: 2.6 mg/dL (ref 2.5–4.6)
Potassium: 4.5 mmol/L (ref 3.5–5.1)
Potassium: 4.6 mmol/L (ref 3.5–5.1)
Sodium: 133 mmol/L — ABNORMAL LOW (ref 135–145)
Sodium: 131 mmol/L — ABNORMAL LOW (ref 135–145)

## 2023-07-17 LAB — GLUCOSE, CAPILLARY
Glucose-Capillary: 106 mg/dL — ABNORMAL HIGH (ref 70–99)
Glucose-Capillary: 111 mg/dL — ABNORMAL HIGH (ref 70–99)
Glucose-Capillary: 86 mg/dL (ref 70–99)
Glucose-Capillary: 86 mg/dL (ref 70–99)
Glucose-Capillary: 89 mg/dL (ref 70–99)
Glucose-Capillary: 93 mg/dL (ref 70–99)

## 2023-07-17 LAB — CBC
HCT: 25 % — ABNORMAL LOW (ref 39.0–52.0)
Hemoglobin: 7.3 g/dL — ABNORMAL LOW (ref 13.0–17.0)
MCH: 32.3 pg (ref 26.0–34.0)
MCHC: 29.2 g/dL — ABNORMAL LOW (ref 30.0–36.0)
MCV: 110.6 fL — ABNORMAL HIGH (ref 80.0–100.0)
Platelets: 236 10*3/uL (ref 150–400)
RBC: 2.26 MIL/uL — ABNORMAL LOW (ref 4.22–5.81)
RDW: 20.1 % — ABNORMAL HIGH (ref 11.5–15.5)
WBC: 6.1 10*3/uL (ref 4.0–10.5)
nRBC: 0.7 % — ABNORMAL HIGH (ref 0.0–0.2)

## 2023-07-17 LAB — MAGNESIUM: Magnesium: 2.5 mg/dL — ABNORMAL HIGH (ref 1.7–2.4)

## 2023-07-17 MED ORDER — DOCUSATE SODIUM 50 MG/5ML PO LIQD: 100.0000 mg | Freq: Two times a day (BID) | ORAL | Status: DC | PRN

## 2023-07-17 MED ORDER — INSULIN ASPART 100 UNIT/ML IJ SOLN: 0.0000 [IU] | INTRAMUSCULAR | Status: DC

## 2023-07-17 MED ORDER — POLYETHYLENE GLYCOL 3350 17 G PO PACK: 17.0000 g | PACK | Freq: Every day | ORAL | Status: DC | PRN

## 2023-07-17 NOTE — Plan of Care (Signed)
  Problem: Education: Goal: Knowledge of General Education information will improve Description: Including pain rating scale, medication(s)/side effects and non-pharmacologic comfort measures Outcome: Progressing   Problem: Health Behavior/Discharge Planning: Goal: Ability to manage health-related needs will improve Outcome: Progressing   Problem: Clinical Measurements: Goal: Ability to maintain clinical measurements within normal limits will improve Outcome: Progressing Goal: Will remain free from infection Outcome: Progressing Goal: Diagnostic test results will improve Outcome: Progressing Goal: Respiratory complications will improve Outcome: Progressing Goal: Cardiovascular complication will be avoided Outcome: Progressing   Problem: Activity: Goal: Risk for activity intolerance will decrease Outcome: Progressing   Problem: Nutrition: Goal: Adequate nutrition will be maintained Outcome: Progressing   Problem: Coping: Goal: Level of anxiety will decrease Outcome: Progressing   Problem: Elimination: Goal: Will not experience complications related to bowel motility Outcome: Progressing   Problem: Pain Managment: Goal: General experience of comfort will improve Outcome: Progressing   Problem: Safety: Goal: Ability to remain free from injury will improve Outcome: Progressing   Problem: Skin Integrity: Goal: Risk for impaired skin integrity will decrease Outcome: Progressing   Problem: Education: Goal: Knowledge of disease or condition will improve Outcome: Progressing Goal: Knowledge of the prescribed therapeutic regimen will improve Outcome: Progressing   Problem: Activity: Goal: Risk for activity intolerance will decrease Outcome: Progressing   Problem: Cardiac: Goal: Will achieve and/or maintain hemodynamic stability Outcome: Progressing   Problem: Clinical Measurements: Goal: Postoperative complications will be avoided or minimized Outcome:  Progressing   Problem: Respiratory: Goal: Respiratory status will improve Outcome: Progressing   Problem: Skin Integrity: Goal: Wound healing without signs and symptoms of infection Outcome: Progressing Goal: Risk for impaired skin integrity will decrease Outcome: Progressing   Problem: Urinary Elimination: Goal: Ability to achieve and maintain adequate renal perfusion and functioning will improve Outcome: Progressing   Problem: Education: Goal: Understanding of CV disease, CV risk reduction, and recovery process will improve Outcome: Progressing Goal: Individualized Educational Video(s) Outcome: Progressing   Problem: Activity: Goal: Ability to return to baseline activity level will improve Outcome: Progressing   Problem: Cardiovascular: Goal: Ability to achieve and maintain adequate cardiovascular perfusion will improve Outcome: Progressing Goal: Vascular access site(s) Level 0-1 will be maintained Outcome: Progressing   Problem: Health Behavior/Discharge Planning: Goal: Ability to safely manage health-related needs after discharge will improve Outcome: Progressing

## 2023-07-17 NOTE — Progress Notes (Signed)
Remains on vasopressors, requirements decreasing with reduced hourly UF negative volume on CRRT. Threshold remains stable at <1 mA.   Remains too ill at this time for PPM considerations.  EP will continue to follow.  Discussed with Dr. Nelly Laurence.    Canary Brim, MSN, APRN, NP-C, AGACNP-BC [facility] - Electrophysiology  07/17/2023, 9:38 AM

## 2023-07-17 NOTE — Progress Notes (Signed)
NAME:  [name], MRN:  [id number], DOB:  01-30-1971, LOS: 25 ADMISSION DATE:  06/20/2023, CONSULTATION DATE: 06/25/2023 REFERRING MD: Leafy Ro - TCTS, CHIEF COMPLAINT: Postcardiac surgery  History of Present Illness:  This is a 52 year old gentleman, past medical history of anxiety depression, bipolar disease, congestive heart failure chronic diastolic heart failure kidney disease followed by renal transplant in 09-30-1998 repeat renal transplant in September 30, 2014.  Patient underwent surgery today for severe mitral regurgitation severe tricuspid regurgitation and moderate aortic stenosis.  He also has atrial fibrillation received maze procedure.  He had a complex mitral valve repair a tricuspid valve annuloplasty and aortic valve replacement followed by full right and left maze procedure.  Pertinent Medical History:   Past Medical History:  Diagnosis Date   ADHD (attention deficit hyperactivity disorder)    Anemia    Anemia, chronic disease 07/13/2011   Anxiety    Arthritis    Bipolar 2 disorder (HCC) 08/18/2011   Diagnosed in 30-Sep-2010.    Bipolar disorder (HCC)    Blood dyscrasia    Nephrotic Anemia   Blood transfusion    Blood transfusion without reported diagnosis    Chronic diastolic CHF (congestive heart failure) (HCC)    Complication of anesthesia    Woke up intubated gets combative  afraid to be alone    Congestive heart failure (CHF) (HCC) 07/13/2011   Onset 2005-09-30.  Followed by Dr. Eden Emms.  S/p cardiac catheterization in [facility] Dr. Anne Fu..  S/p cardiac catheterization in 09-30-13 by Nishan.  Echo 2013/09/30.    Coronary artery disease    Depression    Dysrhythmia    A. Fib 10-01-2023 with DCCV in May 2024   Erectile dysfunction    ESRD on hemodialysis (HCC) 07/13/2011   Glomerulonephritis at age 67, started HD in 10-01-95.  Deceased donor renal transplant 09/30/1998 at [facility], then kidney failed and went back on HD in 2005/09/30.  L forearm AVF . s/p repeat renal transplant 10/2014 [facility].   GI bleed 11/01/2011   Rectal  bleeding with emesis/diarrhea    Heart murmur    Hemodialysis status (HCC)    HSV (herpes simplex virus) infection    Hydrocele, right 11/25/2014   Hypertension    Hypertensive emergency 01/06/2013   Hypoglycemia 01/11/2013   Influenza-like illness 12/18/2013   Mild ascending aorta dilatation (HCC)    NICM (nonischemic cardiomyopathy) (HCC)    Pericardial effusion    a. Mod by echo in 09/30/13, similar to prior.   Peripheral polyneuropathy 08/04/2016   Pneumonia    Pulmonary edema 01/06/2013   Pulmonary hypertension associated with end stage renal disease on dialysis [facility]) 07/13/2011   Renal transplant, status post 11/25/2014   Pt with Glomerulonephritis at age 42. Deceased donor renal transplant 1998-09-30 right and left renal transplant 10/2014.  Followed by [facility] transplant team; nephrologist is Dr. Lonna Cobb.    Respiratory failure with hypoxia (HCC) 01/06/2013   Shortness of breath 12/12/2012   Sleep apnea    Swollen testicle 07/13/2011   URI (upper respiratory infection) 01/25/2012    Significant Hospital Events: Including procedures, antibiotic start and stop dates in addition to other pertinent events   8/15 - AVR, MVR, TVR with TCTS ([facility]). Remains on multiple pressors (NE, Epi, vaso). 8/16 - [facility] Trialysis catheter placed for CRRT.  8/17 extubated  8/18 - remains on CVVHD, 3 pressors  8/19-8/20 improving pressor needs 8/21 agitated delirium; cortrak 8/26 CRRT restarted 8/27 TVP placed, brady overnight and pacing turned to 80 8/30 palli consult  9/1 PRBC for low coox  9/2 coox down a bit again  9/3CRRT off overnight due to access issues. Remains on vaso 0.04, epi 10, norepi 2, DBA 5. 9/4 RHC, started iNO 9/6 repeat RHC, fistula ligated 9/9 Remains on vaso 0.04, epi . 30 midodrine TID, Temp pacer, small effusions on CXR , mild interstitial edema, left lower lobe consolidation most likely atelectasis , afebrile Interim History / Subjective:    Remains on vaso  0.04, Epi has just been turned off, . 30 midodrine TID. He denies complaints today other than coughing more Increase in secretions , receiving Chest PT. Weak cough Co ox 87.5 Net negative 7.8 L CVP 9-11  Objective:  Blood pressure (!) 92/35, pulse 82, temperature 97.9 F (36.6 C), temperature source Oral, resp. rate (!) 23, height 6\' 3"  (1.905 m), weight 76.1 kg, SpO2 100%. CVP:  [7 mmHg-19 mmHg] 13 mmHg      Intake/Output Summary (Last 24 hours) at 07/17/2023 0907 Last data filed at 07/17/2023 0800 Gross per 24 hour  Intake 1351.39 ml  Output 929.1 ml  Net 422.29 ml   Filed Weights   07/15/23 0500 07/16/23 0500 07/17/23 0500  Weight: 75.8 kg 75.2 kg 76.1 kg   Physical Examination:    General: chronically ill appearing man lying in bed  awake, weak HEENT: temporal wasting, oral mucosa pink and dry CV: S1S2, RRR, paced rhythm ( Temp pacer) Pulm: Bilateral chest excursion , breathing comfortably on 4 L  [place], iNO, few rales per bases , diminished per bases. Wet-sounding weak cough GI: soft, NT, ND, BS +, Body mass index is 20.97 kg/m.  Derm: warm, dry, no rashes, no lesions  MSK: muscle wasting, no edema, globally weak Neuro: arouses to verbal stimulation, quiet voice, globally weak.   Na 133, K 4.5,  BUN 21, Cr1.56 on CRRT Calcium 11.2 Mag 2.5 Coox 87.5% CXR : cardiomegaly, small pleural effusions ,mild interstitial edema left lower lobe consolidation  WBC 6.1, HGB 7.3 HCT 25, platelets 236 T Max 98.1  Net negative 7.9 L  Resolved issues:  Possible PNA. Right pleural effusion - s/p pigtail chest tube with subsequent removal 8/25.   Assessment & Plan:   ICU delirium, acute encephalopathy - improving.  -delirium precautions -avoid sedating meds -unfortunately mobility is restricted with CRRT-dependence -con't lexapro and seroquel QHS  Acute respiratory failure with hypoxia & hypercapnia -wean iNO slowly -supplemental O2 to maintain SpO2 >90% -BiPAP PRN -CRRT  for volume management -pulmonary hygiene -CXR 9/10 am to evaluate effusions , interstitial edema and LLL consolidation   MVR, TVR, AS s/p bioprosthetic replacement CHB/ post op junctional rhythm s/p TVP Jul 18, 2023) Cardiogenic shock, RV failure, high output heart failure Enlarging pericardial effusion w/o tamponade AoC HFrEF> currently EF recovered Afib s/p MAZE  -post op distributive/vasoplegia, improved. Possible sepsis now off abx. Has RV failure. Pericardial effusion without tamponade physiology P -appreciate TCTS management -con't weaning inotropes and pressors to maintain SBP >95 -anticipate he needs PPM eventually, once stable enough for procedure -high dose midodrine, 30 mg TID  ESRD; CKD s/p failed renal transplants x 2 (1999, 2015)  Hypophosphatemia Hyponatremia  Hypokalemia  Hypercalcemia  Hypermagnesemia  History of kidney disease status post renal transplant (failed 1999 and 2015). P -CRRT until able to tolerate iHD; on  Vaso 0.04 -fistula ligated, will need permacath eventually -strict I/O  -renally dose meds, avoid nephrotoxic meds  H/o HSV -con't acyclovir, PTA med  Anemia - expected ABLA (resolved), critical illness, chronic dz  -transfuse  for Hb <7 or hemodynamically significant bleeding -aranesp per nephro  - Trend CBC  Severe protein energy malnutrition  -con't TF, can eat as he can tolerate - Will d/c Reglan 9/9  Possible gout flare -con't colchicine  Sore throat -magic mouthwash  Physical deconditioning  -needs aggressive PT, OT, SLP; CRRT complicates this. -anticipate he will need CIR once he is liberated from CVVH, and off pressors  GOC -Family had been updated 9/9. Full aggressive care desired.     Best Practice (right click and "Reselect all SmartList Selections" daily)   Diet/type:TF  DVT prophylaxis:heparin GI prophylaxis: PPI Lines: Central line, Dialysis Catheter, and Arterial Line Foley:  No  Code Status:  full code Last date  of multidisciplinary goals of care discussion [Per Primary Team]  This patient is critically ill with multiple organ system failure which requires frequent high complexity decision making, assessment, support, evaluation, and titration of therapies. This was completed through the application of advanced monitoring technologies and extensive interpretation of multiple databases. During this encounter critical care time was devoted to patient care services described in this note for 40 minutes.  Bevelyn Ngo, AGACNP-BC 07/17/23 9:07 AM [facility]  For contact information, see Amion. If no response to pager, please call PCCM consult pager. After hours, 7PM- 7AM, please call Elink.

## 2023-07-17 NOTE — Plan of Care (Signed)
  Problem: Education: Goal: Knowledge of General Education information will improve Description: Including pain rating scale, medication(s)/side effects and non-pharmacologic comfort measures Outcome: Progressing   Problem: Health Behavior/Discharge Planning: Goal: Ability to manage health-related needs will improve Outcome: Progressing   Problem: Clinical Measurements: Goal: Ability to maintain clinical measurements within normal limits will improve Outcome: Progressing Goal: Will remain free from infection Outcome: Progressing Goal: Diagnostic test results will improve Outcome: Progressing Goal: Respiratory complications will improve Outcome: Progressing Goal: Cardiovascular complication will be avoided Outcome: Progressing   Problem: Activity: Goal: Risk for activity intolerance will decrease Outcome: Progressing   Problem: Nutrition: Goal: Adequate nutrition will be maintained Outcome: Progressing   Problem: Coping: Goal: Level of anxiety will decrease Outcome: Progressing   Problem: Elimination: Goal: Will not experience complications related to bowel motility Outcome: Progressing   Problem: Pain Managment: Goal: General experience of comfort will improve Outcome: Progressing   Problem: Safety: Goal: Ability to remain free from injury will improve Outcome: Progressing   Problem: Skin Integrity: Goal: Risk for impaired skin integrity will decrease Outcome: Progressing   Problem: Education: Goal: Knowledge of disease or condition will improve Outcome: Progressing Goal: Knowledge of the prescribed therapeutic regimen will improve Outcome: Progressing   Problem: Activity: Goal: Risk for activity intolerance will decrease Outcome: Progressing   Problem: Cardiac: Goal: Will achieve and/or maintain hemodynamic stability Outcome: Progressing   Problem: Clinical Measurements: Goal: Postoperative complications will be avoided or minimized Outcome:  Progressing   Problem: Respiratory: Goal: Respiratory status will improve Outcome: Progressing   Problem: Skin Integrity: Goal: Wound healing without signs and symptoms of infection Outcome: Progressing Goal: Risk for impaired skin integrity will decrease Outcome: Progressing   Problem: Urinary Elimination: Goal: Ability to achieve and maintain adequate renal perfusion and functioning will improve Outcome: Progressing   Problem: Education: Goal: Understanding of CV disease, CV risk reduction, and recovery process will improve Outcome: Progressing Goal: Individualized Educational Video(s) Outcome: Progressing   Problem: Activity: Goal: Ability to return to baseline activity level will improve Outcome: Progressing   Problem: Cardiovascular: Goal: Ability to achieve and maintain adequate cardiovascular perfusion will improve Outcome: Progressing Goal: Vascular access site(s) Level 0-1 will be maintained Outcome: Progressing   Problem: Health Behavior/Discharge Planning: Goal: Ability to safely manage health-related needs after discharge will improve Outcome: Progressing   Problem: Education: Goal: Ability to describe self-care measures that may prevent or decrease complications (Diabetes Survival Skills Education) will improve Outcome: Progressing Goal: Individualized Educational Video(s) Outcome: Progressing   Problem: Coping: Goal: Ability to adjust to condition or change in health will improve Outcome: Progressing   Problem: Fluid Volume: Goal: Ability to maintain a balanced intake and output will improve Outcome: Progressing   Problem: Health Behavior/Discharge Planning: Goal: Ability to identify and utilize available resources and services will improve Outcome: Progressing Goal: Ability to manage health-related needs will improve Outcome: Progressing   Problem: Metabolic: Goal: Ability to maintain appropriate glucose levels will improve Outcome:  Progressing   Problem: Nutritional: Goal: Maintenance of adequate nutrition will improve Outcome: Progressing Goal: Progress toward achieving an optimal weight will improve Outcome: Progressing   Problem: Skin Integrity: Goal: Risk for impaired skin integrity will decrease Outcome: Progressing   Problem: Tissue Perfusion: Goal: Adequacy of tissue perfusion will improve Outcome: Progressing

## 2023-07-17 NOTE — Progress Notes (Signed)
Occupational Therapy Treatment Patient Details Name: [name] MRN: [id number] DOB: May 14, 1971 Today's Date: 07/17/2023   History of present illness Pt is a 52 y.o. male admitted 07/06/2023 for same day planned tricuspid valve repair, mitral valve repair, aortic valve replacement, maze, TEE. Post-op course complicated by CHB, shock, AKI, respiratory failure. CRRT initiated 8/16, then resumed 8/26. ETT 8/15-8/17. RHC 9/4 and 07-29-2023. PMH includes CHF, HTN, NICM, polyneuropathy, ESRD on HD (s/p renal transplant 1999, 2015), bipolar, ADHD, anxiety, depression.   OT comments  Pt with slow progression towards goals, limited to bed level exercise/ADLs this session. Pt able to participate in bed level bathing and grooming tasks, min -mod A for UB ADLs and grooming, max A +2 for rolling for bed linen change. Pt frequently keeping eyes closed during session, however following commands with incr time. Pt participating in bed level UE/LE therex. Pt presenting with impairments listed below, will follow acutely. Patient will benefit from intensive inpatient follow up therapy, >3 hours/day to maximize safety/ind with ADLs/functional mobility.       If plan is discharge home, recommend the following:  Two people to help with walking and/or transfers;A lot of help with bathing/dressing/bathroom;Assistance with cooking/housework;Direct supervision/assist for financial management;Direct supervision/assist for medications management;Assist for transportation;Help with stairs or ramp for entrance   Equipment Recommendations  Other (comment) (defer)    Recommendations for Other Services PT consult    Precautions / Restrictions Precautions Precautions: Fall;Sternal Precaution Booklet Issued: No Precaution Comments: CRRT, external pacer in neck Restrictions RUE Weight Bearing: Non weight bearing LUE Weight Bearing: Non weight bearing Other Position/Activity Restrictions: sternal prec       Mobility Bed  Mobility Overal bed mobility: Needs Assistance Bed Mobility: Rolling Rolling: Max assist, +2 for physical assistance              Transfers                   General transfer comment: deferred     Balance                                           ADL either performed or assessed with clinical judgement   ADL Overall ADL's : Needs assistance/impaired     Grooming: Oral care;Bed level;Minimal assistance Grooming Details (indicate cue type and reason): oral care at bed level, assist for managing brush, cup and basin Upper Body Bathing: Minimal assistance;Bed level Upper Body Bathing Details (indicate cue type and reason): min A for assist to manage lines/wires     Upper Body Dressing : Moderate assistance;Bed level                          Extremity/Trunk Assessment Upper Extremity Assessment Upper Extremity Assessment: Generalized weakness   Lower Extremity Assessment Lower Extremity Assessment: Defer to PT evaluation        Vision   Vision Assessment?: No apparent visual deficits   Perception Perception Perception: Not tested   Praxis Praxis Praxis: Not tested    Cognition Arousal: Lethargic Behavior During Therapy: Flat affect Overall Cognitive Status: Within Functional Limits for tasks assessed                                 General Comments: keeps eyes closed frequently during session, but following  commands with incr time        Exercises General Exercises - Upper Extremity Shoulder Flexion: Supine, Both, 10 reps, AROM (limited to 90* shoulder flexion) Elbow Flexion: Supine, Both, 10 reps, AROM Elbow Extension: AROM, Both, 10 reps, Supine General Exercises - Lower Extremity Ankle Circles/Pumps: AROM, 10 reps, Both, Supine Hip Flexion/Marching: AROM, Both, Supine Heel Raises: AROM, Both, 15 reps, Supine Other Exercises Other Exercises: squeeze ball x5 BUE    Shoulder Instructions        General Comments VSS, RN present to assist    Pertinent Vitals/ Pain       Pain Assessment Pain Assessment: Faces Pain Score: 4  Faces Pain Scale: Hurts little more Pain Location: back Pain Descriptors / Indicators: Discomfort, Grimacing Pain Intervention(s): Limited activity within patient's tolerance, Monitored during session, Repositioned  Home Living                                          Prior Functioning/Environment              Frequency  Min 1X/week        Progress Toward Goals  OT Goals(current goals can now be found in the care plan section)  Progress towards OT goals: Progressing toward goals  Acute Rehab OT Goals Patient Stated Goal: none stated OT Goal Formulation: With patient Time For Goal Achievement: 07/25/23 Potential to Achieve Goals: Good ADL Goals Pt Will Perform Upper Body Dressing: with min assist;sitting Pt Will Perform Lower Body Dressing: with min assist;sit to/from stand;sitting/lateral leans Pt Will Transfer to Toilet: with min assist;bedside commode;squat pivot transfer;stand pivot transfer  Plan      Co-evaluation                 AM-PAC OT "6 Clicks" Daily Activity     Outcome Measure   Help from another person eating meals?: A Little Help from another person taking care of personal grooming?: A Little Help from another person toileting, which includes using toliet, bedpan, or urinal?: A Lot Help from another person bathing (including washing, rinsing, drying)?: A Lot Help from another person to put on and taking off regular upper body clothing?: A Lot Help from another person to put on and taking off regular lower body clothing?: A Lot 6 Click Score: 14    End of Session    OT Visit Diagnosis: Unsteadiness on feet (R26.81);Other abnormalities of gait and mobility (R26.89);Muscle weakness (generalized) (M62.81)   Activity Tolerance Patient tolerated treatment well   Patient Left in bed;with call  bell/phone within reach;with bed alarm set;with nursing/sitter in room   Nurse Communication Mobility status        Time: 0347-4259 OT Time Calculation (min): 31 min  Charges: OT General Charges $OT Visit: 1 Visit OT Treatments $Self Care/Home Management : 8-22 mins $Therapeutic Exercise: 8-22 mins  Carver Fila, OTD, OTR/L SecureChat Preferred Acute Rehab [phone]   Carver Fila Koonce 07/17/2023, 4:55 PM

## 2023-07-17 NOTE — Progress Notes (Addendum)
Cortrak Tube Team Note:  Consult received to unclog Cortrak tube, issues with flushing and medication administration.   Per discussion with RN this AM, tube was able to be unclogged last night and tube feeds have been running well this AM.   If the tube becomes dislodged please keep the tube and contact the Cortrak team at [email or web address] for replacement.  If after hours and replacement cannot be delayed, place a NG tube and confirm placement with an abdominal x-ray.    Shelle Iron RD, LDN For contact information, refer to [facility].

## 2023-07-17 NOTE — Progress Notes (Signed)
   07/17/23 1108  Spiritual Encounters  Type of Visit Initial  Care provided to: Mannie Stabile partners present during encounter Nurse  Referral source Patient request  Reason for visit Routine spiritual support   Chaplain responded to Spiritual Consult- Pt seeking prayer.  Chaplain arrived in the room to find friend in the room with Pt, but Pt was sleeping and very difficult to awaken.  Chaplain told friend not to worry about waking him, I could come back another time. Spoke with friend briefly- he has know Pt approx 25 years- they are fraternity brothers from college days.  Chaplain services remain available by Spiritual Consult or for emergent cases, paging [phone]  Chaplain Raelene Bott, MDiv [email or web address] [phone]

## 2023-07-17 NOTE — Progress Notes (Signed)
[address]       [place] [address]             [phone]      3 Days Post-Op  Procedure(s) (LRB): LIGATION OF RIGHT ARTERIOVENOUS  FISTULA (Right)   Total Length of Stay:  LOS: 25 days    SUBJECTIVE: Very weak Has no complaints  Vitals:   07/17/23 0600 07/17/23 0700  BP:    Pulse: 82 82  Resp: (!) 23 (!) 24  Temp:    SpO2: 99% 100%    Intake/Output      09/08 0701 09/09 0700 09/09 0701 09/10 0700   P.O.     I.V. (mL/kg) 635.4 (8.3)    NG/GT 818.3    Total Intake(mL/kg) 1453.7 (19.1)    Stool 135    CRRT 495.1    Total Output 630.1    Net +823.6              prismasol BGK 4/2.5 400 mL/hr at 07/16/23 1534    prismasol BGK 4/2.5 400 mL/hr at 07/16/23 0604   sodium chloride 10 mL/hr at 07/17/23 0700   sodium chloride 10 mL/hr at 07/27/2023 1400   albumin human Stopped (06/23/23 0040)   epinephrine 2 mcg/min (07/17/23 0700)   feeding supplement (PIVOT 1.5 CAL) 70 mL/hr at 07/17/23 0700   norepinephrine (LEVOPHED) Adult infusion Stopped (07/13/23 0720)   prismasol BGK 4/2.5 1,800 mL/hr at 07/17/23 0452   vasopressin 0.04 Units/min (07/17/23 0700)    CBC    Component Value Date/Time   WBC 6.1 07/17/2023 0455   RBC 2.26 (L) 07/17/2023 0455   HGB 7.3 (L) 07/17/2023 0455   HCT 25.0 (L) 07/17/2023 0455   PLT 236 07/17/2023 0455   MCV 110.6 (H) 07/17/2023 0455   MCH 32.3 07/17/2023 0455   MCHC 29.2 (L) 07/17/2023 0455   RDW 20.1 (H) 07/17/2023 0455   LYMPHSABS 2.2 03/15/2020 0947   MONOABS 0.5 03/15/2020 0947   EOSABS 0.2 03/15/2020 0947   BASOSABS 0.0 03/15/2020 0947   CMP     Component Value Date/Time   NA 133 (L) 07/17/2023 0454   K 4.5 07/17/2023 0454   CL 102 07/17/2023 0454   CO2 26 07/17/2023 0454   GLUCOSE 130 (H) 07/17/2023 0454   BUN 18 07/17/2023 0454   CREATININE 1.56 (H) 07/17/2023 0454   CALCIUM 11.2 (H) 07/17/2023 0454   CALCIUM 9.8 02/18/2011 1007   PROT 8.1 07/05/2023 0452   ALBUMIN 2.3 (L)  07/17/2023 0454   AST 46 (H) 07/05/2023 0452   ALT 13 07/05/2023 0452   ALKPHOS 78 07/05/2023 0452   BILITOT 1.0 07/05/2023 0452   GFRNONAA 54 (L) 07/17/2023 0454   GFRAA 10 (L) 03/16/2020 0825   ABG    Component Value Date/Time   PHART 7.317 (L) 07/11/2023 1346   PCO2ART 48.2 (H) 07/11/2023 1346   PO2ART 103 07/11/2023 1346   HCO3 27.9 07/25/2023 1038   HCO3 27.6 07/13/2023 1038   TCO2 29 07/15/2023 1038   TCO2 29 07/18/2023 1038   ACIDBASEDEF 1.0 08/01/2023 1517   O2SAT 87.5 07/17/2023 0530   CBG (last 3)  Recent Labs    07/16/23 1918 07/16/23 2325 07/17/23 0000  GLUCAP 77 67* 93  EXAM Lungs: decreased Card: RR  Ext: warm Neuro: awake responds slowly   ASSESSMENT: SP AVR/MVTV repair MAZE Has been able to wean some on epi. Wean for SBP > 95. Weaning NO. Will check echo  today Renal: still on CRRT. Positive on I&Os yesterday Need aggressive PT  Cont tube feeds for now.  Start coumadin   Eugenio Hoes, MD 07/17/2023

## 2023-07-17 NOTE — Inpatient Diabetes Management (Signed)
Inpatient Diabetes Program Recommendations  AACE/ADA: New Consensus Statement on Inpatient Glycemic Control (2015)  Target Ranges:  Prepandial:   less than 140 mg/dL      Peak postprandial:   less than 180 mg/dL (1-2 hours)      Critically ill patients:  140 - 180 mg/dL   Lab Results  Component Value Date   GLUCAP 111 (H) 07/17/2023   HGBA1C 5.3 06/20/2023    Latest Reference Range & Units 07/16/23 07:59 07/16/23 12:23 07/16/23 15:44 07/16/23 19:18 07/16/23 23:25 07/17/23 00:00 07/17/23 07:39  Glucose-Capillary 70 - 99 mg/dL 696 (H) 295 (H) 284 (H) 77 67 (L) 93 111 (H)  (H): Data is abnormally high (L): Data is abnormally low   Inpatient Diabetes Program Recommendations:   Please consider: -Decrease Novolog correction to 0-9 units q 4 hrs.  Thank you, Billy Fischer. Troy Kanouse, RN, MSN, CDE  Diabetes Coordinator Inpatient Glycemic Control Team Team Pager [phone] (8am-5pm) 07/17/2023 10:14 AM

## 2023-07-17 NOTE — Progress Notes (Signed)
Advanced Heart Failure Rounding Note  PCP-Cardiologist: Reatha Harps, MD   Subjective:   06/23/23: Elective triple vale surgery + MAZE. Underwent aortic valve replacement + mitral and tricuspid valve repairs by Dr. Milinda Antis 8/22: AHF consulted for high-output HF with persistent post-op shock 8/25: CRRT stopped 8/26: CRRT restarted  8/27: S/p TTVP Echo: EF 70-75%, severe LVH, RV mod reduced, RV severely enlarged. LA/RA mod dilated. Large pericardial effusion. No evidence of tamponade.  9/4: RHC c/w severe PAH with normal output and low SVR. PVR 4-5. SBP improved nearly 20 points with AVF occlusion.  9/6 Repeat RHC with subsequent ligation of AVF SVR ~500 despite high-dose pressors   Overall on less pressor requirements post ligation, though not much change last 24 hrs. Remains on Epi 3 + VP 0.04 + midodrine 30 tid. Remains on iNO at 15. SBPs ~100.   On CVVHD keeping even. CVP 11.   Hgb 7.3   C/w lots of secretions. Getting chest PT.  AF. WBC nl.   Remains weak, lethargic appearing but will awake and follow commands.    Objective:    Weight Range: 76.1 kg Body mass index is 20.97 kg/m.   Vital Signs:   Temp:  [97.9 F (36.6 C)-98.1 F (36.7 C)] 98 F (36.7 C) (09/08 2300) Pulse Rate:  [82] 82 (09/09 0700) Resp:  [13-32] 24 (09/09 0700) SpO2:  [98 %-100 %] 100 % (09/09 0700) Arterial Line BP: (88-125)/(30-49) 100/39 (09/09 0700) Weight:  [76.1 kg] 76.1 kg (09/09 0500) Last BM Date : 07/16/23  Weight change: Filed Weights   07/15/23 0500 07/16/23 0500 07/17/23 0500  Weight: 75.8 kg 75.2 kg 76.1 kg   Intake/Output:   Intake/Output Summary (Last 24 hours) at 07/17/2023 0806 Last data filed at 07/17/2023 0700 Gross per 24 hour  Intake 1353.95 ml  Output 594.1 ml  Net 759.85 ml    Physical Exam   CVP 11  General:  fatigued looking, chronically ill and cachetic appearing. No respiratory difficulty HEENT: normal + cor trak  Neck: supple. JVD 12 cm. Carotids 2+  bilat; no bruits. No lymphadenopathy or thyromegaly appreciated. Cor: PMI nondisplaced. Regular rate & rhythm. 2/6 TR  Lungs: course anteriorly bilaterally  Abdomen: soft, nontender, nondistended. No hepatosplenomegaly. No bruits or masses. Good bowel sounds. Extremities: no cyanosis, clubbing, rash, edema Neuro: alert & oriented x 3, cranial nerves grossly intact. moves all 4 extremities w/o difficulty. Affect flat   Telemetry   V paced 80s. Personally reviewed  Labs    CBC Recent Labs    07/16/23 0324 07/17/23 0455  WBC 7.1 6.1  HGB 7.9* 7.3*  HCT 26.7* 25.0*  MCV 113.1* 110.6*  PLT 240 236   Basic Metabolic Panel Recent Labs    69/62/95 0324 07/16/23 1600 07/17/23 0454  NA 133* 132* 133*  K 4.4 4.9 4.5  CL 98 101 102  CO2 27 26 26   GLUCOSE 143* 117* 130*  BUN 21* 24* 18  CREATININE 1.60* 1.73* 1.56*  CALCIUM 11.1* 11.1* 11.2*  MG 2.6*  --  2.5*  PHOS 2.1* 2.6 2.7   Liver Function Tests Recent Labs    07/16/23 1600 07/17/23 0454  ALBUMIN 2.4* 2.3*   No results for input(s): "LIPASE", "AMYLASE" in the last 72 hours. Cardiac Enzymes No results for input(s): "CKTOTAL", "CKMB", "CKMBINDEX", "TROPONINI" in the last 72 hours.  BNP: BNP (last 3 results) No results for input(s): "BNP" in the last 8760 hours.  ProBNP (last 3 results) No results  for input(s): "PROBNP" in the last 8760 hours.  D-Dimer No results for input(s): "DDIMER" in the last 72 hours. Hemoglobin A1C No results for input(s): "HGBA1C" in the last 72 hours. Fasting Lipid Panel No results for input(s): "CHOL", "HDL", "LDLCALC", "TRIG", "CHOLHDL", "LDLDIRECT" in the last 72 hours. Thyroid Function Tests No results for input(s): "TSH", "T4TOTAL", "T3FREE", "THYROIDAB" in the last 72 hours.  Invalid input(s): "FREET3"  Other results:  Imaging   DG Abd Portable 1V  Result Date: 07/17/2023 CLINICAL DATA:  [id number].  Encounter for feeding tube placement. EXAM: PORTABLE ABDOMEN - 1 VIEW  COMPARISON:  Similar study 07/09/2023. FINDINGS: Feeding tube again noted in place with the weighted tip in the distal stomach. Positioning was similar on the previous exam. The pelvis was not included in the study. The visualized bowel pattern is nonobstructive. Again noted is a tangle of small caliber wires superimposed horizontally across the upper abdomen continuing into the chest base most likely epicardial pacing wiring. There are cholecystectomy clips.  No supine evidence of free air. There is a transvenous pacing wire terminating in the right ventricle of the heart. Global cardiomegaly is again noted with sternotomy sutures and 3 prosthetic heart valves. There are small pleural effusions and least mild interstitial edema in the bases with patchy left lower lobe consolidation. Similar findings on this morning's chest x-ray. IMPRESSION: 1. Feeding tube weighted tip in the distal stomach. 2. Cardiomegaly, small pleural effusions and at least mild interstitial edema in the bases with patchy left lower lobe consolidation. Similar findings on this morning's chest x-ray. Electronically Signed   By: Almira Bar M.D.   On: 07/17/2023 00:35   DG CHEST PORT 1 VIEW  Result Date: 07/16/2023 CLINICAL DATA:  Status post tricuspid valve repair. EXAM: PORTABLE CHEST 1 VIEW COMPARISON:  July 09, 2023 FINDINGS: Enlarged cardiac silhouette. Interstitial pulmonary edema. Curvilinear tubing extends from the left neck and overlies the upper chest curving on itself, position or etiology uncertain. The remainder of the support apparatus and postsurgical changes are stable. IMPRESSION: 1. Curvilinear tubing extends from the left neck and overlies the upper chest curving on itself, position or etiology uncertain. 2. Interstitial pulmonary edema. 3. Significantly enlarged cardiac silhouette. Electronically Signed   By: Ted Mcalpine M.D.   On: 07/16/2023 11:39    Medications:     Scheduled Medications:  acyclovir   200 mg Per Tube BID   aspirin  81 mg Per Tube Daily   atorvastatin  10 mg Per Tube QHS   azelastine  2 spray Each Nare BID   Chlorhexidine Gluconate Cloth  6 each Topical Daily   cinacalcet  30 mg Oral Q supper   colchicine  0.3 mg Per Tube Once every 5 days   darbepoetin (ARANESP) injection - DIALYSIS  150 mcg Subcutaneous Q Tue-1800   docusate  100 mg Per Tube BID   escitalopram  10 mg Per Tube Daily   folic acid  1 mg Per Tube Daily   guaiFENesin  15 mL Per Tube BID   heparin injection (subcutaneous)  5,000 Units Subcutaneous Q8H   [START ON 07/20/2023] influenza vac split trivalent PF  0.5 mL Intramuscular Once   insulin aspart  0-24 Units Subcutaneous Q4H   lidocaine  1 patch Transdermal Q24H   lidocaine-prilocaine  1 Application Topical Q M,W,F   lipase/protease/amylase)  20,880 Units Per Tube Once   And   sodium bicarbonate  650 mg Per Tube Once   metoCLOPramide  2.5 mg Per  Tube BID   midodrine  30 mg Per Tube TID   multivitamin  1 tablet Per Tube BID   mouth rinse  15 mL Mouth Rinse 4 times per day   pantoprazole (PROTONIX) IV  40 mg Intravenous Q12H   polyethylene glycol  17 g Per Tube Daily   QUEtiapine  12.5 mg Per Tube QHS   sodium chloride flush  3 mL Intravenous Q12H   thiamine  100 mg Oral Daily    Infusions:   prismasol BGK 4/2.5 400 mL/hr at 07/16/23 1534    prismasol BGK 4/2.5 400 mL/hr at 07/16/23 0604   sodium chloride 10 mL/hr at 07/17/23 0700   sodium chloride 10 mL/hr at 07/21/2023 1400   albumin human Stopped (06/23/23 0040)   epinephrine 2 mcg/min (07/17/23 0700)   feeding supplement (PIVOT 1.5 CAL) 70 mL/hr at 07/17/23 0700   norepinephrine (LEVOPHED) Adult infusion Stopped (07/13/23 0720)   prismasol BGK 4/2.5 1,800 mL/hr at 07/17/23 0452   vasopressin 0.04 Units/min (07/17/23 0700)    PRN Medications: sodium chloride, sodium chloride, acetaminophen, albumin human, bisacodyl **OR** bisacodyl, clonazepam, heparin, magic mouthwash, ondansetron  (ZOFRAN) IV, mouth rinse, oxyCODONE, phenol, simethicone, sodium chloride flush  Patient Profile   52 y/o AAM w/ mildly reduced systolic heart failure (EF once 45-50% in 2013 but normal on subsequent echos) valvular heart disease including severe AS, 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 EF 55-60% w/ mild RV dysfunction by echo in 6/24.    Admitted for elective triple vale surgery + MAZE. S/p bioprosthetic aortic valve replacement + mitral and tricuspid valve repairs. Post-op course c/b persistent vasoplagic syndrome.   Assessment/Plan   1. Post-op Vasopalegia/high-output HF - Echo 06/29/23 EF 60-65%, no RWMA, small pericardial effusion present, MV repaired/replaced, trivial MR. TR repaired/replaced, mild TR. AV repaired/replaced.  - Echo 06/11/2023: EF 70-75%, severe concentric LVH, RV mod reduced, RV severely enlarged. LA/RA mod dilated. Large pericardial effusion. No evidence of tamponade - Echo 08/29: Moderate pericardial effusion, no tamponade - RHC c/w high output HF physiology 2/2 AV fistula despite severe RV dysfunction  - s/p AVF ligation on 9/6 - Continue to wean pressors for SBP 95  - CVP 11. Targeting CVP 12-13 d/t severe RV failure. Keep even on CRRT today. D/w RN at bedside    2.  Acute on Chronic Systolic Heart Failure w/ Prominent RV Dysfunction  - LVEF once 45-50% in 2013 but normal on subsequent echos. Pre-op TTE 6/25 EF 55-60%, GIIIDD (Restrictive) RV mildly reduced, mod PH w/ estimated RVSP 48 mmHg  - Intra-op TEE (while on 4 mcg of Epi) LVEF 45%, RV mod-severely reduced, stable AV prosthesis w/ normal gradient and stable valvuloplasty rings w/ mod residual MS, mean gradient 7 mmgH.   - echo 06/09/2023: EF 70-75%, severe concentric LVH, RV mod reduced, RV severely enlarged. LA/RA mod dilated. Large pericardial effusion. No evidence of  tamponade.  - Echo 08/29: EF 65-70%, RV moderately reduced, pericardial effusion slightly smaller, no tamponade - RHC c/w high output HF physiology with very high pulmonary pressures 2/2 AV fistula despite severe RV dysfunction - s/p Fistula ligation 9/6   - Wean epi as tolerated today for SBP >= 90   - Continue midodrine and VP.  - Cont Midodrine 30 mg TID - ? Wean iNO ->15->10. Defer to MD  - Repeat echo   3.  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 Post-op CHB  - EP following. - S/p RIJ temp wire 08/27 - EPW left in place d/t concern for risk of tamponade - V paced 80.    5. 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 Nephrology following.  - Plan as above   6. H/o Atrial Fibrillation  - S/p MAZE    7. Large pericardial effusion - seen on echo 06/11/2023. Previously small on echo 06/29/23 - no evidence of tamponade - Limited echo 08/29 stable - Repeat echo today   8. Severe PAH - pre-op PA pressures 58/30. With PVR 1.2 CO 10L  - post-op PA 97/33 (55) PCWP 30 PVR 4.2 CO 7L - suspect combination of valvular heart disease and high-output physiology - should improved with AVF ligation - ?Wean iNO today 15->10   9. GOC - palliative care initially following. Now signed off 9/2 per patient request - remains full code   Length of Stay: [address], PA-C  07/17/2023, 8:06 AM  Advanced Heart Failure Team Pager [phone] (M-F; 7a - 5p)  Please contact [facility] for night-coverage after hours (5p -7a ) and weekends on [email or web address]

## 2023-07-17 NOTE — Progress Notes (Signed)
Speech Language Pathology Treatment: Dysphagia  Patient Details Name: [name] MRN: [id number] DOB: Aug 25, 1971 Today's Date: 07/17/2023 Time: 4098-1191 SLP Time Calculation (min) (ACUTE ONLY): 22 min  Assessment / Plan / Recommendation Clinical Impression  Pt awake this pm, sustained arousal throughout session, occasionally verbalizing upon SLP request. Pt states he doesn't talk much because it hurts to talk. He is dysphonic with low volume. Pt tolerates ice, requests warm tea so SLP offered him a few tastes of warm, sweet tea which resulted in grimaced multiple swallows, coughing. Hopful that pt is clearing some pharyngeal secretions with this. Aspiration risk low given very small quantity. Ok for RN to give an occasional teaspoon of warm liquid. Plan for FEES tomorrow pm, seems like pt more alert then.   HPI HPI: This is a 52 year old gentleman, Patient underwent surgery 8/15  for severe mitral regurgitation severe tricuspid regurgitation and moderate aortic stenosis.  He also has atrial fibrillation received maze procedure.  He had a complex mitral valve repair a tricuspid valve annuloplasty and aortic valve replacement followed by full right and left maze procedure. Extubated 8/17. past medical history of tracheostomy in 2011 (reason unknown, pt reports "real bad pna"), anxiety depression, bipolar disease, congestive heart failure chronic diastolic heart failure kidney disease followed by renal transplant in 1999 repeat renal transplant in 2015.      SLP Plan  Continue with current plan of care      Recommendations for follow up therapy are one component of a multi-disciplinary discharge planning process, led by the attending physician.  Recommendations may be updated based on patient status, additional functional criteria and insurance authorization.    Recommendations  Diet recommendations: NPO;Other(comment) (except for tastes of warm tea or ice chips) Liquids provided via:  Teaspoon Medication Administration: Via alternative means                  Oral care BID           Continue with current plan of care     Emnet Monk, Riley Nearing  07/17/2023, 2:16 PM

## 2023-07-17 NOTE — Progress Notes (Signed)
SLP Cancellation Note  Patient Details Name: [name] MRN: [id number] DOB: July 16, 1971   Cancelled treatment:       Reason Eval/Treat Not Completed: Patient not medically ready. Too lethargic for FEES this am. Will continue efforts    Amaar Oshita, Riley Nearing 07/17/2023, 10:23 AM

## 2023-07-17 NOTE — Progress Notes (Addendum)
[facility] Progress Note  Subjective:  Still on epi, dose down.  On VP UF based upon CVP 0 to 16mL/h neg All 4K bath Not clotting K 4.5, P 2.7 Remains on iNO Seen on CRRT. Treatment reviewed.  Discussed with RN  Vitals:   07/17/23 0815 07/17/23 0830 07/17/23 0845 07/17/23 0900  BP:      Pulse: 82 82 81 82  Resp: (!) 23 (!) 23 (!) 22 (!) 22  Temp:      TempSrc:      SpO2: 100% 100% 100% 100%  Weight:      Height:        Physical Exam:       General adult male in bed on [place] O2 Lungs clear but reduced to auscultation bilaterally  Heart S1S2 Abdomen soft nontender nondistended Extremities no edema appreciated Neuro - resting, somnolent, awakens easily Access left internal jugular nontunneled dialysis catheter    OP HD: [place] triad MWF 3rd shift 3h    F250     94kg   2K/2.5 Ca bath   RUE AVF    Summary: [name] is a/an 52 y.o. 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 Underwent aortic biovalve replacement, TV repair, MV repari, bilat Maze procedures.    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 SP AVF ligation 9/06 by VVS. Weaning epi gtt down.   # ESRD - pt was too hypotensive for hemodialysis post-op.  CRRT started 8/16 and remains on CRRT.   # HD access - AVF ligated for CHF. Currently using temp HD cath   # Distributive shock - weaning epi gtt, on vaso gtt. Off levo and dobutamine. Cont midodrine 30 tid.   # pHTN - severe by RHC --> is now on inhaled NO.   # Volume - improved, wt's stable. Targeting CVP 12-13 due to severe RV failure per CHF team. Keeping even again today per CHF team.   # Pericardial effusion - per CT surgery  and CHF.  We are optimizing volume status with CRRT as above.  Assessed as moderate on last TTE   # Anemia of ESRD - transfuse as needed per primary team. Aranesp increased to 150 mcg weekly on Tuesdays    # MBD ckd -  Phos acceptable.  No binders currently as on CRRT - normally on renvela.    # Hypercalcemia - remains mild- moderate. Suspect due to immobility. Usually mild-mod hypercalcemia does not require treatment. Will follow.    [name], MD  07/17/2023, 9:50 AM  Recent Labs  Lab 07/16/23 0324 07/16/23 1600 07/17/23 0454 07/17/23 0455  HGB 7.9*  --   --  7.3*  ALBUMIN 2.5* 2.4* 2.3*  --   CALCIUM 11.1* 11.1* 11.2*  --   PHOS 2.1* 2.6 2.7  --   CREATININE 1.60* 1.73* 1.56*  --   K 4.4 4.9 4.5  --     Inpatient medications:  acyclovir  200 mg Per Tube BID   aspirin  81 mg Per Tube Daily   atorvastatin  10 mg Per Tube QHS   azelastine  2 spray Each Nare  BID   Chlorhexidine Gluconate Cloth  6 each Topical Daily   cinacalcet  30 mg Oral Q supper   colchicine  0.3 mg Per Tube Once every 5 days   darbepoetin (ARANESP) injection - DIALYSIS  150 mcg Subcutaneous Q Tue-1800   docusate  100 mg Per Tube BID   escitalopram  10 mg Per Tube Daily   folic acid  1 mg Per Tube Daily   guaiFENesin  15 mL Per Tube BID   heparin injection (subcutaneous)  5,000 Units Subcutaneous Q8H   [START ON 07/20/2023] influenza vac split trivalent PF  0.5 mL Intramuscular Once   insulin aspart  0-24 Units Subcutaneous Q4H   lidocaine  1 patch Transdermal Q24H   lidocaine-prilocaine  1 Application Topical Q M,W,F   lipase/protease/amylase)  20,880 Units Per Tube Once   And   sodium bicarbonate  650 mg Per Tube Once   metoCLOPramide  2.5 mg Per Tube BID   midodrine  30 mg Per Tube TID   multivitamin  1 tablet Per Tube BID   mouth rinse  15 mL Mouth Rinse 4 times per day   pantoprazole (PROTONIX) IV  40 mg Intravenous Q12H   polyethylene glycol  17 g Per Tube Daily   QUEtiapine  12.5 mg Per  Tube QHS   sodium chloride flush  3 mL Intravenous Q12H   thiamine  100 mg Oral Daily     prismasol BGK 4/2.5 400 mL/hr at 07/16/23 1534    prismasol BGK 4/2.5 400 mL/hr at 07/16/23 0604   sodium chloride 10 mL/hr at 07/17/23 0800   sodium chloride 10 mL/hr at 07/22/2023 1400   albumin human Stopped (06/23/23 0040)   epinephrine 3 mcg/min (07/17/23 0800)   feeding supplement (PIVOT 1.5 CAL) 70 mL/hr at 07/17/23 0800   norepinephrine (LEVOPHED) Adult infusion Stopped (07/13/23 0720)   prismasol BGK 4/2.5 1,800 mL/hr at 07/17/23 0452   vasopressin 0.04 Units/min (07/17/23 0800)   sodium chloride, sodium chloride, acetaminophen, albumin human, bisacodyl **OR** bisacodyl, clonazepam, heparin, magic mouthwash, ondansetron (ZOFRAN) IV, mouth rinse, oxyCODONE, phenol, simethicone, sodium chloride flush

## 2023-07-17 NOTE — Progress Notes (Signed)
Nutrition Follow-up  DOCUMENTATION CODES:   Severe malnutrition in context of chronic illness  INTERVENTION:   Tube Feeding via Cortrak: Pivot 1.5 at 70 ml/hr TF provides 2520 kcals, 158 g of protein and 1260 mL of free water  Close monitoring of skin around bridle and bridle clip given constant moisture from b/l nasal drainage; discussed with RN as high risk for skin breakdown  Recommend rechecking folate level given deficient levels earlier in admission and receiving supplementation  Continue renal MVI BID, Folic Acid and thiamine  NUTRITION DIAGNOSIS:   Severe Malnutrition related to chronic illness as evidenced by severe muscle depletion, severe fat depletion.  Being addressed   GOAL:   Patient will meet greater than or equal to 90% of their needs  Met via TF  MONITOR:   PO intake, TF tolerance, Labs, Weight trends  REASON FOR ASSESSMENT:   Consult Assessment of nutrition requirement/status  ASSESSMENT:   52 y/o male with h/o glomerulonephritis (age 89) s/p renal transplant (1999) and right and left renal transplants (2015), ESRD on HD (>20 yrs), HTN, bipolar 2 disorder, PTSD, ADHD, anxiety, depression, GERD, OSA, NICM, thrombocytopenia, pulmonary hypertension, afib s/p DCCV (03/2019), CHF, severe mitral and tricuspid valve regurgitation and moderate aortic stenosis s/p mitral valve repair, tricuspid valve annuloplasty, aortic valve replacement and full right and left maze procedure 8/16.  9/04 Started on iNO 07/27/2023 Ligation of R arm fistula with improved BP  Remains on CRRT (0-50 ml/hr based on CVP); weaning vasopressors slowly Levophed:  OFF Vasopressin: down to 0.01 Epinephrine:  OFF this AM Dobutamine: OFF  Systolic Goal of >16, noted Diastolic Pressure still consistently less than 50, still concern for decreased gut perfusion  Remains on iNO Pt remains lethargic but arousable with level of alertness varying through out the day.  Pt reporting pain (points  of abdomen) on visit today. Denies nausea currently  Cortrak remains in place, apparently clogged yesterday and TF held. RN able to unclog and TF resumed. Pivot 1.5 currently infusing at 70 ml/hr  Pt remains NPO. Noted pt has been writing on clip board to staff today for communication. Pt does not want to talk much because it hurts, noted magic mouthwash started yesterday  SLP continues to follow, noted tentative plan for FEES tomorrow.  RD remains concerned that even if diet able to be safely advanced, pt will not be able to adequately meet nutrition needs via oral route alone anytime soon given significant deconditioning/weakness, waxing/waning mental status and severe malnutrition. Pt may ultimately benefit from PEG placement.   Cortrak remains in place; noted significant drainage from nose, hard crusted drainage on skin between upper lip and nares. High risk for skin breakdown where bridle rests on skin given constant moisture. RN plans to thoroughly clean area and assess skin  Outpatient EDW 94 kg Current wt: 76 kg  Hypercalcemia remains, corrected calcium 12.6 (albumin 2.3), ionized calcium 1.6 (H), nephrology suspects this to be related to immobility. Noted iPTH 314 on 9/01. Noted pt started on Sensipar 30 mg daily on 9/04. Phosphorus and potassium acceptable (off binders, 4K+ bath)  Rectal tube remains in place; type 7 output although not large quantities. 135 mL in 24 hours. Pt with scheduled bowel regimen that continues to be given despite watery stool, may need to adjust to prn. BS hyperactive  Micronutrient Labs: CRP: 7.9 (H) Ceruloplasmin 36.5 (H) Copper: 131 (WNL) Folate, serum: 3.6 (L) 06/26/23 Iron, serum: 121 (WNL) Thiamine: 191.3 (WNL) Vitamin B12: 1175 (H) 06/26/23 Vitamin B6:  21.6 (WNL) Vitamin C: 0.7 (WNL) Zinc: 95 (WNL)   No pressure injuries documented; some possible MASD near rectum per RN  Labs: reviewed Meds: sensipar, aranesp, folic acid, thiamine, renal MVI  BID, midodrine  Diet Order:   Diet Order             Diet NPO time specified Except for: Ice Chips  Diet effective now                   EDUCATION NEEDS:   Not appropriate for education at this time  Skin:  Skin Assessment: Skin Integrity Issues: Skin Integrity Issues:: Incisions Incisions: sternal  Last BM:  9/9 small volume type 7 watery stool via rectal tube  Height:   Ht Readings from Last 1 Encounters:  07/26/23 6\' 3"  (1.905 m)    Weight:   Wt Readings from Last 1 Encounters:  07/17/23 76.1 kg    Ideal Body Weight:  89 kg  BMI:  Body mass index is 20.97 kg/m.  Estimated Nutritional Needs:   Kcal:  2400-2600 kcals  Protein:  155-190 g  Fluid:  1L plus UOP    [name] MS, RDN, LDN, CNSC Registered Dietitian 3 Clinical Nutrition RD Pager and On-Call Pager Number Located in [place]

## 2023-07-18 ENCOUNTER — Inpatient Hospital Stay (HOSPITAL_COMMUNITY): Payer: Medicare HMO

## 2023-07-18 DIAGNOSIS — R579 Shock, unspecified: Secondary | ICD-10-CM | POA: Diagnosis not present

## 2023-07-18 DIAGNOSIS — I5023 Acute on chronic systolic (congestive) heart failure: Secondary | ICD-10-CM | POA: Diagnosis not present

## 2023-07-18 DIAGNOSIS — I3139 Other pericardial effusion (noninflammatory): Secondary | ICD-10-CM | POA: Diagnosis not present

## 2023-07-18 DIAGNOSIS — Z9889 Other specified postprocedural states: Secondary | ICD-10-CM | POA: Diagnosis not present

## 2023-07-18 LAB — CBC WITH DIFFERENTIAL/PLATELET
Abs Immature Granulocytes: 0.03 10*3/uL (ref 0.00–0.07)
Basophils Absolute: 0.1 10*3/uL (ref 0.0–0.1)
Basophils Relative: 1 %
Eosinophils Absolute: 0.4 10*3/uL (ref 0.0–0.5)
Eosinophils Relative: 6 %
HCT: 26.6 % — ABNORMAL LOW (ref 39.0–52.0)
Hemoglobin: 7.7 g/dL — ABNORMAL LOW (ref 13.0–17.0)
Immature Granulocytes: 0 %
Lymphocytes Relative: 22 %
Lymphs Abs: 1.6 10*3/uL (ref 0.7–4.0)
MCH: 32 pg (ref 26.0–34.0)
MCHC: 28.9 g/dL — ABNORMAL LOW (ref 30.0–36.0)
MCV: 110.4 fL — ABNORMAL HIGH (ref 80.0–100.0)
Monocytes Absolute: 1.3 10*3/uL — ABNORMAL HIGH (ref 0.1–1.0)
Monocytes Relative: 18 %
Neutro Abs: 3.8 10*3/uL (ref 1.7–7.7)
Neutrophils Relative %: 53 %
Platelets: 245 10*3/uL (ref 150–400)
RBC: 2.41 MIL/uL — ABNORMAL LOW (ref 4.22–5.81)
RDW: 19.4 % — ABNORMAL HIGH (ref 11.5–15.5)
Smear Review: ADEQUATE
WBC: 7.2 10*3/uL (ref 4.0–10.5)
nRBC: 0.6 % — ABNORMAL HIGH (ref 0.0–0.2)

## 2023-07-18 LAB — GLUCOSE, CAPILLARY
Glucose-Capillary: 120 mg/dL — ABNORMAL HIGH (ref 70–99)
Glucose-Capillary: 66 mg/dL — ABNORMAL LOW (ref 70–99)
Glucose-Capillary: 91 mg/dL (ref 70–99)
Glucose-Capillary: 92 mg/dL (ref 70–99)
Glucose-Capillary: 93 mg/dL (ref 70–99)
Glucose-Capillary: 94 mg/dL (ref 70–99)

## 2023-07-18 LAB — COOXEMETRY PANEL
Carboxyhemoglobin: 1 % (ref 0.5–1.5)
Methemoglobin: <0.7 % (ref 0.0–1.5)
O2 Saturation: 97.2 %
Total hemoglobin: 8.2 g/dL — ABNORMAL LOW (ref 12.0–16.0)

## 2023-07-18 LAB — RENAL FUNCTION PANEL
Albumin: 2.3 g/dL — ABNORMAL LOW (ref 3.5–5.0)
Anion gap: 4 — ABNORMAL LOW (ref 5–15)
BUN: 25 mg/dL — ABNORMAL HIGH (ref 6–20)
CO2: 26 mmol/L (ref 22–32)
Calcium: 12.2 mg/dL — ABNORMAL HIGH (ref 8.9–10.3)
Chloride: 101 mmol/L (ref 98–111)
Creatinine, Ser: 1.67 mg/dL — ABNORMAL HIGH (ref 0.61–1.24)
GFR, Estimated: 50 mL/min — ABNORMAL LOW (ref >60)
Glucose, Bld: 98 mg/dL (ref 70–99)
Phosphorus: 3 mg/dL (ref 2.5–4.6)
Potassium: 4.6 mmol/L (ref 3.5–5.1)
Sodium: 131 mmol/L — ABNORMAL LOW (ref 135–145)

## 2023-07-18 LAB — COMPREHENSIVE METABOLIC PANEL
ALT: 17 U/L (ref 0–44)
AST: 45 U/L — ABNORMAL HIGH (ref 15–41)
Albumin: 2.3 g/dL — ABNORMAL LOW (ref 3.5–5.0)
Alkaline Phosphatase: 117 U/L (ref 38–126)
Anion gap: 5 (ref 5–15)
BUN: 22 mg/dL — ABNORMAL HIGH (ref 6–20)
CO2: 26 mmol/L (ref 22–32)
Calcium: 11.7 mg/dL — ABNORMAL HIGH (ref 8.9–10.3)
Chloride: 100 mmol/L (ref 98–111)
Creatinine, Ser: 1.61 mg/dL — ABNORMAL HIGH (ref 0.61–1.24)
GFR, Estimated: 52 mL/min — ABNORMAL LOW (ref >60)
Glucose, Bld: 100 mg/dL — ABNORMAL HIGH (ref 70–99)
Potassium: 4.8 mmol/L (ref 3.5–5.1)
Sodium: 131 mmol/L — ABNORMAL LOW (ref 135–145)
Total Bilirubin: 0.5 mg/dL (ref 0.3–1.2)
Total Protein: 8.9 g/dL — ABNORMAL HIGH (ref 6.5–8.1)

## 2023-07-18 LAB — AMMONIA: Ammonia: 48 umol/L — ABNORMAL HIGH (ref 9–35)

## 2023-07-18 LAB — MAGNESIUM: Magnesium: 2.6 mg/dL — ABNORMAL HIGH (ref 1.7–2.4)

## 2023-07-18 LAB — FOLATE: Folate: 16.7 ng/mL (ref >5.9)

## 2023-07-18 MED ORDER — CEFAZOLIN SODIUM-DEXTROSE 2-4 GM/100ML-% IV SOLN
2.0000 g | INTRAVENOUS | Status: AC
  Administered 2023-07-19: 2 g via INTRAVENOUS

## 2023-07-18 MED ORDER — CLONAZEPAM 0.25 MG PO TBDP
0.2500 mg | ORAL_TABLET | Freq: Two times a day (BID) | ORAL | Status: DC | PRN
  Filled 2023-07-18 (×2): qty 1

## 2023-07-18 MED ORDER — GERHARDT'S BUTT CREAM
TOPICAL_CREAM | Freq: Every day | CUTANEOUS | Status: DC
  Administered 2023-07-19 – 2023-07-29 (×2): 1 via TOPICAL
  Filled 2023-07-18 (×2): qty 1

## 2023-07-18 MED ORDER — CEFAZOLIN SODIUM-DEXTROSE 2-4 GM/100ML-% IV SOLN: 2.0000 g | INTRAVENOUS | Status: DC

## 2023-07-18 NOTE — Progress Notes (Signed)
SLP Cancellation Note  Patient Details Name: [name] MRN: [id number] DOB: 03/12/1971   Cancelled treatment:       Reason Eval/Treat Not Completed: Patient at procedure or test/unavailable. Procedures planned for today, was planning on a FEES this pm again. Will cancel and f/u for readiness.    Paris Chir[place]oga, Riley Nearing 07/18/2023, 11:14 AM

## 2023-07-18 NOTE — Progress Notes (Signed)
[facility] Progress Note  Subjective:  Off epi, On VP UF based upon CVP 0 to 24mL/h neg All 4K bath Not clotting K 4.8, P 2.6 Remains on iNO Seen on CRRT. Treatment reviewed.  Discussed with RN  Vitals:   07/18/23 0645 07/18/23 0700 07/18/23 0801 07/18/23 0805  BP:      Pulse: 82 82  82  Resp: 15 (!) 27  14  Temp:   (!) 97.2 F (36.2 C)   TempSrc:      SpO2: 97% 100%  100%  Weight:      Height:        Physical Exam:       General adult male in bed on [place] O2 Lungs clear but reduced to auscultation bilaterally  Heart S1S2 Abdomen soft nontender nondistended Extremities no edema appreciated Neuro - resting, somnolent, awakens easily Access left internal jugular nontunneled dialysis catheter    OP HD: [place] triad MWF 3rd shift 3h    F250     94kg   2K/2.5 Ca bath   RUE AVF    Summary: [name] is a/an 52 y.o. 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 Underwent aortic biovalve replacement, TV repair, MV repari, bilat Maze procedures.    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 SP AVF ligation 9/06 by VVS. Weaning epi gtt down.   # ESRD - pt was too hypotensive for hemodialysis post-op.  CRRT started 8/16 and remains on CRRT.   # HD access - AVF ligated for CHF. Currently using temp HD cath in internal jugular.  Tentative plan for [facility] when PPM placed to minimize AC interruption, prior to warfarin initiation   # Distributive shock -Weening, still on vaso gtt. cont midodrine 30 tid.   # pHTN - severe by RHC --> is now on inhaled NO.   # Volume - improved, wt's stable. Targeting CVP 12-13 due to severe RV failure per CHF team. Keeping  even again today per CHF team.   # Pericardial effusion - per CT surgery and CHF.  We are optimizing volume status with CRRT as above.  Assessed as moderate on last TTE   # Anemia of ESRD - transfuse as needed per primary team. Aranesp increased to 150 mcg weekly on Tuesdays    # MBD ckd -  Phos acceptable.  No binders currently as on CRRT - normally on renvela.    # Hypercalcemia - remains mild- moderate. Suspect due to immobility. Usually mild-mod hypercalcemia does not require treatment. Will follow.    [name], MD  07/18/2023, 10:27 AM  Recent Labs  Lab 07/17/23 0454 07/17/23 0455 07/17/23 1600 07/18/23 0423  HGB  --  7.3*  --  7.7*  ALBUMIN 2.3*  --  2.3* 2.3*  CALCIUM 11.2*  --  11.2* 11.7*  PHOS 2.7  --  2.6  --   CREATININE 1.56*  --  1.54* 1.61*  K 4.5  --  4.6 4.8    Inpatient medications:  aspirin  81 mg Per Tube Daily   atorvastatin  10 mg Per Tube QHS   azelastine  2  spray Each Nare BID   Chlorhexidine Gluconate Cloth  6 each Topical Daily   cinacalcet  30 mg Oral Q supper   darbepoetin (ARANESP) injection - DIALYSIS  150 mcg Subcutaneous Q Tue-1800   escitalopram  10 mg Per Tube Daily   folic acid  1 mg Per Tube Daily   guaiFENesin  15 mL Per Tube BID   heparin injection (subcutaneous)  5,000 Units Subcutaneous Q8H   [START ON 07/20/2023] influenza vac split trivalent PF  0.5 mL Intramuscular Once   insulin aspart  0-9 Units Subcutaneous Q4H   midodrine  30 mg Per Tube TID   multivitamin  1 tablet Per Tube BID   mouth rinse  15 mL Mouth Rinse 4 times per day   pantoprazole (PROTONIX) IV  40 mg Intravenous Q12H   sodium chloride flush  3 mL Intravenous Q12H   thiamine  100 mg Oral Daily     prismasol BGK 4/2.5 400 mL/hr at 07/18/23 0304    prismasol BGK 4/2.5 400 mL/hr at 07/18/23 5284   sodium chloride 10 mL/hr at 07/18/23 1000   sodium chloride 10 mL/hr at 07/26/2023 1400   albumin human Stopped (06/23/23 0040)   epinephrine Stopped (07/18/23  0703)   feeding supplement (PIVOT 1.5 CAL) 70 mL/hr at 07/18/23 1000   prismasol BGK 4/2.5 1,800 mL/hr at 07/18/23 0910   vasopressin 0.03 Units/min (07/18/23 1000)   sodium chloride, sodium chloride, acetaminophen, albumin human, bisacodyl **OR** bisacodyl, clonazepam, docusate, heparin, magic mouthwash, ondansetron (ZOFRAN) IV, mouth rinse, oxyCODONE, phenol, polyethylene glycol, simethicone, sodium chloride flush

## 2023-07-18 NOTE — Progress Notes (Signed)
  Patient Name: [name] Date of Encounter: 07/18/2023  Primary Cardiologist: Reatha Harps, MD Electrophysiologist: Lanier Prude, MD  Interval Summary   Have been able to gradually wean pressors since AVF ligation.   Back on vaso this am; Remains on 5 of iNO.   Remains lethargic.   Vital Signs    Vitals:   07/18/23 1014 07/18/23 1100 07/18/23 1110 07/18/23 1200  BP: (!) 103/44  (!) 99/33 (!) 109/35  Pulse: 82 82 82 82  Resp: 20 (!) 23 19 (!) 7  Temp:      TempSrc:      SpO2: 100% 100% 100% 100%  Weight:      Height:        Intake/Output Summary (Last 24 hours) at 07/18/2023 1239 Last data filed at 07/18/2023 1200 Gross per 24 hour  Intake 2193.9 ml  Output 1478.5 ml  Net 715.4 ml   Filed Weights   07/16/23 0500 07/17/23 0500 07/18/23 0323  Weight: 75.2 kg 76.1 kg 75.6 kg    Physical Exam    GEN- The patient is chronically ill appearing.  Lethargic, opens eyes to questions and intermittently participates with conversation.  Lungs- Diminished throughout.  Cardiac- Regular rate and rhythm GI- soft, NT, ND, + BS Extremities- Trace BLE edema.   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 EF 55-60% w/ mild RV dysfunction by echo in 6/24.    Admitted for elective triple vale surgery + MAZE. S/p bioprosthetic aortic valve replacement + mitral and tricuspid valve repairs. Post-op course c/b persistent vasoplagic syndrome.   Assessment & Plan    CHB S/p AVR/TVr/MVr/MAZE/LAAC Epicardial wires with intermittent capture and worsening threshold Now s/p RIJ temp wire.  Has intermittently had escape in the  50s Threshold remains stable @ <1.0. His options remain poor.  He is very poor candidate for permanent transvenous pacing.  He would potentially be a candidate for Leadless pacing, but at this time is not a candidate for general sedation with ongoing iNO.   Potentially could consider a temporary-permanent system as a bridge to his recovery and candidacy for more in depth procedures.   ESRD Per primary team Now back on CVVHD, keeping even again today.    Acute on Chronic Systolic Heart Failure w/ Prominent RV Dysfunction  Post-op Vasopalegia/high-output HF Echo 08/29: Moderate pericardial effusion, no tamponade RHC c/w high output HF physiology 2/2 AV fistula despite severe RV dysfunction  s/p AVF ligation on 9/6 Off NE. Back on small amount of Vaso this am.  Attempting to wean off iNO today Remains on Midodrine 30 mg TID.    For questions or updates, please contact [facility] Please consult [email or web address] for contact info under Cardiology/STEMI.  Signed, Graciella Freer, PA-C  07/18/2023, 12:39 PM

## 2023-07-18 NOTE — Progress Notes (Signed)
   07/18/23 0830  Spiritual Encounters  Type of Visit Initial  Care provided to: Patient  Conversation partners present during encounter Nurse  Referral source Patient request  Reason for visit Routine spiritual support  OnCall Visit No   Chaplain responded to a call for support. The patient, [name] was awake and agreed to visit with me.  He asked for prayer which we did briefly.  It was during rounding on the unit.,so I stepped away.  If Egan asks for a chaplain someone will respond.   Valerie Roys [facility]  [phone]

## 2023-07-18 NOTE — Progress Notes (Signed)
NAME:  Drilon Ussery, MRN:  [id number], DOB:  Oct 04, 1971, LOS: 26 ADMISSION DATE:  July 14, 2023, CONSULTATION DATE: 08/03/2023 REFERRING MD: Leafy Ro - TCTS, CHIEF COMPLAINT: Postcardiac surgery  History of Present Illness:  This is a 52 year old gentleman, past medical history of anxiety depression, bipolar disease, congestive heart failure chronic diastolic heart failure kidney disease followed by renal transplant in 10-18-1998 repeat renal transplant in 2014/10/18.  Patient underwent surgery today for severe mitral regurgitation severe tricuspid regurgitation and moderate aortic stenosis.  He also has atrial fibrillation received maze procedure.  He had a complex mitral valve repair a tricuspid valve annuloplasty and aortic valve replacement followed by full right and left maze procedure.  Pertinent Medical History:   Past Medical History:  Diagnosis Date   ADHD (attention deficit hyperactivity disorder)    Anemia    Anemia, chronic disease 07/13/2011   Anxiety    Arthritis    Bipolar 2 disorder (HCC) 08/18/2011   Diagnosed in October 18, 2010.    Bipolar disorder (HCC)    Blood dyscrasia    Nephrotic Anemia   Blood transfusion    Blood transfusion without reported diagnosis    Chronic diastolic CHF (congestive heart failure) (HCC)    Complication of anesthesia    Woke up intubated gets combative  afraid to be alone    Congestive heart failure (CHF) (HCC) 07/13/2011   Onset 10/18/2005.  Followed by Dr. Eden Emms.  S/p cardiac catheterization in [facility] Dr. Anne Fu..  S/p cardiac catheterization in 10-18-13 by Nishan.  Echo October 18, 2013.    Coronary artery disease    Depression    Dysrhythmia    A. Fib 10-19-23 with DCCV in May 2024   Erectile dysfunction    ESRD on hemodialysis (HCC) 07/13/2011   Glomerulonephritis at age 59, started HD in 10-19-95.  Deceased donor renal transplant October 18, 1998 at [facility], then kidney failed and went back on HD in Oct 18, 2005.  L forearm AVF . s/p repeat renal transplant 10/2014 [facility].   GI bleed 11/01/2011   Rectal  bleeding with emesis/diarrhea    Heart murmur    Hemodialysis status (HCC)    HSV (herpes simplex virus) infection    Hydrocele, right 11/25/2014   Hypertension    Hypertensive emergency 01/06/2013   Hypoglycemia 01/11/2013   Influenza-like illness 12/18/2013   Mild ascending aorta dilatation (HCC)    NICM (nonischemic cardiomyopathy) (HCC)    Pericardial effusion    a. Mod by echo in 10-18-13, similar to prior.   Peripheral polyneuropathy 08/04/2016   Pneumonia    Pulmonary edema 01/06/2013   Pulmonary hypertension associated with end stage renal disease on dialysis [facility]) 07/13/2011   Renal transplant, status post 11/25/2014   Pt with Glomerulonephritis at age 89. Deceased donor renal transplant 1998/10/18 right and left renal transplant 10/2014.  Followed by [facility] transplant team; nephrologist is Dr. Lonna Cobb.    Respiratory failure with hypoxia (HCC) 01/06/2013   Shortness of breath 12/12/2012   Sleep apnea    Swollen testicle 07/13/2011   URI (upper respiratory infection) 01/25/2012    Significant Hospital Events: Including procedures, antibiotic start and stop dates in addition to other pertinent events   14-Jul-2023 - AVR, MVR, TVR with TCTS ([place]). Remains on multiple pressors (NE, Epi, vaso). 8/16 - LIJ Trialysis catheter placed for CRRT.  8/17 extubated  8/18 - remains on CVVHD, 3 pressors  8/19-8/20 improving pressor needs 8/21 agitated delirium; cortrak 8/26 CRRT restarted 8/27 TVP placed, brady overnight and pacing turned to 80 8/30 palli consult  9/1 PRBC for low coox  9/2 coox down a bit again  9/3CRRT off overnight due to access issues. Remains on vaso 0.04, epi 10, norepi 2, DBA 5. 9/4 RHC, started iNO 9/6 repeat RHC, fistula ligated 9/9 Remains on vaso 0.04, epi . 30 midodrine TID, Temp pacer, small effusions on CXR , mild interstitial edema, left lower lobe consolidation most likely atelectasis , afebrile 9/10 has come of vasopressors. Echo continue to show  severe RV dysfunction.    Interim History / Subjective:   Went back on pressors transiently last night. Weaning back off this morning.   Objective:  Blood pressure (!) 92/35, pulse 82, temperature (!) 97.2 F (36.2 C), resp. rate 14, height 6\' 3"  (1.905 m), weight 75.6 kg, SpO2 100%. CVP:  [5 mmHg-16 mmHg] 13 mmHg      Intake/Output Summary (Last 24 hours) at 07/18/2023 0959 Last data filed at 07/18/2023 0800 Gross per 24 hour  Intake 2125 ml  Output 1649.5 ml  Net 475.5 ml   Filed Weights   07/16/23 0500 07/17/23 0500 07/18/23 0323  Weight: 75.2 kg 76.1 kg 75.6 kg   Physical Examination:    General: chronically ill appearing man lying in bed  awake, weak HEENT: temporal wasting, oral mucosa pink and dry CV: S1S2, RRR, paced rhythm (Temp pacer) Pulm: Bilateral chest excursion , breathing comfortably on 4 L  [place], iNO, few rales per bases , diminished per bases. Wet-sounding weak cough GI: soft, NT, ND, BS +, Body mass index is 20.83 kg/m.  Derm: warm, dry, no rashes, no lesions  MSK: muscle wasting, no edema, globally weak Neuro: arouses to verbal stimulation, quiet voice, globally weak.   Ancillary Tests Personally Reviewed:   Na: 131, Creatinine 1.61.  Hb 7.7, ScvO2 97  Assessment & Plan:   ICU delirium, acute encephalopathy - improving.  -Stopped all sedatives - Can start mobilizing once PPM in  Acute respiratory failure with hypoxia & hypercapnia - stop iNO today. -supplemental O2 to maintain SpO2 >90% -encourage IS  MVR, TVR, AS s/p bioprosthetic replacement CHB/ post op junctional rhythm s/p TVP (8/27) Cardiogenic shock, RV failure Stable pericardial effusion w/o tamponade AoC HFrEF> currently EF recovered Afib s/p MAZE  -Continue midodrine  - Changed pressor parameters to SBP>90 - Needs PPM placement to allow for mobilization.   ESRD; CKD s/p failed renal transplants x 2 (1999, 2015)  - Place tunneled HD line and eventually trial on HD.   H/o  HSV -con't acyclovir, PTA med  Anemia - expected ABLA (resolved), critical illness, chronic dz  -transfuse for Hb <7 or hemodynamically significant bleeding -aranesp per nephro  - Trend CBC  Severe protein energy malnutrition  -con't TF, can eat as he can tolerate  Possible gout flare -con't colchicine for 5 days.   Sore throat -magic mouthwash  Physical deconditioning  -needs aggressive PT, OT, SLP; CRRT complicates this. -anticipate he will need CIR once he is liberated from CVVH, and off pressors  GOC -Family had been updated 9/9. Full aggressive care desired.     Best Practice (right click and "Reselect all SmartList Selections" daily)   Diet/type:TF  DVT prophylaxis:heparin GI prophylaxis: PPI Lines: Central line, Dialysis Catheter, and Arterial Line Foley:  No  Code Status:  full code Last date of multidisciplinary goals of care discussion [Per Primary Team]  This patient is critically ill with multiple organ system failure which requires frequent high complexity decision making, assessment, support, evaluation, and titration of therapies. This was completed  through the application of advanced monitoring technologies and extensive interpretation of multiple databases. During this encounter critical care time was devoted to patient care services described in this note for 40 minutes.  Lynnell Catalan, MD 07/18/23 9:59 AM [facility]  For contact information, see Amion. If no response to pager, please call PCCM consult pager. After hours, 7PM- 7AM, please call Elink.

## 2023-07-18 NOTE — Progress Notes (Signed)
PT Cancellation Note  Patient Details Name: [name] MRN: [id number] DOB: 1971/09/29   Cancelled Treatment:    Reason Eval/Treat Not Completed: Medical issues which prohibited therapy. Pt remains to have femoral art line which per RN should be removed today to allow for EOB/OOB mobility progression. RN reports doing bed level exercises with patient. RN also reports that patient has plans for OR today to have pacemaker places. Acute PT to return as able to progress OOB mobility as able, as appropriate.  [name], PT, DPT Acute Rehabilitation Services Secure chat preferred Office #: [phone]    [name] 07/18/2023, 10:15 AM

## 2023-07-18 NOTE — Progress Notes (Signed)
[address]       [place] [address]             [phone]      4 Days Post-Op  Procedure(s) (LRB): LIGATION OF RIGHT ARTERIOVENOUS  FISTULA (Right)   Total Length of Stay:  LOS: 26 days    SUBJECTIVE: Restarted vaso overnight Wants to have Tea  Vitals:   07/18/23 0645 07/18/23 0700  BP:    Pulse: 82 82  Resp: 15 (!) 27  Temp:    SpO2: 97% 100%    Intake/Output      09/09 0701 09/10 0700 09/10 0701 09/11 0700   I.V. (mL/kg) 436.2 (5.8)    NG/GT 1700    Total Intake(mL/kg) 2136.2 (28.3)    Emesis/NG output 0    Stool 195    CRRT 1727.3    Total Output 1922.3    Net +213.9              prismasol BGK 4/2.5 400 mL/hr at 07/18/23 0304    prismasol BGK 4/2.5 400 mL/hr at 07/17/23 2030   sodium chloride 10 mL/hr at 07/18/23 0600   sodium chloride 10 mL/hr at 07/16/2023 1400   albumin human Stopped (06/23/23 0040)   epinephrine Stopped (07/18/23 0541)   feeding supplement (PIVOT 1.5 CAL) 70 mL/hr at 07/18/23 0600   norepinephrine (LEVOPHED) Adult infusion Stopped (07/13/23 0720)   prismasol BGK 4/2.5 1,800 mL/hr at 07/18/23 0546   vasopressin 0.04 Units/min (07/18/23 0600)    CBC    Component Value Date/Time   WBC 7.2 07/18/2023 0423   RBC 2.41 (L) 07/18/2023 0423   HGB 7.7 (L) 07/18/2023 0423   HCT 26.6 (L) 07/18/2023 0423   PLT 245 07/18/2023 0423   MCV 110.4 (H) 07/18/2023 0423   MCH 32.0 07/18/2023 0423   MCHC 28.9 (L) 07/18/2023 0423   RDW 19.4 (H) 07/18/2023 0423   LYMPHSABS 1.6 07/18/2023 0423   MONOABS 1.3 (H) 07/18/2023 0423   EOSABS 0.4 07/18/2023 0423   BASOSABS 0.1 07/18/2023 0423   CMP     Component Value Date/Time   NA 131 (L) 07/18/2023 0423   K 4.8 07/18/2023 0423   CL 100 07/18/2023 0423   CO2 26 07/18/2023 0423   GLUCOSE 100 (H) 07/18/2023 0423   BUN 22 (H) 07/18/2023 0423   CREATININE 1.61 (H) 07/18/2023 0423   CALCIUM 11.7 (H) 07/18/2023 0423   CALCIUM 9.8 02/18/2011 1007   PROT 8.9 (H)  07/18/2023 0423   ALBUMIN 2.3 (L) 07/18/2023 0423   AST 45 (H) 07/18/2023 0423   ALT 17 07/18/2023 0423   ALKPHOS 117 07/18/2023 0423   BILITOT 0.5 07/18/2023 0423   GFRNONAA 52 (L) 07/18/2023 0423   GFRAA 10 (L) 03/16/2020 0825   ABG    Component Value Date/Time   PHART 7.317 (L) 07/11/2023 1346   PCO2ART 48.2 (H) 07/11/2023 1346   PO2ART 103 07/11/2023 1346   HCO3 27.9 07/16/2023 1038   HCO3 27.6 07/11/2023 1038   TCO2 29 08/02/2023 1038   TCO2 29 07/30/2023 1038   ACIDBASEDEF 1.0 August 01, 2023 1517   O2SAT 97.2 07/18/2023 0418   CBG (last 3)  Recent Labs    07/17/23 1939 07/17/23 2330 07/18/23 0418  GLUCAP 89 86 91  EXAM Lungs: rhonchorous Card:rr Ext: warm Neuro: withdrawn but responds to questions  ASSESSMENT: SP AVR and MV/TV repair  MAZE Hemodynamically back on vaso. Need to try to keep off Discuss with  EP plan for PPM. Would be nice to get OOB and not worry about dislodging temp wire Need to plan on tunneled dialysis catheter Swallow study Low dose coumadin Cont CRRT ? Unit of blood. Will discuss with AHF/Renal   Eugenio Hoes, MD 07/18/2023

## 2023-07-18 NOTE — Progress Notes (Signed)
VTI:   1.46 cm     TV Mean grad:   3.7 mmHg MV Peak grad:  18.0 mmHg    TV Vmax:        1.32 m/s MV Mean grad:  8.3 mmHg     TV Vmean:       94.7 cm/s MV Vmax:       2.12 m/s     TV VTI:         0.33 msec MV Vmean:      130.3 cm/s MV Decel Time: 387 msec     SHUNTS MV E velocity: 189.00 cm/s  Systemic VTI:  0.28 m                             Systemic Diam: 1.90 cm Carolan Clines Electronically signed by Carolan Clines Signature Date/Time: 07/17/2023/11:16:48 AM    Final     Medications:     Scheduled Medications:  acyclovir  200 mg Per Tube BID   aspirin  81 mg Per Tube Daily   atorvastatin  10 mg Per Tube QHS   azelastine  2 spray Each Nare BID   Chlorhexidine Gluconate Cloth  6 each Topical Daily   cinacalcet  30 mg Oral Q supper   colchicine  0.3 mg Per Tube Once every 5 days   darbepoetin (ARANESP) injection - DIALYSIS  150 mcg Subcutaneous Q Tue-1800   escitalopram  10 mg Per Tube Daily   folic acid   1 mg Per Tube Daily   guaiFENesin  15 mL Per Tube BID   heparin injection (subcutaneous)  5,000 Units Subcutaneous Q8H   [START ON 07/20/2023] influenza vac split trivalent PF  0.5 mL Intramuscular Once   insulin aspart  0-9 Units Subcutaneous Q4H   lidocaine  1 patch Transdermal Q24H   lipase/protease/amylase)  20,880 Units Per Tube Once   And   sodium bicarbonate  650 mg Per Tube Once   midodrine  30 mg Per Tube TID   multivitamin  1 tablet Per Tube BID   mouth rinse  15 mL Mouth Rinse 4 times per day   pantoprazole (PROTONIX) IV  40 mg Intravenous Q12H   QUEtiapine  12.5 mg Per Tube QHS   sodium chloride flush  3 mL Intravenous Q12H   thiamine  100 mg Oral Daily    Infusions:   prismasol BGK 4/2.5 400 mL/hr at 07/18/23 0304    prismasol BGK 4/2.5 400 mL/hr at 07/17/23 2030   sodium chloride 10 mL/hr at 07/18/23 0800   sodium chloride 10 mL/hr at 07/10/2023 1400   albumin human Stopped (06/23/23 0040)   epinephrine Stopped (07/18/23 0703)   feeding supplement (PIVOT 1.5 CAL) 70 mL/hr at 07/18/23 0800   norepinephrine (LEVOPHED) Adult infusion Stopped (07/13/23 0720)   prismasol BGK 4/2.5 1,800 mL/hr at 07/18/23 0546   vasopressin 0.04 Units/min (07/18/23 0800)    PRN Medications: sodium chloride, sodium chloride, acetaminophen, albumin human, bisacodyl **OR** bisacodyl, clonazepam, docusate, heparin, magic mouthwash, ondansetron (ZOFRAN) IV, mouth rinse, oxyCODONE, phenol, polyethylene glycol, simethicone, sodium chloride flush  Patient Profile   52 y/o AAM w/ mildly reduced systolic heart failure (EF once 45-50% in 2013 but normal on subsequent echos) valvular heart disease including severe AS, 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 EF 55-60% w/  VTI:   1.46 cm     TV Mean grad:   3.7 mmHg MV Peak grad:  18.0 mmHg    TV Vmax:        1.32 m/s MV Mean grad:  8.3 mmHg     TV Vmean:       94.7 cm/s MV Vmax:       2.12 m/s     TV VTI:         0.33 msec MV Vmean:      130.3 cm/s MV Decel Time: 387 msec     SHUNTS MV E velocity: 189.00 cm/s  Systemic VTI:  0.28 m                             Systemic Diam: 1.90 cm Carolan Clines Electronically signed by Carolan Clines Signature Date/Time: 07/17/2023/11:16:48 AM    Final     Medications:     Scheduled Medications:  acyclovir  200 mg Per Tube BID   aspirin  81 mg Per Tube Daily   atorvastatin  10 mg Per Tube QHS   azelastine  2 spray Each Nare BID   Chlorhexidine Gluconate Cloth  6 each Topical Daily   cinacalcet  30 mg Oral Q supper   colchicine  0.3 mg Per Tube Once every 5 days   darbepoetin (ARANESP) injection - DIALYSIS  150 mcg Subcutaneous Q Tue-1800   escitalopram  10 mg Per Tube Daily   folic acid   1 mg Per Tube Daily   guaiFENesin  15 mL Per Tube BID   heparin injection (subcutaneous)  5,000 Units Subcutaneous Q8H   [START ON 07/20/2023] influenza vac split trivalent PF  0.5 mL Intramuscular Once   insulin aspart  0-9 Units Subcutaneous Q4H   lidocaine  1 patch Transdermal Q24H   lipase/protease/amylase)  20,880 Units Per Tube Once   And   sodium bicarbonate  650 mg Per Tube Once   midodrine  30 mg Per Tube TID   multivitamin  1 tablet Per Tube BID   mouth rinse  15 mL Mouth Rinse 4 times per day   pantoprazole (PROTONIX) IV  40 mg Intravenous Q12H   QUEtiapine  12.5 mg Per Tube QHS   sodium chloride flush  3 mL Intravenous Q12H   thiamine  100 mg Oral Daily    Infusions:   prismasol BGK 4/2.5 400 mL/hr at 07/18/23 0304    prismasol BGK 4/2.5 400 mL/hr at 07/17/23 2030   sodium chloride 10 mL/hr at 07/18/23 0800   sodium chloride 10 mL/hr at 07/10/2023 1400   albumin human Stopped (06/23/23 0040)   epinephrine Stopped (07/18/23 0703)   feeding supplement (PIVOT 1.5 CAL) 70 mL/hr at 07/18/23 0800   norepinephrine (LEVOPHED) Adult infusion Stopped (07/13/23 0720)   prismasol BGK 4/2.5 1,800 mL/hr at 07/18/23 0546   vasopressin 0.04 Units/min (07/18/23 0800)    PRN Medications: sodium chloride, sodium chloride, acetaminophen, albumin human, bisacodyl **OR** bisacodyl, clonazepam, docusate, heparin, magic mouthwash, ondansetron (ZOFRAN) IV, mouth rinse, oxyCODONE, phenol, polyethylene glycol, simethicone, sodium chloride flush  Patient Profile   52 y/o AAM w/ mildly reduced systolic heart failure (EF once 45-50% in 2013 but normal on subsequent echos) valvular heart disease including severe AS, 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 EF 55-60% w/  mild RV dysfunction by echo in 6/24.    Admitted for elective triple vale surgery + MAZE.  S/p bioprosthetic aortic valve replacement + mitral and tricuspid valve repairs. Post-op course c/b persistent vasoplagic syndrome.   Assessment/Plan   1. Post-op Vasopalegia/high-output HF - Echo 06/29/23 EF 60-65%, no RWMA, small pericardial effusion present, MV repaired/replaced, trivial MR. TR repaired/replaced, mild TR. AV repaired/replaced.  - Echo 06/14/2023: EF 70-75%, severe concentric LVH, RV mod reduced, RV severely enlarged. LA/RA mod dilated. Large pericardial effusion. No evidence of tamponade - Echo 08/29: Moderate pericardial effusion, no tamponade - RHC c/w high output HF physiology 2/2 AV fistula despite severe RV dysfunction  - s/p AVF ligation on 9/6 - off NE. On VP 0.04. Continue to wean pressors for SBP 95  - CVP 12. Targeting CVP 12-13 d/t severe RV failure. Keep even on CRRT today.    2.  Acute on Chronic Systolic Heart Failure w/ Prominent RV Dysfunction  - LVEF once 45-50% in 2013 but normal on subsequent echos. Pre-op TTE 6/25 EF 55-60%, GIIIDD (Restrictive) RV mildly reduced, mod PH w/ estimated RVSP 48 mmHg  - Intra-op TEE (while on 4 mcg of Epi) LVEF 45%, RV mod-severely reduced, stable AV prosthesis w/ normal gradient and stable valvuloplasty rings w/ mod residual MS, mean gradient 7 mmgH.   - echo 06/29/2023: EF 70-75%, severe concentric LVH, RV mod reduced, RV severely enlarged. LA/RA mod dilated. Large pericardial effusion. No evidence of tamponade.  - Echo 08/29: EF 65-70%, RV moderately reduced, pericardial effusion slightly smaller, no tamponade - RHC c/w high output HF physiology with very high pulmonary pressures 2/2 AV fistula despite severe RV dysfunction - s/p Fistula ligation 9/6   - off NE. VP at 0.04. Wean as tolerated for SBP >= 90.  - Cont Midodrine 30 mg TID - stop iNO and observe  - Echo 9/9 EF 60-65%, RV mod reduced. + large pericardial effusion. No tamponade however if unable to wean off VP, ? If any benefit from pericardiocentesis    3. Valvular  Heart Disease  - pre-op diagnosis, severe AS, severe MR 2/2 MVP, severe TR - s/p bioprosthetic AVR, mitral and tricuspid valve repairs - stable gradients on intra-op TEE post repairs - stable on echo 8/22 and 8/27 - stable on echo 9/9, mod MS mG 8.3 mmHg    4. Post-op CHB  - EP following. - S/p RIJ temp wire 08/27 - EPW left in place d/t concern for risk of tamponade - V paced 80.    5. 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 Nephrology following.  - Plan as above   6. H/o Atrial Fibrillation  - S/p MAZE    7. Large pericardial effusion - seen on echo 06/17/2023. Previously small on echo 06/29/23 - no evidence of tamponade - Limited echo 08/29 stable - Echo 9/9, large effusion. C/w pressor support. ? If any benefit from pericardiocentesis. Will d/w MD   8. Severe PAH - pre-op PA pressures 58/30. With PVR 1.2 CO 10L  - post-op PA 97/33 (55) PCWP 30 PVR 4.2 CO 7L - suspect combination of valvular heart disease and high-output physiology - should improved with AVF ligation - on iNO at 10. Stop today and observe  9. GOC - palliative care initially following. Now signed off 9/2 per patient request - remains full code   Length of Stay: [address] PA-C  07/18/2023, 9:02 AM  VTI:   1.46 cm     TV Mean grad:   3.7 mmHg MV Peak grad:  18.0 mmHg    TV Vmax:        1.32 m/s MV Mean grad:  8.3 mmHg     TV Vmean:       94.7 cm/s MV Vmax:       2.12 m/s     TV VTI:         0.33 msec MV Vmean:      130.3 cm/s MV Decel Time: 387 msec     SHUNTS MV E velocity: 189.00 cm/s  Systemic VTI:  0.28 m                             Systemic Diam: 1.90 cm Carolan Clines Electronically signed by Carolan Clines Signature Date/Time: 07/17/2023/11:16:48 AM    Final     Medications:     Scheduled Medications:  acyclovir  200 mg Per Tube BID   aspirin  81 mg Per Tube Daily   atorvastatin  10 mg Per Tube QHS   azelastine  2 spray Each Nare BID   Chlorhexidine Gluconate Cloth  6 each Topical Daily   cinacalcet  30 mg Oral Q supper   colchicine  0.3 mg Per Tube Once every 5 days   darbepoetin (ARANESP) injection - DIALYSIS  150 mcg Subcutaneous Q Tue-1800   escitalopram  10 mg Per Tube Daily   folic acid   1 mg Per Tube Daily   guaiFENesin  15 mL Per Tube BID   heparin injection (subcutaneous)  5,000 Units Subcutaneous Q8H   [START ON 07/20/2023] influenza vac split trivalent PF  0.5 mL Intramuscular Once   insulin aspart  0-9 Units Subcutaneous Q4H   lidocaine  1 patch Transdermal Q24H   lipase/protease/amylase)  20,880 Units Per Tube Once   And   sodium bicarbonate  650 mg Per Tube Once   midodrine  30 mg Per Tube TID   multivitamin  1 tablet Per Tube BID   mouth rinse  15 mL Mouth Rinse 4 times per day   pantoprazole (PROTONIX) IV  40 mg Intravenous Q12H   QUEtiapine  12.5 mg Per Tube QHS   sodium chloride flush  3 mL Intravenous Q12H   thiamine  100 mg Oral Daily    Infusions:   prismasol BGK 4/2.5 400 mL/hr at 07/18/23 0304    prismasol BGK 4/2.5 400 mL/hr at 07/17/23 2030   sodium chloride 10 mL/hr at 07/18/23 0800   sodium chloride 10 mL/hr at 07/10/2023 1400   albumin human Stopped (06/23/23 0040)   epinephrine Stopped (07/18/23 0703)   feeding supplement (PIVOT 1.5 CAL) 70 mL/hr at 07/18/23 0800   norepinephrine (LEVOPHED) Adult infusion Stopped (07/13/23 0720)   prismasol BGK 4/2.5 1,800 mL/hr at 07/18/23 0546   vasopressin 0.04 Units/min (07/18/23 0800)    PRN Medications: sodium chloride, sodium chloride, acetaminophen, albumin human, bisacodyl **OR** bisacodyl, clonazepam, docusate, heparin, magic mouthwash, ondansetron (ZOFRAN) IV, mouth rinse, oxyCODONE, phenol, polyethylene glycol, simethicone, sodium chloride flush  Patient Profile   52 y/o AAM w/ mildly reduced systolic heart failure (EF once 45-50% in 2013 but normal on subsequent echos) valvular heart disease including severe AS, 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 EF 55-60% w/  VTI:   1.46 cm     TV Mean grad:   3.7 mmHg MV Peak grad:  18.0 mmHg    TV Vmax:        1.32 m/s MV Mean grad:  8.3 mmHg     TV Vmean:       94.7 cm/s MV Vmax:       2.12 m/s     TV VTI:         0.33 msec MV Vmean:      130.3 cm/s MV Decel Time: 387 msec     SHUNTS MV E velocity: 189.00 cm/s  Systemic VTI:  0.28 m                             Systemic Diam: 1.90 cm Carolan Clines Electronically signed by Carolan Clines Signature Date/Time: 07/17/2023/11:16:48 AM    Final     Medications:     Scheduled Medications:  acyclovir  200 mg Per Tube BID   aspirin  81 mg Per Tube Daily   atorvastatin  10 mg Per Tube QHS   azelastine  2 spray Each Nare BID   Chlorhexidine Gluconate Cloth  6 each Topical Daily   cinacalcet  30 mg Oral Q supper   colchicine  0.3 mg Per Tube Once every 5 days   darbepoetin (ARANESP) injection - DIALYSIS  150 mcg Subcutaneous Q Tue-1800   escitalopram  10 mg Per Tube Daily   folic acid   1 mg Per Tube Daily   guaiFENesin  15 mL Per Tube BID   heparin injection (subcutaneous)  5,000 Units Subcutaneous Q8H   [START ON 07/20/2023] influenza vac split trivalent PF  0.5 mL Intramuscular Once   insulin aspart  0-9 Units Subcutaneous Q4H   lidocaine  1 patch Transdermal Q24H   lipase/protease/amylase)  20,880 Units Per Tube Once   And   sodium bicarbonate  650 mg Per Tube Once   midodrine  30 mg Per Tube TID   multivitamin  1 tablet Per Tube BID   mouth rinse  15 mL Mouth Rinse 4 times per day   pantoprazole (PROTONIX) IV  40 mg Intravenous Q12H   QUEtiapine  12.5 mg Per Tube QHS   sodium chloride flush  3 mL Intravenous Q12H   thiamine  100 mg Oral Daily    Infusions:   prismasol BGK 4/2.5 400 mL/hr at 07/18/23 0304    prismasol BGK 4/2.5 400 mL/hr at 07/17/23 2030   sodium chloride 10 mL/hr at 07/18/23 0800   sodium chloride 10 mL/hr at 07/10/2023 1400   albumin human Stopped (06/23/23 0040)   epinephrine Stopped (07/18/23 0703)   feeding supplement (PIVOT 1.5 CAL) 70 mL/hr at 07/18/23 0800   norepinephrine (LEVOPHED) Adult infusion Stopped (07/13/23 0720)   prismasol BGK 4/2.5 1,800 mL/hr at 07/18/23 0546   vasopressin 0.04 Units/min (07/18/23 0800)    PRN Medications: sodium chloride, sodium chloride, acetaminophen, albumin human, bisacodyl **OR** bisacodyl, clonazepam, docusate, heparin, magic mouthwash, ondansetron (ZOFRAN) IV, mouth rinse, oxyCODONE, phenol, polyethylene glycol, simethicone, sodium chloride flush  Patient Profile   52 y/o AAM w/ mildly reduced systolic heart failure (EF once 45-50% in 2013 but normal on subsequent echos) valvular heart disease including severe AS, 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 EF 55-60% w/  VTI:   1.46 cm     TV Mean grad:   3.7 mmHg MV Peak grad:  18.0 mmHg    TV Vmax:        1.32 m/s MV Mean grad:  8.3 mmHg     TV Vmean:       94.7 cm/s MV Vmax:       2.12 m/s     TV VTI:         0.33 msec MV Vmean:      130.3 cm/s MV Decel Time: 387 msec     SHUNTS MV E velocity: 189.00 cm/s  Systemic VTI:  0.28 m                             Systemic Diam: 1.90 cm Carolan Clines Electronically signed by Carolan Clines Signature Date/Time: 07/17/2023/11:16:48 AM    Final     Medications:     Scheduled Medications:  acyclovir  200 mg Per Tube BID   aspirin  81 mg Per Tube Daily   atorvastatin  10 mg Per Tube QHS   azelastine  2 spray Each Nare BID   Chlorhexidine Gluconate Cloth  6 each Topical Daily   cinacalcet  30 mg Oral Q supper   colchicine  0.3 mg Per Tube Once every 5 days   darbepoetin (ARANESP) injection - DIALYSIS  150 mcg Subcutaneous Q Tue-1800   escitalopram  10 mg Per Tube Daily   folic acid   1 mg Per Tube Daily   guaiFENesin  15 mL Per Tube BID   heparin injection (subcutaneous)  5,000 Units Subcutaneous Q8H   [START ON 07/20/2023] influenza vac split trivalent PF  0.5 mL Intramuscular Once   insulin aspart  0-9 Units Subcutaneous Q4H   lidocaine  1 patch Transdermal Q24H   lipase/protease/amylase)  20,880 Units Per Tube Once   And   sodium bicarbonate  650 mg Per Tube Once   midodrine  30 mg Per Tube TID   multivitamin  1 tablet Per Tube BID   mouth rinse  15 mL Mouth Rinse 4 times per day   pantoprazole (PROTONIX) IV  40 mg Intravenous Q12H   QUEtiapine  12.5 mg Per Tube QHS   sodium chloride flush  3 mL Intravenous Q12H   thiamine  100 mg Oral Daily    Infusions:   prismasol BGK 4/2.5 400 mL/hr at 07/18/23 0304    prismasol BGK 4/2.5 400 mL/hr at 07/17/23 2030   sodium chloride 10 mL/hr at 07/18/23 0800   sodium chloride 10 mL/hr at 07/10/2023 1400   albumin human Stopped (06/23/23 0040)   epinephrine Stopped (07/18/23 0703)   feeding supplement (PIVOT 1.5 CAL) 70 mL/hr at 07/18/23 0800   norepinephrine (LEVOPHED) Adult infusion Stopped (07/13/23 0720)   prismasol BGK 4/2.5 1,800 mL/hr at 07/18/23 0546   vasopressin 0.04 Units/min (07/18/23 0800)    PRN Medications: sodium chloride, sodium chloride, acetaminophen, albumin human, bisacodyl **OR** bisacodyl, clonazepam, docusate, heparin, magic mouthwash, ondansetron (ZOFRAN) IV, mouth rinse, oxyCODONE, phenol, polyethylene glycol, simethicone, sodium chloride flush  Patient Profile   52 y/o AAM w/ mildly reduced systolic heart failure (EF once 45-50% in 2013 but normal on subsequent echos) valvular heart disease including severe AS, 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 EF 55-60% w/

## 2023-07-18 NOTE — Consult Note (Addendum)
Chief Complaint: Patient was seen in consultation today for tunneled hemodialysis cathete placement at the request of Dr Britt Bottom  Supervising Physician: Gilmer Mor  Patient Status: [facility] - In-pt  History of Present Illness: [name] is a 52 y.o. 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 Underwent aortic biovalve replacement, TV repair, MV repair, bilat Maze procedures.  - AVF ligated for CHF. Currently using temp HD cath in internal jugular  On CRRT to now-- started CRRT 8/16--- catheter placed by PCCM Recent Rt AV fistula ligation--- unable to use fistula  For pacemaker today per RN Cards want tunn HD cath place today too if can Tube feeds off at 1030 am  Planned for possible tunneled HD catheter placement in IR  Past Medical History:  Diagnosis Date   ADHD (attention deficit hyperactivity disorder)    Anemia    Anemia, chronic disease 07/13/2011   Anxiety    Arthritis    Bipolar 2 disorder (HCC) 08/18/2011   Diagnosed in 08-18-2010.    Bipolar disorder (HCC)    Blood dyscrasia    Nephrotic Anemia   Blood transfusion    Blood transfusion without reported diagnosis    Chronic diastolic CHF (congestive heart failure) (HCC)    Complication of anesthesia    Woke up intubated gets combative  afraid to be alone    Congestive heart failure (CHF) (HCC) 07/13/2011   Onset 08-18-05.  Followed by Dr. Eden Emms.  S/p cardiac catheterization in [facility] Dr. Anne Fu..  S/p cardiac catheterization in 08-18-2013 by Nishan.  Echo Aug 18, 2013.    Coronary artery disease    Depression    Dysrhythmia    A. Fib 19-Aug-2023 with DCCV in May 2024   Erectile dysfunction    ESRD on hemodialysis (HCC) 07/13/2011   Glomerulonephritis at age 57, started HD in August 19, 1995.  Deceased donor renal transplant 08-18-98 at [facility], then kidney failed and went back on HD in 18-Aug-2005.  L forearm AVF . s/p repeat renal transplant 10/2014  [facility].   GI bleed 11/01/2011   Rectal bleeding with emesis/diarrhea    Heart murmur    Hemodialysis status (HCC)    HSV (herpes simplex virus) infection    Hydrocele, right 11/25/2014   Hypertension    Hypertensive emergency 01/06/2013   Hypoglycemia 01/11/2013   Influenza-like illness 12/18/2013   Mild ascending aorta dilatation (HCC)    NICM (nonischemic cardiomyopathy) (HCC)    Pericardial effusion    a. Mod by echo in 08-18-2013, similar to prior.   Peripheral polyneuropathy 08/04/2016   Pneumonia    Pulmonary edema 01/06/2013   Pulmonary hypertension associated with end stage renal disease on dialysis [facility]) 07/13/2011   Renal transplant, status post 11/25/2014   Pt with Glomerulonephritis at age 37. Deceased donor renal transplant 08/18/1998 right and left renal transplant 10/2014.  Followed by [facility] transplant team; nephrologist is Dr. Lonna Cobb.    Respiratory failure with hypoxia (HCC) 01/06/2013   Shortness of breath 12/12/2012   Sleep apnea    Swollen testicle 07/13/2011   URI (upper respiratory infection) 01/25/2012    Past Surgical History:  Procedure Laterality Date   ANGIOPLASTY     AORTIC VALVE REPLACEMENT N/A 06/11/2023   Procedure: AORTIC VALVE REPLACEMENT (AVR) inspiris valve size 27;  Surgeon: Eugenio Hoes, MD;  Location: [facility] OR;  Service: Open Heart Surgery;  Laterality: N/A;  postoperative heart. Central lines and feeding tube remain in place. Right chest tube in place with no visible pneumothorax. Unchanged hazy density at the left more than right bases. IMPRESSION: Unchanged hardware positioning. Unchanged atelectasis and vascular congestion. No visible pneumothorax. Electronically Signed   By: Tiburcio Pea M.D.   On: 07/01/2023 10:04   DG Abd 1 View  Result Date: 06/30/2023 CLINICAL DATA:  Impacted stool in intestine. EXAM: ABDOMEN - 1 VIEW COMPARISON:  Radiograph 06/08/2023 FINDINGS: Tip of the weighted enteric tube in the right upper abdomen in the region of the distal stomach. There is mild gaseous gastric distension. Gaseous distension of transverse colon. No definite formed colonic stool. No small bowel distension. IMPRESSION: 1. Gaseous distension of transverse colon. No significant formed stool. No small bowel distension. 2. Weighted enteric  tube tip in the distal stomach. Electronically Signed   By: Narda Rutherford M.D.   On: 06/30/2023 20:40   ECHOCARDIOGRAM LIMITED  Result Date: 06/29/2023    ECHOCARDIOGRAM LIMITED REPORT   Patient Name:   [name] Date of Exam: 06/29/2023 Medical Rec #:  [id number]  Height:       75.0 in Accession #:    [id number] Weight:       212.5 lb Date of Birth:  [birth year]  BSA:          2.252 m Patient Age:    52 years   BP:           147/109 mmHg Patient Gender: M          HR:           82 bpm. Exam Location:  Inpatient Procedure: Limited Echo, Limited Color Doppler and Cardiac Doppler Indications:    Congestive Heart Failure  History:        Patient has prior history of Echocardiogram examinations, most                 recent 07/01/2023. CHF and Cardiomyopathy, Pulmonary HTN, Aortic                 Valve Disease and Mitral Valve Disease; Risk                 Factors:Hypertension and Former Smoker.                 Aortic Valve: 27 mm bioprosthetic valve is present in the aortic                 position. Procedure Date: 07/05/2023.                 Mitral Valve: valve is present in the mitral position. Procedure                 Date: 07/06/2023.  Sonographer:    Raeford Razor RDCS Referring Phys: [phone] ADITYA SABHARWAL IMPRESSIONS  1. Left ventricular ejection fraction, by estimation, is 60 to 65%. The left ventricle has normal function. The left ventricle has no regional wall motion abnormalities.  2. A small pericardial effusion is present. There is no evidence of cardiac tamponade.  3. S/P neochords, 30 mm ring, and suture. The mitral valve has been repaired/replaced. Trivial mitral valve regurgitation. The mean mitral valve gradient is 9.0 mmHg with average heart rate of 82 bpm. There is a present in the mitral position. Procedure  Date: 06/13/2023.  4. The tricuspid valve is has been repaired/replaced. The tricuspid valve is status post repair with an annuloplasty ring. Tricuspid valve regurgitation  Chief Complaint: Patient was seen in consultation today for tunneled hemodialysis cathete placement at the request of Dr Britt Bottom  Supervising Physician: Gilmer Mor  Patient Status: [facility] - In-pt  History of Present Illness: [name] is a 52 y.o. 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 Underwent aortic biovalve replacement, TV repair, MV repair, bilat Maze procedures.  - AVF ligated for CHF. Currently using temp HD cath in internal jugular  On CRRT to now-- started CRRT 8/16--- catheter placed by PCCM Recent Rt AV fistula ligation--- unable to use fistula  For pacemaker today per RN Cards want tunn HD cath place today too if can Tube feeds off at 1030 am  Planned for possible tunneled HD catheter placement in IR  Past Medical History:  Diagnosis Date   ADHD (attention deficit hyperactivity disorder)    Anemia    Anemia, chronic disease 07/13/2011   Anxiety    Arthritis    Bipolar 2 disorder (HCC) 08/18/2011   Diagnosed in 08-18-2010.    Bipolar disorder (HCC)    Blood dyscrasia    Nephrotic Anemia   Blood transfusion    Blood transfusion without reported diagnosis    Chronic diastolic CHF (congestive heart failure) (HCC)    Complication of anesthesia    Woke up intubated gets combative  afraid to be alone    Congestive heart failure (CHF) (HCC) 07/13/2011   Onset 08-18-05.  Followed by Dr. Eden Emms.  S/p cardiac catheterization in [facility] Dr. Anne Fu..  S/p cardiac catheterization in 08-18-2013 by Nishan.  Echo Aug 18, 2013.    Coronary artery disease    Depression    Dysrhythmia    A. Fib 19-Aug-2023 with DCCV in May 2024   Erectile dysfunction    ESRD on hemodialysis (HCC) 07/13/2011   Glomerulonephritis at age 57, started HD in August 19, 1995.  Deceased donor renal transplant 08-18-98 at [facility], then kidney failed and went back on HD in 18-Aug-2005.  L forearm AVF . s/p repeat renal transplant 10/2014  [facility].   GI bleed 11/01/2011   Rectal bleeding with emesis/diarrhea    Heart murmur    Hemodialysis status (HCC)    HSV (herpes simplex virus) infection    Hydrocele, right 11/25/2014   Hypertension    Hypertensive emergency 01/06/2013   Hypoglycemia 01/11/2013   Influenza-like illness 12/18/2013   Mild ascending aorta dilatation (HCC)    NICM (nonischemic cardiomyopathy) (HCC)    Pericardial effusion    a. Mod by echo in 08-18-2013, similar to prior.   Peripheral polyneuropathy 08/04/2016   Pneumonia    Pulmonary edema 01/06/2013   Pulmonary hypertension associated with end stage renal disease on dialysis [facility]) 07/13/2011   Renal transplant, status post 11/25/2014   Pt with Glomerulonephritis at age 37. Deceased donor renal transplant 08/18/1998 right and left renal transplant 10/2014.  Followed by [facility] transplant team; nephrologist is Dr. Lonna Cobb.    Respiratory failure with hypoxia (HCC) 01/06/2013   Shortness of breath 12/12/2012   Sleep apnea    Swollen testicle 07/13/2011   URI (upper respiratory infection) 01/25/2012    Past Surgical History:  Procedure Laterality Date   ANGIOPLASTY     AORTIC VALVE REPLACEMENT N/A 06/11/2023   Procedure: AORTIC VALVE REPLACEMENT (AVR) inspiris valve size 27;  Surgeon: Eugenio Hoes, MD;  Location: [facility] OR;  Service: Open Heart Surgery;  Laterality: N/A;  MITRAL VALVE                TRICUSPID VALVE MV Area (PHT): 1.73 cm     TR Peak grad:   48.7 mmHg MV Area VTI:   1.13 cm     TR Mean grad:   33.0 mmHg MV Peak grad:  18.0 mmHg    TR Vmax:        349.00 cm/s MV Mean grad:  6.0 mmHg     TR Vmean:       278.0 cm/s MV Vmax:       2.12 m/s MV Vmean:      109.0 cm/s   SHUNTS MV E velocity: 194.00 cm/s  Systemic VTI:  0.29 m MV A velocity: 73.70 cm/s   Systemic Diam: 2.10 cm MV E/A ratio:  2.63 Riley Lam MD Electronically signed by Riley Lam MD Signature Date/Time: 07/06/2023/3:48:39 PM    Final     CARDIAC CATHETERIZATION  Result Date: 06/25/2023 1.  Successful right internal jugular temporary pacemaker with a threshold of 0.3 mA, and output of 5 mA, and a backup rate of 30 bpm. Recommendations: Postprocedural single view chest x-ray and management per cardiothoracic, advanced heart failure, and EP teams.   DG Chest Port 1 View  Result Date: 06/18/2023 CLINICAL DATA:  Aortic valve replacement, prior tricuspid repair EXAM: PORTABLE CHEST 1 VIEW COMPARISON:  07/03/2023 FINDINGS: Feeding tube enters the stomach. Right subclavian central line tip: SVC. Left IJ line tip appears to be at the SVC confluence, tip oriented towards the lateral wall but sitting about 9 mm medial to the lateral wall. Prosthetic valves noted. Continued moderate to prominent enlargement of the cardiopericardial silhouette. Improved interstitial edema. Persistent pulmonary venous hypertension. Indistinct retrocardiac airspace opacity persists. IMPRESSION: 1. Improved interstitial edema. 2. Persistent pulmonary venous hypertension. Moderate to prominent enlargement of the cardiopericardial silhouette. 3. Persistent retrocardiac airspace opacity, potentially from atelectasis or pneumonia. Electronically Signed   By: Gaylyn Rong M.D.   On: 06/18/2023 10:55   DG CHEST PORT 1 VIEW  Result Date: 07/03/2023 CLINICAL DATA:  Central line placement EXAM: PORTABLE CHEST 1 VIEW COMPARISON:  07/02/2023 FINDINGS: Cardiomegaly. Possible mild perihilar edema, equivocal. No definite pleural effusions. No pneumothorax. Enteric tube courses into the stomach. Left IJ venous catheter terminates in the mid SVC. New right subclavian venous catheter terminates in the mid SVC, 4 cm above the cavoatrial junction. Prosthetic valve.  Median sternotomy. IMPRESSION: New right subclavian venous catheter terminates in the mid SVC, 4 cm above the cavoatrial junction. No pneumothorax. Otherwise unchanged. Electronically Signed   By: Charline Bills  M.D.   On: 07/03/2023 11:22   DG Chest Port 1 View  Result Date: 07/02/2023 CLINICAL DATA:  Atelectasis EXAM: PORTABLE CHEST 1 VIEW COMPARISON:  07/01/2023 FINDINGS: Unchanged AP portable chest radiograph. Gross cardiomegaly status post median sternotomy with aortic valve prosthesis. Right neck vascular sheath. Left neck multi lumen vascular catheter. Partially imaged enteric feeding tube. Right-sided pigtail chest tube. No significant pneumothorax. Mild diffuse interstitial opacity and small left pleural effusion. Retrocardiac atelectasis. IMPRESSION: 1. Right-sided pigtail chest tube. No significant pneumothorax. 2. Mild diffuse interstitial opacity and small left pleural effusion. Retrocardiac atelectasis. 3. Gross cardiomegaly. 4. Unchanged support apparatus. Electronically Signed   By: Jearld Lesch M.D.   On: 07/02/2023 10:08   DG Chest Port 1 View  Result Date: 07/01/2023 CLINICAL DATA:  Aortic valve replacement EXAM: PORTABLE CHEST 1 VIEW COMPARISON:  Two days ago FINDINGS: Chronic cardiopericardial enlargement with  Normal flow hemodynamics were seen in the left subclavian artery. *See table(s) above for measurements and observations.  Electronically signed by Coral Else MD on 06/20/2023 at 9:08:34 PM.    Final     Labs:  CBC: Recent Labs    07/11/23 0334 07/11/23 0803 08/06/2023 1038 07/16/23 0324 07/17/23 0455 07/18/23 0423  WBC 5.5  --   --  7.1 6.1 7.2  HGB 7.8*   < > 9.9*  9.5* 7.9* 7.3* 7.7*  HCT 26.3*   < > 29.0*  28.0* 26.7* 25.0* 26.6*  PLT 151  --   --  240 236 245   < > = values in this interval not displayed.    COAGS: Recent Labs    06/20/23 1130 07/07/2023 1618  INR 1.5* 2.8*  APTT 39* 61*    BMP: Recent Labs    07/16/23 1600 07/17/23 0454 07/17/23 1600 07/18/23 0423  NA 132* 133*  131* 131*  K 4.9 4.5 4.6 4.8  CL 101 102 100 100  CO2 26 26 27 26   GLUCOSE 117* 130* 108* 100*  BUN 24* 18 18 22*  CALCIUM 11.1* 11.2* 11.2* 11.7*  CREATININE 1.73* 1.56* 1.54* 1.61*  GFRNONAA 47* 54* 55* 52*    LIVER FUNCTION TESTS: Recent Labs    07/03/23 0417 07/03/23 1530 06/28/2023 0408 06/23/2023 1518 07/05/23 0452 07/05/23 1609 07/16/23 1600 07/17/23 0454 07/17/23 1600 07/18/23 0423  BILITOT 1.2  --  0.8  --  1.0  --   --   --   --  0.5  AST 48*  --  49*  --  46*  --   --   --   --  45*  ALT 12  --  13  --  13  --   --   --   --  17  ALKPHOS 98  --  89  --  78  --   --   --   --  117  PROT 8.1  --  7.7  --  8.1  --   --   --   --  8.9*  ALBUMIN 2.5*  2.5*   < > 2.4*   < > 2.4*  2.4*   < > 2.4* 2.3* 2.3* 2.3*   < > = values in this interval not displayed.    TUMOR MARKERS: No results for input(s): "AFPTM", "CEA", "CA199", "CHROMGRNA" in the last 8760 hours.  Assessment and Plan:  Scheduled for tunneled hemodialysis catheter placement in IR Recent surgery Right AV fistula ligation Unable to use dialysis fistula Non tunneled cath in place for ongoing CRRT Risks and benefits discussed with the patient's Aunt [name] via phone including, but not limited to bleeding, infection, vascular injury, pneumothorax which may require chest tube placement, air embolism or even death  All  questions were answered, [name] is agreeable to proceed. Consent signed and in chart.  Thank you for this interesting consult.  I greatly enjoyed meeting [facility] and look forward to participating in their care.  A copy of this report was sent to the requesting provider on this date.  Electronically Signed: Robet Leu, PA-C 07/18/2023, 10:36 AM   I spent a total of 20 Minutes    in face to face in clinical consultation, greater than 50% of which was counseling/coordinating care for tunneled hemodialysis catheter placement  mild interstitial edema. No definite pleural effusions. No pneumothorax. New right IJ transvenous pacemaker lead, in satisfactory position. Left IJ dual lumen dialysis catheter terminating in the mid SVC. Right subclavian venous catheter terminating in the mid SVC. Enteric tube coursing into the stomach. Prosthetic valves.  Median sternotomy. IMPRESSION: New right IJ transvenous pacemaker lead, in satisfactory position. No pneumothorax. Cardiomegaly with mild interstitial edema. Electronically Signed   By: Charline Bills  M.D.   On: 06/30/2023 16:22   ECHOCARDIOGRAM COMPLETE  Result Date: 06/30/2023    ECHOCARDIOGRAM REPORT   Patient Name:   [name] Date of Exam: 06/24/2023 Medical Rec #:  [id number]  Height:       75.0 in Accession #:    [id number] Weight:       186.1 lb Date of Birth:  [birth year]  BSA:          2.128 m Patient Age:    52 years   BP:           0/0 mmHg Patient Gender: M          HR:           47 bpm.47 Exam Location:  Inpatient Procedure: 2D Echo Indications:    Prior Valve disease, unclear blood pressure  History:        Patient has prior history of Echocardiogram examinations. Aortic                 Valve Disease and Mitral Valve Disease.  Sonographer:    Data processing manager Referring Phys: [phone] DALTON S MCLEAN IMPRESSIONS  1. Left ventricular ejection fraction, by estimation, is 70 to 75%. The left ventricle has hyperdynamic function. The left ventricle has no regional wall motion abnormalities. There is severe concentric left ventricular hypertrophy. Left ventricular diastolic parameters are indeterminate.  2. Right ventricular systolic function is moderately reduced. The right ventricular size is severely enlarged. Mildly increased right ventricular wall thickness.  3. Left atrial size was moderately dilated.  4. Right atrial size was moderately dilated.  5. Large pericardial effusion. The pericardial effusion is lateral to the left ventricle and circumferential. There is no evidence of cardiac tamponade.  6. 2D MVA 3.16 cm2. 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 The mitral valve has been repaired/replaced. No evidence of mitral valve regurgitation.  7. Mean gradient 4.4 mm Hg at a heart rate of 54 bpm. The tricuspid valve is has been repaired/replaced.  8. The aortic valve has been repaired/replaced. Aortic valve regurgitation is not visualized. Aortic valve mean gradient measures 18.0 mmHg. Aortic valve acceleration time measures 86 msec. Comparison(s): Prior images reviewed side by side. Mitral  mean gradients have improved at lower heart rates. Pericardial effusion size has increased. Aortic flows not well seen on prior study. FINDINGS  Left Ventricle: Left ventricular ejection fraction, by estimation, is 70 to 75%. The left ventricle has hyperdynamic function. The left ventricle has no regional wall motion abnormalities. The left ventricular internal cavity size was normal in size. There is severe concentric left ventricular hypertrophy. Left ventricular diastolic parameters are indeterminate. Right Ventricle: The right ventricular size is severely enlarged. Mildly increased right ventricular wall thickness. Right ventricular systolic function is moderately reduced. Left Atrium: Left atrial size was moderately dilated. Right Atrium: Right atrial size was moderately dilated. Pericardium: A large pericardial effusion is present. The pericardial effusion is lateral to the left ventricle and circumferential. There is no evidence of cardiac tamponade. Mitral Valve: 2D MVA 3.16 cm2. EOAi 1.38 cm2/m2 suggesting  postoperative heart. Central lines and feeding tube remain in place. Right chest tube in place with no visible pneumothorax. Unchanged hazy density at the left more than right bases. IMPRESSION: Unchanged hardware positioning. Unchanged atelectasis and vascular congestion. No visible pneumothorax. Electronically Signed   By: Tiburcio Pea M.D.   On: 07/01/2023 10:04   DG Abd 1 View  Result Date: 06/30/2023 CLINICAL DATA:  Impacted stool in intestine. EXAM: ABDOMEN - 1 VIEW COMPARISON:  Radiograph 06/08/2023 FINDINGS: Tip of the weighted enteric tube in the right upper abdomen in the region of the distal stomach. There is mild gaseous gastric distension. Gaseous distension of transverse colon. No definite formed colonic stool. No small bowel distension. IMPRESSION: 1. Gaseous distension of transverse colon. No significant formed stool. No small bowel distension. 2. Weighted enteric  tube tip in the distal stomach. Electronically Signed   By: Narda Rutherford M.D.   On: 06/30/2023 20:40   ECHOCARDIOGRAM LIMITED  Result Date: 06/29/2023    ECHOCARDIOGRAM LIMITED REPORT   Patient Name:   [name] Date of Exam: 06/29/2023 Medical Rec #:  [id number]  Height:       75.0 in Accession #:    [id number] Weight:       212.5 lb Date of Birth:  [birth year]  BSA:          2.252 m Patient Age:    52 years   BP:           147/109 mmHg Patient Gender: M          HR:           82 bpm. Exam Location:  Inpatient Procedure: Limited Echo, Limited Color Doppler and Cardiac Doppler Indications:    Congestive Heart Failure  History:        Patient has prior history of Echocardiogram examinations, most                 recent 07/01/2023. CHF and Cardiomyopathy, Pulmonary HTN, Aortic                 Valve Disease and Mitral Valve Disease; Risk                 Factors:Hypertension and Former Smoker.                 Aortic Valve: 27 mm bioprosthetic valve is present in the aortic                 position. Procedure Date: 07/05/2023.                 Mitral Valve: valve is present in the mitral position. Procedure                 Date: 07/06/2023.  Sonographer:    Raeford Razor RDCS Referring Phys: [phone] ADITYA SABHARWAL IMPRESSIONS  1. Left ventricular ejection fraction, by estimation, is 60 to 65%. The left ventricle has normal function. The left ventricle has no regional wall motion abnormalities.  2. A small pericardial effusion is present. There is no evidence of cardiac tamponade.  3. S/P neochords, 30 mm ring, and suture. The mitral valve has been repaired/replaced. Trivial mitral valve regurgitation. The mean mitral valve gradient is 9.0 mmHg with average heart rate of 82 bpm. There is a present in the mitral position. Procedure  Date: 06/13/2023.  4. The tricuspid valve is has been repaired/replaced. The tricuspid valve is status post repair with an annuloplasty ring. Tricuspid valve regurgitation  Chief Complaint: Patient was seen in consultation today for tunneled hemodialysis cathete placement at the request of Dr Britt Bottom  Supervising Physician: Gilmer Mor  Patient Status: [facility] - In-pt  History of Present Illness: [name] is a 52 y.o. 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 Underwent aortic biovalve replacement, TV repair, MV repair, bilat Maze procedures.  - AVF ligated for CHF. Currently using temp HD cath in internal jugular  On CRRT to now-- started CRRT 8/16--- catheter placed by PCCM Recent Rt AV fistula ligation--- unable to use fistula  For pacemaker today per RN Cards want tunn HD cath place today too if can Tube feeds off at 1030 am  Planned for possible tunneled HD catheter placement in IR  Past Medical History:  Diagnosis Date   ADHD (attention deficit hyperactivity disorder)    Anemia    Anemia, chronic disease 07/13/2011   Anxiety    Arthritis    Bipolar 2 disorder (HCC) 08/18/2011   Diagnosed in 08-18-2010.    Bipolar disorder (HCC)    Blood dyscrasia    Nephrotic Anemia   Blood transfusion    Blood transfusion without reported diagnosis    Chronic diastolic CHF (congestive heart failure) (HCC)    Complication of anesthesia    Woke up intubated gets combative  afraid to be alone    Congestive heart failure (CHF) (HCC) 07/13/2011   Onset 08-18-05.  Followed by Dr. Eden Emms.  S/p cardiac catheterization in [facility] Dr. Anne Fu..  S/p cardiac catheterization in 08-18-2013 by Nishan.  Echo Aug 18, 2013.    Coronary artery disease    Depression    Dysrhythmia    A. Fib 19-Aug-2023 with DCCV in May 2024   Erectile dysfunction    ESRD on hemodialysis (HCC) 07/13/2011   Glomerulonephritis at age 57, started HD in August 19, 1995.  Deceased donor renal transplant 08-18-98 at [facility], then kidney failed and went back on HD in 18-Aug-2005.  L forearm AVF . s/p repeat renal transplant 10/2014  [facility].   GI bleed 11/01/2011   Rectal bleeding with emesis/diarrhea    Heart murmur    Hemodialysis status (HCC)    HSV (herpes simplex virus) infection    Hydrocele, right 11/25/2014   Hypertension    Hypertensive emergency 01/06/2013   Hypoglycemia 01/11/2013   Influenza-like illness 12/18/2013   Mild ascending aorta dilatation (HCC)    NICM (nonischemic cardiomyopathy) (HCC)    Pericardial effusion    a. Mod by echo in 08-18-2013, similar to prior.   Peripheral polyneuropathy 08/04/2016   Pneumonia    Pulmonary edema 01/06/2013   Pulmonary hypertension associated with end stage renal disease on dialysis [facility]) 07/13/2011   Renal transplant, status post 11/25/2014   Pt with Glomerulonephritis at age 37. Deceased donor renal transplant 08/18/1998 right and left renal transplant 10/2014.  Followed by [facility] transplant team; nephrologist is Dr. Lonna Cobb.    Respiratory failure with hypoxia (HCC) 01/06/2013   Shortness of breath 12/12/2012   Sleep apnea    Swollen testicle 07/13/2011   URI (upper respiratory infection) 01/25/2012    Past Surgical History:  Procedure Laterality Date   ANGIOPLASTY     AORTIC VALVE REPLACEMENT N/A 06/11/2023   Procedure: AORTIC VALVE REPLACEMENT (AVR) inspiris valve size 27;  Surgeon: Eugenio Hoes, MD;  Location: [facility] OR;  Service: Open Heart Surgery;  Laterality: N/A;  MITRAL VALVE                TRICUSPID VALVE MV Area (PHT): 1.73 cm     TR Peak grad:   48.7 mmHg MV Area VTI:   1.13 cm     TR Mean grad:   33.0 mmHg MV Peak grad:  18.0 mmHg    TR Vmax:        349.00 cm/s MV Mean grad:  6.0 mmHg     TR Vmean:       278.0 cm/s MV Vmax:       2.12 m/s MV Vmean:      109.0 cm/s   SHUNTS MV E velocity: 194.00 cm/s  Systemic VTI:  0.29 m MV A velocity: 73.70 cm/s   Systemic Diam: 2.10 cm MV E/A ratio:  2.63 Riley Lam MD Electronically signed by Riley Lam MD Signature Date/Time: 07/06/2023/3:48:39 PM    Final     CARDIAC CATHETERIZATION  Result Date: 06/25/2023 1.  Successful right internal jugular temporary pacemaker with a threshold of 0.3 mA, and output of 5 mA, and a backup rate of 30 bpm. Recommendations: Postprocedural single view chest x-ray and management per cardiothoracic, advanced heart failure, and EP teams.   DG Chest Port 1 View  Result Date: 06/18/2023 CLINICAL DATA:  Aortic valve replacement, prior tricuspid repair EXAM: PORTABLE CHEST 1 VIEW COMPARISON:  07/03/2023 FINDINGS: Feeding tube enters the stomach. Right subclavian central line tip: SVC. Left IJ line tip appears to be at the SVC confluence, tip oriented towards the lateral wall but sitting about 9 mm medial to the lateral wall. Prosthetic valves noted. Continued moderate to prominent enlargement of the cardiopericardial silhouette. Improved interstitial edema. Persistent pulmonary venous hypertension. Indistinct retrocardiac airspace opacity persists. IMPRESSION: 1. Improved interstitial edema. 2. Persistent pulmonary venous hypertension. Moderate to prominent enlargement of the cardiopericardial silhouette. 3. Persistent retrocardiac airspace opacity, potentially from atelectasis or pneumonia. Electronically Signed   By: Gaylyn Rong M.D.   On: 06/18/2023 10:55   DG CHEST PORT 1 VIEW  Result Date: 07/03/2023 CLINICAL DATA:  Central line placement EXAM: PORTABLE CHEST 1 VIEW COMPARISON:  07/02/2023 FINDINGS: Cardiomegaly. Possible mild perihilar edema, equivocal. No definite pleural effusions. No pneumothorax. Enteric tube courses into the stomach. Left IJ venous catheter terminates in the mid SVC. New right subclavian venous catheter terminates in the mid SVC, 4 cm above the cavoatrial junction. Prosthetic valve.  Median sternotomy. IMPRESSION: New right subclavian venous catheter terminates in the mid SVC, 4 cm above the cavoatrial junction. No pneumothorax. Otherwise unchanged. Electronically Signed   By: Charline Bills  M.D.   On: 07/03/2023 11:22   DG Chest Port 1 View  Result Date: 07/02/2023 CLINICAL DATA:  Atelectasis EXAM: PORTABLE CHEST 1 VIEW COMPARISON:  07/01/2023 FINDINGS: Unchanged AP portable chest radiograph. Gross cardiomegaly status post median sternotomy with aortic valve prosthesis. Right neck vascular sheath. Left neck multi lumen vascular catheter. Partially imaged enteric feeding tube. Right-sided pigtail chest tube. No significant pneumothorax. Mild diffuse interstitial opacity and small left pleural effusion. Retrocardiac atelectasis. IMPRESSION: 1. Right-sided pigtail chest tube. No significant pneumothorax. 2. Mild diffuse interstitial opacity and small left pleural effusion. Retrocardiac atelectasis. 3. Gross cardiomegaly. 4. Unchanged support apparatus. Electronically Signed   By: Jearld Lesch M.D.   On: 07/02/2023 10:08   DG Chest Port 1 View  Result Date: 07/01/2023 CLINICAL DATA:  Aortic valve replacement EXAM: PORTABLE CHEST 1 VIEW COMPARISON:  Two days ago FINDINGS: Chronic cardiopericardial enlargement with  postoperative heart. Central lines and feeding tube remain in place. Right chest tube in place with no visible pneumothorax. Unchanged hazy density at the left more than right bases. IMPRESSION: Unchanged hardware positioning. Unchanged atelectasis and vascular congestion. No visible pneumothorax. Electronically Signed   By: Tiburcio Pea M.D.   On: 07/01/2023 10:04   DG Abd 1 View  Result Date: 06/30/2023 CLINICAL DATA:  Impacted stool in intestine. EXAM: ABDOMEN - 1 VIEW COMPARISON:  Radiograph 06/08/2023 FINDINGS: Tip of the weighted enteric tube in the right upper abdomen in the region of the distal stomach. There is mild gaseous gastric distension. Gaseous distension of transverse colon. No definite formed colonic stool. No small bowel distension. IMPRESSION: 1. Gaseous distension of transverse colon. No significant formed stool. No small bowel distension. 2. Weighted enteric  tube tip in the distal stomach. Electronically Signed   By: Narda Rutherford M.D.   On: 06/30/2023 20:40   ECHOCARDIOGRAM LIMITED  Result Date: 06/29/2023    ECHOCARDIOGRAM LIMITED REPORT   Patient Name:   [name] Date of Exam: 06/29/2023 Medical Rec #:  [id number]  Height:       75.0 in Accession #:    [id number] Weight:       212.5 lb Date of Birth:  [birth year]  BSA:          2.252 m Patient Age:    52 years   BP:           147/109 mmHg Patient Gender: M          HR:           82 bpm. Exam Location:  Inpatient Procedure: Limited Echo, Limited Color Doppler and Cardiac Doppler Indications:    Congestive Heart Failure  History:        Patient has prior history of Echocardiogram examinations, most                 recent 07/01/2023. CHF and Cardiomyopathy, Pulmonary HTN, Aortic                 Valve Disease and Mitral Valve Disease; Risk                 Factors:Hypertension and Former Smoker.                 Aortic Valve: 27 mm bioprosthetic valve is present in the aortic                 position. Procedure Date: 07/05/2023.                 Mitral Valve: valve is present in the mitral position. Procedure                 Date: 07/06/2023.  Sonographer:    Raeford Razor RDCS Referring Phys: [phone] ADITYA SABHARWAL IMPRESSIONS  1. Left ventricular ejection fraction, by estimation, is 60 to 65%. The left ventricle has normal function. The left ventricle has no regional wall motion abnormalities.  2. A small pericardial effusion is present. There is no evidence of cardiac tamponade.  3. S/P neochords, 30 mm ring, and suture. The mitral valve has been repaired/replaced. Trivial mitral valve regurgitation. The mean mitral valve gradient is 9.0 mmHg with average heart rate of 82 bpm. There is a present in the mitral position. Procedure  Date: 06/13/2023.  4. The tricuspid valve is has been repaired/replaced. The tricuspid valve is status post repair with an annuloplasty ring. Tricuspid valve regurgitation  Normal flow hemodynamics were seen in the left subclavian artery. *See table(s) above for measurements and observations.  Electronically signed by Coral Else MD on 06/20/2023 at 9:08:34 PM.    Final     Labs:  CBC: Recent Labs    07/11/23 0334 07/11/23 0803 08/06/2023 1038 07/16/23 0324 07/17/23 0455 07/18/23 0423  WBC 5.5  --   --  7.1 6.1 7.2  HGB 7.8*   < > 9.9*  9.5* 7.9* 7.3* 7.7*  HCT 26.3*   < > 29.0*  28.0* 26.7* 25.0* 26.6*  PLT 151  --   --  240 236 245   < > = values in this interval not displayed.    COAGS: Recent Labs    06/20/23 1130 07/07/2023 1618  INR 1.5* 2.8*  APTT 39* 61*    BMP: Recent Labs    07/16/23 1600 07/17/23 0454 07/17/23 1600 07/18/23 0423  NA 132* 133*  131* 131*  K 4.9 4.5 4.6 4.8  CL 101 102 100 100  CO2 26 26 27 26   GLUCOSE 117* 130* 108* 100*  BUN 24* 18 18 22*  CALCIUM 11.1* 11.2* 11.2* 11.7*  CREATININE 1.73* 1.56* 1.54* 1.61*  GFRNONAA 47* 54* 55* 52*    LIVER FUNCTION TESTS: Recent Labs    07/03/23 0417 07/03/23 1530 06/28/2023 0408 06/23/2023 1518 07/05/23 0452 07/05/23 1609 07/16/23 1600 07/17/23 0454 07/17/23 1600 07/18/23 0423  BILITOT 1.2  --  0.8  --  1.0  --   --   --   --  0.5  AST 48*  --  49*  --  46*  --   --   --   --  45*  ALT 12  --  13  --  13  --   --   --   --  17  ALKPHOS 98  --  89  --  78  --   --   --   --  117  PROT 8.1  --  7.7  --  8.1  --   --   --   --  8.9*  ALBUMIN 2.5*  2.5*   < > 2.4*   < > 2.4*  2.4*   < > 2.4* 2.3* 2.3* 2.3*   < > = values in this interval not displayed.    TUMOR MARKERS: No results for input(s): "AFPTM", "CEA", "CA199", "CHROMGRNA" in the last 8760 hours.  Assessment and Plan:  Scheduled for tunneled hemodialysis catheter placement in IR Recent surgery Right AV fistula ligation Unable to use dialysis fistula Non tunneled cath in place for ongoing CRRT Risks and benefits discussed with the patient's Aunt [name] via phone including, but not limited to bleeding, infection, vascular injury, pneumothorax which may require chest tube placement, air embolism or even death  All  questions were answered, [name] is agreeable to proceed. Consent signed and in chart.  Thank you for this interesting consult.  I greatly enjoyed meeting [facility] and look forward to participating in their care.  A copy of this report was sent to the requesting provider on this date.  Electronically Signed: Robet Leu, PA-C 07/18/2023, 10:36 AM   I spent a total of 20 Minutes    in face to face in clinical consultation, greater than 50% of which was counseling/coordinating care for tunneled hemodialysis catheter placement  Normal flow hemodynamics were seen in the left subclavian artery. *See table(s) above for measurements and observations.  Electronically signed by Coral Else MD on 06/20/2023 at 9:08:34 PM.    Final     Labs:  CBC: Recent Labs    07/11/23 0334 07/11/23 0803 08/06/2023 1038 07/16/23 0324 07/17/23 0455 07/18/23 0423  WBC 5.5  --   --  7.1 6.1 7.2  HGB 7.8*   < > 9.9*  9.5* 7.9* 7.3* 7.7*  HCT 26.3*   < > 29.0*  28.0* 26.7* 25.0* 26.6*  PLT 151  --   --  240 236 245   < > = values in this interval not displayed.    COAGS: Recent Labs    06/20/23 1130 07/07/2023 1618  INR 1.5* 2.8*  APTT 39* 61*    BMP: Recent Labs    07/16/23 1600 07/17/23 0454 07/17/23 1600 07/18/23 0423  NA 132* 133*  131* 131*  K 4.9 4.5 4.6 4.8  CL 101 102 100 100  CO2 26 26 27 26   GLUCOSE 117* 130* 108* 100*  BUN 24* 18 18 22*  CALCIUM 11.1* 11.2* 11.2* 11.7*  CREATININE 1.73* 1.56* 1.54* 1.61*  GFRNONAA 47* 54* 55* 52*    LIVER FUNCTION TESTS: Recent Labs    07/03/23 0417 07/03/23 1530 06/28/2023 0408 06/23/2023 1518 07/05/23 0452 07/05/23 1609 07/16/23 1600 07/17/23 0454 07/17/23 1600 07/18/23 0423  BILITOT 1.2  --  0.8  --  1.0  --   --   --   --  0.5  AST 48*  --  49*  --  46*  --   --   --   --  45*  ALT 12  --  13  --  13  --   --   --   --  17  ALKPHOS 98  --  89  --  78  --   --   --   --  117  PROT 8.1  --  7.7  --  8.1  --   --   --   --  8.9*  ALBUMIN 2.5*  2.5*   < > 2.4*   < > 2.4*  2.4*   < > 2.4* 2.3* 2.3* 2.3*   < > = values in this interval not displayed.    TUMOR MARKERS: No results for input(s): "AFPTM", "CEA", "CA199", "CHROMGRNA" in the last 8760 hours.  Assessment and Plan:  Scheduled for tunneled hemodialysis catheter placement in IR Recent surgery Right AV fistula ligation Unable to use dialysis fistula Non tunneled cath in place for ongoing CRRT Risks and benefits discussed with the patient's Aunt [name] via phone including, but not limited to bleeding, infection, vascular injury, pneumothorax which may require chest tube placement, air embolism or even death  All  questions were answered, [name] is agreeable to proceed. Consent signed and in chart.  Thank you for this interesting consult.  I greatly enjoyed meeting [facility] and look forward to participating in their care.  A copy of this report was sent to the requesting provider on this date.  Electronically Signed: Robet Leu, PA-C 07/18/2023, 10:36 AM   I spent a total of 20 Minutes    in face to face in clinical consultation, greater than 50% of which was counseling/coordinating care for tunneled hemodialysis catheter placement  Chief Complaint: Patient was seen in consultation today for tunneled hemodialysis cathete placement at the request of Dr Britt Bottom  Supervising Physician: Gilmer Mor  Patient Status: [facility] - In-pt  History of Present Illness: [name] is a 52 y.o. 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 Underwent aortic biovalve replacement, TV repair, MV repair, bilat Maze procedures.  - AVF ligated for CHF. Currently using temp HD cath in internal jugular  On CRRT to now-- started CRRT 8/16--- catheter placed by PCCM Recent Rt AV fistula ligation--- unable to use fistula  For pacemaker today per RN Cards want tunn HD cath place today too if can Tube feeds off at 1030 am  Planned for possible tunneled HD catheter placement in IR  Past Medical History:  Diagnosis Date   ADHD (attention deficit hyperactivity disorder)    Anemia    Anemia, chronic disease 07/13/2011   Anxiety    Arthritis    Bipolar 2 disorder (HCC) 08/18/2011   Diagnosed in 08-18-2010.    Bipolar disorder (HCC)    Blood dyscrasia    Nephrotic Anemia   Blood transfusion    Blood transfusion without reported diagnosis    Chronic diastolic CHF (congestive heart failure) (HCC)    Complication of anesthesia    Woke up intubated gets combative  afraid to be alone    Congestive heart failure (CHF) (HCC) 07/13/2011   Onset 08-18-05.  Followed by Dr. Eden Emms.  S/p cardiac catheterization in [facility] Dr. Anne Fu..  S/p cardiac catheterization in 08-18-2013 by Nishan.  Echo Aug 18, 2013.    Coronary artery disease    Depression    Dysrhythmia    A. Fib 19-Aug-2023 with DCCV in May 2024   Erectile dysfunction    ESRD on hemodialysis (HCC) 07/13/2011   Glomerulonephritis at age 57, started HD in August 19, 1995.  Deceased donor renal transplant 08-18-98 at [facility], then kidney failed and went back on HD in 18-Aug-2005.  L forearm AVF . s/p repeat renal transplant 10/2014  [facility].   GI bleed 11/01/2011   Rectal bleeding with emesis/diarrhea    Heart murmur    Hemodialysis status (HCC)    HSV (herpes simplex virus) infection    Hydrocele, right 11/25/2014   Hypertension    Hypertensive emergency 01/06/2013   Hypoglycemia 01/11/2013   Influenza-like illness 12/18/2013   Mild ascending aorta dilatation (HCC)    NICM (nonischemic cardiomyopathy) (HCC)    Pericardial effusion    a. Mod by echo in 08-18-2013, similar to prior.   Peripheral polyneuropathy 08/04/2016   Pneumonia    Pulmonary edema 01/06/2013   Pulmonary hypertension associated with end stage renal disease on dialysis [facility]) 07/13/2011   Renal transplant, status post 11/25/2014   Pt with Glomerulonephritis at age 37. Deceased donor renal transplant 08/18/1998 right and left renal transplant 10/2014.  Followed by [facility] transplant team; nephrologist is Dr. Lonna Cobb.    Respiratory failure with hypoxia (HCC) 01/06/2013   Shortness of breath 12/12/2012   Sleep apnea    Swollen testicle 07/13/2011   URI (upper respiratory infection) 01/25/2012    Past Surgical History:  Procedure Laterality Date   ANGIOPLASTY     AORTIC VALVE REPLACEMENT N/A 06/11/2023   Procedure: AORTIC VALVE REPLACEMENT (AVR) inspiris valve size 27;  Surgeon: Eugenio Hoes, MD;  Location: [facility] OR;  Service: Open Heart Surgery;  Laterality: N/A;  mild interstitial edema. No definite pleural effusions. No pneumothorax. New right IJ transvenous pacemaker lead, in satisfactory position. Left IJ dual lumen dialysis catheter terminating in the mid SVC. Right subclavian venous catheter terminating in the mid SVC. Enteric tube coursing into the stomach. Prosthetic valves.  Median sternotomy. IMPRESSION: New right IJ transvenous pacemaker lead, in satisfactory position. No pneumothorax. Cardiomegaly with mild interstitial edema. Electronically Signed   By: Charline Bills  M.D.   On: 06/30/2023 16:22   ECHOCARDIOGRAM COMPLETE  Result Date: 06/30/2023    ECHOCARDIOGRAM REPORT   Patient Name:   [name] Date of Exam: 06/24/2023 Medical Rec #:  [id number]  Height:       75.0 in Accession #:    [id number] Weight:       186.1 lb Date of Birth:  [birth year]  BSA:          2.128 m Patient Age:    52 years   BP:           0/0 mmHg Patient Gender: M          HR:           47 bpm.47 Exam Location:  Inpatient Procedure: 2D Echo Indications:    Prior Valve disease, unclear blood pressure  History:        Patient has prior history of Echocardiogram examinations. Aortic                 Valve Disease and Mitral Valve Disease.  Sonographer:    Data processing manager Referring Phys: [phone] DALTON S MCLEAN IMPRESSIONS  1. Left ventricular ejection fraction, by estimation, is 70 to 75%. The left ventricle has hyperdynamic function. The left ventricle has no regional wall motion abnormalities. There is severe concentric left ventricular hypertrophy. Left ventricular diastolic parameters are indeterminate.  2. Right ventricular systolic function is moderately reduced. The right ventricular size is severely enlarged. Mildly increased right ventricular wall thickness.  3. Left atrial size was moderately dilated.  4. Right atrial size was moderately dilated.  5. Large pericardial effusion. The pericardial effusion is lateral to the left ventricle and circumferential. There is no evidence of cardiac tamponade.  6. 2D MVA 3.16 cm2. 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 The mitral valve has been repaired/replaced. No evidence of mitral valve regurgitation.  7. Mean gradient 4.4 mm Hg at a heart rate of 54 bpm. The tricuspid valve is has been repaired/replaced.  8. The aortic valve has been repaired/replaced. Aortic valve regurgitation is not visualized. Aortic valve mean gradient measures 18.0 mmHg. Aortic valve acceleration time measures 86 msec. Comparison(s): Prior images reviewed side by side. Mitral  mean gradients have improved at lower heart rates. Pericardial effusion size has increased. Aortic flows not well seen on prior study. FINDINGS  Left Ventricle: Left ventricular ejection fraction, by estimation, is 70 to 75%. The left ventricle has hyperdynamic function. The left ventricle has no regional wall motion abnormalities. The left ventricular internal cavity size was normal in size. There is severe concentric left ventricular hypertrophy. Left ventricular diastolic parameters are indeterminate. Right Ventricle: The right ventricular size is severely enlarged. Mildly increased right ventricular wall thickness. Right ventricular systolic function is moderately reduced. Left Atrium: Left atrial size was moderately dilated. Right Atrium: Right atrial size was moderately dilated. Pericardium: A large pericardial effusion is present. The pericardial effusion is lateral to the left ventricle and circumferential. There is no evidence of cardiac tamponade. Mitral Valve: 2D MVA 3.16 cm2. EOAi 1.38 cm2/m2 suggesting  mild interstitial edema. No definite pleural effusions. No pneumothorax. New right IJ transvenous pacemaker lead, in satisfactory position. Left IJ dual lumen dialysis catheter terminating in the mid SVC. Right subclavian venous catheter terminating in the mid SVC. Enteric tube coursing into the stomach. Prosthetic valves.  Median sternotomy. IMPRESSION: New right IJ transvenous pacemaker lead, in satisfactory position. No pneumothorax. Cardiomegaly with mild interstitial edema. Electronically Signed   By: Charline Bills  M.D.   On: 06/30/2023 16:22   ECHOCARDIOGRAM COMPLETE  Result Date: 06/30/2023    ECHOCARDIOGRAM REPORT   Patient Name:   [name] Date of Exam: 06/24/2023 Medical Rec #:  [id number]  Height:       75.0 in Accession #:    [id number] Weight:       186.1 lb Date of Birth:  [birth year]  BSA:          2.128 m Patient Age:    52 years   BP:           0/0 mmHg Patient Gender: M          HR:           47 bpm.47 Exam Location:  Inpatient Procedure: 2D Echo Indications:    Prior Valve disease, unclear blood pressure  History:        Patient has prior history of Echocardiogram examinations. Aortic                 Valve Disease and Mitral Valve Disease.  Sonographer:    Data processing manager Referring Phys: [phone] DALTON S MCLEAN IMPRESSIONS  1. Left ventricular ejection fraction, by estimation, is 70 to 75%. The left ventricle has hyperdynamic function. The left ventricle has no regional wall motion abnormalities. There is severe concentric left ventricular hypertrophy. Left ventricular diastolic parameters are indeterminate.  2. Right ventricular systolic function is moderately reduced. The right ventricular size is severely enlarged. Mildly increased right ventricular wall thickness.  3. Left atrial size was moderately dilated.  4. Right atrial size was moderately dilated.  5. Large pericardial effusion. The pericardial effusion is lateral to the left ventricle and circumferential. There is no evidence of cardiac tamponade.  6. 2D MVA 3.16 cm2. 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 The mitral valve has been repaired/replaced. No evidence of mitral valve regurgitation.  7. Mean gradient 4.4 mm Hg at a heart rate of 54 bpm. The tricuspid valve is has been repaired/replaced.  8. The aortic valve has been repaired/replaced. Aortic valve regurgitation is not visualized. Aortic valve mean gradient measures 18.0 mmHg. Aortic valve acceleration time measures 86 msec. Comparison(s): Prior images reviewed side by side. Mitral  mean gradients have improved at lower heart rates. Pericardial effusion size has increased. Aortic flows not well seen on prior study. FINDINGS  Left Ventricle: Left ventricular ejection fraction, by estimation, is 70 to 75%. The left ventricle has hyperdynamic function. The left ventricle has no regional wall motion abnormalities. The left ventricular internal cavity size was normal in size. There is severe concentric left ventricular hypertrophy. Left ventricular diastolic parameters are indeterminate. Right Ventricle: The right ventricular size is severely enlarged. Mildly increased right ventricular wall thickness. Right ventricular systolic function is moderately reduced. Left Atrium: Left atrial size was moderately dilated. Right Atrium: Right atrial size was moderately dilated. Pericardium: A large pericardial effusion is present. The pericardial effusion is lateral to the left ventricle and circumferential. There is no evidence of cardiac tamponade. Mitral Valve: 2D MVA 3.16 cm2. EOAi 1.38 cm2/m2 suggesting  mild interstitial edema. No definite pleural effusions. No pneumothorax. New right IJ transvenous pacemaker lead, in satisfactory position. Left IJ dual lumen dialysis catheter terminating in the mid SVC. Right subclavian venous catheter terminating in the mid SVC. Enteric tube coursing into the stomach. Prosthetic valves.  Median sternotomy. IMPRESSION: New right IJ transvenous pacemaker lead, in satisfactory position. No pneumothorax. Cardiomegaly with mild interstitial edema. Electronically Signed   By: Charline Bills  M.D.   On: 06/30/2023 16:22   ECHOCARDIOGRAM COMPLETE  Result Date: 06/30/2023    ECHOCARDIOGRAM REPORT   Patient Name:   [name] Date of Exam: 06/24/2023 Medical Rec #:  [id number]  Height:       75.0 in Accession #:    [id number] Weight:       186.1 lb Date of Birth:  [birth year]  BSA:          2.128 m Patient Age:    52 years   BP:           0/0 mmHg Patient Gender: M          HR:           47 bpm.47 Exam Location:  Inpatient Procedure: 2D Echo Indications:    Prior Valve disease, unclear blood pressure  History:        Patient has prior history of Echocardiogram examinations. Aortic                 Valve Disease and Mitral Valve Disease.  Sonographer:    Data processing manager Referring Phys: [phone] DALTON S MCLEAN IMPRESSIONS  1. Left ventricular ejection fraction, by estimation, is 70 to 75%. The left ventricle has hyperdynamic function. The left ventricle has no regional wall motion abnormalities. There is severe concentric left ventricular hypertrophy. Left ventricular diastolic parameters are indeterminate.  2. Right ventricular systolic function is moderately reduced. The right ventricular size is severely enlarged. Mildly increased right ventricular wall thickness.  3. Left atrial size was moderately dilated.  4. Right atrial size was moderately dilated.  5. Large pericardial effusion. The pericardial effusion is lateral to the left ventricle and circumferential. There is no evidence of cardiac tamponade.  6. 2D MVA 3.16 cm2. 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 The mitral valve has been repaired/replaced. No evidence of mitral valve regurgitation.  7. Mean gradient 4.4 mm Hg at a heart rate of 54 bpm. The tricuspid valve is has been repaired/replaced.  8. The aortic valve has been repaired/replaced. Aortic valve regurgitation is not visualized. Aortic valve mean gradient measures 18.0 mmHg. Aortic valve acceleration time measures 86 msec. Comparison(s): Prior images reviewed side by side. Mitral  mean gradients have improved at lower heart rates. Pericardial effusion size has increased. Aortic flows not well seen on prior study. FINDINGS  Left Ventricle: Left ventricular ejection fraction, by estimation, is 70 to 75%. The left ventricle has hyperdynamic function. The left ventricle has no regional wall motion abnormalities. The left ventricular internal cavity size was normal in size. There is severe concentric left ventricular hypertrophy. Left ventricular diastolic parameters are indeterminate. Right Ventricle: The right ventricular size is severely enlarged. Mildly increased right ventricular wall thickness. Right ventricular systolic function is moderately reduced. Left Atrium: Left atrial size was moderately dilated. Right Atrium: Right atrial size was moderately dilated. Pericardium: A large pericardial effusion is present. The pericardial effusion is lateral to the left ventricle and circumferential. There is no evidence of cardiac tamponade. Mitral Valve: 2D MVA 3.16 cm2. EOAi 1.38 cm2/m2 suggesting  Normal flow hemodynamics were seen in the left subclavian artery. *See table(s) above for measurements and observations.  Electronically signed by Coral Else MD on 06/20/2023 at 9:08:34 PM.    Final     Labs:  CBC: Recent Labs    07/11/23 0334 07/11/23 0803 08/06/2023 1038 07/16/23 0324 07/17/23 0455 07/18/23 0423  WBC 5.5  --   --  7.1 6.1 7.2  HGB 7.8*   < > 9.9*  9.5* 7.9* 7.3* 7.7*  HCT 26.3*   < > 29.0*  28.0* 26.7* 25.0* 26.6*  PLT 151  --   --  240 236 245   < > = values in this interval not displayed.    COAGS: Recent Labs    06/20/23 1130 07/07/2023 1618  INR 1.5* 2.8*  APTT 39* 61*    BMP: Recent Labs    07/16/23 1600 07/17/23 0454 07/17/23 1600 07/18/23 0423  NA 132* 133*  131* 131*  K 4.9 4.5 4.6 4.8  CL 101 102 100 100  CO2 26 26 27 26   GLUCOSE 117* 130* 108* 100*  BUN 24* 18 18 22*  CALCIUM 11.1* 11.2* 11.2* 11.7*  CREATININE 1.73* 1.56* 1.54* 1.61*  GFRNONAA 47* 54* 55* 52*    LIVER FUNCTION TESTS: Recent Labs    07/03/23 0417 07/03/23 1530 06/28/2023 0408 06/23/2023 1518 07/05/23 0452 07/05/23 1609 07/16/23 1600 07/17/23 0454 07/17/23 1600 07/18/23 0423  BILITOT 1.2  --  0.8  --  1.0  --   --   --   --  0.5  AST 48*  --  49*  --  46*  --   --   --   --  45*  ALT 12  --  13  --  13  --   --   --   --  17  ALKPHOS 98  --  89  --  78  --   --   --   --  117  PROT 8.1  --  7.7  --  8.1  --   --   --   --  8.9*  ALBUMIN 2.5*  2.5*   < > 2.4*   < > 2.4*  2.4*   < > 2.4* 2.3* 2.3* 2.3*   < > = values in this interval not displayed.    TUMOR MARKERS: No results for input(s): "AFPTM", "CEA", "CA199", "CHROMGRNA" in the last 8760 hours.  Assessment and Plan:  Scheduled for tunneled hemodialysis catheter placement in IR Recent surgery Right AV fistula ligation Unable to use dialysis fistula Non tunneled cath in place for ongoing CRRT Risks and benefits discussed with the patient's Aunt [name] via phone including, but not limited to bleeding, infection, vascular injury, pneumothorax which may require chest tube placement, air embolism or even death  All  questions were answered, [name] is agreeable to proceed. Consent signed and in chart.  Thank you for this interesting consult.  I greatly enjoyed meeting [facility] and look forward to participating in their care.  A copy of this report was sent to the requesting provider on this date.  Electronically Signed: Robet Leu, PA-C 07/18/2023, 10:36 AM   I spent a total of 20 Minutes    in face to face in clinical consultation, greater than 50% of which was counseling/coordinating care for tunneled hemodialysis catheter placement  mild interstitial edema. No definite pleural effusions. No pneumothorax. New right IJ transvenous pacemaker lead, in satisfactory position. Left IJ dual lumen dialysis catheter terminating in the mid SVC. Right subclavian venous catheter terminating in the mid SVC. Enteric tube coursing into the stomach. Prosthetic valves.  Median sternotomy. IMPRESSION: New right IJ transvenous pacemaker lead, in satisfactory position. No pneumothorax. Cardiomegaly with mild interstitial edema. Electronically Signed   By: Charline Bills  M.D.   On: 06/30/2023 16:22   ECHOCARDIOGRAM COMPLETE  Result Date: 06/30/2023    ECHOCARDIOGRAM REPORT   Patient Name:   [name] Date of Exam: 06/24/2023 Medical Rec #:  [id number]  Height:       75.0 in Accession #:    [id number] Weight:       186.1 lb Date of Birth:  [birth year]  BSA:          2.128 m Patient Age:    52 years   BP:           0/0 mmHg Patient Gender: M          HR:           47 bpm.47 Exam Location:  Inpatient Procedure: 2D Echo Indications:    Prior Valve disease, unclear blood pressure  History:        Patient has prior history of Echocardiogram examinations. Aortic                 Valve Disease and Mitral Valve Disease.  Sonographer:    Data processing manager Referring Phys: [phone] DALTON S MCLEAN IMPRESSIONS  1. Left ventricular ejection fraction, by estimation, is 70 to 75%. The left ventricle has hyperdynamic function. The left ventricle has no regional wall motion abnormalities. There is severe concentric left ventricular hypertrophy. Left ventricular diastolic parameters are indeterminate.  2. Right ventricular systolic function is moderately reduced. The right ventricular size is severely enlarged. Mildly increased right ventricular wall thickness.  3. Left atrial size was moderately dilated.  4. Right atrial size was moderately dilated.  5. Large pericardial effusion. The pericardial effusion is lateral to the left ventricle and circumferential. There is no evidence of cardiac tamponade.  6. 2D MVA 3.16 cm2. 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 The mitral valve has been repaired/replaced. No evidence of mitral valve regurgitation.  7. Mean gradient 4.4 mm Hg at a heart rate of 54 bpm. The tricuspid valve is has been repaired/replaced.  8. The aortic valve has been repaired/replaced. Aortic valve regurgitation is not visualized. Aortic valve mean gradient measures 18.0 mmHg. Aortic valve acceleration time measures 86 msec. Comparison(s): Prior images reviewed side by side. Mitral  mean gradients have improved at lower heart rates. Pericardial effusion size has increased. Aortic flows not well seen on prior study. FINDINGS  Left Ventricle: Left ventricular ejection fraction, by estimation, is 70 to 75%. The left ventricle has hyperdynamic function. The left ventricle has no regional wall motion abnormalities. The left ventricular internal cavity size was normal in size. There is severe concentric left ventricular hypertrophy. Left ventricular diastolic parameters are indeterminate. Right Ventricle: The right ventricular size is severely enlarged. Mildly increased right ventricular wall thickness. Right ventricular systolic function is moderately reduced. Left Atrium: Left atrial size was moderately dilated. Right Atrium: Right atrial size was moderately dilated. Pericardium: A large pericardial effusion is present. The pericardial effusion is lateral to the left ventricle and circumferential. There is no evidence of cardiac tamponade. Mitral Valve: 2D MVA 3.16 cm2. EOAi 1.38 cm2/m2 suggesting  mild interstitial edema. No definite pleural effusions. No pneumothorax. New right IJ transvenous pacemaker lead, in satisfactory position. Left IJ dual lumen dialysis catheter terminating in the mid SVC. Right subclavian venous catheter terminating in the mid SVC. Enteric tube coursing into the stomach. Prosthetic valves.  Median sternotomy. IMPRESSION: New right IJ transvenous pacemaker lead, in satisfactory position. No pneumothorax. Cardiomegaly with mild interstitial edema. Electronically Signed   By: Charline Bills  M.D.   On: 06/30/2023 16:22   ECHOCARDIOGRAM COMPLETE  Result Date: 06/30/2023    ECHOCARDIOGRAM REPORT   Patient Name:   [name] Date of Exam: 06/24/2023 Medical Rec #:  [id number]  Height:       75.0 in Accession #:    [id number] Weight:       186.1 lb Date of Birth:  [birth year]  BSA:          2.128 m Patient Age:    52 years   BP:           0/0 mmHg Patient Gender: M          HR:           47 bpm.47 Exam Location:  Inpatient Procedure: 2D Echo Indications:    Prior Valve disease, unclear blood pressure  History:        Patient has prior history of Echocardiogram examinations. Aortic                 Valve Disease and Mitral Valve Disease.  Sonographer:    Data processing manager Referring Phys: [phone] DALTON S MCLEAN IMPRESSIONS  1. Left ventricular ejection fraction, by estimation, is 70 to 75%. The left ventricle has hyperdynamic function. The left ventricle has no regional wall motion abnormalities. There is severe concentric left ventricular hypertrophy. Left ventricular diastolic parameters are indeterminate.  2. Right ventricular systolic function is moderately reduced. The right ventricular size is severely enlarged. Mildly increased right ventricular wall thickness.  3. Left atrial size was moderately dilated.  4. Right atrial size was moderately dilated.  5. Large pericardial effusion. The pericardial effusion is lateral to the left ventricle and circumferential. There is no evidence of cardiac tamponade.  6. 2D MVA 3.16 cm2. 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 The mitral valve has been repaired/replaced. No evidence of mitral valve regurgitation.  7. Mean gradient 4.4 mm Hg at a heart rate of 54 bpm. The tricuspid valve is has been repaired/replaced.  8. The aortic valve has been repaired/replaced. Aortic valve regurgitation is not visualized. Aortic valve mean gradient measures 18.0 mmHg. Aortic valve acceleration time measures 86 msec. Comparison(s): Prior images reviewed side by side. Mitral  mean gradients have improved at lower heart rates. Pericardial effusion size has increased. Aortic flows not well seen on prior study. FINDINGS  Left Ventricle: Left ventricular ejection fraction, by estimation, is 70 to 75%. The left ventricle has hyperdynamic function. The left ventricle has no regional wall motion abnormalities. The left ventricular internal cavity size was normal in size. There is severe concentric left ventricular hypertrophy. Left ventricular diastolic parameters are indeterminate. Right Ventricle: The right ventricular size is severely enlarged. Mildly increased right ventricular wall thickness. Right ventricular systolic function is moderately reduced. Left Atrium: Left atrial size was moderately dilated. Right Atrium: Right atrial size was moderately dilated. Pericardium: A large pericardial effusion is present. The pericardial effusion is lateral to the left ventricle and circumferential. There is no evidence of cardiac tamponade. Mitral Valve: 2D MVA 3.16 cm2. EOAi 1.38 cm2/m2 suggesting  postoperative heart. Central lines and feeding tube remain in place. Right chest tube in place with no visible pneumothorax. Unchanged hazy density at the left more than right bases. IMPRESSION: Unchanged hardware positioning. Unchanged atelectasis and vascular congestion. No visible pneumothorax. Electronically Signed   By: Tiburcio Pea M.D.   On: 07/01/2023 10:04   DG Abd 1 View  Result Date: 06/30/2023 CLINICAL DATA:  Impacted stool in intestine. EXAM: ABDOMEN - 1 VIEW COMPARISON:  Radiograph 06/08/2023 FINDINGS: Tip of the weighted enteric tube in the right upper abdomen in the region of the distal stomach. There is mild gaseous gastric distension. Gaseous distension of transverse colon. No definite formed colonic stool. No small bowel distension. IMPRESSION: 1. Gaseous distension of transverse colon. No significant formed stool. No small bowel distension. 2. Weighted enteric  tube tip in the distal stomach. Electronically Signed   By: Narda Rutherford M.D.   On: 06/30/2023 20:40   ECHOCARDIOGRAM LIMITED  Result Date: 06/29/2023    ECHOCARDIOGRAM LIMITED REPORT   Patient Name:   [name] Date of Exam: 06/29/2023 Medical Rec #:  [id number]  Height:       75.0 in Accession #:    [id number] Weight:       212.5 lb Date of Birth:  [birth year]  BSA:          2.252 m Patient Age:    52 years   BP:           147/109 mmHg Patient Gender: M          HR:           82 bpm. Exam Location:  Inpatient Procedure: Limited Echo, Limited Color Doppler and Cardiac Doppler Indications:    Congestive Heart Failure  History:        Patient has prior history of Echocardiogram examinations, most                 recent 07/01/2023. CHF and Cardiomyopathy, Pulmonary HTN, Aortic                 Valve Disease and Mitral Valve Disease; Risk                 Factors:Hypertension and Former Smoker.                 Aortic Valve: 27 mm bioprosthetic valve is present in the aortic                 position. Procedure Date: 07/05/2023.                 Mitral Valve: valve is present in the mitral position. Procedure                 Date: 07/06/2023.  Sonographer:    Raeford Razor RDCS Referring Phys: [phone] ADITYA SABHARWAL IMPRESSIONS  1. Left ventricular ejection fraction, by estimation, is 60 to 65%. The left ventricle has normal function. The left ventricle has no regional wall motion abnormalities.  2. A small pericardial effusion is present. There is no evidence of cardiac tamponade.  3. S/P neochords, 30 mm ring, and suture. The mitral valve has been repaired/replaced. Trivial mitral valve regurgitation. The mean mitral valve gradient is 9.0 mmHg with average heart rate of 82 bpm. There is a present in the mitral position. Procedure  Date: 06/13/2023.  4. The tricuspid valve is has been repaired/replaced. The tricuspid valve is status post repair with an annuloplasty ring. Tricuspid valve regurgitation  Normal flow hemodynamics were seen in the left subclavian artery. *See table(s) above for measurements and observations.  Electronically signed by Coral Else MD on 06/20/2023 at 9:08:34 PM.    Final     Labs:  CBC: Recent Labs    07/11/23 0334 07/11/23 0803 08/05/2023 1038 07/16/23 0324 07/17/23 0455 07/18/23 0423  WBC 5.5  --   --  7.1 6.1 7.2  HGB 7.8*   < > 9.9*  9.5* 7.9* 7.3* 7.7*  HCT 26.3*   < > 29.0*  28.0* 26.7* 25.0* 26.6*  PLT 151  --   --  240 236 245   < > = values in this interval not displayed.    COAGS: Recent Labs    06/20/23 1130 07/03/2023 1618  INR 1.5* 2.8*  APTT 39* 61*    BMP: Recent Labs    07/16/23 1600 07/17/23 0454 07/17/23 1600 07/18/23 0423  NA 132* 133*  131* 131*  K 4.9 4.5 4.6 4.8  CL 101 102 100 100  CO2 26 26 27 26   GLUCOSE 117* 130* 108* 100*  BUN 24* 18 18 22*  CALCIUM 11.1* 11.2* 11.2* 11.7*  CREATININE 1.73* 1.56* 1.54* 1.61*  GFRNONAA 47* 54* 55* 52*    LIVER FUNCTION TESTS: Recent Labs    07/03/23 0417 07/03/23 1530 06/08/2023 0408 06/18/2023 1518 07/05/23 0452 07/05/23 1609 07/16/23 1600 07/17/23 0454 07/17/23 1600 07/18/23 0423  BILITOT 1.2  --  0.8  --  1.0  --   --   --   --  0.5  AST 48*  --  49*  --  46*  --   --   --   --  45*  ALT 12  --  13  --  13  --   --   --   --  17  ALKPHOS 98  --  89  --  78  --   --   --   --  117  PROT 8.1  --  7.7  --  8.1  --   --   --   --  8.9*  ALBUMIN 2.5*  2.5*   < > 2.4*   < > 2.4*  2.4*   < > 2.4* 2.3* 2.3* 2.3*   < > = values in this interval not displayed.    TUMOR MARKERS: No results for input(s): "AFPTM", "CEA", "CA199", "CHROMGRNA" in the last 8760 hours.  Assessment and Plan:  Scheduled for tunneled hemodialysis catheter placement in IR Recent surgery Right AV fistula ligation Unable to use dialysis fistula Non tunneled cath in place for ongoing CRRT Risks and benefits discussed with the patient's Aunt [name] via phone including, but not limited to bleeding, infection, vascular injury, pneumothorax which may require chest tube placement, air embolism or even death  All  questions were answered, [name] is agreeable to proceed. Consent signed and in chart.  Thank you for this interesting consult.  I greatly enjoyed meeting [facility] and look forward to participating in their care.  A copy of this report was sent to the requesting provider on this date.  Electronically Signed: Robet Leu, PA-C 07/18/2023, 10:36 AM   I spent a total of 20 Minutes    in face to face in clinical consultation, greater than 50% of which was counseling/coordinating care for tunneled hemodialysis catheter placement

## 2023-07-19 ENCOUNTER — Inpatient Hospital Stay (HOSPITAL_COMMUNITY): Payer: Medicare HMO

## 2023-07-19 DIAGNOSIS — I442 Atrioventricular block, complete: Secondary | ICD-10-CM | POA: Diagnosis not present

## 2023-07-19 DIAGNOSIS — Z9889 Other specified postprocedural states: Secondary | ICD-10-CM | POA: Diagnosis not present

## 2023-07-19 DIAGNOSIS — N186 End stage renal disease: Secondary | ICD-10-CM | POA: Diagnosis not present

## 2023-07-19 DIAGNOSIS — R579 Shock, unspecified: Secondary | ICD-10-CM | POA: Diagnosis not present

## 2023-07-19 HISTORY — PX: IR US GUIDE VASC ACCESS LEFT: IMG2389

## 2023-07-19 HISTORY — PX: IR FLUORO GUIDE CV LINE LEFT: IMG2282

## 2023-07-19 LAB — COOXEMETRY PANEL
Carboxyhemoglobin: 3.3 % — ABNORMAL HIGH (ref 0.5–1.5)
Methemoglobin: <0.7 % (ref 0.0–1.5)
O2 Saturation: 73.4 %
Total hemoglobin: 8 g/dL — ABNORMAL LOW (ref 12.0–16.0)

## 2023-07-19 LAB — RENAL FUNCTION PANEL
Albumin: 2.2 g/dL — ABNORMAL LOW (ref 3.5–5.0)
Albumin: 2.2 g/dL — ABNORMAL LOW (ref 3.5–5.0)
Anion gap: 6 (ref 5–15)
Anion gap: 5 (ref 5–15)
BUN: 23 mg/dL — ABNORMAL HIGH (ref 6–20)
BUN: 19 mg/dL (ref 6–20)
CO2: 24 mmol/L (ref 22–32)
CO2: 27 mmol/L (ref 22–32)
Calcium: 11.8 mg/dL — ABNORMAL HIGH (ref 8.9–10.3)
Calcium: 12.4 mg/dL — ABNORMAL HIGH (ref 8.9–10.3)
Chloride: 101 mmol/L (ref 98–111)
Chloride: 101 mmol/L (ref 98–111)
Creatinine, Ser: 1.74 mg/dL — ABNORMAL HIGH (ref 0.61–1.24)
Creatinine, Ser: 1.76 mg/dL — ABNORMAL HIGH (ref 0.61–1.24)
GFR, Estimated: 47 mL/min — ABNORMAL LOW (ref >60)
GFR, Estimated: 47 mL/min — ABNORMAL LOW (ref >60)
Glucose, Bld: 75 mg/dL (ref 70–99)
Glucose, Bld: 93 mg/dL (ref 70–99)
Phosphorus: 2.4 mg/dL — ABNORMAL LOW (ref 2.5–4.6)
Phosphorus: 2.8 mg/dL (ref 2.5–4.6)
Potassium: 4.6 mmol/L (ref 3.5–5.1)
Potassium: 4.4 mmol/L (ref 3.5–5.1)
Sodium: 131 mmol/L — ABNORMAL LOW (ref 135–145)
Sodium: 133 mmol/L — ABNORMAL LOW (ref 135–145)

## 2023-07-19 LAB — CBC
HCT: 26.7 % — ABNORMAL LOW (ref 39.0–52.0)
Hemoglobin: 7.8 g/dL — ABNORMAL LOW (ref 13.0–17.0)
MCH: 32.6 pg (ref 26.0–34.0)
MCHC: 29.2 g/dL — ABNORMAL LOW (ref 30.0–36.0)
MCV: 111.7 fL — ABNORMAL HIGH (ref 80.0–100.0)
Platelets: 229 10*3/uL (ref 150–400)
RBC: 2.39 MIL/uL — ABNORMAL LOW (ref 4.22–5.81)
RDW: 19.6 % — ABNORMAL HIGH (ref 11.5–15.5)
WBC: 5.9 10*3/uL (ref 4.0–10.5)
nRBC: 0.3 % — ABNORMAL HIGH (ref 0.0–0.2)

## 2023-07-19 LAB — GLUCOSE, CAPILLARY
Glucose-Capillary: 70 mg/dL (ref 70–99)
Glucose-Capillary: 107 mg/dL — ABNORMAL HIGH (ref 70–99)
Glucose-Capillary: 72 mg/dL (ref 70–99)
Glucose-Capillary: 78 mg/dL (ref 70–99)
Glucose-Capillary: 89 mg/dL (ref 70–99)
Glucose-Capillary: 99 mg/dL (ref 70–99)
Glucose-Capillary: 91 mg/dL (ref 70–99)
Glucose-Capillary: 83 mg/dL (ref 70–99)
Glucose-Capillary: 68 mg/dL — ABNORMAL LOW (ref 70–99)
Glucose-Capillary: 124 mg/dL — ABNORMAL HIGH (ref 70–99)
Glucose-Capillary: 98 mg/dL (ref 70–99)

## 2023-07-19 LAB — MAGNESIUM: Magnesium: 2.5 mg/dL — ABNORMAL HIGH (ref 1.7–2.4)

## 2023-07-19 MED ORDER — DEXTROSE 50 % IV SOLN: 12.5000 g | Freq: Once | INTRAVENOUS | Status: AC

## 2023-07-19 MED ORDER — DEXTROSE 50 % IV SOLN
1.0000 | Freq: Once | INTRAVENOUS | Status: AC
  Administered 2023-07-19: 50 mL via INTRAVENOUS
  Filled 2023-07-19: qty 50

## 2023-07-19 MED ORDER — DEXTROSE 10 % IV SOLN: INTRAVENOUS | Status: AC

## 2023-07-19 MED ORDER — DEXTROSE 50 % IV SOLN: 12.5000 g | INTRAVENOUS | Status: AC

## 2023-07-19 MED ORDER — FENTANYL CITRATE (PF) 100 MCG/2ML IJ SOLN
INTRAMUSCULAR | Status: AC
  Filled 2023-07-19: qty 2

## 2023-07-19 MED ORDER — MIDAZOLAM HCL 2 MG/2ML IJ SOLN
INTRAMUSCULAR | Status: AC | PRN
Start: 2023-07-19 — End: 2023-07-19
  Administered 2023-07-19: .5 mg via INTRAVENOUS

## 2023-07-19 MED ORDER — DEXTROSE 50 % IV SOLN
INTRAVENOUS | Status: AC
  Administered 2023-07-19: 50 mL
  Filled 2023-07-19: qty 50

## 2023-07-19 MED ORDER — CHLORHEXIDINE GLUCONATE 4 % EX SOLN
CUTANEOUS | Status: AC
  Filled 2023-07-19: qty 15

## 2023-07-19 MED ORDER — MIDAZOLAM HCL 2 MG/2ML IJ SOLN
INTRAMUSCULAR | Status: AC
  Filled 2023-07-19: qty 2

## 2023-07-19 MED ORDER — HEPARIN SOD (PORK) LOCK FLUSH 10 UNIT/ML IV SOLN
10.0000 [IU] | Freq: Once | INTRAVENOUS | Status: DC
  Filled 2023-07-19: qty 1

## 2023-07-19 MED ORDER — DEXTROSE 50 % IV SOLN
INTRAVENOUS | Status: AC
  Administered 2023-07-19: 50 mL via INTRAVENOUS
  Filled 2023-07-19: qty 50

## 2023-07-19 MED ORDER — FENTANYL CITRATE (PF) 100 MCG/2ML IJ SOLN
INTRAMUSCULAR | Status: AC | PRN
Start: 2023-07-19 — End: 2023-07-19
  Administered 2023-07-19: 25 ug via INTRAVENOUS

## 2023-07-19 MED ORDER — DEXTROSE 50 % IV SOLN
INTRAVENOUS | Status: AC
  Administered 2023-07-19: 12.5 g via INTRAVENOUS
  Filled 2023-07-19: qty 50

## 2023-07-19 MED ORDER — CEFAZOLIN SODIUM-DEXTROSE 2-4 GM/100ML-% IV SOLN
INTRAVENOUS | Status: AC
  Filled 2023-07-19: qty 100

## 2023-07-19 MED ORDER — K PHOS MONO-SOD PHOS DI & MONO 155-852-130 MG PO TABS
250.0000 mg | ORAL_TABLET | Freq: Once | ORAL | Status: AC
  Administered 2023-07-19: 250 mg
  Filled 2023-07-19: qty 1

## 2023-07-19 MED ORDER — DEXTROSE 50 % IV SOLN: 1.0000 | Freq: Once | INTRAVENOUS | Status: AC

## 2023-07-19 MED ORDER — LIDOCAINE-EPINEPHRINE 1 %-1:100000 IJ SOLN
INTRAMUSCULAR | Status: AC
  Filled 2023-07-19: qty 1

## 2023-07-19 MED ORDER — HEPARIN SODIUM (PORCINE) 1000 UNIT/ML IJ SOLN
INTRAMUSCULAR | Status: AC
  Filled 2023-07-19: qty 10

## 2023-07-19 MED ORDER — LIDOCAINE-EPINEPHRINE 0.5 %-1:200000 IJ SOLN
20.0000 mL | Freq: Once | INTRAMUSCULAR | Status: DC
  Filled 2023-07-19: qty 50

## 2023-07-19 MED ORDER — ORAL CARE MOUTH RINSE: 15.0000 mL | OROMUCOSAL | Status: DC | PRN

## 2023-07-19 MED ORDER — OXIDIZED CELLULOSE EX PADS
1.0000 | MEDICATED_PAD | Freq: Once | CUTANEOUS | Status: AC
  Administered 2023-07-20: 1 via TOPICAL
  Filled 2023-07-19: qty 1

## 2023-07-19 MED ORDER — ORAL CARE MOUTH RINSE
15.0000 mL | OROMUCOSAL | Status: DC
  Administered 2023-07-19 – 2023-07-22 (×12): 15 mL via OROMUCOSAL

## 2023-07-19 NOTE — Progress Notes (Signed)
eLink Physician-Brief Progress Note Patient Name: Brenen [name] DOB: Oct 13, 1971 MRN: [id number]   Date of Service  07/19/2023  HPI/Events of Note  Blood sugar 70 mg / dl, patient is NPO.  eICU Interventions  D 10 % gtt at 30 ml / hour x 12 hours ordered.        Thomasene Lot [name] 07/19/2023, 4:17 AM

## 2023-07-19 NOTE — Progress Notes (Signed)
Hypoglycemic Event  CBG: 70  Treatment: D50 25 mL (12.5 gm)  Symptoms: Sweaty, Shaky, Nervous/irritable, and repeatedly states "I don't feel good"   Follow-up CBG: Time:0436 CBG Result:107  Possible Reasons for Event: Other: NPO  Comments/MD notified:yes    Delorse Lek

## 2023-07-19 NOTE — Progress Notes (Signed)
[address]       [place] [address]             [phone]      5 Days Post-Op  Procedure(s) (LRB): LIGATION OF RIGHT ARTERIOVENOUS  FISTULA (Right)   Total Length of Stay:  LOS: 27 days    SUBJECTIVE: Awaiting swallow study Vitals:   07/19/23 0600 07/19/23 0700  BP: (!) 103/40 (!) 113/43  Pulse: 82 83  Resp: (!) 25 (!) 24  Temp:    SpO2: 95% 94%    Intake/Output      09/10 0701 09/11 0700 09/11 0701 09/12 0700   I.V. (mL/kg) 367.1 (4.8)    NG/GT 1127.8    Total Intake(mL/kg) 1494.9 (19.4)    Emesis/NG output     Stool 110    CRRT 395.9    Total Output 505.9    Net +989              prismasol BGK 4/2.5 400 mL/hr at 07/19/23 0517    prismasol BGK 4/2.5 400 mL/hr at 07/18/23 2247   sodium chloride 10 mL/hr at 07/19/23 0700   albumin human Stopped (06/23/23 0040)    ceFAZolin (ANCEF) IV     dextrose 30 mL/hr at 07/19/23 0700   epinephrine Stopped (07/18/23 0703)   feeding supplement (PIVOT 1.5 CAL) Stopped (07/18/23 2358)   prismasol BGK 4/2.5 1,800 mL/hr at 07/19/23 0451   vasopressin Stopped (07/18/23 1420)    CBC    Component Value Date/Time   WBC 5.9 07/19/2023 0346   RBC 2.39 (L) 07/19/2023 0346   HGB 7.8 (L) 07/19/2023 0346   HCT 26.7 (L) 07/19/2023 0346   PLT 229 07/19/2023 0346   MCV 111.7 (H) 07/19/2023 0346   MCH 32.6 07/19/2023 0346   MCHC 29.2 (L) 07/19/2023 0346   RDW 19.6 (H) 07/19/2023 0346   LYMPHSABS 1.6 07/18/2023 0423   MONOABS 1.3 (H) 07/18/2023 0423   EOSABS 0.4 07/18/2023 0423   BASOSABS 0.1 07/18/2023 0423   CMP     Component Value Date/Time   NA 131 (L) 07/19/2023 0346   K 4.6 07/19/2023 0346   CL 101 07/19/2023 0346   CO2 24 07/19/2023 0346   GLUCOSE 75 07/19/2023 0346   BUN 23 (H) 07/19/2023 0346   CREATININE 1.74 (H) 07/19/2023 0346   CALCIUM 11.8 (H) 07/19/2023 0346   CALCIUM 9.8 02/18/2011 1007   PROT 8.9 (H) 07/18/2023 0423   ALBUMIN 2.2 (L) 07/19/2023 0346   AST 45 (H)  07/18/2023 0423   ALT 17 07/18/2023 0423   ALKPHOS 117 07/18/2023 0423   BILITOT 0.5 07/18/2023 0423   GFRNONAA 47 (L) 07/19/2023 0346   GFRAA 10 (L) 03/16/2020 0825   ABG    Component Value Date/Time   PHART 7.317 (L) 07/11/2023 1346   PCO2ART 48.2 (H) 07/11/2023 1346   PO2ART 103 07/11/2023 1346   HCO3 27.9 August 07, 2023 1038   HCO3 27.6 08-07-23 1038   TCO2 29 08/07/23 1038   TCO2 29 08/07/23 1038   ACIDBASEDEF 1.0 07/21/2023 1517   O2SAT 73.4 07/19/2023 0353   CBG (last 3)  Recent Labs    07/18/23 2355 07/19/23 0350 07/19/23 0436  GLUCAP 93 70 107*  EXAM Withdrawn affect Lungs: rhonchorous Ext: warm Card: RR   ASSESSMENT: SP AVR MV/TV repair MAZE Hemodynamics ok off all drips and NO Awaiting tunneled catheter and TEMP/PERM wire Swallow study today Need to get PT  DC art line  Eugenio Hoes, MD 07/19/2023

## 2023-07-19 NOTE — Progress Notes (Signed)
NAME:  [name], MRN:  [id number], DOB:  04/06/1971, LOS: 27 ADMISSION DATE:  06/17/2023, CONSULTATION DATE: 06/10/2023 REFERRING MD: Leafy Ro - TCTS, CHIEF COMPLAINT: Postcardiac surgery  History of Present Illness:  This is a 52 year old gentleman, past medical history of anxiety depression, bipolar disease, congestive heart failure chronic diastolic heart failure kidney disease followed by renal transplant in 03-Oct-1998 repeat renal transplant in Oct 03, 2014.  Patient underwent surgery today for severe mitral regurgitation severe tricuspid regurgitation and moderate aortic stenosis.  He also has atrial fibrillation received maze procedure.  He had a complex mitral valve repair a tricuspid valve annuloplasty and aortic valve replacement followed by full right and left maze procedure.  Pertinent Medical History:   Past Medical History:  Diagnosis Date   ADHD (attention deficit hyperactivity disorder)    Anemia    Anemia, chronic disease 07/13/2011   Anxiety    Arthritis    Bipolar 2 disorder (HCC) 08/18/2011   Diagnosed in 10-03-2010.    Bipolar disorder (HCC)    Blood dyscrasia    Nephrotic Anemia   Blood transfusion    Blood transfusion without reported diagnosis    Chronic diastolic CHF (congestive heart failure) (HCC)    Complication of anesthesia    Woke up intubated gets combative  afraid to be alone    Congestive heart failure (CHF) (HCC) 07/13/2011   Onset 10-03-2005.  Followed by Dr. Eden Emms.  S/p cardiac catheterization in [facility] Dr. Anne Fu..  S/p cardiac catheterization in 10/03/2013 by Nishan.  Echo 2013-10-03.    Coronary artery disease    Depression    Dysrhythmia    A. Fib October 04, 2023 with DCCV in May 2024   Erectile dysfunction    ESRD on hemodialysis (HCC) 07/13/2011   Glomerulonephritis at age 31, started HD in 1995/10/04.  Deceased donor renal transplant 10-03-98 at [facility], then kidney failed and went back on HD in 10-03-2005.  L forearm AVF . s/p repeat renal transplant 10/2014 [facility].   GI bleed 11/01/2011   Rectal  bleeding with emesis/diarrhea    Heart murmur    Hemodialysis status (HCC)    HSV (herpes simplex virus) infection    Hydrocele, right 11/25/2014   Hypertension    Hypertensive emergency 01/06/2013   Hypoglycemia 01/11/2013   Influenza-like illness 12/18/2013   Mild ascending aorta dilatation (HCC)    NICM (nonischemic cardiomyopathy) (HCC)    Pericardial effusion    a. Mod by echo in 10-03-13, similar to prior.   Peripheral polyneuropathy 08/04/2016   Pneumonia    Pulmonary edema 01/06/2013   Pulmonary hypertension associated with end stage renal disease on dialysis [facility]) 07/13/2011   Renal transplant, status post 11/25/2014   Pt with Glomerulonephritis at age 89. Deceased donor renal transplant October 03, 1998 right and left renal transplant 10/2014.  Followed by [facility] transplant team; nephrologist is Dr. Lonna Cobb.    Respiratory failure with hypoxia (HCC) 01/06/2013   Shortness of breath 12/12/2012   Sleep apnea    Swollen testicle 07/13/2011   URI (upper respiratory infection) 01/25/2012    Significant Hospital Events: Including procedures, antibiotic start and stop dates in addition to other pertinent events   8/15 - AVR, MVR, TVR with TCTS ([place]). Remains on multiple pressors (NE, Epi, vaso). 8/16 - LIJ Trialysis catheter placed for CRRT.  8/17 extubated  8/18 - remains on CVVHD, 3 pressors  8/19-8/20 improving pressor needs 8/21 agitated delirium; cortrak 8/26 CRRT restarted July 11, 2023 TVP placed, brady overnight and pacing turned to 80 8/30 palli consult  9/1 PRBC for low coox  9/2 coox down a bit again  9/3CRRT off overnight due to access issues. Remains on vaso 0.04, epi 10, norepi 2, DBA 5. 9/4 RHC, started iNO 9/6 repeat RHC, fistula ligated 9/9 Remains on vaso 0.04, epi . 30 midodrine TID, Temp pacer, small effusions on CXR , mild interstitial edema, left lower lobe consolidation most likely atelectasis , afebrile 9/10 intermittent vasopressor. Echo continue to show  severe RV dysfunction.   Interim History / Subjective:  S/p L tunneled HD cath by IR this am  Off pressors and iNO since yest afternoon More awake today  Objective:  Blood pressure (!) 123/44, pulse 82, temperature 98.2 F (36.8 C), temperature source Oral, resp. rate (!) 22, height 6\' 3"  (1.905 m), weight 76.9 kg, SpO2 98%. CVP:  [7 mmHg-18 mmHg] 18 mmHg      Intake/Output Summary (Last 24 hours) at 07/19/2023 1032 Last data filed at 07/19/2023 1610 Gross per 24 hour  Intake 1267.53 ml  Output 541.6 ml  Net 725.93 ml   Filed Weights   07/17/23 0500 07/18/23 0323 07/19/23 0339  Weight: 76.1 kg 75.6 kg 76.9 kg   Physical Examination:  General:  chronically ill cachetic male lying in bed in NAD HEENT: MM pink/moist, pupils 4/r, left cortrak, mild temporal wasting Neuro: opens eyes walking into room, soft phonation but able to answer to person, place, month/year and f/c, severe generalized weakness CV: vpaced via R internal jugular temp pacer, midline sternal incision healing, r femoral aline> being d/c-, R IJ TDC PULM:  non labored, mostly clear, few scattered rhonchi, 4L [place] GI: soft, bs+, NT/ ND, flexiseal Extremities: warm/dry, no LE edema  Skin: no rashes   UF even Afebrile Labs reviewed > phos 2.4 Coox 73 CVP 10-13 Wts 76> 75> 76.9kg   Assessment & Plan:   ICU delirium, acute encephalopathy - improving.  - avoid sedating meds - hopeful to start mobilizing PT/ OT once temp/ PPM placed, aline out today  - delirium precautions  Acute respiratory failure with hypoxia & hypercapnia - remains off iNO today. - stable 4L/ supplemental O2 to maintain SpO2 >90% - ongoing pulm hygiene- IS, pending mobolization  MVR, TVR, AS s/p bioprosthetic replacement CHB/ post op junctional rhythm s/p TVP (8/27) Cardiogenic shock, RV failure Stable pericardial effusion w/o tamponade AoC HFrEF> currently EF recovered Afib s/p MAZE  - Continue midodrine 30mg  TID - remains off  pressors.  Goal SBP>90.  Femoral aline out today - pending temp/ perm PPM plans per EP now [facility] placed  ESRD; CKD s/p failed renal transplants x 2 (1999, 2015)  - s/p TDC by IR 9/11 - CRRT per Nephrology   H/o HSV - con't acyclovir, PTA med  Anemia - expected ABLA (resolved), critical illness, chronic dz  - stable CBC, trend  - transfuse for Hb <7 or hemodynamically significant bleeding - aranesp per nephro   Severe protein energy malnutrition  - con't TF per cortrak - pending SLP today  Possible gout flare - completed 5 days colchicine 9/8  Sore throat - magic mouthwash prn  Physical deconditioning  - pending aggressive PT, OT, SLP; CRRT/ temp pacer/ femoral aline has limited this  - anticipate CIR eventually   GOC -PMT consult 8/30. Family had been updated 9/9. Full aggressive care desired.     Best Practice (right click and "Reselect all SmartList Selections" daily)   Diet/type:TF  DVT prophylaxis: heparin sq GI prophylaxis: PPI Lines: Central line and Dialysis Catheter Foley:  No  Code Status:  full code Last date of multidisciplinary goals of care discussion [Per Primary Team]  CCT:  30 mins    Posey Boyer, MSN, AG-ACNP-BC [facility] 07/19/2023, 10:32 AM  See Amion for pager If no response to pager , please call [phone] until 7pm After 7:00 pm call Elink  [phone]

## 2023-07-19 NOTE — Progress Notes (Signed)
SLP Cancellation Note  Patient Details Name: Ravinder Lamkin MRN: [id number] DOB: 1971-07-11   Cancelled treatment:       Reason Eval/Treat Not Completed: Patient at procedure or test/unavailable;Other (comment). Planned for tunneled cath today and femoral line to be removed as well. SLP will follow for FEES readiness.  Angela Nevin, MA, CCC-SLP Speech Therapy

## 2023-07-19 NOTE — Plan of Care (Signed)
  Problem: Education: Goal: Knowledge of General Education information will improve Description: Including pain rating scale, medication(s)/side effects and non-pharmacologic comfort measures Outcome: Progressing   Problem: Health Behavior/Discharge Planning: Goal: Ability to manage health-related needs will improve Outcome: Progressing   Problem: Clinical Measurements: Goal: Ability to maintain clinical measurements within normal limits will improve Outcome: Progressing Goal: Will remain free from infection Outcome: Progressing Goal: Diagnostic test results will improve Outcome: Progressing Goal: Respiratory complications will improve Outcome: Progressing Goal: Cardiovascular complication will be avoided Outcome: Progressing   Problem: Activity: Goal: Risk for activity intolerance will decrease Outcome: Progressing   Problem: Nutrition: Goal: Adequate nutrition will be maintained Outcome: Progressing   Problem: Coping: Goal: Level of anxiety will decrease Outcome: Progressing   Problem: Elimination: Goal: Will not experience complications related to bowel motility Outcome: Progressing   Problem: Pain Managment: Goal: General experience of comfort will improve Outcome: Progressing   Problem: Safety: Goal: Ability to remain free from injury will improve Outcome: Progressing   Problem: Skin Integrity: Goal: Risk for impaired skin integrity will decrease Outcome: Progressing   Problem: Education: Goal: Knowledge of disease or condition will improve Outcome: Progressing Goal: Knowledge of the prescribed therapeutic regimen will improve Outcome: Progressing   Problem: Activity: Goal: Risk for activity intolerance will decrease Outcome: Progressing   Problem: Cardiac: Goal: Will achieve and/or maintain hemodynamic stability Outcome: Progressing   Problem: Clinical Measurements: Goal: Postoperative complications will be avoided or minimized Outcome:  Progressing   Problem: Respiratory: Goal: Respiratory status will improve Outcome: Progressing   Problem: Skin Integrity: Goal: Wound healing without signs and symptoms of infection Outcome: Progressing Goal: Risk for impaired skin integrity will decrease Outcome: Progressing   Problem: Urinary Elimination: Goal: Ability to achieve and maintain adequate renal perfusion and functioning will improve Outcome: Progressing   Problem: Education: Goal: Understanding of CV disease, CV risk reduction, and recovery process will improve Outcome: Progressing Goal: Individualized Educational Video(s) Outcome: Progressing   Problem: Activity: Goal: Ability to return to baseline activity level will improve Outcome: Progressing   Problem: Cardiovascular: Goal: Ability to achieve and maintain adequate cardiovascular perfusion will improve Outcome: Progressing Goal: Vascular access site(s) Level 0-1 will be maintained Outcome: Progressing   Problem: Health Behavior/Discharge Planning: Goal: Ability to safely manage health-related needs after discharge will improve Outcome: Progressing   Problem: Education: Goal: Ability to describe self-care measures that may prevent or decrease complications (Diabetes Survival Skills Education) will improve Outcome: Progressing Goal: Individualized Educational Video(s) Outcome: Progressing   Problem: Coping: Goal: Ability to adjust to condition or change in health will improve Outcome: Progressing   Problem: Fluid Volume: Goal: Ability to maintain a balanced intake and output will improve Outcome: Progressing   Problem: Health Behavior/Discharge Planning: Goal: Ability to identify and utilize available resources and services will improve Outcome: Progressing Goal: Ability to manage health-related needs will improve Outcome: Progressing   Problem: Metabolic: Goal: Ability to maintain appropriate glucose levels will improve Outcome:  Progressing   Problem: Nutritional: Goal: Maintenance of adequate nutrition will improve Outcome: Progressing Goal: Progress toward achieving an optimal weight will improve Outcome: Progressing   Problem: Skin Integrity: Goal: Risk for impaired skin integrity will decrease Outcome: Progressing   Problem: Tissue Perfusion: Goal: Adequacy of tissue perfusion will improve Outcome: Progressing

## 2023-07-19 NOTE — Procedures (Signed)
Central Venous Catheter Insertion Procedure Note  [name]  [id number]  [birth year]  Date:07/19/23  Time:2:21 PM   Provider Performing:Brooke Lodema Hong   Procedure: Insertion of Non-tunneled Central Venous [phone]) with US guidance ([id number])   Indication(s) Medication administration and Difficult access  Consent Risks of the procedure as well as the alternatives and risks of each were explained to the patient and/or caregiver.  Consent for the procedure was obtained and is signed in the bedside chart  Anesthesia Topical only with 1% lidocaine   Timeout Verified patient identification, verified procedure, site/side was marked, verified correct patient position, special equipment/implants available, medications/allergies/relevant history reviewed, required imaging and test results available.  Sterile Technique Maximal sterile technique including full sterile barrier drape, hand hygiene, sterile gown, sterile gloves, mask, hair covering, sterile ultrasound probe cover (if used).  Procedure Description Area of catheter insertion was cleaned with chlorhexidine and draped in sterile fashion.  With real-time ultrasound guidance a central venous catheter was placed into the left femoral vein. Nonpulsatile blood flow and easy flushing noted in all ports.  The catheter was sutured in place, biopatch and sterile dressing applied.    Complications/Tolerance None; patient tolerated the procedure well. Chest x-ray is not ordered for femoral cannulation.  EBL none  Specimen(s) None     Posey Boyer, MSN, AG-ACNP-BC [facility] 07/19/2023, 2:21 PM  See Amion for pager If no response to pager , please call [phone] until 7pm After 7:00 pm call Elink  [phone]

## 2023-07-19 NOTE — Progress Notes (Signed)
[facility] Progress Note  Subjective:  Seen in room S/p L internal jugular TDC this AM with IR.  Off epi, On VP UF based upon CVP 0 to 8mL/h neg All 4K bath Not clotting K 4.6, P 2.4 Seen on CRRT. Treatment reviewed.  Discussed with RN  Vitals:   07/19/23 0910 07/19/23 0915 07/19/23 0920 07/19/23 0925  BP: (!) 105/51 (!) 103/48 (!) 109/51 (!) 123/44  Pulse: 82 82 82 82  Resp: (!) 21 (!) 25 (!) 22 (!) 22  Temp:      TempSrc:      SpO2: 95% 100% 95% 98%  Weight:      Height:        Physical Exam:       General adult male in bed on [place] O2 Lungs clear but reduced to auscultation bilaterally  Heart S1S2 Abdomen soft nontender nondistended Extremities no edema appreciated Neuro - resting, somnolent, awakens easily Access left internal jugular tunneled dialysis catheter    OP HD: [place] triad MWF 3rd shift 3h    F250     94kg   2K/2.5 Ca bath   RUE AVF    Summary: [name] is a/an 52 y.o. 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 Underwent aortic biovalve replacement, TV repair, MV repari, bilat Maze procedures.    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 SP AVF ligation 9/06 by VVS. Weaning epi gtt down.   # ESRD - pt was too hypotensive for hemodialysis post-op.  CRRT started 8/16 and remains on CRRT. Now off pressors and more stabler. I think ready for transition to iHD.  Will d/w AHF, TCTS and CCM, then likely stop today.    # HD access - AVF ligated for CHF. Now s/p [facility] 07/19/23 with IR>  Working well so far   # Distributive shock - Of fpressors, remains on high dose midodrine.  As above  # pHTN - severe by RHC --> Weening off  inh NO2  # Volume - improved, wt's stable. Targeting CVP 12-13 due to severe RV failure per CHF team. Keeping even again today per CHF team.   # Pericardial effusion - per CT surgery and CHF.  We are optimizing volume status with CRRT as above.  Assessed as moderate on last TTE   # Anemia of ESRD - transfuse as needed per primary team. Aranesp increased to 150 mcg weekly on Tuesdays    # MBD ckd -  Phos acceptable.  No binders currently as on CRRT - normally on renvela.    # Hypercalcemia - remains mild- moderate. Suspect due to immobility. Usually mild-mod hypercalcemia does not require treatment. Will follow.    [name], MD  07/19/2023, 11:06 AM  Recent Labs  Lab 07/18/23 0423 07/18/23 1840 07/19/23 0346  HGB 7.7*  --  7.8*  ALBUMIN 2.3* 2.3* 2.2*  CALCIUM 11.7* 12.2* 11.8*  PHOS  --  3.0 2.4*  CREATININE 1.61* 1.67* 1.74*  K 4.8 4.6 4.6    Inpatient medications:  aspirin  81 mg Per Tube Daily   atorvastatin  10 mg Per Tube QHS  azelastine  2 spray Each Nare BID   Chlorhexidine Gluconate Cloth  6 each Topical Daily   cinacalcet  30 mg Oral Q supper   darbepoetin (ARANESP) injection - DIALYSIS  150 mcg Subcutaneous Q Tue-1800   escitalopram  10 mg Per Tube Daily   folic acid  1 mg Per Tube Daily   Gerhardt's butt cream   Topical Daily   guaiFENesin  15 mL Per Tube BID   heparin flush  10 Units Intracatheter Once   heparin injection (subcutaneous)  5,000 Units Subcutaneous Q8H   [START ON 07/20/2023] influenza vac split trivalent PF  0.5 mL Intramuscular Once   insulin aspart  0-9 Units Subcutaneous Q4H   lidocaine-EPINEPHrine  20 mL Other Once   midodrine  30 mg Per Tube TID   multivitamin  1 tablet Per Tube BID   mouth rinse  15 mL Mouth Rinse 4 times per day   mouth rinse  15 mL Mouth Rinse 4 times per day   pantoprazole (PROTONIX) IV  40 mg Intravenous Q12H   phosphorus  250 mg Per Tube Once   sodium chloride flush  3 mL Intravenous Q12H   thiamine  100 mg  Oral Daily     prismasol BGK 4/2.5 400 mL/hr at 07/19/23 0517    prismasol BGK 4/2.5 400 mL/hr at 07/18/23 2247   sodium chloride 10 mL/hr at 07/19/23 0800   albumin human Stopped (06/23/23 0040)   dextrose 30 mL/hr at 07/19/23 0800   epinephrine Stopped (07/18/23 0703)   feeding supplement (PIVOT 1.5 CAL) Stopped (07/18/23 2358)   prismasol BGK 4/2.5 1,800 mL/hr at 07/19/23 0740   vasopressin Stopped (07/18/23 1420)   sodium chloride, acetaminophen, albumin human, bisacodyl **OR** bisacodyl, clonazepam, docusate, heparin, ondansetron (ZOFRAN) IV, mouth rinse, mouth rinse, oxyCODONE, phenol, polyethylene glycol, simethicone, sodium chloride flush

## 2023-07-19 NOTE — Progress Notes (Signed)
PT Cancellation Note  Patient Details Name: [name] MRN: [id number] DOB: 1971-08-14   Cancelled Treatment:    Reason Eval/Treat Not Completed: Patient at procedure or test/unavailable. Pt leaving for IR procedure. Plan is for femoral line to be removed as well today. Acute PT to return as able to progress mobility.  [name], PT, DPT Acute Rehabilitation Services Secure chat preferred Office #: [phone]    [name] 07/19/2023, 7:58 AM

## 2023-07-19 NOTE — Progress Notes (Signed)
Patient ID: [name], male   DOB: 11/16/1970, 52 y.o.   MRN: [id number]  TCTS Evening Rounds:  Hemodynamically stable off pressors.   Remains on CRRT  BMET    Component Value Date/Time   NA 133 (L) 07/19/2023 1656   K 4.4 07/19/2023 1656   CL 101 07/19/2023 1656   CO2 27 07/19/2023 1656   GLUCOSE 93 07/19/2023 1656   BUN 19 07/19/2023 1656   CREATININE 1.76 (H) 07/19/2023 1656   CALCIUM 12.4 (H) 07/19/2023 1656   CALCIUM 9.8 02/18/2011 1007   GFRNONAA 47 (L) 07/19/2023 1656

## 2023-07-19 NOTE — Progress Notes (Addendum)
Patient Name: [name] Date of Encounter: 07/19/2023  Primary Cardiologist: Reatha Harps, MD Electrophysiologist: Lanier Prude, MD  Interval Summary   For tunneled cath today.   Off pressors and iNO since yesterday afternoon.   Opens eyes to voice, does not vocalize.   Vital Signs    Vitals:   07/19/23 0530 07/19/23 0545 07/19/23 0600 07/19/23 0700  BP:   (!) 103/40 (!) 113/43  Pulse: 82 82 82 83  Resp: (!) 23 (!) 23 (!) 25 (!) 24  Temp:      TempSrc:      SpO2: 95% 95% 95% 94%  Weight:      Height:        Intake/Output Summary (Last 24 hours) at 07/19/2023 0807 Last data filed at 07/19/2023 0700 Gross per 24 hour  Intake 1402.75 ml  Output 506.2 ml  Net 896.55 ml   Filed Weights   07/17/23 0500 07/18/23 0323 07/19/23 0339  Weight: 76.1 kg 75.6 kg 76.9 kg    Physical Exam    GEN- The patient is chronically ill, lethargic, and somewhat cachectic.  Opens eyes to voice but does not participate well with conversation. Lungs- Diminished throughout.  Cardiac- Regular rate and rhythm GI- soft, NT, ND, + BS Extremities- Trace to 1+ BLE edema.   Telemetry    V pacing at 82 bpm, no underlying when threshold tested (personally reviewed)  Hospital Course    52 y/o AAM w/ mildly reduced systolic heart failure (EF once 45-50% in 2013 but normal on subsequent echos) valvular heart disease including severe AS, 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 EF 55-60% w/ mild RV dysfunction by echo in 6/24.    Admitted for elective triple vale surgery + MAZE. S/p bioprosthetic aortic valve replacement + mitral and tricuspid valve repairs. Post-op course c/b persistent vasoplagic syndrome.   Assessment & Plan    CHB S/p AVR/TVr/MVr/MAZE/LAAC Epicardial wires with intermittent capture and worsening threshold Now s/p RIJ temp  wire.  Has intermittently had escape in the 50s, none today.  Threshold remains stable @ <1.0. His options remain poor.  He is very poor candidate for permanent transvenous pacing.  He would potentially be a candidate for Leadless pacing, but at this time is not a candidate for general sedation with ongoing iNO.   Potentially could consider a temporary-permanent system as a bridge to his recovery and candidacy for more in depth procedures.   Dr. Ladona Ridgel to see today   ESRD Per primary team On CVVHD day, intermittently pulling vs keeping even.  For tunneled cath today. Hoping to free up space and lines to get pt up and moving.    Acute on Chronic Systolic Heart Failure w/ Prominent RV Dysfunction  Post-op Vasopalegia/high-output HF Echo 08/29: Moderate pericardial effusion, no tamponade RHC c/w high output HF physiology 2/2 AV fistula despite severe RV dysfunction  s/p AVF ligation on 9/6 Off pressors and trying to get all extra lines out.  Off iNO.  Remains on Midodrine 30 mg TID.   For questions or updates, please contact [facility] Please consult [email or web address] for contact info under Cardiology/STEMI.  Signed, Graciella Freer, PA-C  07/19/2023, 8:07 AM   EP Attending  Patient seen and examined. Agree with above. The patient has just returned from insertion of a tunneled HD access catheter. He has CHB and is  PM dependent. He is disoriented. He has a central line in the right scv and a temp PM in the right internal jugular. He has a recently removed HD catheter from the left internal jugular and a new tunneled HD catheter in the left SCV. Tele reveals NSR with CHB.   A/P CHB - his conduction has not improved. I have recommended a temp perm. His access is an issue and I am reluctant to place a temp perm with a recently removed HD access site in the left internal jugular and a still in place left SCV central line.  I would suggest that the right sided central line be moved  either to the left internal jugular or right femoral, followed by insertion of a right SCV temp/perm TV PPM system to see if he is going to be viable from a CHF perspective. If so, then a medtronic MICRA or [employer] leadless would be a consideration. Prognosis is guarded. ESRD - he will continue HD thru the newly placed HD catheter.   Sharlot Gowda Paylin Hailu,MD

## 2023-07-19 NOTE — Procedures (Signed)
Pre-procedure Diagnosis: ESRD, in need of durable access of continued HD. Post-procedure Diagnosis: Same  Successful placement of left internal jugular approach tunneled HD catheter with tips terminating within the superior aspect of the right atrium.   Successful removal of left internal jugular approach temporary HD catheter.  (Note, decision to place a left sided approach HD catheter was approved by the cardiology team PRIOR to the procedure as they are planning on proceeding with pacemaker implantation in the near future.)  Complications: None Immediate EBL: Trace  The catheter is ready for immediate use.   Katherina Right, MD Pager #: [phone]

## 2023-07-19 NOTE — Progress Notes (Signed)
OT Cancellation Note  Patient Details Name: [name] MRN: [id number] DOB: 1971-01-04   Cancelled Treatment:    Reason Eval/Treat Not Completed: Patient at procedure or test/ unavailable (Pt leaving for IR procedure. Plan is for femoral line to be removed as well today. Will follow up for OT treatment as schedule permits)  Carver Fila, OTD, OTR/L SecureChat Preferred Acute Rehab [phone]   Dalphine Handing 07/19/2023, 8:28 AM

## 2023-07-20 ENCOUNTER — Inpatient Hospital Stay (HOSPITAL_COMMUNITY): Payer: Medicare HMO

## 2023-07-20 DIAGNOSIS — I5023 Acute on chronic systolic (congestive) heart failure: Secondary | ICD-10-CM | POA: Diagnosis not present

## 2023-07-20 DIAGNOSIS — E43 Unspecified severe protein-calorie malnutrition: Secondary | ICD-10-CM | POA: Diagnosis not present

## 2023-07-20 DIAGNOSIS — Z992 Dependence on renal dialysis: Secondary | ICD-10-CM

## 2023-07-20 DIAGNOSIS — I3139 Other pericardial effusion (noninflammatory): Secondary | ICD-10-CM | POA: Diagnosis not present

## 2023-07-20 DIAGNOSIS — Z9889 Other specified postprocedural states: Secondary | ICD-10-CM | POA: Diagnosis not present

## 2023-07-20 DIAGNOSIS — R5381 Other malaise: Secondary | ICD-10-CM | POA: Diagnosis not present

## 2023-07-20 DIAGNOSIS — N186 End stage renal disease: Secondary | ICD-10-CM | POA: Diagnosis not present

## 2023-07-20 DIAGNOSIS — L8990 Pressure ulcer of unspecified site, unspecified stage: Secondary | ICD-10-CM | POA: Insufficient documentation

## 2023-07-20 DIAGNOSIS — I50810 Right heart failure, unspecified: Secondary | ICD-10-CM | POA: Diagnosis not present

## 2023-07-20 LAB — COOXEMETRY PANEL
Carboxyhemoglobin: 2.6 % — ABNORMAL HIGH (ref 0.5–1.5)
Carboxyhemoglobin: 1.4 % (ref 0.5–1.5)
Carboxyhemoglobin: 2.6 % — ABNORMAL HIGH (ref 0.5–1.5)
Methemoglobin: 0.8 % (ref 0.0–1.5)
Methemoglobin: <0.7 % (ref 0.0–1.5)
Methemoglobin: 1.3 % (ref 0.0–1.5)
O2 Saturation: 62.8 %
O2 Saturation: 58.1 %
O2 Saturation: 67.2 %
Total hemoglobin: 8.4 g/dL — ABNORMAL LOW (ref 12.0–16.0)
Total hemoglobin: 8 g/dL — ABNORMAL LOW (ref 12.0–16.0)
Total hemoglobin: 8.1 g/dL — ABNORMAL LOW (ref 12.0–16.0)

## 2023-07-20 LAB — GLUCOSE, CAPILLARY
Glucose-Capillary: 100 mg/dL — ABNORMAL HIGH (ref 70–99)
Glucose-Capillary: 102 mg/dL — ABNORMAL HIGH (ref 70–99)
Glucose-Capillary: 106 mg/dL — ABNORMAL HIGH (ref 70–99)
Glucose-Capillary: 109 mg/dL — ABNORMAL HIGH (ref 70–99)
Glucose-Capillary: 69 mg/dL — ABNORMAL LOW (ref 70–99)
Glucose-Capillary: 76 mg/dL (ref 70–99)
Glucose-Capillary: 81 mg/dL (ref 70–99)
Glucose-Capillary: 84 mg/dL (ref 70–99)
Glucose-Capillary: 93 mg/dL (ref 70–99)

## 2023-07-20 LAB — RENAL FUNCTION PANEL
Albumin: 2.2 g/dL — ABNORMAL LOW (ref 3.5–5.0)
Albumin: 2.2 g/dL — ABNORMAL LOW (ref 3.5–5.0)
Anion gap: 3 — ABNORMAL LOW (ref 5–15)
Anion gap: 10 (ref 5–15)
BUN: 27 mg/dL — ABNORMAL HIGH (ref 6–20)
BUN: 44 mg/dL — ABNORMAL HIGH (ref 6–20)
CO2: 26 mmol/L (ref 22–32)
CO2: 22 mmol/L (ref 22–32)
Calcium: 12.9 mg/dL — ABNORMAL HIGH (ref 8.9–10.3)
Calcium: 14.1 mg/dL (ref 8.9–10.3)
Chloride: 101 mmol/L (ref 98–111)
Chloride: 98 mmol/L (ref 98–111)
Creatinine, Ser: 2.38 mg/dL — ABNORMAL HIGH (ref 0.61–1.24)
Creatinine, Ser: 3.26 mg/dL — ABNORMAL HIGH (ref 0.61–1.24)
GFR, Estimated: 32 mL/min — ABNORMAL LOW (ref >60)
GFR, Estimated: 22 mL/min — ABNORMAL LOW (ref >60)
Glucose, Bld: 105 mg/dL — ABNORMAL HIGH (ref 70–99)
Glucose, Bld: 101 mg/dL — ABNORMAL HIGH (ref 70–99)
Phosphorus: 3.1 mg/dL (ref 2.5–4.6)
Phosphorus: 3.6 mg/dL (ref 2.5–4.6)
Potassium: 4.6 mmol/L (ref 3.5–5.1)
Potassium: 5.2 mmol/L — ABNORMAL HIGH (ref 3.5–5.1)
Sodium: 130 mmol/L — ABNORMAL LOW (ref 135–145)
Sodium: 130 mmol/L — ABNORMAL LOW (ref 135–145)

## 2023-07-20 LAB — HEPATITIS B SURFACE ANTIGEN: Hepatitis B Surface Ag: NONREACTIVE

## 2023-07-20 LAB — MAGNESIUM: Magnesium: 2.6 mg/dL — ABNORMAL HIGH (ref 1.7–2.4)

## 2023-07-20 MED ORDER — HEPARIN SODIUM (PORCINE) 5000 UNIT/ML IJ SOLN
5000.0000 [IU] | Freq: Two times a day (BID) | INTRAMUSCULAR | Status: DC
  Administered 2023-07-20: 5000 [IU] via SUBCUTANEOUS
  Filled 2023-07-20: qty 1

## 2023-07-20 MED ORDER — DEXTROSE 50 % IV SOLN: 25.0000 mL | INTRAVENOUS | Status: DC | PRN

## 2023-07-20 MED ORDER — CLONAZEPAM 0.25 MG PO TBDP
0.2500 mg | ORAL_TABLET | Freq: Two times a day (BID) | ORAL | Status: DC | PRN
  Administered 2023-07-20 – 2023-07-25 (×6): 0.25 mg
  Filled 2023-07-20 (×5): qty 1

## 2023-07-20 MED ORDER — CALCITONIN (SALMON) 200 UNIT/ML IJ SOLN
4.0000 [IU]/kg | Freq: Two times a day (BID) | INTRAMUSCULAR | Status: AC
  Administered 2023-07-21 (×2): 316 [IU] via INTRAMUSCULAR
  Filled 2023-07-20 (×4): qty 1.58

## 2023-07-20 MED ORDER — SODIUM CHLORIDE 0.9 % IV SOLN
60.0000 mg | Freq: Once | INTRAVENOUS | Status: AC
  Administered 2023-07-20: 60 mg via INTRAVENOUS
  Filled 2023-07-20: qty 20

## 2023-07-20 MED ORDER — DEXTROSE 50 % IV SOLN
INTRAVENOUS | Status: AC
  Administered 2023-07-20: 25 mL via INTRAVENOUS
  Filled 2023-07-20: qty 50

## 2023-07-20 NOTE — Progress Notes (Signed)
Nutrition Follow-up  DOCUMENTATION CODES:   Severe malnutrition in context of chronic illness  INTERVENTION:   Recommend initiation of supplemental TPN given persistent interruptions in TF for several weeks due to N/V and pt was admitted with severe malnutrition and does not appear to have improved much nutritionally since admission, current weight is well below outpatient EDW, significant muscle wasting and subcutaneous fat loss on exam; newly documented pressure injury. Pt is extremely weak and deconditioned.   TF currently on hold post emesis x 2 but order remains active. Defer to MD regarding resuming TF today  If pt able to tolerate TF, recommend PEG placement due to chronicity of his malnutrition, even when able to take po (currently NPO and failed FEES today). Pt would likely benefit greatly from nutrition support post discharge (at least initially) even if able to advance diet.   NUTRITION DIAGNOSIS:   Severe Malnutrition related to chronic illness as evidenced by severe muscle depletion, severe fat depletion.  Continues  GOAL:   Patient will meet greater than or equal to 90% of their needs  NOT MET  MONITOR:   PO intake, TF tolerance, Labs, Weight trends  REASON FOR ASSESSMENT:   Consult Assessment of nutrition requirement/status  ASSESSMENT:   52 y/o male with h/o glomerulonephritis (age 82) s/p renal transplant (1999) and right and left renal transplants (2015), ESRD on HD (>20 yrs), HTN, bipolar 2 disorder, PTSD, ADHD, anxiety, depression, GERD, OSA, NICM, thrombocytopenia, pulmonary hypertension, afib s/p DCCV (03/2019), CHF, severe mitral and tricuspid valve regurgitation and moderate aortic stenosis s/p mitral valve repair, tricuspid valve annuloplasty, aortic valve replacement and full right and left maze procedure 8/16.  9/12 +emesis x 2, TF held, Repeat FEES-NPO, sig dysphagia and newly noted vocal cord paralysis  Pt remains extremely weak and deconditioned.  Mental status remains essentially unchanged; pt appears lethargic but arousable and will answer questions.   FEES today with persistent dysphagia and newly noted left vocal cord paralysis, pt remains NPO. Noted plan for MBS in future  CRRT stopped yesterday. Noted wt trending up to 78.8 kg today. 76.9 kg yesterday. Plan for iHD  TF currently on hold; +emesis x 2 today. Abd xray. N/V has been a constant theme through out his admission. TF have been on and off because of this. Pt severely malnourished on admission and does not apear to have improved much nutritional since providing enteral support. Recommend initiation of TPN (supplemental TPN) if some TF infusing.   Abd xray today with nonobstructive bowel gas pattern.   A-line removed yesterday, cuff measures since change of shift this AM have been consistently <55 and diastolic pressure as low as 27. ASPEN guidelines recommend diastolic pressure 50 or greater. Question if poor gut perfusion is leading to N/V and TF intolerance. This concern has been discussed extensively with PCCM  Hypercalcemia persists-corrected calcium 14.3 (serum calcium 12.9 with albumin 2.2); nephrology is aware and indicating likely related to immobility. Potassium and phosphorus wdl  Noted newly documented stage 2 pressure injury to sacrum  Repeat folic acid 16.7 (wdl), up from 3.6 on 8/19 with supplementation  Labs: reviewed Meds: renal MVI BID, thiamine, folic acid    Diet Order:   Diet Order             Diet NPO time specified  Diet effective midnight                   EDUCATION NEEDS:   Not appropriate for education at this time  Skin:  Skin Assessment: Skin Integrity Issues: Skin Integrity Issues:: Incisions Incisions: sternal  Last BM:  9/9 small volume type 7 watery stool via rectal tube  Height:   Ht Readings from Last 1 Encounters:  06/30/2023 6\' 3"  (1.905 m)    Weight:   Wt Readings from Last 1 Encounters:  07/20/23 78.8 kg     Ideal Body Weight:  89 kg  BMI:  Body mass index is 21.71 kg/m.  Estimated Nutritional Needs:   Kcal:  2400-2600 kcals  Protein:  155-190 g  Fluid:  1L plus UOP  [name] MS, RDN, LDN, CNSC Registered Dietitian 3 Clinical Nutrition RD Pager and On-Call Pager Number Located in [place]

## 2023-07-20 NOTE — Progress Notes (Signed)
Pt receives out-pt HD at [facility] on MWF 3:30 pm chair time. Clinic advised pt is still hospitalized and that navigator will provide update on pt's d/c date once known. Will assist as needed.   Olivia Canter Renal Navigator [phone]

## 2023-07-20 NOTE — Progress Notes (Signed)
NAME:  [name], MRN:  [id number], DOB:  Nov 04, 1971, LOS: 28 ADMISSION DATE:  07/07/2023, CONSULTATION DATE: 06/09/2023 REFERRING MD: Leafy Ro - TCTS, CHIEF COMPLAINT: Postcardiac surgery  History of Present Illness:  This is a 52 year old gentleman, past medical history of anxiety depression, bipolar disease, congestive heart failure chronic diastolic heart failure kidney disease followed by renal transplant in 1998/09/27 repeat renal transplant in 09/27/14.  Patient underwent surgery today for severe mitral regurgitation severe tricuspid regurgitation and moderate aortic stenosis.  He also has atrial fibrillation received maze procedure.  He had a complex mitral valve repair a tricuspid valve annuloplasty and aortic valve replacement followed by full right and left maze procedure.  Pertinent Medical History:   Past Medical History:  Diagnosis Date   ADHD (attention deficit hyperactivity disorder)    Anemia    Anemia, chronic disease 07/13/2011   Anxiety    Arthritis    Bipolar 2 disorder (HCC) 08/18/2011   Diagnosed in 09-27-2010.    Bipolar disorder (HCC)    Blood dyscrasia    Nephrotic Anemia   Blood transfusion    Blood transfusion without reported diagnosis    Chronic diastolic CHF (congestive heart failure) (HCC)    Complication of anesthesia    Woke up intubated gets combative  afraid to be alone    Congestive heart failure (CHF) (HCC) 07/13/2011   Onset 09-27-2005.  Followed by Dr. Eden Emms.  S/p cardiac catheterization in [facility] Dr. Anne Fu..  S/p cardiac catheterization in 27-Sep-2013 by Nishan.  Echo 2013/09/27.    Coronary artery disease    Depression    Dysrhythmia    A. Fib 09-28-23 with DCCV in May 2024   Erectile dysfunction    ESRD on hemodialysis (HCC) 07/13/2011   Glomerulonephritis at age 8, started HD in Sep 28, 1995.  Deceased donor renal transplant 09/27/1998 at [facility], then kidney failed and went back on HD in Sep 27, 2005.  L forearm AVF . s/p repeat renal transplant 10/2014 [facility].   GI bleed 11/01/2011   Rectal  bleeding with emesis/diarrhea    Heart murmur    Hemodialysis status (HCC)    HSV (herpes simplex virus) infection    Hydrocele, right 11/25/2014   Hypertension    Hypertensive emergency 01/06/2013   Hypoglycemia 01/11/2013   Influenza-like illness 12/18/2013   Mild ascending aorta dilatation (HCC)    NICM (nonischemic cardiomyopathy) (HCC)    Pericardial effusion    a. Mod by echo in 2013/09/27, similar to prior.   Peripheral polyneuropathy 08/04/2016   Pneumonia    Pulmonary edema 01/06/2013   Pulmonary hypertension associated with end stage renal disease on dialysis [facility]) 07/13/2011   Renal transplant, status post 11/25/2014   Pt with Glomerulonephritis at age 64. Deceased donor renal transplant 1998/09/27 right and left renal transplant 10/2014.  Followed by [facility] transplant team; nephrologist is Dr. Lonna Cobb.    Respiratory failure with hypoxia (HCC) 01/06/2013   Shortness of breath 12/12/2012   Sleep apnea    Swollen testicle 07/13/2011   URI (upper respiratory infection) 01/25/2012    Significant Hospital Events: Including procedures, antibiotic start and stop dates in addition to other pertinent events   8/15 - AVR, MVR, TVR with TCTS ([place]). Remains on multiple pressors (NE, Epi, vaso). 8/16 - LIJ Trialysis catheter placed for CRRT.  8/17 extubated  8/18 - remains on CVVHD, 3 pressors  8/19-8/20 improving pressor needs 8/21 agitated delirium; cortrak 8/26 CRRT restarted 8/27 TVP placed, brady overnight and pacing turned to 80 8/30 palli consult  9/1 PRBC for low coox  9/2 coox down a bit again  9/3CRRT off overnight due to access issues. Remains on vaso 0.04, epi 10, norepi 2, DBA 5. 9/4 RHC, started iNO 9/6 repeat RHC, fistula ligated 9/9 Remains on vaso 0.04, epi . 30 midodrine TID, Temp pacer, small effusions on CXR , mild interstitial edema, left lower lobe consolidation most likely atelectasis , afebrile 9/10 intermittent vasopressor. Echo continue to show  severe RV dysfunction. 9/11 [place] cvc to fem site in prep for possible temp perm  9/12 intolerant of EN, vomiting.    Interim History / Subjective:  Vomiting. EN subsequently off.   Objective:  Blood pressure (!) 105/49, pulse 82, temperature 97.7 F (36.5 C), temperature source Oral, resp. rate (!) 29, height 6\' 3"  (1.905 m), weight 78.8 kg, SpO2 94%. CVP:  [7 mmHg-25 mmHg] 14 mmHg      Intake/Output Summary (Last 24 hours) at 07/20/2023 1014 Last data filed at 07/20/2023 0700 Gross per 24 hour  Intake 1837.36 ml  Output 225 ml  Net 1612.36 ml   Filed Weights   07/18/23 0323 07/19/23 0339 07/20/23 0414  Weight: 75.6 kg 76.9 kg 78.8 kg   Physical Examination:  General:  Chronically and acutely ill appearing middle aged M  HEENT: NCAT pink mm cortrak. Temporal muscle wasting  Neuro: Generalized weakness, hard time holding head up. No focal def. AAOx4  CV: paced. c PULM:  non labored, mostly clear, few scattered rhonchi, 4L [place] GI: soft, bs+, NT/ ND, flexiseal Extremities: warm/dry, no LE edema  Skin: no rashes    Assessment & Plan:    Acute resp failure w hypoxia and hypercarbia  -pulm hygiene, mobility, Green for spO2 > 90   MVR TVR AS s/p bioprosthetic replacements  CHB  RV failure Cardiogenic shock, improved Pericardial effusion without tamponade  Afib s/p MAZE  Sever pHTN  -temp perm device 9/13 w EP -cardiac monitoring -optimize lytes  -off iNO   ESRD on iHD CKD s/p failed Rtxp 1999, 2015  - nephro following, off CRRT and back on iHD   H/o HSV - con't acyclovir, PTA med Anemia, multifactorial -critical dz and chronic illness driving -expected post op ABLA now resolved  -follow CBC -aranesp  Severe kcal/protein malnutrition EN intolerance  -EN on hold w vomiting -if we can't make progress in the coming day or two might need to consider TPN -- has had ongoing critical illness (incr catabolic state) x about a month superimposed on baseline chronic  malnutrition   Physical deconditioning due to prolonged critical illness + significant chronic dz burden  -PT/OT, SLP - anticipate CIR eventually    Best Practice (right click and "Reselect all SmartList Selections" daily)   Diet/type:TF  DVT prophylaxis: heparin sq GI prophylaxis: PPI Lines: Central line and Dialysis Catheter Foley:  No  Code Status:  full code Last date of multidisciplinary goals of care discussion [Per Primary Team]  CCT n/a  High MDM  Tessie Fass MSN, AGACNP-BC [place] [facility] Amion for pager 07/20/2023, 10:14 AM

## 2023-07-20 NOTE — Progress Notes (Signed)
[facility] Progress Note  Subjective:  Seen in room Off CRRT yesterday 7pm Not on pressors, on RA  Vitals:   07/20/23 0600 07/20/23 0615 07/20/23 0630 07/20/23 0700  BP: (!) 102/50  (!) 102/47 (!) 105/49  Pulse: 82 82 82 82  Resp: (!) 30 (!) 33 (!) 29 (!) 29  Temp:      TempSrc:      SpO2: 96% 96% 95% 94%  Weight:      Height:        Physical Exam:       General adult male in bed  Lungs clear but reduced to auscultation bilaterally  Heart S1S2 Abdomen soft nontender nondistended Extremities no edema appreciated Neuro - resting, somnolent, awakens easily Access left internal jugular tunneled dialysis catheter    OP HD: [facility] triad MWF 3rd shift 3h    F250     94kg   2K/2.5 Ca bath   RUE AVF    Summary: [name] is a/an 52 y.o. 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 Underwent aortic biovalve replacement, TV repair, MV repari, bilat Maze procedures.    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  Per cardiothoracic surgery  # Complete heart block - post-op, per EP/ cardiology, still with temp pacer but plans for permanent option in process  # Possible high-output heart failure - per CHF. SP AVF ligation 9/06 by VVS. Off pressors  # ESRD - pt was too hypotensive for hemodialysis post-op.  CRRT started 8/16 and  stopped 07/19/23.  Plan for attempt at [facility] 9/13: 3.5h, 1L UF, bolus albumin support, 2K bath.  Using [facility]  # HD access - AVF ligated for CHF. Now s/p [facility] 07/19/23 with IR>  Working well so far   # Distributive shock - Of fpressors, remains on high dose midodrine.  As above  # pHTN - severe by RHC --> Off NO2  # Volume - improved, wt's stable.   # Pericardial effusion - per CT surgery and CHF.  We are optimizing volume status with CRRT as above.   Assessed as moderate on last TTE   # Anemia of ESRD - transfuse as needed per primary team. Aranesp increased to 150 mcg weekly on Tuesdays    # MBD ckd -  Phos acceptable.  No binders currently as on CRRT - normally on renvela.    # Hypercalcemia - remains mild- moderate. Suspect due to immobility. Usually mild-mod hypercalcemia does not require treatment. Will follow.    [name], MD  07/20/2023, 10:42 AM  Recent Labs  Lab 07/18/23 0423 07/18/23 1840 07/19/23 0346 07/19/23 1656 07/20/23 0406  HGB 7.7*  --  7.8*  --   --   ALBUMIN 2.3*   < > 2.2* 2.2* 2.2*  CALCIUM 11.7*   < > 11.8* 12.4* 12.9*  PHOS  --    < > 2.4* 2.8 3.1  CREATININE 1.61*   < > 1.74* 1.76* 2.38*  K 4.8   < > 4.6 4.4 4.6   < > = values in this interval not displayed.    Inpatient medications:  aspirin  81 mg Per Tube Daily   atorvastatin  10 mg Per Tube QHS   azelastine  2 spray Each Nare BID  Chlorhexidine Gluconate Cloth  6 each Topical Daily   cinacalcet  30 mg Oral Q supper   darbepoetin (ARANESP) injection - DIALYSIS  150 mcg Subcutaneous Q Tue-1800   escitalopram  10 mg Per Tube Daily   folic acid  1 mg Per Tube Daily   Gerhardt's butt cream   Topical Daily   guaiFENesin  15 mL Per Tube BID   heparin flush  10 Units Intracatheter Once   heparin injection (subcutaneous)  5,000 Units Subcutaneous Q12H   influenza vac split trivalent PF  0.5 mL Intramuscular Once   insulin aspart  0-9 Units Subcutaneous Q4H   lidocaine-EPINEPHrine  20 mL Other Once   midodrine  30 mg Per Tube TID   multivitamin  1 tablet Per Tube BID   mouth rinse  15 mL Mouth Rinse 4 times per day   mouth rinse  15 mL Mouth Rinse 4 times per day   pantoprazole (PROTONIX) IV  40 mg Intravenous Q12H   sodium chloride flush  3 mL Intravenous Q12H   thiamine  100 mg Oral Daily     prismasol BGK 4/2.5 400 mL/hr at 07/19/23 0517    prismasol BGK 4/2.5 400 mL/hr at 07/18/23 2247   sodium chloride 10 mL/hr at 07/20/23 0700    albumin human Stopped (06/23/23 0040)   epinephrine Stopped (07/18/23 0703)   feeding supplement (PIVOT 1.5 CAL) 70 mL/hr at 07/20/23 0700   prismasol BGK 4/2.5 1,800 mL/hr at 07/19/23 1549   vasopressin Stopped (07/18/23 1420)   sodium chloride, acetaminophen, albumin human, bisacodyl **OR** bisacodyl, clonazepam, docusate, heparin, ondansetron (ZOFRAN) IV, mouth rinse, mouth rinse, oxyCODONE, phenol, polyethylene glycol, simethicone, sodium chloride flush

## 2023-07-20 NOTE — Plan of Care (Signed)
  Problem: Education: Goal: Knowledge of General Education information will improve Description: Including pain rating scale, medication(s)/side effects and non-pharmacologic comfort measures Outcome: Progressing   Problem: Health Behavior/Discharge Planning: Goal: Ability to manage health-related needs will improve Outcome: Progressing   Problem: Clinical Measurements: Goal: Ability to maintain clinical measurements within normal limits will improve Outcome: Progressing Goal: Will remain free from infection Outcome: Progressing Goal: Diagnostic test results will improve Outcome: Progressing Goal: Respiratory complications will improve Outcome: Progressing Goal: Cardiovascular complication will be avoided Outcome: Progressing   Problem: Activity: Goal: Risk for activity intolerance will decrease Outcome: Progressing   Problem: Nutrition: Goal: Adequate nutrition will be maintained Outcome: Progressing   Problem: Coping: Goal: Level of anxiety will decrease Outcome: Progressing   Problem: Elimination: Goal: Will not experience complications related to bowel motility Outcome: Progressing   Problem: Pain Managment: Goal: General experience of comfort will improve Outcome: Progressing   Problem: Safety: Goal: Ability to remain free from injury will improve Outcome: Progressing   Problem: Skin Integrity: Goal: Risk for impaired skin integrity will decrease Outcome: Progressing   Problem: Education: Goal: Knowledge of disease or condition will improve Outcome: Progressing Goal: Knowledge of the prescribed therapeutic regimen will improve Outcome: Progressing   Problem: Activity: Goal: Risk for activity intolerance will decrease Outcome: Progressing   Problem: Cardiac: Goal: Will achieve and/or maintain hemodynamic stability Outcome: Progressing   Problem: Clinical Measurements: Goal: Postoperative complications will be avoided or minimized Outcome:  Progressing   Problem: Respiratory: Goal: Respiratory status will improve Outcome: Progressing   Problem: Skin Integrity: Goal: Wound healing without signs and symptoms of infection Outcome: Progressing Goal: Risk for impaired skin integrity will decrease Outcome: Progressing   Problem: Urinary Elimination: Goal: Ability to achieve and maintain adequate renal perfusion and functioning will improve Outcome: Progressing   Problem: Education: Goal: Understanding of CV disease, CV risk reduction, and recovery process will improve Outcome: Progressing Goal: Individualized Educational Video(s) Outcome: Progressing   Problem: Activity: Goal: Ability to return to baseline activity level will improve Outcome: Progressing   Problem: Cardiovascular: Goal: Ability to achieve and maintain adequate cardiovascular perfusion will improve Outcome: Progressing Goal: Vascular access site(s) Level 0-1 will be maintained Outcome: Progressing   Problem: Health Behavior/Discharge Planning: Goal: Ability to safely manage health-related needs after discharge will improve Outcome: Progressing   Problem: Education: Goal: Ability to describe self-care measures that may prevent or decrease complications (Diabetes Survival Skills Education) will improve Outcome: Progressing Goal: Individualized Educational Video(s) Outcome: Progressing   Problem: Coping: Goal: Ability to adjust to condition or change in health will improve Outcome: Progressing   Problem: Fluid Volume: Goal: Ability to maintain a balanced intake and output will improve Outcome: Progressing   Problem: Health Behavior/Discharge Planning: Goal: Ability to identify and utilize available resources and services will improve Outcome: Progressing Goal: Ability to manage health-related needs will improve Outcome: Progressing   Problem: Metabolic: Goal: Ability to maintain appropriate glucose levels will improve Outcome:  Progressing   Problem: Nutritional: Goal: Maintenance of adequate nutrition will improve Outcome: Progressing Goal: Progress toward achieving an optimal weight will improve Outcome: Progressing   Problem: Skin Integrity: Goal: Risk for impaired skin integrity will decrease Outcome: Progressing   Problem: Tissue Perfusion: Goal: Adequacy of tissue perfusion will improve Outcome: Progressing

## 2023-07-20 NOTE — Progress Notes (Signed)
Physical Therapy Treatment Patient Details Name: [name] MRN: [id number] DOB: 06-May-1971 Today's Date: 07/20/2023   History of Present Illness Pt is a 52 y.o. male admitted 06/27/2023 for same day planned tricuspid valve repair, mitral valve repair, aortic valve replacement, maze, TEE. Post-op course complicated by CHB, shock, AKI, respiratory failure. CRRT initiated 8/16, then resumed 8/26. ETT 8/15-8/17. RHC 9/4 and 9/6. PMH includes CHF, HTN, NICM, polyneuropathy, ESRD on HD (s/p renal transplant 1999, 2015), bipolar, ADHD, anxiety, depression.    PT Comments  Pt tolerated treatment well with emphasis on warm up ex, transition to EOB, scooting and sitting balance and sit to stand with short bout of pre-gait activity mostly with mod assist.     If plan is discharge home, recommend the following: A lot of help with walking and/or transfers;A lot of help with bathing/dressing/bathroom;Assistance with cooking/housework;Assist for transportation;Help with stairs or ramp for entrance   Can travel by private vehicle        Equipment Recommendations   (TBD)    Recommendations for Other Services       Precautions / Restrictions Precautions Precautions: Fall;Sternal Precaution Comments: CRRT, external pacer in neck     Mobility  Bed Mobility Overal bed mobility: Needs Assistance Bed Mobility: Supine to Sit, Sit to Supine     Supine to sit: Mod assist Sit to supine: Mod assist   General bed mobility comments: forward and stability assist    Transfers Overall transfer level: Needs assistance Equipment used: 1 person hand held assist Transfers: Sit to/from Stand Sit to Stand: Mod assist, +2 safety/equipment           General transfer comment: cues for sternal precautions, assist forward with some boost.    Ambulation/Gait               General Gait Details: a few steps in place limited by painful R knee to 2-3 reps.   Stairs             Wheelchair  Mobility     Tilt Bed    Modified Rankin (Stroke Patients Only)       Balance Overall balance assessment: Needs assistance   Sitting balance-Leahy Scale: Fair       Standing balance-Leahy Scale: Poor Standing balance comment: reliant on external support                            Cognition Arousal: Alert Behavior During Therapy: Flat affect Overall Cognitive Status: Within Functional Limits for tasks assessed                                          Exercises Other Exercises Other Exercises: hip/knee flex/ext with graded assist progressing to graded resistance x 10 reps.    General Comments General comments (skin integrity, edema, etc.): BP 102/68      Pertinent Vitals/Pain Pain Assessment Pain Assessment: Faces Faces Pain Scale: Hurts even more Pain Location: R knee Pain Descriptors / Indicators: Discomfort, Grimacing Pain Intervention(s): Limited activity within patient's tolerance, Monitored during session    Home Living                          Prior Function            PT Goals (current goals can now be found in  the care plan section) Acute Rehab PT Goals Patient Stated Goal: regain independence PT Goal Formulation: With patient Time For Goal Achievement: 07/26/23 Potential to Achieve Goals: Fair Progress towards PT goals: Progressing toward goals    Frequency    Min 1X/week      PT Plan      Co-evaluation              AM-PAC PT "6 Clicks" Mobility   Outcome Measure  Help needed turning from your back to your side while in a flat bed without using bedrails?: A Lot Help needed moving from lying on your back to sitting on the side of a flat bed without using bedrails?: A Lot Help needed moving to and from a bed to a chair (including a wheelchair)?: A Lot Help needed standing up from a chair using your arms (e.g., wheelchair or bedside chair)?: A Lot Help needed to walk in hospital room?:  Total Help needed climbing 3-5 steps with a railing? : Total 6 Click Score: 10    End of Session Equipment Utilized During Treatment: Oxygen   Patient left: in bed;with call bell/phone within reach;with bed alarm set;with family/visitor present Nurse Communication: Mobility status PT Visit Diagnosis: Other abnormalities of gait and mobility (R26.89);Muscle weakness (generalized) (M62.81)     Time: 1610-9604 PT Time Calculation (min) (ACUTE ONLY): 21 min  Charges:    $Therapeutic Activity: 8-22 mins PT General Charges $$ ACUTE PT VISIT: 1 Visit                     07/20/2023  [name]., PT Acute Rehabilitation Services [phone]  (office)   [name] 07/20/2023, 5:26 PM

## 2023-07-20 NOTE — Progress Notes (Signed)
eLink Physician-Brief Progress Note Patient Name: [name] DOB: 06-Apr-1971 MRN: [id number]   Date of Service  07/20/2023  HPI/Events of Note  Serum calcium 14.1, albumin 2.2  eICU Interventions  Pamidronate 60 mg iv x 1, Calcitonin, patient is scheduled for hemodialysis in the AM        [name] 07/20/2023, 9:52 PM

## 2023-07-20 NOTE — Progress Notes (Signed)
  Patient Name: [name] Date of Encounter: 07/20/2023  Primary Cardiologist: Reatha Harps, MD Electrophysiologist: Lanier Prude, MD  Interval Summary   S/p tunneled HD cath 9/11 R Subclavian central line out -> Femoral central line to let chest rest for possible temp perm 9/13  Off CVVHD. For IHD today.   Vital Signs    Vitals:   07/20/23 0600 07/20/23 0615 07/20/23 0630 07/20/23 0700  BP: (!) 102/50  (!) 102/47 (!) 105/49  Pulse: 82 82 82 82  Resp: (!) 30 (!) 33 (!) 29 (!) 29  Temp:      TempSrc:      SpO2: 96% 96% 95% 94%  Weight:      Height:        Intake/Output Summary (Last 24 hours) at 07/20/2023 0823 Last data filed at 07/20/2023 0700 Gross per 24 hour  Intake 2020.12 ml  Output 260 ml  Net 1760.12 ml   Filed Weights   07/18/23 0323 07/19/23 0339 07/20/23 0414  Weight: 75.6 kg 76.9 kg 78.8 kg    Physical Exam    GEN- The patient is chronically ill appearing, cachectic. Somewhat more alert today.   Lungs- Diminished throughout.  Cardiac- Regular rate and rhythm ( V paced) GI- soft, NT, ND, + BS Extremities- Trace to 1+ BLE edema.   Telemetry    V pacing at 82 bpm, no underlying when threshold tested (personally reviewed)  Hospital Course    [name] is a 52 y.o. male admitted for 52 y/o AAM w/ mildly reduced systolic heart failure (EF once 45-50% in 2013 but normal on subsequent echos) valvular heart disease including severe AS, 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 EF 55-60% w/ mild RV dysfunction by echo in 6/24.    Admitted for elective triple vale surgery + MAZE. S/p bioprosthetic aortic valve replacement + mitral and tricuspid valve repairs. Post-op course c/b persistent vasoplagic syndrome.   Assessment & Plan    CHB S/p AVR/TVr/MVr/MAZE/LAAC Epicardial wires with intermittent capture  and worsening threshold Now s/p RIJ temp wire.  No escape today.  Threshold slightly increased with intermittent capture at 1.0 this am.  His options remain poor.  He is very poor candidate for permanent transvenous pacing.  He would potentially be a candidate for Leadless pacing, but at this time is not a candidate for general sedation with ongoing iNO.   Tentatively planning on a temp-perm system tomorrow to bridge him to recovery to consideration of a leadless PPM.  Notably, he will be chronically RV pacing and is not a candidate for transvenous device.    ESRD S/p tunneled HD line 9/11 Per primary team Off all pressors as of 9/10. To attempt iHD today.    Acute on Chronic Systolic Heart Failure w/ Prominent RV Dysfunction  Post-op Vasopalegia/high-output HF Echo 08/29: Moderate pericardial effusion, no tamponade RHC c/w high output HF physiology 2/2 AV fistula despite severe RV dysfunction  s/p AVF ligation on 9/6 Off pressors and trying to get all extra lines out.  Off iNO.  Remains on Midodrine 30 mg TID.   Tentatively planning for temp perm tomorrow, 9/13.  For questions or updates, please contact [facility] Please consult [email or web address] for contact info under Cardiology/STEMI.  Signed, Graciella Freer, PA-C  07/20/2023, 8:23 AM

## 2023-07-20 NOTE — Procedures (Signed)
Objective Swallowing Evaluation: Type of Study: Bedside Swallow Evaluation   Patient Details  Name: [name] MRN: [id number] Date of Birth: 11-13-1970  Today's Date: 07/20/2023 Time: SLP Start Time (ACUTE ONLY): 1045 -SLP Stop Time (ACUTE ONLY): 1115  SLP Time Calculation (min) (ACUTE ONLY): 30 min   Past Medical History:  Past Medical History:  Diagnosis Date   ADHD (attention deficit hyperactivity disorder)    Anemia    Anemia, chronic disease 07/13/2011   Anxiety    Arthritis    Bipolar 2 disorder (HCC) 08/18/2011   Diagnosed in 10-17-2010.    Bipolar disorder (HCC)    Blood dyscrasia    Nephrotic Anemia   Blood transfusion    Blood transfusion without reported diagnosis    Chronic diastolic CHF (congestive heart failure) (HCC)    Complication of anesthesia    Woke up intubated gets combative  afraid to be alone    Congestive heart failure (CHF) (HCC) 07/13/2011   Onset 17-Oct-2005.  Followed by Dr. Eden Emms.  S/p cardiac catheterization in [facility] Dr. Anne Fu..  S/p cardiac catheterization in 2013-10-17 by Nishan.  Echo 10/17/2013.    Coronary artery disease    Depression    Dysrhythmia    A. Fib 10/18/2023 with DCCV in May 2024   Erectile dysfunction    ESRD on hemodialysis (HCC) 07/13/2011   Glomerulonephritis at age 51, started HD in October 18, 1995.  Deceased donor renal transplant 1998/10/17 at [facility], then kidney failed and went back on HD in Oct 17, 2005.  L forearm AVF . s/p repeat renal transplant 10/2014 [facility].   GI bleed 11/01/2011   Rectal bleeding with emesis/diarrhea    Heart murmur    Hemodialysis status (HCC)    HSV (herpes simplex virus) infection    Hydrocele, right 11/25/2014   Hypertension    Hypertensive emergency 01/06/2013   Hypoglycemia 01/11/2013   Influenza-like illness 12/18/2013   Mild ascending aorta dilatation (HCC)    NICM (nonischemic cardiomyopathy) (HCC)    Pericardial effusion    a. Mod by echo in 10-17-13, similar to prior.   Peripheral polyneuropathy 08/04/2016   Pneumonia     Pulmonary edema 01/06/2013   Pulmonary hypertension associated with end stage renal disease on dialysis [facility]) 07/13/2011   Renal transplant, status post 11/25/2014   Pt with Glomerulonephritis at age 53. Deceased donor renal transplant 10-17-98 right and left renal transplant 10/2014.  Followed by [facility] transplant team; nephrologist is Dr. Lonna Cobb.    Respiratory failure with hypoxia (HCC) 01/06/2013   Shortness of breath 12/12/2012   Sleep apnea    Swollen testicle 07/13/2011   URI (upper respiratory infection) 01/25/2012   Past Surgical History:  Past Surgical History:  Procedure Laterality Date   ANGIOPLASTY     AORTIC VALVE REPLACEMENT N/A 06/19/2023   Procedure: AORTIC VALVE REPLACEMENT (AVR) inspiris valve size 27;  Surgeon: Eugenio Hoes, MD;  Location: [facility] OR;  Service: Open Heart Surgery;  Laterality: N/A;   BUBBLE STUDY  09/05/2022   Procedure: BUBBLE STUDY;  Surgeon: Sande Rives, MD;  Location: [facility] ENDOSCOPY;  Service: Cardiovascular;;   CARDIOVERSION N/A 04/13/2023   Procedure: CARDIOVERSION;  Surgeon: Jake Bathe, MD;  Location: [facility] INVASIVE CV LAB;  Service: Cardiovascular;  Laterality: N/A;   CHOLECYSTECTOMY     DIALYSIS FISTULA CREATION     EVALUATION UNDER ANESTHESIA WITH HEMORRHOIDECTOMY N/A 03/15/2020   Procedure: CONTROL OF ANAL BLEEDING;  Surgeon: Andria Meuse, MD;  Location: [facility] OR;  Service: General;  Laterality: N/A;  IR FLUORO GUIDE CV LINE LEFT  07/19/2023   IR US GUIDE VASC ACCESS LEFT  07/19/2023   KIDNEY TRANSPLANT  10/17/2014   KNEE SURGERY Right    arthroscopic   LEFT HEART CATHETERIZATION WITH CORONARY ANGIOGRAM N/A 10/01/2013   Procedure: LEFT HEART CATHETERIZATION WITH CORONARY ANGIOGRAM;  Surgeon: Wendall Stade, MD;  Location: [facility] CATH LAB;  Service: Cardiovascular;  Laterality: N/A;   LIGATION OF ARTERIOVENOUS  FISTULA Right 07/29/2023   Procedure: LIGATION OF RIGHT ARTERIOVENOUS  FISTULA;  Surgeon: Chuck Hint, MD;   Location: [facility] OR;  Service: Vascular;  Laterality: Right;   MAZE N/A 06/16/2023   Procedure: MAZE;  Surgeon: Eugenio Hoes, MD;  Location: [facility] OR;  Service: Open Heart Surgery;  Laterality: N/A;   MITRAL VALVE REPAIR N/A 07/02/2023   Procedure: MITRAL VALVE REPAIR (MVR) utilizing Simulis Band size 30mm;  Surgeon: Eugenio Hoes, MD;  Location: [facility] OR;  Service: Open Heart Surgery;  Laterality: N/A;   MOUTH SURGERY     teeth removed   NEPHRECTOMY TRANSPLANTED ORGAN     RIGHT HEART CATH N/A 08/06/2023   Procedure: RIGHT HEART CATH;  Surgeon: Dolores Patty, MD;  Location: [facility] INVASIVE CV LAB;  Service: Cardiovascular;  Laterality: N/A;   RIGHT HEART CATH N/A 08/01/2023   Procedure: RIGHT HEART CATH;  Surgeon: Dolores Patty, MD;  Location: [facility] INVASIVE CV LAB;  Service: Cardiovascular;  Laterality: N/A;   RIGHT/LEFT HEART CATH AND CORONARY ANGIOGRAPHY N/A 11/24/2022   Procedure: RIGHT/LEFT HEART CATH AND CORONARY ANGIOGRAPHY;  Surgeon: Kathleene Hazel, MD;  Location: [facility] INVASIVE CV LAB;  Service: Cardiovascular;  Laterality: N/A;   TEE WITHOUT CARDIOVERSION N/A 09/05/2022   Procedure: TRANSESOPHAGEAL ECHOCARDIOGRAM (TEE);  Surgeon: Sande Rives, MD;  Location: [facility] ENDOSCOPY;  Service: Cardiovascular;  Laterality: N/A;   TEE WITHOUT CARDIOVERSION N/A 06/12/2023   Procedure: TRANSESOPHAGEAL ECHOCARDIOGRAM;  Surgeon: Eugenio Hoes, MD;  Location: [facility] OR;  Service: Open Heart Surgery;  Laterality: N/A;   TEMPORARY PACEMAKER N/A 06/17/2023   Procedure: TEMPORARY PACEMAKER;  Surgeon: Orbie Pyo, MD;  [facility]: [facility] INVASIVE CV LAB;  Service: Cardiovascular;  Laterality: N/A;   TRICUSPID VALVE REPLACEMENT N/A 06/18/2023   Procedure: TRICUSPID VALVE REPAIR MC3 Ring size 30;  Surgeon: Eugenio Hoes, MD;  Location: [facility] OR;  Service: Open Heart Surgery;  Laterality: N/A;   HPI: This is a 52 year old gentleman, Patient underwent surgery 8/15  for severe mitral regurgitation severe tricuspid  regurgitation and moderate aortic stenosis.  He also has atrial fibrillation received maze procedure.  He had a complex mitral valve repair a tricuspid valve annuloplasty and aortic valve replacement followed by full right and left maze procedure. Extubated 8/17, complicated course since extubation with decreased LOC, pt now off CRRT and on iHD. Past medical history of tracheostomy in 2011 (reason unknown, pt reports "real bad pna"), anxiety depression, bipolar disease, congestive heart failure chronic diastolic heart failure kidney disease followed by renal transplant in 1999 repeat renal transplant in 2015.   Subjective: Pt awake, alert. Obtained consent for FEES    Recommendations for follow up therapy are one component of a multi-disciplinary discharge planning process, led by the attending physician.  Recommendations may be updated based on patient status, additional functional criteria and insurance authorization.  Assessment / Plan / Recommendation     07/20/2023   12:00 PM  Clinical Impressions  Clinical Impression Pt presents with a moderate oropharyngeal dysphagia c/b delayed swallow evaluation, decreased base of tongue retraction, incomplete  laryngeal closure, and diminished sensation.  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 This is a new finding from FEES completed 8/20.  RN aware and notified MD.  These deficits resulted in penetration deep in laryngeal vestibule with suspected aspiration 2/2 coughing.  Reflexive cough did not clear penetration.  There was diffuse pharyngeal residue with thin liquids.  There was significant vallecula residue with puree.  Unfortunately pt had poor scope tolerance and further bolus trials were deferred. Immediate after completion of study, pt regugitated bolus trials.  Pt would prefer further evaluation by MBSS.  As he is no longer requiring CRRT, hopefully MBS can be scheduled early next week for swallowing reassessment  around HD schedule which is currently planned for MWF.  Recommend further work up to determine cause of LVF immobility.  Consider MRI.    Recommend pt remain NPO with alternate means of nutrition, hydration, and medication.  Pt may have small amounts of water and ice chips for comfort, after good oral care, in moderation, when fully awake/alert, with upright positioning and supervision.         07/20/2023   11:48 AM  Treatment Recommendations  Treatment Recommendations Therapy as outlined in treatment plan below        07/20/2023   12:00 PM  Prognosis  Prognosis for improved oropharyngeal function Fair       07/20/2023   11:48 AM  Diet Recommendations  SLP Diet Recommendations NPO;Alternative means - temporary  Medication Administration Via alternative means         07/20/2023   11:48 AM  Other Recommendations  Oral Care Recommendations Oral care QID  Follow Up Recommendations Skilled nursing-short term rehab (<3 hours/day)  Functional Status Assessment Patient has had a recent decline in their functional status and demonstrates the ability to make significant improvements in function in a reasonable and predictable amount of time.       07/20/2023   11:48 AM  Frequency and Duration   Speech Therapy Frequency (ACUTE ONLY) min 2x/week  Treatment Duration 2 weeks         07/20/2023   11:45 AM  Oral Phase  Oral - Thin Cup [facility]  Oral - Puree Reduced posterior propulsion       07/20/2023   11:46 AM  Pharyngeal Phase  Pharyngeal Phase Impaired  Pharyngeal- Thin Straw Reduced airway/laryngeal closure;Reduced tongue base retraction;Penetration/Aspiration during swallow;Pharyngeal residue - valleculae;Pharyngeal residue - pyriform;Lateral channel residue  Pharyngeal Material enters airway, remains ABOVE vocal cords and not ejected out  Pharyngeal- Puree Delayed swallow initiation-vallecula;Reduced airway/laryngeal closure;Reduced tongue base retraction;Pharyngeal residue -  valleculae  Pharyngeal Material does not enter airway        07/20/2023   11:48 AM  Cervical Esophageal Phase   Cervical Esophageal Phase Impaired; Regurgitation     Kerrie Pleasure, MA, CCC-SLP Acute Rehabilitation Services Office: [phone] 07/20/2023, 12:01 PM

## 2023-07-20 NOTE — Progress Notes (Signed)
      [address]       [name] [address]             [phone]      Was able to stand briefly today Swallow study- FEES showed paralyzed left vocal cord, still NPO BP (!) 90/40   Pulse 82   Temp 97.7 F (36.5 C) (Oral)   Resp (!) 27   Ht 6\' 3"  (1.905 m)   Wt 78.8 kg   SpO2 94%   BMI 21.71 kg/m  CVP 4-19 (?) Back on vasopressin 0.02  Shantasia Hunnell C. Dorris Fetch, MD [facility] [phone]

## 2023-07-20 NOTE — Progress Notes (Signed)
[address]       [place] [address]             [phone]      6 Days Post-Op  Procedure(s) (LRB): LIGATION OF RIGHT ARTERIOVENOUS  FISTULA (Right)   Total Length of Stay:  LOS: 28 days    SUBJECTIVE: Has tunneled dialysis catheter No issues overnight Has been off CRRT from last night Vitals:   07/20/23 0630 07/20/23 0700  BP: (!) 102/47 (!) 105/49  Pulse: 82 82  Resp: (!) 29 (!) 29  Temp:    SpO2: 95% 94%    Intake/Output      09/11 0701 09/12 0700 09/12 0701 09/13 0700   I.V. (mL/kg) 565.1 (7.2)    NG/GT 1395    IV Piggyback 100    Total Intake(mL/kg) 2060.1 (26.1)    Stool 35    CRRT 225    Total Output 260    Net +1800.1              prismasol BGK 4/2.5 400 mL/hr at 07/19/23 0517    prismasol BGK 4/2.5 400 mL/hr at 07/18/23 2247   sodium chloride 10 mL/hr at 07/20/23 0700   albumin human Stopped (06/23/23 0040)   epinephrine Stopped (07/18/23 0703)   feeding supplement (PIVOT 1.5 CAL) 70 mL/hr at 07/20/23 0700   prismasol BGK 4/2.5 1,800 mL/hr at 07/19/23 1549   vasopressin Stopped (07/18/23 1420)    CBC    Component Value Date/Time   WBC 5.9 07/19/2023 0346   RBC 2.39 (L) 07/19/2023 0346   HGB 7.8 (L) 07/19/2023 0346   HCT 26.7 (L) 07/19/2023 0346   PLT 229 07/19/2023 0346   MCV 111.7 (H) 07/19/2023 0346   MCH 32.6 07/19/2023 0346   MCHC 29.2 (L) 07/19/2023 0346   RDW 19.6 (H) 07/19/2023 0346   LYMPHSABS 1.6 07/18/2023 0423   MONOABS 1.3 (H) 07/18/2023 0423   EOSABS 0.4 07/18/2023 0423   BASOSABS 0.1 07/18/2023 0423   CMP     Component Value Date/Time   NA 130 (L) 07/20/2023 0406   K 4.6 07/20/2023 0406   CL 101 07/20/2023 0406   CO2 26 07/20/2023 0406   GLUCOSE 105 (H) 07/20/2023 0406   BUN 27 (H) 07/20/2023 0406   CREATININE 2.38 (H) 07/20/2023 0406   CALCIUM 12.9 (H) 07/20/2023 0406   CALCIUM 9.8 02/18/2011 1007   PROT 8.9 (H) 07/18/2023 0423   ALBUMIN 2.2 (L) 07/20/2023 0406   AST 45 (H)  07/18/2023 0423   ALT 17 07/18/2023 0423   ALKPHOS 117 07/18/2023 0423   BILITOT 0.5 07/18/2023 0423   GFRNONAA 32 (L) 07/20/2023 0406   GFRAA 10 (L) 03/16/2020 0825   ABG    Component Value Date/Time   PHART 7.317 (L) 07/11/2023 1346   PCO2ART 48.2 (H) 07/11/2023 1346   PO2ART 103 07/11/2023 1346   HCO3 27.9 07/19/2023 1038   HCO3 27.6 07/30/2023 1038   TCO2 29 07/10/2023 1038   TCO2 29 07/09/2023 1038   ACIDBASEDEF 1.0 07/11/2023 1517   O2SAT 58.1 07/20/2023 0510   CBG (last 3)  Recent Labs    07/20/23 0023 07/20/23 0143 07/20/23 0324  GLUCAP 84 93 109*     ASSESSMENT: SP AVR MV/Tv repair MAZE Hemodynamics stable  Renal for regular dialysis attempt Heart block: for Temp perm wire tomorrow Still awaiting swallow study Need to start more moving to gain strength   Eugenio Hoes, MD 07/20/2023

## 2023-07-21 ENCOUNTER — Inpatient Hospital Stay (HOSPITAL_COMMUNITY): Payer: Medicare HMO

## 2023-07-21 DIAGNOSIS — I442 Atrioventricular block, complete: Secondary | ICD-10-CM | POA: Diagnosis not present

## 2023-07-21 DIAGNOSIS — I5023 Acute on chronic systolic (congestive) heart failure: Secondary | ICD-10-CM | POA: Diagnosis not present

## 2023-07-21 DIAGNOSIS — Z9889 Other specified postprocedural states: Secondary | ICD-10-CM | POA: Diagnosis not present

## 2023-07-21 LAB — CBC
HCT: 26.1 % — ABNORMAL LOW (ref 39.0–52.0)
Hemoglobin: 7.8 g/dL — ABNORMAL LOW (ref 13.0–17.0)
MCH: 32.8 pg (ref 26.0–34.0)
MCHC: 29.9 g/dL — ABNORMAL LOW (ref 30.0–36.0)
MCV: 109.7 fL — ABNORMAL HIGH (ref 80.0–100.0)
Platelets: 236 10*3/uL (ref 150–400)
RBC: 2.38 MIL/uL — ABNORMAL LOW (ref 4.22–5.81)
RDW: 19.2 % — ABNORMAL HIGH (ref 11.5–15.5)
WBC: 5.2 10*3/uL (ref 4.0–10.5)
nRBC: 1 % — ABNORMAL HIGH (ref 0.0–0.2)

## 2023-07-21 LAB — GLUCOSE, CAPILLARY
Glucose-Capillary: 116 mg/dL — ABNORMAL HIGH (ref 70–99)
Glucose-Capillary: 100 mg/dL — ABNORMAL HIGH (ref 70–99)
Glucose-Capillary: 105 mg/dL — ABNORMAL HIGH (ref 70–99)
Glucose-Capillary: 86 mg/dL (ref 70–99)
Glucose-Capillary: 74 mg/dL (ref 70–99)
Glucose-Capillary: 63 mg/dL — ABNORMAL LOW (ref 70–99)
Glucose-Capillary: 120 mg/dL — ABNORMAL HIGH (ref 70–99)

## 2023-07-21 LAB — RENAL FUNCTION PANEL
Albumin: 2.1 g/dL — ABNORMAL LOW (ref 3.5–5.0)
Albumin: 2.6 g/dL — ABNORMAL LOW (ref 3.5–5.0)
Anion gap: 8 (ref 5–15)
Anion gap: 10 (ref 5–15)
BUN: 54 mg/dL — ABNORMAL HIGH (ref 6–20)
BUN: 29 mg/dL — ABNORMAL HIGH (ref 6–20)
CO2: 24 mmol/L (ref 22–32)
CO2: 28 mmol/L (ref 22–32)
Calcium: 13.8 mg/dL (ref 8.9–10.3)
Calcium: 12.7 mg/dL — ABNORMAL HIGH (ref 8.9–10.3)
Chloride: 98 mmol/L (ref 98–111)
Chloride: 96 mmol/L — ABNORMAL LOW (ref 98–111)
Creatinine, Ser: 3.74 mg/dL — ABNORMAL HIGH (ref 0.61–1.24)
Creatinine, Ser: 2.49 mg/dL — ABNORMAL HIGH (ref 0.61–1.24)
GFR, Estimated: 19 mL/min — ABNORMAL LOW (ref >60)
GFR, Estimated: 31 mL/min — ABNORMAL LOW (ref >60)
Glucose, Bld: 110 mg/dL — ABNORMAL HIGH (ref 70–99)
Glucose, Bld: 88 mg/dL (ref 70–99)
Phosphorus: 4.1 mg/dL (ref 2.5–4.6)
Phosphorus: 3.1 mg/dL (ref 2.5–4.6)
Potassium: 5.2 mmol/L — ABNORMAL HIGH (ref 3.5–5.1)
Potassium: 4.3 mmol/L (ref 3.5–5.1)
Sodium: 130 mmol/L — ABNORMAL LOW (ref 135–145)
Sodium: 134 mmol/L — ABNORMAL LOW (ref 135–145)

## 2023-07-21 LAB — COOXEMETRY PANEL
Carboxyhemoglobin: 3.1 % — ABNORMAL HIGH (ref 0.5–1.5)
Methemoglobin: 0.9 % (ref 0.0–1.5)
O2 Saturation: 62.3 %
Total hemoglobin: 8 g/dL — ABNORMAL LOW (ref 12.0–16.0)

## 2023-07-21 LAB — MAGNESIUM: Magnesium: 2.8 mg/dL — ABNORMAL HIGH (ref 1.7–2.4)

## 2023-07-21 MED ORDER — VITAL 1.5 CAL PO LIQD
1000.0000 mL | ORAL | Status: DC
  Administered 2023-07-21 – 2023-07-25 (×4): 1000 mL

## 2023-07-21 MED ORDER — HEPARIN SODIUM (PORCINE) 1000 UNIT/ML DIALYSIS: 1000.0000 [IU] | INTRAMUSCULAR | Status: DC | PRN

## 2023-07-21 MED ORDER — HEPARIN SODIUM (PORCINE) 1000 UNIT/ML IJ SOLN
INTRAMUSCULAR | Status: AC
  Administered 2023-07-21: 3800 [IU]
  Filled 2023-07-21: qty 4

## 2023-07-21 MED ORDER — ALBUMIN HUMAN 25 % IV SOLN
25.0000 g | INTRAVENOUS | Status: AC | PRN
  Administered 2023-07-21 (×2): 25 g via INTRAVENOUS
  Filled 2023-07-21: qty 100

## 2023-07-21 MED ORDER — HEPARIN SODIUM (PORCINE) 5000 UNIT/ML IJ SOLN
5000.0000 [IU] | Freq: Three times a day (TID) | INTRAMUSCULAR | Status: DC
  Administered 2023-07-21 – 2023-07-24 (×8): 5000 [IU] via SUBCUTANEOUS
  Filled 2023-07-21 (×8): qty 1

## 2023-07-21 MED ORDER — ACYCLOVIR 200 MG PO CAPS
200.0000 mg | ORAL_CAPSULE | Freq: Two times a day (BID) | ORAL | Status: DC
  Administered 2023-07-21 – 2023-07-29 (×17): 200 mg
  Filled 2023-07-21 (×20): qty 1

## 2023-07-21 MED ORDER — PROSOURCE TF20 ENFIT COMPATIBL EN LIQD
60.0000 mL | ENTERAL | Status: DC
  Administered 2023-07-21 – 2023-07-27 (×34): 60 mL
  Filled 2023-07-21 (×30): qty 60

## 2023-07-21 MED ORDER — ANTICOAGULANT SODIUM CITRATE 4% (200MG/5ML) IV SOLN: 5.0000 mL | Status: DC | PRN

## 2023-07-21 MED ORDER — ALTEPLASE 2 MG IJ SOLR: 2.0000 mg | Freq: Once | INTRAMUSCULAR | Status: DC | PRN

## 2023-07-21 MED ORDER — DEXTROSE 50 % IV SOLN
1.0000 | Freq: Once | INTRAVENOUS | Status: AC
  Administered 2023-07-21: 50 mL via INTRAVENOUS
  Filled 2023-07-21: qty 50

## 2023-07-21 NOTE — Progress Notes (Signed)
Patient Name: [name] Date of Encounter: 07/21/2023  Primary Cardiologist: Reatha Harps, MD Electrophysiologist: Lanier Prude, MD  Interval Summary   For iHD this am.  Not sure how much/long her can lie flat.   Was back on vasopressin briefly overnight.   Had episodes of vomiting after swallow study which was notable for left vocal cord paralysis.   Vital Signs    Vitals:   07/21/23 0830 07/21/23 0845 07/21/23 0900 07/21/23 0910  BP: (!) 100/50 (!) 95/46 (S) (!) 85/43 (S) (!) 98/49  Pulse: 82 82 82 82  Resp: (!) 24 (!) 27 (!) 26 (!) 24  Temp:      TempSrc:      SpO2: 95% 96% 96% 95%  Weight:      Height:        Intake/Output Summary (Last 24 hours) at 07/21/2023 0914 Last data filed at 07/20/2023 1900 Gross per 24 hour  Intake 635.13 ml  Output --  Net 635.13 ml   Filed Weights   07/18/23 0323 07/19/23 0339 07/20/23 0414  Weight: 75.6 kg 76.9 kg 78.8 kg    Physical Exam    GEN- The patient is chronically ill appearing. Lethargic, stirs and responds to questioning.  Lungs- Diminished throughout.  Cardiac- Regular rate and rhythm (V paced)  GI- soft, NT, ND, + BS Extremities- Trace to 1+ BLE edema.   Telemetry    VP at 82 bpm (personally reviewed)  Hospital Course    [name] is a 52 y.o. male admitted for 52 y/o AAM w/ mildly reduced systolic heart failure (EF once 45-50% in 2013 but normal on subsequent echos) valvular heart disease including severe AS, 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 EF 55-60% w/ mild RV dysfunction by echo in 6/24.    Admitted for elective triple vale surgery + MAZE. S/p bioprosthetic aortic valve replacement + mitral and tricuspid valve repairs. Post-op course c/b persistent vasoplagic syndrome.  Assessment & Plan    CHB S/p AVR/TVr/MVr/MAZE/LAAC Epicardial wires with  intermittent capture and worsening threshold Now s/p RIJ temp wire.  No escape to 35 bpm when turned down yesterday, 9/12. Threshold slightly increased with intermittent capture at 1.0.  His options remain poor.  He is very poor candidate for permanent transvenous pacing.  He would potentially be a candidate for Leadless pacing, but at this time is not a candidate for general sedation with ongoing iNO.   Notably, he will be chronically RV pacing and is not a candidate for transvenous device.   Tentatively planning on a temp-perm system this afternoon to bridge him to recovery to consideration of a leadless PPM pending response to iHD and ability to lie flat   ESRD S/p tunneled HD line 9/11 Per primary team Off all pressors as of 9/10. Required vasopressor briefly overnight. ? Vagal hypotension from vomiting.  To attempt HD today.     Acute on Chronic Systolic Heart Failure w/ Prominent RV Dysfunction  Post-op Vasopalegia/high-output HF Echo 08/29: Moderate pericardial effusion, no tamponade RHC c/w high output HF physiology 2/2 AV fistula despite severe RV dysfunction  s/p AVF ligation on 9/6 Off pressors and trying to get all extra lines out.  Off iNO.  Remains on Midodrine 30 mg TID.   Tentatively for temp-perm today pending response to iHD and ability to lie flat.   For questions  or updates, please contact [facility] Please consult [email or web address] for contact info under Cardiology/STEMI.  Signed, Graciella Freer, PA-C  07/21/2023, 9:14 AM

## 2023-07-21 NOTE — Procedures (Signed)
Cortrak  Person Inserting Tube:  Greig Castilla D, RD Tube Type:  Cortrak - 43 inches Tube Size:  10 Tube Location:  Right nare Secured by: Bridle Technique Used to Measure Tube Placement:  Marking at nare/corner of mouth Cortrak Secured At:  85 cm Procedure Comments:  Cortrak Tube Team Note:  Consult received to place a Cortrak feeding tube.   X-ray is required, abdominal x-ray has been ordered by the Cortrak team. Please confirm tube placement before using the Cortrak tube.   If the tube becomes dislodged please keep the tube and contact the Cortrak team at [email or web address] for replacement.  If after hours and replacement cannot be delayed, place a NG tube and confirm placement with an abdominal x-ray.    Greig Castilla, RD, LDN Clinical Dietitian RD pager # available in AMION  After hours/weekend pager # available in [facility]

## 2023-07-21 NOTE — Procedures (Signed)
HD Note:  Some information was entered later than the data was gathered due to patient care needs. The stated time with the data is accurate.  Patient treatment done at bedside. Patient responds minimally to voice.  Eyes closed.  Informed consent signed and in chart.   Access used: Left upper chest HD catheter Access issues: None  Hypotension was an issue, saline given, UF stopped, meds given (see MAR).  UF goal was reduced within the parameters given in the order.  TX duration: 3.5 hours    Total UF removed: 700 ml, 100 albumin x2, 100 saline bolus  Hand-off given to patient's nurse.     Jaci Desanto L. Dareen Piano, RN Kidney Dialysis Unit.

## 2023-07-21 NOTE — Progress Notes (Signed)
NAME:  [name], MRN:  [id number], DOB:  [birth year], LOS: 29 ADMISSION DATE:  07/06/2023, CONSULTATION DATE: 07/06/2023 REFERRING MD: Leafy Ro - TCTS, CHIEF COMPLAINT: Postcardiac surgery  History of Present Illness:  This is a 52 year old gentleman, past medical history of anxiety depression, bipolar disease, congestive heart failure chronic diastolic heart failure kidney disease followed by renal transplant in October 07, 1998 repeat renal transplant in October 07, 2014.  Patient underwent surgery today for severe mitral regurgitation severe tricuspid regurgitation and moderate aortic stenosis.  He also has atrial fibrillation received maze procedure.  He had a complex mitral valve repair a tricuspid valve annuloplasty and aortic valve replacement followed by full right and left maze procedure.  Pertinent Medical History:   Past Medical History:  Diagnosis Date   ADHD (attention deficit hyperactivity disorder)    Anemia    Anemia, chronic disease 07/13/2011   Anxiety    Arthritis    Bipolar 2 disorder (HCC) 08/18/2011   Diagnosed in October 07, 2010.    Bipolar disorder (HCC)    Blood dyscrasia    Nephrotic Anemia   Blood transfusion    Blood transfusion without reported diagnosis    Chronic diastolic CHF (congestive heart failure) (HCC)    Complication of anesthesia    Woke up intubated gets combative  afraid to be alone    Congestive heart failure (CHF) (HCC) 07/13/2011   Onset 10/07/05.  Followed by Dr. Eden Emms.  S/p cardiac catheterization in [facility] Dr. Anne Fu..  S/p cardiac catheterization in 2013/10/07 by Nishan.  Echo 10/07/13.    Coronary artery disease    Depression    Dysrhythmia    A. Fib 2023/10/08 with DCCV in May 2024   Erectile dysfunction    ESRD on hemodialysis (HCC) 07/13/2011   Glomerulonephritis at age 63, started HD in 10/08/1995.  Deceased donor renal transplant 10-07-1998 at [facility], then kidney failed and went back on HD in 10/07/05.  L forearm AVF . s/p repeat renal transplant 10/2014 [facility].   GI bleed 11/01/2011   Rectal  bleeding with emesis/diarrhea    Heart murmur    Hemodialysis status (HCC)    HSV (herpes simplex virus) infection    Hydrocele, right 11/25/2014   Hypertension    Hypertensive emergency 01/06/2013   Hypoglycemia 01/11/2013   Influenza-like illness 12/18/2013   Mild ascending aorta dilatation (HCC)    NICM (nonischemic cardiomyopathy) (HCC)    Pericardial effusion    a. Mod by echo in 2013-10-07, similar to prior.   Peripheral polyneuropathy 08/04/2016   Pneumonia    Pulmonary edema 01/06/2013   Pulmonary hypertension associated with end stage renal disease on dialysis [facility]) 07/13/2011   Renal transplant, status post 11/25/2014   Pt with Glomerulonephritis at age 15. Deceased donor renal transplant 10/07/1998 right and left renal transplant 10/2014.  Followed by [facility] transplant team; nephrologist is Dr. Lonna Cobb.    Respiratory failure with hypoxia (HCC) 01/06/2013   Shortness of breath 12/12/2012   Sleep apnea    Swollen testicle 07/13/2011   URI (upper respiratory infection) 01/25/2012    Significant Hospital Events: Including procedures, antibiotic start and stop dates in addition to other pertinent events   8/15 - AVR, MVR, TVR with TCTS ([place]). Remains on multiple pressors (NE, Epi, vaso). 8/16 - LIJ Trialysis catheter placed for CRRT.  8/17 extubated  8/18 - remains on CVVHD, 3 pressors  8/19-8/20 improving pressor needs 8/21 agitated delirium; cortrak 8/26 CRRT restarted 8/27 TVP placed, brady overnight and pacing turned to 80 8/30 palli consult  9/1 PRBC for low coox  9/2 coox down a bit again  9/3CRRT off overnight due to access issues. Remains on vaso 0.04, epi 10, norepi 2, DBA 5. 31-Jul-2023 RHC, started iNO 9/6 repeat RHC, fistula ligated 9/9 Remains on vaso 0.04, epi . 30 midodrine TID, Temp pacer, small effusions on CXR , mild interstitial edema, left lower lobe consolidation most likely atelectasis , afebrile 9/10 intermittent vasopressor. Echo continue to show  severe RV dysfunction. 9/11 [place] cvc to fem site in prep for possible temp perm  9/12 intolerant of EN, vomiting.    Interim History / Subjective:  NPO for temp-perm pacer today if he can tolerate iHD. Remains pretty weak, frail, somnolent. Off pressors for now.  Objective:  Blood pressure (!) 95/48, pulse 82, temperature (!) 96.5 F (35.8 C), temperature source Axillary, resp. rate (!) 27, height 6\' 3"  (1.905 m), weight 78.8 kg, SpO2 94%. CVP:  [4 mmHg-59 mmHg] 56 mmHg      Intake/Output Summary (Last 24 hours) at 07/21/2023 1010 Last data filed at 07/20/2023 1900 Gross per 24 hour  Intake 555.14 ml  Output --  Net 555.14 ml   Filed Weights   07/18/23 0323 07/19/23 0339 07/20/23 0414  Weight: 75.6 kg 76.9 kg 78.8 kg   Physical Examination:  Chronically ill appearing +muscle wasting +anasarca RASS -1 Ext warm Remains pacer dependent Abd mildly distended hypoactive BS  Mild stable anemia Ongoing and worsening hypercalcemia   Assessment & Plan:  Acute resp failure w hypoxia and hypercarbia - Pulm hygiene, mobility, [place] for spO2 > 90  - BIPAP PRN  MVR TVR AS s/p bioprosthetic replacements  CHB  RV failure Cardiogenic shock, improved Pericardial effusion without tamponade  Afib s/p MAZE  Sever pHTN  -temp perm device today w EP if tolerates laying flat and tolerates HD -cardiac monitoring -optimize lytes  -off iNO   ESRD on iHD CKD s/p failed Rtxp 1999, 2015  - trial of iHD with albumin and midodrine boost  H/o HSV- con't acyclovir, PTA med Anemia, multifactorial -critical dz and chronic illness driving -expected post op ABLA now resolved  -follow CBC -aranesp  Severe kcal/protein malnutrition EN intolerance, ?ileus vs. Hypercalcemia issue Hypercalcemia- immobility probably primary driving factor -EN on hold for temp perm pacer -Procalcitonin, sensipar, and pamidronate, trend  Physical deconditioning due to prolonged critical illness + significant  chronic dz burden  -PT/OT, SLP - anticipate CIR eventually    Best Practice (right click and "Reselect all SmartList Selections" daily)   Diet/type:TF  DVT prophylaxis: heparin sq GI prophylaxis: PPI Lines: Central line and Dialysis Catheter Foley:  No  Code Status:  full code Last date of multidisciplinary goals of care discussion [Per Primary Team]  Myrla Halsted MD PCCM

## 2023-07-21 NOTE — Progress Notes (Signed)
TCTS DAILY ICU PROGRESS NOTE                   [address]            [place] [address]          [phone]   7 Days Post-Op Procedure(s) (LRB): LIGATION OF RIGHT ARTERIOVENOUS  FISTULA (Right)  Total Length of Stay:  LOS: 29 days   Subjective: Sleeping, minimally responsive.  RN setting up for HD now.   Back on vasopressin for a few hours last evening but stayed off over night.  Had several episodes of vomiting yesterday.  TF currently off for procedure today.    Objective: Vital signs in last 24 hours: Temp:  [96.5 F (35.8 C)-97.9 F (36.6 C)] 96.5 F (35.8 C) (09/13 0700) Pulse Rate:  [81-89] 82 (09/13 0819) Cardiac Rhythm: Ventricular paced (09/13 0400) Resp:  [16-39] 27 (09/13 0819) BP: (81-138)/(27-89) 124/59 (09/13 0805) SpO2:  [92 %-98 %] 95 % (09/13 0819)  Filed Weights   07/18/23 0323 07/19/23 0339 07/20/23 0414  Weight: 75.6 kg 76.9 kg 78.8 kg    Weight change:    Hemodynamic parameters for last 24 hours: CVP:  [4 mmHg-59 mmHg] 56 mmHg  Intake/Output from previous day: 09/12 0701 - 09/13 0700 In: 795.1 [I.V.:130.1; NG/GT:665] Out: -   Intake/Output this shift: No intake/output data recorded.  Current Meds: Scheduled Meds:  heparin sodium (porcine)       aspirin  81 mg Per Tube Daily   atorvastatin  10 mg Per Tube QHS   azelastine  2 spray Each Nare BID   calcitonin  4 Units/kg Intramuscular BID   Chlorhexidine Gluconate Cloth  6 each Topical Daily   cinacalcet  30 mg Oral Q supper   darbepoetin (ARANESP) injection - DIALYSIS  150 mcg Subcutaneous Q Tue-1800   escitalopram  10 mg Per Tube Daily   folic acid  1 mg Per Tube Daily   Gerhardt's butt cream   Topical Daily   guaiFENesin  15 mL Per Tube BID   heparin flush  10 Units Intracatheter Once   heparin injection (subcutaneous)  5,000 Units Subcutaneous Q12H   influenza vac split trivalent PF  0.5 mL Intramuscular Once   insulin aspart  0-9 Units Subcutaneous Q4H    lidocaine-EPINEPHrine  20 mL Other Once   midodrine  30 mg Per Tube TID   multivitamin  1 tablet Per Tube BID   mouth rinse  15 mL Mouth Rinse 4 times per day   mouth rinse  15 mL Mouth Rinse 4 times per day   pantoprazole (PROTONIX) IV  40 mg Intravenous Q12H   sodium chloride flush  3 mL Intravenous Q12H   thiamine  100 mg Oral Daily   Continuous Infusions:   prismasol BGK 4/2.5 400 mL/hr at 07/19/23 0517    prismasol BGK 4/2.5 400 mL/hr at 07/18/23 2247   sodium chloride 10 mL/hr at 07/20/23 1900   albumin human     albumin human Stopped (06/23/23 0040)   anticoagulant sodium citrate     epinephrine Stopped (07/18/23 0703)   feeding supplement (PIVOT 1.5 CAL) 70 mL/hr at 07/20/23 1900   prismasol BGK 4/2.5 1,800 mL/hr at 07/19/23 1549   vasopressin Stopped (07/20/23 1831)   PRN Meds:.heparin sodium (porcine), sodium chloride, acetaminophen, albumin human, albumin human, alteplase, anticoagulant sodium citrate, bisacodyl **OR** bisacodyl, clonazepam, dextrose, docusate, heparin, heparin, ondansetron (ZOFRAN) IV, mouth rinse, mouth rinse, oxyCODONE, phenol,  polyethylene glycol, simethicone, sodium chloride flush  General appearance: Sleeping.  Neurologic: not responsive Heart: paced rhythm, no murmur. Lungs: clear breath sounds anterior Abdomen: few bowel sounds present, no obvious tenderness.  Extremities: all warm, trace peripheral edema. Wound: small superficial  separation (1.5cm) at top of the sternotomy that is clean. Sternum is stable ant the remainder of the incision is intact and dry.   Lab Results: CBC: Recent Labs    07/19/23 0346  WBC 5.9  HGB 7.8*  HCT 26.7*  PLT 229   BMET:  Recent Labs    07/20/23 1718 07/21/23 0343  NA 130* 130*  K 5.2* 5.2*  CL 98 98  CO2 22 24  GLUCOSE 101* 110*  BUN 44* 54*  CREATININE 3.26* 3.74*  CALCIUM 14.1* 13.8*    CMET: Lab Results  Component Value Date   WBC 5.9 07/19/2023   HGB 7.8 (L) 07/19/2023   HCT 26.7 (L)  07/19/2023   PLT 229 07/19/2023   GLUCOSE 110 (H) 07/21/2023   CHOL  11/04/2007    83        ATP III CLASSIFICATION:  <200     mg/dL   Desirable  578-469  mg/dL   Borderline High  >=629    mg/dL   High   TRIG 60 52/84/1324   HDL 20 (L) 11/04/2007   LDLCALC  11/04/2007    51        Total Cholesterol/HDL:CHD Risk Coronary Heart Disease Risk Table                     Men   Women  1/2 Average Risk   3.4   3.3   ALT 17 07/18/2023   AST 45 (H) 07/18/2023   NA 130 (L) 07/21/2023   K 5.2 (H) 07/21/2023   CL 98 07/21/2023   CREATININE 3.74 (H) 07/21/2023   BUN 54 (H) 07/21/2023   CO2 24 07/21/2023   TSH 2.299 06/29/2023   INR 2.8 (H) 06/18/2023   HGBA1C 5.3 06/20/2023      PT/INR: No results for input(s): "LABPROT", "INR" in the last 72 hours. Radiology: DG Abd 1 View  Result Date: 07/20/2023 CLINICAL DATA:  [id number] Encounter for nasogastric (NG) tube placement [id number] EXAM: ABDOMEN - 1 VIEW COMPARISON:  Abdominal radiograph 07/17/23 FINDINGS: Weighted enteric tube terminates in the distal stomach. Epicardial pacing leads in place. Bibasilar atelectasis. Nonobstructive bowel gas pattern. IMPRESSION: Weighted enteric tube terminates in the distal stomach. Electronically Signed   By: Lorenza Cambridge M.D.   On: 07/20/2023 12:43     Assessment/Plan:   -POD29 complex MV repair, TV annuloplasty, bioprosthetic AVR and MAZE.  POD7 ligation of AV fistula to mitigate severe RH failure. Now off pressors and inotropes. CoOx 62.   BP supported with Midodrine.   HF team managing.   -Complete heart block- V-paced with temp internal jugular lead. No good option for permanent PM now due to vascular access, EP planning for temp-perm pacer placement today.   -Malnutrition- supported with TF via CorTrak but TF interrupted several times due to vomiting. KUB yesterday show the tip of the CorTrak in the distal stomach. Will ask CorTrak nurse to attempt advancement beyond pylorus. Consider adding Reglan.    -ESRD- Followed by nephrology. HD via tunneled catheter.   -ENDO- CBGs ~100.  -Pulm- on RA with O2 satss 96%. CXR 9/10 showing mild pulm edema. Goal for dialysis today 2L.  CXR in AM.     Cecille Amsterdam.  Hedwig Morton, PA-C 07/21/2023 8:21 AM

## 2023-07-21 NOTE — Progress Notes (Addendum)
  Pt poorly tolerated iHD this afternoon with requirement to add back pressors despite mostly keeping even.   On vaso and considering adding back epi.    He is not currently able to currently lie flat.   Tentatively plan temp perm next week pending course.   Casimiro Needle [address]" [place], [place]  07/21/2023

## 2023-07-21 NOTE — Progress Notes (Signed)
Nutrition Follow-up  DOCUMENTATION CODES:   Severe malnutrition in context of chronic illness  INTERVENTION:   Tube Feeding via Cortrak: tip moved to post pyloric position today Vital 1.5 at 20 ml/hr If tolerating, goal is Vital 1.5 at 60 ml/hr with Pro-Source TF20 60 mL QID TF at goal rate provides 2480 kcals, 177 g of protein and 1094 mL of free water  Pro-Source TF20 60 mL q 4 hours while on trickle TF , each packet provides 20 grams of protein and 80 kcals.   Recommend initiation of supplemental TPN given persistent interruptions in TF for several weeks due to N/V and pt was admitted with severe malnutrition and does not appear to have improved much nutritionally since admission, current weight is well below outpatient EDW, significant muscle wasting and subcutaneous fat loss on exam; newly documented pressure injury. Pt is extremely weak and deconditioned  If pt able to tolerate TF, recommend PEG placement eventually due to chronicity of his malnutrition, even when able to take po (currently NPO and failed FEES today). Pt would likely benefit greatly from nutrition support post discharge (at least initially) even if able to advance diet.   Continue Renal MVI BID  NUTRITION DIAGNOSIS:   Severe Malnutrition related to chronic illness as evidenced by severe muscle depletion, severe fat depletion.  Continues  GOAL:   Patient will meet greater than or equal to 90% of their needs  NOT MET  MONITOR:   PO intake, TF tolerance, Labs, Weight trends  REASON FOR ASSESSMENT:   Consult Assessment of nutrition requirement/status  ASSESSMENT:   52 y/o male with h/o glomerulonephritis (age 52) s/p renal transplant (1999) and right and left renal transplants (2015), ESRD on HD (>20 yrs), HTN, bipolar 2 disorder, PTSD, ADHD, anxiety, depression, GERD, OSA, NICM, thrombocytopenia, pulmonary hypertension, afib s/p DCCV (03/2019), CHF, severe mitral and tricuspid valve regurgitation and  moderate aortic stenosis s/p mitral valve repair, tricuspid valve annuloplasty, aortic valve replacement and full right and left maze procedure 8/16.  8/15 - AVR, MVR, TVR by Dr. Leafy Ro 8/16 - CRRT initiated 8/17 - extubated, clear liquid diet 8/19 - chest tube inserted for effusion, BiPAP overnight 8/20 - no BM, abd x-ray with nonobstructive bowel gas pattern 8/21 - Cortrak placed, TF initiated 8/22 - NPO due to mental status, TF continued 8/25 - CRRT discontinued  8/26 - CRRT restarted 8/28 - TF on hold 8/29 - TF remains on hold 8/30 - trickle TF resumed per MD, TPN recommended 8/31 - TF rate increased to 30 ml/hr 9/02 - TF rate increased to 35 ml/hr 9/03 - CRRT temporarily off due to access issues, TF rate increased to 40 ml/hr, supplemental TPN recommended, CRRT resumed 9/04 - s/p RHC with occlusion of fistula 9/04 Started on iNO 9/06 Ligation of R arm fistula with improved BP 9/11 CRRT stopped 9/12 +emesis x 2, TF held, Repeat FEES-NPO, sig dysphagia and newly noted vocal cord paralysis, abd xray with nonobstructive bowel gas pattern 9/13 Cortrak and bridle replaced; noted device related pressure injury from bridle. iHD with minimal UF due to hypotension  Pt remains extremely weak and deconditioned. Mental status remains essentially unchanged; pt appears lethargic but arousable and will answer questions.  iHD today, hypotension an issue, requiring pressors (vasopressin at 0.04) and UF held. Total UF 700 mL   Plan for temp-perm pacer.  TF remains on hold. MD would like Cortrak advancement to post pyloric position with trial of trickle TF  Cuff pressures still with diastolic pressures  in 30s and 40s. ASPEN guidelines recommend diastolic pressure 50 or greater. Question if poor gut perfusion is leading to N/V and TF intolerance. This concern has been discussed extensively with PCCM  Hypercalcemia persists-corrected calcium 15.3 (serum calcium 13.8 with albumin 2.1); nephrology is  aware and indicating likely related to immobility. Receiving sensipar, calcitonin in addition to pamidronate. Potassium and phosphorus wdl Hypercalcemia could be contributing to N/V but N/V has been going on for weeks  Labs: reviewed Meds: renal MVI BID, thiamine, folic acid    Diet Order:   Diet Order             Diet NPO time specified  Diet effective midnight                   EDUCATION NEEDS:   Not appropriate for education at this time  Skin:  Skin Assessment: Skin Integrity Issues: Skin Integrity Issues:: Stage II Stage II: sacrum Incisions: sternal  Last BM:  9/11 small amounts of type 7 stool, rectal tube has been removed  Height:   Ht Readings from Last 1 Encounters:  07-17-2023 6\' 3"  (1.905 m)    Weight:   Wt Readings from Last 1 Encounters:  07/20/23 78.8 kg    Ideal Body Weight:  89 kg  BMI:  Body mass index is 21.71 kg/m.  Estimated Nutritional Needs:   Kcal:  2400-2600 kcals  Protein:  155-190 g  Fluid:  1L plus UOP  [name] MS, RDN, LDN, CNSC Registered Dietitian 3 Clinical Nutrition RD Pager and On-Call Pager Number Located in [place]

## 2023-07-21 NOTE — Procedures (Signed)
I was present at this dialysis session. I have reviewed the session itself and made appropriate changes.   First iHD after transiton from CRRT.  Uisng LIJ TDC, 2K bath with K 5.2 this AM.  Goal UF 2 L with albumin support and liberalize blood pressure parameters.  So far he is doing well.  Worsening hypercalcemia after discontinuation of CRRT is not entirely unexpected but is rather severe.  Overnight he was started on calcitonin and received pamidronate.  Dialysis will help.  Mobilization will help as well, I know nursing is trying.  Tentative for next dialysis on 9/16 but will follow closely over the weekend.  Filed Weights   07/18/23 0323 07/19/23 0339 07/20/23 0414  Weight: 75.6 kg 76.9 kg 78.8 kg    Recent Labs  Lab 07/21/23 0343  NA 130*  K 5.2*  CL 98  CO2 24  GLUCOSE 110*  BUN 54*  CREATININE 3.74*  CALCIUM 13.8*  PHOS 4.1    Recent Labs  Lab 07/17/23 0455 07/18/23 0423 07/19/23 0346  WBC 6.1 7.2 5.9  NEUTROABS  --  3.8  --   HGB 7.3* 7.7* 7.8*  HCT 25.0* 26.6* 26.7*  MCV 110.6* 110.4* 111.7*  PLT 236 245 229    Scheduled Meds:  aspirin  81 mg Per Tube Daily   atorvastatin  10 mg Per Tube QHS   azelastine  2 spray Each Nare BID   calcitonin  4 Units/kg Intramuscular BID   Chlorhexidine Gluconate Cloth  6 each Topical Daily   cinacalcet  30 mg Oral Q supper   darbepoetin (ARANESP) injection - DIALYSIS  150 mcg Subcutaneous Q Tue-1800   escitalopram  10 mg Per Tube Daily   folic acid  1 mg Per Tube Daily   Gerhardt's butt cream   Topical Daily   guaiFENesin  15 mL Per Tube BID   heparin flush  10 Units Intracatheter Once   heparin injection (subcutaneous)  5,000 Units Subcutaneous Q12H   heparin sodium (porcine)       influenza vac split trivalent PF  0.5 mL Intramuscular Once   insulin aspart  0-9 Units Subcutaneous Q4H   lidocaine-EPINEPHrine  20 mL Other Once   midodrine  30 mg Per Tube TID   multivitamin  1 tablet Per Tube BID   mouth rinse  15 mL  Mouth Rinse 4 times per day   mouth rinse  15 mL Mouth Rinse 4 times per day   pantoprazole (PROTONIX) IV  40 mg Intravenous Q12H   sodium chloride flush  3 mL Intravenous Q12H   thiamine  100 mg Oral Daily   Continuous Infusions:   prismasol BGK 4/2.5 400 mL/hr at 07/19/23 0517    prismasol BGK 4/2.5 400 mL/hr at 07/18/23 2247   sodium chloride 10 mL/hr at 07/20/23 1900   albumin human     albumin human Stopped (06/23/23 0040)   anticoagulant sodium citrate     epinephrine Stopped (07/18/23 0703)   feeding supplement (PIVOT 1.5 CAL) 70 mL/hr at 07/20/23 1900   prismasol BGK 4/2.5 1,800 mL/hr at 07/19/23 1549   vasopressin Stopped (07/20/23 1831)   PRN Meds:.sodium chloride, acetaminophen, albumin human, albumin human, alteplase, anticoagulant sodium citrate, bisacodyl **OR** bisacodyl, clonazepam, dextrose, docusate, heparin, heparin, heparin sodium (porcine), ondansetron (ZOFRAN) IV, mouth rinse, mouth rinse, oxyCODONE, phenol, polyethylene glycol, simethicone, sodium chloride flush   Sabra Heck  MD 07/21/2023, 8:42 AM

## 2023-07-22 ENCOUNTER — Inpatient Hospital Stay (HOSPITAL_COMMUNITY): Payer: Medicare HMO

## 2023-07-22 DIAGNOSIS — Z9889 Other specified postprocedural states: Secondary | ICD-10-CM | POA: Diagnosis not present

## 2023-07-22 LAB — RENAL FUNCTION PANEL
Albumin: 2.6 g/dL — ABNORMAL LOW (ref 3.5–5.0)
Albumin: 2.6 g/dL — ABNORMAL LOW (ref 3.5–5.0)
Anion gap: 9 (ref 5–15)
Anion gap: 6 (ref 5–15)
BUN: 48 mg/dL — ABNORMAL HIGH (ref 6–20)
BUN: 49 mg/dL — ABNORMAL HIGH (ref 6–20)
CO2: 26 mmol/L (ref 22–32)
CO2: 26 mmol/L (ref 22–32)
Calcium: 12.7 mg/dL — ABNORMAL HIGH (ref 8.9–10.3)
Calcium: 10.7 mg/dL — ABNORMAL HIGH (ref 8.9–10.3)
Chloride: 94 mmol/L — ABNORMAL LOW (ref 98–111)
Chloride: 98 mmol/L (ref 98–111)
Creatinine, Ser: 3.65 mg/dL — ABNORMAL HIGH (ref 0.61–1.24)
Creatinine, Ser: 3.43 mg/dL — ABNORMAL HIGH (ref 0.61–1.24)
GFR, Estimated: 19 mL/min — ABNORMAL LOW (ref >60)
GFR, Estimated: 21 mL/min — ABNORMAL LOW (ref >60)
Glucose, Bld: 92 mg/dL (ref 70–99)
Glucose, Bld: 130 mg/dL — ABNORMAL HIGH (ref 70–99)
Phosphorus: 3.8 mg/dL (ref 2.5–4.6)
Phosphorus: 3.4 mg/dL (ref 2.5–4.6)
Potassium: 5 mmol/L (ref 3.5–5.1)
Potassium: 5.2 mmol/L — ABNORMAL HIGH (ref 3.5–5.1)
Sodium: 129 mmol/L — ABNORMAL LOW (ref 135–145)
Sodium: 130 mmol/L — ABNORMAL LOW (ref 135–145)

## 2023-07-22 LAB — BASIC METABOLIC PANEL
Anion gap: 9 (ref 5–15)
BUN: 42 mg/dL — ABNORMAL HIGH (ref 6–20)
CO2: 24 mmol/L (ref 22–32)
Calcium: 10.5 mg/dL — ABNORMAL HIGH (ref 8.9–10.3)
Chloride: 96 mmol/L — ABNORMAL LOW (ref 98–111)
Creatinine, Ser: 3.08 mg/dL — ABNORMAL HIGH (ref 0.61–1.24)
GFR, Estimated: 24 mL/min — ABNORMAL LOW (ref >60)
Glucose, Bld: 135 mg/dL — ABNORMAL HIGH (ref 70–99)
Potassium: 5 mmol/L (ref 3.5–5.1)
Sodium: 129 mmol/L — ABNORMAL LOW (ref 135–145)

## 2023-07-22 LAB — COOXEMETRY PANEL
Carboxyhemoglobin: 3.1 % — ABNORMAL HIGH (ref 0.5–1.5)
Methemoglobin: 1.2 % (ref 0.0–1.5)
O2 Saturation: 73.9 %
Total hemoglobin: 8 g/dL — ABNORMAL LOW (ref 12.0–16.0)

## 2023-07-22 LAB — HEPATITIS B SURFACE ANTIBODY, QUANTITATIVE: Hep B S AB Quant (Post): 2065 m[IU]/mL

## 2023-07-22 LAB — GLUCOSE, CAPILLARY
Glucose-Capillary: 122 mg/dL — ABNORMAL HIGH (ref 70–99)
Glucose-Capillary: 125 mg/dL — ABNORMAL HIGH (ref 70–99)
Glucose-Capillary: 127 mg/dL — ABNORMAL HIGH (ref 70–99)
Glucose-Capillary: 64 mg/dL — ABNORMAL LOW (ref 70–99)
Glucose-Capillary: 74 mg/dL (ref 70–99)
Glucose-Capillary: 83 mg/dL (ref 70–99)
Glucose-Capillary: 91 mg/dL (ref 70–99)
Glucose-Capillary: 93 mg/dL (ref 70–99)
Glucose-Capillary: 95 mg/dL (ref 70–99)
Glucose-Capillary: 96 mg/dL (ref 70–99)

## 2023-07-22 LAB — CBC
HCT: 25.1 % — ABNORMAL LOW (ref 39.0–52.0)
Hemoglobin: 7.7 g/dL — ABNORMAL LOW (ref 13.0–17.0)
MCH: 33 pg (ref 26.0–34.0)
MCHC: 30.7 g/dL (ref 30.0–36.0)
MCV: 107.7 fL — ABNORMAL HIGH (ref 80.0–100.0)
Platelets: 219 10*3/uL (ref 150–400)
RBC: 2.33 MIL/uL — ABNORMAL LOW (ref 4.22–5.81)
RDW: 19 % — ABNORMAL HIGH (ref 11.5–15.5)
WBC: 4.8 10*3/uL (ref 4.0–10.5)
nRBC: 2.3 % — ABNORMAL HIGH (ref 0.0–0.2)

## 2023-07-22 LAB — MAGNESIUM: Magnesium: 2.2 mg/dL (ref 1.7–2.4)

## 2023-07-22 MED ORDER — PRISMASOL BGK 4/2.5 32-4-2.5 MEQ/L EC SOLN: Status: DC

## 2023-07-22 MED ORDER — METOCLOPRAMIDE HCL 5 MG/ML IJ SOLN
10.0000 mg | Freq: Two times a day (BID) | INTRAMUSCULAR | Status: DC
  Administered 2023-07-22 – 2023-07-25 (×8): 10 mg via INTRAVENOUS
  Filled 2023-07-22 (×8): qty 2

## 2023-07-22 MED ORDER — PRISMASOL BGK 4/2.5 32-4-2.5 MEQ/L REPLACEMENT SOLN: Status: DC

## 2023-07-22 MED ORDER — SODIUM CHLORIDE 0.9 % FOR CRRT: INTRAVENOUS_CENTRAL | Status: DC | PRN

## 2023-07-22 MED ORDER — DEXTROSE 10 % IV SOLN: INTRAVENOUS | Status: AC

## 2023-07-22 MED ORDER — ORAL CARE MOUTH RINSE: 15.0000 mL | OROMUCOSAL | Status: DC | PRN

## 2023-07-22 MED ORDER — DEXTROSE 50 % IV SOLN
1.0000 | Freq: Once | INTRAVENOUS | Status: AC
  Administered 2023-07-22: 50 mL via INTRAVENOUS
  Filled 2023-07-22: qty 50

## 2023-07-22 MED ORDER — ORAL CARE MOUTH RINSE
15.0000 mL | OROMUCOSAL | Status: DC
  Administered 2023-07-23 – 2023-07-30 (×26): 15 mL via OROMUCOSAL

## 2023-07-22 MED ORDER — HEPARIN SODIUM (PORCINE) 1000 UNIT/ML DIALYSIS
1000.0000 [IU] | INTRAMUSCULAR | Status: DC | PRN
  Administered 2023-07-24: 4000 [IU] via INTRAVENOUS_CENTRAL
  Filled 2023-07-22 (×2): qty 4
  Filled 2023-07-22: qty 6

## 2023-07-22 NOTE — Plan of Care (Signed)
  Problem: Education: Goal: Knowledge of General Education information will improve Description: Including pain rating scale, medication(s)/side effects and non-pharmacologic comfort measures Outcome: Progressing   Problem: Health Behavior/Discharge Planning: Goal: Ability to manage health-related needs will improve Outcome: Progressing   Problem: Clinical Measurements: Goal: Ability to maintain clinical measurements within normal limits will improve Outcome: Progressing Goal: Will remain free from infection Outcome: Progressing Goal: Diagnostic test results will improve Outcome: Progressing Goal: Respiratory complications will improve Outcome: Progressing Goal: Cardiovascular complication will be avoided Outcome: Progressing   Problem: Activity: Goal: Risk for activity intolerance will decrease Outcome: Progressing   Problem: Nutrition: Goal: Adequate nutrition will be maintained Outcome: Progressing   Problem: Coping: Goal: Level of anxiety will decrease Outcome: Progressing   Problem: Elimination: Goal: Will not experience complications related to bowel motility Outcome: Progressing   Problem: Pain Managment: Goal: General experience of comfort will improve Outcome: Progressing   Problem: Safety: Goal: Ability to remain free from injury will improve Outcome: Progressing   Problem: Skin Integrity: Goal: Risk for impaired skin integrity will decrease Outcome: Progressing   Problem: Education: Goal: Knowledge of disease or condition will improve Outcome: Progressing Goal: Knowledge of the prescribed therapeutic regimen will improve Outcome: Progressing   Problem: Activity: Goal: Risk for activity intolerance will decrease Outcome: Progressing   Problem: Cardiac: Goal: Will achieve and/or maintain hemodynamic stability Outcome: Progressing   Problem: Clinical Measurements: Goal: Postoperative complications will be avoided or minimized Outcome:  Progressing   Problem: Respiratory: Goal: Respiratory status will improve Outcome: Progressing   Problem: Skin Integrity: Goal: Wound healing without signs and symptoms of infection Outcome: Progressing Goal: Risk for impaired skin integrity will decrease Outcome: Progressing   Problem: Urinary Elimination: Goal: Ability to achieve and maintain adequate renal perfusion and functioning will improve Outcome: Progressing   Problem: Education: Goal: Understanding of CV disease, CV risk reduction, and recovery process will improve Outcome: Progressing Goal: Individualized Educational Video(s) Outcome: Progressing   Problem: Activity: Goal: Ability to return to baseline activity level will improve Outcome: Progressing   Problem: Cardiovascular: Goal: Ability to achieve and maintain adequate cardiovascular perfusion will improve Outcome: Progressing Goal: Vascular access site(s) Level 0-1 will be maintained Outcome: Progressing   Problem: Health Behavior/Discharge Planning: Goal: Ability to safely manage health-related needs after discharge will improve Outcome: Progressing   Problem: Education: Goal: Ability to describe self-care measures that may prevent or decrease complications (Diabetes Survival Skills Education) will improve Outcome: Progressing Goal: Individualized Educational Video(s) Outcome: Progressing   Problem: Coping: Goal: Ability to adjust to condition or change in health will improve Outcome: Progressing   Problem: Fluid Volume: Goal: Ability to maintain a balanced intake and output will improve Outcome: Progressing   Problem: Health Behavior/Discharge Planning: Goal: Ability to identify and utilize available resources and services will improve Outcome: Progressing Goal: Ability to manage health-related needs will improve Outcome: Progressing   Problem: Metabolic: Goal: Ability to maintain appropriate glucose levels will improve Outcome:  Progressing   Problem: Nutritional: Goal: Maintenance of adequate nutrition will improve Outcome: Progressing Goal: Progress toward achieving an optimal weight will improve Outcome: Progressing   Problem: Skin Integrity: Goal: Risk for impaired skin integrity will decrease Outcome: Progressing   Problem: Tissue Perfusion: Goal: Adequacy of tissue perfusion will improve Outcome: Progressing

## 2023-07-22 NOTE — Plan of Care (Signed)
Problem: Education: Goal: Knowledge of General Education information will improve Description: Including pain rating scale, medication(s)/side effects and non-pharmacologic comfort measures Outcome: Progressing   Problem: Health Behavior/Discharge Planning: Goal: Ability to manage health-related needs will improve Outcome: Progressing   Problem: Clinical Measurements: Goal: Ability to maintain clinical measurements within normal limits will improve Outcome: Progressing Goal: Will remain free from infection Outcome: Progressing Goal: Diagnostic test results will improve Outcome: Progressing Goal: Respiratory complications will improve Outcome: Progressing Goal: Cardiovascular complication will be avoided Outcome: Progressing   Problem: Activity: Goal: Risk for activity intolerance will decrease Outcome: Progressing   Problem: Nutrition: Goal: Adequate nutrition will be maintained Outcome: Progressing   Problem: Coping: Goal: Level of anxiety will decrease Outcome: Progressing   Problem: Elimination: Goal: Will not experience complications related to bowel motility Outcome: Progressing   Problem: Pain Managment: Goal: General experience of comfort will improve Outcome: Progressing   Problem: Safety: Goal: Ability to remain free from injury will improve Outcome: Progressing   Problem: Skin Integrity: Goal: Risk for impaired skin integrity will decrease Outcome: Progressing   Problem: Education: Goal: Knowledge of disease or condition will improve Outcome: Progressing Goal: Knowledge of the prescribed therapeutic regimen will improve Outcome: Progressing   Problem: Activity: Goal: Risk for activity intolerance will decrease Outcome: Progressing   Problem: Cardiac: Goal: Will achieve and/or maintain hemodynamic stability Outcome: Progressing   Problem: Clinical Measurements: Goal: Postoperative complications will be avoided or minimized Outcome:  Progressing   Problem: Respiratory: Goal: Respiratory status will improve Outcome: Progressing   Problem: Skin Integrity: Goal: Wound healing without signs and symptoms of infection Outcome: Progressing Goal: Risk for impaired skin integrity will decrease Outcome: Progressing   Problem: Urinary Elimination: Goal: Ability to achieve and maintain adequate renal perfusion and functioning will improve Outcome: Progressing   Problem: Education: Goal: Understanding of CV disease, CV risk reduction, and recovery process will improve Outcome: Progressing Goal: Individualized Educational Video(s) Outcome: Progressing   Problem: Activity: Goal: Ability to return to baseline activity level will improve Outcome: Progressing   Problem: Cardiovascular: Goal: Ability to achieve and maintain adequate cardiovascular perfusion will improve Outcome: Progressing Goal: Vascular access site(s) Level 0-1 will be maintained Outcome: Progressing   Problem: Health Behavior/Discharge Planning: Goal: Ability to safely manage health-related needs after discharge will improve Outcome: Progressing   Problem: Education: Goal: Ability to describe self-care measures that may prevent or decrease complications (Diabetes Survival Skills Education) will improve Outcome: Progressing Goal: Individualized Educational Video(s) Outcome: Progressing   Problem: Coping: Goal: Ability to adjust to condition or change in health will improve Outcome: Progressing   Problem: Fluid Volume: Goal: Ability to maintain a balanced intake and output will improve Outcome: Progressing   Problem: Health Behavior/Discharge Planning: Goal: Ability to identify and utilize available resources and services will improve Outcome: Progressing Goal: Ability to manage health-related needs will improve Outcome: Progressing   Problem: Metabolic: Goal: Ability to maintain appropriate glucose levels will improve Outcome:  Progressing   Problem: Nutritional: Goal: Maintenance of adequate nutrition will improve Outcome: Progressing Goal: Progress toward achieving an optimal weight will improve Outcome: Progressing   Problem: Skin Integrity: Goal: Risk for impaired skin integrity will decrease Outcome: Progressing   Problem: Tissue Perfusion: Goal: Adequacy of tissue perfusion will improve Outcome: Progressing

## 2023-07-22 NOTE — Progress Notes (Signed)
[address]       [place] [address]             [phone]      8 Days Post-Op  Procedure(s) (LRB): LIGATION OF RIGHT ARTERIOVENOUS  FISTULA (Right)   Total Length of Stay:  LOS: 30 days    SUBJECTIVE: Has become more lethargic  Tube feeds have not been advanced Has required vaso for bp Vitals:   07/22/23 0730 07/22/23 0745  BP: (!) 81/34 (!) 105/30  Pulse: 82 82  Resp: (!) 27 (!) 28  Temp:    SpO2: 99% 98%    Intake/Output      09/13 0701 09/14 0700 09/14 0701 09/15 0700   I.V. (mL/kg) 601 (7.6)    NG/GT 849.7    IV Piggyback 134    Total Intake(mL/kg) 1584.7 (20.1)    Other 900    Total Output 900    Net +684.7         Stool Occurrence 2 x         prismasol BGK 4/2.5      prismasol BGK 4/2.5     sodium chloride Stopped (07/22/23 0503)   albumin human Stopped (06/23/23 0040)   dextrose 30 mL/hr at 07/22/23 0700   epinephrine Stopped (07/21/23 1629)   feeding supplement (VITAL 1.5 CAL) 20 mL/hr at 07/22/23 0700   prismasol BGK 4/2.5     vasopressin 0.02 Units/min (07/22/23 0700)    CBC    Component Value Date/Time   WBC 4.8 07/22/2023 0427   RBC 2.33 (L) 07/22/2023 0427   HGB 7.7 (L) 07/22/2023 0427   HCT 25.1 (L) 07/22/2023 0427   PLT 219 07/22/2023 0427   MCV 107.7 (H) 07/22/2023 0427   MCH 33.0 07/22/2023 0427   MCHC 30.7 07/22/2023 0427   RDW 19.0 (H) 07/22/2023 0427   LYMPHSABS 1.6 07/18/2023 0423   MONOABS 1.3 (H) 07/18/2023 0423   EOSABS 0.4 07/18/2023 0423   BASOSABS 0.1 07/18/2023 0423   CMP     Component Value Date/Time   NA 129 (L) 07/22/2023 0427   K 5.0 07/22/2023 0427   CL 94 (L) 07/22/2023 0427   CO2 26 07/22/2023 0427   GLUCOSE 92 07/22/2023 0427   BUN 48 (H) 07/22/2023 0427   CREATININE 3.65 (H) 07/22/2023 0427   CALCIUM 12.7 (H) 07/22/2023 0427   CALCIUM 9.8 02/18/2011 1007   PROT 8.9 (H) 07/18/2023 0423   ALBUMIN 2.6 (L) 07/22/2023 0427   AST 45 (H) 07/18/2023 0423   ALT 17 07/18/2023  0423   ALKPHOS 117 07/18/2023 0423   BILITOT 0.5 07/18/2023 0423   GFRNONAA 19 (L) 07/22/2023 0427   GFRAA 10 (L) 03/16/2020 0825   ABG    Component Value Date/Time   PHART 7.317 (L) 07/11/2023 1346   PCO2ART 48.2 (H) 07/11/2023 1346   PO2ART 103 07/11/2023 1346   HCO3 27.9 08/05/2023 1038   HCO3 27.6 08/01/2023 1038   TCO2 29 08/05/2023 1038   TCO2 29 07/13/2023 1038   ACIDBASEDEF 1.0 08/04/2023 1517   O2SAT 73.9 07/22/2023 0427   CBG (last 3)  Recent Labs    07/22/23 0348 07/22/23 0432 07/22/23 0747  GLUCAP 74 93 95  EXAM Lethargic but does respond to questions Lungs: rhonchorous Card: RR Ext: Warm    ASSESSMENT: SP AVR MV/TV repair MAZE Has needed yet again vaso for support of BP  Coox 74. Will need to restart CRRT due to inability  to tolerate conventionaly dialysis.  Most likely hypercarbic. Will discuss with CCM options Has not received Temp/perm wire. Has not allowed [place] to get OOB due to just temp wire needed Need to get tube feeds to goal today. Need to discuss final RHC to determine progress on pulm HTN and then discuss plan for care moving forward with that information. Have not been able to progress much after fistula closure   Eugenio Hoes, MD 07/22/2023

## 2023-07-22 NOTE — Progress Notes (Signed)
[facility] Progress Note  Subjective:  Seen in room Back on vasopressin 0.7 L UF with attempted intermittent HD yesterday with albumin support Remains lethargic, maybe more so this morning  Vitals:   07/22/23 0700 07/22/23 0715 07/22/23 0730 07/22/23 0745  BP: (!) 96/33 (!) 94/34 (!) 81/34 (!) 105/30  Pulse: 79 81 82 82  Resp: (!) 23 (!) 27 (!) 27 (!) 28  Temp:      TempSrc:      SpO2: 98% 97% 99% 98%  Weight:      Height:        Physical Exam:       General adult male in bed, weak and frail appearing Lungs clear but reduced to auscultation bilaterally  Heart S1S2 Abdomen soft nontender nondistended Extremities no edema appreciated Neuro - resting, somnolent, awakens easily Access left internal jugular tunneled dialysis catheter    OP HD: [place] triad MWF 3rd shift 3h    F250     94kg   2K/2.5 Ca bath   RUE AVF    Summary: [name] is a/an 52 y.o. 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 Underwent aortic biovalve replacement, TV repair, MV repari, bilat Maze procedures.    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    # Complete heart block - post-op, per EP/ cardiology, still with temp pacer but plans for permanent option in process pending stable status  # Possible high-output heart failure - per CHF. SP AVF ligation 9/06 by VVS. Off pressors  # ESRD - pt was too hypotensive for hemodialysis post-op.  CRRT started 8/16 and  stopped 07/19/23.  Received HD 9/13 but limited by UF and return to pressors thereafter.  Resume CRRT today.  4K bath.  UF 0 to 50 mL/h net negative.  # HD access - AVF ligated for CHF. Now s/p [facility] 07/19/23 with IR>  Working well so far   # Distributive shock - Of fpressors, remains on high dose midodrine.  As above  # pHTN - severe by  RHC --> Off NO2  # Volume - improved, wt's stable.   # Pericardial effusion - per CT surgery and CHF.  We are optimizing volume status with CRRT as above.  Assessed as moderate on last TTE   # Anemia of ESRD - transfuse as needed per primary team. Aranesp increased to 150 mcg weekly on Tuesdays    # MBD ckd -  Phos acceptable.  Calcium as below.  No binders currently as on CRRT - normally on renvela.    # Hypercalcemia -worse send after discontinuation of CRRT and receiving calcitonin and has received pamidronate.  Resuming CRRT will help.  Still likely due to immobility.   [name], MD  07/22/2023, 9:44 AM  Recent Labs  Lab 07/21/23 0824 07/21/23 1528 07/22/23 0427  HGB 7.8*  --  7.7*  ALBUMIN  --  2.6* 2.6*  CALCIUM  --  12.7* 12.7*  PHOS  --  3.1 3.8  CREATININE  --  2.49* 3.65*  K  --  4.3 5.0    Inpatient medications:  acyclovir  200 mg Per Tube BID   aspirin  81 mg Per Tube Daily   atorvastatin  10 mg Per Tube QHS  azelastine  2 spray Each Nare BID   calcitonin  4 Units/kg Intramuscular BID   Chlorhexidine Gluconate Cloth  6 each Topical Daily   cinacalcet  30 mg Oral Q supper   darbepoetin (ARANESP) injection - DIALYSIS  150 mcg Subcutaneous Q Tue-1800   escitalopram  10 mg Per Tube Daily   feeding supplement (PROSource TF20)  60 mL Per Tube Q4H   folic acid  1 mg Per Tube Daily   Gerhardt's butt cream   Topical Daily   guaiFENesin  15 mL Per Tube BID   heparin flush  10 Units Intracatheter Once   heparin injection (subcutaneous)  5,000 Units Subcutaneous Q8H   influenza vac split trivalent PF  0.5 mL Intramuscular Once   insulin aspart  0-9 Units Subcutaneous Q4H   lidocaine-EPINEPHrine  20 mL Other Once   midodrine  30 mg Per Tube TID   multivitamin  1 tablet Per Tube BID   mouth rinse  15 mL Mouth Rinse 4 times per day   mouth rinse  15 mL Mouth Rinse 4 times per day   pantoprazole (PROTONIX) IV  40 mg Intravenous Q12H   sodium chloride flush  3 mL  Intravenous Q12H   thiamine  100 mg Oral Daily     prismasol BGK 4/2.5      prismasol BGK 4/2.5     sodium chloride Stopped (07/22/23 0503)   albumin human Stopped (06/23/23 0040)   dextrose 30 mL/hr at 07/22/23 0700   epinephrine Stopped (07/21/23 1629)   feeding supplement (VITAL 1.5 CAL) 20 mL/hr at 07/22/23 0700   prismasol BGK 4/2.5     vasopressin 0.02 Units/min (07/22/23 0700)   sodium chloride, acetaminophen, albumin human, bisacodyl **OR** bisacodyl, clonazepam, dextrose, docusate, heparin, ondansetron (ZOFRAN) IV, mouth rinse, mouth rinse, oxyCODONE, phenol, polyethylene glycol, simethicone, sodium chloride, sodium chloride flush

## 2023-07-22 NOTE — Progress Notes (Signed)
Nutrition Follow-up  DOCUMENTATION CODES:   Severe malnutrition in context of chronic illness  INTERVENTION:  Continue trickle tube feeds via Cortrak (post-pyloric: -Vital 1.5 at 20 mL/hour If tolerating, goal is Vital 1.5 at 60 mL/hour with PROSource [id number] mL QID TF at goal rate provides 2480 kcal, 177 grams of protein, 1094 mL H2O  PROSourceTF20 60 mL every 4 hours while on trickle tube feeds, each packet provides 20 grams of protein and 80 kcal.  Recommend initiation of supplemental TPN given persistent interruptions in TF for several weeks due to N/V and pt was admitted with severe malnutrition and does not appear to have improved much nutritionally since admission, current weight is well below outpatient EDW, significant muscle wasting and subcutaneous fat loss on exam; newly documented pressure injury. Pt is extremely weak and deconditioned   If pt able to tolerate TF, recommend PEG placement eventually due to chronicity of his malnutrition, even when able to take po (currently NPO and failed FEES today). Pt would likely benefit greatly from nutrition support post discharge (at least initially) even if able to advance diet.    Continue Renal MVI BID  NUTRITION DIAGNOSIS:   Severe Malnutrition related to chronic illness as evidenced by severe muscle depletion, severe fat depletion.  Ongoing - addressing with interventions.  GOAL:   Patient will meet greater than or equal to 90% of their needs  Not met at this time.  MONITOR:   PO intake, TF tolerance, Labs, Weight trends  REASON FOR ASSESSMENT:   Consult Assessment of nutrition requirement/status  ASSESSMENT:   52 y/o male with h/o glomerulonephritis (age 40) s/p renal transplant (1999) and right and left renal transplants (2015), ESRD on HD (>20 yrs), HTN, bipolar 2 disorder, PTSD, ADHD, anxiety, depression, GERD, OSA, NICM, thrombocytopenia, pulmonary hypertension, afib s/p DCCV (03/2019), CHF, severe mitral and  tricuspid valve regurgitation and moderate aortic stenosis s/p mitral valve repair, tricuspid valve annuloplasty, aortic valve replacement and full right and left maze procedure 8/16.  8/15 - AVR, MVR, TVR by Dr. Leafy Ro 8/16 - CRRT initiated 8/17 - extubated, clear liquid diet 8/19 - chest tube inserted for effusion, BiPAP overnight 8/20 - no BM, abd x-ray with nonobstructive bowel gas pattern 8/21 - Cortrak placed, TF initiated 8/22 - NPO due to mental status, TF continued 8/25 - CRRT discontinued  8/26 - CRRT restarted 8/28 - TF on hold 8/29 - TF remains on hold 8/30 - trickle TF resumed per MD, TPN recommended 8/31 - TF rate increased to 30 ml/hr 9/02 - TF rate increased to 35 ml/hr 9/03 - CRRT temporarily off due to access issues, TF rate increased to 40 ml/hr, supplemental TPN recommended, CRRT resumed 9/04 - s/p RHC with occlusion of fistula 9/04 Started on iNO 9/06 Ligation of R arm fistula with improved BP 9/11 CRRT stopped 9/12 +emesis x 2, TF held, Repeat FEES-NPO, sig dysphagia and newly noted vocal cord paralysis, abd xray with nonobstructive bowel gas pattern 9/13 Cortrak and bridle replaced; noted device related pressure injury from bridle. iHD with minimal UF due to hypotension 9/14 resuming CRRT; rectal tube replaced  Per review of chart pt remains lethargic. Nephrology resuming CRRT today. Discussed with MD via secure chat. Plan is to continue tube feeds at trickle rate at this time and not advance. Had an episode of emesis today. MAP 55-61 and diastolic BP 37-51.  Admission wt 95 kg. Weights have trended down in chart. Most recent weight 78.9 kg today.  Enteral Access: 10 Fr.  Cortrak tube placed 9/13 right nare; terminates in duodenum per abdominal x-ray 9/13  Tube Feeds: Vital 1.5 at 20 mL/hour + PROSource TF20 60 mL every 4 hours  Medications reviewed and include: acyclovir, folic acid 1 mg daily, calcitonin, cinacalcet, Novolog 0-9 units Q4hrs, Reglan 10 mg  every 12 hrs IV, Rena-vite BID, pantoprazole, thiamine 100 mg daily, D10 at 30 mL/hour, epinephrine gtt at 0.5 mcg/min, vasopressin gtt at 0.04 units/min  Labs reviewed: CBG 96-122, Sodium 130, Potassium 5.2, BUN 4.9, Creatinine 3.43, Calcium 10.7 (corrects to 11.82 with albumin 2.6)  UOP: none documented  900 mL fluid removal documented yesterday  I/O: +3763.4 mL since 07/08/23  Diet Order:   Diet Order             Diet NPO time specified  Diet effective midnight                  EDUCATION NEEDS:   Not appropriate for education at this time  Skin:  Skin Assessment: Skin Integrity Issues: Skin Integrity Issues:: Stage II Stage II: sacrum Incisions: sternal  Last BM:  07/22/23 - large type 7, rectal tube replaced  Height:   Ht Readings from Last 1 Encounters:  06/10/2023 6\' 3"  (1.905 m)   Weight:   Wt Readings from Last 1 Encounters:  07/22/23 78.9 kg   Ideal Body Weight:  89 kg  BMI:  Body mass index is 21.74 kg/m.  Estimated Nutritional Needs:   Kcal:  2400-2600 kcals  Protein:  155-190 g  Fluid:  1L plus UOP  Fina Heizer Tollie Eth, MS, RD, LDN, CNSC Pager number available on Amion

## 2023-07-22 NOTE — Progress Notes (Signed)
NAME:  [name], MRN:  [id number], DOB:  08-27-1971, LOS: 30 ADMISSION DATE:  06/14/2023, CONSULTATION DATE: 06/12/2023 REFERRING MD: Leafy Ro - TCTS, CHIEF COMPLAINT: Postcardiac surgery  History of Present Illness:  This is a 52 year old gentleman, past medical history of anxiety depression, bipolar disease, congestive heart failure chronic diastolic heart failure kidney disease followed by renal transplant in Oct 05, 1998 repeat renal transplant in 05-Oct-2014.  Patient underwent surgery today for severe mitral regurgitation severe tricuspid regurgitation and moderate aortic stenosis.  He also has atrial fibrillation received maze procedure.  He had a complex mitral valve repair a tricuspid valve annuloplasty and aortic valve replacement followed by full right and left maze procedure.  Pertinent Medical History:   Past Medical History:  Diagnosis Date   ADHD (attention deficit hyperactivity disorder)    Anemia    Anemia, chronic disease 07/13/2011   Anxiety    Arthritis    Bipolar 2 disorder (HCC) 08/18/2011   Diagnosed in October 05, 2010.    Bipolar disorder (HCC)    Blood dyscrasia    Nephrotic Anemia   Blood transfusion    Blood transfusion without reported diagnosis    Chronic diastolic CHF (congestive heart failure) (HCC)    Complication of anesthesia    Woke up intubated gets combative  afraid to be alone    Congestive heart failure (CHF) (HCC) 07/13/2011   Onset Oct 05, 2005.  Followed by Dr. Eden Emms.  S/p cardiac catheterization in [facility] Dr. Anne Fu..  S/p cardiac catheterization in 10-05-13 by Nishan.  Echo October 05, 2013.    Coronary artery disease    Depression    Dysrhythmia    A. Fib October 06, 2023 with DCCV in May 2024   Erectile dysfunction    ESRD on hemodialysis (HCC) 07/13/2011   Glomerulonephritis at age 15, started HD in 1995/10/06.  Deceased donor renal transplant October 05, 1998 at [facility], then kidney failed and went back on HD in 10-05-05.  L forearm AVF . s/p repeat renal transplant 10/2014 [facility].   GI bleed 11/01/2011   Rectal  bleeding with emesis/diarrhea    Heart murmur    Hemodialysis status (HCC)    HSV (herpes simplex virus) infection    Hydrocele, right 11/25/2014   Hypertension    Hypertensive emergency 01/06/2013   Hypoglycemia 01/11/2013   Influenza-like illness 12/18/2013   Mild ascending aorta dilatation (HCC)    NICM (nonischemic cardiomyopathy) (HCC)    Pericardial effusion    a. Mod by echo in 2013-10-05, similar to prior.   Peripheral polyneuropathy 08/04/2016   Pneumonia    Pulmonary edema 01/06/2013   Pulmonary hypertension associated with end stage renal disease on dialysis [facility]) 07/13/2011   Renal transplant, status post 11/25/2014   Pt with Glomerulonephritis at age 31. Deceased donor renal transplant 1998/10/05 right and left renal transplant 10/2014.  Followed by [facility] transplant team; nephrologist is Dr. Lonna Cobb.    Respiratory failure with hypoxia (HCC) 01/06/2013   Shortness of breath 12/12/2012   Sleep apnea    Swollen testicle 07/13/2011   URI (upper respiratory infection) 01/25/2012    Significant Hospital Events: Including procedures, antibiotic start and stop dates in addition to other pertinent events   8/15 - AVR, MVR, TVR with TCTS ([place]). Remains on multiple pressors (NE, Epi, vaso). 8/16 - LIJ Trialysis catheter placed for CRRT.  8/17 extubated  8/18 - remains on CVVHD, 3 pressors  8/19-8/20 improving pressor needs 8/21 agitated delirium; cortrak 8/26 CRRT restarted 8/27 TVP placed, brady overnight and pacing turned to 80 8/30 palli consult  9/1 PRBC for low coox  9/2 coox down a bit again  9/3CRRT off overnight due to access issues. Remains on vaso 0.04, epi 10, norepi 2, DBA 5. 9/4 RHC, started iNO 9/6 repeat RHC, fistula ligated 9/9 Remains on vaso 0.04, epi . 30 midodrine TID, Temp pacer, small effusions on CXR , mild interstitial edema, left lower lobe consolidation most likely atelectasis , afebrile 9/10 intermittent vasopressor. Echo continue to show  severe RV dysfunction. 9/11 Helen cvc to fem site in prep for possible temp perm  9/12 intolerant of EN, vomiting.    Interim History / Subjective:  Bps down again Lethargic Cortrak noted to be cracked yesterday on replacement Now starting to increase TF again Temp perm cancelled yesterday due to vaso need  Objective:  Blood pressure (!) 105/30, pulse 82, temperature 97.9 F (36.6 C), temperature source Axillary, resp. rate (!) 28, height 6\' 3"  (1.905 m), weight 78.9 kg, SpO2 98%. CVP:  [8 mmHg-89 mmHg] 24 mmHg      Intake/Output Summary (Last 24 hours) at 07/22/2023 0835 Last data filed at 07/22/2023 0700 Gross per 24 hour  Intake 1454.81 ml  Output 900 ml  Net 554.81 ml   Filed Weights   07/19/23 0339 07/20/23 0414 07/22/23 0500  Weight: 76.9 kg 78.8 kg 78.9 kg   Physical Examination:  Chronically ill  Opens eyes and follows commands with enough prompting Profoundly weak Abd soft, +BS, now having frequent stools Lungs with rhonci  Ca a little better K up CBC stable  Assessment & Plan:  Acute resp failure w hypoxia and hypercarbia - Pulm hygiene, mobility, [place] for spO2 > 90  - BIPAP PRN, worse when he gets fluid up  MVR TVR AS s/p bioprosthetic replacements  CHB  RV failure Cardiogenic shock, improved a bit after fistula closure Pericardial effusion without tamponade  Afib s/p MAZE  Sever pHTN  -temp perm device today eventually with EP depending on GOC -cardiac monitoring -optimize lytes  -off iNO  -vaso for SBP 90 - Aready on midodrine 30 tid (!)  ESRD on iHD CKD s/p failed Rtxp 1999, 2015  - unfortunately back on pressors and will get CRRT, appreciate nephro help  H/o HSV- con't acyclovir, PTA med Anemia, multifactorial -critical dz and chronic illness driving -expected post op ABLA now resolved  -follow CBC -aranesp  Severe kcal/protein malnutrition EN intolerance (?cracked cortrak), Hypercalcemia- immobility probably primary driving factor -TF  to goal -Procalcitonin, sensipar, CRRT, pamidronate  Physical deconditioning due to prolonged critical illness + significant chronic dz burden  -PT/OT, SLP - anticipate CIR eventually if recovers  GOC- plan to restart CRRT and another RHC per TCTS; if not improved he is at EOL.  Would not offer TPN  Best Practice (right click and "Reselect all SmartList Selections" daily)   Diet/type:TF  DVT prophylaxis: heparin sq GI prophylaxis: PPI Lines: Central line and Dialysis Catheter Foley:  No  Code Status:  full code Last date of multidisciplinary goals of care discussion [Per Primary Team]  33 min cc time Myrla Halsted MD PCCM

## 2023-07-23 ENCOUNTER — Inpatient Hospital Stay (HOSPITAL_COMMUNITY): Payer: Medicare HMO

## 2023-07-23 DIAGNOSIS — I272 Pulmonary hypertension, unspecified: Secondary | ICD-10-CM

## 2023-07-23 DIAGNOSIS — I442 Atrioventricular block, complete: Secondary | ICD-10-CM | POA: Diagnosis not present

## 2023-07-23 DIAGNOSIS — I5023 Acute on chronic systolic (congestive) heart failure: Secondary | ICD-10-CM | POA: Diagnosis not present

## 2023-07-23 DIAGNOSIS — Z9889 Other specified postprocedural states: Secondary | ICD-10-CM | POA: Diagnosis not present

## 2023-07-23 LAB — CBC
HCT: 24.5 % — ABNORMAL LOW (ref 39.0–52.0)
Hemoglobin: 7.2 g/dL — ABNORMAL LOW (ref 13.0–17.0)
MCH: 32.7 pg (ref 26.0–34.0)
MCHC: 29.4 g/dL — ABNORMAL LOW (ref 30.0–36.0)
MCV: 111.4 fL — ABNORMAL HIGH (ref 80.0–100.0)
Platelets: 202 10*3/uL (ref 150–400)
RBC: 2.2 MIL/uL — ABNORMAL LOW (ref 4.22–5.81)
RDW: 19 % — ABNORMAL HIGH (ref 11.5–15.5)
WBC: 4.8 10*3/uL (ref 4.0–10.5)
nRBC: 0.8 % — ABNORMAL HIGH (ref 0.0–0.2)

## 2023-07-23 LAB — RENAL FUNCTION PANEL
Albumin: 2.6 g/dL — ABNORMAL LOW (ref 3.5–5.0)
Albumin: 2.6 g/dL — ABNORMAL LOW (ref 3.5–5.0)
Anion gap: 8 (ref 5–15)
Anion gap: 5 (ref 5–15)
BUN: 39 mg/dL — ABNORMAL HIGH (ref 6–20)
BUN: 34 mg/dL — ABNORMAL HIGH (ref 6–20)
CO2: 25 mmol/L (ref 22–32)
CO2: 25 mmol/L (ref 22–32)
Calcium: 10.4 mg/dL — ABNORMAL HIGH (ref 8.9–10.3)
Calcium: 9.5 mg/dL (ref 8.9–10.3)
Chloride: 98 mmol/L (ref 98–111)
Chloride: 100 mmol/L (ref 98–111)
Creatinine, Ser: 2.6 mg/dL — ABNORMAL HIGH (ref 0.61–1.24)
Creatinine, Ser: 2.22 mg/dL — ABNORMAL HIGH (ref 0.61–1.24)
GFR, Estimated: 29 mL/min — ABNORMAL LOW (ref >60)
GFR, Estimated: 35 mL/min — ABNORMAL LOW (ref >60)
Glucose, Bld: 124 mg/dL — ABNORMAL HIGH (ref 70–99)
Glucose, Bld: 127 mg/dL — ABNORMAL HIGH (ref 70–99)
Phosphorus: 2.5 mg/dL (ref 2.5–4.6)
Phosphorus: 2.3 mg/dL — ABNORMAL LOW (ref 2.5–4.6)
Potassium: 4.6 mmol/L (ref 3.5–5.1)
Potassium: 4.5 mmol/L (ref 3.5–5.1)
Sodium: 131 mmol/L — ABNORMAL LOW (ref 135–145)
Sodium: 130 mmol/L — ABNORMAL LOW (ref 135–145)

## 2023-07-23 LAB — COOXEMETRY PANEL
Carboxyhemoglobin: 2.8 % — ABNORMAL HIGH (ref 0.5–1.5)
Methemoglobin: 1.1 % (ref 0.0–1.5)
O2 Saturation: 66.8 %
Total hemoglobin: 7.7 g/dL — ABNORMAL LOW (ref 12.0–16.0)

## 2023-07-23 LAB — GLUCOSE, CAPILLARY
Glucose-Capillary: 109 mg/dL — ABNORMAL HIGH (ref 70–99)
Glucose-Capillary: 129 mg/dL — ABNORMAL HIGH (ref 70–99)
Glucose-Capillary: 126 mg/dL — ABNORMAL HIGH (ref 70–99)
Glucose-Capillary: 120 mg/dL — ABNORMAL HIGH (ref 70–99)
Glucose-Capillary: 112 mg/dL — ABNORMAL HIGH (ref 70–99)

## 2023-07-23 LAB — PREPARE RBC (CROSSMATCH)

## 2023-07-23 LAB — MAGNESIUM: Magnesium: 2.4 mg/dL (ref 1.7–2.4)

## 2023-07-23 MED ORDER — SODIUM CHLORIDE 0.9% IV SOLUTION: Freq: Once | INTRAVENOUS | Status: AC

## 2023-07-23 MED ORDER — THIAMINE MONONITRATE 100 MG PO TABS
100.0000 mg | ORAL_TABLET | Freq: Every day | ORAL | Status: DC
  Administered 2023-07-24 – 2023-07-30 (×7): 100 mg
  Filled 2023-07-23 (×7): qty 1

## 2023-07-23 MED ORDER — SODIUM CHLORIDE 0.9 % IV SOLN: INTRAVENOUS | Status: DC

## 2023-07-23 NOTE — Progress Notes (Signed)
[address]       [place] [address]             [phone]      9 Days Post-Op  Procedure(s) (LRB): LIGATION OF RIGHT ARTERIOVENOUS  FISTULA (Right)   Total Length of Stay:  LOS: 31 days    SUBJECTIVE: Much more awake CVP improved Not nauseated  Vitals:   07/23/23 0739 07/23/23 0745  BP:  (!) 102/37  Pulse:  82  Resp:  (!) 27  Temp: 98.1 F (36.7 C)   SpO2:  93%    Intake/Output      09/14 0701 09/15 0700 09/15 0701 09/16 0700   I.V. (mL/kg) 917.3 (11.7)    Other 30    NG/GT 1204.7    IV Piggyback     Total Intake(mL/kg) 2152 (27.5)    Emesis/NG output 0    Other     Stool 65    CRRT 2328    Total Output 2393    Net -241         Stool Occurrence 1 x    Emesis Occurrence 1 x         prismasol BGK 4/2.5 600 mL/hr at 07/22/23 1915    prismasol BGK 4/2.5 400 mL/hr at 07/23/23 0347   sodium chloride 10 mL/hr at 07/23/23 0700   albumin human Stopped (06/23/23 0040)   epinephrine 1 mcg/min (07/23/23 0700)   feeding supplement (VITAL 1.5 CAL) 50 mL/hr at 07/23/23 0701   prismasol BGK 4/2.5 1,500 mL/hr at 07/23/23 0724   vasopressin 0.02 Units/min (07/23/23 0700)    CBC    Component Value Date/Time   WBC 4.8 07/23/2023 0457   RBC 2.20 (L) 07/23/2023 0457   HGB 7.2 (L) 07/23/2023 0457   HCT 24.5 (L) 07/23/2023 0457   PLT 202 07/23/2023 0457   MCV 111.4 (H) 07/23/2023 0457   MCH 32.7 07/23/2023 0457   MCHC 29.4 (L) 07/23/2023 0457   RDW 19.0 (H) 07/23/2023 0457   LYMPHSABS 1.6 07/18/2023 0423   MONOABS 1.3 (H) 07/18/2023 0423   EOSABS 0.4 07/18/2023 0423   BASOSABS 0.1 07/18/2023 0423   CMP     Component Value Date/Time   NA 131 (L) 07/23/2023 0457   K 4.6 07/23/2023 0457   CL 98 07/23/2023 0457   CO2 25 07/23/2023 0457   GLUCOSE 124 (H) 07/23/2023 0457   BUN 39 (H) 07/23/2023 0457   CREATININE 2.60 (H) 07/23/2023 0457   CALCIUM 10.4 (H) 07/23/2023 0457   CALCIUM 9.8 02/18/2011 1007   PROT 8.9 (H) 07/18/2023  0423   ALBUMIN 2.6 (L) 07/23/2023 0457   AST 45 (H) 07/18/2023 0423   ALT 17 07/18/2023 0423   ALKPHOS 117 07/18/2023 0423   BILITOT 0.5 07/18/2023 0423   GFRNONAA 29 (L) 07/23/2023 0457   GFRAA 10 (L) 03/16/2020 0825   ABG    Component Value Date/Time   PHART 7.317 (L) 07/11/2023 1346   PCO2ART 48.2 (H) 07/11/2023 1346   PO2ART 103 07/11/2023 1346   HCO3 27.9 07/23/2023 1038   HCO3 27.6 07/27/2023 1038   TCO2 29 07/29/2023 1038   TCO2 29 07/29/2023 1038   ACIDBASEDEF 1.0 07/10/2023 1517   O2SAT 66.8 07/23/2023 0457   CBG (last 3)  Recent Labs    07/22/23 2352 07/23/23 0454 07/23/23 0738  GLUCAP 83 109* 129*     ASSESSMENT: SP MV/TV repair, AVR MAZE Starting to have issues with temp wire.  Will check to see if epicardial wires working Much more alert on CRRT. Discussed with AHF need to establish if PHTN improved at all since if not and cannot tolerate regular dialysis will need to discuss how to proceed moving forward with support Continue to try and push tube feeds   Eugenio Hoes, MD 07/23/2023

## 2023-07-23 NOTE — Progress Notes (Signed)
Patient ID: [name], male   DOB: Nov 17, 1970, 52 y.o.   MRN: [id number]     Advanced Heart Failure Rounding Note  PCP-Cardiologist: Reatha Harps, MD   Subjective:   8/15: Elective triple valve surgery + MAZE. Underwent bioprosthetic AVR + mitral and tricuspid valve repairs by Dr. Milinda Antis 8/22: AHF consulted for high-output HF with persistent post-op shock 8/25: CRRT stopped 8/26: CRRT restarted  18-Jul-2023: S/p TTVP Echo: EF 70-75%, severe LVH, RV mod reduced, RV severely enlarged. LA/RA mod dilated. Large pericardial effusion. No evidence of tamponade.  9/4: RHC c/w severe PAH with normal output and low SVR. PVR 4-5. SBP improved nearly 20 points with AVF occlusion.  9/6: Repeat RHC with subsequent ligation of AVF SVR ~500 despite high-dose pressors 9/9: Echo with EF 60-65%, moderate RV dysfunction/severe RV enlargement, D-shaped septum, large pericardial effusion without tamponade, stable bioprosthetic AVR, s/p MV repair with mean gradient 8, s/p TV repair.    Patient was unable to tolerate iHD, now back on epinephrine 1 and vasopressin 0.02 and getting CVVH. Able to pull 150 cc/hr net UF this morning. CVP 17 on my read.   Weak and fatigued-appearing.   Hgb down to 7.2 today.   He is RV paced via TTVP.     Objective:    Weight Range: 78.3 kg Body mass index is 21.58 kg/m.   Vital Signs:   Temp:  [97.7 F (36.5 C)-98.8 F (37.1 C)] 98.1 F (36.7 C) (09/15 0739) Pulse Rate:  [79-83] 82 (09/15 0900) Resp:  [12-30] 21 (09/15 0900) BP: (88-155)/(28-109) 99/47 (09/15 0900) SpO2:  [93 %-100 %] 95 % (09/15 0900) Weight:  [78.3 kg] 78.3 kg (09/15 0600) Last BM Date : 07/23/23  Weight change: Filed Weights   07/20/23 0414 07/22/23 0500 07/23/23 0600  Weight: 78.8 kg 78.9 kg 78.3 kg   Intake/Output:   Intake/Output Summary (Last 24 hours) at 07/23/2023 0919 Last data filed at 07/23/2023 0900 Gross per 24 hour  Intake 2209.03 ml  Output 2893 ml  Net -683.97 ml    Physical  Exam   CVP 17  General: Chronically ill-appearing.  Neck: JVP 16 cm  Lungs: Decreased at bases.  CV: Nondisplaced PMI.  Heart regular S1/S2, no S3/S4, 2/6 SEM RUSB.  No peripheral edema.   Abdomen: Soft, nontender, no hepatosplenomegaly, no distention.  Skin: Intact without lesions or rashes.  Neurologic: Alert.  Psych: Flat affect. Extremities: No clubbing or cyanosis.  HEENT: Normal.   Telemetry   V paced 80s. Personally reviewed  Labs    CBC Recent Labs    07/22/23 0427 07/23/23 0457  WBC 4.8 4.8  HGB 7.7* 7.2*  HCT 25.1* 24.5*  MCV 107.7* 111.4*  PLT 219 202   Basic Metabolic Panel Recent Labs    10/62/69 0427 07/22/23 1600 07/22/23 2006 07/23/23 0457  NA 129* 130* 129* 131*  K 5.0 5.2* 5.0 4.6  CL 94* 98 96* 98  CO2 26 26 24 25   GLUCOSE 92 130* 135* 124*  BUN 48* 49* 42* 39*  CREATININE 3.65* 3.43* 3.08* 2.60*  CALCIUM 12.7* 10.7* 10.5* 10.4*  MG 2.2  --   --  2.4  PHOS 3.8 3.4  --  2.5   Liver Function Tests Recent Labs    07/22/23 1600 07/23/23 0457  ALBUMIN 2.6* 2.6*   No results for input(s): "LIPASE", "AMYLASE" in the last 72 hours. Cardiac Enzymes No results for input(s): "CKTOTAL", "CKMB", "CKMBINDEX", "TROPONINI" in the last 72 hours.  BNP: BNP (  last 3 results) No results for input(s): "BNP" in the last 8760 hours.  ProBNP (last 3 results) No results for input(s): "PROBNP" in the last 8760 hours.  D-Dimer No results for input(s): "DDIMER" in the last 72 hours. Hemoglobin A1C No results for input(s): "HGBA1C" in the last 72 hours. Fasting Lipid Panel No results for input(s): "CHOL", "HDL", "LDLCALC", "TRIG", "CHOLHDL", "LDLDIRECT" in the last 72 hours. Thyroid Function Tests No results for input(s): "TSH", "T4TOTAL", "T3FREE", "THYROIDAB" in the last 72 hours.  Invalid input(s): "FREET3"  Other results:  Imaging   DG CHEST PORT 1 VIEW  Result Date: 07/23/2023 CLINICAL DATA:  [id number] History of open heart surgery [id number]  EXAM: PORTABLE CHEST - 1 VIEW COMPARISON:  the previous day's study FINDINGS: Left IJ dialysis catheter, feeding tube, in temporary right IJ pacing leads stable in position. Mild diffuse interstitial opacities as before with slightly improved aeration of the left lung base. Heart size is mildly enlarged as before. Aortic Atherosclerosis (ICD10-170.0). Post valve surgery with sternotomy wires. No effusion. IMPRESSION: Slightly improved aeration of the left lung base. Electronically Signed   By: Corlis Leak M.D.   On: 07/23/2023 08:48    Medications:     Scheduled Medications:  sodium chloride   Intravenous Once   acyclovir  200 mg Per Tube BID   aspirin  81 mg Per Tube Daily   atorvastatin  10 mg Per Tube QHS   azelastine  2 spray Each Nare BID   Chlorhexidine Gluconate Cloth  6 each Topical Daily   cinacalcet  30 mg Oral Q supper   darbepoetin (ARANESP) injection - DIALYSIS  150 mcg Subcutaneous Q Tue-1800   escitalopram  10 mg Per Tube Daily   feeding supplement (PROSource TF20)  60 mL Per Tube Q4H   folic acid  1 mg Per Tube Daily   Gerhardt's butt cream   Topical Daily   guaiFENesin  15 mL Per Tube BID   heparin flush  10 Units Intracatheter Once   heparin injection (subcutaneous)  5,000 Units Subcutaneous Q8H   influenza vac split trivalent PF  0.5 mL Intramuscular Once   lidocaine-EPINEPHrine  20 mL Other Once   metoCLOPramide (REGLAN) injection  10 mg Intravenous Q12H   midodrine  30 mg Per Tube TID   multivitamin  1 tablet Per Tube BID   mouth rinse  15 mL Mouth Rinse 4 times per day   pantoprazole (PROTONIX) IV  40 mg Intravenous Q12H   sodium chloride flush  3 mL Intravenous Q12H   thiamine  100 mg Oral Daily    Infusions:   prismasol BGK 4/2.5 600 mL/hr at 07/22/23 1915    prismasol BGK 4/2.5 400 mL/hr at 07/23/23 0347   sodium chloride 10 mL/hr at 07/23/23 0900   albumin human Stopped (06/23/23 0040)   epinephrine 1 mcg/min (07/23/23 0900)   feeding supplement (VITAL  1.5 CAL) 50 mL/hr at 07/23/23 0900   prismasol BGK 4/2.5 1,500 mL/hr at 07/23/23 0724   vasopressin 0.02 Units/min (07/23/23 0900)    PRN Medications: sodium chloride, acetaminophen, albumin human, bisacodyl **OR** bisacodyl, clonazepam, dextrose, docusate, heparin, ondansetron (ZOFRAN) IV, oxyCODONE, phenol, polyethylene glycol, simethicone, sodium chloride, sodium chloride flush  Patient Profile   51 y/o AAM w/ mildly reduced systolic heart failure (EF once 45-50% in 2013 but normal on subsequent echos) valvular heart disease including severe AS, 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 EF 55-60% w/ mild RV dysfunction by echo in 6/24.    Admitted for elective triple vale surgery + MAZE. S/p bioprosthetic aortic valve replacement + mitral and tricuspid valve repairs. Post-op course c/b persistent vasoplagic syndrome.   Assessment/Plan   1. Post-op Vasoplegia/high-output HF - Echo 06/29/23 EF 60-65%, no RWMA, small pericardial effusion present, MV repaired/replaced, trivial MR. TR repaired/replaced, mild TR. AV repaired/replaced.  - Echo 06/12/2023: EF 70-75%, severe concentric LVH, RV mod reduced, RV severely enlarged. LA/RA mod dilated. Large pericardial effusion. No evidence of tamponade - Echo 08/29: Moderate pericardial effusion, no tamponade - RHC c/w high output HF physiology 2/2 AV fistula despite severe RV dysfunction  - s/p AVF ligation on 9/6 - Echo (9/9) with EF 60-65%, moderate RV dysfunction/severe RV enlargement, D-shaped septum, large pericardial effusion without tamponade, stable bioprosthetic AVR, s/p MV repair with mean gradient 8, s/p TV repair.     2.  Acute on Chronic Systolic Heart Failure w/ Prominent RV Dysfunction  - LVEF once 45-50% in 2013 but normal on subsequent echos. Pre-op TTE 6/25 EF 55-60%, GIIIDD (Restrictive) RV  mildly reduced, mod PH w/ estimated RVSP 48 mmHg  - Intra-op TEE (while on 4 mcg of Epi) LVEF 45%, RV mod-severely reduced, stable AV prosthesis w/ normal gradient and stable valvuloplasty rings w/ mod residual MS, mean gradient 7 mmgH.   - echo 06/08/2023: EF 70-75%, severe concentric LVH, RV mod reduced, RV severely enlarged. LA/RA mod dilated. Large pericardial effusion. No evidence of tamponade.  - Echo 08/29: EF 65-70%, RV moderately reduced, pericardial effusion slightly smaller, no tamponade - RHC c/w high output HF physiology with very high pulmonary pressures 2/2 AV fistula despite severe RV dysfunction - s/p Fistula ligation 9/6   - Echo (9/9) with EF 60-65%, moderate RV dysfunction/severe RV enlargement, D-shaped septum, large pericardial effusion without tamponade, stable bioprosthetic AVR, s/p MV repair with mean gradient 8, s/p TV repair.   - Back on epinephrine 1 and vasopressin 0.02, unable to tolerate iHD and on CVVH again.  Also on midodrine 30 tid.  CVP 17. - Tolerating UF today at 150 cc/hr net negative.  Continue.  - Discussed with Dr. Leafy Ro, will repeat RHC tomorrow to reassess PA pressure and right-sided pressures.  Unfortunately, we are not making much progress here and have been unable to get him off CVVH onto iHD.  Will need ongoing palliative care discussions post RHC if it does not appear that progress has been made.    3. Valvular Heart Disease  - pre-op diagnosis, severe AS, severe MR 2/2 MVP, severe TR - s/p bioprosthetic AVR, mitral and tricuspid valve repairs - stable gradients on intra-op TEE post repairs - stable on echo 8/22 and 8/27 - stable on echo 9/9, mod MS mG 8.3 mmHg    4. Post-op CHB  - EP following. - S/p RIJ temp wire 08/27 - EPW left in place d/t concern for risk of tamponade - V paced 80.  Still has TTVP, if we proceed with aggressive care, he will need temporary/permanent pacemaker.    5. ESRD on HD  - s/p failed kidney transplants in 1999 and  again in 2015 - Has failed iHD so far and remains on pressors, getting CVVH.    6. H/o Atrial Fibrillation  - S/p MAZE    7. Large pericardial effusion - seen on echo 06/14/2023. Previously small on echo 06/29/23 - no evidence of tamponade -  Limited echo 08/29 stable - Echo 9/9, large pericardial effusion but no tamponade.   8. Severe PAH - pre-op PA pressures 58/30. With PVR 1.2 CO 10L  - post-op PA 97/33 (55) PCWP 30 PVR 4.2 CO 7L - suspect combination of valvular heart disease and high-output physiology - should improved with AVF ligation - Will repeat RHC tomorrow as above.   9. GOC - palliative care initially following. Now signed off 9/2 per patient request - remains full code - RHC tomorrow, if no progress will need to strongly consider restarting palliative discussions. He has been unable to come off CVVH.   CRITICAL CARE Performed by: Marca Ancona  Total critical care time: 40 minutes  Critical care time was exclusive of separately billable procedures and treating other patients.  Critical care was necessary to treat or prevent imminent or life-threatening deterioration.  Critical care was time spent personally by me on the following activities: development of treatment plan with patient and/or surrogate as well as nursing, discussions with consultants, evaluation of patient's response to treatment, examination of patient, obtaining history from patient or surrogate, ordering and performing treatments and interventions, ordering and review of laboratory studies, ordering and review of radiographic studies, pulse oximetry and re-evaluation of patient's condition.    Length of Stay: 34  Marca Ancona, MD  07/23/2023, 9:19 AM  Advanced Heart Failure Team Pager [phone] (M-F; 7a - 5p)  Please contact [facility] for night-coverage after hours (5p -7a ) and weekends on [email or web address]

## 2023-07-23 NOTE — Progress Notes (Signed)
[facility] Progress Note  Subjective:  Seen in room on CRRT Tolerating UF, 150 mL/h net negative with slight negative ins and outs yesterday K4.6, phosphorus 2.5, calcium 10.4 this morning Remains on vasopressin and epinephrine For right heart cath tomorrow  Vitals:   07/23/23 0830 07/23/23 0845 07/23/23 0900 07/23/23 0915  BP: (!) 96/49 (!) 99/38 (!) 99/47 (!) 103/40  Pulse: 81 82 82 82  Resp: (!) 25 (!) 27 (!) 21 (!) 27  Temp:      TempSrc:      SpO2: 93% 95% 95% 93%  Weight:      Height:        Physical Exam:       General adult male in bed, weak and frail appearing Lungs clear but reduced to auscultation bilaterally  Heart S1S2 Abdomen soft nontender nondistended Extremities no edema appreciated Neuro - resting, somnolent, awakens easily Access left internal jugular tunneled dialysis catheter    OP HD: [facility] triad MWF 3rd shift 3h    F250     94kg   2K/2.5 Ca bath   RUE AVF    Summary: [name] is a/an 52 y.o. 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 Underwent aortic biovalve replacement, TV repair, MV repari, bilat Maze procedures.    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    # Complete heart block - post-op, per EP/ cardiology, still with temp pacer but plans for permanent option in process pending stable status  # Possible high-output heart failure - per CHF. SP AVF ligation 9/06 by VVS. Off pressors  # ESRD - pt was too hypotensive for hemodialysis post-op.  CRRT started 8/16 and  stopped 07/19/23.  Received HD 9/13 but limited by UF and return to pressors thereafter.  Resume CRRT 9/14.  4K bath.  CVP directing UF goals with AHF assisting.  Continue for today.  # HD access - AVF ligated for CHF. Now s/p [facility] 07/19/23 with IR>  Working well   #  Distributive shock - On and off of pressors, remains on high dose midodrine.  As above  # pHTN - severe by RHC --> Off NO2  # Volume - improved, wt's stable.   # Pericardial effusion - per CT surgery and CHF.  We are optimizing volume status with CRRT as above.  Assessed as moderate on last TTE   # Anemia of ESRD - transfuse as needed per primary team. Aranesp increased to 150 mcg weekly on Tuesdays    # MBD ckd -  Phos acceptable.  Calcium as below.  No binders currently as on CRRT - normally on renvela.    # Hypercalcemia -worse send after discontinuation of CRRT and has received calcitonin and  pamidronate.  Resuming CRRT helping. still likely due to immobility.  # Progressive frailty and FTT: Recommend goals of care conversations, little tangible improvement over the past 7 days   [name], MD  07/23/2023, 9:31 AM  Recent Labs  Lab 07/22/23 0427 07/22/23 1600 07/22/23 2006 07/23/23 0457  HGB 7.7*  --   --  7.2*  ALBUMIN 2.6* 2.6*  --  2.6*  CALCIUM 12.7* 10.7* 10.5* 10.4*  PHOS 3.8 3.4  --  2.5  CREATININE 3.65* 3.43* 3.08* 2.60*  K 5.0 5.2* 5.0 4.6    Inpatient medications:  sodium chloride   Intravenous Once   acyclovir  200 mg Per Tube BID   aspirin  81 mg Per Tube Daily   atorvastatin  10 mg Per Tube QHS   azelastine  2 spray Each Nare BID   Chlorhexidine Gluconate Cloth  6 each Topical Daily   cinacalcet  30 mg Oral Q supper   darbepoetin (ARANESP) injection - DIALYSIS  150 mcg Subcutaneous Q Tue-1800   escitalopram  10 mg Per Tube Daily   feeding supplement (PROSource TF20)  60 mL Per Tube Q4H   folic acid  1 mg Per Tube Daily   Gerhardt's butt cream   Topical Daily   guaiFENesin  15 mL Per Tube BID   heparin flush  10 Units Intracatheter Once   heparin injection (subcutaneous)  5,000 Units Subcutaneous Q8H   influenza vac split trivalent PF  0.5 mL Intramuscular Once   lidocaine-EPINEPHrine  20 mL Other Once   metoCLOPramide (REGLAN) injection  10  mg Intravenous Q12H   midodrine  30 mg Per Tube TID   multivitamin  1 tablet Per Tube BID   mouth rinse  15 mL Mouth Rinse 4 times per day   pantoprazole (PROTONIX) IV  40 mg Intravenous Q12H   sodium chloride flush  3 mL Intravenous Q12H   thiamine  100 mg Oral Daily     prismasol BGK 4/2.5 600 mL/hr at 07/22/23 1915    prismasol BGK 4/2.5 400 mL/hr at 07/23/23 0347   sodium chloride 10 mL/hr at 07/23/23 0900   albumin human Stopped (06/23/23 0040)   epinephrine 1 mcg/min (07/23/23 0900)   feeding supplement (VITAL 1.5 CAL) 50 mL/hr at 07/23/23 0900   prismasol BGK 4/2.5 1,500 mL/hr at 07/23/23 0724   vasopressin 0.02 Units/min (07/23/23 0900)   sodium chloride, acetaminophen, albumin human, bisacodyl **OR** bisacodyl, clonazepam, dextrose, docusate, heparin, ondansetron (ZOFRAN) IV, oxyCODONE, phenol, polyethylene glycol, simethicone, sodium chloride, sodium chloride flush

## 2023-07-23 NOTE — H&P (View-Only) (Signed)
Patient ID: [name], male   DOB: Nov 17, 1970, 52 y.o.   MRN: [id number]     Advanced Heart Failure Rounding Note  PCP-Cardiologist: Reatha Harps, MD   Subjective:   8/15: Elective triple valve surgery + MAZE. Underwent bioprosthetic AVR + mitral and tricuspid valve repairs by Dr. Milinda Antis 8/22: AHF consulted for high-output HF with persistent post-op shock 8/25: CRRT stopped 8/26: CRRT restarted  18-Jul-2023: S/p TTVP Echo: EF 70-75%, severe LVH, RV mod reduced, RV severely enlarged. LA/RA mod dilated. Large pericardial effusion. No evidence of tamponade.  9/4: RHC c/w severe PAH with normal output and low SVR. PVR 4-5. SBP improved nearly 20 points with AVF occlusion.  9/6: Repeat RHC with subsequent ligation of AVF SVR ~500 despite high-dose pressors 9/9: Echo with EF 60-65%, moderate RV dysfunction/severe RV enlargement, D-shaped septum, large pericardial effusion without tamponade, stable bioprosthetic AVR, s/p MV repair with mean gradient 8, s/p TV repair.    Patient was unable to tolerate iHD, now back on epinephrine 1 and vasopressin 0.02 and getting CVVH. Able to pull 150 cc/hr net UF this morning. CVP 17 on my read.   Weak and fatigued-appearing.   Hgb down to 7.2 today.   He is RV paced via TTVP.     Objective:    Weight Range: 78.3 kg Body mass index is 21.58 kg/m.   Vital Signs:   Temp:  [97.7 F (36.5 C)-98.8 F (37.1 C)] 98.1 F (36.7 C) (09/15 0739) Pulse Rate:  [79-83] 82 (09/15 0900) Resp:  [12-30] 21 (09/15 0900) BP: (88-155)/(28-109) 99/47 (09/15 0900) SpO2:  [93 %-100 %] 95 % (09/15 0900) Weight:  [78.3 kg] 78.3 kg (09/15 0600) Last BM Date : 07/23/23  Weight change: Filed Weights   07/20/23 0414 07/22/23 0500 07/23/23 0600  Weight: 78.8 kg 78.9 kg 78.3 kg   Intake/Output:   Intake/Output Summary (Last 24 hours) at 07/23/2023 0919 Last data filed at 07/23/2023 0900 Gross per 24 hour  Intake 2209.03 ml  Output 2893 ml  Net -683.97 ml    Physical  Exam   CVP 17  General: Chronically ill-appearing.  Neck: JVP 16 cm  Lungs: Decreased at bases.  CV: Nondisplaced PMI.  Heart regular S1/S2, no S3/S4, 2/6 SEM RUSB.  No peripheral edema.   Abdomen: Soft, nontender, no hepatosplenomegaly, no distention.  Skin: Intact without lesions or rashes.  Neurologic: Alert.  Psych: Flat affect. Extremities: No clubbing or cyanosis.  HEENT: Normal.   Telemetry   V paced 80s. Personally reviewed  Labs    CBC Recent Labs    07/22/23 0427 07/23/23 0457  WBC 4.8 4.8  HGB 7.7* 7.2*  HCT 25.1* 24.5*  MCV 107.7* 111.4*  PLT 219 202   Basic Metabolic Panel Recent Labs    10/62/69 0427 07/22/23 1600 07/22/23 2006 07/23/23 0457  NA 129* 130* 129* 131*  K 5.0 5.2* 5.0 4.6  CL 94* 98 96* 98  CO2 26 26 24 25   GLUCOSE 92 130* 135* 124*  BUN 48* 49* 42* 39*  CREATININE 3.65* 3.43* 3.08* 2.60*  CALCIUM 12.7* 10.7* 10.5* 10.4*  MG 2.2  --   --  2.4  PHOS 3.8 3.4  --  2.5   Liver Function Tests Recent Labs    07/22/23 1600 07/23/23 0457  ALBUMIN 2.6* 2.6*   No results for input(s): "LIPASE", "AMYLASE" in the last 72 hours. Cardiac Enzymes No results for input(s): "CKTOTAL", "CKMB", "CKMBINDEX", "TROPONINI" in the last 72 hours.  BNP: BNP (  last 3 results) No results for input(s): "BNP" in the last 8760 hours.  ProBNP (last 3 results) No results for input(s): "PROBNP" in the last 8760 hours.  D-Dimer No results for input(s): "DDIMER" in the last 72 hours. Hemoglobin A1C No results for input(s): "HGBA1C" in the last 72 hours. Fasting Lipid Panel No results for input(s): "CHOL", "HDL", "LDLCALC", "TRIG", "CHOLHDL", "LDLDIRECT" in the last 72 hours. Thyroid Function Tests No results for input(s): "TSH", "T4TOTAL", "T3FREE", "THYROIDAB" in the last 72 hours.  Invalid input(s): "FREET3"  Other results:  Imaging   DG CHEST PORT 1 VIEW  Result Date: 07/23/2023 CLINICAL DATA:  [id number] History of open heart surgery [id number]  EXAM: PORTABLE CHEST - 1 VIEW COMPARISON:  the previous day's study FINDINGS: Left IJ dialysis catheter, feeding tube, in temporary right IJ pacing leads stable in position. Mild diffuse interstitial opacities as before with slightly improved aeration of the left lung base. Heart size is mildly enlarged as before. Aortic Atherosclerosis (ICD10-170.0). Post valve surgery with sternotomy wires. No effusion. IMPRESSION: Slightly improved aeration of the left lung base. Electronically Signed   By: Corlis Leak M.D.   On: 07/23/2023 08:48    Medications:     Scheduled Medications:  sodium chloride   Intravenous Once   acyclovir  200 mg Per Tube BID   aspirin  81 mg Per Tube Daily   atorvastatin  10 mg Per Tube QHS   azelastine  2 spray Each Nare BID   Chlorhexidine Gluconate Cloth  6 each Topical Daily   cinacalcet  30 mg Oral Q supper   darbepoetin (ARANESP) injection - DIALYSIS  150 mcg Subcutaneous Q Tue-1800   escitalopram  10 mg Per Tube Daily   feeding supplement (PROSource TF20)  60 mL Per Tube Q4H   folic acid  1 mg Per Tube Daily   Gerhardt's butt cream   Topical Daily   guaiFENesin  15 mL Per Tube BID   heparin flush  10 Units Intracatheter Once   heparin injection (subcutaneous)  5,000 Units Subcutaneous Q8H   influenza vac split trivalent PF  0.5 mL Intramuscular Once   lidocaine-EPINEPHrine  20 mL Other Once   metoCLOPramide (REGLAN) injection  10 mg Intravenous Q12H   midodrine  30 mg Per Tube TID   multivitamin  1 tablet Per Tube BID   mouth rinse  15 mL Mouth Rinse 4 times per day   pantoprazole (PROTONIX) IV  40 mg Intravenous Q12H   sodium chloride flush  3 mL Intravenous Q12H   thiamine  100 mg Oral Daily    Infusions:   prismasol BGK 4/2.5 600 mL/hr at 07/22/23 1915    prismasol BGK 4/2.5 400 mL/hr at 07/23/23 0347   sodium chloride 10 mL/hr at 07/23/23 0900   albumin human Stopped (06/23/23 0040)   epinephrine 1 mcg/min (07/23/23 0900)   feeding supplement (VITAL  1.5 CAL) 50 mL/hr at 07/23/23 0900   prismasol BGK 4/2.5 1,500 mL/hr at 07/23/23 0724   vasopressin 0.02 Units/min (07/23/23 0900)    PRN Medications: sodium chloride, acetaminophen, albumin human, bisacodyl **OR** bisacodyl, clonazepam, dextrose, docusate, heparin, ondansetron (ZOFRAN) IV, oxyCODONE, phenol, polyethylene glycol, simethicone, sodium chloride, sodium chloride flush  Patient Profile   51 y/o AAM w/ mildly reduced systolic heart failure (EF once 45-50% in 2013 but normal on subsequent echos) valvular heart disease including severe AS, 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 EF 55-60% w/ mild RV dysfunction by echo in 6/24.    Admitted for elective triple vale surgery + MAZE. S/p bioprosthetic aortic valve replacement + mitral and tricuspid valve repairs. Post-op course c/b persistent vasoplagic syndrome.   Assessment/Plan   1. Post-op Vasoplegia/high-output HF - Echo 06/29/23 EF 60-65%, no RWMA, small pericardial effusion present, MV repaired/replaced, trivial MR. TR repaired/replaced, mild TR. AV repaired/replaced.  - Echo 06/12/2023: EF 70-75%, severe concentric LVH, RV mod reduced, RV severely enlarged. LA/RA mod dilated. Large pericardial effusion. No evidence of tamponade - Echo 08/29: Moderate pericardial effusion, no tamponade - RHC c/w high output HF physiology 2/2 AV fistula despite severe RV dysfunction  - s/p AVF ligation on 9/6 - Echo (9/9) with EF 60-65%, moderate RV dysfunction/severe RV enlargement, D-shaped septum, large pericardial effusion without tamponade, stable bioprosthetic AVR, s/p MV repair with mean gradient 8, s/p TV repair.     2.  Acute on Chronic Systolic Heart Failure w/ Prominent RV Dysfunction  - LVEF once 45-50% in 2013 but normal on subsequent echos. Pre-op TTE 6/25 EF 55-60%, GIIIDD (Restrictive) RV  mildly reduced, mod PH w/ estimated RVSP 48 mmHg  - Intra-op TEE (while on 4 mcg of Epi) LVEF 45%, RV mod-severely reduced, stable AV prosthesis w/ normal gradient and stable valvuloplasty rings w/ mod residual MS, mean gradient 7 mmgH.   - echo 06/08/2023: EF 70-75%, severe concentric LVH, RV mod reduced, RV severely enlarged. LA/RA mod dilated. Large pericardial effusion. No evidence of tamponade.  - Echo 08/29: EF 65-70%, RV moderately reduced, pericardial effusion slightly smaller, no tamponade - RHC c/w high output HF physiology with very high pulmonary pressures 2/2 AV fistula despite severe RV dysfunction - s/p Fistula ligation 9/6   - Echo (9/9) with EF 60-65%, moderate RV dysfunction/severe RV enlargement, D-shaped septum, large pericardial effusion without tamponade, stable bioprosthetic AVR, s/p MV repair with mean gradient 8, s/p TV repair.   - Back on epinephrine 1 and vasopressin 0.02, unable to tolerate iHD and on CVVH again.  Also on midodrine 30 tid.  CVP 17. - Tolerating UF today at 150 cc/hr net negative.  Continue.  - Discussed with Dr. Leafy Ro, will repeat RHC tomorrow to reassess PA pressure and right-sided pressures.  Unfortunately, we are not making much progress here and have been unable to get him off CVVH onto iHD.  Will need ongoing palliative care discussions post RHC if it does not appear that progress has been made.    3. Valvular Heart Disease  - pre-op diagnosis, severe AS, severe MR 2/2 MVP, severe TR - s/p bioprosthetic AVR, mitral and tricuspid valve repairs - stable gradients on intra-op TEE post repairs - stable on echo 8/22 and 8/27 - stable on echo 9/9, mod MS mG 8.3 mmHg    4. Post-op CHB  - EP following. - S/p RIJ temp wire 08/27 - EPW left in place d/t concern for risk of tamponade - V paced 80.  Still has TTVP, if we proceed with aggressive care, he will need temporary/permanent pacemaker.    5. ESRD on HD  - s/p failed kidney transplants in 1999 and  again in 2015 - Has failed iHD so far and remains on pressors, getting CVVH.    6. H/o Atrial Fibrillation  - S/p MAZE    7. Large pericardial effusion - seen on echo 06/14/2023. Previously small on echo 06/29/23 - no evidence of tamponade -  Limited echo 08/29 stable - Echo 9/9, large pericardial effusion but no tamponade.   8. Severe PAH - pre-op PA pressures 58/30. With PVR 1.2 CO 10L  - post-op PA 97/33 (55) PCWP 30 PVR 4.2 CO 7L - suspect combination of valvular heart disease and high-output physiology - should improved with AVF ligation - Will repeat RHC tomorrow as above.   9. GOC - palliative care initially following. Now signed off 9/2 per patient request - remains full code - RHC tomorrow, if no progress will need to strongly consider restarting palliative discussions. He has been unable to come off CVVH.   CRITICAL CARE Performed by: Marca Ancona  Total critical care time: 40 minutes  Critical care time was exclusive of separately billable procedures and treating other patients.  Critical care was necessary to treat or prevent imminent or life-threatening deterioration.  Critical care was time spent personally by me on the following activities: development of treatment plan with patient and/or surrogate as well as nursing, discussions with consultants, evaluation of patient's response to treatment, examination of patient, obtaining history from patient or surrogate, ordering and performing treatments and interventions, ordering and review of laboratory studies, ordering and review of radiographic studies, pulse oximetry and re-evaluation of patient's condition.    Length of Stay: 34  Marca Ancona, MD  07/23/2023, 9:19 AM  Advanced Heart Failure Team Pager [phone] (M-F; 7a - 5p)  Please contact [facility] for night-coverage after hours (5p -7a ) and weekends on [email or web address]

## 2023-07-23 NOTE — Progress Notes (Signed)
NAME:  [name], MRN:  [id number], DOB:  1971-04-01, LOS: 31 ADMISSION DATE:  07/03/23, CONSULTATION DATE: 03-Jul-2023 REFERRING MD: Leafy Ro - TCTS, CHIEF COMPLAINT: Postcardiac surgery  History of Present Illness:  This is a 52 year old gentleman, past medical history of anxiety depression, bipolar disease, congestive heart failure chronic diastolic heart failure kidney disease followed by renal transplant in 10-04-98 repeat renal transplant in Oct 04, 2014.  Patient underwent surgery today for severe mitral regurgitation severe tricuspid regurgitation and moderate aortic stenosis.  He also has atrial fibrillation received maze procedure.  He had a complex mitral valve repair a tricuspid valve annuloplasty and aortic valve replacement followed by full right and left maze procedure.  Pertinent Medical History:   Past Medical History:  Diagnosis Date   ADHD (attention deficit hyperactivity disorder)    Anemia    Anemia, chronic disease 07/13/2011   Anxiety    Arthritis    Bipolar 2 disorder (HCC) 08/18/2011   Diagnosed in 2010/10/04.    Bipolar disorder (HCC)    Blood dyscrasia    Nephrotic Anemia   Blood transfusion    Blood transfusion without reported diagnosis    Chronic diastolic CHF (congestive heart failure) (HCC)    Complication of anesthesia    Woke up intubated gets combative  afraid to be alone    Congestive heart failure (CHF) (HCC) 07/13/2011   Onset 2005-10-04.  Followed by Dr. Eden Emms.  S/p cardiac catheterization in [facility] Dr. Anne Fu..  S/p cardiac catheterization in 04-Oct-2013 by Nishan.  Echo 10/04/13.    Coronary artery disease    Depression    Dysrhythmia    A. Fib 10/05/23 with DCCV in May 2024   Erectile dysfunction    ESRD on hemodialysis (HCC) 07/13/2011   Glomerulonephritis at age 65, started HD in 10-05-1995.  Deceased donor renal transplant October 04, 1998 at [facility], then kidney failed and went back on HD in 10/04/05.  L forearm AVF . s/p repeat renal transplant 10/2014 [facility].   GI bleed 11/01/2011   Rectal  bleeding with emesis/diarrhea    Heart murmur    Hemodialysis status (HCC)    HSV (herpes simplex virus) infection    Hydrocele, right 11/25/2014   Hypertension    Hypertensive emergency 01/06/2013   Hypoglycemia 01/11/2013   Influenza-like illness 12/18/2013   Mild ascending aorta dilatation (HCC)    NICM (nonischemic cardiomyopathy) (HCC)    Pericardial effusion    a. Mod by echo in 10/04/2013, similar to prior.   Peripheral polyneuropathy 08/04/2016   Pneumonia    Pulmonary edema 01/06/2013   Pulmonary hypertension associated with end stage renal disease on dialysis [facility]) 07/13/2011   Renal transplant, status post 11/25/2014   Pt with Glomerulonephritis at age 83. Deceased donor renal transplant 10/04/1998 right and left renal transplant 10/2014.  Followed by [facility] transplant team; nephrologist is Dr. Lonna Cobb.    Respiratory failure with hypoxia (HCC) 01/06/2013   Shortness of breath 12/12/2012   Sleep apnea    Swollen testicle 07/13/2011   URI (upper respiratory infection) 01/25/2012    Significant Hospital Events: Including procedures, antibiotic start and stop dates in addition to other pertinent events   07/03/2023 - AVR, MVR, TVR with TCTS ([place]). Remains on multiple pressors (NE, Epi, vaso). 8/16 - LIJ Trialysis catheter placed for CRRT.  8/17 extubated  8/18 - remains on CVVHD, 3 pressors  8/19-8/20 improving pressor needs 8/21 agitated delirium; cortrak 8/26 CRRT restarted 8/27 TVP placed, brady overnight and pacing turned to 80 8/30 palli consult  9/1 PRBC for low coox  9/2 coox down a bit again  9/3CRRT off overnight due to access issues. Remains on vaso 0.04, epi 10, norepi 2, DBA 5. 9/4 RHC, started iNO 9/6 repeat RHC, fistula ligated 9/9 Remains on vaso 0.04, epi . 30 midodrine TID, Temp pacer, small effusions on CXR , mild interstitial edema, left lower lobe consolidation most likely atelectasis , afebrile 9/10 intermittent vasopressor. Echo continue to show  severe RV dysfunction. 9/11 [place] cvc to fem site in prep for possible temp perm  9/12 intolerant of EN, vomiting.    Interim History / Subjective:  Back on CRRT but also back on epi/vaso More awake today CVP improved Tolerating TF @50  Had episode of turning head with noncapture by TVP: underlying complete heart block  Objective:  Blood pressure (!) 99/47, pulse 82, temperature 98.1 F (36.7 C), temperature source Oral, resp. rate (!) 21, height 6\' 3"  (1.905 m), weight 78.3 kg, SpO2 95%. CVP:  [9 mmHg-37 mmHg] 12 mmHg      Intake/Output Summary (Last 24 hours) at 07/23/2023 0906 Last data filed at 07/23/2023 0900 Gross per 24 hour  Intake 2209.03 ml  Output 2893 ml  Net -683.97 ml   Filed Weights   07/20/23 0414 07/22/23 0500 07/23/23 0600  Weight: 78.8 kg 78.9 kg 78.3 kg   Physical Examination:  Chronically ill appearing Ext warm Heart sounds regular, paced on monitor Abd soft, rectal tube in place Lungs diminished bases Follows commands   CXR edema stable Ca better Hgb remains borderline: to get 1 unit  Assessment & Plan:  Acute resp failure w hypoxia and hypercarbia - Pulm hygiene, mobility, [place] for spO2 > 90  - BIPAP PRN, worse mental status and resp status when he gets fluid up, better today  MVR TVR AS s/p bioprosthetic replacements  CHB  RV failure Cardiogenic shock, improved a bit after fistula closure Pericardial effusion without tamponade  Afib s/p MAZE  Sever pHTN  -temp perm device eventually with EP depending on GOC -cardiac monitoring -optimize lytes  -off iNO  - epi, vaso for SBP 90 - [place]ready on midodrine 30 tid (!)  ESRD on iHD CKD s/p failed Rtxp 1999, 2015  - unfortunately back on pressors and will get CRRT, appreciate nephro help  H/o HSV- con't acyclovir, PTA med Anemia, multifactorial -critical dz and chronic illness driving -expected post op ABLA now resolved  -follow CBC, giving a unit today (07/23/23) -aranesp  Severe  kcal/protein malnutrition EN intolerance (?cracked cortrak)- still occurred 9/14; think it happens when he gets fluid up>> relative low flow to bowel >> N/V.  Improved today s/p  Hypercalcemia- immobility probably primary driving factor - TF to goal - Procalcitonin, sensipar, CRRT, pamidronate  Physical deconditioning due to prolonged critical illness + significant chronic dz burden  - PT/OT, SLP - anticipate CIR eventually if recovers  GOC- plan for repeat RHC tomorrow and then GOC discussion as this may be EOL if despite fistula ligation RV is still nonfunctional  Best Practice (right click and "Reselect all SmartList Selections" daily)   Diet/type:TF  DVT prophylaxis: heparin sq GI prophylaxis: PPI Lines: Central line and Dialysis Catheter Foley:  No  Code Status:  full code Last date of multidisciplinary goals of care discussion [Per Primary Team]  35 min cc time Myrla Halsted MD PCCM

## 2023-07-23 NOTE — Plan of Care (Signed)
  Problem: Education: Goal: Knowledge of General Education information will improve Description: Including pain rating scale, medication(s)/side effects and non-pharmacologic comfort measures Outcome: Progressing   Problem: Health Behavior/Discharge Planning: Goal: Ability to manage health-related needs will improve Outcome: Progressing   Problem: Clinical Measurements: Goal: Ability to maintain clinical measurements within normal limits will improve Outcome: Progressing Goal: Will remain free from infection Outcome: Progressing Goal: Diagnostic test results will improve Outcome: Progressing Goal: Respiratory complications will improve Outcome: Progressing Goal: Cardiovascular complication will be avoided Outcome: Progressing   Problem: Activity: Goal: Risk for activity intolerance will decrease Outcome: Progressing   Problem: Nutrition: Goal: Adequate nutrition will be maintained Outcome: Progressing   Problem: Coping: Goal: Level of anxiety will decrease Outcome: Progressing   Problem: Elimination: Goal: Will not experience complications related to bowel motility Outcome: Progressing   Problem: Pain Managment: Goal: General experience of comfort will improve Outcome: Progressing   Problem: Safety: Goal: Ability to remain free from injury will improve Outcome: Progressing   Problem: Skin Integrity: Goal: Risk for impaired skin integrity will decrease Outcome: Progressing   Problem: Education: Goal: Knowledge of disease or condition will improve Outcome: Progressing Goal: Knowledge of the prescribed therapeutic regimen will improve Outcome: Progressing   Problem: Activity: Goal: Risk for activity intolerance will decrease Outcome: Progressing   Problem: Cardiac: Goal: Will achieve and/or maintain hemodynamic stability Outcome: Progressing   Problem: Clinical Measurements: Goal: Postoperative complications will be avoided or minimized Outcome:  Progressing   Problem: Respiratory: Goal: Respiratory status will improve Outcome: Progressing   Problem: Skin Integrity: Goal: Wound healing without signs and symptoms of infection Outcome: Progressing Goal: Risk for impaired skin integrity will decrease Outcome: Progressing   Problem: Urinary Elimination: Goal: Ability to achieve and maintain adequate renal perfusion and functioning will improve Outcome: Progressing   Problem: Education: Goal: Understanding of CV disease, CV risk reduction, and recovery process will improve Outcome: Progressing Goal: Individualized Educational Video(s) Outcome: Progressing   Problem: Activity: Goal: Ability to return to baseline activity level will improve Outcome: Progressing   Problem: Cardiovascular: Goal: Ability to achieve and maintain adequate cardiovascular perfusion will improve Outcome: Progressing Goal: Vascular access site(s) Level 0-1 will be maintained Outcome: Progressing   Problem: Health Behavior/Discharge Planning: Goal: Ability to safely manage health-related needs after discharge will improve Outcome: Progressing   Problem: Education: Goal: Ability to describe self-care measures that may prevent or decrease complications (Diabetes Survival Skills Education) will improve Outcome: Progressing Goal: Individualized Educational Video(s) Outcome: Progressing   Problem: Coping: Goal: Ability to adjust to condition or change in health will improve Outcome: Progressing   Problem: Fluid Volume: Goal: Ability to maintain a balanced intake and output will improve Outcome: Progressing   Problem: Health Behavior/Discharge Planning: Goal: Ability to identify and utilize available resources and services will improve Outcome: Progressing Goal: Ability to manage health-related needs will improve Outcome: Progressing   Problem: Metabolic: Goal: Ability to maintain appropriate glucose levels will improve Outcome:  Progressing   Problem: Nutritional: Goal: Maintenance of adequate nutrition will improve Outcome: Progressing Goal: Progress toward achieving an optimal weight will improve Outcome: Progressing   Problem: Skin Integrity: Goal: Risk for impaired skin integrity will decrease Outcome: Progressing   Problem: Tissue Perfusion: Goal: Adequacy of tissue perfusion will improve Outcome: Progressing

## 2023-07-24 ENCOUNTER — Encounter (HOSPITAL_COMMUNITY)
Admission: RE | Disposition: E | Payer: Self-pay | Source: Home / Self Care | Attending: Thoracic Surgery (Cardiothoracic Vascular Surgery)

## 2023-07-24 ENCOUNTER — Inpatient Hospital Stay (HOSPITAL_COMMUNITY): Payer: Medicare HMO

## 2023-07-24 DIAGNOSIS — I272 Pulmonary hypertension, unspecified: Secondary | ICD-10-CM | POA: Diagnosis not present

## 2023-07-24 DIAGNOSIS — I442 Atrioventricular block, complete: Secondary | ICD-10-CM | POA: Diagnosis not present

## 2023-07-24 DIAGNOSIS — Z9889 Other specified postprocedural states: Secondary | ICD-10-CM | POA: Diagnosis not present

## 2023-07-24 DIAGNOSIS — I50810 Right heart failure, unspecified: Secondary | ICD-10-CM | POA: Diagnosis not present

## 2023-07-24 DIAGNOSIS — Z954 Presence of other heart-valve replacement: Secondary | ICD-10-CM

## 2023-07-24 DIAGNOSIS — I5023 Acute on chronic systolic (congestive) heart failure: Secondary | ICD-10-CM | POA: Diagnosis not present

## 2023-07-24 HISTORY — PX: RIGHT HEART CATH: CATH118263

## 2023-07-24 LAB — POCT I-STAT EG7
Acid-Base Excess: 0 mmol/L (ref 0.0–2.0)
Acid-Base Excess: 1 mmol/L (ref 0.0–2.0)
Bicarbonate: 25.5 mmol/L (ref 20.0–28.0)
Bicarbonate: 26.8 mmol/L (ref 20.0–28.0)
Calcium, Ion: 1.3 mmol/L (ref 1.15–1.40)
Calcium, Ion: 1.32 mmol/L (ref 1.15–1.40)
HCT: 27 % — ABNORMAL LOW (ref 39.0–52.0)
HCT: 28 % — ABNORMAL LOW (ref 39.0–52.0)
Hemoglobin: 9.2 g/dL — ABNORMAL LOW (ref 13.0–17.0)
Hemoglobin: 9.5 g/dL — ABNORMAL LOW (ref 13.0–17.0)
O2 Saturation: 56 %
O2 Saturation: 54 %
Potassium: 4.8 mmol/L (ref 3.5–5.1)
Potassium: 4.9 mmol/L (ref 3.5–5.1)
Sodium: 137 mmol/L (ref 135–145)
Sodium: 136 mmol/L (ref 135–145)
TCO2: 27 mmol/L (ref 22–32)
TCO2: 28 mmol/L (ref 22–32)
pCO2, Ven: 45 mmHg (ref 44–60)
pCO2, Ven: 47.3 mmHg (ref 44–60)
pH, Ven: 7.362 (ref 7.25–7.43)
pH, Ven: 7.362 (ref 7.25–7.43)
pO2, Ven: 31 mmHg — CL (ref 32–45)
pO2, Ven: 30 mmHg — CL (ref 32–45)

## 2023-07-24 LAB — CBC
HCT: 26.4 % — ABNORMAL LOW (ref 39.0–52.0)
Hemoglobin: 7.9 g/dL — ABNORMAL LOW (ref 13.0–17.0)
MCH: 31.3 pg (ref 26.0–34.0)
MCHC: 29.9 g/dL — ABNORMAL LOW (ref 30.0–36.0)
MCV: 104.8 fL — ABNORMAL HIGH (ref 80.0–100.0)
Platelets: 215 10*3/uL (ref 150–400)
RBC: 2.52 MIL/uL — ABNORMAL LOW (ref 4.22–5.81)
RDW: 20.7 % — ABNORMAL HIGH (ref 11.5–15.5)
WBC: 5.9 10*3/uL (ref 4.0–10.5)
nRBC: 0.7 % — ABNORMAL HIGH (ref 0.0–0.2)

## 2023-07-24 LAB — RENAL FUNCTION PANEL
Albumin: 2.5 g/dL — ABNORMAL LOW (ref 3.5–5.0)
Albumin: 2.5 g/dL — ABNORMAL LOW (ref 3.5–5.0)
Anion gap: 6 (ref 5–15)
Anion gap: 8 (ref 5–15)
BUN: 33 mg/dL — ABNORMAL HIGH (ref 6–20)
BUN: 34 mg/dL — ABNORMAL HIGH (ref 6–20)
CO2: 22 mmol/L (ref 22–32)
CO2: 23 mmol/L (ref 22–32)
Calcium: 9.1 mg/dL (ref 8.9–10.3)
Calcium: 8.9 mg/dL (ref 8.9–10.3)
Chloride: 103 mmol/L (ref 98–111)
Chloride: 100 mmol/L (ref 98–111)
Creatinine, Ser: 2.06 mg/dL — ABNORMAL HIGH (ref 0.61–1.24)
Creatinine, Ser: 2.09 mg/dL — ABNORMAL HIGH (ref 0.61–1.24)
GFR, Estimated: 39 mL/min — ABNORMAL LOW (ref >60)
GFR, Estimated: 38 mL/min — ABNORMAL LOW (ref >60)
Glucose, Bld: 84 mg/dL (ref 70–99)
Glucose, Bld: 119 mg/dL — ABNORMAL HIGH (ref 70–99)
Phosphorus: 1.9 mg/dL — ABNORMAL LOW (ref 2.5–4.6)
Phosphorus: 3.3 mg/dL (ref 2.5–4.6)
Potassium: 4.3 mmol/L (ref 3.5–5.1)
Potassium: 4.5 mmol/L (ref 3.5–5.1)
Sodium: 131 mmol/L — ABNORMAL LOW (ref 135–145)
Sodium: 131 mmol/L — ABNORMAL LOW (ref 135–145)

## 2023-07-24 LAB — ECHOCARDIOGRAM COMPLETE
AR max vel: 1.76 cm2
AV Area VTI: 1.94 cm2
AV Area mean vel: 1.82 cm2
AV Mean grad: 10 mmHg
AV Peak grad: 18.5 mmHg
Ao pk vel: 2.15 m/s
Height: 75 in
MV VTI: 1.22 cm2
S' Lateral: 2.5 cm
Weight: 2610.25 [oz_av]

## 2023-07-24 LAB — GLUCOSE, CAPILLARY
Glucose-Capillary: 103 mg/dL — ABNORMAL HIGH (ref 70–99)
Glucose-Capillary: 119 mg/dL — ABNORMAL HIGH (ref 70–99)
Glucose-Capillary: 124 mg/dL — ABNORMAL HIGH (ref 70–99)
Glucose-Capillary: 133 mg/dL — ABNORMAL HIGH (ref 70–99)
Glucose-Capillary: 73 mg/dL (ref 70–99)
Glucose-Capillary: 80 mg/dL (ref 70–99)
Glucose-Capillary: 86 mg/dL (ref 70–99)
Glucose-Capillary: 87 mg/dL (ref 70–99)

## 2023-07-24 LAB — TYPE AND SCREEN
ABO/RH(D): A POS
Antibody Screen: NEGATIVE
Unit division: 0

## 2023-07-24 LAB — BPAM RBC
Blood Product Expiration Date: 202410062359
ISSUE DATE / TIME: 202409150957
Unit Type and Rh: 6200

## 2023-07-24 LAB — COOXEMETRY PANEL
Carboxyhemoglobin: 3.5 % — ABNORMAL HIGH (ref 0.5–1.5)
Methemoglobin: 1.4 % (ref 0.0–1.5)
O2 Saturation: 72.7 %
Total hemoglobin: 8.6 g/dL — ABNORMAL LOW (ref 12.0–16.0)

## 2023-07-24 LAB — PTH, INTACT AND CALCIUM
Calcium, Total (PTH): 10.4 mg/dL — ABNORMAL HIGH (ref 8.7–10.2)
PTH: 273 pg/mL — ABNORMAL HIGH (ref 15–65)

## 2023-07-24 LAB — MAGNESIUM: Magnesium: 2.4 mg/dL (ref 1.7–2.4)

## 2023-07-24 SURGERY — RIGHT HEART CATH
Anesthesia: LOCAL

## 2023-07-24 MED ORDER — FENTANYL CITRATE (PF) 100 MCG/2ML IJ SOLN
INTRAMUSCULAR | Status: AC
  Filled 2023-07-24: qty 2

## 2023-07-24 MED ORDER — POTASSIUM PHOSPHATES 15 MMOLE/5ML IV SOLN
30.0000 mmol | Freq: Once | INTRAVENOUS | Status: AC
  Administered 2023-07-24: 30 mmol via INTRAVENOUS
  Filled 2023-07-24: qty 10

## 2023-07-24 MED ORDER — ONDANSETRON HCL 4 MG/2ML IJ SOLN
4.0000 mg | Freq: Four times a day (QID) | INTRAMUSCULAR | Status: DC | PRN
  Administered 2023-07-24 – 2023-07-30 (×5): 4 mg via INTRAVENOUS
  Filled 2023-07-24 (×5): qty 2

## 2023-07-24 MED ORDER — HYDRALAZINE HCL 20 MG/ML IJ SOLN: 10.0000 mg | INTRAMUSCULAR | Status: AC | PRN

## 2023-07-24 MED ORDER — LABETALOL HCL 5 MG/ML IV SOLN: 10.0000 mg | INTRAVENOUS | Status: AC | PRN

## 2023-07-24 MED ORDER — SODIUM CHLORIDE 0.9% FLUSH: 3.0000 mL | INTRAVENOUS | Status: DC | PRN

## 2023-07-24 MED ORDER — SODIUM CHLORIDE 0.9% FLUSH
3.0000 mL | Freq: Two times a day (BID) | INTRAVENOUS | Status: DC
  Administered 2023-07-24 – 2023-07-30 (×10): 3 mL via INTRAVENOUS

## 2023-07-24 MED ORDER — MIDAZOLAM HCL 2 MG/2ML IJ SOLN
INTRAMUSCULAR | Status: DC | PRN
  Administered 2023-07-24: .5 mg via INTRAVENOUS

## 2023-07-24 MED ORDER — FENTANYL CITRATE (PF) 100 MCG/2ML IJ SOLN
INTRAMUSCULAR | Status: DC | PRN
  Administered 2023-07-24: 25 ug via INTRAVENOUS

## 2023-07-24 MED ORDER — HEPARIN SODIUM (PORCINE) 5000 UNIT/ML IJ SOLN
5000.0000 [IU] | Freq: Three times a day (TID) | INTRAMUSCULAR | Status: DC
  Administered 2023-07-24 – 2023-07-30 (×17): 5000 [IU] via SUBCUTANEOUS
  Filled 2023-07-24 (×16): qty 1

## 2023-07-24 MED ORDER — LIDOCAINE HCL (PF) 1 % IJ SOLN
INTRAMUSCULAR | Status: DC | PRN
  Administered 2023-07-24: 12 mL

## 2023-07-24 MED ORDER — MIDAZOLAM HCL 2 MG/2ML IJ SOLN
INTRAMUSCULAR | Status: AC
  Filled 2023-07-24: qty 2

## 2023-07-24 MED ORDER — HEPARIN SODIUM (PORCINE) 1000 UNIT/ML IJ SOLN
INTRAMUSCULAR | Status: AC
  Filled 2023-07-24: qty 10

## 2023-07-24 MED ORDER — VERAPAMIL HCL 2.5 MG/ML IV SOLN
INTRAVENOUS | Status: AC
  Filled 2023-07-24: qty 2

## 2023-07-24 MED ORDER — ACETAMINOPHEN 325 MG PO TABS: 650.0000 mg | ORAL_TABLET | ORAL | Status: DC | PRN

## 2023-07-24 MED ORDER — LIDOCAINE HCL (PF) 1 % IJ SOLN
INTRAMUSCULAR | Status: AC
  Filled 2023-07-24: qty 30

## 2023-07-24 MED ORDER — SODIUM CHLORIDE 0.9 % IV SOLN: 250.0000 mL | INTRAVENOUS | Status: DC | PRN

## 2023-07-24 MED ORDER — ACETAMINOPHEN 160 MG/5ML PO SOLN
650.0000 mg | Freq: Four times a day (QID) | ORAL | Status: DC | PRN
  Administered 2023-07-24 – 2023-07-30 (×9): 650 mg
  Filled 2023-07-24 (×9): qty 20.3

## 2023-07-24 MED ORDER — HEPARIN (PORCINE) IN NACL 1000-0.9 UT/500ML-% IV SOLN
INTRAVENOUS | Status: DC | PRN
  Administered 2023-07-24: 500 mL

## 2023-07-24 SURGICAL SUPPLY — 8 items
CATH SWAN GANZ 7F STRAIGHT (CATHETERS) IMPLANT
GLIDESHEATH SLENDER 7FR .021G (SHEATH) IMPLANT
KIT MICROPUNCTURE NIT STIFF (SHEATH) IMPLANT
PACK CARDIAC CATHETERIZATION (CUSTOM PROCEDURE TRAY) ×2 IMPLANT
SHEATH PINNACLE 7F 10CM (SHEATH) IMPLANT
SHEATH PROBE COVER 6X72 (BAG) IMPLANT
TRANSDUCER W/STOPCOCK (MISCELLANEOUS) IMPLANT
TUBING ART PRESS 72 MALE/FEM (TUBING) IMPLANT

## 2023-07-24 NOTE — Progress Notes (Signed)
[facility] Progress Note  Subjective:  Seen in room on CRRT For right heart catheterization today -3.5 L yesterday UF rate lowered some hypotension, remains on low-dose epinephrine and vasopressin Calcium has normalized Low phosphorus this morning, repletion ordered 0.3  Vitals:   07/15/2023 0645 07/17/2023 0655 07/28/2023 0700 07/10/2023 0800  BP: (!) 86/37 (!) 94/40 (!) 86/50 (!) 99/45  Pulse: 82 82 82 82  Resp: (!) 23 (!) 21 (!) 24 (!) 25  Temp:   (!) 97.4 F (36.3 C)   TempSrc:      SpO2: 94% 95% 96% 91%  Weight:      Height:        Physical Exam:       General adult male in bed, weak and frail appearing Lungs clear but reduced to auscultation bilaterally  Heart S1S2 Abdomen soft nontender nondistended Extremities no edema appreciated Neuro - resting, somnolent, awakens easily Access left internal jugular tunneled dialysis catheter    OP HD: [facility] triad MWF 3rd shift 3h    F250     94kg   2K/2.5 Ca bath   RUE AVF    Summary: [name] is a/an 52 y.o. 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 Underwent aortic biovalve replacement, TV repair, MV repari, bilat Maze procedures.    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    # Complete heart block - post-op, per EP/ cardiology, still with temp pacer but plans for permanent option in process pending stable status  # Possible high-output heart failure - per CHF. SP AVF ligation Jul 27, 2023 by [facility].  Right heart catheterization today  # ESRD - pt was too hypotensive for hemodialysis post-op.  CRRT started 8/16 and  stopped 07/19/23.  Received HD 9/13 but limited by UF and return to pressors thereafter.  Resume CRRT 9/14.  4K bath.  CVP directing UF goals with AHF assisting.  Continue for today.  # HD access - AVF ligated for CHF. Now  s/p [facility] 07/19/23 with IR>  Working well   # Distributive shock - On and off of pressors, remains on high dose midodrine.  As above  # pHTN - severe by RHC --> Off NO2; repeat today  # Volume - improved, wt's stable.   # Pericardial effusion - per CT surgery and CHF.  We are optimizing volume status with CRRT as above.  Assessed as moderate on last TTE   # Anemia of ESRD - transfuse as needed per primary team. Aranesp increased to 150 mcg weekly on Tuesdays    # MBD ckd -  Phos acceptable.  Calcium as below.  No binders currently as on CRRT - normally on renvela.    # Hypercalcemia -worse send after discontinuation of CRRT and has received calcitonin and  pamidronate.  Resuming CRRT helping. still likely due to immobility. Improved  # Progressive frailty and FTT: Recommend goals of care conversations, little tangible improvement over the past 7 days   [name], MD  07/22/2023, 9:28 AM  Recent Labs  Lab 07/23/23 0457 07/23/23 1559 07/18/2023 0355  HGB 7.2*  --  7.9*  ALBUMIN 2.6* 2.6* 2.5*  CALCIUM 10.4* 9.5 9.1  PHOS 2.5 2.3* 1.9*  CREATININE 2.60* 2.22* 2.06*  K 4.6 4.5 4.3  Inpatient medications:  [MAR Hold] acyclovir  200 mg Per Tube BID   [MAR Hold] aspirin  81 mg Per Tube Daily   [MAR Hold] atorvastatin  10 mg Per Tube QHS   [MAR Hold] azelastine  2 spray Each Nare BID   [MAR Hold] Chlorhexidine Gluconate Cloth  6 each Topical Daily   [MAR Hold] darbepoetin (ARANESP) injection - DIALYSIS  150 mcg Subcutaneous Q Tue-1800   [MAR Hold] escitalopram  10 mg Per Tube Daily   [MAR Hold] feeding supplement (PROSource TF20)  60 mL Per Tube Q4H   [MAR Hold] folic acid  1 mg Per Tube Daily   [MAR Hold] Gerhardt's butt cream   Topical Daily   [MAR Hold] guaiFENesin  15 mL Per Tube BID   [MAR Hold] heparin flush  10 Units Intracatheter Once   [MAR Hold] heparin injection (subcutaneous)  5,000 Units Subcutaneous Q8H   [MAR Hold] influenza vac split trivalent PF  0.5 mL  Intramuscular Once   [MAR Hold] lidocaine-EPINEPHrine  20 mL Other Once   [MAR Hold] metoCLOPramide (REGLAN) injection  10 mg Intravenous Q12H   [MAR Hold] midodrine  30 mg Per Tube TID   [MAR Hold] multivitamin  1 tablet Per Tube BID   [MAR Hold] mouth rinse  15 mL Mouth Rinse 4 times per day   [MAR Hold] pantoprazole (PROTONIX) IV  40 mg Intravenous Q12H   [MAR Hold] sodium chloride flush  3 mL Intravenous Q12H   [MAR Hold] thiamine  100 mg Per Tube Daily     prismasol BGK 4/2.5 600 mL/hr at 07/23/23 2121    prismasol BGK 4/2.5 400 mL/hr at 07/23/23 0347   [MAR Hold] sodium chloride 10 mL/hr at 08/04/2023 0800   sodium chloride 10 mL/hr at 08/06/2023 0800   [MAR Hold] albumin human Stopped (06/23/23 0040)   epinephrine 1 mcg/min (07/25/2023 0800)   feeding supplement (VITAL 1.5 CAL) Stopped (07/21/2023 0016)   [MAR Hold] potassium PHOSPHATE IVPB (in mmol)     prismasol BGK 4/2.5 1,500 mL/hr at 07/11/2023 0039   vasopressin 0.02 Units/min (08/05/2023 0800)   [MAR Hold] sodium chloride, [MAR Hold] acetaminophen, [MAR Hold] albumin human, [MAR Hold] bisacodyl **OR** [MAR Hold] bisacodyl, [MAR Hold] clonazepam, [MAR Hold] dextrose, [MAR Hold] docusate, [MAR Hold] heparin, [MAR Hold] ondansetron (ZOFRAN) IV, [MAR Hold] oxyCODONE, [MAR Hold] phenol, [MAR Hold] polyethylene glycol, [MAR Hold] simethicone, [MAR Hold] sodium chloride, [MAR Hold] sodium chloride flush

## 2023-07-24 NOTE — Progress Notes (Signed)
NAME:  [name], MRN:  [id number], DOB:  1971/08/18, LOS: 32 ADMISSION DATE:  06/10/2023, CONSULTATION DATE: 07/07/2023 REFERRING MD: Leafy Ro - TCTS, CHIEF COMPLAINT: Postcardiac surgery  History of Present Illness:  This is a 52 year old gentleman, past medical history of anxiety depression, bipolar disease, congestive heart failure chronic diastolic heart failure kidney disease followed by renal transplant in 1998-10-11 repeat renal transplant in 10-11-2014.  Patient underwent surgery today for severe mitral regurgitation severe tricuspid regurgitation and moderate aortic stenosis.  He also has atrial fibrillation received maze procedure.  He had a complex mitral valve repair a tricuspid valve annuloplasty and aortic valve replacement followed by full right and left maze procedure.  Pertinent Medical History:   Past Medical History:  Diagnosis Date   ADHD (attention deficit hyperactivity disorder)    Anemia    Anemia, chronic disease 07/13/2011   Anxiety    Arthritis    Bipolar 2 disorder (HCC) 08/18/2011   Diagnosed in 10-11-10.    Bipolar disorder (HCC)    Blood dyscrasia    Nephrotic Anemia   Blood transfusion    Blood transfusion without reported diagnosis    Chronic diastolic CHF (congestive heart failure) (HCC)    Complication of anesthesia    Woke up intubated gets combative  afraid to be alone    Congestive heart failure (CHF) (HCC) 07/13/2011   Onset 2005-10-11.  Followed by Dr. Eden Emms.  S/p cardiac catheterization in [facility] Dr. Anne Fu..  S/p cardiac catheterization in October 11, 2013 by Nishan.  Echo 10/11/13.    Coronary artery disease    Depression    Dysrhythmia    A. Fib 12-Oct-2023 with DCCV in May 2024   Erectile dysfunction    ESRD on hemodialysis (HCC) 07/13/2011   Glomerulonephritis at age 45, started HD in 1995/10/12.  Deceased donor renal transplant 10/11/98 at [facility], then kidney failed and went back on HD in October 11, 2005.  L forearm AVF . s/p repeat renal transplant 10/2014 [facility].   GI bleed 11/01/2011   Rectal  bleeding with emesis/diarrhea    Heart murmur    Hemodialysis status (HCC)    HSV (herpes simplex virus) infection    Hydrocele, right 11/25/2014   Hypertension    Hypertensive emergency 01/06/2013   Hypoglycemia 01/11/2013   Influenza-like illness 12/18/2013   Mild ascending aorta dilatation (HCC)    NICM (nonischemic cardiomyopathy) (HCC)    Pericardial effusion    a. Mod by echo in October 11, 2013, similar to prior.   Peripheral polyneuropathy 08/04/2016   Pneumonia    Pulmonary edema 01/06/2013   Pulmonary hypertension associated with end stage renal disease on dialysis [facility]) 07/13/2011   Renal transplant, status post 11/25/2014   Pt with Glomerulonephritis at age 31. Deceased donor renal transplant 10-11-98 right and left renal transplant 10/2014.  Followed by [facility] transplant team; nephrologist is Dr. Lonna Cobb.    Respiratory failure with hypoxia (HCC) 01/06/2013   Shortness of breath 12/12/2012   Sleep apnea    Swollen testicle 07/13/2011   URI (upper respiratory infection) 01/25/2012    Significant Hospital Events: Including procedures, antibiotic start and stop dates in addition to other pertinent events   8/15 - AVR, MVR, TVR with TCTS ([facility]). Remains on multiple pressors (NE, Epi, vaso). 8/16 - LIJ Trialysis catheter placed for CRRT.  8/17 extubated  8/18 - remains on CVVHD, 3 pressors  8/19-8/20 improving pressor needs 8/21 agitated delirium; cortrak 8/26 CRRT restarted 07/22/2023 TVP placed, brady overnight and pacing turned to 80 8/30 palli consult  9/1 PRBC for low coox  9/2 coox down a bit again  9/3CRRT off overnight due to access issues. Remains on vaso 0.04, epi 10, norepi 2, DBA 5. 9/4 RHC, started iNO 9/6 repeat RHC, fistula ligated 9/9 Remains on vaso 0.04, epi . 30 midodrine TID, Temp pacer, small effusions on CXR , mild interstitial edema, left lower lobe consolidation most likely atelectasis , afebrile 9/10 intermittent vasopressor. Echo continue to show  severe RV dysfunction. 9/11 Boyds cvc to fem site in prep for possible temp perm  9/12 intolerant of EN, vomiting.  9/16 right heart cath:   RHC Procedural Findings (epinephrine 1, vasopressin 0.02): Hemodynamics (mmHg) RA mean 15 RV 64/17 PA 65/25, mean 41 PCWP mean 15 Oxygen saturations: PA 55% AO 94% Cardiac Output (Fick) 6.35  Cardiac Index (Fick) 3.16  Cardiac Output (Thermo) 6.39  Cardiac Index (Thermo) 3.17 PVR 4.1 WU     Interim History / Subjective:  Back fro right heart cath   Objective:  Blood pressure (!) 113/50, pulse 82, temperature 98.1 F (36.7 C), temperature source Axillary, resp. rate 15, height 6\' 3"  (1.905 m), weight 74 kg, SpO2 93%. CVP:  [7 mmHg-22 mmHg] 15 mmHg      Intake/Output Summary (Last 24 hours) at 07/17/2023 1120 Last data filed at 07/19/2023 0900 Gross per 24 hour  Intake 1724.28 ml  Output 4978 ml  Net -3253.72 ml   Filed Weights   07/23/23 0600 07/23/23 1224 07/21/2023 0500  Weight: 78.3 kg 78.2 kg 74 kg   Physical Examination:  General this is a severely debilitated 52 year old male laying in bed. He is profoundly weak  HENT NCAT no JVD MM dry. Temporal wasting unable to phonate past whisper  Pulm decreased bases Card rrr (paced @ 82 bpm) Abd soft  Ext warm no edema pulses strong Neuro intact  Gu anuric   Resolved problem list  Acute resp failure w hypoxia and hypercarbia Pericardial effusion without tamponade  Assessment & Plan:   Cardiogenic shock due to biventricular HF  w/ refractory vasoplegia  s/p MVR TVR AS s/p bioprosthetic replacements & MAZE procedure  c/b CHB, severe pHTN  Cardiogenic shock, improved a bit after fistula closure; right heart cath reviewed. Not really any parameters to change  Plan Cont temp demand pacing Cont tele  Cont midodrine 30mg  tid Cont cont epi and vasopressin Resuming CRRT for volume management  At some point would need perm pacemaker but if can't get off CRRT no reason to put him thru  another procedure   ESRD on iHD CKD s/p failed Rtxp 1999, 2015  Plan CRRT  If we can get him off CRRT still not sure he would be able to tolerate iHD   H/o HSV- Plan con't acyclovir, PTA med  Anemia, multifactorial Plan Trend cbc Trigger for transfusion < 7   Severe kcal/protein malnutrition EN intolerance w/ nausea and vomiting Seems to be exacerbated in low flow state & w/ volume up> N/V.  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  plan PT/OT, SLP anticipate CIR eventually if recovers   Best Practice (right click and "Reselect all SmartList Selections" daily)   Diet/type:TF  DVT prophylaxis: heparin sq GI prophylaxis: PPI Lines: Central line and Dialysis Catheter Foley:  No  Code  Status:  full code Last date of multidisciplinary goals of care discussion [Per Primary Team]  Simonne Martinet ACNP-BC [facility] Pulmonary/Critical Care Pager # [phone] OR # [phone] if no answer

## 2023-07-24 NOTE — Plan of Care (Signed)
  Problem: Clinical Measurements: Goal: Respiratory complications will improve Outcome: Progressing Goal: Cardiovascular complication will be avoided Outcome: Progressing   Problem: Nutrition: Goal: Adequate nutrition will be maintained Outcome: Not Progressing   Problem: Coping: Goal: Level of anxiety will decrease Outcome: Progressing   Problem: Elimination: Goal: Will not experience complications related to bowel motility Outcome: Progressing   Problem: Pain Managment: Goal: General experience of comfort will improve Outcome: Progressing

## 2023-07-24 NOTE — Interval H&P Note (Signed)
History and Physical Interval Note:  08/07/2023 9:39 AM  [name]  has presented today for surgery, with the diagnosis of HF.  The various methods of treatment have been discussed with the patient and family. After consideration of risks, benefits and other options for treatment, the patient has consented to  Procedure(s): RIGHT HEART CATH (N/A) as a surgical intervention.  The patient's history has been reviewed, patient examined, no change in status, stable for surgery.  I have reviewed the patient's chart and labs.  Questions were answered to the patient's satisfaction.     Calvert Charland [employer]

## 2023-07-24 NOTE — Progress Notes (Signed)
PT Cancellation Note  Patient Details Name: [name] MRN: [id number] DOB: 1971/06/01   Cancelled Treatment:    Reason Eval/Treat Not Completed: Patient at procedure or test/unavailable. Pt off the floor at Cath Lab. Acute PT to return as able to progress mobility as appropriate.  [name], PT, DPT Acute Rehabilitation Services Secure chat preferred Office #: [phone]    [name] 08/06/2023, 9:46 AM

## 2023-07-24 NOTE — Progress Notes (Signed)
Echocardiogram 2D Echocardiogram has been performed.  [name] 07/13/2023, 2:39 PM

## 2023-07-24 NOTE — Plan of Care (Signed)
  Problem: Education: Goal: Knowledge of General Education information will improve Description: Including pain rating scale, medication(s)/side effects and non-pharmacologic comfort measures Outcome: Progressing   Problem: Nutrition: Goal: Adequate nutrition will be maintained Outcome: Progressing   Problem: Safety: Goal: Ability to remain free from injury will improve Outcome: Progressing   Problem: Respiratory: Goal: Respiratory status will improve Outcome: Progressing   Problem: Skin Integrity: Goal: Wound healing without signs and symptoms of infection Outcome: Progressing Goal: Risk for impaired skin integrity will decrease Outcome: Progressing

## 2023-07-24 NOTE — Progress Notes (Signed)
Occupational Therapy Treatment Patient Details Name: [name]: [id number] DOB: [birth year] Today's Date: 07/20/2023   History of present illness Pt is a 52 y.o. male admitted 06/08/2023 for same day planned tricuspid valve repair, mitral valve repair, aortic valve replacement, maze, TEE. Post-op course complicated by CHB, shock, AKI, respiratory failure. CRRT initiated 8/16, then resumed 8/26. ETT 8/15-8/17. RHC 9/4 and 9/6. PMH includes CHF, HTN, NICM, polyneuropathy, ESRD on HD (s/p renal transplant 1999, 2015), bipolar, ADHD, anxiety, depression.   OT comments  Pt progressing towards goals this session, completing bed mobility with mod-max A +2, and mod-max A for standing from EOB. Pt able to sit EOB x10 min during session for seated grooming task, fatiguing quickly needing cues to keep head up/eyes open throughout. Pt presenting with impairments listed below, will follow acutely. Patient will benefit from intensive inpatient follow up therapy, >3 hours/day to maximize safety/ind with ADLs/functional mobility.       If plan is discharge home, recommend the following:  Two people to help with walking and/or transfers;A lot of help with bathing/dressing/bathroom;Assistance with cooking/housework;Direct supervision/assist for financial management;Direct supervision/assist for medications management;Assist for transportation;Help with stairs or ramp for entrance   Equipment Recommendations  Other (comment) (defer)    Recommendations for Other Services PT consult    Precautions / Restrictions Precautions Precautions: Fall;Sternal Precaution Booklet Issued: No Precaution Comments: CRRT, external pacer in neck Restrictions Other Position/Activity Restrictions: sternal prec       Mobility Bed Mobility Overal bed mobility: Needs Assistance Bed Mobility: Supine to Sit, Sit to Supine     Supine to sit: Mod assist, +2 for physical assistance Sit to supine: Max assist, +2 for physical  assistance        Transfers Overall transfer level: Needs assistance Equipment used: 2 person hand held assist Transfers: Sit to/from Stand Sit to Stand: Mod assist, Max assist, +2 physical assistance           General transfer comment: use of bed pad and pt holding onto back of therapist's arms     Balance Overall balance assessment: Needs assistance Sitting-balance support: No upper extremity supported, Feet supported Sitting balance-Leahy Scale: Fair Sitting balance - Comments: post lean after ~3 min seated EOB   Standing balance support: No upper extremity supported, During functional activity Standing balance-Leahy Scale: Poor Standing balance comment: reliant on external support                           ADL either performed or assessed with clinical judgement   ADL Overall ADL's : Needs assistance/impaired     Grooming: Oral care;Sitting Grooming Details (indicate cue type and reason): seated EOB                             Functional mobility during ADLs: Moderate assistance;Maximal assistance;+2 for physical assistance      Extremity/Trunk Assessment Upper Extremity Assessment Upper Extremity Assessment: Generalized weakness   Lower Extremity Assessment Lower Extremity Assessment: Defer to PT evaluation        Vision   Vision Assessment?: No apparent visual deficits Additional Comments: keeps eyes closed frequently, WFL for BADL   Perception Perception Perception: Not tested   Praxis Praxis Praxis: Not tested    Cognition Arousal: Alert Behavior During Therapy: Flat affect Overall Cognitive Status: Within Functional Limits for tasks assessed  General Comments: follows commands with incr time        Exercises      Shoulder Instructions       General Comments VSS on RA    Pertinent Vitals/ Pain       Pain Assessment Pain Assessment: No/denies pain  Home Living                                           Prior Functioning/Environment              Frequency  Min 1X/week        Progress Toward Goals  OT Goals(current goals can now be found in the care plan section)  Progress towards OT goals: Progressing toward goals  Acute Rehab OT Goals Patient Stated Goal: none stated OT Goal Formulation: With patient Time For Goal Achievement: 08/07/23 Potential to Achieve Goals: Good ADL Goals Pt Will Perform Grooming: with supervision;sitting Pt Will Transfer to Toilet: with mod assist;bedside commode;squat pivot transfer;stand pivot transfer Pt/caregiver will Perform Home Exercise Program: Both right and left upper extremity;Increased ROM;Increased strength;With Supervision Additional ADL Goal #1: pt will perform bed mobility with min A in prep for ADLs  Plan      Co-evaluation                 AM-PAC OT "6 Clicks" Daily Activity     Outcome Measure   Help from another person eating meals?: A Little Help from another person taking care of personal grooming?: A Little Help from another person toileting, which includes using toliet, bedpan, or urinal?: A Lot Help from another person bathing (including washing, rinsing, drying)?: A Lot Help from another person to put on and taking off regular upper body clothing?: A Lot Help from another person to put on and taking off regular lower body clothing?: A Lot 6 Click Score: 14    End of Session    OT Visit Diagnosis: Unsteadiness on feet (R26.81);Other abnormalities of gait and mobility (R26.89);Muscle weakness (generalized) (M62.81)   Activity Tolerance Patient tolerated treatment well   Patient Left in bed;with call bell/phone within reach;with bed alarm set;with nursing/sitter in room   Nurse Communication Mobility status; confirmed pt can get OOB         Time: 1610-9604 OT Time Calculation (min): 28 min  Charges: OT General Charges $OT Visit: 1 Visit OT  Treatments $Self Care/Home Management : 8-22 mins  Carver Fila, OTD, OTR/L SecureChat Preferred Acute Rehab [phone]   Oluwatoyin Banales K Koonce 08/01/2023, 1:32 PM

## 2023-07-24 NOTE — Progress Notes (Signed)
[address]       [place] [address]             [phone]    BP (!) 105/42   Pulse 82   Temp 98.1 F (36.7 C) (Oral)   Resp (!) 25   Ht 6\' 3"  (1.905 m)   Wt 74 kg   SpO2 96%   BMI 20.39 kg/m    Intake/Output Summary (Last 24 hours) at 08/01/2023 1738 Last data filed at 07/17/2023 1700 Gross per 24 hour  Intake 1878.28 ml  Output 4762.2 ml  Net -2883.92 ml   RHC  RA 15, RV 64/17 PA 65/25 PCWP 15 Ci 3.1  [name] C. Dorris Fetch, MD [facility] [phone]

## 2023-07-24 NOTE — Progress Notes (Signed)
[address]       [place] [address]             [phone]      10 Days Post-Op  Procedure(s) (LRB): LIGATION OF RIGHT ARTERIOVENOUS  FISTULA (Right)   Total Length of Stay:  LOS: 32 days    SUBJECTIVE: Was out in chair for 3 hours Tolerated CCRT ok  Vitals:   2023-08-20 0655 August 20, 2023 0700  BP: (!) 94/40 (!) 86/50  Pulse: 82 82  Resp: (!) 21 (!) 24  Temp:    SpO2: 95% 96%    Intake/Output      09/15 0701 Aug 20, 2023 0700 08-20-23 0701 09/17 0700   I.V. (mL/kg) 478.7 (6.5)    Blood 320    Other     NG/GT 1485.8    Total Intake(mL/kg) 2284.5 (30.9)    Emesis/NG output     Stool     CRRT 5827    Total Output 5827    Net -3542.5              prismasol BGK 4/2.5 600 mL/hr at 07/23/23 2121    prismasol BGK 4/2.5 400 mL/hr at 07/23/23 0347   sodium chloride 10 mL/hr at 2023-08-20 0700   sodium chloride 10 mL/hr at 08-20-2023 0700   albumin human Stopped (06/23/23 0040)   epinephrine 0.5 mcg/min (2023/08/20 0700)   feeding supplement (VITAL 1.5 CAL) Stopped (Aug 20, 2023 0016)   prismasol BGK 4/2.5 1,500 mL/hr at 2023/08/20 0039   vasopressin 0.02 Units/min (08-20-2023 0700)    CBC    Component Value Date/Time   WBC 5.9 08/20/23 0355   RBC 2.52 (L) 20-Aug-2023 0355   HGB 7.9 (L) 2023-08-20 0355   HCT 26.4 (L) Aug 20, 2023 0355   PLT 215 08/20/2023 0355   MCV 104.8 (H) 08/20/2023 0355   MCH 31.3 2023/08/20 0355   MCHC 29.9 (L) August 20, 2023 0355   RDW 20.7 (H) Aug 20, 2023 0355   LYMPHSABS 1.6 07/18/2023 0423   MONOABS 1.3 (H) 07/18/2023 0423   EOSABS 0.4 07/18/2023 0423   BASOSABS 0.1 07/18/2023 0423   CMP     Component Value Date/Time   NA 131 (L) August 20, 2023 0355   K 4.3 2023/08/20 0355   CL 103 2023/08/20 0355   CO2 22 2023/08/20 0355   GLUCOSE 84 2023-08-20 0355   BUN 33 (H) 2023-08-20 0355   CREATININE 2.06 (H) 08/20/2023 0355   CALCIUM 9.1 2023/08/20 0355   CALCIUM 9.8 02/18/2011 1007   PROT 8.9 (H) 07/18/2023 0423   ALBUMIN 2.5 (L)  Aug 20, 2023 0355   AST 45 (H) 07/18/2023 0423   ALT 17 07/18/2023 0423   ALKPHOS 117 07/18/2023 0423   BILITOT 0.5 07/18/2023 0423   GFRNONAA 39 (L) 2023-08-20 0355   GFRAA 10 (L) 03/16/2020 0825   ABG    Component Value Date/Time   PHART 7.317 (L) 07/11/2023 1346   PCO2ART 48.2 (H) 07/11/2023 1346   PO2ART 103 07/11/2023 1346   HCO3 27.9 07/10/2023 1038   HCO3 27.6 07/16/2023 1038   TCO2 29 07/17/2023 1038   TCO2 29 08/03/2023 1038   ACIDBASEDEF 1.0 07/18/2023 1517   O2SAT 72.7 2023/08/20 0355   CBG (last 3)  Recent Labs    August 20, 2023 0004 08/20/2023 0400 08-20-23 0606  GLUCAP 119* 73 87  EXAM Lungs: rhonchorous Card: RR Ext: warm Neuro: withdrawn   ASSESSMENT: SP MV/TV repair For RHC today to assess PHTN  if high will need to discuss options  with poor prognosis with pt and family   Eugenio Hoes, MD 08/06/2023

## 2023-07-24 NOTE — Progress Notes (Signed)
Echo attempted. RN setting pt up for CRRT. Will attempt again later.

## 2023-07-24 NOTE — Progress Notes (Addendum)
Patient ID: [name], male   DOB: 1971/04/14, 52 y.o.   MRN: [id number]     Advanced Heart Failure Rounding Note  PCP-Cardiologist: Reatha Harps, MD   Subjective:   8/15: Elective triple valve surgery + MAZE. Underwent bioprosthetic AVR + mitral and tricuspid valve repairs by Dr. Milinda Antis 8/22: AHF consulted for high-output HF with persistent post-op shock 8/25: CRRT stopped 8/26: CRRT restarted  8/27: S/p TTVP Echo: EF 70-75%, severe LVH, RV mod reduced, RV severely enlarged. LA/RA mod dilated. Large pericardial effusion. No evidence of tamponade.  2023/08/03: RHC c/w severe PAH with normal output and low SVR. PVR 4-5. SBP improved nearly 20 points with AVF occlusion.  9/6: Repeat RHC with subsequent ligation of AVF SVR ~500 despite high-dose pressors 9/9: Echo with EF 60-65%, moderate RV dysfunction/severe RV enlargement, D-shaped septum, large pericardial effusion without tamponade, stable bioprosthetic AVR, s/p MV repair with mean gradient 8, s/p TV repair.    Patient was unable to tolerate iHD, now back on epinephrine 0.05 and vasopressin 0.02 and getting CVVH. Slowed rate down due to hypotension.    CVP 9-11  He is RV paced via TTVP.    Denies SOB.    Objective:    Weight Range: 74 kg Body mass index is 20.39 kg/m.   Vital Signs:   Temp:  [96.9 F (36.1 C)-98.2 F (36.8 C)] 98.1 F (36.7 C) (09/16 0300) Pulse Rate:  [78-83] 82 (09/16 0800) Resp:  [19-32] 25 (09/16 0800) BP: (86-124)/(33-91) 99/45 (09/16 0800) SpO2:  [91 %-100 %] 91 % (09/16 0800) Weight:  [74 kg-78.2 kg] 74 kg (09/16 0500) Last BM Date : 07/23/23  Weight change: Filed Weights   07/23/23 0600 07/23/23 1224 07/16/2023 0500  Weight: 78.3 kg 78.2 kg 74 kg   Intake/Output:   Intake/Output Summary (Last 24 hours) at 07/22/2023 0816 Last data filed at 07/29/2023 0700 Gross per 24 hour  Intake 2170.55 ml  Output 5577 ml  Net -3406.45 ml   CVP 12-13  Physical Exam  General:  Appears critically ill. No  resp difficulty. On CRRT + TVP HEENT: + [place]rak  Neck: supple. JVD 9-10  Carotids 2+ bilat; no bruits. No lymphadenopathy or thryomegaly appreciated. Cor: PMI nondisplaced. Regular rate & rhythm. No rubs, gallops. SEM 2/6 RUSB.  Lungs: clear Abdomen: soft, nontender, nondistended. No hepatosplenomegaly. No bruits or masses. Good bowel sounds. Extremities: no cyanosis, clubbing, rash, edema Neuro: alert & orientedx3, cranial nerves grossly intact. moves all 4 extremities w/o difficulty. Affect flat    Telemetry   V Paced 80s   Labs    CBC Recent Labs    07/23/23 0457 07/22/2023 0355  WBC 4.8 5.9  HGB 7.2* 7.9*  HCT 24.5* 26.4*  MCV 111.4* 104.8*  PLT 202 215   Basic Metabolic Panel Recent Labs    40/98/11 0457 07/23/23 1559 08/07/2023 0355  NA 131* 130* 131*  K 4.6 4.5 4.3  CL 98 100 103  CO2 25 25 22   GLUCOSE 124* 127* 84  BUN 39* 34* 33*  CREATININE 2.60* 2.22* 2.06*  CALCIUM 10.4* 9.5 9.1  MG 2.4  --  2.4  PHOS 2.5 2.3* 1.9*   Liver Function Tests Recent Labs    07/23/23 1559 07/17/2023 0355  ALBUMIN 2.6* 2.5*   No results for input(s): "LIPASE", "AMYLASE" in the last 72 hours. Cardiac Enzymes No results for input(s): "CKTOTAL", "CKMB", "CKMBINDEX", "TROPONINI" in the last 72 hours.  BNP: BNP (last 3 results) No results for input(s): "BNP"  in the last 8760 hours.  ProBNP (last 3 results) No results for input(s): "PROBNP" in the last 8760 hours.  D-Dimer No results for input(s): "DDIMER" in the last 72 hours. Hemoglobin A1C No results for input(s): "HGBA1C" in the last 72 hours. Fasting Lipid Panel No results for input(s): "CHOL", "HDL", "LDLCALC", "TRIG", "CHOLHDL", "LDLDIRECT" in the last 72 hours. Thyroid Function Tests No results for input(s): "TSH", "T4TOTAL", "T3FREE", "THYROIDAB" in the last 72 hours.  Invalid input(s): "FREET3"  Other results:  Imaging   No results found.  Medications:     Scheduled Medications:  acyclovir  200 mg  Per Tube BID   aspirin  81 mg Per Tube Daily   atorvastatin  10 mg Per Tube QHS   azelastine  2 spray Each Nare BID   Chlorhexidine Gluconate Cloth  6 each Topical Daily   darbepoetin (ARANESP) injection - DIALYSIS  150 mcg Subcutaneous Q Tue-1800   escitalopram  10 mg Per Tube Daily   feeding supplement (PROSource TF20)  60 mL Per Tube Q4H   folic acid  1 mg Per Tube Daily   Gerhardt's butt cream   Topical Daily   guaiFENesin  15 mL Per Tube BID   heparin flush  10 Units Intracatheter Once   heparin injection (subcutaneous)  5,000 Units Subcutaneous Q8H   influenza vac split trivalent PF  0.5 mL Intramuscular Once   lidocaine-EPINEPHrine  20 mL Other Once   metoCLOPramide (REGLAN) injection  10 mg Intravenous Q12H   midodrine  30 mg Per Tube TID   multivitamin  1 tablet Per Tube BID   mouth rinse  15 mL Mouth Rinse 4 times per day   pantoprazole (PROTONIX) IV  40 mg Intravenous Q12H   sodium chloride flush  3 mL Intravenous Q12H   thiamine  100 mg Per Tube Daily    Infusions:   prismasol BGK 4/2.5 600 mL/hr at 07/23/23 2121    prismasol BGK 4/2.5 400 mL/hr at 07/23/23 0347   sodium chloride 10 mL/hr at 08-16-23 0700   sodium chloride 10 mL/hr at Aug 16, 2023 0700   albumin human Stopped (06/23/23 0040)   epinephrine 0.5 mcg/min (08-16-2023 0700)   feeding supplement (VITAL 1.5 CAL) Stopped (2023/08/16 0016)   potassium PHOSPHATE IVPB (in mmol)     prismasol BGK 4/2.5 1,500 mL/hr at 08-16-23 0039   vasopressin 0.02 Units/min (08/16/2023 0700)    PRN Medications: sodium chloride, acetaminophen, albumin human, bisacodyl **OR** bisacodyl, clonazepam, dextrose, docusate, heparin, ondansetron (ZOFRAN) IV, oxyCODONE, phenol, polyethylene glycol, simethicone, sodium chloride, sodium chloride flush  Patient Profile   52 y/o AAM w/ mildly reduced systolic heart failure (EF once 45-50% in 2013 but normal on subsequent echos) valvular heart disease including severe AS, severe MR due to MVP and  severe TR, atrial fibrillation, pulmonary hypertension, 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 EF 55-60% w/ mild RV dysfunction by echo in 6/24.    Admitted for elective triple vale surgery + MAZE. S/p bioprosthetic aortic valve replacement + mitral and tricuspid valve repairs. Post-op course c/b persistent vasoplagic syndrome.   Assessment/Plan   1. Post-op Vasoplegia/high-output HF - Echo 06/29/23 EF 60-65%, no RWMA, small pericardial effusion present, MV repaired/replaced, trivial MR. TR repaired/replaced, mild TR. AV repaired/replaced.  - Echo 06/29/2023: EF 70-75%, severe concentric LVH, RV mod reduced, RV severely enlarged. LA/RA mod dilated. Large pericardial effusion. No evidence of tamponade - Echo 08/29: Moderate pericardial effusion,  no tamponade - RHC c/w high output HF physiology 2/2 AV fistula despite severe RV dysfunction  - s/p AVF ligation on 20-Jul-2023 - Echo (9/9) with EF 60-65%, moderate RV dysfunction/severe RV enlargement, D-shaped septum, large pericardial effusion without tamponade, stable bioprosthetic AVR, s/p MV repair with mean gradient 8, s/p TV repair.     2.  Acute on Chronic Systolic Heart Failure w/ Prominent RV Dysfunction  - LVEF once 45-50% in 2013 but normal on subsequent echos. Pre-op TTE 6/25 EF 55-60%, GIIIDD (Restrictive) RV mildly reduced, mod PH w/ estimated RVSP 48 mmHg  - Intra-op TEE (while on 4 mcg of Epi) LVEF 45%, RV mod-severely reduced, stable AV prosthesis w/ normal gradient and stable valvuloplasty rings w/ mod residual MS, mean gradient 7 mmgH.   - echo 06/08/2023: EF 70-75%, severe concentric LVH, RV mod reduced, RV severely enlarged. LA/RA mod dilated. Large pericardial effusion. No evidence of tamponade.  - Echo 08/29: EF 65-70%, RV moderately reduced, pericardial effusion slightly smaller, no tamponade - RHC c/w high output HF physiology with very high pulmonary pressures 2/2 AV fistula  despite severe RV dysfunction - s/p Fistula ligation 07-20-23   - Echo (9/9) with EF 60-65%, moderate RV dysfunction/severe RV enlargement, D-shaped septum, large pericardial effusion without tamponade, stable bioprosthetic AVR, s/p MV repair with mean gradient 8, s/p TV repair.   - Back on epinephrine 0.05+ vasopressin 0.02, unable to tolerate iHD and on CVVH again.  Also on midodrine 30 tid.   - CVP 12-13 . Remains on CVVH. Rate slower due to hypotension.  -  Plan RHC today to further assess hemodynamics.   3. Valvular Heart Disease  - pre-op diagnosis, severe AS, severe MR 2/2 MVP, severe TR - s/p bioprosthetic AVR, mitral and tricuspid valve repairs - stable gradients on intra-op TEE post repairs - stable on echo 8/22 and 8/27 - stable on echo 9/9, mod MS mG 8.3 mmHg    4. Post-op CHB  - EP following. - S/p RIJ temp wire 08/27 - EPW left in place d/t concern for risk of tamponade - V paced 80.  Still has TTVP, if we proceed with aggressive care, he will need temporary/permanent pacemaker.    5. ESRD on HD  - s/p failed kidney transplants in 1999 and again in 2015 - Has failed iHD so far and remains on pressors, getting CVVH.    6. H/o Atrial Fibrillation  - S/p MAZE    7. Large pericardial effusion - seen on echo 06/19/2023. Previously small on echo 06/29/23 - no evidence of tamponade - Limited echo 08/29 stable - Echo 9/9, large pericardial effusion but no tamponade.   8. Severe PAH - pre-op PA pressures 58/30. With PVR 1.2 CO 10L  - post-op PA 97/33 (55) PCWP 30 PVR 4.2 CO 7L - suspect combination of valvular heart disease and high-output physiology - should improved with AVF ligation - Will repeat RHC today.   9. GOC - palliative care initially following. Now signed off 9/2 per patient request - remains full code - RHC today if no progress will need to strongly consider restarting palliative discussions. He has been unable to come off CVVH.   RHC this morning. Tube feeds on  hold.    Length of Stay: 32  [name] Clegg, NP  08/05/2023, 8:16 AM  Advanced Heart Failure Team Pager [phone] (M-F; 7a - 5p)  Please contact [facility] for night-coverage after hours (5p -7a ) and weekends on [email or web address]  Patient seen with  NP, agree with the above note.   RHC today: RHC Procedural Findings (epinephrine 1, vasopressin 0.02): Hemodynamics (mmHg) RA mean 15 RV 64/17 PA 65/25, mean 41 PCWP mean 15 Oxygen saturations: PA 55% AO 94% Cardiac Output (Fick) 6.35  Cardiac Index (Fick) 3.16  Cardiac Output (Thermo) 6.39  Cardiac Index (Thermo) 3.17 PVR 4.1 WU PAPi 2.7   Patient denies dyspnea.  Has been getting CVVH, I/Os net negative 3542 cc yesterday and weight down 9 lbs.   General: NAD, frail.  Neck: JVP 12-14 cm, no thyromegaly or thyroid nodule.  Lungs: Clear to auscultation bilaterally with normal respiratory effort. CV: Nondisplaced PMI.  Heart regular S1/S2, no S3/S4, no murmur.  No peripheral edema.   Abdomen: Soft, nontender, no hepatosplenomegaly, no distention.  Skin: Intact without lesions or rashes.  Neurologic: Alert and oriented x 3.  Psych: Normal affect. Extremities: No clubbing or cyanosis.  HEENT: Normal.   RHC today showed predominantly RV failure but with adequate PAPi.  PA pressure is moderately elevated, lower than prior. Cardiac output is also mildly lower s/p ligation of AV fistula. Would continue CVVH, aim for net UF 100-150 cc/hr as is being done currently.  When CVP is down to around 10, would try again to convert to iHD.  He will need to come off epinephrine and vasopressin to transition to iHD. If he is unable to transition to iHD off pressors, will need to consider comfort care transition.  He is very frail and I worry that he is not going to progress.   CRITICAL CARE Performed by: Marca Ancona  Total critical care time: 35 minutes  Critical care time was exclusive of separately billable procedures and treating other  patients.  Critical care was necessary to treat or prevent imminent or life-threatening deterioration.  Critical care was time spent personally by me on the following activities: development of treatment plan with patient and/or surrogate as well as nursing, discussions with consultants, evaluation of patient's response to treatment, examination of patient, obtaining history from patient or surrogate, ordering and performing treatments and interventions, ordering and review of laboratory studies, ordering and review of radiographic studies, pulse oximetry and re-evaluation of patient's condition.  Marca Ancona 07/30/2023 10:21 AM

## 2023-07-24 NOTE — Progress Notes (Signed)
Physical Therapy Treatment Patient Details Name: [name] MRN: [id number] DOB: [birth year] Today's Date: 07/25/2023   History of Present Illness Pt is a 52 y.o. male admitted 06/25/2023 for same day planned tricuspid valve repair, mitral valve repair, aortic valve replacement, maze, TEE. Post-op course complicated by CHB, [name], AKI, respiratory failure. CRRT initiated 8/16, then resumed 8/26. ETT 8/15-8/17. RHC 9/4 and 9/6. PMH includes CHF, HTN, NICM, polyneuropathy, ESRD on HD (s/p renal transplant 1999, 2015), bipolar, ADHD, anxiety, depression.    PT Comments  Worked with OT to progress EOB and OOB mobility however remains limited due to CRRT and external pacer wire in neck in addition to L femoral line. Per RN, medical team wants to advance mobility in hopes to improve heart condition to then be able to remove external pacer in R neck. Despite being in bed for a month pt mobilizing well and tolerating activity well considering medical condition. Acute PT to cont to follow.    If plan is discharge home, recommend the following: A lot of help with walking and/or transfers;A lot of help with bathing/dressing/bathroom;Assistance with cooking/housework;Assist for transportation;Help with stairs or ramp for entrance   Can travel by private vehicle        Equipment Recommendations   (TBD)    Recommendations for Other Services OT consult;Rehab consult     Precautions / Restrictions Precautions Precautions: Fall;Sternal Precaution Booklet Issued: No Precaution Comments: CRRT, external pacer in R neck, RN present to watch all lines Restrictions Weight Bearing Restrictions: Yes RUE Weight Bearing: Non weight bearing LUE Weight Bearing: Non weight bearing Other Position/Activity Restrictions: sternal prec     Mobility  Bed Mobility Overal bed mobility: Needs Assistance Bed Mobility: Supine to Sit, Sit to Supine Rolling: Max assist   Supine to sit: Mod assist, +2 for physical  assistance Sit to supine: Max assist, +2 for physical assistance   General bed mobility comments: pt able to initiate moving LEs to EOB, modA for trunk elevation and to scoot to EOB using bed pad, RN to monitor lines    Transfers Overall transfer level: Needs assistance Equipment used: 2 person hand held assist (face to face transfer with bed pad) Transfers: Sit to/from Stand Sit to Stand: Mod assist, Max assist, +2 physical assistance           General transfer comment: use of bed pad and pt holding onto back of therapist's arms, completed 2 sit to stands to scoot to [facility], pt unable to advance LEs to take a step    Ambulation/Gait               General Gait Details: unable   Stairs             Wheelchair Mobility     Tilt Bed    Modified Rankin (Stroke Patients Only)       Balance Overall balance assessment: Needs assistance Sitting-balance support: No upper extremity supported, Feet supported Sitting balance-Leahy Scale: Fair Sitting balance - Comments: post lean after but able to correct with verbal cues however unable to maintain > 2 min   Standing balance support: No upper extremity supported, During functional activity Standing balance-Leahy Scale: Poor Standing balance comment: reliant on external support                            Cognition Arousal: Alert (but sleepy) Behavior During Therapy: Flat affect Overall Cognitive Status: Impaired/Different from baseline Area of Impairment: Memory,  Safety/judgement                     Memory: Decreased short-term memory   Safety/Judgement: Decreased awareness of deficits     General Comments: follows simple commands with increased time, decreased insight to medical condition but eager to mobilize        Exercises General Exercises - Lower Extremity Long Arc Quad: AROM, Both, 10 reps, Seated (pt with significant posterior lean when initiating LAQ) Other Exercises Other  Exercises: gentle cervical stretch into extension    General Comments General comments (skin integrity, edema, etc.): VSS on RA      Pertinent Vitals/Pain Pain Assessment Pain Assessment: Faces Faces Pain Scale: Hurts little more Pain Location: R knee Pain Descriptors / Indicators: Discomfort, Grimacing    Home Living                          Prior Function            PT Goals (current goals can now be found in the care plan section) Acute Rehab PT Goals Patient Stated Goal: regain independence PT Goal Formulation: With patient Time For Goal Achievement: 07/26/23 Potential to Achieve Goals: Fair Progress towards PT goals: Progressing toward goals    Frequency    Min 1X/week      PT Plan      Co-evaluation PT/OT/SLP Co-Evaluation/Treatment: Yes Reason for Co-Treatment: Complexity of the patient's impairments (multi-system involvement);For patient/therapist safety PT goals addressed during session: Mobility/safety with mobility        AM-PAC PT "6 Clicks" Mobility   Outcome Measure  Help needed turning from your back to your side while in a flat bed without using bedrails?: A Lot Help needed moving from lying on your back to sitting on the side of a flat bed without using bedrails?: A Lot Help needed moving to and from a bed to a chair (including a wheelchair)?: A Lot Help needed standing up from a chair using your arms (e.g., wheelchair or bedside chair)?: A Lot Help needed to walk in hospital room?: Total Help needed climbing 3-5 steps with a railing? : Total 6 Click Score: 10    End of Session   Activity Tolerance: Patient limited by fatigue (CRRT and external pacer in neck) Patient left: in bed;with call bell/phone within reach;with bed alarm set;with nursing/sitter in room Nurse Communication: Mobility status PT Visit Diagnosis: Other abnormalities of gait and mobility (R26.89);Muscle weakness (generalized) (M62.81)     Time:  8295-6213 PT Time Calculation (min) (ACUTE ONLY): 28 min  Charges:    $Therapeutic Activity: 8-22 mins PT General Charges $$ ACUTE PT VISIT: 1 Visit                     [name], PT, DPT Acute Rehabilitation Services Secure chat preferred Office #: [phone]    [name] 2023/08/22, 2:43 PM

## 2023-07-25 ENCOUNTER — Encounter (HOSPITAL_COMMUNITY): Payer: Self-pay | Admitting: Cardiology

## 2023-07-25 DIAGNOSIS — I5023 Acute on chronic systolic (congestive) heart failure: Secondary | ICD-10-CM | POA: Diagnosis not present

## 2023-07-25 DIAGNOSIS — I442 Atrioventricular block, complete: Secondary | ICD-10-CM | POA: Diagnosis not present

## 2023-07-25 DIAGNOSIS — I272 Pulmonary hypertension, unspecified: Secondary | ICD-10-CM | POA: Diagnosis not present

## 2023-07-25 DIAGNOSIS — Z9889 Other specified postprocedural states: Secondary | ICD-10-CM | POA: Diagnosis not present

## 2023-07-25 LAB — RENAL FUNCTION PANEL
Albumin: 2.7 g/dL — ABNORMAL LOW (ref 3.5–5.0)
Albumin: 2.8 g/dL — ABNORMAL LOW (ref 3.5–5.0)
Anion gap: 6 (ref 5–15)
Anion gap: 8 (ref 5–15)
BUN: 32 mg/dL — ABNORMAL HIGH (ref 6–20)
BUN: 32 mg/dL — ABNORMAL HIGH (ref 6–20)
CO2: 25 mmol/L (ref 22–32)
CO2: 26 mmol/L (ref 22–32)
Calcium: 9.2 mg/dL (ref 8.9–10.3)
Calcium: 9.4 mg/dL (ref 8.9–10.3)
Chloride: 99 mmol/L (ref 98–111)
Chloride: 97 mmol/L — ABNORMAL LOW (ref 98–111)
Creatinine, Ser: 1.89 mg/dL — ABNORMAL HIGH (ref 0.61–1.24)
Creatinine, Ser: 1.95 mg/dL — ABNORMAL HIGH (ref 0.61–1.24)
GFR, Estimated: 43 mL/min — ABNORMAL LOW (ref >60)
GFR, Estimated: 41 mL/min — ABNORMAL LOW (ref >60)
Glucose, Bld: 119 mg/dL — ABNORMAL HIGH (ref 70–99)
Glucose, Bld: 90 mg/dL (ref 70–99)
Phosphorus: 2.4 mg/dL — ABNORMAL LOW (ref 2.5–4.6)
Phosphorus: 2 mg/dL — ABNORMAL LOW (ref 2.5–4.6)
Potassium: 4.9 mmol/L (ref 3.5–5.1)
Potassium: 5 mmol/L (ref 3.5–5.1)
Sodium: 130 mmol/L — ABNORMAL LOW (ref 135–145)
Sodium: 131 mmol/L — ABNORMAL LOW (ref 135–145)

## 2023-07-25 LAB — CBC
HCT: 26.5 % — ABNORMAL LOW (ref 39.0–52.0)
HCT: 27.9 % — ABNORMAL LOW (ref 39.0–52.0)
Hemoglobin: 7.9 g/dL — ABNORMAL LOW (ref 13.0–17.0)
Hemoglobin: 8.4 g/dL — ABNORMAL LOW (ref 13.0–17.0)
MCH: 31.6 pg (ref 26.0–34.0)
MCH: 32.3 pg (ref 26.0–34.0)
MCHC: 29.8 g/dL — ABNORMAL LOW (ref 30.0–36.0)
MCHC: 30.1 g/dL (ref 30.0–36.0)
MCV: 106 fL — ABNORMAL HIGH (ref 80.0–100.0)
MCV: 107.3 fL — ABNORMAL HIGH (ref 80.0–100.0)
Platelets: 227 10*3/uL (ref 150–400)
Platelets: 235 10*3/uL (ref 150–400)
RBC: 2.5 MIL/uL — ABNORMAL LOW (ref 4.22–5.81)
RBC: 2.6 MIL/uL — ABNORMAL LOW (ref 4.22–5.81)
RDW: 20.3 % — ABNORMAL HIGH (ref 11.5–15.5)
RDW: 20.6 % — ABNORMAL HIGH (ref 11.5–15.5)
WBC: 5.9 10*3/uL (ref 4.0–10.5)
WBC: 7 10*3/uL (ref 4.0–10.5)
nRBC: 0.5 % — ABNORMAL HIGH (ref 0.0–0.2)
nRBC: 0.7 % — ABNORMAL HIGH (ref 0.0–0.2)

## 2023-07-25 LAB — GLUCOSE, CAPILLARY
Glucose-Capillary: 105 mg/dL — ABNORMAL HIGH (ref 70–99)
Glucose-Capillary: 106 mg/dL — ABNORMAL HIGH (ref 70–99)
Glucose-Capillary: 109 mg/dL — ABNORMAL HIGH (ref 70–99)
Glucose-Capillary: 119 mg/dL — ABNORMAL HIGH (ref 70–99)
Glucose-Capillary: 123 mg/dL — ABNORMAL HIGH (ref 70–99)
Glucose-Capillary: 68 mg/dL — ABNORMAL LOW (ref 70–99)
Glucose-Capillary: 76 mg/dL (ref 70–99)

## 2023-07-25 LAB — COOXEMETRY PANEL
Carboxyhemoglobin: 3.1 % — ABNORMAL HIGH (ref 0.5–1.5)
Methemoglobin: <0.7 % (ref 0.0–1.5)
O2 Saturation: 78.2 %
Total hemoglobin: 8.5 g/dL — ABNORMAL LOW (ref 12.0–16.0)

## 2023-07-25 LAB — MAGNESIUM: Magnesium: 2.6 mg/dL — ABNORMAL HIGH (ref 1.7–2.4)

## 2023-07-25 MED ORDER — CLONAZEPAM 0.25 MG PO TBDP: 0.5000 mg | ORAL_TABLET | Freq: Two times a day (BID) | ORAL | Status: DC | PRN

## 2023-07-25 MED ORDER — LOPERAMIDE HCL 1 MG/7.5ML PO SUSP
1.0000 mg | ORAL | Status: DC | PRN
  Administered 2023-07-26 (×2): 1 mg
  Filled 2023-07-25 (×4): qty 7.5

## 2023-07-25 MED ORDER — DROXIDOPA 100 MG PO CAPS
100.0000 mg | ORAL_CAPSULE | Freq: Three times a day (TID) | ORAL | Status: DC
  Administered 2023-07-25 – 2023-07-30 (×15): 100 mg via ORAL
  Filled 2023-07-25 (×17): qty 1

## 2023-07-25 MED ORDER — VITAL 1.5 CAL PO LIQD
1000.0000 mL | ORAL | Status: DC
  Administered 2023-07-27 – 2023-07-28 (×2): 1000 mL

## 2023-07-25 MED ORDER — DEXTROSE 50 % IV SOLN
INTRAVENOUS | Status: AC
  Administered 2023-07-25: 25 mL via INTRAVENOUS
  Filled 2023-07-25: qty 50

## 2023-07-25 NOTE — Progress Notes (Signed)
Patient ID: [name], male   DOB: 1971/06/23, 52 y.o.   MRN: [id number] TCTS Evening Rounds:  BP 90's to low 100's on vaso .02 and epi 0.5.  V paced 92  CVP 20  CRRT -600 cc so far today.  Has been up in chair for a short period of time.

## 2023-07-25 NOTE — Progress Notes (Signed)
SLP Cancellation Note  Patient Details Name: [name] MRN: [id number] DOB: [birth year]   Cancelled treatment:        Pt is back on CRRT and is unable to transport to radiology for MBSS. RN reports poor oral tolerance of swabs with coughing noted. SLP will continue to follow for readiness for further instrumental assessment.    Kerrie Pleasure, MA, CCC-SLP Acute Rehabilitation Services Office: [phone] 07/25/2023, 8:04 AM

## 2023-07-25 NOTE — Progress Notes (Addendum)
Patient ID: [name], male   DOB: 1971/06/30, 52 y.o.   MRN: [id number]     Advanced Heart Failure Rounding Note  PCP-Cardiologist: Reatha Harps, MD   Subjective:   8/15: Elective triple valve surgery + MAZE. Underwent bioprosthetic AVR + mitral and tricuspid valve repairs by Dr. Milinda Antis 8/22: AHF consulted for high-output HF with persistent post-op shock 8/25: CRRT stopped 8/26: CRRT restarted  8/27: S/p TTVP Echo: EF 70-75%, severe LVH, RV mod reduced, RV severely enlarged. LA/RA mod dilated. Large pericardial effusion. No evidence of tamponade.  2023-07-15: RHC c/w severe PAH with normal output and low SVR. PVR 4-5. SBP improved nearly 20 points with AVF occlusion.  9/6: Repeat RHC with subsequent ligation of AVF SVR ~500 despite high-dose pressors 9/9: Echo with EF 60-65%, moderate RV dysfunction/severe RV enlargement, D-shaped septum, large pericardial effusion without tamponade, stable bioprosthetic AVR, s/p MV repair with mean gradient 8, s/p TV repair.   9/16- RHC showed predominantly RV failure but with adequate PAPi. PA pressure is moderately elevated, lower than prior. Cardiac output is also mildly lower s/p ligation of AV fistula.   Off Epi. Remains on Vasopressin 0.01 units and getting CVVH.  He is RV paced via TTVP. Remains in CHB. No for temp-perm  or PPM   Had a hard time sleeping.    Objective:    Weight Range: 73.7 kg Body mass index is 20.31 kg/m.   Vital Signs:   Temp:  [97.5 F (36.4 C)-98.8 F (37.1 C)] 97.5 F (36.4 C) (09/17 0745) Pulse Rate:  [81-86] 82 (09/17 0900) Resp:  [15-31] 28 (09/17 0900) BP: (90-129)/(39-93) 93/69 (09/17 0900) SpO2:  [92 %-100 %] 96 % (09/17 0900) Weight:  [73.7 kg] 73.7 kg (09/17 0354) Last BM Date : 07/25/23  Weight change: Filed Weights   07/23/23 1224 07/22/2023 0500 07/25/23 0354  Weight: 78.2 kg 74 kg 73.7 kg   Intake/Output:   Intake/Output Summary (Last 24 hours) at 07/25/2023 0906 Last data filed at 07/25/2023  0800 Gross per 24 hour  Intake 2019.54 ml  Output 4160.3 ml  Net -2140.76 ml  CVP 11-12   Physical Exam  General: Frail + TVP on CRRT HEENT: normal Neck: supple. JVP 11-12. Carotids 2+ bilat; no bruits. No lymphadenopathy or thryomegaly appreciated. Cor: PMI nondisplaced. Regular rate & rhythm. No rubs, gallops or murmurs. Sternal incision  Lungs: clear Abdomen: soft, nontender, nondistended. No hepatosplenomegaly. No bruits or masses. Good bowel sounds. Extremities: no cyanosis, clubbing, rash, edema Neuro: Drowsy & orientedx3, cranial nerves grossly intact. moves all 4 extremities w/o difficulty. Affect flat   Telemetry   V paced 80s   Labs    CBC Recent Labs    07/30/2023 0355 07/10/2023 0958 07/25/2023 0959 07/25/23 0722  WBC 5.9  --   --  5.9  HGB 7.9*   < > 9.2* 7.9*  HCT 26.4*   < > 27.0* 26.5*  MCV 104.8*  --   --  106.0*  PLT 215  --   --  227   < > = values in this interval not displayed.   Basic Metabolic Panel Recent Labs    40/34/74 0355 08/06/2023 0958 07/20/2023 1600 07/25/23 0357  NA 131*   < > 131* 130*  K 4.3   < > 4.5 4.9  CL 103  --  100 99  CO2 22  --  23 25  GLUCOSE 84  --  119* 119*  BUN 33*  --  34* 32*  CREATININE 2.06*  --  2.09* 1.89*  CALCIUM 9.1  --  8.9 9.2  MG 2.4  --   --  2.6*  PHOS 1.9*  --  3.3 2.4*   < > = values in this interval not displayed.   Liver Function Tests Recent Labs    08/05/2023 1600 07/25/23 0357  ALBUMIN 2.5* 2.7*   No results for input(s): "LIPASE", "AMYLASE" in the last 72 hours. Cardiac Enzymes No results for input(s): "CKTOTAL", "CKMB", "CKMBINDEX", "TROPONINI" in the last 72 hours.  BNP: BNP (last 3 results) No results for input(s): "BNP" in the last 8760 hours.  ProBNP (last 3 results) No results for input(s): "PROBNP" in the last 8760 hours.  D-Dimer No results for input(s): "DDIMER" in the last 72 hours. Hemoglobin A1C No results for input(s): "HGBA1C" in the last 72 hours. Fasting Lipid  Panel No results for input(s): "CHOL", "HDL", "LDLCALC", "TRIG", "CHOLHDL", "LDLDIRECT" in the last 72 hours. Thyroid Function Tests No results for input(s): "TSH", "T4TOTAL", "T3FREE", "THYROIDAB" in the last 72 hours.  Invalid input(s): "FREET3"  Other results:  Imaging   ECHOCARDIOGRAM COMPLETE  Result Date: 07/30/2023    ECHOCARDIOGRAM REPORT   Patient Name:   [name] Date of Exam: 07/20/2023 Medical Rec #:  [id number]  Height:       75.0 in Accession #:    [id number] Weight:       163.1 lb Date of Birth:  [birth year]  BSA:          2.012 m Patient Age:    52 years   BP:           92/39 mmHg Patient Gender: M          HR:           82 bpm. Exam Location:  Inpatient Procedure: 2D Echo, Cardiac Doppler and Color Doppler Indications:    s/p MVR  History:        Patient has prior history of Echocardiogram examinations, most                 recent 07/17/2023. CHF and Cardiomyopathy, CAD, Pulmonary HTN, 30                 mm TV ring; Risk Factors:Sleep Apnea and Hypertension. ESRD on                 HD.                 Aortic Valve: 27 mm bioprosthetic valve is present in the aortic                 position.                 Mitral Valve: 30 mm MV Band valve is present in the mitral                 position.  Sonographer:    Milda Smart Referring Phys: [id number] Eugenio Hoes  Sonographer Comments: Image acquisition challenging due to patient body habitus and Image acquisition challenging due to respiratory motion. IMPRESSIONS  1. Left ventricular ejection fraction, by estimation, is 60 to 65%. The left ventricle has normal function. The left ventricle has no regional wall motion abnormalities. There is moderate concentric left ventricular hypertrophy. Left ventricular diastolic parameters are indeterminate. There is the interventricular septum is flattened in systole and diastole, consistent with right ventricular pressure and volume overload.  2. Right ventricular systolic function is severely  reduced.  The right ventricular size is severely enlarged. There is severely elevated pulmonary artery systolic pressure. The estimated right ventricular systolic pressure is 46.6 mmHg.  3. Left atrial size was severely dilated.  4. Right atrial size was moderately dilated.  5. The mitral valve has been repaired/replaced. No evidence of mitral valve regurgitation. Moderate mitral stenosis. The mean mitral valve gradient is 8.0 mmHg. There is a 30 mm MV Band present in the mitral position.  6. Moderate pericardial effusion. The pericardial effusion is circumferential. There is no evidence of cardiac tamponade.  7. A MC3 ring size 30 is present in the tricuspid position. The tricuspid valve is has been repaired/replaced.  8. The aortic valve has been repaired/replaced. Aortic valve regurgitation is not visualized. No aortic stenosis is present. There is a 27 mm bioprosthetic valve present in the aortic position.  9. The inferior vena cava is dilated in size with <50% respiratory variability, suggesting right atrial pressure of 15 mmHg. FINDINGS  Left Ventricle: Left ventricular ejection fraction, by estimation, is 60 to 65%. The left ventricle has normal function. The left ventricle has no regional wall motion abnormalities. The left ventricular internal cavity size was normal in size. There is  moderate concentric left ventricular hypertrophy. The interventricular septum is flattened in systole and diastole, consistent with right ventricular pressure and volume overload. Left ventricular diastolic parameters are indeterminate. Right Ventricle: The right ventricular size is severely enlarged. Right ventricular systolic function is severely reduced. There is severely elevated pulmonary artery systolic pressure. The tricuspid regurgitant velocity is 2.81 m/s, and with an assumed right atrial pressure of 15 mmHg, the estimated right ventricular systolic pressure is 46.6 mmHg. Left Atrium: Left atrial size was severely dilated.  Right Atrium: Right atrial size was moderately dilated. Pericardium: A moderately sized pericardial effusion is present. The pericardial effusion is circumferential. There is no evidence of cardiac tamponade. Mitral Valve: The mitral valve has been repaired/replaced. No evidence of mitral valve regurgitation. There is a 30 mm MV Band present in the mitral position. Moderate mitral valve stenosis. MV peak gradient, 18.3 mmHg. The mean mitral valve gradient is 8.0 mmHg. Tricuspid Valve: A MC3 ring size 30 is present in the tricuspid position. The tricuspid valve is has been repaired/replaced. Tricuspid valve regurgitation is mild . No evidence of tricuspid stenosis. Aortic Valve: The aortic valve has been repaired/replaced. Aortic valve regurgitation is not visualized. No aortic stenosis is present. Aortic valve mean gradient measures 10.0 mmHg. Aortic valve peak gradient measures 18.5 mmHg. Aortic valve area, by VTI measures 1.94 cm. There is a 27 mm bioprosthetic valve present in the aortic position. Pulmonic Valve: The pulmonic valve was normal in structure. Pulmonic valve regurgitation is mild. No evidence of pulmonic stenosis. Aorta: The aortic root is normal in size and structure. Venous: The inferior vena cava is dilated in size with less than 50% respiratory variability, suggesting right atrial pressure of 15 mmHg. IAS/Shunts: No atrial level shunt detected by color flow Doppler.  LEFT VENTRICLE PLAX 2D LVIDd:         4.10 cm   Diastology LVIDs:         2.50 cm   LV e' medial:  3.81 cm/s LV PW:         1.20 cm   LV e' lateral: 3.70 cm/s LV IVS:        1.40 cm LVOT diam:     2.10 cm LV SV:         60  LV SV Index:   30 LVOT Area:     3.46 cm  RIGHT VENTRICLE            IVC RV Basal diam:  5.80 cm    IVC diam: 3.50 cm RV Mid diam:    4.80 cm RV S prime:     4.73 cm/s TAPSE (M-mode): 0.7 cm LEFT ATRIUM              Index        RIGHT ATRIUM           Index LA diam:        5.00 cm  2.48 cm/m   RA Area:     22.50  cm LA Vol (A2C):   96.1 ml  47.75 ml/m  RA Volume:   67.80 ml  33.69 ml/m LA Vol (A4C):   112.0 ml 55.66 ml/m LA Biplane Vol: 105.0 ml 52.18 ml/m  AORTIC VALVE AV Area (Vmax):    1.76 cm AV Area (Vmean):   1.82 cm AV Area (VTI):     1.94 cm AV Vmax:           215.00 cm/s AV Vmean:          144.000 cm/s AV VTI:            0.310 m AV Peak Grad:      18.5 mmHg AV Mean Grad:      10.0 mmHg LVOT Vmax:         109.00 cm/s LVOT Vmean:        75.500 cm/s LVOT VTI:          0.174 m LVOT/AV VTI ratio: 0.56  AORTA Ao Root diam: 2.90 cm Ao Asc diam:  2.60 cm MITRAL VALVE              TRICUSPID VALVE MV Area VTI:  1.22 cm    TV Peak grad:   8.8 mmHg MV Peak grad: 18.3 mmHg   TV Mean grad:   4.0 mmHg MV Mean grad: 8.0 mmHg    TV Vmax:        1.48 m/s MV Vmax:      2.14 m/s    TV Vmean:       92.4 cm/s MV Vmean:     125.0 cm/s  TV VTI:         0.38 msec                           TR Peak grad:   31.6 mmHg                           TR Mean grad:   23.0 mmHg                           TR Vmax:        281.00 cm/s                           TR Vmean:       234.0 cm/s                            SHUNTS  Systemic VTI:  0.17 m                           Systemic Diam: 2.10 cm Olga Millers MD Electronically signed by Olga Millers MD Signature Date/Time: 08/04/2023/3:08:31 PM    Final    CARDIAC CATHETERIZATION  Result Date: 07/16/2023 1. Borderline elevated PCWP. 2. Primarily right heart failure with elevated RA pressure and RVEDP. 3. Moderate mixed pulmonary arterial/pulmonary venous hypertension. 4. Preserved cardiac output.    Medications:     Scheduled Medications:  acyclovir  200 mg Per Tube BID   aspirin  81 mg Per Tube Daily   atorvastatin  10 mg Per Tube QHS   azelastine  2 spray Each Nare BID   Chlorhexidine Gluconate Cloth  6 each Topical Daily   darbepoetin (ARANESP) injection - DIALYSIS  150 mcg Subcutaneous Q Tue-1800   escitalopram  10 mg Per Tube Daily   feeding supplement  (PROSource TF20)  60 mL Per Tube Q4H   folic acid  1 mg Per Tube Daily   Gerhardt's butt cream   Topical Daily   guaiFENesin  15 mL Per Tube BID   heparin flush  10 Units Intracatheter Once   heparin  5,000 Units Subcutaneous Q8H   influenza vac split trivalent PF  0.5 mL Intramuscular Once   lidocaine-EPINEPHrine  20 mL Other Once   metoCLOPramide (REGLAN) injection  10 mg Intravenous Q12H   midodrine  30 mg Per Tube TID   multivitamin  1 tablet Per Tube BID   mouth rinse  15 mL Mouth Rinse 4 times per day   pantoprazole (PROTONIX) IV  40 mg Intravenous Q12H   sodium chloride flush  3 mL Intravenous Q12H   sodium chloride flush  3 mL Intravenous Q12H   thiamine  100 mg Per Tube Daily    Infusions:   prismasol BGK 4/2.5 600 mL/hr at 07/25/23 0416    prismasol BGK 4/2.5 400 mL/hr at 07/25/23 0259   sodium chloride 10 mL/hr at 07/15/2023 0800   sodium chloride     albumin human Stopped (06/23/23 0040)   epinephrine Stopped (07/25/23 0559)   feeding supplement (VITAL 1.5 CAL) 60 mL/hr at 07/25/23 0800   prismasol BGK 4/2.5 1,500 mL/hr at 07/25/23 0413   vasopressin 0.01 Units/min (07/25/23 0800)    PRN Medications: sodium chloride, sodium chloride, acetaminophen (TYLENOL) oral liquid 160 mg/5 mL, albumin human, bisacodyl **OR** bisacodyl, clonazepam, dextrose, docusate, heparin, ondansetron (ZOFRAN) IV, oxyCODONE, phenol, polyethylene glycol, simethicone, sodium chloride, sodium chloride flush, sodium chloride flush  Patient Profile   52 y/o AAM w/ mildly reduced systolic heart failure (EF once 45-50% in 2013 but normal on subsequent echos) valvular heart disease including severe AS, 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 EF 55-60% w/ mild RV dysfunction by echo in 6/24.    Admitted for elective triple vale surgery + MAZE. S/p bioprosthetic  aortic valve replacement + mitral and tricuspid valve repairs. Post-op course c/b persistent vasoplagic syndrome.   Assessment/Plan   1. Post-op Vasoplegia/high-output HF - Echo 06/29/23 EF 60-65%, no RWMA, small pericardial effusion present, MV repaired/replaced, trivial MR. TR repaired/replaced, mild TR. AV repaired/replaced.  - Echo 07/08/2023: EF 70-75%, severe concentric LVH, RV mod reduced, RV severely enlarged. LA/RA mod dilated. Large pericardial effusion. No evidence of tamponade -  Echo 08/29: Moderate pericardial effusion, no tamponade - RHC c/w high output HF physiology 2/2 AV fistula despite severe RV dysfunction  - s/p AVF ligation on 9/6 - Echo (9/9) with EF 60-65%, moderate RV dysfunction/severe RV enlargement, D-shaped septum, large pericardial effusion without tamponade, stable bioprosthetic AVR, s/p MV repair with mean gradient 8, s/p TV repair.    2.  Acute on Chronic Systolic Heart Failure w/ Prominent RV Dysfunction  - LVEF once 45-50% in 2013 but normal on subsequent echos. Pre-op TTE 6/25 EF 55-60%, GIIIDD (Restrictive) RV mildly reduced, mod PH w/ estimated RVSP 48 mmHg  - Intra-op TEE (while on 4 mcg of Epi) LVEF 45%, RV mod-severely reduced, stable AV prosthesis w/ normal gradient and stable valvuloplasty rings w/ mod residual MS, mean gradient 7 mmgH.   - echo 2023-07-20: EF 70-75%, severe concentric LVH, RV mod reduced, RV severely enlarged. LA/RA mod dilated. Large pericardial effusion. No evidence of tamponade.  - Echo 08/29: EF 65-70%, RV moderately reduced, pericardial effusion slightly smaller, no tamponade - RHC c/w high output HF physiology with very high pulmonary pressures 2/2 AV fistula despite severe RV dysfunction - s/p Fistula ligation 9/6   - Echo (9/9) with EF 60-65%, moderate RV dysfunction/severe RV enlargement, D-shaped septum, large pericardial effusion without tamponade, stable bioprosthetic AVR, s/p MV repair with mean gradient 8, s/p TV repair.    Yesterday RHC showed predominantly RV failure but with adequate PAPi. PA pressure is moderately elevated, lower than prior. Cardiac output is also mildly lower s/p ligation of AV fistula.  - Echo (9/17) with  LV EF 60-65%, moderate LVH, D-shaped septum, severe RV enlargement with severely decreased systolic function, IVC dilated, bioprosthetic aortic valve stable, MV repair with no MR/mean gradient 8, s/p TV repair.  -Off Epi. Vasopressin down to 0.01 units.  - On CRRT tolerating 120-150 removal.   Also on midodrine 30 tid.   3. Valvular Heart Disease  - pre-op diagnosis, severe AS, severe MR 2/2 MVP, severe TR - s/p bioprosthetic AVR, mitral and tricuspid valve repairs - stable gradients on intra-op TEE post repairs - stable on echo 8/22 and 07/20/23 - stable on echo 9/9, mod MS mG 8.3 mmHg   4. Post-op CHB  - EP following. - S/p RIJ temp wire 07-20-23 - EPW left in place d/t concern for risk of tamponade - V paced 80.  Still has TTVP, with CHB.  - Needs to tolerate iHD before device   5. ESRD on HD  - s/p failed kidney transplants in 1999 and again in 2015 - Has failed iHD so far and remains on pressors, getting CVVH.   6. H/o Atrial Fibrillation  - S/p MAZE   7. Large pericardial effusion - seen on echo July 20, 2023. Previously small on echo 06/29/23 - no evidence of tamponade - Limited echo 08/29 stable - Echo 9/9, large pericardial effusion but no tamponade.  8. Severe PAH - pre-op PA pressures 58/30. With PVR 1.2 CO 10L  - post-op PA 97/33 (55) PCWP 30 PVR 4.2 CO 7L - suspect combination of valvular heart disease and high-output physiology - should improved with AVF ligation -  RHC  PA 65/25 (41), PCWP 15 PVR 4.1  9. GOC - palliative care initially following. Now signed off 9/2 per patient request - remains full code - If unable to tolerate iHD will need Hospice.  10. FEN - Getting tube feeds. - Remains NPO with paralyzed vocal cord and aspiration risk.   Length of Stay: 32  Tonye Becket, NP  07/25/2023, 9:06 AM  Advanced Heart Failure Team Pager [phone] (M-F; 7a - 5p)  Please contact [facility] for night-coverage after hours (5p -7a ) and weekends on [email or web address]  Patient seen with NP, agree with the above note.   Able to wean off epinephrine and vasopressin today.  He is now just on high dose midodrine.  He is tolerating CVVH pulling UF 150 cc/hr.  CVP still 19.   RHC 9/16: RHC Procedural Findings (epinephrine 1, vasopressin 0.02): Hemodynamics (mmHg) RA mean 15 RV 64/17 PA 65/25, mean 41 PCWP mean 15 Oxygen saturations: PA 55% AO 94% Cardiac Output (Fick) 6.35  Cardiac Index (Fick) 3.16  Cardiac Output (Thermo) 6.39  Cardiac Index (Thermo) 3.17 PVR 4.1 WU PAPi 2.7   Echo (9/16): LV EF 60-65%, moderate LVH, D-shaped septum, severe RV enlargement with severely decreased systolic function, IVC dilated, bioprosthetic aortic valve stable, MV repair with no MR/mean gradient 8, s/p TV repair.   General: Frail, cachectic.  Neck: JVP 16+, no thyromegaly or thyroid nodule.  Lungs: Clear to auscultation bilaterally with normal respiratory effort. CV: Nondisplaced PMI.  Heart regular S1/S2, no S3/S4,1/6 SEM RUSB.  No peripheral edema.   Abdomen: Soft, nontender, no hepatosplenomegaly, no distention.  Skin: Intact without lesions or rashes.  Neurologic: Lethargic.  Psych: Flat affect. Extremities: No clubbing or cyanosis.  HEENT: Normal.   Severe RV failure by echo yesterday, has not improved.  LV systolic function normal.  RHC yesterday showed elevated right-sided filling pressures but adequate PAPi.   Still volume overloaded, CVP 19 today.  However, have been able to wean off the pressors today and just on midodrine.  - Needs further CVVH, pulling net negative 150 cc/hr. Weight coming down.  - Once volume is optimized (would like to see CVP 10-12 given RV failure), will need to try to make transition to iHD again.  If he cannot tolerate iHD this time, will  need strong consideration of comfort care transition.   Still getting tube feeds and NPO, will need to get another swallow evaluation.  May need J-tube if he progresses successfully to iHD.   CRITICAL CARE Performed by: Marca Ancona  Total critical care time: 35 minutes  Critical care time was exclusive of separately billable procedures and treating other patients.  Critical care was necessary to treat or prevent imminent or life-threatening deterioration.  Critical care was time spent personally by me on the following activities: development of treatment plan with patient and/or surrogate as well as nursing, discussions with consultants, evaluation of patient's response to treatment, examination of patient, obtaining history from patient or surrogate, ordering and performing treatments and interventions, ordering and review of laboratory studies, ordering and review of radiographic studies, pulse oximetry and re-evaluation of patient's condition.  Marca Ancona 07/25/2023 9:54 AM

## 2023-07-25 NOTE — Progress Notes (Signed)
[facility] Progress Note  Subjective: Seen in room, tolerating 120-150 cc/hr net negative. Off epi gtt and on low dose vaso at 0.1.   Vitals:   07/25/23 1315 07/25/23 1330 07/25/23 1345 07/25/23 1400  BP: (!) 87/39 (!) 89/39 (!) 93/52 (!) 98/38  Pulse: 74 82 82 81  Resp: 20 (!) 26 19 (!) 27  Temp:      TempSrc:      SpO2: 94% 95% 96% 96%  Weight:      Height:        Physical Exam:       General adult male in bed Lungs clear but reduced to auscultation bilaterally  Heart S1S2 Abdomen soft nontender nondistended Extremities no edema appreciated Neuro - resting, somnolent, awakens easily Access left internal jugular tunneled dialysis catheter    OP HD: [place] triad MWF 3rd shift 3h    F250     94kg   2K/2.5 Ca bath   RUE AVF    Summary: [name] is a/an 52 y.o. 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 Underwent aortic biovalve replacement, TV repair, MV repari, bilat Maze procedures.    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    # Complete heart block - post-op, per EP/ cardiology, still with temp pacer but plans for permanent option in process pending stable status  # Possible high-output heart failure - per CHF. SP AVF ligation 9/06 by VVS.  Right heart catheterization today  # ESRD - pt was too hypotensive for hemodialysis post-op.  CRRT started 8/16 and  stopped 07/19/23.  Received HD 9/13 but limited by UF and return to pressors thereafter.  Resume CRRT 9/14.  4K bath.  CVP directing UF goals with AHF assisting. Cont UF 120- 150 cc/hr.   # HD access - AVF ligated for CHF. Now s/p [facility] 07/19/23 with IR. Working well.    # Distributive shock - remains on high dose midodrine 30 tid, down to vaso gtt at low dose.   # pHTN - severe by RHC --> Off NO2 now. Repeat R  HC today.   # Volume - improved, wt's stable.   # Pericardial effusion - per CT surgery and CHF.  We are optimizing volume status with CRRT as above.  Assessed as moderate on last TTE   # Anemia of ESRD - transfuse as needed per primary team. Aranesp increased to 150 mcg weekly on Tuesdays    # MBD ckd -  Phos acceptable.  Calcium as below.  No binders currently as on CRRT - normally on renvela.    # Hypercalcemia -worsened after discontinuation of CRRT. Now has received calcitonin and  pamidronate.  Resuming CRRT helping. Still likely due to immobility. Improved  # Progressive frailty and FTT    Maree Krabbe, MD  07/25/2023, 2:20 PM  Recent Labs  Lab 07/19/2023 0959 08/06/2023 1600 07/25/23 0357 07/25/23 0722  HGB 9.2*  --   --  7.9*  ALBUMIN  --  2.5* 2.7*  --   CALCIUM  --  8.9 9.2  --   PHOS  --  3.3 2.4*  --   CREATININE  --  2.09* 1.89*  --   K 4.8 4.5 4.9  --  Inpatient medications:  acyclovir  200 mg Per Tube BID   aspirin  81 mg Per Tube Daily   atorvastatin  10 mg Per Tube QHS   azelastine  2 spray Each Nare BID   Chlorhexidine Gluconate Cloth  6 each Topical Daily   darbepoetin (ARANESP) injection - DIALYSIS  150 mcg Subcutaneous Q Tue-1800   droxidopa  100 mg Oral TID WC   escitalopram  10 mg Per Tube Daily   feeding supplement (PROSource TF20)  60 mL Per Tube Q4H   folic acid  1 mg Per Tube Daily   Gerhardt's butt cream   Topical Daily   guaiFENesin  15 mL Per Tube BID   heparin flush  10 Units Intracatheter Once   heparin  5,000 Units Subcutaneous Q8H   influenza vac split trivalent PF  0.5 mL Intramuscular Once   lidocaine-EPINEPHrine  20 mL Other Once   metoCLOPramide (REGLAN) injection  10 mg Intravenous Q12H   midodrine  30 mg Per Tube TID   multivitamin  1 tablet Per Tube BID   mouth rinse  15 mL Mouth Rinse 4 times per day   pantoprazole (PROTONIX) IV  40 mg Intravenous Q12H   sodium chloride flush  3 mL Intravenous Q12H   sodium chloride  flush  3 mL Intravenous Q12H   thiamine  100 mg Per Tube Daily     prismasol BGK 4/2.5 600 mL/hr at 07/25/23 1257    prismasol BGK 4/2.5 400 mL/hr at 07/25/23 0259   sodium chloride 10 mL/hr at 07/11/2023 0800   sodium chloride     albumin human Stopped (06/23/23 0040)   epinephrine 1 mcg/min (07/25/23 1400)   feeding supplement (VITAL 1.5 CAL) 60 mL/hr at 07/25/23 1400   prismasol BGK 4/2.5 1,500 mL/hr at 07/25/23 1100   vasopressin 0.02 Units/min (07/25/23 1400)   sodium chloride, sodium chloride, acetaminophen (TYLENOL) oral liquid 160 mg/5 mL, albumin human, bisacodyl **OR** bisacodyl, clonazepam, dextrose, docusate, heparin, ondansetron (ZOFRAN) IV, oxyCODONE, phenol, polyethylene glycol, simethicone, sodium chloride, sodium chloride flush, sodium chloride flush

## 2023-07-25 NOTE — Progress Notes (Signed)
eLink Physician-Brief Progress Note Patient Name: [name] DOB: [birth year] MRN: [id number]   Date of Service  07/25/2023  HPI/Events of Note  Received request for Immodium Loose stools not new. Flexiseal removed today  eICU Interventions  PRN Immodium ordered Discussed with bedside RN     Intervention Category Intermediate Interventions: Other:  Darl Pikes 07/25/2023, 11:55 PM

## 2023-07-25 NOTE — Plan of Care (Signed)
Problem: Education: Goal: Knowledge of General Education information will improve Description: Including pain rating scale, medication(s)/side effects and non-pharmacologic comfort measures Outcome: Progressing   Problem: Health Behavior/Discharge Planning: Goal: Ability to manage health-related needs will improve Outcome: Progressing   Problem: Clinical Measurements: Goal: Ability to maintain clinical measurements within normal limits will improve Outcome: Progressing Goal: Will remain free from infection Outcome: Progressing Goal: Diagnostic test results will improve Outcome: Progressing Goal: Respiratory complications will improve Outcome: Progressing Goal: Cardiovascular complication will be avoided Outcome: Progressing   Problem: Activity: Goal: Risk for activity intolerance will decrease Outcome: Progressing   Problem: Nutrition: Goal: Adequate nutrition will be maintained Outcome: Progressing   Problem: Coping: Goal: Level of anxiety will decrease Outcome: Progressing   Problem: Elimination: Goal: Will not experience complications related to bowel motility Outcome: Progressing   Problem: Pain Managment: Goal: General experience of comfort will improve Outcome: Progressing   Problem: Safety: Goal: Ability to remain free from injury will improve Outcome: Progressing   Problem: Skin Integrity: Goal: Risk for impaired skin integrity will decrease Outcome: Progressing   Problem: Education: Goal: Knowledge of disease or condition will improve Outcome: Progressing Goal: Knowledge of the prescribed therapeutic regimen will improve Outcome: Progressing   Problem: Activity: Goal: Risk for activity intolerance will decrease Outcome: Progressing   Problem: Cardiac: Goal: Will achieve and/or maintain hemodynamic stability Outcome: Progressing   Problem: Clinical Measurements: Goal: Postoperative complications will be avoided or minimized Outcome:  Progressing   Problem: Respiratory: Goal: Respiratory status will improve Outcome: Progressing   Problem: Skin Integrity: Goal: Wound healing without signs and symptoms of infection Outcome: Progressing Goal: Risk for impaired skin integrity will decrease Outcome: Progressing   Problem: Urinary Elimination: Goal: Ability to achieve and maintain adequate renal perfusion and functioning will improve Outcome: Progressing   Problem: Education: Goal: Understanding of CV disease, CV risk reduction, and recovery process will improve Outcome: Progressing Goal: Individualized Educational Video(s) Outcome: Progressing   Problem: Activity: Goal: Ability to return to baseline activity level will improve Outcome: Progressing   Problem: Cardiovascular: Goal: Ability to achieve and maintain adequate cardiovascular perfusion will improve Outcome: Progressing Goal: Vascular access site(s) Level 0-1 will be maintained Outcome: Progressing   Problem: Health Behavior/Discharge Planning: Goal: Ability to safely manage health-related needs after discharge will improve Outcome: Progressing   Problem: Education: Goal: Ability to describe self-care measures that may prevent or decrease complications (Diabetes Survival Skills Education) will improve Outcome: Progressing Goal: Individualized Educational Video(s) Outcome: Progressing   Problem: Coping: Goal: Ability to adjust to condition or change in health will improve Outcome: Progressing   Problem: Fluid Volume: Goal: Ability to maintain a balanced intake and output will improve Outcome: Progressing   Problem: Health Behavior/Discharge Planning: Goal: Ability to identify and utilize available resources and services will improve Outcome: Progressing Goal: Ability to manage health-related needs will improve Outcome: Progressing   Problem: Metabolic: Goal: Ability to maintain appropriate glucose levels will improve Outcome:  Progressing   Problem: Nutritional: Goal: Maintenance of adequate nutrition will improve Outcome: Progressing Goal: Progress toward achieving an optimal weight will improve Outcome: Progressing   Problem: Skin Integrity: Goal: Risk for impaired skin integrity will decrease Outcome: Progressing   Problem: Tissue Perfusion: Goal: Adequacy of tissue perfusion will improve Outcome: Progressing

## 2023-07-25 NOTE — Progress Notes (Signed)
V paced on telemetry  TV temp wire threshold is <1, left at out put of 10 at 82bpm Remains in CHB without escape at 40 this morning Remains on vaso this AM orr epi RHC yetsreday noted In Dr. Lovena Neighbours d/w CCM: Will try one more time to transition to HD, if unable to tolerate will need to revisit/plan Hospice  No temp-perm or PPM for now pending above  Francis Dowse, PA-C

## 2023-07-25 NOTE — Progress Notes (Signed)
Nutrition Follow-up  DOCUMENTATION CODES:   Severe malnutrition in context of chronic illness  INTERVENTION:   If continuing current level of care, recommend permanent feeding tube placement. PEG vs PEJ. Pt currently receiving post pyloric tube feedings and would require J-tube unless able to demonstrate tolerance of gastric feedings  Tube Feeding via Cortrak:  Increase Vital 1.5 at 65 mL/hour with PROSource TF20 60 mL QID TF at goal rate provides 2660 kcal, 185 grams of protein, 1186 mL H2O  Continue Renal MVI BID, thiamine and folic acid  Recommend supplementing low phosphorus levels while on CRRT  NUTRITION DIAGNOSIS:   Severe Malnutrition related to chronic illness as evidenced by severe muscle depletion, severe fat depletion.  Being addressed  GOAL:   Patient will meet greater than or equal to 90% of their needs  Met  MONITOR:   PO intake, TF tolerance, Labs, Weight trends  REASON FOR ASSESSMENT:   Consult Assessment of nutrition requirement/status  ASSESSMENT:   52 y/o male with h/o glomerulonephritis (age 84) s/p renal transplant (1999) and right and left renal transplants (2015), ESRD on HD (>20 yrs), HTN, bipolar 2 disorder, PTSD, ADHD, anxiety, depression, GERD, OSA, NICM, thrombocytopenia, pulmonary hypertension, afib s/p DCCV (03/2019), CHF, severe mitral and tricuspid valve regurgitation and moderate aortic stenosis s/p mitral valve repair, tricuspid valve annuloplasty, aortic valve replacement and full right and left maze procedure 8/16.  8/15 - AVR, MVR, TVR by Dr. Leafy Ro 8/16 - CRRT initiated 8/17 - extubated, clear liquid diet 8/19 - chest tube inserted for effusion, BiPAP overnight 8/20 - no BM, abd x-ray with nonobstructive bowel gas pattern 8/21 - Cortrak placed, TF initiated 8/22 - NPO due to mental status, TF continued 8/25 - CRRT discontinued  8/26 - CRRT restarted 8/28 - TF on hold 8/29 - TF remains on hold 8/30 - trickle TF resumed per  MD, TPN recommended 8/31 - TF rate increased to 30 ml/hr 9/02 - TF rate increased to 35 ml/hr 9/03 - CRRT temporarily off due to access issues, TF rate increased to 40 ml/hr, supplemental TPN recommended, CRRT resumed 9/04  s/p RHC with occlusion of fistula, started iNO 08-09-23 Ligation of R arm fistula with improved BP 9/11 CRRT stopped 9/12 +emesis x 2, TF held, Repeat FEES-NPO, sig dysphagia and newly noted vocal cord paralysis, abd xray with nonobstructive bowel gas pattern 9/13 Cortrak and bridle replaced; noted device related pressure injury from bridle. iHD with minimal UF due to hypotension, Cortrak advanced into duodenum, Trickle 9/14 resuming CRRT; rectal tube replaced  Remain on CRRT, vasopressin 0.02, off epinephrine this AM but back on this afternoon at 1  Remains NPO.  Currently tolerating Vital 1.5 at 60 ml/hr per RN. Cortrak is in duodenum  Rectal tube out, small amounts of liquid stool   Weight down to 73.7 kg, wt continues to trend down  Labs: phosphorus 2.0 (L), potassium 5.0, sodium 131 Meds: reviewed  Diet Order:   Diet Order             Diet NPO time specified  Diet effective now                   EDUCATION NEEDS:   Not appropriate for education at this time  Skin:  Skin Assessment: Skin Integrity Issues: Skin Integrity Issues:: Stage II Stage II: sacrum Incisions: sternal  Last BM:  07/22/23 - large type 7, rectal tube replaced  Height:   Ht Readings from Last 1 Encounters:  06/30/2023 6\' 3"  (  1.905 m)    Weight:   Wt Readings from Last 1 Encounters:  07/25/23 73.7 kg    Ideal Body Weight:  89 kg  BMI:  Body mass index is 20.31 kg/m.  Estimated Nutritional Needs:   Kcal:  2400-2600 kcals  Protein:  155-190 g  Fluid:  1L plus UOP  [name] MS, RDN, LDN, CNSC Registered Dietitian 3 Clinical Nutrition RD Pager and On-Call Pager Number Located in [place]

## 2023-07-25 NOTE — Progress Notes (Signed)
[address]       [place] [address]             [phone]      1 Day Post-Op  Procedure(s) (LRB): RIGHT HEART CATH (N/A)   Total Length of Stay:  LOS: 33 days    SUBJECTIVE: Stable night  Vitals:   07/25/23 0645 07/25/23 0700  BP: (!) 90/48 (!) 102/48  Pulse: 82 82  Resp: (!) 26 (!) 28  Temp:    SpO2: 98% 94%    Intake/Output      09/16 0701 09/17 0700 09/17 0701 09/18 0700   I.V. (mL/kg) 202.7 (2.8)    Blood     NG/GT 1287    IV Piggyback 500    Total Intake(mL/kg) 1989.7 (27)    Stool 85    CRRT 4016.3    Total Output 4101.3    Net -2111.6              prismasol BGK 4/2.5 600 mL/hr at 07/25/23 0416    prismasol BGK 4/2.5 400 mL/hr at 07/25/23 0259   sodium chloride 10 mL/hr at 07/25/2023 0800   sodium chloride     albumin human Stopped (06/23/23 0040)   epinephrine Stopped (07/25/23 0559)   feeding supplement (VITAL 1.5 CAL) 60 mL/hr at 07/16/2023 1900   prismasol BGK 4/2.5 1,500 mL/hr at 07/25/23 0413   vasopressin 0.02 Units/min (07/25/23 0700)    CBC    Component Value Date/Time   WBC 5.9 08/02/2023 0355   RBC 2.52 (L) 08/01/2023 0355   HGB 9.2 (L) 08/07/2023 0959   HCT 27.0 (L) 07/30/2023 0959   PLT 215 07/17/2023 0355   MCV 104.8 (H) 07/16/2023 0355   MCH 31.3 08/07/2023 0355   MCHC 29.9 (L) 07/13/2023 0355   RDW 20.7 (H) 08/04/2023 0355   LYMPHSABS 1.6 07/18/2023 0423   MONOABS 1.3 (H) 07/18/2023 0423   EOSABS 0.4 07/18/2023 0423   BASOSABS 0.1 07/18/2023 0423   CMP     Component Value Date/Time   NA 130 (L) 07/25/2023 0357   K 4.9 07/25/2023 0357   CL 99 07/25/2023 0357   CO2 25 07/25/2023 0357   GLUCOSE 119 (H) 07/25/2023 0357   BUN 32 (H) 07/25/2023 0357   CREATININE 1.89 (H) 07/25/2023 0357   CALCIUM 9.2 07/25/2023 0357   CALCIUM 10.4 (H) 07/23/2023 0457   PROT 8.9 (H) 07/18/2023 0423   ALBUMIN 2.7 (L) 07/25/2023 0357   AST 45 (H) 07/18/2023 0423   ALT 17 07/18/2023 0423   ALKPHOS 117  07/18/2023 0423   BILITOT 0.5 07/18/2023 0423   GFRNONAA 43 (L) 07/25/2023 0357   GFRAA 10 (L) 03/16/2020 0825   ABG    Component Value Date/Time   PHART 7.317 (L) 07/11/2023 1346   PCO2ART 48.2 (H) 07/11/2023 1346   PO2ART 103 07/11/2023 1346   HCO3 25.5 07/16/2023 0959   TCO2 27 07/17/2023 0959   ACIDBASEDEF 1.0 07/18/2023 1517   O2SAT 78.2 07/25/2023 0357   CBG (last 3)  Recent Labs    07/29/2023 1922 07/27/2023 2327 07/25/23 0322  GLUCAP 103* 133* 123*     ASSESSMENT: Sp MV/TV repair AVR MAZE Echo with normal LV poor RV and valves ok with mean mv gradient 8, RHC with moderated PHTN.  Tolerating CRRT well. Will need to make decision when to try conventional dialysis and discuss plans if cannot Wean vaso for SBP >90 Need Swallow study if doesn't pass  consider j tube PT OT OOB to chair  Really need Temp perm wire placed at some point   Eugenio Hoes, MD 07/25/2023

## 2023-07-25 NOTE — Progress Notes (Signed)
Inpatient Rehab Admissions Coordinator:   CIR continues to follow from a distance.  Remains on pressor support, CRRT and unable to come off for iHD.    Estill Dooms, PT, DPT Admissions Coordinator [phone] 07/25/23  1:46 PM

## 2023-07-25 NOTE — Progress Notes (Addendum)
NAME:  [name], MRN:  [id number], DOB:  1971-09-08, LOS: 33 ADMISSION DATE:  06/30/2023, CONSULTATION DATE: 06/23/2023 REFERRING MD: Leafy Ro - TCTS, CHIEF COMPLAINT: Postcardiac surgery  History of Present Illness:  This is a 52 year old gentleman, past medical history of anxiety depression, bipolar disease, congestive heart failure chronic diastolic heart failure kidney disease followed by renal transplant in Oct 16, 1998 repeat renal transplant in Oct 16, 2014.  Patient underwent surgery today for severe mitral regurgitation severe tricuspid regurgitation and moderate aortic stenosis.  He also has atrial fibrillation received maze procedure.  He had a complex mitral valve repair a tricuspid valve annuloplasty and aortic valve replacement followed by full right and left maze procedure.  Pertinent Medical History:   Past Medical History:  Diagnosis Date   ADHD (attention deficit hyperactivity disorder)    Anemia    Anemia, chronic disease 07/13/2011   Anxiety    Arthritis    Bipolar 2 disorder (HCC) 08/18/2011   Diagnosed in October 16, 2010.    Bipolar disorder (HCC)    Blood dyscrasia    Nephrotic Anemia   Blood transfusion    Blood transfusion without reported diagnosis    Chronic diastolic CHF (congestive heart failure) (HCC)    Complication of anesthesia    Woke up intubated gets combative  afraid to be alone    Congestive heart failure (CHF) (HCC) 07/13/2011   Onset 10/16/2005.  Followed by Dr. Eden Emms.  S/p cardiac catheterization in [facility] Dr. Anne Fu..  S/p cardiac catheterization in 16-Oct-2013 by Nishan.  Echo 10-16-2013.    Coronary artery disease    Depression    Dysrhythmia    A. Fib 2023/10/17 with DCCV in May 2024   Erectile dysfunction    ESRD on hemodialysis (HCC) 07/13/2011   Glomerulonephritis at age 74, started HD in 10-17-95.  Deceased donor renal transplant 10/16/98 at [facility], then kidney failed and went back on HD in 16-Oct-2005.  L forearm AVF . s/p repeat renal transplant 10/2014 [facility].   GI bleed 11/01/2011   Rectal  bleeding with emesis/diarrhea    Heart murmur    Hemodialysis status (HCC)    HSV (herpes simplex virus) infection    Hydrocele, right 11/25/2014   Hypertension    Hypertensive emergency 01/06/2013   Hypoglycemia 01/11/2013   Influenza-like illness 12/18/2013   Mild ascending aorta dilatation (HCC)    NICM (nonischemic cardiomyopathy) (HCC)    Pericardial effusion    a. Mod by echo in October 16, 2013, similar to prior.   Peripheral polyneuropathy 08/04/2016   Pneumonia    Pulmonary edema 01/06/2013   Pulmonary hypertension associated with end stage renal disease on dialysis [facility]) 07/13/2011   Renal transplant, status post 11/25/2014   Pt with Glomerulonephritis at age 24. Deceased donor renal transplant 10-16-98 right and left renal transplant 10/2014.  Followed by [facility] transplant team; nephrologist is Dr. Lonna Cobb.    Respiratory failure with hypoxia (HCC) 01/06/2013   Shortness of breath 12/12/2012   Sleep apnea    Swollen testicle 07/13/2011   URI (upper respiratory infection) 01/25/2012    Significant Hospital Events: Including procedures, antibiotic start and stop dates in addition to other pertinent events   8/15 - AVR, MVR, TVR with TCTS ([place]). Remains on multiple pressors (NE, Epi, vaso). 8/16 - LIJ Trialysis catheter placed for CRRT.  8/17 extubated  8/18 - remains on CVVHD, 3 pressors  8/19-8/20 improving pressor needs 8/21 agitated delirium; cortrak 8/26 CRRT restarted 8/27 TVP placed, brady overnight and pacing turned to 80 8/30 palli consult  9/1 PRBC for low coox  9/2 coox down a bit again  9/3CRRT off overnight due to access issues. Remains on vaso 0.04, epi 10, norepi 2, DBA 5. 9/4 RHC, started iNO 9/6 repeat RHC, fistula ligated 9/9 Remains on vaso 0.04, epi . 30 midodrine TID, Temp pacer, small effusions on CXR , mild interstitial edema, left lower lobe consolidation most likely atelectasis , afebrile 9/10 intermittent vasopressor. Echo continue to show  severe RV dysfunction. 9/11 [place] cvc to fem site in prep for possible temp perm  9/12 intolerant of EN, vomiting.  9/16 right heart cath:   RHC Procedural Findings (epinephrine 1, vasopressin 0.02): Hemodynamics (mmHg) RA mean 15 RV 64/17 PA 65/25, mean 41 PCWP mean 15 Oxygen saturations: PA 55% AO 94% Cardiac Output (Fick) 6.35  Cardiac Index (Fick) 3.16  Cardiac Output (Thermo) 6.39  Cardiac Index (Thermo) 3.17 PVR 4.1 WU  9/17 Assessed by SLP. Still not ready for swallow eval. Epinephrine off added droxidopa    Interim History / Subjective:  SLP eval had been requested.  Patient still having poor tolerance with even swabs in his mouth, SLP not feeling he is ready for MBSS  Objective:  Blood pressure (!) 94/54, pulse 82, temperature (!) 97.5 F (36.4 C), temperature source Axillary, resp. rate (!) 22, height 6\' 3"  (1.905 m), weight 73.7 kg, SpO2 96%. CVP:  [11 mmHg-22 mmHg] 21 mmHg      Intake/Output Summary (Last 24 hours) at 07/25/2023 0923 Last data filed at 07/25/2023 0900 Gross per 24 hour  Intake 2082.54 ml  Output 4358.3 ml  Net -2275.76 ml   Filed Weights   07/23/23 1224 08/07/2023 0500 07/25/23 0354  Weight: 78.2 kg 74 kg 73.7 kg   Physical Examination:  General This is a severely debilitated 52 year old male patient who is cachectic and lying in bed he is in no acute distress this morning HEENT normal cephalic does have temporal wasting mucous membranes are moist no clear JVD.  Phonation quality is poor only able to whisper.  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 Pulmonary clear diminished bases currently on room air Cardiac: Completely paced rhythm at 82 bpm Extremities warm dry no significant edema pulses are palpable Neuro awake and oriented GU anuric  Resolved problem list  Acute resp failure w hypoxia and hypercarbia Pericardial effusion without tamponade  Assessment & Plan:   Cardiogenic shock due to  biventricular HF  w/ refractory vasoplegia  s/p MVR TVR AS s/p bioprosthetic replacements & MAZE procedure  c/b CHB, severe pHTN  Cardiogenic shock, improved a bit after fistula closure Plan Cont temp demand pacing Cont tele  Cont midodrine 30mg  tid Adding droxidopa  Epinephrines off, weaning vasopressin for systolic blood pressure greater than 90 Continuing CRRT for now  At some point would need perm pacemaker but if can't get off CRRT no reason to put him thru another procedure   ESRD on iHD CKD s/p failed Rtxp 1999, 2015  Plan CRRT Suspected in the next 24 to 48 hours we can hold CRRT and reassess for IHD to [place]  H/o HSV- Plan con't acyclovir, PTA med  Anemia, multifactorial Hemoglobin stable  plan Trend cbc Trigger for transfusion < 7   Severe kcal/protein malnutrition EN intolerance w/ nausea and vomiting Seems to be exacerbated in low flow state & w/ volume up> N/V.  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  plan PT/OT, SLP as outlined I can't imagine this gentleman is going to leave the hospital    Best Practice (right click and "Reselect all SmartList Selections" daily)   Diet/type:TF  DVT prophylaxis: heparin sq GI prophylaxis: PPI Lines: Central line and Dialysis Catheter Foley:  No  Code Status:  full code Last date of multidisciplinary goals of care discussion per Primary Team  My time 23 min   Simonne Martinet ACNP-BC [facility] Pulmonary/Critical Care Pager # [phone] OR # [phone] if no answer

## 2023-07-26 ENCOUNTER — Other Ambulatory Visit (HOSPITAL_COMMUNITY): Payer: Self-pay

## 2023-07-26 ENCOUNTER — Telehealth (HOSPITAL_COMMUNITY): Payer: Self-pay | Admitting: Pharmacy Technician

## 2023-07-26 DIAGNOSIS — Z9889 Other specified postprocedural states: Secondary | ICD-10-CM | POA: Diagnosis not present

## 2023-07-26 DIAGNOSIS — I5023 Acute on chronic systolic (congestive) heart failure: Secondary | ICD-10-CM | POA: Diagnosis not present

## 2023-07-26 LAB — GLUCOSE, CAPILLARY
Glucose-Capillary: 100 mg/dL — ABNORMAL HIGH (ref 70–99)
Glucose-Capillary: 100 mg/dL — ABNORMAL HIGH (ref 70–99)
Glucose-Capillary: 101 mg/dL — ABNORMAL HIGH (ref 70–99)
Glucose-Capillary: 105 mg/dL — ABNORMAL HIGH (ref 70–99)
Glucose-Capillary: 90 mg/dL (ref 70–99)
Glucose-Capillary: 96 mg/dL (ref 70–99)
Glucose-Capillary: 97 mg/dL (ref 70–99)

## 2023-07-26 MED ORDER — METOCLOPRAMIDE HCL 5 MG/ML IJ SOLN
5.0000 mg | Freq: Two times a day (BID) | INTRAMUSCULAR | Status: DC
  Administered 2023-07-26: 5 mg via INTRAVENOUS
  Filled 2023-07-26: qty 2

## 2023-07-26 MED ORDER — SODIUM PHOSPHATES 45 MMOLE/15ML IV SOLN
15.0000 mmol | Freq: Once | INTRAVENOUS | Status: AC
  Administered 2023-07-26: 15 mmol via INTRAVENOUS
  Filled 2023-07-26: qty 5

## 2023-07-26 NOTE — Progress Notes (Signed)
[address]       [place] [address]             [phone]      2 Days Post-Op  Procedure(s) (LRB): RIGHT HEART CATH (N/A)   Total Length of Stay:  LOS: 34 days    SUBJECTIVE: No change  Vitals:   07/26/23 0645 07/26/23 0700  BP: (!) 94/53 (!) 111/47  Pulse: 82 81  Resp: (!) 26 (!) 24  Temp:    SpO2: 100% 100%    Intake/Output      09/17 0701 09/18 0700 09/18 0701 09/19 0700   I.V. (mL/kg) 197.5 (2.7)    NG/GT 1572    IV Piggyback     Total Intake(mL/kg) 1769.5 (24)    Stool 35    CRRT 2307    Total Output 2342    Net -572.5              prismasol BGK 4/2.5 600 mL/hr at 07/25/23 2239    prismasol BGK 4/2.5 400 mL/hr at 07/26/23 0546   sodium chloride 10 mL/hr at 07/26/23 0700   sodium chloride     albumin human Stopped (06/23/23 0040)   epinephrine Stopped (07/25/23 2033)   feeding supplement (VITAL 1.5 CAL) 60 mL/hr at 07/26/23 0700   prismasol BGK 4/2.5 1,500 mL/hr at 07/26/23 0517   vasopressin 0.03 Units/min (07/26/23 0700)    CBC    Component Value Date/Time   WBC 6.4 07/26/2023 0420   RBC 2.51 (L) 07/26/2023 0420   HGB 8.2 (L) 07/26/2023 0420   HCT 27.2 (L) 07/26/2023 0420   PLT 239 07/26/2023 0420   MCV 108.4 (H) 07/26/2023 0420   MCH 32.7 07/26/2023 0420   MCHC 30.1 07/26/2023 0420   RDW 20.3 (H) 07/26/2023 0420   LYMPHSABS 1.6 07/18/2023 0423   MONOABS 1.3 (H) 07/18/2023 0423   EOSABS 0.4 07/18/2023 0423   BASOSABS 0.1 07/18/2023 0423   CMP     Component Value Date/Time   NA 132 (L) 07/26/2023 0420   K 5.0 07/26/2023 0420   CL 99 07/26/2023 0420   CO2 26 07/26/2023 0420   GLUCOSE 106 (H) 07/26/2023 0420   BUN 31 (H) 07/26/2023 0420   CREATININE 1.75 (H) 07/26/2023 0420   CALCIUM 9.1 07/26/2023 0420   CALCIUM 10.4 (H) 07/23/2023 0457   PROT 8.9 (H) 07/18/2023 0423   ALBUMIN 2.6 (L) 07/26/2023 0420   AST 45 (H) 07/18/2023 0423   ALT 17 07/18/2023 0423   ALKPHOS 117 07/18/2023 0423   BILITOT 0.5  07/18/2023 0423   GFRNONAA 47 (L) 07/26/2023 0420   GFRAA 10 (L) 03/16/2020 0825   ABG    Component Value Date/Time   PHART 7.317 (L) 07/11/2023 1346   PCO2ART 48.2 (H) 07/11/2023 1346   PO2ART 103 07/11/2023 1346   HCO3 25.5 07/30/2023 0959   TCO2 27 07/29/2023 0959   ACIDBASEDEF 1.0 08-01-2023 1517   O2SAT 65.7 07/26/2023 0420   CBG (last 3)  Recent Labs    07/25/23 1925 07/25/23 2331 07/26/23 0327  GLUCAP 106* 76 101*     ASSESSMENT: SP MV TV repair AVR MAZE Went back up on vaso. Need to wean for SBP only of greater than 85-90 Need speech to evaluate Discuss timing of going back to conventional dialysis   Eugenio Hoes, MD 07/26/2023

## 2023-07-26 NOTE — Progress Notes (Signed)
Temp wire threshold this AM <1 with no LOC  Left at 10/82bpm  Low dose vaso Pending timing of re-attempt at [facility], PA-C

## 2023-07-26 NOTE — TOC Benefit Eligibility Note (Signed)
Patient Product/process development scientist completed.    The patient is insured through [employer]. Patient has Medicare and is not eligible for a copay card, but may be able to apply for patient assistance, if available.    Ran test claim for droxidopa 100 mg capsules and Product Not on Formulary.   This test claim was processed through [facility]- copay amounts may vary at other pharmacies due to pharmacy/plan contracts, or as the patient moves through the different stages of their insurance plan.     Roland Earl, CPHT Pharmacy Technician III Certified Patient Advocate [facility] Pharmacy Patient Advocate Team Direct Number: [phone]  Fax: [phone]

## 2023-07-26 NOTE — Telephone Encounter (Signed)
Pharmacy Patient Advocate Encounter   Received notification that prior authorization for Droxidopa 100MG  capsules is required/requested.   Insurance verification completed.   The patient is insured through [employer] .   Per test claim: PA required; PA submitted to [facility] via CoverMyMeds Key/confirmation #/EOC [id number] Status is pending

## 2023-07-26 NOTE — Plan of Care (Signed)

## 2023-07-26 NOTE — Progress Notes (Signed)
SLP  Note  Patient Details Name: [name] MRN: [id number] DOB: 22-Dec-1970   Plan for MBS at 1pm on 9/19 - Pt will need to hold any procedure and come off CRRT for about one hour to complete.    Ranessa Kosta, Riley Nearing 07/26/2023, 1:48 PM

## 2023-07-26 NOTE — Progress Notes (Addendum)
Patient ID: [name], male   DOB: 01/06/1971, 52 y.o.   MRN: [id number]     Advanced Heart Failure Rounding Note  PCP-Cardiologist: Reatha Harps, MD   Subjective:   07/02/2023: Elective triple valve surgery + MAZE. Underwent bioprosthetic AVR + mitral and tricuspid valve repairs by Dr. Milinda Antis 8/22: AHF consulted for high-output HF with persistent post-op shock 8/25: CRRT stopped 8/26: CRRT restarted  8/27: S/p TTVP Echo: EF 70-75%, severe LVH, RV mod reduced, RV severely enlarged. LA/RA mod dilated. Large pericardial effusion. No evidence of tamponade.  9/4: RHC c/w severe PAH with normal output and low SVR. PVR 4-5. SBP improved nearly 20 points with AVF occlusion.  9/6: Repeat RHC with subsequent ligation of AVF SVR ~500 despite high-dose pressors 9/9: Echo with EF 60-65%, moderate RV dysfunction/severe RV enlargement, D-shaped septum, large pericardial effusion without tamponade, stable bioprosthetic AVR, s/p MV repair with mean gradient 8, s/p TV repair.   9/16- RHC showed predominantly RV failure but with adequate PAPi. PA pressure is moderately elevated, lower than prior. Cardiac output is also mildly lower s/p ligation of AV fistula.  9/17 Droxidopa added  Hypotensive this am. Vasopressin increased to 0.03 units  Remains on CVVH.  He is RV paced via TTVP. Remains in CHB. No plan for temp-perm  or PPM   Denies SOB.   RHC 9/16: RHC Procedural Findings (epinephrine 1, vasopressin 0.02): Hemodynamics (mmHg) RA mean 15 RV 64/17 PA 65/25, mean 41 PCWP mean 15 Oxygen saturations: PA 55% AO 94% Cardiac Output (Fick) 6.35  Cardiac Index (Fick) 3.16  Cardiac Output (Thermo) 6.39  Cardiac Index (Thermo) 3.17 PVR 4.1 WU PAPi 2.7   Echo (9/16): LV EF 60-65%, moderate LVH, D-shaped septum, severe RV enlargement with severely decreased systolic function, IVC dilated, bioprosthetic aortic valve stable, MV repair with no MR/mean gradient 8, s/p TV repair.   Objective:    Weight  Range: 73.8 kg Body mass index is 20.34 kg/m.   Vital Signs:   Temp:  [98 F (36.7 C)-98.2 F (36.8 C)] 98 F (36.7 C) (09/18 0739) Pulse Rate:  [74-86] 83 (09/18 0800) Resp:  [15-32] 20 (09/18 0800) BP: (79-111)/(17-88) 98/50 (09/18 0800) SpO2:  [67 %-100 %] 99 % (09/18 0800) Weight:  [73.8 kg] 73.8 kg (09/18 0500) Last BM Date : 07/25/23  Weight change: Filed Weights   07/11/2023 0500 07/25/23 0354 07/26/23 0500  Weight: 74 kg 73.7 kg 73.8 kg   Intake/Output:   Intake/Output Summary (Last 24 hours) at 07/26/2023 0841 Last data filed at 07/26/2023 0800 Gross per 24 hour  Intake 1760.97 ml  Output 2231 ml  Net -470.03 ml    CVP 11-15  Physical Exam  General:  Frail. On CVVH. + TVP HEENT: normal Neck: + Cortrak.  JVP difficult to assess. . Carotids 2+ bilat; no bruits. No lymphadenopathy or thryomegaly appreciated. Cor: PMI nondisplaced. Regular rate & rhythm. No rubs, gallops or murmurs. +TVP  Lungs: clear Abdomen: soft, nontender, nondistended. No hepatosplenomegaly. No bruits or masses. Good bowel sounds. Extremities: no cyanosis, clubbing, rash, edema Neuro: alert & orientedx3, cranial nerves grossly intact. moves all 4 extremities w/o difficulty. Affect flat  Telemetry   V paced.   Labs    CBC Recent Labs    07/25/23 1516 07/26/23 0420  WBC 7.0 6.4  HGB 8.4* 8.2*  HCT 27.9* 27.2*  MCV 107.3* 108.4*  PLT 235 239   Basic Metabolic Panel Recent Labs    78/29/56 0357 07/25/23 1516 07/26/23 0420  NA 130* 131* 132*  K 4.9 5.0 5.0  CL 99 97* 99  CO2 25 26 26   GLUCOSE 119* 90 106*  BUN 32* 32* 31*  CREATININE 1.89* 1.95* 1.75*  CALCIUM 9.2 9.4 9.1  MG 2.6*  --  2.6*  PHOS 2.4* 2.0* 1.9*   Liver Function Tests Recent Labs    07/25/23 1516 07/26/23 0420  ALBUMIN 2.8* 2.6*   No results for input(s): "LIPASE", "AMYLASE" in the last 72 hours. Cardiac Enzymes No results for input(s): "CKTOTAL", "CKMB", "CKMBINDEX", "TROPONINI" in the last 72  hours.  BNP: BNP (last 3 results) No results for input(s): "BNP" in the last 8760 hours.  ProBNP (last 3 results) No results for input(s): "PROBNP" in the last 8760 hours.  D-Dimer No results for input(s): "DDIMER" in the last 72 hours. Hemoglobin A1C No results for input(s): "HGBA1C" in the last 72 hours. Fasting Lipid Panel No results for input(s): "CHOL", "HDL", "LDLCALC", "TRIG", "CHOLHDL", "LDLDIRECT" in the last 72 hours. Thyroid Function Tests No results for input(s): "TSH", "T4TOTAL", "T3FREE", "THYROIDAB" in the last 72 hours.  Invalid input(s): "FREET3"  Other results:  Imaging   No results found.  Medications:     Scheduled Medications:  acyclovir  200 mg Per Tube BID   aspirin  81 mg Per Tube Daily   atorvastatin  10 mg Per Tube QHS   azelastine  2 spray Each Nare BID   Chlorhexidine Gluconate Cloth  6 each Topical Daily   darbepoetin (ARANESP) injection - DIALYSIS  150 mcg Subcutaneous Q Tue-1800   droxidopa  100 mg Oral TID WC   escitalopram  10 mg Per Tube Daily   feeding supplement (PROSource TF20)  60 mL Per Tube Q4H   folic acid  1 mg Per Tube Daily   Gerhardt's butt cream   Topical Daily   guaiFENesin  15 mL Per Tube BID   heparin flush  10 Units Intracatheter Once   heparin  5,000 Units Subcutaneous Q8H   influenza vac split trivalent PF  0.5 mL Intramuscular Once   lidocaine-EPINEPHrine  20 mL Other Once   metoCLOPramide (REGLAN) injection  10 mg Intravenous Q12H   midodrine  30 mg Per Tube TID   multivitamin  1 tablet Per Tube BID   mouth rinse  15 mL Mouth Rinse 4 times per day   pantoprazole (PROTONIX) IV  40 mg Intravenous Q12H   sodium chloride flush  3 mL Intravenous Q12H   sodium chloride flush  3 mL Intravenous Q12H   thiamine  100 mg Per Tube Daily    Infusions:   prismasol BGK 4/2.5 600 mL/hr at 07/25/23 2239    prismasol BGK 4/2.5 400 mL/hr at 07/26/23 0546   sodium chloride 10 mL/hr at 07/26/23 0800   sodium chloride      albumin human Stopped (06/23/23 0040)   epinephrine Stopped (07/25/23 2033)   feeding supplement (VITAL 1.5 CAL) 60 mL/hr at 07/26/23 0800   prismasol BGK 4/2.5 1,500 mL/hr at 07/26/23 0517   vasopressin 0.03 Units/min (07/26/23 0800)    PRN Medications: sodium chloride, sodium chloride, acetaminophen (TYLENOL) oral liquid 160 mg/5 mL, albumin human, bisacodyl **OR** bisacodyl, clonazepam, dextrose, docusate, heparin, loperamide HCl, ondansetron (ZOFRAN) IV, oxyCODONE, phenol, polyethylene glycol, simethicone, sodium chloride, sodium chloride flush, sodium chloride flush  Patient Profile   52 y/o AAM w/ mildly reduced systolic heart failure (EF once 45-50% in 2013 but normal on subsequent echos) valvular heart disease including severe AS, severe MR  due to MVP and severe TR, atrial fibrillation, pulmonary hypertension, 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 EF 55-60% w/ mild RV dysfunction by echo in 6/24.    Admitted for elective triple vale surgery + MAZE. S/p bioprosthetic aortic valve replacement + mitral and tricuspid valve repairs. Post-op course c/b persistent vasoplagic syndrome.   Assessment/Plan   1. Post-op Vasoplegia/high-output HF - Echo 06/29/23 EF 60-65%, no RWMA, small pericardial effusion present, MV repaired/replaced, trivial MR. TR repaired/replaced, mild TR. AV repaired/replaced.  - Echo 07-07-23: EF 70-75%, severe concentric LVH, RV mod reduced, RV severely enlarged. LA/RA mod dilated. Large pericardial effusion. No evidence of tamponade - Echo 08/29: Moderate pericardial effusion, no tamponade - RHC c/w high output HF physiology 2/2 AV fistula despite severe RV dysfunction  - s/p AVF ligation on 9/6 - Echo (9/9) with EF 60-65%, moderate RV dysfunction/severe RV enlargement, D-shaped septum, large pericardial effusion without tamponade, stable bioprosthetic AVR, s/p MV repair with mean gradient 8, s/p TV  repair.    2.  Acute on Chronic Systolic Heart Failure w/ Prominent RV Dysfunction  - LVEF once 45-50% in 2013 but normal on subsequent echos. Pre-op TTE 6/25 EF 55-60%, GIIIDD (Restrictive) RV mildly reduced, mod PH w/ estimated RVSP 48 mmHg  - Intra-op TEE (while on 4 mcg of Epi) LVEF 45%, RV mod-severely reduced, stable AV prosthesis w/ normal gradient and stable valvuloplasty rings w/ mod residual MS, mean gradient 7 mmgH.   - echo Jul 07, 2023: EF 70-75%, severe concentric LVH, RV mod reduced, RV severely enlarged. LA/RA mod dilated. Large pericardial effusion. No evidence of tamponade.  - Echo 08/29: EF 65-70%, RV moderately reduced, pericardial effusion slightly smaller, no tamponade - RHC c/w high output HF physiology with very high pulmonary pressures 2/2 AV fistula despite severe RV dysfunction - s/p Fistula ligation 9/6   - Echo (9/9) with EF 60-65%, moderate RV dysfunction/severe RV enlargement, D-shaped septum, large pericardial effusion without tamponade, stable bioprosthetic AVR, s/p MV repair with mean gradient 8, s/p TV repair.   9/16 RHC showed predominantly RV failure but with adequate PAPi. PA pressure is moderately elevated, lower than prior. Cardiac output is also mildly lower s/p ligation of AV fistula.  - Echo (9/17) with  LV EF 60-65%, moderate LVH, D-shaped septum, severe RV enlargement with severely decreased systolic function, IVC dilated, bioprosthetic aortic valve stable, MV repair with no MR/mean gradient 8, s/p TV repair.  -Off Epi. Hypotensive this morning. Vasopressin increased to 0.03 units.  - On CRRT tolerating 50-100  removal.   Also on midodrine 30 tid + droxidopa.    3. Valvular Heart Disease  - pre-op diagnosis, severe AS, severe MR 2/2 MVP, severe TR - s/p bioprosthetic AVR, mitral and tricuspid valve repairs - stable gradients on intra-op TEE post repairs - stable on echo 8/22 and July 07, 2023 - stable on echo 9/9, mod MS mG 8.3 mmHg   4. Post-op CHB  - EP  following. - S/p RIJ temp wire 07-Jul-2023 - EPW left in place d/t concern for risk of tamponade - V paced 80.  Still has TTVP, with CHB.  - Needs to tolerate iHD before device   5. ESRD on HD  - s/p failed kidney transplants in 1999 and again in 2015 - Has failed iHD so far and remains on pressors, getting CVVH.   6. H/o Atrial Fibrillation  - S/p MAZE   7. Large pericardial effusion - seen on  echo 06/08/2023. Previously small on echo 06/29/23 - no evidence of tamponade - Limited echo 08/29 stable - Echo 9/9, large pericardial effusion but no tamponade.  8. Severe PAH - pre-op PA pressures 58/30. With PVR 1.2 CO 10L  - post-op PA 97/33 (55) PCWP 30 PVR 4.2 CO 7L - suspect combination of valvular heart disease and high-output physiology - should improved with AVF ligation -  RHC  PA 65/25 (41), PCWP 15 PVR 4.1  9. GOC - palliative care initially following. Now signed off 9/2 per patient request - remains full code - If unable to tolerate iHD will need Hospice.  10. FEN - Getting tube feeds. - Remains NPO with paralyzed vocal cord and aspiration risk.   Length of Stay: 34  [name] Clegg, NP  07/26/2023, 8:41 AM  Advanced Heart Failure Team Pager [phone] (M-F; 7a - 5p)  Please contact [facility] for night-coverage after hours (5p -7a ) and weekends on [email or web address]  Patient seen with NP, agree with the above note.   He was started back on vasopressin 0.03 due to hypotension.  Currently on CVVH, pulling net negative 100 cc/hr.  He remains on midodrine and droxidopa. I measure his CVP at 10.   No complaints.   General: NAD, frail.  Neck: JVP 9-10 cm, no thyromegaly or thyroid nodule.  Lungs: Clear to auscultation bilaterally with normal respiratory effort. CV: Nondisplaced PMI.  Heart regular S1/S2, no S3/S4, no murmur.  No peripheral edema.   Abdomen: Soft, nontender, no hepatosplenomegaly, no distention.  Skin: Intact without lesions or rashes.  Neurologic: Alert and oriented x 3.   Psych: Normal affect. Extremities: No clubbing or cyanosis.  HEENT: Normal.   Severe RV failure by last echo, has not improved.  LV systolic function normal. Last RHC showed elevated right-sided filling pressures but adequate PAPi.   CVP down to 10 on my read.  Would cut back on CVVH, aim for no more than 50-100 cc/hr net negative.  Today, need to work on slowly weaning down on vasopressin for SBP > 85. - Once off vasopressin, will need to try to make transition to iHD again.  If he cannot tolerate iHD this time, will need strong consideration of comfort care transition.  CRITICAL CARE Performed by: Marca Ancona  Total critical care time: 35 minutes  Critical care time was exclusive of separately billable procedures and treating other patients.  Critical care was necessary to treat or prevent imminent or life-threatening deterioration.  Critical care was time spent personally by me on the following activities: development of treatment plan with patient and/or surrogate as well as nursing, discussions with consultants, evaluation of patient's response to treatment, examination of patient, obtaining history from patient or surrogate, ordering and performing treatments and interventions, ordering and review of laboratory studies, ordering and review of radiographic studies, pulse oximetry and re-evaluation of patient's condition.   Marca Ancona 07/26/2023 10:42 AM

## 2023-07-26 NOTE — Progress Notes (Signed)
[facility] Progress Note  Subjective: Seen in room. Per RN they had to stop pulling fluid overnight due to BP's dropping. Vaso gtt had to be restarted.   Vitals:   07/26/23 0700 07/26/23 0739 07/26/23 0800 07/26/23 0900  BP: (!) 111/47  (!) 98/50 (!) 99/41  Pulse: 81  83 82  Resp: (!) 24  20 (!) 26  Temp:  98 F (36.7 C)    TempSrc:  Oral    SpO2: 100%  99% 100%  Weight:      Height:        Physical Exam:       General adult male in bed Lungs clear but reduced to auscultation bilaterally  Heart S1S2 Abdomen soft nontender nondistended Extremities no edema appreciated Neuro - resting, somnolent, awakens easily Access left internal jugular tunneled dialysis catheter    OP HD: [facility] triad MWF 3rd shift 3h    F250     94kg   2K/2.5 Ca bath   RUE AVF    Summary: [name] is a/an 52 y.o. 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 Underwent aortic biovalve replacement, TV repair, MV repari, bilat Maze procedures.    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    # Complete heart block - post-op, per EP/ cardiology, still with temp pacer but plans for permanent option in process pending stable status  # Possible high-output heart failure - per CHF. SP AVF ligation 9/06 by VVS.  Right heart catheterization today  # ESRD - pt was too hypotensive for hemodialysis post-op.  CRRT started 8/16 and stopped 07/19/23.  Received HD 9/13 but limited by UF and returned to pressors thereafter.  Resumed CRRT 9/14. AHF assisting w/ UF goals.   # HD access - AVF ligated for CHF. Now s/p [facility] 07/19/23 with IR. Working well.    # Distributive shock - remains on high dose midodrine 30 tid, down to vaso gtt at low dose. Droxidopa started at 100mg  tid.   # pHTN - severe by RHC --> Off NO2 now.  Per CHF team.   # Volume - improved, wt's dropped again now down to 74kg (down 20kg). Defer to CHF team re: UF goals.   # Pericardial effusion - per CT surgery and CHF.  We are optimizing volume status with CRRT as above.  Assessed as moderate on last TTE   # Anemia of ESRD - transfuse as needed per primary team. Aranesp increased to 150 mcg weekly on Tuesdays    # MBD ckd -  Phos acceptable.  Calcium as below.  No binders currently as on CRRT - normally on renvela.    # Hypercalcemia -worsened after discontinuation of CRRT. Now has received calcitonin and  pamidronate.  Resuming CRRT helping. Still likely due to immobility. Improved  # Progressive frailty and FTT    Maree Krabbe, MD  07/26/2023, 10:29 AM  Recent Labs  Lab 07/25/23 1516 07/26/23 0420  HGB 8.4* 8.2*  ALBUMIN 2.8* 2.6*  CALCIUM 9.4 9.1  PHOS 2.0* 1.9*  CREATININE 1.95* 1.75*  K 5.0 5.0    Inpatient medications:  acyclovir  200 mg Per Tube BID   aspirin  81 mg Per Tube Daily   atorvastatin  10 mg Per  Tube QHS   azelastine  2 spray Each Nare BID   Chlorhexidine Gluconate Cloth  6 each Topical Daily   darbepoetin (ARANESP) injection - DIALYSIS  150 mcg Subcutaneous Q Tue-1800   droxidopa  100 mg Oral TID WC   escitalopram  10 mg Per Tube Daily   feeding supplement (PROSource TF20)  60 mL Per Tube Q4H   folic acid  1 mg Per Tube Daily   Gerhardt's butt cream   Topical Daily   guaiFENesin  15 mL Per Tube BID   heparin flush  10 Units Intracatheter Once   heparin  5,000 Units Subcutaneous Q8H   influenza vac split trivalent PF  0.5 mL Intramuscular Once   lidocaine-EPINEPHrine  20 mL Other Once   midodrine  30 mg Per Tube TID   multivitamin  1 tablet Per Tube BID   mouth rinse  15 mL Mouth Rinse 4 times per day   pantoprazole (PROTONIX) IV  40 mg Intravenous Q12H   sodium chloride flush  3 mL Intravenous Q12H   sodium chloride flush  3 mL Intravenous Q12H   thiamine  100 mg Per Tube Daily      prismasol BGK 4/2.5 600 mL/hr at 07/25/23 2239    prismasol BGK 4/2.5 400 mL/hr at 07/26/23 0546   sodium chloride 10 mL/hr at 07/26/23 0900   sodium chloride     albumin human Stopped (06/23/23 0040)   epinephrine Stopped (07/25/23 2033)   feeding supplement (VITAL 1.5 CAL) 60 mL/hr at 07/26/23 0900   prismasol BGK 4/2.5 1,500 mL/hr at 07/26/23 0904   sodium phosphate 15 mmol in dextrose 5 % 250 mL infusion 15 mmol (07/26/23 1028)   vasopressin 0.03 Units/min (07/26/23 0900)   sodium chloride, sodium chloride, acetaminophen (TYLENOL) oral liquid 160 mg/5 mL, albumin human, bisacodyl **OR** bisacodyl, clonazepam, dextrose, docusate, heparin, loperamide HCl, ondansetron (ZOFRAN) IV, oxyCODONE, phenol, polyethylene glycol, simethicone, sodium chloride, sodium chloride flush, sodium chloride flush

## 2023-07-26 NOTE — Telephone Encounter (Signed)
Pharmacy Patient Advocate Encounter  Received notification from [facility] that Prior Authorization for Droxidopa 100MG  capsules  has been APPROVED from 07/26/2023 to 11/07/2023. Ran test claim, Copay is $95.00. This test claim was processed through [facility]- copay amounts may vary at other pharmacies due to pharmacy/plan contracts, or as the patient moves through the different stages of their insurance plan.   PA #/Case ID/Reference #: [id number]

## 2023-07-27 ENCOUNTER — Inpatient Hospital Stay (HOSPITAL_COMMUNITY): Payer: Medicare HMO

## 2023-07-27 DIAGNOSIS — I340 Nonrheumatic mitral (valve) insufficiency: Secondary | ICD-10-CM | POA: Diagnosis not present

## 2023-07-27 DIAGNOSIS — Z515 Encounter for palliative care: Secondary | ICD-10-CM | POA: Diagnosis not present

## 2023-07-27 DIAGNOSIS — I4891 Unspecified atrial fibrillation: Secondary | ICD-10-CM

## 2023-07-27 DIAGNOSIS — Z9889 Other specified postprocedural states: Secondary | ICD-10-CM | POA: Diagnosis not present

## 2023-07-27 DIAGNOSIS — Z7189 Other specified counseling: Secondary | ICD-10-CM | POA: Diagnosis not present

## 2023-07-27 DIAGNOSIS — I5023 Acute on chronic systolic (congestive) heart failure: Secondary | ICD-10-CM | POA: Diagnosis not present

## 2023-07-27 LAB — RENAL FUNCTION PANEL
Albumin: 2.7 g/dL — ABNORMAL LOW (ref 3.5–5.0)
Anion gap: 4 — ABNORMAL LOW (ref 5–15)
BUN: 30 mg/dL — ABNORMAL HIGH (ref 6–20)
CO2: 26 mmol/L (ref 22–32)
Calcium: 8.9 mg/dL (ref 8.9–10.3)
Chloride: 102 mmol/L (ref 98–111)
Creatinine, Ser: 1.68 mg/dL — ABNORMAL HIGH (ref 0.61–1.24)
GFR, Estimated: 49 mL/min — ABNORMAL LOW (ref >60)
Glucose, Bld: 117 mg/dL — ABNORMAL HIGH (ref 70–99)
Phosphorus: 2.1 mg/dL — ABNORMAL LOW (ref 2.5–4.6)
Potassium: 4.9 mmol/L (ref 3.5–5.1)
Sodium: 132 mmol/L — ABNORMAL LOW (ref 135–145)

## 2023-07-27 LAB — GLUCOSE, CAPILLARY
Glucose-Capillary: 111 mg/dL — ABNORMAL HIGH (ref 70–99)
Glucose-Capillary: 114 mg/dL — ABNORMAL HIGH (ref 70–99)
Glucose-Capillary: 116 mg/dL — ABNORMAL HIGH (ref 70–99)
Glucose-Capillary: 117 mg/dL — ABNORMAL HIGH (ref 70–99)
Glucose-Capillary: 130 mg/dL — ABNORMAL HIGH (ref 70–99)
Glucose-Capillary: 89 mg/dL (ref 70–99)

## 2023-07-27 LAB — CBC
HCT: 28.6 % — ABNORMAL LOW (ref 39.0–52.0)
Hemoglobin: 8.4 g/dL — ABNORMAL LOW (ref 13.0–17.0)
MCH: 31.6 pg (ref 26.0–34.0)
MCHC: 29.4 g/dL — ABNORMAL LOW (ref 30.0–36.0)
MCV: 107.5 fL — ABNORMAL HIGH (ref 80.0–100.0)
Platelets: 250 10*3/uL (ref 150–400)
RBC: 2.66 MIL/uL — ABNORMAL LOW (ref 4.22–5.81)
RDW: 20.5 % — ABNORMAL HIGH (ref 11.5–15.5)
WBC: 7.3 10*3/uL (ref 4.0–10.5)
nRBC: 0.6 % — ABNORMAL HIGH (ref 0.0–0.2)

## 2023-07-27 LAB — MISC LABCORP TEST (SEND OUT): Labcorp test code: 4051

## 2023-07-27 LAB — COOXEMETRY PANEL
Carboxyhemoglobin: 2.3 % — ABNORMAL HIGH (ref 0.5–1.5)
Methemoglobin: <0.7 % (ref 0.0–1.5)
O2 Saturation: 73.2 %
Total hemoglobin: 9 g/dL — ABNORMAL LOW (ref 12.0–16.0)

## 2023-07-27 LAB — MAGNESIUM: Magnesium: 2.6 mg/dL — ABNORMAL HIGH (ref 1.7–2.4)

## 2023-07-27 MED ORDER — SODIUM CHLORIDE 0.9 % IV SOLN: INTRAVENOUS | Status: DC | PRN

## 2023-07-27 NOTE — Progress Notes (Signed)
NAME:  [name], MRN:  [id number], DOB:  09-21-1971, LOS: 35 ADMISSION DATE:  06/20/2023, CONSULTATION DATE: 06/19/2023 REFERRING MD: Leafy Ro - TCTS, CHIEF COMPLAINT: Postcardiac surgery  History of Present Illness:  This is a 52 year old gentleman, past medical history of anxiety depression, bipolar disease, congestive heart failure chronic diastolic heart failure kidney disease followed by renal transplant in 10-23-98 repeat renal transplant in 2014-10-23.  Patient underwent surgery today for severe mitral regurgitation severe tricuspid regurgitation and moderate aortic stenosis.  He also has atrial fibrillation received maze procedure.  He had a complex mitral valve repair a tricuspid valve annuloplasty and aortic valve replacement followed by full right and left maze procedure.  Pertinent Medical History:   Past Medical History:  Diagnosis Date   ADHD (attention deficit hyperactivity disorder)    Anemia    Anemia, chronic disease 07/13/2011   Anxiety    Arthritis    Bipolar 2 disorder (HCC) 08/18/2011   Diagnosed in 10-23-10.    Bipolar disorder (HCC)    Blood dyscrasia    Nephrotic Anemia   Blood transfusion    Blood transfusion without reported diagnosis    Chronic diastolic CHF (congestive heart failure) (HCC)    Complication of anesthesia    Woke up intubated gets combative  afraid to be alone    Congestive heart failure (CHF) (HCC) 07/13/2011   Onset 10/23/2005.  Followed by Dr. Eden Emms.  S/p cardiac catheterization in [facility] Dr. Anne Fu..  S/p cardiac catheterization in 10-23-2013 by Nishan.  Echo 10/23/2013.    Coronary artery disease    Depression    Dysrhythmia    A. Fib 10/24/23 with DCCV in May 2024   Erectile dysfunction    ESRD on hemodialysis (HCC) 07/13/2011   Glomerulonephritis at age 6, started HD in 10/24/95.  Deceased donor renal transplant October 23, 1998 at [facility], then kidney failed and went back on HD in 2005-10-23.  L forearm AVF . s/p repeat renal transplant 10/2014 [facility].   GI bleed 11/01/2011   Rectal  bleeding with emesis/diarrhea    Heart murmur    Hemodialysis status (HCC)    HSV (herpes simplex virus) infection    Hydrocele, right 11/25/2014   Hypertension    Hypertensive emergency 01/06/2013   Hypoglycemia 01/11/2013   Influenza-like illness 12/18/2013   Mild ascending aorta dilatation (HCC)    NICM (nonischemic cardiomyopathy) (HCC)    Pericardial effusion    a. Mod by echo in 10/23/2013, similar to prior.   Peripheral polyneuropathy 08/04/2016   Pneumonia    Pulmonary edema 01/06/2013   Pulmonary hypertension associated with end stage renal disease on dialysis [facility]) 07/13/2011   Renal transplant, status post 11/25/2014   Pt with Glomerulonephritis at age 60. Deceased donor renal transplant October 23, 1998 right and left renal transplant 10/2014.  Followed by [facility] transplant team; nephrologist is Dr. Lonna Cobb.    Respiratory failure with hypoxia (HCC) 01/06/2013   Shortness of breath 12/12/2012   Sleep apnea    Swollen testicle 07/13/2011   URI (upper respiratory infection) 01/25/2012    Significant Hospital Events: Including procedures, antibiotic start and stop dates in addition to other pertinent events   8/15 - AVR, MVR, TVR with TCTS ([facility]). Remains on multiple pressors (NE, Epi, vaso). 8/16 - LIJ Trialysis catheter placed for CRRT.  8/17 extubated  8/18 - remains on CVVHD, 3 pressors  8/19-8/20 improving pressor needs 8/21 agitated delirium; cortrak 8/26 CRRT restarted 8/27 TVP placed, brady overnight and pacing turned to 80 8/30 palli consult  9/1 PRBC for low coox  9/2 coox down a bit again  9/3CRRT off overnight due to access issues. Remains on vaso 0.04, epi 10, norepi 2, DBA 5. 9/4 RHC, started iNO 9/6 repeat RHC, fistula ligated 9/9 Remains on vaso 0.04, epi . 30 midodrine TID, Temp pacer, small effusions on CXR , mild interstitial edema, left lower lobe consolidation most likely atelectasis , afebrile 9/10 intermittent vasopressor. Echo continue to show  severe RV dysfunction. 9/11 [facility] cvc to fem site in prep for possible temp perm  9/12 intolerant of EN, vomiting.  Aug 14, 2023 right heart cath:   RHC Procedural Findings (epinephrine 1, vasopressin 0.02): Hemodynamics (mmHg) RA mean 15 RV 64/17 PA 65/25, mean 41 PCWP mean 15 Oxygen saturations: PA 55% AO 94% Cardiac Output (Fick) 6.35  Cardiac Index (Fick) 3.16  Cardiac Output (Thermo) 6.39  Cardiac Index (Thermo) 3.17 PVR 4.1 WU  9/17 Assessed by SLP. Still not ready for swallow eval. Epinephrine off added droxidopa    Interim History / Subjective:  Remains on CRRT On 0.04 vaso; bp soft  Objective:  Blood pressure (!) 80/42, pulse 82, temperature 97.9 F (36.6 C), temperature source Oral, resp. rate (!) 22, height 6\' 3"  (1.905 m), weight 74.2 kg, SpO2 99%. CVP:  [2 mmHg-18 mmHg] 9 mmHg      Intake/Output Summary (Last 24 hours) at 07/27/2023 1007 Last data filed at 07/27/2023 0700 Gross per 24 hour  Intake 1842.47 ml  Output 2900 ml  Net -1057.53 ml   Filed Weights   07/25/23 0354 07/26/23 0500 07/27/23 0500  Weight: 73.7 kg 73.8 kg 74.2 kg   Physical Examination:  General:   ill appearing male in NAD HEENT: MM pink/moist Neuro: Aox3 CV: s1s2, paced 80s, no m/r/g PULM:  dim clear BS bilaterally; RA GI: soft, bsx4 active  Extremities: warm/dry, no edema  Skin: no rashes or lesions    Resolved problem list   Acute resp failure w hypoxia and hypercarbia Pericardial effusion without tamponade   Assessment & Plan:   Cardiogenic shock due to biventricular HF  w/ refractory vasoplegia  s/p MVR TVR AS s/p bioprosthetic replacements & MAZE procedure  c/b CHB, severe pHTN  Cardiogenic shock, improved a bit after fistula closure Plan: -cont vaso for SBP goal >90 -cont midodrine and droxidopa -plan to hold CRRT today -temp pacer currently in place; at some point will need perm pacemaker -will reconsult palliative care as overall prognosis poor  ESRD on iHD CKD s/p  failed Rtxp 1999, 2015  Plan: -nephro following -plan to hold CRRT today -Trend BMP / urinary output -Replace electrolytes as indicated -Avoid nephrotoxic agents, ensure adequate renal perfusion  H/o HSV Plan: -acyclovir  Anemia, multifactorial Plan: -trend cbc  Severe kcal/protein malnutrition EN intolerance w/ nausea and vomiting Seems to be exacerbated in low flow state & w/ volume up> N/V.  Improved today  Plan: -cont TF -SLP following  Hypercalcemia- immobility probably primary driving factor Plan: -trend bmp -cont TF  Physical deconditioning due to prolonged critical illness + significant chronic dz burden, dysphagia  -SLP note reports left vocal cord immobile and in fixed position.  Plan: -PT/OT/SLP   Best Practice (right click and "Reselect all SmartList Selections" daily)   Diet/type:TF  DVT prophylaxis: heparin sq GI prophylaxis: PPI Lines: Central line and Dialysis Catheter Foley:  No  Code Status:  full code Last date of multidisciplinary goals of care discussion per Primary Team; will consult Palliative care given overall poor prognosis  My time  35 min   JD Anselm Lis [facility] 07/27/2023, 10:21 AM  Please see [email or web address] for pager details.  From 7A-7P if no response, please call [phone]. After hours, please call ELink [phone].

## 2023-07-27 NOTE — Progress Notes (Signed)
Modified Barium Swallow Study  Patient Details  Name: [name] MRN: [id number] Date of Birth: 1970-12-23  Today's Date: 07/27/2023  Modified Barium Swallow completed.  Full report located under Chart Review in the Imaging Section.  History of Present Illness This is a 52 year old gentleman, Patient underwent surgery 8/15  for severe mitral regurgitation severe tricuspid regurgitation and moderate aortic stenosis.  He also has atrial fibrillation received maze procedure.  He had a complex mitral valve repair a tricuspid valve annuloplasty and aortic valve replacement followed by full right and left maze procedure. Extubated 8/17, complicated course since extubation with decreased LOC, need for CRRT, FEES x2 with second test showing Left vocal fold fixedin paramedian position. Past medical history of tracheostomy in 2011 (reason unknown, pt reports "real bad pna"), anxiety depression, bipolar disease, congestive heart failure chronic diastolic heart failure kidney disease followed by renal transplant in 1999 repeat renal transplant in 2015.   Clinical Impression Pt has a moderate oropharyngeal dysphagia characterized by pharyngeal weakness and severe general deconditioning with worsening function as pt fatiuged with the effort of swallowing and protecting airway.   Pt has good oral function, but poor epiglottic deflection, reduce base of tongue retraction and weak upper pharyngeal constrictors leaving moderate to severe residue in the upper pharynx and valleculae. The vestibule is partially unprotected given no epiglottic deflection resulting in penetration. Despite focal fold paresis he did initially have glottic closure and no aspiration with nectar. Pt senses penetrate and residue and coughs spontaneously to eject and executes 5-10 swallows for each teaspoon in attempts to clear residue. The effort immediately fatuged him. He eventually needed to rest and couldnt hold his head up. SLP attempted  effortful swallow, head turn and chin tuck without improvement.  Then teaspoons of thin liquids were given after nectar and pt had immediate sensed aspiration and was too weak to eject aspirate which accumulated from residue as well. pt could have 3-4 teaspoons of nectar thick liquids (tea, broth) from a liquid diet, but would not be able to nutritionally sustain himself orally and would need significant gains in strength and endurance to meaningfully improve swallowing function. Factors that may increase risk of adverse event in presence of aspiration [employer] 2021): Limited mobility;Frail or deconditioned;Dependence for feeding and/or oral hygiene;Inadequate oral hygiene;Weak cough;Poor general health and/or compromised immunity  Swallow Evaluation Recommendations Recommendations: PO diet PO Diet Recommendation: Full liquid diet;Mildly thick liquids (Level 2, nectar thick) Liquid Administration via: Spoon Medication Administration: Via alternative means Supervision: Full assist for feeding;Staff to assist with self-feeding Swallowing strategies  : Slow rate;Small bites/sips;Hard cough after swallowing Postural changes: Stay upright 30-60 min after meals;Position pt fully upright for meals Oral care recommendations: Oral care QID (4x/day) Recommended consults: Consider Palliative care      [name] 07/27/2023,2:07 PM

## 2023-07-27 NOTE — Progress Notes (Signed)
Physical Therapy Treatment Patient Details Name: [name] MRN: [id number] DOB: [birth year] Today's Date: 07/27/2023   History of Present Illness Pt is a 52 y.o. male admitted 06/29/2023 for same day planned tricuspid valve repair, mitral valve repair, aortic valve replacement, maze, TEE. Post-op course complicated by CHB, shock, AKI, respiratory failure. CRRT initiated 8/16, then resumed 8/26. ETT 8/15-8/17. RHC 9/4 and 9/6. PMH includes CHF, HTN, NICM, polyneuropathy, ESRD on HD (s/p renal transplant 1999, 2015), bipolar, ADHD, anxiety, depression.  CRRT stopped 9/19.    PT Comments  Pt starting to show some progression toward goals, still limited by significant weakness and deconditioning as well as some sternal pain and limitations from precautions.  Emphasis on transitions, scooting and sit to stands without UE's and pre-gait activity in the EVA walker with min/mod assist of 2 persons with sidestepping 3 feet up toward [facility] with Mod +2 in the EVA walker.     If plan is discharge home, recommend the following: A lot of help with walking and/or transfers;A lot of help with bathing/dressing/bathroom;Assistance with cooking/housework;Assist for transportation;Help with stairs or ramp for entrance   Can travel by private vehicle        Equipment Recommendations  Other (comment)    Recommendations for Other Services       Precautions / Restrictions Precautions Precautions: Fall;Sternal Precaution Booklet Issued: No Precaution Comments: External pacer in R neck Restrictions Weight Bearing Restrictions: Yes RUE Weight Bearing: Touch down weight bearing LUE Weight Bearing: Touch down weight bearing Other Position/Activity Restrictions: sternal prec     Mobility  Bed Mobility Overal bed mobility: Needs Assistance Bed Mobility: Supine to Sit     Supine to sit: Mod assist, +2 for physical assistance Sit to supine: Mod assist, +2 for safety/equipment   General bed mobility comments:  cues for technique,  Pt able to get LE's to EOB, but needed truncal assist to come up and forward due to no ability to build momentum at this time.  LE assist to sidelying.    Transfers Overall transfer level: Needs assistance Equipment used: Bilateral platform walker Transfers: Sit to/from Stand Sit to Stand: Mod assist, +2 physical assistance           General transfer comment: w/shift--unweighting support.    Ambulation/Gait Ambulation/Gait assistance: Mod assist, +2 physical assistance Gait Distance (Feet): 3 Feet           General Gait Details: sidestepping toward [facility] with mod assist +2 safety with w/shift /stability support.   Stairs             Wheelchair Mobility     Tilt Bed    Modified Rankin (Stroke Patients Only)       Balance Overall balance assessment: Needs assistance Sitting-balance support: No upper extremity supported, Feet supported Sitting balance-Leahy Scale: Fair     Standing balance support: Reliant on assistive device for balance Standing balance-Leahy Scale: Poor                              Cognition Arousal: Alert Behavior During Therapy: Flat affect Overall Cognitive Status: Within Functional Limits for tasks assessed                                          Exercises Other Exercises Other Exercises: hip/knee flexion/ext ROM with graded assist in flex and  graded resistance in ext x10 reps bil.    General Comments General comments (skin integrity, edema, etc.): vss on RA with external pacing      Pertinent Vitals/Pain Pain Assessment Pain Assessment: Faces Faces Pain Scale: Hurts little more Pain Location: R knee Pain Descriptors / Indicators: Discomfort, Grimacing Pain Intervention(s): Monitored during session    Home Living                          Prior Function            PT Goals (current goals can now be found in the care plan section) Acute Rehab PT Goals PT  Goal Formulation: With patient Time For Goal Achievement: 08/10/23 Potential to Achieve Goals: Fair Progress towards PT goals: Progressing toward goals    Frequency    Min 1X/week      PT Plan      Co-evaluation PT/OT/SLP Co-Evaluation/Treatment: Yes Reason for Co-Treatment: Complexity of the patient's impairments (multi-system involvement);For patient/therapist safety PT goals addressed during session: Mobility/safety with mobility OT goals addressed during session: ADL's and self-care      AM-PAC PT "6 Clicks" Mobility   Outcome Measure                   End of Session   Activity Tolerance: Patient limited by fatigue Patient left: in bed;with call bell/phone within reach;with bed alarm set;with nursing/sitter in room Nurse Communication: Mobility status PT Visit Diagnosis: Other abnormalities of gait and mobility (R26.89);Muscle weakness (generalized) (M62.81)     Time: 6270-3500 PT Time Calculation (min) (ACUTE ONLY): 24 min  Charges:    $Therapeutic Activity: 8-22 mins PT General Charges $$ ACUTE PT VISIT: 1 Visit                     07/27/2023  [name]., PT Acute Rehabilitation Services [phone]  (office)   [name] 07/27/2023, 3:31 PM

## 2023-07-27 NOTE — Progress Notes (Signed)
Occupational Therapy Treatment Patient Details Name: [name] MRN: [id number] DOB: Mar 23, 1971 Today's Date: 07/27/2023   History of present illness Pt is a 52 y.o. male admitted 06/10/2023 for same day planned tricuspid valve repair, mitral valve repair, aortic valve replacement, maze, TEE. Post-op course complicated by CHB, shock, AKI, respiratory failure. CRRT initiated 8/16, then resumed 8/26. ETT 8/15-8/17. RHC 9/4 and 9/6. PMH includes CHF, HTN, NICM, polyneuropathy, ESRD on HD (s/p renal transplant 1999, 2015), bipolar, ADHD, anxiety, depression.  CRRT stopped 9/19.   OT comments  Patient off CRRT, external defibrillator remains.  Able to progress to EOB sitting for grooming CGA, and stood times two with Mod A of 2 to Fara Boros.  OT to continue efforts in the acute setting to maximize functional status and assist with Patient potential transition to intensive inpatient follow up therapy, >3 hours/day        If plan is discharge home, recommend the following:  Two people to help with walking and/or transfers;A lot of help with bathing/dressing/bathroom;Assistance with cooking/housework;Direct supervision/assist for financial management;Direct supervision/assist for medications management;Assist for transportation;Help with stairs or ramp for entrance   Equipment Recommendations       Recommendations for Other Services      Precautions / Restrictions Precautions Precautions: Fall;Sternal Precaution Booklet Issued: No Precaution Comments: External pacer in R neck Restrictions Weight Bearing Restrictions: Yes RUE Weight Bearing: Touch down weight bearing LUE Weight Bearing: Touch down weight bearing Other Position/Activity Restrictions: sternal prec       Mobility Bed Mobility Overal bed mobility: Needs Assistance Bed Mobility: Supine to Sit     Supine to sit: Mod assist, +2 for physical assistance          Transfers Overall transfer level: Needs assistance Equipment  used: Bilateral platform walker Transfers: Sit to/from Stand Sit to Stand: Mod assist, +2 physical assistance                 Balance Overall balance assessment: Needs assistance Sitting-balance support: No upper extremity supported, Feet supported Sitting balance-Leahy Scale: Fair     Standing balance support: Reliant on assistive device for balance Standing balance-Leahy Scale: Poor                             ADL either performed or assessed with clinical judgement   ADL Overall ADL's : Needs assistance/impaired     Grooming: Sitting;Contact guard assist;Oral care   Upper Body Bathing: Minimal assistance;Sitting   Lower Body Bathing: Maximal assistance;Bed level   Upper Body Dressing : Moderate assistance;Sitting   Lower Body Dressing: Maximal assistance;Bed level   Toilet Transfer: Moderate assistance;+2 for physical assistance                  Extremity/Trunk Assessment Upper Extremity Assessment Upper Extremity Assessment: Generalized weakness   Lower Extremity Assessment Lower Extremity Assessment: Defer to PT evaluation   Cervical / Trunk Assessment Cervical / Trunk Assessment: Kyphotic    Vision   Vision Assessment?: No apparent visual deficits   Perception Perception Perception: Not tested   Praxis Praxis Praxis: Not tested    Cognition Arousal: Alert Behavior During Therapy: Flat affect Overall Cognitive Status: Within Functional Limits for tasks assessed  General Comments  Soft BP    Pertinent Vitals/ Pain       Pain Assessment Pain Assessment: Faces Faces Pain Scale: Hurts little more Pain Location: R knee Pain Descriptors / Indicators: Discomfort, Grimacing Pain Intervention(s): Monitored during session                                                          Frequency  Min 1X/week        Progress  Toward Goals  OT Goals(current goals can now be found in the care plan section)  Progress towards OT goals: Progressing toward goals  Acute Rehab OT Goals OT Goal Formulation: With patient Time For Goal Achievement: 08/07/23 Potential to Achieve Goals: Fair  Plan      Co-evaluation    PT/OT/SLP Co-Evaluation/Treatment: Yes Reason for Co-Treatment: Complexity of the patient's impairments (multi-system involvement);For patient/therapist safety   OT goals addressed during session: ADL's and self-care      AM-PAC OT "6 Clicks" Daily Activity     Outcome Measure   Help from another person eating meals?: A Little Help from another person taking care of personal grooming?: A Little Help from another person toileting, which includes using toliet, bedpan, or urinal?: A Lot Help from another person bathing (including washing, rinsing, drying)?: A Lot Help from another person to put on and taking off regular upper body clothing?: A Lot Help from another person to put on and taking off regular lower body clothing?: A Lot 6 Click Score: 14    End of Session    OT Visit Diagnosis: Unsteadiness on feet (R26.81);Other abnormalities of gait and mobility (R26.89);Muscle weakness (generalized) (M62.81)   Activity Tolerance Patient tolerated treatment well   Patient Left in bed;with call bell/phone within reach   Nurse Communication Mobility status        Time: 1610-9604 OT Time Calculation (min): 24 min  Charges: OT General Charges $OT Visit: 1 Visit OT Treatments $Self Care/Home Management : 8-22 mins  07/27/2023  RP, OTR/L  Acute Rehabilitation Services  Office:  [phone]   Suzanna Obey 07/27/2023, 3:09 PM

## 2023-07-27 NOTE — Progress Notes (Signed)
[address]       [place] [address]             [phone]      3 Days Post-Op  Procedure(s) (LRB): RIGHT HEART CATH (N/A)   Total Length of Stay:  LOS: 35 days    SUBJECTIVE: Very awake. When sleeps gets hypotensive and vaso increases  Vitals:   07/27/23 0700 07/27/23 0715  BP: (!) 88/44 (!) 92/56  Pulse: 84 82  Resp: (!) 25 16  Temp:    SpO2: 97% 100%    Intake/Output      09/18 0701 09/19 0700 09/19 0701 09/20 0700   I.V. (mL/kg) 382.7 (5.2)    Other 100    NG/GT 1440    IV Piggyback 254.7    Total Intake(mL/kg) 2177.4 (29.3)    Stool     CRRT 3383    Total Output 3383    Net -1205.7              prismasol BGK 4/2.5 600 mL/hr at 07/27/23 0039    prismasol BGK 4/2.5 400 mL/hr at 07/26/23 0546   sodium chloride 10 mL/hr at 07/27/23 0700   sodium chloride     albumin human Stopped (06/23/23 0040)   epinephrine Stopped (07/25/23 2033)   feeding supplement (VITAL 1.5 CAL) 60 mL/hr at 07/27/23 0700   prismasol BGK 4/2.5 1,500 mL/hr at 07/27/23 0552   vasopressin 0.03 Units/min (07/27/23 0700)    CBC    Component Value Date/Time   WBC 7.3 07/27/2023 0558   RBC 2.66 (L) 07/27/2023 0558   HGB 8.4 (L) 07/27/2023 0558   HCT 28.6 (L) 07/27/2023 0558   PLT 250 07/27/2023 0558   MCV 107.5 (H) 07/27/2023 0558   MCH 31.6 07/27/2023 0558   MCHC 29.4 (L) 07/27/2023 0558   RDW 20.5 (H) 07/27/2023 0558   LYMPHSABS 1.6 07/18/2023 0423   MONOABS 1.3 (H) 07/18/2023 0423   EOSABS 0.4 07/18/2023 0423   BASOSABS 0.1 07/18/2023 0423   CMP     Component Value Date/Time   NA 132 (L) 07/27/2023 0530   K 4.9 07/27/2023 0530   CL 102 07/27/2023 0530   CO2 26 07/27/2023 0530   GLUCOSE 117 (H) 07/27/2023 0530   BUN 30 (H) 07/27/2023 0530   CREATININE 1.68 (H) 07/27/2023 0530   CALCIUM 8.9 07/27/2023 0530   CALCIUM 10.4 (H) 07/23/2023 0457   PROT 8.9 (H) 07/18/2023 0423   ALBUMIN 2.7 (L) 07/27/2023 0530   AST 45 (H) 07/18/2023 0423    ALT 17 07/18/2023 0423   ALKPHOS 117 07/18/2023 0423   BILITOT 0.5 07/18/2023 0423   GFRNONAA 49 (L) 07/27/2023 0530   GFRAA 10 (L) 03/16/2020 0825   ABG    Component Value Date/Time   PHART 7.317 (L) 07/11/2023 1346   PCO2ART 48.2 (H) 07/11/2023 1346   PO2ART 103 07/11/2023 1346   HCO3 25.5 07/22/2023 0959   TCO2 27 07/21/2023 0959   ACIDBASEDEF 1.0 07/29/2023 1517   O2SAT 73.2 07/27/2023 0530   CBG (last 3)  Recent Labs    07/26/23 1943 07/26/23 2336 07/27/23 0322  GLUCAP 100* 96 116*     ASSESSMENT: SP MV and TV repair, AVR and maze Continues to require vaso for support. Discussed with pt the concern if cannot tolerate conventional dialysis that will have to consider hospice care For swallow study today OOB and aggressive PT   Eugenio Hoes, MD 07/27/2023

## 2023-07-27 NOTE — Progress Notes (Signed)
    PMT acknowledges re-consult for [facility] :goals of care discussion. PMT was initially consulted 8/29 and after a few visits, patient requested PMT no longer be involved in his care on 9/2. Goals of care were clear for full code/full scope at that time and family was supportive of his wishes.  Chart reviewed in detail. Discussed with PCCM PA. Poor prognosis remains a significant concern with lack of improvement and prolonged hospitalization.    Attempted to offer palliative services to patient but he was not present in the room. If patient is agreeable, PMT will re-attempt at a later time/date.  Thank you for your referral and allowing PMT to assist in Mr. [name]'s care.   Richardson Dopp, [facility] Palliative Medicine Team  Team Phone # [phone]   NO CHARGE

## 2023-07-27 NOTE — Progress Notes (Signed)
Patient ID: [name], male   DOB: Dec 07, 1970, 52 y.o.   MRN: [id number]     Advanced Heart Failure Rounding Note  PCP-Cardiologist: Reatha Harps, MD   Subjective:   8/15: Elective triple valve surgery + MAZE. Underwent bioprosthetic AVR + mitral and tricuspid valve repairs by Dr. Milinda Antis 8/22: AHF consulted for high-output HF with persistent post-op shock 8/25: CRRT stopped 8/26: CRRT restarted  8/27: S/p TTVP Echo: EF 70-75%, severe LVH, RV mod reduced, RV severely enlarged. LA/RA mod dilated. Large pericardial effusion. No evidence of tamponade.  9/4: RHC c/w severe PAH with normal output and low SVR. PVR 4-5. SBP improved nearly 20 points with AVF occlusion.  2023-07-23: Repeat RHC with subsequent ligation of AVF SVR ~500 despite high-dose pressors 9/9: Echo with EF 60-65%, moderate RV dysfunction/severe RV enlargement, D-shaped septum, large pericardial effusion without tamponade, stable bioprosthetic AVR, s/p MV repair with mean gradient 8, s/p TV repair.   9/16: RHC showed predominantly RV failure but with adequate PAPi. PA pressure is moderately elevated, lower than prior. Cardiac output is also mildly lower s/p ligation of AV fistula.  9/17: Droxidopa added  Vasopressin now at 0.04 units. CVP 5 this morning. Co-ox 73%.   Remains on CVVH, now running even.   He is RV paced via TTVP. Remains in CHB.    Denies SOB.   RHC 9/16: RHC Procedural Findings (epinephrine 1, vasopressin 0.02): Hemodynamics (mmHg) RA mean 15 RV 64/17 PA 65/25, mean 41 PCWP mean 15 Oxygen saturations: PA 55% AO 94% Cardiac Output (Fick) 6.35  Cardiac Index (Fick) 3.16  Cardiac Output (Thermo) 6.39  Cardiac Index (Thermo) 3.17 PVR 4.1 WU PAPi 2.7   Echo (9/16): LV EF 60-65%, moderate LVH, D-shaped septum, severe RV enlargement with severely decreased systolic function, IVC dilated, bioprosthetic aortic valve stable, MV repair with no MR/mean gradient 8, s/p TV repair.   Objective:    Weight  Range: 74.2 kg Body mass index is 20.45 kg/m.   Vital Signs:   Temp:  [96.5 F (35.8 C)-98.1 F (36.7 C)] 97.9 F (36.6 C) (09/19 0818) Pulse Rate:  [79-86] 80 (09/19 1015) Resp:  [12-32] 23 (09/19 1015) BP: (78-120)/(25-95) 90/28 (09/19 1015) SpO2:  [93 %-100 %] 96 % (09/19 1015) Weight:  [74.2 kg] 74.2 kg (09/19 0500) Last BM Date : 07/26/23  Weight change: Filed Weights   07/25/23 0354 07/26/23 0500 07/27/23 0500  Weight: 73.7 kg 73.8 kg 74.2 kg   Intake/Output:   Intake/Output Summary (Last 24 hours) at 07/27/2023 1031 Last data filed at 07/27/2023 1000 Gross per 24 hour  Intake 2196.06 ml  Output 2900 ml  Net -703.94 ml    CVP 5  Physical Exam   General: NAD, frail Neck: No JVD, no thyromegaly or thyroid nodule.  Lungs: Clear to auscultation bilaterally with normal respiratory effort. CV: Nondisplaced PMI.  Heart regular S1/S2, no S3/S4, no murmur.  No peripheral edema.   Abdomen: Soft, nontender, no hepatosplenomegaly, no distention.  Skin: Intact without lesions or rashes.  Neurologic: Alert and oriented x 3.  Psych: Normal affect. Extremities: No clubbing or cyanosis.  HEENT: Normal.   Telemetry   V paced.   Labs    CBC Recent Labs    07/26/23 0420 07/27/23 0558  WBC 6.4 7.3  HGB 8.2* 8.4*  HCT 27.2* 28.6*  MCV 108.4* 107.5*  PLT 239 250   Basic Metabolic Panel Recent Labs    40/98/11 0420 07/27/23 0530  NA 132* 132*  K  5.0 4.9  CL 99 102  CO2 26 26  GLUCOSE 106* 117*  BUN 31* 30*  CREATININE 1.75* 1.68*  CALCIUM 9.1 8.9  MG 2.6* 2.6*  PHOS 1.9* 2.1*   Liver Function Tests Recent Labs    07/26/23 0420 07/27/23 0530  ALBUMIN 2.6* 2.7*   No results for input(s): "LIPASE", "AMYLASE" in the last 72 hours. Cardiac Enzymes No results for input(s): "CKTOTAL", "CKMB", "CKMBINDEX", "TROPONINI" in the last 72 hours.  BNP: BNP (last 3 results) No results for input(s): "BNP" in the last 8760 hours.  ProBNP (last 3 results) No  results for input(s): "PROBNP" in the last 8760 hours.  D-Dimer No results for input(s): "DDIMER" in the last 72 hours. Hemoglobin A1C No results for input(s): "HGBA1C" in the last 72 hours. Fasting Lipid Panel No results for input(s): "CHOL", "HDL", "LDLCALC", "TRIG", "CHOLHDL", "LDLDIRECT" in the last 72 hours. Thyroid Function Tests No results for input(s): "TSH", "T4TOTAL", "T3FREE", "THYROIDAB" in the last 72 hours.  Invalid input(s): "FREET3"  Other results:  Imaging   No results found.  Medications:     Scheduled Medications:  acyclovir  200 mg Per Tube BID   aspirin  81 mg Per Tube Daily   atorvastatin  10 mg Per Tube QHS   azelastine  2 spray Each Nare BID   Chlorhexidine Gluconate Cloth  6 each Topical Daily   darbepoetin (ARANESP) injection - DIALYSIS  150 mcg Subcutaneous Q Tue-1800   droxidopa  100 mg Oral TID WC   escitalopram  10 mg Per Tube Daily   feeding supplement (PROSource TF20)  60 mL Per Tube Q4H   folic acid  1 mg Per Tube Daily   Gerhardt's butt cream   Topical Daily   guaiFENesin  15 mL Per Tube BID   heparin flush  10 Units Intracatheter Once   heparin  5,000 Units Subcutaneous Q8H   influenza vac split trivalent PF  0.5 mL Intramuscular Once   lidocaine-EPINEPHrine  20 mL Other Once   midodrine  30 mg Per Tube TID   multivitamin  1 tablet Per Tube BID   mouth rinse  15 mL Mouth Rinse 4 times per day   pantoprazole (PROTONIX) IV  40 mg Intravenous Q12H   sodium chloride flush  3 mL Intravenous Q12H   sodium chloride flush  3 mL Intravenous Q12H   thiamine  100 mg Per Tube Daily    Infusions:   prismasol BGK 4/2.5 600 mL/hr at 07/27/23 0039    prismasol BGK 4/2.5 400 mL/hr at 07/27/23 0756   sodium chloride 10 mL/hr at 07/27/23 1000   sodium chloride     sodium chloride 10 mL/hr at 07/27/23 1000   albumin human Stopped (06/23/23 0040)   epinephrine Stopped (07/25/23 2033)   feeding supplement (VITAL 1.5 CAL) 60 mL/hr at 07/27/23 1000    prismasol BGK 4/2.5 1,500 mL/hr at 07/27/23 1610   vasopressin 0.04 Units/min (07/27/23 1000)    PRN Medications: sodium chloride, sodium chloride, sodium chloride, acetaminophen (TYLENOL) oral liquid 160 mg/5 mL, albumin human, bisacodyl **OR** bisacodyl, clonazepam, dextrose, docusate, heparin, loperamide HCl, ondansetron (ZOFRAN) IV, oxyCODONE, phenol, polyethylene glycol, simethicone, sodium chloride, sodium chloride flush, sodium chloride flush  Patient Profile   52 y/o AAM w/ mildly reduced systolic heart failure (EF once 45-50% in 2013 but normal on subsequent echos) valvular heart disease including severe AS, 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 EF 55-60% w/ mild RV dysfunction by echo in 6/24.    Admitted for elective triple vale surgery + MAZE. S/p bioprosthetic aortic valve replacement + mitral and tricuspid valve repairs. Post-op course c/b persistent vasoplagic syndrome.   Assessment/Plan   1.  Acute on Chronic Systolic Heart Failure w/ Prominent RV Dysfunction  - LVEF once 45-50% in 2013 but normal on subsequent echos. Pre-op TTE 6/25 EF 55-60%, GIIIDD (Restrictive) RV mildly reduced, mod PH w/ estimated RVSP 48 mmHg  - Intra-op TEE (while on 4 mcg of Epi) LVEF 45%, RV mod-severely reduced, stable AV prosthesis w/ normal gradient and stable valvuloplasty rings w/ mod residual MS, mean gradient 7 mmgH.   - echo 07-06-23: EF 70-75%, severe concentric LVH, RV mod reduced, RV severely enlarged. LA/RA mod dilated. Large pericardial effusion. No evidence of tamponade.  - Echo 08/29: EF 65-70%, RV moderately reduced, pericardial effusion slightly smaller, no tamponade - RHC c/w high output HF physiology with very high pulmonary pressures 2/2 AV fistula despite severe RV dysfunction - s/p Fistula ligation 9/6   - Echo (9/9) with EF 60-65%,  moderate RV dysfunction/severe RV enlargement, D-shaped septum, large pericardial effusion without tamponade, stable bioprosthetic AVR, s/p MV repair with mean gradient 8, s/p TV repair.   - 9/16 RHC showed predominantly RV failure but with adequate PAPi. PA pressure is moderately elevated, lower than prior. Cardiac output is also mildly lower s/p ligation of AV fistula.  - Echo (9/17) with  LV EF 60-65%, moderate LVH, D-shaped septum, severe RV enlargement with severely decreased systolic function, IVC dilated, bioprosthetic aortic valve stable, MV repair with no MR/mean gradient 8, s/p TV repair.  - Severe RV failure by last echo, has not improved.  LV systolic function normal. Last RHC 9/16 showed elevated right-sided filling pressures but adequate PAPi.   CVP down to 5 now on my read.  Run CVVH even for now, think it would be reasonable to stop CVVH with CVP now low and try to get him off vasopressin and to iHD. If he cannot tolerate iHD this time, will need strong consideration of comfort care transition. - OK to wean vasopressin for SBP > 85.  - Continue midodrine 30 tid + droxidopa.    2. Valvular Heart Disease  - pre-op diagnosis, severe AS, severe MR 2/2 MVP, severe TR - s/p bioprosthetic AVR, mitral and tricuspid valve repairs - stable gradients on intra-op TEE post repairs - stable on echo 8/22 and July 06, 2023 - stable on echo 9/9, mod MS mG 8.3 mmHg   3. Post-op CHB  - EP following. - S/p RIJ temp wire Jul 06, 2023 - EPW left in place d/t concern for risk of tamponade - V paced 80.  Still has TTVP, with CHB.  - Needs to tolerate iHD before any permanent device   4. ESRD on HD  - s/p failed kidney transplants in 1999 and again in 2015 - Has failed iHD once so far and remains on pressors, getting CVVH.   - See above, will stop CVVH today and try to get him off vasopressin with eventual re-trial of iHD.  5. H/o Atrial Fibrillation  - S/p MAZE   6. Large pericardial effusion - seen on echo  07-06-2023. Previously small on echo 06/29/23 - no evidence of tamponade - Limited echo 08/29 stable - Echo 9/9, large pericardial effusion but no tamponade.  - Echo 9/17 with moderate pericardial effusion.  7.  Severe PAH - pre-op PA pressures 58/30. With PVR 1.2 CO 10L  - post-op PA 97/33 (55) PCWP 30 PVR 4.2 CO 7L - suspect combination of valvular heart disease and high-output physiology - should improved with AVF ligation -  RHC  PA 65/25 (41), PCWP 15 PVR 4.1  8. GOC - palliative care initially following. Now signed off 9/2 per patient request - remains full code - If unable to tolerate iHD will need Hospice.  9. FEN - Getting tube feeds. - Remains NPO with paralyzed vocal cord and aspiration risk.   CRITICAL CARE Performed by: Marca Ancona  Total critical care time: 35 minutes  Critical care time was exclusive of separately billable procedures and treating other patients.  Critical care was necessary to treat or prevent imminent or life-threatening deterioration.  Critical care was time spent personally by me on the following activities: development of treatment plan with patient and/or surrogate as well as nursing, discussions with consultants, evaluation of patient's response to treatment, examination of patient, obtaining history from patient or surrogate, ordering and performing treatments and interventions, ordering and review of laboratory studies, ordering and review of radiographic studies, pulse oximetry and re-evaluation of patient's condition.   Marca Ancona 07/27/2023 10:31 AM

## 2023-07-27 NOTE — Progress Notes (Signed)
Daily Progress Note   Patient Name: [name]       Date: 07/27/2023 DOB: [birth year]  Age: 52 y.o. MRN#: [id number] Attending Physician: Eugenio Hoes, MD Primary Care Physician: Donne Anon, MD Admit Date: 07/01/2023  Reason for Consultation/Follow-up: Establishing goals of care  Subjective: Medical records reviewed including progress notes, labs, and imaging. Patient assessed at the bedside. He appears very fatigued, states he is "worn out" from PT/OT/speech therapies today. No family present during my visit. Discussed with RN. Patient has discontinued CRRT today with plan to attempt iHD tomorrow. If he continues to fail trials of iHD, may need to transition to comfort care and hospice.  Offered patient additional support from palliative care as he continues to face complex medical decision-making, emphasizing that it is his choice and acknowledging his stated preference to hold off on palliative visits in the past. He is agreeable to additional palliative visits at this time, though he would prefer to wait until his father visits on Saturday to hold a detailed discussion. We briefly reviewed topics that are beneficial to discuss including comfort care, hospice, artificial nutrition. He verbalized his understanding. He denies other needs at this time.  Called [name] and provided update on the above conversation. Emphasized the importance of ongoing goals of care discussions and high potential for further decline given that patient has had difficulty with iHD trials throughout his admission. We reviewed possibility of patient needing hospice soon and his father understands, asking where hospice care might be provided. He confirms that he will be visiting on Saturday, tentatively  around 11am. He still has PMT contact information and will call if anything changes.    Questions and concerns addressed. PMT will continue to support holistically.   Length of Stay: 35   Physical Exam Vitals and nursing note reviewed.  Constitutional:      General: He is not in acute distress.    Appearance: He is cachectic. He is ill-appearing.     Interventions: Nasal cannula in place.     Comments: 2L  Cardiovascular:     Rate and Rhythm: Normal rate.  Pulmonary:     Effort: Pulmonary effort is normal.  Skin:    General: Skin is cool and dry.  Neurological:     Mental Status: He is alert.  Vital Signs: BP (!) 88/33   Pulse 80   Temp 98 F (36.7 C) (Oral)   Resp (!) 0   Ht 6\' 3"  (1.905 m)   Wt 74.2 kg   SpO2 97%   BMI 20.45 kg/m  SpO2: SpO2: 97 % O2 Device: O2 Device: Room Air O2 Flow Rate: O2 Flow Rate (L/min): 2 L/min      Palliative Assessment/Data: 20-30%   Palliative Care Assessment & Plan   Patient Profile: 52 y.o. male  with past medical history of ESRD on HD 20 year s/p failed renal transplant in 1999 and 2015, moderate pulmonary HTN, severe MR, TR, and severe AS s/p repairs, anxiety, depression, bipolar disorder, CAD, afib s/p MAZE admitted on 06/12/2023 with fatigue and DOE due to valvular disease.    Patient underwent MVR/AVR/TVR 8/15 with post-op course complicated by distributive shock and vasospastic syndrome. He underwent temporary pacemaker placement on 8/27 for heart block and was on CRRT 8/16-8/25, then resumed on 8/26 for optimization of hemodynamics and reduction of pressor requirements. There is concern for AV fistula in the setting of high output HF.   PMT has been consulted to assist with goals of care conversation on 8/29. Initial request from TCTS was to defer initial visit to 9/2, however patient's pressor requirements then increased overnight and PMT was asked to see by AHF team on 8/30.  Assessment: Goals of care conversation   AKI on ESRD, on/off CRRT Severe pulmonary hypertension  Acute on chronic HFrEF MR, TR, AS s/p valvular repair x3 Post-op distributive/cardiogenic shock Post-op CHB Severe protein calorie malnutrition Dysphagia   Recommendations/Plan: Continue full code/full scope treatment  Patient agreeable to PMT getting re-involved, would like his father to be present for discussions Tentative family meeting on Saturday 9/21 at 11am with patient's parents Psychosocial and emotional support provided PMT will continue to follow and support   Prognosis:  Very poor  Discharge Planning: To Be Determined   Care plan was discussed with Patient, patient's father, RN, PCCM PA   Total time: I spent 35 minutes in the care of the patient today in the above activities and documenting the encounter.          Wadell Craddock Jeni Salles, PA-C  Palliative Medicine Team Team phone # [phone]  Thank you for allowing the Palliative Medicine Team to assist in the care of this patient. Please utilize secure chat with additional questions, if there is no response within 30 minutes please call the above phone number.  Palliative Medicine Team providers are available by phone from 7am to 7pm daily and can be reached through the team cell phone.  Should this patient require assistance outside of these hours, please call the patient's attending physician.

## 2023-07-27 NOTE — Progress Notes (Signed)
Chart/tele review Unfortunately no real progress seems being made Remains on vaso Midodrine 30mg  TID Started on Droxydopa  Dr. Lenore Cordia wire remains stable Threshold <1 w/no underlying conduction Left at 10 and 82bpm  Francis Dowse, PA-C

## 2023-07-27 NOTE — Progress Notes (Signed)
[facility] Progress Note  Subjective: Seen in room. Remains on vaso gtt. Net neg 600cc, wt's unchanged. Pt's hands cramping this am.   Vitals:   07/27/23 1110 07/27/23 1115 07/27/23 1130 07/27/23 1145  BP: (!) 61/29 (!) 82/46 (!) 92/33 (!) 109/32  Pulse: 80 80 80 79  Resp: (!) 21 (!) 25 (!) 21 (!) 22  Temp:      TempSrc:      SpO2: 95% 98% 96% 95%  Weight:      Height:        Physical Exam:       General adult male in bed Skin turgor poor  Lungs clear bilat  Heart S1S2 Abdomen soft nontender nondistended Extremities no edema appreciated Neuro - resting, somnolent, awakens easily Access left internal jugular tunneled dialysis catheter    OP HD: [place] triad MWF 3rd shift 3h    F250     94kg   2K/2.5 Ca bath   RUE AVF    Summary: [name] is a/an 52 y.o. 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 Underwent aortic biovalve replacement, TV repair, MV repari, bilat Maze procedures.    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    # Complete heart block - post-op, per EP/ cardiology, still with temp pacer but plans for permanent option in process pending stable status  # Possible high-output heart failure - per CHF. SP AVF ligation 9/06 by VVS.  Right heart catheterization today  # ESRD - pt was too hypotensive for hemodialysis post-op.  CRRT started 8/16 and stopped 07/19/23.  Received HD 9/13 but limited by UF and returned to pressors thereafter.  Resumed CRRT 9/14. CVP's down today and pt looks dry on exam. Will dc CRRT today and plan on trial of iHD in 1-2 days.   # HD access - AVF ligated for CHF. Now s/p [facility] 07/19/23 with IR. Working well.    # Distributive shock - vaso gtt running. Cont midodrine 30 tid and droxidopa.   # pHTN - severe by RHC --> Off NO2 now. Per  CHF team.   # Volume - improved, wt's down to 74kg (20kg down from admit)  # Pericardial effusion - per CT surgery and CHF. Assessed as moderate on last TTE   # Anemia of ESRD - transfuse as needed per primary team. Aranesp increased to 150 mcg weekly on Tuesdays    # MBD ckd -  Phos acceptable.  Calcium as below.  No binders currently as on CRRT - normally on renvela.    # Hypercalcemia - peak Ca 13.8 on 9/13, most likely due to immobility. Pt received calcitonin and  pamidronate and Ca++ is now in the 9-10 range.   # Progressive frailty and FTT    Maree Krabbe, MD  07/27/2023, 12:08 PM  Recent Labs  Lab 07/26/23 0420 07/27/23 0530 07/27/23 0558  HGB 8.2*  --  8.4*  ALBUMIN 2.6* 2.7*  --   CALCIUM 9.1 8.9  --   PHOS 1.9* 2.1*  --   CREATININE 1.75* 1.68*  --   K 5.0 4.9  --     Inpatient medications:  acyclovir  200 mg Per Tube BID   aspirin  81 mg Per Tube Daily  atorvastatin  10 mg Per Tube QHS   azelastine  2 spray Each Nare BID   Chlorhexidine Gluconate Cloth  6 each Topical Daily   darbepoetin (ARANESP) injection - DIALYSIS  150 mcg Subcutaneous Q Tue-1800   droxidopa  100 mg Oral TID WC   escitalopram  10 mg Per Tube Daily   feeding supplement (PROSource TF20)  60 mL Per Tube Q4H   folic acid  1 mg Per Tube Daily   Gerhardt's butt cream   Topical Daily   guaiFENesin  15 mL Per Tube BID   heparin flush  10 Units Intracatheter Once   heparin  5,000 Units Subcutaneous Q8H   influenza vac split trivalent PF  0.5 mL Intramuscular Once   lidocaine-EPINEPHrine  20 mL Other Once   midodrine  30 mg Per Tube TID   multivitamin  1 tablet Per Tube BID   mouth rinse  15 mL Mouth Rinse 4 times per day   pantoprazole (PROTONIX) IV  40 mg Intravenous Q12H   sodium chloride flush  3 mL Intravenous Q12H   sodium chloride flush  3 mL Intravenous Q12H   thiamine  100 mg Per Tube Daily     prismasol BGK 4/2.5 600 mL/hr at 07/27/23 0039    prismasol BGK 4/2.5 400 mL/hr at  07/27/23 0756   sodium chloride 10 mL/hr at 07/27/23 1100   sodium chloride     sodium chloride 10 mL/hr at 07/27/23 1100   albumin human Stopped (06/23/23 0040)   epinephrine Stopped (07/25/23 2033)   feeding supplement (VITAL 1.5 CAL) 60 mL/hr at 07/27/23 1100   prismasol BGK 4/2.5 1,500 mL/hr at 07/27/23 0552   vasopressin 0.03 Units/min (07/27/23 1105)   sodium chloride, sodium chloride, sodium chloride, acetaminophen (TYLENOL) oral liquid 160 mg/5 mL, albumin human, bisacodyl **OR** bisacodyl, clonazepam, dextrose, docusate, heparin, loperamide HCl, ondansetron (ZOFRAN) IV, oxyCODONE, phenol, polyethylene glycol, simethicone, sodium chloride, sodium chloride flush, sodium chloride flush

## 2023-07-28 DIAGNOSIS — I5023 Acute on chronic systolic (congestive) heart failure: Secondary | ICD-10-CM | POA: Diagnosis not present

## 2023-07-28 DIAGNOSIS — Z9889 Other specified postprocedural states: Secondary | ICD-10-CM | POA: Diagnosis not present

## 2023-07-28 LAB — CBC
HCT: 29.1 % — ABNORMAL LOW (ref 39.0–52.0)
Hemoglobin: 8.8 g/dL — ABNORMAL LOW (ref 13.0–17.0)
MCH: 32.2 pg (ref 26.0–34.0)
MCHC: 30.2 g/dL (ref 30.0–36.0)
MCV: 106.6 fL — ABNORMAL HIGH (ref 80.0–100.0)
Platelets: 259 10*3/uL (ref 150–400)
RBC: 2.73 MIL/uL — ABNORMAL LOW (ref 4.22–5.81)
RDW: 20.3 % — ABNORMAL HIGH (ref 11.5–15.5)
WBC: 10.1 10*3/uL (ref 4.0–10.5)
nRBC: 0.2 % (ref 0.0–0.2)

## 2023-07-28 LAB — GLUCOSE, CAPILLARY
Glucose-Capillary: 100 mg/dL — ABNORMAL HIGH (ref 70–99)
Glucose-Capillary: 104 mg/dL — ABNORMAL HIGH (ref 70–99)
Glucose-Capillary: 104 mg/dL — ABNORMAL HIGH (ref 70–99)
Glucose-Capillary: 109 mg/dL — ABNORMAL HIGH (ref 70–99)
Glucose-Capillary: 93 mg/dL (ref 70–99)

## 2023-07-28 LAB — BASIC METABOLIC PANEL
Anion gap: 9 (ref 5–15)
BUN: 58 mg/dL — ABNORMAL HIGH (ref 6–20)
CO2: 23 mmol/L (ref 22–32)
Calcium: 9.5 mg/dL (ref 8.9–10.3)
Chloride: 99 mmol/L (ref 98–111)
Creatinine, Ser: 2.99 mg/dL — ABNORMAL HIGH (ref 0.61–1.24)
GFR, Estimated: 25 mL/min — ABNORMAL LOW (ref >60)
Glucose, Bld: 106 mg/dL — ABNORMAL HIGH (ref 70–99)
Potassium: 5.3 mmol/L — ABNORMAL HIGH (ref 3.5–5.1)
Sodium: 131 mmol/L — ABNORMAL LOW (ref 135–145)

## 2023-07-28 LAB — COOXEMETRY PANEL
Carboxyhemoglobin: 3.2 % — ABNORMAL HIGH (ref 0.5–1.5)
Methemoglobin: <0.7 % (ref 0.0–1.5)
O2 Saturation: 75.3 %
Total hemoglobin: 9.4 g/dL — ABNORMAL LOW (ref 12.0–16.0)

## 2023-07-28 LAB — MAGNESIUM: Magnesium: 2.9 mg/dL — ABNORMAL HIGH (ref 1.7–2.4)

## 2023-07-28 MED ORDER — NEPRO/CARBSTEADY PO LIQD: 237.0000 mL | Freq: Two times a day (BID) | ORAL | Status: DC

## 2023-07-28 MED ORDER — VITAL 1.5 CAL PO LIQD
1000.0000 mL | ORAL | Status: DC
  Administered 2023-07-28 – 2023-07-30 (×3): 1000 mL

## 2023-07-28 MED ORDER — CHLORHEXIDINE GLUCONATE CLOTH 2 % EX PADS
6.0000 | MEDICATED_PAD | Freq: Every day | CUTANEOUS | Status: DC
  Administered 2023-07-29 – 2023-07-30 (×2): 6 via TOPICAL

## 2023-07-28 MED ORDER — SODIUM ZIRCONIUM CYCLOSILICATE 10 G PO PACK
10.0000 g | PACK | Freq: Once | ORAL | Status: AC
  Administered 2023-07-28: 10 g via ORAL
  Filled 2023-07-28: qty 1

## 2023-07-28 NOTE — Progress Notes (Signed)
Nutrition Follow-up  DOCUMENTATION CODES:   Severe malnutrition in context of chronic illness  INTERVENTION:   RD continues to recommend more permanent enteral feeding access (PEG vs PEJ pending tolerance)  Continue TF to meet 100% of nutritional needs for now.  Tube Feeding via Cortrak:  Increase Pivot 1.5 to 70 ml/hr TF at goal provides 2520 kcals, 1277 mL of free water  Full Liquid Diet by the spoon feeding; RN to thicken soup and drinks to nectar thick. No Ice Cream (thin liquid), plan to order [employer] instead. No [place] Ice or Jell-o   Assess for appropriateness of Calorie Count once diet advanced to solids, pt taking in more than bites  Magic cup TID with meals, each supplement provides 290 kcal and 9 grams of protein  D/C Pro-Source TF protein modular for now as pt refusing as he believes this is contributing to his loose stool  Continue Renal MVI BID, Thiamine and folic acid   NUTRITION DIAGNOSIS:   Severe Malnutrition related to chronic illness as evidenced by severe muscle depletion, severe fat depletion.  Being addressed via nutrition support and po diet  GOAL:   Patient will meet greater than or equal to 90% of their needs  Met via TF, Progressing po diet  MONITOR:   PO intake, TF tolerance, Labs, Weight trends  REASON FOR ASSESSMENT:   Consult Assessment of nutrition requirement/status  ASSESSMENT:   52 y/o male with h/o glomerulonephritis (age 41) s/p renal transplant (1999) and right and left renal transplants (2015), ESRD on HD (>20 yrs), HTN, bipolar 2 disorder, PTSD, ADHD, anxiety, depression, GERD, OSA, NICM, thrombocytopenia, pulmonary hypertension, afib s/p DCCV (03/2019), CHF, severe mitral and tricuspid valve regurgitation and moderate aortic stenosis s/p mitral valve repair, tricuspid valve annuloplasty, aortic valve replacement and full right and left maze procedure 8/16.  8/15 - AVR, MVR, TVR by Dr. Leafy Ro 8/16 - CRRT initiated 8/17  - extubated, clear liquid diet 8/19 - chest tube inserted for effusion, BiPAP overnight 8/20 - no BM, abd x-ray with nonobstructive bowel gas pattern 8/21 - Cortrak placed, TF initiated 8/22 - NPO due to mental status, TF continued 8/25 - CRRT discontinued  8/26 - CRRT restarted 8/28 - TF on hold 8/29 - TF remains on hold 8/30 - trickle TF resumed per MD, TPN recommended 8/31 - TF rate increased to 30 ml/hr 9/02 - TF rate increased to 35 ml/hr 9/03 - CRRT temporarily off due to access issues, TF rate increased to 40 ml/hr, supplemental TPN recommended, CRRT resumed 9/04  s/p RHC with occlusion of fistula, started iNO 9/06 Ligation of R arm fistula with improved BP 9/11 CRRT stopped 9/12 +emesis x 2, TF held, Repeat FEES-NPO, sig dysphagia and newly noted vocal cord paralysis, abd xray with nonobstructive bowel gas pattern 9/13 Cortrak and bridle replaced; noted device related pressure injury from bridle. iHD with minimal UF due to hypotension, Cortrak advanced into duodenum, Trickle 9/14 resuming CRRT; rectal tube replaced 9/19 CRRT d/c again; MBSS performed and pt cleared for FL, Nectar Thick Liquids via spoon  Pt off CRRT, off vasopressin. Plan for iHD either tonight or tomorrow. If able to tolerate iHD then plan for temp perm pacer  SLP following closely; pt is allowed [facility] diet. All liquids must be Nectar Thick with RN to thicken for patient (this includes all liquid beverages and soups). NO regular ice cream, magic cup ok. NO Jell-O or [place] Ice. Pudding and yogurt ok. Noted SLP indicating that pt would need  Tolerating Vital 1.5 at 65 ml/hr via Cortrak (post pyloric). Pt is refusing Pro source supplements as he attributes these as cause of loose stool. Plan to d/c Pro-Source for now  Weight History this Admission:  Current:74.2 kg Admit Outpatient EDW: 94 kg  Rectal tube (FMS) out. +1 BM yesterday, nothing yet today  Labs: sodium 131 (L), potassium 5.3, BUN 58, Creatinine  2.99 Meds: folic acid, renavite BID, thiamine   Diet Order:   Diet Order             Diet full liquid Room service appropriate? Yes; Fluid consistency: Thin  Diet effective now                   EDUCATION NEEDS:   Not appropriate for education at this time  Skin:  Skin Assessment: Skin Integrity Issues: Skin Integrity Issues:: Stage II Stage II: sacrum, nose from bridle Incisions: sternal  Last BM:  9/19 medium type 7  Height:   Ht Readings from Last 1 Encounters:  06/19/2023 6\' 3"  (1.905 m)    Weight:   Wt Readings from Last 1 Encounters:  07/27/23 74.2 kg    Ideal Body Weight:  89 kg  BMI:  Body mass index is 20.45 kg/m.  Estimated Nutritional Needs:   Kcal:  2400-2600 kcals  Protein:  155-190 g  Fluid:  1L plus UOP   [name] MS, RDN, LDN, CNSC Registered Dietitian 3 Clinical Nutrition RD Pager and On-Call Pager Number Located in Central

## 2023-07-28 NOTE — Progress Notes (Addendum)
[facility] Progress Note  Subjective: Seen in room. Off vasopressin at the moment. 80-90s SBP. Pt lethargic but responds appropriately.   Vitals:   07/28/23 0645 07/28/23 0700 07/28/23 0800 07/28/23 0900  BP: (!) 93/39 (!) 89/47 (!) 88/50 (!) 89/42  Pulse: 80 80 80 80  Resp: (!) 25 (!) 24 (!) 24 (!) 23  Temp:  97.8 F (36.6 C)    TempSrc:  Axillary    SpO2: 91% 91% 93% (!) 87%  Weight:      Height:        Physical Exam:       General adult male in bed Skin turgor poor  Lungs clear bilat  Heart S1S2 Abdomen soft nontender nondistended Extremities no edema appreciated Neuro - resting, somnolent, awakens to voice Access left internal jugular tunneled dialysis catheter    OP HD: [place] triad MWF 3rd shift 3h    F250     94kg   2K/2.5 Ca bath   RUE AVF    Summary: [name] is a/an 52 y.o. 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 Underwent aortic biovalve replacement, TV repair, MV repari, bilat Maze procedures.    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    # Complete heart block - post-op, per EP/ cardiology, still with temp pacer but plans for permanent option in process pending stable status  # Possible high-output heart failure - per CHF. SP AVF ligation 9/06 by VVS.    # ESRD - pt was too hypotensive for hemodialysis post-op.  CRRT started 8/16 and stopped 07/19/23.  Received HD 9/13 but limited by UF and returned to pressors thereafter.  Resumed CRRT 9/14. CVP's down today and pt looks dry on exam. CRRT stopped again 9/19. Will plan iHD either tonight or tomorrow (depends on staffing). Suspect it will be difficult to succeed with iHD. Have d/w patient that prognosis is very poor and he nodded that he understands.   # HD access - AVF ligated for CHF. Now s/p LIJ [facility]  07/19/23 with IR. Working well.    # Distributive shock - off pressors. Cont midodrine 30 tid and droxidopa.   # pHTN - severe by RHC --> s/p course of inhaled NO2.   # Volume - CVP 4-8, down 20kg from admission.   # Pericardial effusion - per CT surgery and CHF. Assessed as moderate on last TTE   # Anemia of ESRD - transfuse as needed per primary team. Aranesp increased to 150 mcg weekly on Tuesdays    # MBD ckd -  Phos acceptable.  Calcium as below.  No binders currently as on CRRT - normally on renvela.    # Hypercalcemia - peak Ca 13.8 on 9/13, most likely due to immobility. Pt received calcitonin and  pamidronate and Ca++ is now in the 9-10 range.     Maree Krabbe, MD  07/28/2023, 10:43 AM  Recent Labs  Lab 07/26/23 0420 07/27/23 0530 07/27/23 0558 07/28/23 0431 07/28/23 0522  HGB 8.2*  --  8.4* 8.8*  --   ALBUMIN 2.6* 2.7*  --   --   --   CALCIUM 9.1 8.9  --   --  9.5  PHOS 1.9* 2.1*  --   --   --  CREATININE 1.75* 1.68*  --   --  2.99*  K 5.0 4.9  --   --  5.3*    Inpatient medications:  acyclovir  200 mg Per Tube BID   aspirin  81 mg Per Tube Daily   atorvastatin  10 mg Per Tube QHS   azelastine  2 spray Each Nare BID   Chlorhexidine Gluconate Cloth  6 each Topical Daily   darbepoetin (ARANESP) injection - DIALYSIS  150 mcg Subcutaneous Q Tue-1800   droxidopa  100 mg Oral TID WC   escitalopram  10 mg Per Tube Daily   feeding supplement (PROSource TF20)  60 mL Per Tube Q4H   folic acid  1 mg Per Tube Daily   Gerhardt's butt cream   Topical Daily   guaiFENesin  15 mL Per Tube BID   heparin flush  10 Units Intracatheter Once   heparin  5,000 Units Subcutaneous Q8H   influenza vac split trivalent PF  0.5 mL Intramuscular Once   lidocaine-EPINEPHrine  20 mL Other Once   midodrine  30 mg Per Tube TID   multivitamin  1 tablet Per Tube BID   mouth rinse  15 mL Mouth Rinse 4 times per day   pantoprazole (PROTONIX) IV  40 mg Intravenous Q12H   sodium chloride  flush  3 mL Intravenous Q12H   sodium chloride flush  3 mL Intravenous Q12H   thiamine  100 mg Per Tube Daily    sodium chloride 10 mL/hr at 07/28/23 0900   sodium chloride     sodium chloride 10 mL/hr at 07/28/23 0900   albumin human Stopped (06/23/23 0040)   epinephrine Stopped (07/25/23 2033)   feeding supplement (VITAL 1.5 CAL) 60 mL/hr at 07/28/23 0900   vasopressin Stopped (07/28/23 0829)   sodium chloride, sodium chloride, sodium chloride, acetaminophen (TYLENOL) oral liquid 160 mg/5 mL, albumin human, bisacodyl **OR** bisacodyl, clonazepam, dextrose, docusate, loperamide HCl, ondansetron (ZOFRAN) IV, oxyCODONE, phenol, polyethylene glycol, simethicone, sodium chloride flush, sodium chloride flush

## 2023-07-28 NOTE — Progress Notes (Signed)
NAME:  Natthew Gebre, MRN:  [id number], DOB:  1971-01-10, LOS: 36 ADMISSION DATE:  06/08/2023, CONSULTATION DATE: 06/20/2023 REFERRING MD: Leafy Ro - TCTS, CHIEF COMPLAINT: Postcardiac surgery  History of Present Illness:  This is a 52 year old gentleman, past medical history of anxiety depression, bipolar disease, congestive heart failure chronic diastolic heart failure kidney disease followed by renal transplant in 10-07-1998 repeat renal transplant in 07-Oct-2014.  Patient underwent surgery today for severe mitral regurgitation severe tricuspid regurgitation and moderate aortic stenosis.  He also has atrial fibrillation received maze procedure.  He had a complex mitral valve repair a tricuspid valve annuloplasty and aortic valve replacement followed by full right and left maze procedure.  Pertinent Medical History:   Past Medical History:  Diagnosis Date   ADHD (attention deficit hyperactivity disorder)    Anemia    Anemia, chronic disease 07/13/2011   Anxiety    Arthritis    Bipolar 2 disorder (HCC) 08/18/2011   Diagnosed in 10-07-2010.    Bipolar disorder (HCC)    Blood dyscrasia    Nephrotic Anemia   Blood transfusion    Blood transfusion without reported diagnosis    Chronic diastolic CHF (congestive heart failure) (HCC)    Complication of anesthesia    Woke up intubated gets combative  afraid to be alone    Congestive heart failure (CHF) (HCC) 07/13/2011   Onset 2005/10/07.  Followed by Dr. Eden Emms.  S/p cardiac catheterization in [facility] Dr. Anne Fu..  S/p cardiac catheterization in Oct 07, 2013 by Nishan.  Echo 07-Oct-2013.    Coronary artery disease    Depression    Dysrhythmia    A. Fib 2023-10-08 with DCCV in May 2024   Erectile dysfunction    ESRD on hemodialysis (HCC) 07/13/2011   Glomerulonephritis at age 89, started HD in 10/08/1995.  Deceased donor renal transplant 10/07/1998 at [facility], then kidney failed and went back on HD in 07-Oct-2005.  L forearm AVF . s/p repeat renal transplant 10/2014 [facility].   GI bleed 11/01/2011   Rectal  bleeding with emesis/diarrhea    Heart murmur    Hemodialysis status (HCC)    HSV (herpes simplex virus) infection    Hydrocele, right 11/25/2014   Hypertension    Hypertensive emergency 01/06/2013   Hypoglycemia 01/11/2013   Influenza-like illness 12/18/2013   Mild ascending aorta dilatation (HCC)    NICM (nonischemic cardiomyopathy) (HCC)    Pericardial effusion    a. Mod by echo in 10/07/13, similar to prior.   Peripheral polyneuropathy 08/04/2016   Pneumonia    Pulmonary edema 01/06/2013   Pulmonary hypertension associated with end stage renal disease on dialysis [facility]) 07/13/2011   Renal transplant, status post 11/25/2014   Pt with Glomerulonephritis at age 45. Deceased donor renal transplant October 07, 1998 right and left renal transplant 10/2014.  Followed by [facility] transplant team; nephrologist is Dr. Lonna Cobb.    Respiratory failure with hypoxia (HCC) 01/06/2013   Shortness of breath 12/12/2012   Sleep apnea    Swollen testicle 07/13/2011   URI (upper respiratory infection) 01/25/2012    Significant Hospital Events: Including procedures, antibiotic start and stop dates in addition to other pertinent events   8/15 - AVR, MVR, TVR with TCTS ([place]). Remains on multiple pressors (NE, Epi, vaso). 8/16 - LIJ Trialysis catheter placed for CRRT.  8/17 extubated  8/18 - remains on CVVHD, 3 pressors  8/19-8/20 improving pressor needs 8/21 agitated delirium; cortrak 8/26 CRRT restarted 8/27 TVP placed, brady overnight and pacing turned to 80 8/30 palli consult  9/1 PRBC for low coox  9/2 coox down a bit again  9/3CRRT off overnight due to access issues. Remains on vaso 0.04, epi 10, norepi 2, DBA 5. 9/4 RHC, started iNO 07-22-2023 repeat RHC, fistula ligated 9/9 Remains on vaso 0.04, epi . 30 midodrine TID, Temp pacer, small effusions on CXR , mild interstitial edema, left lower lobe consolidation most likely atelectasis , afebrile 9/10 intermittent vasopressor. Echo continue to show  severe RV dysfunction. 9/11 [place] cvc to fem site in prep for possible temp perm  9/12 intolerant of EN, vomiting.  9/16 right heart cath:   RHC Procedural Findings (epinephrine 1, vasopressin 0.02): Hemodynamics (mmHg) RA mean 15 RV 64/17 PA 65/25, mean 41 PCWP mean 15 Oxygen saturations: PA 55% AO 94% Cardiac Output (Fick) 6.35  Cardiac Index (Fick) 3.16  Cardiac Output (Thermo) 6.39  Cardiac Index (Thermo) 3.17 PVR 4.1 WU  9/17 Assessed by SLP. Still not ready for swallow eval. Epinephrine off added droxidopa    Interim History / Subjective:  Weaned off pressors for now. In fairly good spirits  Objective:  Blood pressure (!) 89/47, pulse 80, temperature 97.8 F (36.6 C), temperature source Axillary, resp. rate (!) 24, height 6\' 3"  (1.905 m), weight 74.2 kg, SpO2 91%. CVP:  [0 mmHg-26 mmHg] 10 mmHg      Intake/Output Summary (Last 24 hours) at 07/28/2023 0844 Last data filed at 07/28/2023 0600 Gross per 24 hour  Intake 1960.93 ml  Output 592.3 ml  Net 1368.63 ml   Filed Weights   07/25/23 0354 07/26/23 0500 07/27/23 0500  Weight: 73.7 kg 73.8 kg 74.2 kg   Physical Examination:  Chronically ill Weak Abd soft Eating dysphagia diet +muscle wasting +murmur, ext warm  Coox 75 BMP ok CBC stable  Resolved problem list   Acute resp failure w hypoxia and hypercarbia Pericardial effusion without tamponade   Assessment & Plan:   Cardiogenic shock due to biventricular HF  w/ refractory vasoplegia  s/p MVR TVR AS s/p bioprosthetic replacements & MAZE procedure  c/b CHB, severe pHTN- improved a bit after fistula closure ESRD on iHD CKD s/p failed Rtxp 1999, 2015  H/o HSV Anemia, multifactorial Severe kcal/protein malnutrition EN intolerance w/ nausea and vomiting Hypercalcemia- immobility probably primary driving factor Physical deconditioning due to prolonged critical illness + significant chronic dz burden, dysphagia   - iHD and will see if can tolerate  without pressors - Continue midodrine, droxidopa - Continue GOC discussions - Will need extensive rehab - Remains paced, temp-perm at some point if can tolerate HD - Guarded prognosis remains  Best Practice (right click and "Reselect all SmartList Selections" daily)   Diet/type:TF  DVT prophylaxis: heparin sq GI prophylaxis: PPI Lines: Central line and Dialysis Catheter Foley:  No  Code Status:  full code Last date of multidisciplinary goals of care discussion: PMT following  Myrla Halsted MD PCCM [facility] 07/28/2023, 8:44 AM  Please see [email or web address] for pager details.  From 7A-7P if no response, please call [phone]. After hours, please call ELink [phone].

## 2023-07-28 NOTE — Progress Notes (Signed)
Patient ID: [name], male   DOB: 05-May-1971, 52 y.o.   MRN: [id number] TCTS Evening Rounds:  Hemodynamics stable but MAP remains borderline at 50's to 60.   V-paced at 80  Sats 95% RA.

## 2023-07-28 NOTE — Progress Notes (Signed)
Patient ID: [name], male   DOB: 1971-07-31, 52 y.o.   MRN: [id number]     Advanced Heart Failure Rounding Note  PCP-Cardiologist: Reatha Harps, MD   Subjective:   8/15: Elective triple valve surgery + MAZE. Underwent bioprosthetic AVR + mitral and tricuspid valve repairs by Dr. Milinda Antis 8/22: AHF consulted for high-output HF with persistent post-op shock 8/25: CRRT stopped 8/26: CRRT restarted  8/27: S/p TTVP Echo: EF 70-75%, severe LVH, RV mod reduced, RV severely enlarged. LA/RA mod dilated. Large pericardial effusion. No evidence of tamponade.  9/4: RHC c/w severe PAH with normal output and low SVR. PVR 4-5. SBP improved nearly 20 points with AVF occlusion.  9/6: Repeat RHC with subsequent ligation of AVF SVR ~500 despite high-dose pressors 9/9: Echo with EF 60-65%, moderate RV dysfunction/severe RV enlargement, D-shaped septum, large pericardial effusion without tamponade, stable bioprosthetic AVR, s/p MV repair with mean gradient 8, s/p TV repair.   9/16: RHC showed predominantly RV failure but with adequate PAPi. PA pressure is moderately elevated, lower than prior. Cardiac output is also mildly lower s/p ligation of AV fistula.  9/17: Droxidopa added  He is now off vasopressin. CVP 7 today.  CVVH now off.   He is RV paced via TTVP. Remains in [facility]B.    Denies SOB.   RHC 9/16: RHC Procedural Findings (epinephrine 1, vasopressin 0.02): Hemodynamics (mmHg) RA mean 15 RV 64/17 PA 65/25, mean 41 PCWP mean 15 Oxygen saturations: PA 55% AO 94% Cardiac Output (Fick) 6.35  Cardiac Index (Fick) 3.16  Cardiac Output (Thermo) 6.39  Cardiac Index (Thermo) 3.17 PVR 4.1 WU PAPi 2.7   Echo (9/16): LV EF 60-65%, moderate LVH, D-shaped septum, severe RV enlargement with severely decreased systolic function, IVC dilated, bioprosthetic aortic valve stable, MV repair with no MR/mean gradient 8, s/p TV repair.   Objective:    Weight Range: 74.2 kg Body mass index is 20.45 kg/m.    Vital Signs:   Temp:  [97.4 F (36.3 C)-98.2 F (36.8 C)] 97.8 F (36.6 C) (09/20 0700) Pulse Rate:  [78-86] 80 (09/20 0700) Resp:  [0-30] 24 (09/20 0700) BP: (61-121)/(25-88) 89/47 (09/20 0700) SpO2:  [83 %-100 %] 91 % (09/20 0700) Last BM Date : 07/27/23  Weight change: Filed Weights   07/25/23 0354 07/26/23 0500 07/27/23 0500  Weight: 73.7 kg 73.8 kg 74.2 kg   Intake/Output:   Intake/Output Summary (Last 24 hours) at 07/28/2023 0838 Last data filed at 07/28/2023 0600 Gross per 24 hour  Intake 1960.93 ml  Output 592.3 ml  Net 1368.63 ml    CVP 7 Physical Exam   General: Frail, weak.  Neck: No JVD, no thyromegaly or thyroid nodule.  Lungs: Clear to auscultation bilaterally with normal respiratory effort. CV: Nondisplaced PMI.  Heart regular S1/S2, no S3/S4, no murmur.  No peripheral edema.   Abdomen: Soft, nontender, no hepatosplenomegaly, no distention.  Skin: Intact without lesions or rashes.  Neurologic: Alert and oriented x 3.  Psych: Normal affect. Extremities: No clubbing or cyanosis.  HEENT: Normal.   Telemetry   V paced.   Labs    CBC Recent Labs    07/27/23 0558 07/28/23 0431  WBC 7.3 10.1  HGB 8.4* 8.8*  HCT 28.6* 29.1*  MCV 107.5* 106.6*  PLT 250 259   Basic Metabolic Panel Recent Labs    40/98/11 0420 07/27/23 0530 07/28/23 0431 07/28/23 0522  NA 132* 132*  --  131*  K 5.0 4.9  --  5.3*  CL 99 102  --  99  CO2 26 26  --  23  GLUCOSE 106* 117*  --  106*  BUN 31* 30*  --  58*  CREATININE 1.75* 1.68*  --  2.99*  CALCIUM 9.1 8.9  --  9.5  MG 2.6* 2.6* 2.9*  --   PHOS 1.9* 2.1*  --   --    Liver Function Tests Recent Labs    07/26/23 0420 07/27/23 0530  ALBUMIN 2.6* 2.7*   No results for input(s): "LIPASE", "AMYLASE" in the last 72 hours. Cardiac Enzymes No results for input(s): "CKTOTAL", "CKMB", "CKMBINDEX", "TROPONINI" in the last 72 hours.  BNP: BNP (last 3 results) No results for input(s): "BNP" in the last 8760  hours.  ProBNP (last 3 results) No results for input(s): "PROBNP" in the last 8760 hours.  D-Dimer No results for input(s): "DDIMER" in the last 72 hours. Hemoglobin A1C No results for input(s): "HGBA1C" in the last 72 hours. Fasting Lipid Panel No results for input(s): "CHOL", "HDL", "LDLCALC", "TRIG", "CHOLHDL", "LDLDIRECT" in the last 72 hours. Thyroid Function Tests No results for input(s): "TSH", "T4TOTAL", "T3FREE", "THYROIDAB" in the last 72 hours.  Invalid input(s): "FREET3"  Other results:  Imaging   DG Swallowing Func-Speech Pathology  Result Date: 07/27/2023 Table formatting from the original result was not included. Modified Barium Swallow Study Patient Details Name: [name] MRN: [id number] Date of Birth: 1971/02/18 Today's Date: 07/27/2023 HPI/PMH: HPI: This is a 52 year old gentleman, Patient underwent surgery 8/15  for severe mitral regurgitation severe tricuspid regurgitation and moderate aortic stenosis.  He also has atrial fibrillation received maze procedure.  He had a complex mitral valve repair a tricuspid valve annuloplasty and aortic valve replacement followed by full right and left maze procedure. Extubated 8/17, complicated course since extubation with decreased LOC, need for CRRT, FEES x2 with second test showing Left vocal fold fixedin paramedian position. Past medical history of tracheostomy in 2011 (reason unknown, pt reports "real bad pna"), anxiety depression, bipolar disease, congestive heart failure chronic diastolic heart failure kidney disease followed by renal transplant in 1999 repeat renal transplant in 2015. Clinical Impression: Pt has a moderate oropharyngeal dysphagia characterized by pharyngeal weakness and severe general deconditioning with worsening function as pt fatiuged with the effort of swallowing and protecting airway. Pt has good oral function, but poor epiglottic deflection, reduce base of tongue retraction and weak upper pharyngeal  constrictors leaving moderate to severe residue in the upper pharynx and valleculae. The vestibule is partially unprotected given no epiglottic deflection resulting in penetration. Despite focal fold paresis he did initially have glottic closure and no aspiration with nectar. Pt senses penetrate and residue and coughs spontaneously to eject and executes 5-10 swallows for each teaspoon in attempts to clear residue. The effort immediately fatuged him. He eventually needed to rest and couldnt hold his head up. SLP attempted effortful swallow, head turn and chin tuck without improvement.  Then teaspoons of thin liquids were given after nectar and pt had immediate sensed aspiration and was too weak to eject aspirate which accumulated from residue as well. pt could have 3-4 teaspoons of nectar thick liquids (tea, broth) from a liquid diet, but would not be able to nutritionally sustain himself orally and would need significant gains in strength and endurance to meaningfully improve swallowing function. Factors that may increase risk of adverse event in presence of aspiration Rubye Oaks & Clearance Coots 2021): Factors that may increase risk of adverse event in presence of aspiration Rubye Oaks &  Clearance Coots 2021): Limited mobility; Frail or deconditioned; Dependence for feeding and/or oral hygiene; Inadequate oral hygiene; Weak cough; Poor general health and/or compromised immunity Recommendations/Plan: Swallowing Evaluation Recommendations Swallowing Evaluation Recommendations Recommendations: PO diet PO Diet Recommendation: Full liquid diet; Mildly thick liquids (Level 2, nectar thick) Liquid Administration via: Spoon Medication Administration: Via alternative means Supervision: Full assist for feeding; Staff to assist with self-feeding Swallowing strategies  : Slow rate; Small bites/sips; Hard cough after swallowing Postural changes: Stay upright 30-60 min after meals; Position pt fully upright for meals Oral care recommendations: Oral  care QID (4x/day) Recommended consults: Consider Palliative care Treatment Plan Treatment Plan Treatment recommendations: Therapy as outlined in treatment plan below Follow-up recommendations: Skilled nursing-short term rehab (<3 hours/day) Functional status assessment: Patient has had a recent decline in their functional status and demonstrates the ability to make significant improvements in function in a reasonable and predictable amount of time. Treatment frequency: Min 3x/week Treatment duration: 2 weeks Interventions: Aspiration precaution training; Trials of upgraded texture/liquids; Compensatory techniques; Diet toleration management by SLP; Respiratory muscle strength training Recommendations Recommendations for follow up therapy are one component of a multi-disciplinary discharge planning process, led by the attending physician.  Recommendations may be updated based on patient status, additional functional criteria and insurance authorization. Assessment: Orofacial Exam: Orofacial Exam Oral Cavity: Oral Hygiene: WFL Anatomy: No data recorded Boluses Administered: Boluses Administered Boluses Administered: Thin liquids (Level 0); Mildly thick liquids (Level 2, nectar thick)  Oral Impairment Domain: Oral Impairment Domain Lip Closure: No labial escape Tongue control during bolus hold: Cohesive bolus between tongue to palatal seal Bolus transport/lingual motion: Brisk tongue motion Oral residue: Trace residue lining oral structures Location of oral residue : Tongue Initiation of pharyngeal swallow : Valleculae; Posterior angle of the ramus  Pharyngeal Impairment Domain: Pharyngeal Impairment Domain Soft palate elevation: Trace column of contrast or air between SP and PW; Escape to nasopharynx Laryngeal elevation: Complete superior movement of thyroid cartilage with complete approximation of arytenoids to epiglottic petiole Anterior hyoid excursion: Complete anterior movement Epiglottic movement: Partial  inversion; No inversion Laryngeal vestibule closure: Complete, no air/contrast in laryngeal vestibule Pharyngeal stripping wave : Present - diminished Pharyngoesophageal segment opening: Complete distension and complete duration, no obstruction of flow Tongue base retraction: Trace column of contrast or air between tongue base and PPW Pharyngeal residue: Collection of residue within or on pharyngeal structures Location of pharyngeal residue: Valleculae; Tongue base; Aryepiglottic folds  Esophageal Impairment Domain: No data recorded Pill: No data recorded Penetration/Aspiration Scale Score: Penetration/Aspiration Scale Score 1.  Material does not enter airway: Thin liquids (Level 0); Mildly thick liquids (Level 2, nectar thick) 2.  Material enters airway, remains ABOVE vocal cords then ejected out: Thin liquids (Level 0); Mildly thick liquids (Level 2, nectar thick) 7.  Material enters airway, passes BELOW cords and not ejected out despite cough attempt by patient: Mildly thick liquids (Level 2, nectar thick); Thin liquids (Level 0) 8.  Material enters airway, passes BELOW cords without attempt by patient to eject out (silent aspiration) : Thin liquids (Level 0) Compensatory Strategies: No data recorded  General Information: Caregiver present: Yes (RN)  Diet Prior to this Study: NPO; Cortrak/Small bore NG tube   No data recorded  No data recorded  No data recorded  History of Recent Intubation: No  Behavior/Cognition: Alert; Cooperative Self-Feeding Abilities: Dependent for feeding Baseline vocal quality/speech: Aphonic Volitional Cough: Able to elicit Volitional Swallow: Able to elicit Exam Limitations: Fatigue Goal Planning: Prognosis for improved oropharyngeal function:  Fair Barriers to Reach Goals: Severity of deficits; Overall medical prognosis No data recorded No data recorded Consulted and agree with results and recommendations: Patient; Physician; Nurse Pain: No data recorded End of Session: Start Time:SLP  Start Time (ACUTE ONLY): 1300 Stop Time: SLP Stop Time (ACUTE ONLY): 1317 Time Calculation:SLP Time Calculation (min) (ACUTE ONLY): 17 min Charges: SLP Evaluations $ SLP Speech Visit: 1 Visit SLP Evaluations $MBS Swallow: 1 Procedure $Swallowing Treatment: 1 Procedure SLP visit diagnosis: SLP Visit Diagnosis: Dysphagia, oropharyngeal phase (R13.12) Past Medical History: Past Medical History: Diagnosis Date  ADHD (attention deficit hyperactivity disorder)   Anemia   Anemia, chronic disease 07/13/2011  Anxiety   Arthritis   Bipolar 2 disorder (HCC) 08/18/2011  Diagnosed in 10-26-2010.   Bipolar disorder (HCC)   Blood dyscrasia   Nephrotic Anemia  Blood transfusion   Blood transfusion without reported diagnosis   Chronic diastolic CHF (congestive heart failure) (HCC)   Complication of anesthesia   Woke up intubated gets combative  afraid to be alone   Congestive heart failure (CHF) (HCC) 07/13/2011  Onset 10/26/2005.  Followed by Dr. Eden Emms.  S/p cardiac catheterization in [facility] Dr. Anne Fu..  S/p cardiac catheterization in 2013/10/26 by Nishan.  Echo 26-Oct-2013.   Coronary artery disease   Depression   Dysrhythmia   A. Fib 10-27-2023 with DCCV in May 2024  Erectile dysfunction   ESRD on hemodialysis (HCC) 07/13/2011  Glomerulonephritis at age 65, started HD in 27-Oct-1995.  Deceased donor renal transplant 1998-10-26 at [facility], then kidney failed and went back on HD in 2005/10/26.  L forearm AVF . s/p repeat renal transplant 10/2014 [facility].  GI bleed 11/01/2011  Rectal bleeding with emesis/diarrhea   Heart murmur   Hemodialysis status (HCC)   HSV (herpes simplex virus) infection   Hydrocele, right 11/25/2014  Hypertension   Hypertensive emergency 01/06/2013  Hypoglycemia 01/11/2013  Influenza-like illness 12/18/2013  Mild ascending aorta dilatation (HCC)   NICM (nonischemic cardiomyopathy) (HCC)   Pericardial effusion   a. Mod by echo in 10-26-2013, similar to prior.  Peripheral polyneuropathy 08/04/2016  Pneumonia   Pulmonary edema 01/06/2013  Pulmonary hypertension  associated with end stage renal disease on dialysis [facility]) 07/13/2011  Renal transplant, status post 11/25/2014  Pt with Glomerulonephritis at age 52. Deceased donor renal transplant Oct 26, 1998 right and left renal transplant 10/2014.  Followed by [facility] transplant team; nephrologist is Dr. Lonna Cobb.   Respiratory failure with hypoxia (HCC) 01/06/2013  Shortness of breath 12/12/2012  Sleep apnea   Swollen testicle 07/13/2011  URI (upper respiratory infection) 01/25/2012 Past Surgical History: Past Surgical History: Procedure Laterality Date  ANGIOPLASTY    AORTIC VALVE REPLACEMENT N/A 07/02/2023  Procedure: AORTIC VALVE REPLACEMENT (AVR) inspiris valve size 27;  Surgeon: Eugenio Hoes, MD;  Location: [facility] OR;  Service: Open Heart Surgery;  Laterality: N/A;  BUBBLE STUDY  09/05/2022  Procedure: BUBBLE STUDY;  Surgeon: Sande Rives, MD;  Location: [facility] ENDOSCOPY;  Service: Cardiovascular;;  CARDIOVERSION N/A 04/13/2023  Procedure: CARDIOVERSION;  Surgeon: Jake Bathe, MD;  Location: [facility] INVASIVE CV LAB;  Service: Cardiovascular;  Laterality: N/A;  CHOLECYSTECTOMY    DIALYSIS FISTULA CREATION    EVALUATION UNDER ANESTHESIA WITH HEMORRHOIDECTOMY N/A 03/15/2020  Procedure: CONTROL OF ANAL BLEEDING;  Surgeon: Andria Meuse, MD;  Location: [facility] OR;  Service: General;  Laterality: N/A;  IR FLUORO GUIDE CV LINE LEFT  07/19/2023  IR US GUIDE VASC ACCESS LEFT  07/19/2023  KIDNEY TRANSPLANT  10/17/2014  KNEE SURGERY Right  arthroscopic  LEFT HEART CATHETERIZATION WITH CORONARY ANGIOGRAM N/A 10/01/2013  Procedure: LEFT HEART CATHETERIZATION WITH CORONARY ANGIOGRAM;  Surgeon: Wendall Stade, MD;  Location: [facility] CATH LAB;  Service: Cardiovascular;  Laterality: N/A;  LIGATION OF ARTERIOVENOUS  FISTULA Right 07/11/2023  Procedure: LIGATION OF RIGHT ARTERIOVENOUS  FISTULA;  Surgeon: Chuck Hint, MD;  Location: [facility] OR;  Service: Vascular;  Laterality: Right;  MAZE N/A 06/10/2023  Procedure: MAZE;  Surgeon: Eugenio Hoes, MD;  Location: [facility] OR;  Service: Open Heart Surgery;  Laterality: N/A;  MITRAL VALVE REPAIR N/A 06/20/2023  Procedure: MITRAL VALVE REPAIR (MVR) utilizing Simulis Band size 30mm;  Surgeon: Eugenio Hoes, MD;  Location: [facility] OR;  Service: Open Heart Surgery;  Laterality: N/A;  MOUTH SURGERY    teeth removed  NEPHRECTOMY TRANSPLANTED ORGAN    RIGHT HEART CATH N/A 07/23/2023  Procedure: RIGHT HEART CATH;  Surgeon: Dolores Patty, MD;  Location: [facility] INVASIVE CV LAB;  Service: Cardiovascular;  Laterality: N/A;  RIGHT HEART CATH N/A 07/23/2023  Procedure: RIGHT HEART CATH;  Surgeon: Dolores Patty, MD;  Location: [facility] INVASIVE CV LAB;  Service: Cardiovascular;  Laterality: N/A;  RIGHT HEART CATH N/A 08/03/2023  Procedure: RIGHT HEART CATH;  Surgeon: Laurey Morale, MD;  Location: [facility] INVASIVE CV LAB;  Service: Cardiovascular;  Laterality: N/A;  RIGHT/LEFT HEART CATH AND CORONARY ANGIOGRAPHY N/A 11/24/2022  Procedure: RIGHT/LEFT HEART CATH AND CORONARY ANGIOGRAPHY;  Surgeon: Kathleene Hazel, MD;  Location: [facility] INVASIVE CV LAB;  Service: Cardiovascular;  Laterality: N/A;  TEE WITHOUT CARDIOVERSION N/A 09/05/2022  Procedure: TRANSESOPHAGEAL ECHOCARDIOGRAM (TEE);  Surgeon: Sande Rives, MD;  Location: [facility] ENDOSCOPY;  Service: Cardiovascular;  Laterality: N/A;  TEE WITHOUT CARDIOVERSION N/A 06/30/2023  Procedure: TRANSESOPHAGEAL ECHOCARDIOGRAM;  Surgeon: Eugenio Hoes, MD;  Location: [facility] OR;  Service: Open Heart Surgery;  Laterality: N/A;  TEMPORARY PACEMAKER N/A 06/10/2023  Procedure: TEMPORARY PACEMAKER;  Surgeon: Orbie Pyo, MD;  Location: [facility] INVASIVE CV LAB;  Service: Cardiovascular;  Laterality: N/A;  TRICUSPID VALVE REPLACEMENT N/A 06/17/2023  Procedure: TRICUSPID VALVE REPAIR MC3 Ring size 30;  Surgeon: Eugenio Hoes, MD;  Location: [facility] OR;  Service: Open Heart Surgery;  Laterality: N/A; DeBlois, Riley Nearing 07/27/2023, 2:09 PM   Medications:     Scheduled Medications:  acyclovir  200 mg  Per Tube BID   aspirin  81 mg Per Tube Daily   atorvastatin  10 mg Per Tube QHS   azelastine  2 spray Each Nare BID   Chlorhexidine Gluconate Cloth  6 each Topical Daily   darbepoetin (ARANESP) injection - DIALYSIS  150 mcg Subcutaneous Q Tue-1800   droxidopa  100 mg Oral TID WC   escitalopram  10 mg Per Tube Daily   feeding supplement (PROSource TF20)  60 mL Per Tube Q4H   folic acid  1 mg Per Tube Daily   Gerhardt's butt cream   Topical Daily   guaiFENesin  15 mL Per Tube BID   heparin flush  10 Units Intracatheter Once   heparin  5,000 Units Subcutaneous Q8H   influenza vac split trivalent PF  0.5 mL Intramuscular Once   lidocaine-EPINEPHrine  20 mL Other Once   midodrine  30 mg Per Tube TID   multivitamin  1 tablet Per Tube BID   mouth rinse  15 mL Mouth Rinse 4 times per day   pantoprazole (PROTONIX) IV  40 mg Intravenous Q12H   sodium chloride flush  3 mL Intravenous Q12H   sodium chloride flush  3 mL Intravenous Q12H   thiamine  100 mg Per Tube Daily    Infusions:  sodium chloride 10 mL/hr at 07/28/23 0600   sodium chloride     sodium chloride 10 mL/hr at 07/28/23 0600   albumin human Stopped (06/23/23 0040)   epinephrine Stopped (07/25/23 2033)   feeding supplement (VITAL 1.5 CAL) 60 mL/hr at 07/28/23 0600   vasopressin Stopped (07/28/23 0251)    PRN Medications: sodium chloride, sodium chloride, sodium chloride, acetaminophen (TYLENOL) oral liquid 160 mg/5 mL, albumin human, bisacodyl **OR** bisacodyl, clonazepam, dextrose, docusate, loperamide HCl, ondansetron (ZOFRAN) IV, oxyCODONE, phenol, polyethylene glycol, simethicone, sodium chloride flush, sodium chloride flush  Patient Profile   52 y/o AAM w/ mildly reduced systolic heart failure (EF once 45-50% in 2013 but normal on subsequent echos) valvular heart disease including severe AS, severe MR due to MVP and severe TR, atrial fibrillation, pulmonary hypertension, ESRD s/p failed kidney transplants in 1999 and  again in 2015. 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 EF 55-60% w/ mild RV dysfunction by echo in 6/24.    Admitted for elective triple vale surgery + MAZE. S/p bioprosthetic aortic valve replacement + mitral and tricuspid valve repairs. Post-op course c/b persistent vasoplagic syndrome.   Assessment/Plan   1.  Acute on Chronic Systolic Heart Failure w/ Prominent RV Dysfunction  - LVEF once 45-50% in 2013 but normal on subsequent echos. Pre-op TTE 6/25 EF 55-60%, GIIIDD (Restrictive) RV mildly reduced, mod PH w/ estimated RVSP 48 mmHg  - Intra-op TEE (while on 4 mcg of Epi) LVEF 45%, RV mod-severely reduced, stable AV prosthesis w/ normal gradient and stable valvuloplasty rings w/ mod residual MS, mean gradient 7 mmgH.   - echo 07/07/2023: EF 70-75%, severe concentric LVH, RV mod reduced, RV severely enlarged. LA/RA mod dilated. Large pericardial effusion. No evidence of tamponade.  - Echo 08/29: EF 65-70%, RV moderately reduced, pericardial effusion slightly smaller, no tamponade - RHC c/w high output HF physiology with very high pulmonary pressures 2/2 AV fistula despite severe RV dysfunction - s/p Fistula ligation 07/28/23   - Echo (9/9) with EF 60-65%, moderate RV dysfunction/severe RV enlargement, D-shaped septum, large pericardial effusion without tamponade, stable bioprosthetic AVR, s/p MV repair with mean gradient 8, s/p TV repair.   - 9/16 RHC showed predominantly RV failure but with adequate PAPi. PA pressure is moderately elevated, lower than prior. Cardiac output is also mildly lower s/p ligation of AV fistula.  - Echo (9/17) with  LV EF 60-65%, moderate LVH, D-shaped septum, severe RV enlargement with severely decreased systolic function, IVC dilated, bioprosthetic aortic valve stable, MV repair with no MR/mean gradient 8, s/p TV repair.  - Severe RV failure by last echo, has not improved.  LV systolic function normal. Last RHC 9/16 showed elevated right-sided filling pressures but adequate  PAPi.   CVP down to 7 now on my read.  He is off CVVH now and off vasopressin.  Will need trial of HD in a day or two. If he cannot tolerate iHD this time, will need strong consideration of comfort care transition. - Continue midodrine 30 tid + droxidopa.    2. Valvular Heart Disease  - pre-op diagnosis, severe AS, severe MR 2/2 MVP, severe TR - s/p bioprosthetic AVR, mitral and tricuspid valve repairs - stable gradients on intra-op TEE post repairs - stable on echo 8/22 and 8/27 - stable on echo 9/9, mod MS mG 8.3 mmHg   3. Post-op CHB  - EP following. -  S/p RIJ temp wire 08/27 - EPW left in place d/t concern for risk of tamponade - V paced 80.  Still has TTVP, with CHB.  - Needs to tolerate iHD before any permanent device   4. ESRD on HD  - s/p failed kidney transplants in 1999 and again in 2015 - Has failed iHD once so far and remains on pressors, getting CVVH.   - Now back off CVVH and plan for iHD trial in next day or two.  5. H/o Atrial Fibrillation  - S/p MAZE   6. Large pericardial effusion - seen on echo 06/10/2023. Previously small on echo 06/29/23 - no evidence of tamponade - Limited echo 08/29 stable - Echo 9/9, large pericardial effusion but no tamponade.  - Echo 9/17 with moderate pericardial effusion.  7. Severe PAH - pre-op PA pressures 58/30. With PVR 1.2 CO 10L  - post-op PA 97/33 (55) PCWP 30 PVR 4.2 CO 7L - suspect combination of valvular heart disease and high-output physiology - should improved with AVF ligation -  RHC  PA 65/25 (41), PCWP 15 PVR 4.1  8. GOC - palliative care initially following. Now signed off 9/2 per patient request - remains full code - If unable to tolerate iHD will need Hospice.  9. FEN - Getting tube feeds. - Remains NPO with paralyzed vocal cord and aspiration risk.   Cardiology will follow at a distance, call with additional questions.   CRITICAL CARE Performed by: Marca Ancona  Total critical care time: 35 minutes  Critical  care time was exclusive of separately billable procedures and treating other patients.  Critical care was necessary to treat or prevent imminent or life-threatening deterioration.  Critical care was time spent personally by me on the following activities: development of treatment plan with patient and/or surrogate as well as nursing, discussions with consultants, evaluation of patient's response to treatment, examination of patient, obtaining history from patient or surrogate, ordering and performing treatments and interventions, ordering and review of laboratory studies, ordering and review of radiographic studies, pulse oximetry and re-evaluation of patient's condition.   Marca Ancona 07/28/2023 8:38 AM

## 2023-07-28 NOTE — Progress Notes (Signed)
[address]       [place] [address]             [phone]      4 Days Post-Op  Procedure(s) (LRB): RIGHT HEART CATH (N/A)   Total Length of Stay:  LOS: 36 days    SUBJECTIVE: Has some dizziness when BP low Swallow study noted   Vitals:   07/28/23 0645 07/28/23 0700  BP: (!) 93/39 (!) 89/47  Pulse: 80 80  Resp: (!) 25 (!) 24  Temp:    SpO2: 91% 91%    Intake/Output      09/19 0701 09/20 0700 09/20 0701 09/21 0700   I.V. (mL/kg) 602.5 (8.1)    Other     NG/GT 1538    IV Piggyback     Total Intake(mL/kg) 2140.5 (28.8)    Stool 0    CRRT 747.8    Total Output 747.8    Net +1392.7         Stool Occurrence 1 x        sodium chloride 10 mL/hr at 07/28/23 0600   sodium chloride     sodium chloride 10 mL/hr at 07/28/23 0600   albumin human Stopped (06/23/23 0040)   epinephrine Stopped (07/25/23 2033)   feeding supplement (VITAL 1.5 CAL) 60 mL/hr at 07/28/23 0600   vasopressin Stopped (07/28/23 0251)    CBC    Component Value Date/Time   WBC 10.1 07/28/2023 0431   RBC 2.73 (L) 07/28/2023 0431   HGB 8.8 (L) 07/28/2023 0431   HCT 29.1 (L) 07/28/2023 0431   PLT 259 07/28/2023 0431   MCV 106.6 (H) 07/28/2023 0431   MCH 32.2 07/28/2023 0431   MCHC 30.2 07/28/2023 0431   RDW 20.3 (H) 07/28/2023 0431   LYMPHSABS 1.6 07/18/2023 0423   MONOABS 1.3 (H) 07/18/2023 0423   EOSABS 0.4 07/18/2023 0423   BASOSABS 0.1 07/18/2023 0423   CMP     Component Value Date/Time   NA 131 (L) 07/28/2023 0522   K 5.3 (H) 07/28/2023 0522   CL 99 07/28/2023 0522   CO2 23 07/28/2023 0522   GLUCOSE 106 (H) 07/28/2023 0522   BUN 58 (H) 07/28/2023 0522   CREATININE 2.99 (H) 07/28/2023 0522   CALCIUM 9.5 07/28/2023 0522   CALCIUM 10.4 (H) 07/23/2023 0457   PROT 8.9 (H) 07/18/2023 0423   ALBUMIN 2.7 (L) 07/27/2023 0530   AST 45 (H) 07/18/2023 0423   ALT 17 07/18/2023 0423   ALKPHOS 117 07/18/2023 0423   BILITOT 0.5 07/18/2023 0423   GFRNONAA 25  (L) 07/28/2023 0522   GFRAA 10 (L) 03/16/2020 0825   ABG    Component Value Date/Time   PHART 7.317 (L) 07/11/2023 1346   PCO2ART 48.2 (H) 07/11/2023 1346   PO2ART 103 07/11/2023 1346   HCO3 25.5 07/30/2023 0959   TCO2 27 08/01/2023 0959   ACIDBASEDEF 1.0 07/21/2023 1517   O2SAT 75.3 07/28/2023 0431   CBG (last 3)  Recent Labs    07/27/23 1944 07/27/23 2339 07/28/23 0339  GLUCAP 89 117* 104*     ASSESSMENT: SP MV and TV repair, AVR MAZE Will allow thickened liquids prn Discuss timing of conventional dialysis trial Monday if Renal agrees. Should have palliative care see to have plan for path if fails conventional dialysis OOB as much as possible   Eugenio Hoes, MD 07/28/2023

## 2023-07-28 NOTE — Progress Notes (Signed)
SLP Cancellation Note  Patient Details Name: [name] MRN: [id number] DOB: [birth year]   Cancelled treatment:       Reason Eval/Treat Not Completed: Fatigue/lethargy limiting ability to participate. Pt too lethargic for sips right now. Talked to RN and RD today about permitted PO. Pt only recommended to have nectar thick liquids. Thicker textures may be to difficult to swallow and thinner textures are easier to aspirate. Pt does not have endurance for more than a few spoonfuls. He asked me for eggs but was too sleepy to comprehend why I dont recommend eggs. Will f/u.   Harlon Ditty, MA CCC-SLP  Acute Rehabilitation Services Secure Chat Preferred Office [phone]  Claudine Mouton 07/28/2023, 2:49 PM

## 2023-07-28 NOTE — Progress Notes (Signed)
Physical Therapy Treatment Patient Details Name: [name] MRN: [id number] DOB: [birth year] Today's Date: 07/28/2023   History of Present Illness Pt is a 52 y.o. male admitted 07/03/2023 for same day planned tricuspid valve repair, mitral valve repair, aortic valve replacement, maze, TEE. Post-op course complicated by CHB, shock, AKI, respiratory failure. CRRT initiated 8/16, then resumed 8/26. ETT 8/15-8/17. RHC 9/4 and 9/6. PMH includes CHF, HTN, NICM, polyneuropathy, ESRD on HD (s/p renal transplant 1999, 2015), bipolar, ADHD, anxiety, depression.  CRRT stopped 9/19.    PT Comments  Pt making good gains.  Emphasis on strengthening LE exercises, transitions to EOB, sit to stands at EOB, pre-gait activity in the EVA walker including w/shifts, un-weighting,  marching in place and pivot steps over and to the recliner.     If plan is discharge home, recommend the following: A lot of help with walking and/or transfers;A lot of help with bathing/dressing/bathroom;Assistance with cooking/housework;Assist for transportation;Help with stairs or ramp for entrance   Can travel by private vehicle        Equipment Recommendations  Other (comment) (TBD)    Recommendations for Other Services       Precautions / Restrictions Precautions Precautions: Fall;Sternal Precaution Booklet Issued: No Precaution Comments: External pacer in R neck Restrictions Weight Bearing Restrictions: Yes Other Position/Activity Restrictions: sternal prec     Mobility  Bed Mobility Overal bed mobility: Needs Assistance Bed Mobility: Supine to Sit     Supine to sit: Mod assist, +2 for physical assistance Sit to supine: Mod assist, +2 for safety/equipment   General bed mobility comments: cues for technique,  Pt able to get LE's to EOB, but needed truncal assist to come up and forward due to no ability to build momentum at this time.    Transfers Overall transfer level: Needs assistance Equipment used: Bilateral  platform walker (EVA) Transfers: Sit to/from Stand, Bed to chair/wheelchair/BSC Sit to Stand: Mod assist, +2 physical assistance, +2 safety/equipment   Step pivot transfers: Mod assist, +2 physical assistance       General transfer comment: w/shift--unweighting support.    Ambulation/Gait Ambulation/Gait assistance: Mod assist, +2 physical assistance Gait Distance (Feet): 2 Feet (forward and back) Assistive device: Fara Boros Gait Pattern/deviations: Step-through pattern   Gait velocity interpretation: <1.31 ft/sec, indicative of household ambulator   General Gait Details: sidestepping toward [facility] with mod assist +2 safety with w/shift /stability support.   Stairs             Wheelchair Mobility     Tilt Bed    Modified Rankin (Stroke Patients Only)       Balance Overall balance assessment: Needs assistance Sitting-balance support: No upper extremity supported, Feet supported Sitting balance-Leahy Scale: Fair Sitting balance - Comments: sat with hands in the lap without  LOB or need for assist   Standing balance support: Reliant on assistive device for balance Standing balance-Leahy Scale: Poor Standing balance comment: reliant on external support.  Worked on w/shifting, marching in place with limited knee flexion, stood 1+ min each trial.                            Cognition Arousal: Alert Behavior During Therapy: Flat affect Overall Cognitive Status: Within Functional Limits for tasks assessed  Exercises Other Exercises Other Exercises: hip/knee flexion/ext ROM with graded assist in flex and graded resistance in ext x10 reps bil.    General Comments        Pertinent Vitals/Pain Pain Assessment Pain Assessment: Faces Faces Pain Scale: Hurts little more Pain Location: R knee Pain Descriptors / Indicators: Discomfort, Grimacing Pain Intervention(s): Other (comment) (bracing)     Home Living                          Prior Function            PT Goals (current goals can now be found in the care plan section) Acute Rehab PT Goals PT Goal Formulation: With patient Time For Goal Achievement: 08/10/23 Potential to Achieve Goals: Fair Progress towards PT goals: Progressing toward goals    Frequency    Min 1X/week      PT Plan      Co-evaluation              AM-PAC PT "6 Clicks" Mobility   Outcome Measure  Help needed turning from your back to your side while in a flat bed without using bedrails?: A Lot Help needed moving from lying on your back to sitting on the side of a flat bed without using bedrails?: A Lot Help needed moving to and from a bed to a chair (including a wheelchair)?: A Lot Help needed standing up from a chair using your arms (e.g., wheelchair or bedside chair)?: A Lot Help needed to walk in hospital room?: Total Help needed climbing 3-5 steps with a railing? : Total 6 Click Score: 10    End of Session   Activity Tolerance: Patient limited by fatigue;Patient tolerated treatment well Patient left: in bed;with call bell/phone within reach;with bed alarm set Nurse Communication: Mobility status PT Visit Diagnosis: Other abnormalities of gait and mobility (R26.89);Muscle weakness (generalized) (M62.81)     Time: 3474-2595 PT Time Calculation (min) (ACUTE ONLY): 27 min  Charges:    $Therapeutic Exercise: 8-22 mins $Therapeutic Activity: 8-22 mins PT General Charges $$ ACUTE PT VISIT: 1 Visit                     07/28/2023  [name]., PT Acute Rehabilitation Services [phone]  (office)   Eliseo Gum Delanee Xin 07/28/2023, 5:21 PM

## 2023-07-29 DIAGNOSIS — I4891 Unspecified atrial fibrillation: Secondary | ICD-10-CM | POA: Diagnosis not present

## 2023-07-29 DIAGNOSIS — I340 Nonrheumatic mitral (valve) insufficiency: Secondary | ICD-10-CM | POA: Diagnosis not present

## 2023-07-29 DIAGNOSIS — N289 Disorder of kidney and ureter, unspecified: Secondary | ICD-10-CM | POA: Diagnosis not present

## 2023-07-29 DIAGNOSIS — Z789 Other specified health status: Secondary | ICD-10-CM

## 2023-07-29 DIAGNOSIS — Z711 Person with feared health complaint in whom no diagnosis is made: Secondary | ICD-10-CM

## 2023-07-29 DIAGNOSIS — Z9889 Other specified postprocedural states: Secondary | ICD-10-CM | POA: Diagnosis not present

## 2023-07-29 LAB — GLUCOSE, CAPILLARY
Glucose-Capillary: 100 mg/dL — ABNORMAL HIGH (ref 70–99)
Glucose-Capillary: 137 mg/dL — ABNORMAL HIGH (ref 70–99)
Glucose-Capillary: 184 mg/dL — ABNORMAL HIGH (ref 70–99)
Glucose-Capillary: 117 mg/dL — ABNORMAL HIGH (ref 70–99)
Glucose-Capillary: 117 mg/dL — ABNORMAL HIGH (ref 70–99)
Glucose-Capillary: 126 mg/dL — ABNORMAL HIGH (ref 70–99)
Glucose-Capillary: 109 mg/dL — ABNORMAL HIGH (ref 70–99)

## 2023-07-29 LAB — BASIC METABOLIC PANEL
Anion gap: 9 (ref 5–15)
Anion gap: 11 (ref 5–15)
BUN: 78 mg/dL — ABNORMAL HIGH (ref 6–20)
BUN: 81 mg/dL — ABNORMAL HIGH (ref 6–20)
CO2: 22 mmol/L (ref 22–32)
CO2: 22 mmol/L (ref 22–32)
Calcium: 10.1 mg/dL (ref 8.9–10.3)
Calcium: 9.9 mg/dL (ref 8.9–10.3)
Chloride: 98 mmol/L (ref 98–111)
Chloride: 99 mmol/L (ref 98–111)
Creatinine, Ser: 4.55 mg/dL — ABNORMAL HIGH (ref 0.61–1.24)
Creatinine, Ser: 4.62 mg/dL — ABNORMAL HIGH (ref 0.61–1.24)
GFR, Estimated: 15 mL/min — ABNORMAL LOW (ref >60)
GFR, Estimated: 15 mL/min — ABNORMAL LOW (ref >60)
Glucose, Bld: 101 mg/dL — ABNORMAL HIGH (ref 70–99)
Glucose, Bld: 85 mg/dL (ref 70–99)
Potassium: 6.5 mmol/L (ref 3.5–5.1)
Potassium: 6 mmol/L — ABNORMAL HIGH (ref 3.5–5.1)
Sodium: 129 mmol/L — ABNORMAL LOW (ref 135–145)
Sodium: 132 mmol/L — ABNORMAL LOW (ref 135–145)

## 2023-07-29 LAB — CBC
HCT: 28.6 % — ABNORMAL LOW (ref 39.0–52.0)
Hemoglobin: 8.8 g/dL — ABNORMAL LOW (ref 13.0–17.0)
MCH: 32 pg (ref 26.0–34.0)
MCHC: 30.8 g/dL (ref 30.0–36.0)
MCV: 104 fL — ABNORMAL HIGH (ref 80.0–100.0)
Platelets: 267 10*3/uL (ref 150–400)
RBC: 2.75 MIL/uL — ABNORMAL LOW (ref 4.22–5.81)
RDW: 19.5 % — ABNORMAL HIGH (ref 11.5–15.5)
WBC: 8.9 10*3/uL (ref 4.0–10.5)
nRBC: 0.2 % (ref 0.0–0.2)

## 2023-07-29 LAB — COOXEMETRY PANEL
Carboxyhemoglobin: 2.6 % — ABNORMAL HIGH (ref 0.5–1.5)
Methemoglobin: <0.7 % (ref 0.0–1.5)
O2 Saturation: 78 %
Total hemoglobin: 7.4 g/dL — ABNORMAL LOW (ref 12.0–16.0)

## 2023-07-29 LAB — MRSA NEXT GEN BY PCR, NASAL: MRSA by PCR Next Gen: NOT DETECTED

## 2023-07-29 MED ORDER — HEPARIN SODIUM (PORCINE) 1000 UNIT/ML DIALYSIS
2000.0000 [IU] | INTRAMUSCULAR | Status: DC | PRN
  Filled 2023-07-29: qty 2

## 2023-07-29 MED ORDER — ANTICOAGULANT SODIUM CITRATE 4% (200MG/5ML) IV SOLN: 5.0000 mL | Status: DC | PRN

## 2023-07-29 MED ORDER — DEXTROSE 50 % IV SOLN
1.0000 | Freq: Once | INTRAVENOUS | Status: AC
  Administered 2023-07-29: 50 mL via INTRAVENOUS
  Filled 2023-07-29: qty 50

## 2023-07-29 MED ORDER — NEPRO/CARBSTEADY PO LIQD: 237.0000 mL | Freq: Two times a day (BID) | ORAL | Status: DC

## 2023-07-29 MED ORDER — INSULIN ASPART 100 UNIT/ML IV SOLN
5.0000 [IU] | Freq: Once | INTRAVENOUS | Status: AC
  Administered 2023-07-29: 5 [IU] via INTRAVENOUS

## 2023-07-29 MED ORDER — ALTEPLASE 2 MG IJ SOLR: 2.0000 mg | Freq: Once | INTRAMUSCULAR | Status: DC | PRN

## 2023-07-29 MED ORDER — HEPARIN SODIUM (PORCINE) 1000 UNIT/ML DIALYSIS: 1000.0000 [IU] | INTRAMUSCULAR | Status: DC | PRN

## 2023-07-29 MED ORDER — SODIUM BICARBONATE 8.4 % IV SOLN
50.0000 meq | Freq: Once | INTRAVENOUS | Status: AC
  Administered 2023-07-29: 50 meq via INTRAVENOUS
  Filled 2023-07-29: qty 50

## 2023-07-29 MED ORDER — HEPARIN SODIUM (PORCINE) 1000 UNIT/ML IJ SOLN
INTRAMUSCULAR | Status: AC
  Administered 2023-07-29: 3800 [IU]
  Filled 2023-07-29: qty 4

## 2023-07-29 MED ORDER — HEPARIN SODIUM (PORCINE) 1000 UNIT/ML IJ SOLN: 2000.0000 [IU] | INTRAMUSCULAR | Status: DC | PRN

## 2023-07-29 MED ORDER — SODIUM ZIRCONIUM CYCLOSILICATE 10 G PO PACK
10.0000 g | PACK | Freq: Once | ORAL | Status: AC
  Administered 2023-07-29: 10 g
  Filled 2023-07-29: qty 1

## 2023-07-29 NOTE — Progress Notes (Signed)
eLink Physician-Brief Progress Note Patient Name: [name] DOB: 1971-06-10 MRN: [id number]   Date of Service  07/29/2023  HPI/Events of Note  Notified of hyperkalemia of 6.5 No arrthythmia noted on monitor  eICU Interventions  Hyperkalemia protocol ordered with insulin+glucose, bicarb, lokelma ordered.   Pt due to receive dialysis today, but it is unclear as to what time this would happen.         Stephaine Breshears M DELA CRUZ 07/29/2023, 6:10 AM

## 2023-07-29 NOTE — Progress Notes (Signed)
[facility] Progress Note  Subjective: Seen in room. Off pressors, BP's 90s- 100s.   Vitals:   07/29/23 0900 07/29/23 1000 07/29/23 1100 07/29/23 1112  BP: (!) 107/50 (!) 99/44 (!) 100/41   Pulse: 79 80 79 80  Resp: (!) 28 (!) 23 (!) 23 (!) 21  Temp:    97.7 F (36.5 C)  TempSrc:      SpO2: 96% 96% 93% 94%  Weight:      Height:        Physical Exam:       General adult male in bed Skin turgor poor  Lungs clear bilat  Heart S1S2 Abdomen soft nontender nondistended Extremities no edema appreciated Neuro - resting, somnolent, awakens to voice Access left internal jugular tunneled dialysis catheter    OP HD: [facility] triad MWF 3rd shift 3h    F250     94kg   2K/2.5 Ca bath   RUE AVF    Summary: [name] is a/an 52 y.o. 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 Underwent aortic biovalve replacement, TV repair, MV repari, bilat Maze procedures.    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    # Complete heart block - post-op, per EP/ cardiology, still with temp pacer but plans for permanent option in process pending stable status  # Possible high-output heart failure - per CHF. SP AVF ligation 9/06 by [facility].    # ESRD - pt was too hypotensive for hemodialysis post-op.  CRRT started 8/16 and stopped 07/19/23.  Received HD 9/13 but limited by UF and returned to pressors thereafter.  Resumed CRRT 9/14. CVP's down today and pt looks dry on exam. CRRT stopped again 9/19. Will plan iHD today in ICU. Would favor iHD w/o using pressors or any IV medications for support, which is what will be required in OP setting.   # HD access - AVF ligated for CHF. Now s/p [facility] 07/19/23 with IR. Working well.    # Distributive shock - off pressors. Cont midodrine 30 tid and droxidopa.   # pHTN -  severe by RHC --> s/p course of inhaled NO2.   # Volume - wt's remain mid 70 kgs. CVP measurements labile. No sig change in exam.    # Pericardial effusion - per CT surgery and CHF. Assessed as moderate on last TTE   # Anemia of ESRD - transfuse as needed per primary team. Aranesp increased to 150 mcg weekly on Tuesdays    # MBD ckd -  Phos acceptable.  Calcium as below.  No binders currently as on CRRT - normally on renvela.    # Hypercalcemia - peak Ca 13.8 on 9/13, most likely due to immobility. Pt received calcitonin and  pamidronate and Ca++ is now in the 9-10 range.     Maree Krabbe, MD  07/29/2023, 11:48 AM  Recent Labs  Lab 07/26/23 0420 07/27/23 0530 07/27/23 0558 07/28/23 0431 07/28/23 0522 07/29/23 0421 07/29/23 0845  HGB 8.2*  --    < > 8.8*  --  8.8*  --   ALBUMIN 2.6* 2.7*  --   --   --   --   --   CALCIUM 9.1 8.9  --   --    < >  10.1 9.9  PHOS 1.9* 2.1*  --   --   --   --   --   CREATININE 1.75* 1.68*  --   --    < > 4.55* 4.62*  K 5.0 4.9  --   --    < > 6.5* 6.0*   < > = values in this interval not displayed.    Inpatient medications:  acyclovir  200 mg Per Tube BID   aspirin  81 mg Per Tube Daily   atorvastatin  10 mg Per Tube QHS   azelastine  2 spray Each Nare BID   Chlorhexidine Gluconate Cloth  6 each Topical Daily   Chlorhexidine Gluconate Cloth  6 each Topical Q0600   darbepoetin (ARANESP) injection - DIALYSIS  150 mcg Subcutaneous Q Tue-1800   droxidopa  100 mg Oral TID WC   escitalopram  10 mg Per Tube Daily   folic acid  1 mg Per Tube Daily   Gerhardt's butt cream   Topical Daily   guaiFENesin  15 mL Per Tube BID   heparin flush  10 Units Intracatheter Once   heparin  5,000 Units Subcutaneous Q8H   influenza vac split trivalent PF  0.5 mL Intramuscular Once   lidocaine-EPINEPHrine  20 mL Other Once   midodrine  30 mg Per Tube TID   multivitamin  1 tablet Per Tube BID   mouth rinse  15 mL Mouth Rinse 4 times per day   pantoprazole  (PROTONIX) IV  40 mg Intravenous Q12H   sodium chloride flush  3 mL Intravenous Q12H   sodium chloride flush  3 mL Intravenous Q12H   thiamine  100 mg Per Tube Daily    sodium chloride 10 mL/hr at 07/29/23 1000   sodium chloride     sodium chloride 10 mL/hr at 07/29/23 1000   albumin human Stopped (06/23/23 0040)   epinephrine Stopped (07/25/23 2033)   feeding supplement (VITAL 1.5 CAL) 70 mL/hr at 07/29/23 1000   vasopressin Stopped (07/28/23 1052)   sodium chloride, sodium chloride, sodium chloride, acetaminophen (TYLENOL) oral liquid 160 mg/5 mL, albumin human, bisacodyl **OR** bisacodyl, clonazepam, dextrose, docusate, loperamide HCl, ondansetron (ZOFRAN) IV, oxyCODONE, phenol, polyethylene glycol, simethicone, sodium chloride flush, sodium chloride flush

## 2023-07-29 NOTE — Progress Notes (Signed)
Chaplain responded to a text message from a nurse in 2H, suggesting Chaplin talk to Pt, whom would go under a first attempt of hemodialysis. Pt went to visit Pt and cousin, at bedside. Pt's cousin acknowledges this is one of the last chances they feel may work for Pt. Pt's cousin shared with Chaplain about the stress and sadness of these days at the hospital. Pt's cousin was able to share his hope for this treatment, trusting in his faith beliefs. Since this session would be a long one, Chaplain has been going to respond and serve for other emergency calls. Chaplain knew treatment didn't work and was unsuccessful. Chaplain plans to go back to share some time with the family.

## 2023-07-29 NOTE — Progress Notes (Signed)
Patient ID: [name], male   DOB: Nov 20, 1970, 52 y.o.   MRN: [id number]  TCTS Evening Rounds:  He did not tolerate iHD today with drop of BP to 70's and felt terrible. Had to stop.   Planning mtg again with pt and family and palliative care tomorrow to discuss hospice care.

## 2023-07-29 NOTE — Progress Notes (Signed)
Daily Progress Note   Patient Name: [name]       Date: 07/29/2023 DOB: May 30, 1971  Age: 52 y.o. MRN#: [id number] Attending Physician: Eugenio Hoes, MD Primary Care Physician: Donne Anon, MD Admit Date: 06/19/2023  Reason for Consultation/Follow-up: Establishing goals of care  Subjective: I have reviewed medical records including EPIC notes, MAR, and labs. Patient has been off pressors since yesterday evening. Received report from primary RN - no acute concerns. Plan is for attempt at [facility] today.  12:20 PM Went to visit patient and family at bedside for meeting - father/[name], aunt/[name], mother/[name], uncle/James, aunt/Bonnie present. Patient was lying in bed - he intermittently falls asleep during meeting but is easily arousable - when awake, he is alert, oriented, and able to participate in conversation. No signs or non-verbal gestures of pain or discomfort noted. No respiratory distress, increased work of breathing, or secretions noted. Coretrak in place.  Emotional support provided to patient and family. Reviewed patient current acute situation regarding unstable VS in relation to HD sessions requiring start/stop of CRRT over the last several weeks. Patient and family understand that his heart is severely weakened and each HD session he is at risk for continued deterioration. Allowed space and time for patient and family to review information given to them during previous PMT and provider discussions. Patient and family have a clear understanding of his current acute medical situation. They understand CRRT is not a long term option.   Therapeutic listening provided as patient reflects on his dialysis history of 30 years.  Goal for today is trial another session of iHD. We discussed  the different outcomes of this session: either patient will tolerate or he will not. Goals were discussed in detail regarding either outcome. If patient tolerates session today, goal is to take things day by day making stepwise decisions pending his clinical status. If patient does not tolerate iHD today, he would not want CRRT restarted and he would be ready for hospice care. Once transitioned to comfort/hospice, his ultimate goal is to be "close to all my family."  Provided education and counseling at length on the philosophy and benefits of hospice care. Discussed that it offers a holistic approach to care in the setting of end-stage illness, and is about supporting the patient where they are allowing nature to take it's  course. Discussed the hospice team includes RNs, physicians, social workers, and chaplains. They can provide personal care, support for the family, and help keep patient out of the hospital as well as assist with DME needs for home hospice. Education provided on the difference between home vs residential hospice.   We talked about transition to comfort measures in house and what that would entail inclusive of medications to control pain, dyspnea, agitation, nausea, and itching. We discussed stopping all unnecessary measures such as blood draws, further dialysis, needle sticks, oxygen, antibiotics, CBGs/insulin, cardiac monitoring, IVF, and frequent vital signs. Education provided that other non-pharmacological interventions would be utilized for holistic support and comfort such as spiritual support if requested, repositioning, music therapy, offering comfort feeds, and/or therapeutic listening. All care would focus on how the patient is looking and feeling.   Scheduled a follow up meeting with patient and family for tomorrow 9/22 at 2pm to discuss outcomes from session today and next steps. If patient does not tolerate session today, he indicates he will be ready for transition to full  comfort tomorrow and transfer to hospice facility. His family lives in [place] and they would want to find a facility located close to [place].  Encouraged patient to consider DNR/DNI status understanding evidenced based poor outcomes in similar hospitalized patient, as the cause of arrest is likely associated with advanced chronic/terminal illness rather than an easily reversible acute cardio-pulmonary event.  I shared that even if we pursued resuscitation we would not able to resolve the underlying factors. I explained that DNR/DNI does not change the medical plan and it only comes into effect after a person has arrested (died).  It is a protective measure to keep [place] from harming the patient in their last moments of life. Patient asked to speak with his family privately. Stepped out of room per patient's requests.  When ready family indicated patient was ready for PMT to re-enter and continue discussions. Patient was not agreeable to DNR/DNI with understanding that he would receive CPR, defibrillation, ACLS medications, and/or intubation. He states "If  I code today I want you to try and resuscitate me - we will go from there tomorrow."  Family are understandably tearful and respect patient's goals/wishes as noted above.  Offered chaplain support - family agreeable and appreciative.  All questions and concerns addressed. Encouraged to call with questions and/or concerns. PMT card provided.  On call chaplain notified/Carlos - updated on case.  Length of Stay: 37  Current Medications: Scheduled Meds:   acyclovir  200 mg Per Tube BID   aspirin  81 mg Per Tube Daily   atorvastatin  10 mg Per Tube QHS   azelastine  2 spray Each Nare BID   Chlorhexidine Gluconate Cloth  6 each Topical Daily   Chlorhexidine Gluconate Cloth  6 each Topical Q0600   darbepoetin (ARANESP) injection - DIALYSIS  150 mcg Subcutaneous Q Tue-1800   droxidopa  100 mg Oral TID WC   escitalopram  10 mg Per Tube  Daily   folic acid  1 mg Per Tube Daily   Gerhardt's butt cream   Topical Daily   guaiFENesin  15 mL Per Tube BID   heparin flush  10 Units Intracatheter Once   heparin  5,000 Units Subcutaneous Q8H   influenza vac split trivalent PF  0.5 mL Intramuscular Once   lidocaine-EPINEPHrine  20 mL Other Once   midodrine  30 mg Per Tube TID   multivitamin  1 tablet Per Tube BID  mouth rinse  15 mL Mouth Rinse 4 times per day   pantoprazole (PROTONIX) IV  40 mg Intravenous Q12H   sodium chloride flush  3 mL Intravenous Q12H   sodium chloride flush  3 mL Intravenous Q12H   thiamine  100 mg Per Tube Daily    Continuous Infusions:  sodium chloride 10 mL/hr at 07/29/23 1000   sodium chloride     sodium chloride 10 mL/hr at 07/29/23 1000   albumin human Stopped (06/23/23 0040)   anticoagulant sodium citrate     epinephrine Stopped (07/25/23 2033)   feeding supplement (VITAL 1.5 CAL) 70 mL/hr at 07/29/23 1000   vasopressin Stopped (07/28/23 1052)    PRN Meds: sodium chloride, sodium chloride, sodium chloride, acetaminophen (TYLENOL) oral liquid 160 mg/5 mL, albumin human, alteplase, anticoagulant sodium citrate, bisacodyl **OR** bisacodyl, clonazepam, dextrose, docusate, heparin, [START ON 07/30/2023] heparin, loperamide HCl, ondansetron (ZOFRAN) IV, oxyCODONE, phenol, polyethylene glycol, simethicone, sodium chloride flush, sodium chloride flush  Physical Exam Vitals and nursing note reviewed.  Constitutional:      General: He is not in acute distress.    Appearance: He is cachectic. He is ill-appearing.  Pulmonary:     Effort: No respiratory distress.  Skin:    General: Skin is warm and dry.  Neurological:     Mental Status: He is alert and oriented to person, place, and time.     Motor: Weakness present.  Psychiatric:        Attention and Perception: Attention normal.        Behavior: Behavior is cooperative.        Cognition and Memory: Cognition and memory normal.              Vital Signs: BP (!) 100/41   Pulse 80   Temp 97.7 F (36.5 C)   Resp (!) 21   Ht 6\' 3"  (1.905 m)   Wt 75 kg   SpO2 94%   BMI 20.67 kg/m  SpO2: SpO2: 94 % O2 Device: O2 Device: Nasal Cannula O2 Flow Rate: O2 Flow Rate (L/min): 2 L/min  Intake/output summary:  Intake/Output Summary (Last 24 hours) at 07/29/2023 1218 Last data filed at 07/29/2023 1000 Gross per 24 hour  Intake 2007.84 ml  Output --  Net 2007.84 ml   LBM: Last BM Date : 07/27/23 Baseline Weight: Weight: 95 kg Most recent weight: Weight: 75 kg       Palliative Assessment/Data: PPS 30% with tube feeds      Patient Active Problem List   Diagnosis Date Noted   Atrial fibrillation (HCC) 07/27/2023   ESRD (end stage renal disease) on dialysis (HCC) 07/20/2023   Severe protein-calorie malnutrition (HCC) 07/20/2023   Pressure injury of skin 07/20/2023   Cardiogenic shock (HCC) 07/13/2023   Acute metabolic encephalopathy 08/04/2023   ESRD (end stage renal disease) (HCC) 07/28/2023   Acute respiratory failure with hypoxia and hypercapnia (HCC) 07/30/2023   Physical deconditioning 07/10/2023   Acute respiratory failure with hypoxia (HCC) 07/10/2023   Shock (HCC) 07/06/2023   RVF (right ventricular failure) (HCC) 07/06/2023   AKI (acute kidney injury) (HCC) 07/06/2023   Protein-calorie malnutrition, severe 07/03/2023   Malnutrition of moderate degree 06/28/2023   S/P MVR (mitral valve repair) 06/23/2023   S/P AVR (aortic valve replacement) 06/23/2023   S/P Maze operation for atrial fibrillation 06/23/2023   Renal disease 06/23/2023   Tricuspid valve insufficiency 06/23/2023   S/P tricuspid valve repair 07/05/2023   Typical atrial flutter (HCC) 03/17/2023  Severe mitral regurgitation 11/24/2022   Rectal bleed 03/15/2020   Peripheral polyneuropathy 08/04/2016   Pericardial effusion    Renal transplant, status post 11/25/2014   Hydrocele, right 11/25/2014   Health care maintenance 09/10/2014    Influenza-like illness 12/18/2013   Bipolar 2 disorder (HCC) 08/18/2011   ESRD on hemodialysis (HCC) 07/13/2011   Congestive heart failure (CHF) (HCC) 07/13/2011   Anemia, chronic disease 07/13/2011   Hypertension 07/13/2011    Palliative Care Assessment & Plan   Patient Profile: 52 y.o. male  with past medical history of ESRD on HD 20 year s/p failed renal transplant in 1999 and 2015, moderate pulmonary HTN, severe MR, TR, and severe AS s/p repairs, anxiety, depression, bipolar disorder, CAD, afib s/p MAZE admitted on 06/11/2023 with fatigue and DOE due to valvular disease.    Patient underwent MVR/AVR/TVR 8/15 with post-op course complicated by distributive shock and vasospastic syndrome. He underwent temporary pacemaker placement on 8/27 for heart block and was on CRRT 8/16-8/25, then resumed on 8/26 for optimization of hemodynamics and reduction of pressor requirements. There is concern for AV fistula in the setting of high output HF.   PMT has been consulted to assist with goals of care conversation on 8/29. Initial request from TCTS was to defer initial visit to 9/2, however patient's pressor requirements then increased overnight and PMT was asked to see by AHF team on 8/30.  Assessment: Principal Problem:   S/P tricuspid valve repair Active Problems:   S/P MVR (mitral valve repair)   S/P AVR (aortic valve replacement)   S/P Maze operation for atrial fibrillation   Renal disease   Tricuspid valve insufficiency   Malnutrition of moderate degree   Protein-calorie malnutrition, severe   Shock (HCC)   RVF (right ventricular failure) (HCC)   AKI (acute kidney injury) (HCC)   Physical deconditioning   Acute respiratory failure with hypoxia (HCC)   Acute metabolic encephalopathy   ESRD (end stage renal disease) (HCC)   Acute respiratory failure with hypoxia and hypercapnia (HCC)   Cardiogenic shock (HCC)   ESRD (end stage renal disease) on dialysis (HCC)   Severe protein-calorie  malnutrition (HCC)   Pressure injury of skin   Atrial fibrillation (HCC)   Concern about end of life  Recommendations/Plan: Plan for another attempt at [facility] today. Patient will make stepwise decisions pending outcomes If session is tolerated: will take things day by day and make decisions pending clinical status If session is not tolerated: does not want to restart CRRT. Would be interested in transition to full comfort and residential hospice facility Follow up meeting scheduled with patient and his family tomorrow 9/22 at 2pm to discuss outcomes from [facility] today and next steps Continue full code - discussions ongoing Chaplain consult placed and notified of patient/family request for emotional support PMT will continue to follow and support holistically   Goals of Care and Additional Recommendations: Limitations on Scope of Treatment: Full Scope Treatment  Code Status:    Code Status Orders  (From admission, onward)           Start     Ordered   07/03/2023 1544  Full code  Continuous       Question:  By:  Answer:  Other   06/25/2023 1543           Code Status History     Date Active Date Inactive Code Status Order ID Comments User Context   11/24/2022 1430 11/24/2022 2224 Full Code [id number]  [place],  Nile Dear, MD Inpatient   03/15/2020 1059 03/16/2020 1718 Full Code [id number]  Bridget Hartshorn, DO ED   12/18/2013 1420 12/20/2013 1435 Full Code [id number]  Andrena Mews, DO Inpatient   01/07/2013 2248 01/08/2013 1330 Full Code [id number]  Gerhard Munch, MD ED   01/05/2013 2025 01/07/2013 2035 Full Code [id number]  Carolan Clines, MD ED       Prognosis:  Poor  Discharge Planning: To Be Determined  Care plan was discussed with primary RN, Dr. Leafy Ro, Dr. Arlean Hopping, patient, patient's family  Thank you for allowing the Palliative Medicine Team to assist in the care of this patient.   Total Time 70 minutes Prolonged Time Billed  yes       Greater than 50%  of this  time was spent counseling and coordinating care related to the above assessment and plan.  Haskel Khan, NP  Please contact Palliative Medicine Team phone at [phone] for questions and concerns.   *Portions of this note are a verbal dictation therefore any spelling and/or grammatical errors are due to the "Dragon [employer] One" system interpretation.

## 2023-07-29 NOTE — Progress Notes (Signed)
5 Days Post-Op Procedure(s) (LRB): RIGHT HEART CATH (N/A) Subjective:  Some pain over sacrum probably related to pressure sore. Getting ready to start iHD.  Objective: Vital signs in last 24 hours: Temp:  [96.8 F (36 C)-97.9 F (36.6 C)] 97.7 F (36.5 C) (09/21 1112) Pulse Rate:  [79-80] 80 (09/21 1112) Cardiac Rhythm: Ventricular paced (09/21 0800) Resp:  [17-28] 21 (09/21 1112) BP: (82-114)/(35-72) 100/41 (09/21 1100) SpO2:  [89 %-98 %] 94 % (09/21 1112) Weight:  [75 kg] 75 kg (09/21 0800)  Hemodynamic parameters for last 24 hours: CVP:  [1 mmHg-55 mmHg] 41 mmHg  Intake/Output from previous day: 09/20 0701 - 09/21 0700 In: 2323.4 [I.V.:499.2; NG/GT:1824.2] Out: 0  Intake/Output this shift: Total I/O In: 270 [I.V.:60; NG/GT:210] Out: -   General appearance: slowed mentation Neurologic: intact Heart: regular rate and rhythm Lungs: clear to auscultation bilaterally Wound: chest incision healing well  Lab Results: Recent Labs    07/28/23 0431 07/29/23 0421  WBC 10.1 8.9  HGB 8.8* 8.8*  HCT 29.1* 28.6*  PLT 259 267   BMET:  Recent Labs    07/29/23 0421 07/29/23 0845  NA 129* 132*  K 6.5* 6.0*  CL 98 99  CO2 22 22  GLUCOSE 101* 85  BUN 78* 81*  CREATININE 4.55* 4.62*  CALCIUM 10.1 9.9    PT/INR: No results for input(s): "LABPROT", "INR" in the last 72 hours. ABG    Component Value Date/Time   PHART 7.317 (L) 07/11/2023 1346   HCO3 25.5 07/22/2023 0959   TCO2 27 08/03/2023 0959   ACIDBASEDEF 1.0 08/05/2023 1517   O2SAT 78 07/29/2023 0420   CBG (last 3)  Recent Labs    07/29/23 0337 07/29/23 0731 07/29/23 1105  GLUCAP 137* 184* 117*    Assessment/Plan:  Hemodynamics have been stable with borderline low BP low 100's.  Planning first iHD this afternoon.  Tolerating tube feeds.   LOS: 37 days    Alleen Borne 07/29/2023

## 2023-07-29 NOTE — Progress Notes (Addendum)
Patients family member called wanting to know an exact date or rang of patients life expectancy after stopping dialysis. I explained to family member that I do not have access to information regarding this patients life expectancy once dialysis is stopped. Family member began to get verbally aggressive stating "no one there is educated enough to know exactly what life expectancy is once dialysis is stopped" I asked family member to try and understand that each patients medical presentation is different and an estimated life expectancy quote isnt realistic.   Family member became increasingly verbally aggressive and states "you mean to tell me, out of all the doctors, PA's, and nurses there, no one is educated enough to read a fucking medical article to know how long someone can live after dialysis is stopped"  This nurse ended the phone call after this patients family member began to curse and increase in hostility.

## 2023-07-29 NOTE — Progress Notes (Signed)
NAME:  [name], MRN:  [id number], DOB:  October 29, 1971, LOS: 37 ADMISSION DATE:  07/06/2023, CONSULTATION DATE: 07/05/2023 REFERRING MD: Leafy Ro - TCTS, CHIEF COMPLAINT: Postcardiac surgery  History of Present Illness:  This is a 52 year old gentleman, past medical history of anxiety depression, bipolar disease, congestive heart failure chronic diastolic heart failure kidney disease followed by renal transplant in 10-03-1998 repeat renal transplant in 10/03/14.  Patient underwent surgery today for severe mitral regurgitation severe tricuspid regurgitation and moderate aortic stenosis.  He also has atrial fibrillation received maze procedure.  He had a complex mitral valve repair a tricuspid valve annuloplasty and aortic valve replacement followed by full right and left maze procedure.  Pertinent Medical History:   Past Medical History:  Diagnosis Date   ADHD (attention deficit hyperactivity disorder)    Anemia    Anemia, chronic disease 07/13/2011   Anxiety    Arthritis    Bipolar 2 disorder (HCC) 08/18/2011   Diagnosed in 10-03-2010.    Bipolar disorder (HCC)    Blood dyscrasia    Nephrotic Anemia   Blood transfusion    Blood transfusion without reported diagnosis    Chronic diastolic CHF (congestive heart failure) (HCC)    Complication of anesthesia    Woke up intubated gets combative  afraid to be alone    Congestive heart failure (CHF) (HCC) 07/13/2011   Onset 10-03-05.  Followed by Dr. Eden Emms.  S/p cardiac catheterization in [facility] Dr. Anne Fu..  S/p cardiac catheterization in 03-Oct-2013 by Nishan.  Echo 03-Oct-2013.    Coronary artery disease    Depression    Dysrhythmia    A. 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  Deceased donor renal transplant 1998/10/03 at [facility], then kidney failed and went back on HD in 10-03-05.  L forearm AVF . s/p repeat renal transplant 10/2014 [facility].   GI bleed 11/01/2011   Rectal  bleeding with emesis/diarrhea    Heart murmur    Hemodialysis status (HCC)    HSV (herpes simplex virus) infection    Hydrocele, right 11/25/2014   Hypertension    Hypertensive emergency 01/06/2013   Hypoglycemia 01/11/2013   Influenza-like illness 12/18/2013   Mild ascending aorta dilatation (HCC)    NICM (nonischemic cardiomyopathy) (HCC)    Pericardial effusion    a. Mod by echo in 2013-10-03, similar to prior.   Peripheral polyneuropathy 08/04/2016   Pneumonia    Pulmonary edema 01/06/2013   Pulmonary hypertension associated with end stage renal disease on dialysis [facility]) 07/13/2011   Renal transplant, status post 11/25/2014   Pt with Glomerulonephritis at age 57. Deceased donor renal transplant 10-03-1998 right and left renal transplant 10/2014.  Followed by [facility] transplant team; nephrologist is Dr. Lonna Cobb.    Respiratory failure with hypoxia (HCC) 01/06/2013   Shortness of breath 12/12/2012   Sleep apnea    Swollen testicle 07/13/2011   URI (upper respiratory infection) 01/25/2012    Significant Hospital Events: Including procedures, antibiotic start and stop dates in addition to other pertinent events   8/15 - AVR, MVR, TVR with TCTS ([place]). Remains on multiple pressors (NE, Epi, vaso). 8/16 - [facility] Trialysis catheter placed for CRRT.  8/17 extubated  8/18 - remains on CVVHD, 3 pressors  8/19-8/20 improving pressor needs 8/21 agitated delirium; cortrak 8/26 CRRT restarted 8/27 TVP placed, brady overnight and pacing turned to 80 8/30 palli consult  9/1 PRBC for low coox  9/2 coox down a bit again  9/3CRRT off overnight due to access issues. Remains on vaso 0.04, epi 10, norepi 2, DBA 5. 9/4 RHC, started iNO 9/6 repeat RHC, fistula ligated 9/9 Remains on vaso 0.04, epi . 30 midodrine TID, Temp pacer, small effusions on CXR , mild interstitial edema, left lower lobe consolidation most likely atelectasis , afebrile 9/10 intermittent vasopressor. Echo continue to show  severe RV dysfunction. 9/11 [place] cvc to fem site in prep for possible temp perm  9/12 intolerant of EN, vomiting.  9/16 right heart cath:   RHC Procedural Findings (epinephrine 1, vasopressin 0.02): Hemodynamics (mmHg) RA mean 15 RV 64/17 PA 65/25, mean 41 PCWP mean 15 Oxygen saturations: PA 55% AO 94% Cardiac Output (Fick) 6.35  Cardiac Index (Fick) 3.16  Cardiac Output (Thermo) 6.39  Cardiac Index (Thermo) 3.17 PVR 4.1 WU  9/17 Assessed by SLP. Still not ready for swallow eval. Epinephrine off added droxidopa    Interim History / Subjective:  off pressors. Denies significant pain.  Objective:  Blood pressure (!) 105/54, pulse 79, temperature (!) 97.5 F (36.4 C), temperature source Axillary, resp. rate (!) 26, height 6\' 3"  (1.905 m), weight 74.2 kg, SpO2 92%. CVP:  [1 mmHg-55 mmHg] 11 mmHg      Intake/Output Summary (Last 24 hours) at 07/29/2023 0748 Last data filed at 07/29/2023 0700 Gross per 24 hour  Intake 2323.36 ml  Output 0 ml  Net 2323.36 ml   Filed Weights   07/25/23 0354 07/26/23 0500 07/27/23 0500  Weight: 73.7 kg 73.8 kg 74.2 kg   Physical Examination:  Chronically ill Weak Abd soft Eating dysphagia diet +muscle wasting +murmur, ext warm All stable  K is up, receiving temporizing measures CBC stable No new imaging  Resolved problem list   Acute resp failure w hypoxia and hypercarbia Pericardial effusion without tamponade   Assessment & Plan:   Cardiogenic shock due to biventricular HF  w/ refractory vasoplegia  s/p MVR TVR AS s/p bioprosthetic replacements & MAZE procedure  c/b CHB, severe pHTN- improved a bit after fistula closure ESRD on iHD CKD s/p failed Rtxp 1999, 2015  H/o HSV Anemia, multifactorial Severe kcal/protein malnutrition EN intolerance w/ nausea and vomiting Hypercalcemia- immobility probably primary driving factor Physical deconditioning due to prolonged critical illness + significant chronic dz burden, dysphagia   -  iHD and will see if can tolerate without pressors; K temporizing measures while we wait for HD - Continue midodrine, droxidopa - Continue GOC discussions - Will need extensive rehab - Remains paced, temp-perm at some point if can tolerate HD - Guarded prognosis remains - Will follow while in ICU  Best Practice (right click and "Reselect all SmartList Selections" daily)   Diet/type:TF  DVT prophylaxis: heparin sq GI prophylaxis: PPI Lines: Central line and Dialysis Catheter Foley:  No  Code Status:  full code Last date of multidisciplinary goals of care discussion: PMT following  Myrla Halsted MD PCCM [facility] 07/29/2023, 7:48 AM  Please see [email or web address] for pager details.  From 7A-7P if no response, please call [phone]. After hours, please call ELink [phone].

## 2023-07-29 NOTE — Progress Notes (Addendum)
Pt did not tolerate today's attempt at dialysis.  He had symptomatic hypotension w/ mild UF attempt early in the treatment and he became unresponsive. UF was turned off and he was responsive again. Before we could attempt another UF attempt, Bp's dropped on their own into high 60's and the pt was symptomatic and looked bad and so dialysis was aborted and blood returned. Pt is not able to tolerate regular dialysis. Would not attempt any further HD or CRRT.    [name]  MD  [facility] 07/29/2023, 4:41 PM

## 2023-07-29 NOTE — Procedures (Signed)
HD Note:  Some information was entered later than the data was gathered due to patient care needs. The stated time with the data is accurate.  Received treatment at bedside   Alert and oriented.   Informed consent signed and in chart.   Access used: Upper left chest HD catheter Access issues: Venous line does not allow any pulling of liquid.  Heparin retrieved from this line at the start of treatment with difficulty  Patient hypotension did not allow for UF removal.  NS had to be given back to sustain BP.   Dr. Arlean Hopping consulted concerning patient's hypotension.   Patient stated he wasn't feeling well and BP 69/33 with no UF being removed.  Treatment stopped.  TX duration: 1.5 hours  Alert, without acute distress.  Patient received  Hand-off given to patient's nurse.     Shedrick Sarli L. Dareen Piano, RN [facility].

## 2023-07-30 DIAGNOSIS — Z9889 Other specified postprocedural states: Secondary | ICD-10-CM | POA: Diagnosis not present

## 2023-07-30 DIAGNOSIS — I340 Nonrheumatic mitral (valve) insufficiency: Secondary | ICD-10-CM | POA: Diagnosis not present

## 2023-07-30 DIAGNOSIS — I4891 Unspecified atrial fibrillation: Secondary | ICD-10-CM | POA: Diagnosis not present

## 2023-07-30 DIAGNOSIS — Z66 Do not resuscitate: Secondary | ICD-10-CM

## 2023-07-30 DIAGNOSIS — N289 Disorder of kidney and ureter, unspecified: Secondary | ICD-10-CM | POA: Diagnosis not present

## 2023-07-30 LAB — COOXEMETRY PANEL
Carboxyhemoglobin: 2.5 % — ABNORMAL HIGH (ref 0.5–1.5)
Methemoglobin: <0.7 % (ref 0.0–1.5)
O2 Saturation: 78.2 %
Total hemoglobin: 9.2 g/dL — ABNORMAL LOW (ref 12.0–16.0)

## 2023-07-30 LAB — CBC
HCT: 28.2 % — ABNORMAL LOW (ref 39.0–52.0)
Hemoglobin: 8.6 g/dL — ABNORMAL LOW (ref 13.0–17.0)
MCH: 32 pg (ref 26.0–34.0)
MCHC: 30.5 g/dL (ref 30.0–36.0)
MCV: 104.8 fL — ABNORMAL HIGH (ref 80.0–100.0)
Platelets: 235 10*3/uL (ref 150–400)
RBC: 2.69 MIL/uL — ABNORMAL LOW (ref 4.22–5.81)
RDW: 19.1 % — ABNORMAL HIGH (ref 11.5–15.5)
WBC: 7 10*3/uL (ref 4.0–10.5)
nRBC: 0 % (ref 0.0–0.2)

## 2023-07-30 LAB — GLUCOSE, CAPILLARY
Glucose-Capillary: 136 mg/dL — ABNORMAL HIGH (ref 70–99)
Glucose-Capillary: 131 mg/dL — ABNORMAL HIGH (ref 70–99)
Glucose-Capillary: 115 mg/dL — ABNORMAL HIGH (ref 70–99)
Glucose-Capillary: 142 mg/dL — ABNORMAL HIGH (ref 70–99)

## 2023-07-30 MED ORDER — LORAZEPAM 2 MG/ML IJ SOLN
1.0000 mg | INTRAMUSCULAR | Status: DC | PRN
  Administered 2023-07-31: 1 mg via INTRAVENOUS
  Filled 2023-07-30: qty 1

## 2023-07-30 MED ORDER — HYDROMORPHONE HCL 1 MG/ML IJ SOLN
0.5000 mg | INTRAMUSCULAR | Status: DC | PRN
  Administered 2023-07-30 – 2023-07-31 (×6): 1 mg via INTRAVENOUS
  Filled 2023-07-30 (×6): qty 1

## 2023-07-30 MED ORDER — LORAZEPAM 2 MG/ML PO CONC: 1.0000 mg | ORAL | Status: DC | PRN

## 2023-07-30 MED ORDER — ONDANSETRON 4 MG PO TBDP: 4.0000 mg | ORAL_TABLET | Freq: Four times a day (QID) | ORAL | Status: DC | PRN

## 2023-07-30 MED ORDER — HALOPERIDOL LACTATE 5 MG/ML IJ SOLN: 2.0000 mg | Freq: Four times a day (QID) | INTRAMUSCULAR | Status: DC | PRN

## 2023-07-30 MED ORDER — GUAIFENESIN 100 MG/5ML PO LIQD: 15.0000 mL | ORAL | Status: DC | PRN

## 2023-07-30 MED ORDER — GLYCOPYRROLATE 0.2 MG/ML IJ SOLN
0.2000 mg | INTRAMUSCULAR | Status: DC | PRN
  Filled 2023-07-30: qty 1

## 2023-07-30 MED ORDER — ACETAMINOPHEN 325 MG PO TABS: 650.0000 mg | ORAL_TABLET | Freq: Four times a day (QID) | ORAL | Status: DC | PRN

## 2023-07-30 MED ORDER — OXYCODONE HCL 5 MG PO TABS: 5.0000 mg | ORAL_TABLET | ORAL | Status: DC | PRN

## 2023-07-30 MED ORDER — DIPHENHYDRAMINE HCL 50 MG/ML IJ SOLN: 12.5000 mg | INTRAMUSCULAR | Status: DC | PRN

## 2023-07-30 MED ORDER — MIDODRINE HCL 5 MG PO TABS
30.0000 mg | ORAL_TABLET | Freq: Three times a day (TID) | ORAL | Status: DC
  Administered 2023-07-30: 30 mg via ORAL
  Filled 2023-07-30: qty 6

## 2023-07-30 MED ORDER — LORAZEPAM 1 MG PO TABS: 1.0000 mg | ORAL_TABLET | ORAL | Status: DC | PRN

## 2023-07-30 MED ORDER — ONDANSETRON HCL 4 MG/2ML IJ SOLN: 4.0000 mg | Freq: Four times a day (QID) | INTRAMUSCULAR | Status: DC | PRN

## 2023-07-30 MED ORDER — LOPERAMIDE HCL 1 MG/7.5ML PO SUSP: 1.0000 mg | ORAL | Status: DC | PRN

## 2023-07-30 MED ORDER — GLYCOPYRROLATE 1 MG PO TABS: 1.0000 mg | ORAL_TABLET | ORAL | Status: DC | PRN

## 2023-07-30 MED ORDER — ACETAMINOPHEN 650 MG RE SUPP: 650.0000 mg | Freq: Four times a day (QID) | RECTAL | Status: DC | PRN

## 2023-07-30 MED ORDER — BIOTENE DRY MOUTH MT LIQD
15.0000 mL | Freq: Two times a day (BID) | OROMUCOSAL | Status: DC
  Administered 2023-07-30 – 2023-07-31 (×2): 15 mL via TOPICAL
  Filled 2023-07-30: qty 15

## 2023-07-30 MED ORDER — NEPRO/CARBSTEADY PO LIQD: 237.0000 mL | ORAL | Status: DC | PRN

## 2023-07-30 MED ORDER — HALOPERIDOL 1 MG PO TABS: 2.0000 mg | ORAL_TABLET | Freq: Four times a day (QID) | ORAL | Status: DC | PRN

## 2023-07-30 MED ORDER — SIMETHICONE 40 MG/0.6ML PO SUSP: 40.0000 mg | Freq: Four times a day (QID) | ORAL | Status: DC | PRN

## 2023-07-30 MED ORDER — POLYVINYL ALCOHOL 1.4 % OP SOLN: 1.0000 [drp] | Freq: Four times a day (QID) | OPHTHALMIC | Status: DC | PRN

## 2023-07-30 MED ORDER — HALOPERIDOL LACTATE 2 MG/ML PO CONC: 2.0000 mg | Freq: Four times a day (QID) | ORAL | Status: DC | PRN

## 2023-07-30 MED ORDER — GLYCOPYRROLATE 0.2 MG/ML IJ SOLN
0.2000 mg | INTRAMUSCULAR | Status: DC | PRN
  Administered 2023-07-30 – 2023-07-31 (×2): 0.2 mg via INTRAVENOUS
  Filled 2023-07-30: qty 1

## 2023-07-30 NOTE — Progress Notes (Signed)
6 Days Post-Op Procedure(s) (LRB): RIGHT HEART CATH (N/A) Subjective:  Hemodynamics stable with borderline BP 90's. V-paced  Tolerating tube feeds. Taking a little ice cream po.  Objective: Vital signs in last 24 hours: Temp:  [97.6 F (36.4 C)-97.8 F (36.6 C)] 97.8 F (36.6 C) (09/22 0311) Pulse Rate:  [78-81] 80 (09/22 0630) Cardiac Rhythm: Ventricular paced (09/22 0400) Resp:  [16-28] 18 (09/22 0630) BP: (69-118)/(18-92) 100/36 (09/22 0630) SpO2:  [88 %-99 %] 96 % (09/22 0630)  Hemodynamic parameters for last 24 hours: CVP:  [11 mmHg-47 mmHg] 21 mmHg  Intake/Output from previous day: 09/21 0701 - 09/22 0700 In: 989.9 [I.V.:219.9; NG/GT:770] Out: 0  Intake/Output this shift: No intake/output data recorded.  General appearance: slowed mentation Neurologic: intact Heart: regular rate and rhythm Lungs: clear to auscultation bilaterally Wound: chest incision ok with minimal eschar.  Lab Results: Recent Labs    07/29/23 0421 07/30/23 0443  WBC 8.9 7.0  HGB 8.8* 8.6*  HCT 28.6* 28.2*  PLT 267 235   BMET:  Recent Labs    07/29/23 0421 07/29/23 0845  NA 129* 132*  K 6.5* 6.0*  CL 98 99  CO2 22 22  GLUCOSE 101* 85  BUN 78* 81*  CREATININE 4.55* 4.62*  CALCIUM 10.1 9.9    PT/INR: No results for input(s): "LABPROT", "INR" in the last 72 hours. ABG    Component Value Date/Time   PHART 7.317 (L) 07/11/2023 1346   HCO3 25.5 08/04/2023 0959   TCO2 27 07/09/2023 0959   ACIDBASEDEF 1.0 07/30/2023 1517   O2SAT 78.2 07/30/2023 0443   CBG (last 3)  Recent Labs    07/29/23 2307 07/30/23 0310 07/30/23 0748  GLUCAP 109* 136* 131*    Assessment/Plan:  Did not tolerate iHD. Planning to transition to comfort care today.   LOS: 38 days    Alleen Borne 07/30/2023

## 2023-07-30 NOTE — Progress Notes (Signed)
Daily Progress Note   Patient Name: [name]       Date: 07/30/2023 DOB: 1971/05/07  Age: 52 y.o. MRN#: [id number] Attending Physician: Eugenio Hoes, MD Primary Care Physician: Donne Anon, MD Admit Date: 06/27/2023  Reason for Consultation/Follow-up: Establishing goals of care  Subjective: I have reviewed medical records including EPIC notes, MAR, and labs. Received report from primary RN - no acute concerns. TVP in place - complete heart block underneath pacing. Vasopressin was restarted early this morning. Patient did not tolerate iHD yesterday; he had symptomatic hypotension early in the session with episode of unresponsiveness and session was truncated.  2:00 PM Met patient and family at bedside for scheduled follow up meeting - aunt/[name], mother/[name], father/[name] along with three other family members present. Patient was lying in bed - he is drowsy but easily arousable, alert, oriented, and able to participate in conversation.  Emotional support provided to patient and family as he reflects on the outcome of iHD attempt yesterday. Patient expresses readiness for transition to full comfort measures today. I again reviewed what the transition to full comfort measures would entail inclusive of medications to control pain, dyspnea, agitation, nausea, and itching. We discussed stopping all unnecessary measures such as dialysis, coretrak, blood draws, needle sticks, oxygen, antibiotics, CBGs/insulin, cardiac monitoring, IVF, and frequent vital signs. Patient would like to continue current oxygen use but understands it would not be escalated - comfort medications would be utilized for any shortness of breath symptoms. Education provided that other non-pharmacological interventions would be  utilized for holistic support and comfort such as spiritual support if requested, repositioning, music therapy, offering comfort feeds, and/or therapeutic listening. All care would focus on how the patient is looking and feeling.   Reviewed concern that once TVP is removed, in context of complete heart block, it is likely patient's life expectancy could be minutes to at most hours. Patient has continued to express being close to his family is important to him. We discussed removing all other life prolonging interventions except TVP to allow time for other family/friends to visit and scheduling a time in the coming days to discontinue TVP - patient and family were agreeable to this. We discussed he may not be stable for transfer to hospice facility once TVP is discontinued - would anticipate hospital death - patient is ok  with remaining in house and not pursuing transfer to hospice facility immediately. If patient did stabilize would re-consider.  We did review that despite leaving TVP, patient could pass away before scheduled TVP removal. Encouraged family/friends to visit sooner than later. There is a family member traveling from out of the state patient is hopeful to see. Patient and family are agreeable to TVP removal Tuesday 9/24 at 2pm - family would like to be present.   Continued code status discussions - patient opts for DNR/DNI today in context of transition to full comfort measures.  Reviewed EOL visitation policy.   Allowed space and time for patient and family to discuss information.  All questions and concerns addressed. Encouraged to call with questions and/or concerns. PMT card previously provided.  Length of Stay: 38  Current Medications: Scheduled Meds:   acyclovir  200 mg Per Tube BID   aspirin  81 mg Per Tube Daily   atorvastatin  10 mg Per Tube QHS   azelastine  2 spray Each Nare BID   Chlorhexidine Gluconate Cloth  6 each Topical Daily   Chlorhexidine Gluconate Cloth  6 each  Topical Q0600   darbepoetin (ARANESP) injection - DIALYSIS  150 mcg Subcutaneous Q Tue-1800   droxidopa  100 mg Oral TID WC   escitalopram  10 mg Per Tube Daily   feeding supplement (NEPRO CARB STEADY)  237 mL Oral BID BM   folic acid  1 mg Per Tube Daily   Gerhardt's butt cream   Topical Daily   guaiFENesin  15 mL Per Tube BID   heparin flush  10 Units Intracatheter Once   heparin  5,000 Units Subcutaneous Q8H   influenza vac split trivalent PF  0.5 mL Intramuscular Once   lidocaine-EPINEPHrine  20 mL Other Once   midodrine  30 mg Per Tube TID   multivitamin  1 tablet Per Tube BID   mouth rinse  15 mL Mouth Rinse 4 times per day   pantoprazole (PROTONIX) IV  40 mg Intravenous Q12H   sodium chloride flush  3 mL Intravenous Q12H   sodium chloride flush  3 mL Intravenous Q12H   thiamine  100 mg Per Tube Daily    Continuous Infusions:  sodium chloride 10 mL/hr at 07/29/23 1800   sodium chloride     sodium chloride 10 mL/hr at 07/29/23 1800   albumin human Stopped (06/23/23 0040)   anticoagulant sodium citrate     epinephrine Stopped (07/25/23 2033)   feeding supplement (VITAL 1.5 CAL) 1,000 mL (07/30/23 0832)   vasopressin 0.03 Units/min (07/30/23 0308)    PRN Meds: sodium chloride, sodium chloride, sodium chloride, acetaminophen (TYLENOL) oral liquid 160 mg/5 mL, albumin human, alteplase, anticoagulant sodium citrate, bisacodyl **OR** bisacodyl, clonazepam, dextrose, docusate, heparin, heparin, heparin sodium (porcine), loperamide HCl, ondansetron (ZOFRAN) IV, oxyCODONE, phenol, polyethylene glycol, simethicone, sodium chloride flush, sodium chloride flush  Physical Exam Vitals and nursing note reviewed.  Constitutional:      General: He is not in acute distress.    Appearance: He is cachectic. He is ill-appearing.  Pulmonary:     Effort: No respiratory distress.  Skin:    General: Skin is warm and dry.  Neurological:     Mental Status: He is alert and oriented to person,  place, and time.     Motor: Weakness present.  Psychiatric:        Attention and Perception: Attention normal.        Behavior: Behavior is cooperative.  Cognition and Memory: Cognition and memory normal.             Vital Signs: BP (!) 106/38   Pulse 80   Temp 97.8 F (36.6 C) (Axillary)   Resp (!) 23   Ht 6\' 3"  (1.905 m)   Wt 75 kg   SpO2 95%   BMI 20.67 kg/m  SpO2: SpO2: 95 % O2 Device: O2 Device: Nasal Cannula O2 Flow Rate: O2 Flow Rate (L/min): 4 L/min (for patient comfort)  Intake/output summary:  Intake/Output Summary (Last 24 hours) at 07/30/2023 1338 Last data filed at 07/29/2023 1800 Gross per 24 hour  Intake 449.99 ml  Output 0 ml  Net 449.99 ml   LBM: Last BM Date : 07/30/23 Baseline Weight: Weight: 95 kg Most recent weight: Weight: 75 kg       Palliative Assessment/Data: PPS 30% with tube feeds - coretrak to be removed today      Patient Active Problem List   Diagnosis Date Noted   Atrial fibrillation (HCC) 07/27/2023   ESRD (end stage renal disease) on dialysis (HCC) 07/20/2023   Severe protein-calorie malnutrition (HCC) 07/20/2023   Pressure injury of skin 07/20/2023   Cardiogenic shock (HCC) 07/13/2023   Acute metabolic encephalopathy 07/25/2023   ESRD (end stage renal disease) (HCC) 07/16/2023   Acute respiratory failure with hypoxia and hypercapnia (HCC) 07/19/2023   Physical deconditioning 07/10/2023   Acute respiratory failure with hypoxia (HCC) 07/10/2023   Shock (HCC) 07/06/2023   RVF (right ventricular failure) (HCC) 07/06/2023   AKI (acute kidney injury) (HCC) 07/06/2023   Protein-calorie malnutrition, severe 07/03/2023   Malnutrition of moderate degree 06/28/2023   S/P MVR (mitral valve repair) 06/23/2023   S/P AVR (aortic valve replacement) 06/23/2023   S/P Maze operation for atrial fibrillation 06/23/2023   Renal disease 06/23/2023   Tricuspid valve insufficiency 06/23/2023   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 male  with past medical history of ESRD on HD 20 year s/p failed renal transplant in 1999 and 2015, moderate pulmonary HTN, severe MR, TR, and severe AS s/p repairs, anxiety, depression, bipolar disorder, CAD, afib s/p MAZE admitted on 07-06-23 with fatigue and DOE due to valvular disease.    Patient underwent MVR/AVR/TVR 2023-07-06 with post-op course complicated by distributive shock and vasospastic syndrome. He underwent temporary pacemaker placement on 8/27 for heart block and was on CRRT 8/16-8/25, then resumed on 8/26 for optimization of hemodynamics and reduction of pressor requirements. There is concern for AV fistula in the setting of high output HF.   PMT has been consulted to assist with goals of care conversation on 8/29. Initial request from TCTS was to defer initial visit to 9/2, however patient's pressor requirements then increased overnight and PMT was asked to see by AHF team on 8/30.  Assessment: Principal Problem:   S/P tricuspid valve repair Active Problems:   S/P MVR (mitral valve repair)   S/P AVR (aortic valve  replacement)   S/P Maze operation for atrial fibrillation   Renal disease   Tricuspid valve insufficiency   Malnutrition of moderate degree   Protein-calorie malnutrition, severe   Shock (HCC)   RVF (right ventricular failure) (HCC)   AKI (acute kidney injury) (HCC)   Physical deconditioning    Acute respiratory failure with hypoxia (HCC)   Acute metabolic encephalopathy   ESRD (end stage renal disease) (HCC)   Acute respiratory failure with hypoxia and hypercapnia (HCC)   Cardiogenic shock (HCC)   ESRD (end stage renal disease) on dialysis (HCC)   Severe protein-calorie malnutrition (HCC)   Pressure injury of skin   Atrial fibrillation (HCC)   Terminal care  Recommendations/Plan: Initiated full comfort measures Now DNR/DNI  No further dialysis Once TVP discontinued, patient may pass quickly and will likely not be stable for transfer to hospice facility - anticipate hospital death. If he stabilizes can reconsider There are family traveling from out of state hopeful to see patient. If he survives, plan for TVP discontinuation Tuesday 9/24 at 2p - please ensure family are present prior to removing Added orders for EOL symptom management and to reflect full comfort measures, as well as discontinued orders that were not focused on comfort Unrestricted visitation orders were placed per current [place] EOL visitation policy  Nursing to provide frequent assessments and administer PRN medications as clinically necessary to ensure EOL comfort PMT will continue to follow and support holistically  Symptom Management Dilaudid PRN pain/dyspnea/increased work of breathing/RR>25 Tylenol PRN pain/fever Biotin twice daily Benadryl PRN itching Robinul PRN secretions Haldol PRN agitation/delirium Ativan PRN anxiety/seizure/sleep/distress Zofran PRN nausea/vomiting Liquifilm Tears PRN dry eye Continue dulcolax, azelastine, robitussin, imodium, midodrine, chloraseptic mouth spray, simethicone as tolerated    Goals of Care and Additional Recommendations: Limitations on Scope of Treatment: Full Comfort Care  Code Status:    Code Status Orders  (From admission, onward)           Start     Ordered   06/15/2023 1544  Full code  Continuous       Question:  By:  Answer:  Other    06/16/2023 1543           Code Status History     Date Active Date Inactive Code Status Order ID Comments User Context   11/24/2022 1430 11/24/2022 2224 Full Code [id number]  Kathleene Hazel, MD Inpatient   03/15/2020 1059 03/16/2020 1718 Full Code [id number]  Bridget Hartshorn, DO ED   12/18/2013 1420 12/20/2013 1435 Full Code [id number]  Andrena Mews, DO Inpatient   01/07/2013 2248 01/08/2013 1330 Full Code [id number]  Gerhard Munch, MD ED   01/05/2013 2025 01/07/2013 2035 Full Code [id number]  Carolan Clines, MD ED       Prognosis:  < 2 weeks  Discharge Planning: Anticipated Hospital Death  Care plan was discussed with primary RN, patient, patient's family  Thank you for allowing the Palliative Medicine Team to assist in the care of this patient.   Total Time 65 minutes Prolonged Time Billed  yes       Greater than 50%  of this time was spent counseling and coordinating care related to the above assessment and plan.  Haskel Khan, NP  Please contact Palliative Medicine Team phone at [phone] for questions and concerns.   *Portions of this note are a verbal dictation therefore any spelling and/or grammatical errors are due to the "[employer]" system interpretation.

## 2023-07-30 NOTE — Progress Notes (Signed)
Plan is to transition to comfort care today. No further dialysis.  Will sign off.   [name]  MD  [facility] 07/30/2023, 3:21 PM

## 2023-07-30 NOTE — Progress Notes (Signed)
Patient ID: [name], male   DOB: [birth year], 52 y.o.   MRN: [id number]  TCTS  Made comfort care only today.   V-paced at 80.  BP 80's to 90's

## 2023-07-30 NOTE — Progress Notes (Signed)
07/30/2023 Plan to transition to comfort today, available if PMT needs any help with discussing this with family; otherwise available PRN.  Myrla Halsted MD PCCM

## 2023-07-31 DIAGNOSIS — Z9889 Other specified postprocedural states: Secondary | ICD-10-CM | POA: Diagnosis not present

## 2023-07-31 DIAGNOSIS — Z515 Encounter for palliative care: Secondary | ICD-10-CM | POA: Diagnosis not present

## 2023-07-31 DIAGNOSIS — J9601 Acute respiratory failure with hypoxia: Secondary | ICD-10-CM | POA: Diagnosis not present

## 2023-07-31 DIAGNOSIS — I340 Nonrheumatic mitral (valve) insufficiency: Secondary | ICD-10-CM | POA: Diagnosis not present

## 2023-07-31 MED ORDER — SCOPOLAMINE 1 MG/3DAYS TD PT72
1.0000 | MEDICATED_PATCH | TRANSDERMAL | Status: DC
  Administered 2023-07-31: 1.5 mg via TRANSDERMAL
  Filled 2023-07-31: qty 1

## 2023-08-08 NOTE — Death Summary Note (Signed)
DEATH SUMMARY   Patient Details  Name: [name] MRN: [id number] DOB: [birth year]  Admission/Discharge Information   Admit Date:  2023/07/11  Date of Death: Date of Death: 19-Aug-2023  Time of Death: Time of Death: August 25, 1531  Length of Stay: 08-24-2038  Referring Physician: Donne Anon, MD   Reason(s) for Hospitalization  Surgical management of severe mitral insufficiency and tricuspid insufficiency and atrial fibrillation  Diagnoses  Preliminary cause of death:  Secondary Diagnoses (including complications and co-morbidities):  Principal Problem:   S/P tricuspid valve repair Active Problems:   S/P MVR (mitral valve repair)   S/P AVR (aortic valve replacement)   S/P Maze operation for atrial fibrillation   Renal disease   Tricuspid valve insufficiency   Malnutrition of moderate degree   Protein-calorie malnutrition, severe   Shock (HCC)   RVF (right ventricular failure) (HCC)   AKI (acute kidney injury) (HCC)   Physical deconditioning   Acute metabolic encephalopathy   Acute respiratory failure with hypoxia and hypercapnia (HCC)   Cardiogenic shock (HCC)   ESRD (end stage renal disease) on dialysis (HCC)   Severe protein-calorie malnutrition (HCC)   Pressure injury of skin   Atrial fibrillation (HCC) Pulmonary hypertension Complete heart block  Brief Hospital Course (including significant findings, care, treatment, and services provided and events leading to death)   History of Present Illness:     Pt is a very pleasant 52 yo wm who has been followed by Dr Scharlene Gloss for severe MR and TR. Pt has had increasing fatigue and when pushing himself having DOE. He is chronic hemodialysis for over 20 years and has not been having issues tolerating dialysis. He however with echo and TEE last year to have severe MR from what appears to be A1-2 flail. 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 He has mild AS with a mean gradient at cath and echo of only . He does have  moderate PHTN with SPA pressures of . He was felt that the best course for his multivalvular issues was surgery and pt has been asked to discuss this with cardiac surgery. He has varying Hgb levels reportedly without GI concerns but recently had some hematuria that he will be discussing with his urologist from [facility]  Dr. Leafy Ro reviewed the patient's diagnostic studies and determined he would benefit from surgical intervention. He reviewed the patient's treatment options and the risks and benefits of surgery. Mr. Neitzke was agreeable to proceed with surgery.   Hospital Course:  Mr. Overgaard arrived at [facility] and underwent tricuspid valve repair utilizing a 30mm MC3 ring, mitral valve repair utilizing a 30mm Simulus Band, aortic valve replacement utilizing a 27mm Inspiris valve, and Maze procedure. He tolerated the procedure well and was transferred to the SICU in stable condition.  The patient was weaned to extubated on 8/17.  He required support with Vasopressin, Epinephrine, Levophed all of which were weaned as hemodynamics allowed.  The patient has ESRD on dialysis, Nephrology was consulted and being patient was requiring pressor support he would require CRRT prior to initiation of dialysis.  The critical care team placed a dialysis catheter and this was initiated on 06/23/2023.  Post operative EKG showed complete heart block.  EP consult was obtained and they recommended close monitoring of patient's rhythm.  He required treatment with albumin and packed cells due to hypotension and post operative blood loss anemia.  He was hypokalemic and supplemented accordingly.  The patient's H/H remained low with  By: Carey Bullocks M.D.   On: 07/09/2023 13:28   DG Abd 1 View  Result Date: 07/06/2023 CLINICAL DATA:  Enteric catheter placement EXAM: ABDOMEN - 1 VIEW COMPARISON:  06/30/2023 FINDINGS: Frontal view of the lower chest and upper abdomen demonstrates enteric  catheter passing below diaphragm, tip projecting over the gastric antrum. Postsurgical changes are seen from aortic and mitral valve replacements. Single lead cardiac pacer tip overlies the right ventricle. Epicardial pacing wires are seen. Stable nonspecific gaseous distention of the transverse colon. No evidence of small-bowel obstruction. Cardiac silhouette is enlarged, with continued patchy airspace disease compatible with edema. IMPRESSION: 1. Enteric catheter tip projecting over the gastric antrum. Electronically Signed   By: Sharlet Salina M.D.   On: 07/06/2023 14:47   ECHOCARDIOGRAM LIMITED  Result Date: 07/06/2023    ECHOCARDIOGRAM LIMITED REPORT   Patient Name:   [name] Date of Exam: 07/06/2023 Medical Rec #:  [id number]  Height:       75.0 in Accession #:    [id number] Weight:       181.2 lb Date of Birth:  Sep 15, 1971  BSA:          2.104 m Patient Age:    52 years   BP:           104/38 mmHg Patient Gender: M          HR:           82 bpm. Exam Location:  Inpatient Procedure: Limited Echo, Color Doppler and Cardiac Doppler Indications:    I31.3 Pericardial effusion (noninflammatory)  History:        Patient has prior history of Echocardiogram examinations, most                 recent 06/29/2023. Arrythmias:Atrial Flutter; Risk                 Factors:Hypertension. Cardiac surgery on 06/26/2023 with 30mm TV                 Ring, 30mm MV Band, 27mm AVR Inspiris Resilia.  Sonographer:    Irving Burton Senior RDCS Referring Phys: [phone] [facility]  Sonographer Comments: Limited to recheck pericardial effusion IMPRESSIONS  1. Moderate pericardial effusion. The pericardial effusion is lateral to the left ventricle and surrounding the apex.  2. Left ventricular ejection fraction, by estimation, is 65 to 70%. The left ventricle has normal function.  3. Right ventricular systolic function is moderately reduced. The right ventricular size is moderately enlarged.  4. The mitral valve has been repaired/replaced.   5. The tricuspid valve is has been repaired/replaced.  6. The aortic valve has been repaired/replaced.  7. The inferior vena cava - dilated IVC, no inspiratory collapse, RA pressure 20 mmHg. Comparison(s): Pericardial effusion may be slightly less than prior exam. Valves not evaluated. FINDINGS  Left Ventricle: Left ventricular ejection fraction, by estimation, is 65 to 70%. The left ventricle has normal function. Right Ventricle: The right ventricular size is moderately enlarged. Right ventricular systolic function is moderately reduced. Pericardium: A moderately sized pericardial effusion is present. The pericardial effusion is lateral to the left ventricle and surrounding the apex. Mitral Valve: The mitral valve has been repaired/replaced. Tricuspid Valve: The tricuspid valve is has been repaired/replaced. Aortic Valve: The aortic valve has been repaired/replaced. Venous: The inferior vena cava - dilated IVC, no inspiratory collapse, RA pressure 20 mmHg. Additional Comments: Spectral Doppler performed. Color Doppler performed.  Weston Brass MD Electronically signed by Lynda Rainwater  Jacques Navy MD Signature Date/Time: 07/06/2023/1:24:33 PM    Final    DG CHEST PORT 1 VIEW  Result Date: 06/20/2023 CLINICAL DATA:  Central line placement EXAM: PORTABLE CHEST 1 VIEW COMPARISON:  07/06/2023 at 0600 hours FINDINGS: Cardiomegaly with mild interstitial edema. No definite pleural effusions. No pneumothorax. New right IJ transvenous pacemaker lead, in satisfactory position. Left IJ dual lumen dialysis catheter terminating in the mid SVC. Right subclavian venous catheter terminating in the mid SVC. Enteric tube coursing into the stomach. Prosthetic valves.  Median sternotomy. IMPRESSION: New right IJ transvenous pacemaker lead, in satisfactory position. No pneumothorax. Cardiomegaly with mild interstitial edema. Electronically Signed   By: Charline Bills M.D.   On: 06/14/2023 16:22   ECHOCARDIOGRAM COMPLETE  Result Date:  06/10/2023    ECHOCARDIOGRAM REPORT   Patient Name:   [name] Date of Exam: 07/03/2023 Medical Rec #:  [id number]  Height:       75.0 in Accession #:    [id number] Weight:       186.1 lb Date of Birth:  Mar 21, 1971  BSA:          2.128 m Patient Age:    52 years   BP:           0/0 mmHg Patient Gender: M          HR:           47 bpm.47 Exam Location:  Inpatient Procedure: 2D Echo Indications:    Prior Valve disease, unclear blood pressure  History:        Patient has prior history of Echocardiogram examinations. Aortic                 Valve Disease and Mitral Valve Disease.  Sonographer:    Data processing manager Referring Phys: [phone] DALTON S MCLEAN IMPRESSIONS  1. Left ventricular ejection fraction, by estimation, is 70 to 75%. The left ventricle has hyperdynamic function. The left ventricle has no regional wall motion abnormalities. There is severe concentric left ventricular hypertrophy. Left ventricular diastolic parameters are indeterminate.  2. Right ventricular systolic function is moderately reduced. The right ventricular size is severely enlarged. Mildly increased right ventricular wall thickness.  3. Left atrial size was moderately dilated.  4. Right atrial size was moderately dilated.  5. Large pericardial effusion. The pericardial effusion is lateral to the left ventricle and circumferential. There is no evidence of cardiac tamponade.  6. 2D MVA 3.16 cm2. 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 The mitral valve has been repaired/replaced. No evidence of mitral valve regurgitation.  7. Mean gradient 4.4 mm Hg at a heart rate of 54 bpm. The tricuspid valve is has been repaired/replaced.  8. The aortic valve has been repaired/replaced. Aortic valve regurgitation is not visualized. Aortic valve mean gradient measures 18.0 mmHg. Aortic valve acceleration time measures 86 msec. Comparison(s): Prior images reviewed side by side. Mitral mean gradients have improved at lower heart rates. Pericardial effusion  size has increased. Aortic flows not well seen on prior study. FINDINGS  Left Ventricle: Left ventricular ejection fraction, by estimation, is 70 to 75%. The left ventricle has hyperdynamic function. The left ventricle has no regional wall motion abnormalities. The left ventricular internal cavity size was normal in size. There is severe concentric left ventricular hypertrophy. Left ventricular diastolic parameters are indeterminate. Right Ventricle: The right ventricular size is severely enlarged. Mildly increased right ventricular wall thickness. Right ventricular systolic function is moderately reduced. Left Atrium: Left atrial size was moderately dilated. Right  Maisie Fus, MD;  Location: [facility] ENDOSCOPY;  Service: Cardiovascular;  Laterality: N/A;  TEE WITHOUT CARDIOVERSION N/A 06/10/2023  Procedure: TRANSESOPHAGEAL ECHOCARDIOGRAM;  Surgeon: Eugenio Hoes, MD;  Location: [facility] OR;  Service: Open Heart Surgery;  Laterality: N/A;  TEMPORARY PACEMAKER N/A 06/30/2023  Procedure: TEMPORARY PACEMAKER;  Surgeon: Orbie Pyo, MD;  Location: [facility] INVASIVE CV LAB;  Service: Cardiovascular;  Laterality: N/A;  TRICUSPID VALVE REPLACEMENT N/A 06/20/2023  Procedure: TRICUSPID VALVE REPAIR MC3 Ring size 30;  Surgeon: Eugenio Hoes, MD;  Location: [facility] OR;  Service: Open Heart Surgery;  Laterality: N/A; Claudine Mouton 07/27/2023, 2:09 PM  ECHOCARDIOGRAM COMPLETE  Result Date: 07/17/2023    ECHOCARDIOGRAM REPORT   Patient Name:   TREMIR SIEGMANN Date of Exam:  08/04/2023 Medical Rec #:  [id number]  Height:       75.0 in Accession #:    [id number] Weight:       163.1 lb Date of Birth:  December 19, 1970  BSA:          2.012 m Patient Age:    52 years   BP:           92/39 mmHg Patient Gender: M          HR:           82 bpm. Exam Location:  Inpatient Procedure: 2D Echo, Cardiac Doppler and Color Doppler Indications:    s/p MVR  History:        Patient has prior history of Echocardiogram examinations, most                 recent 07/17/2023. CHF and Cardiomyopathy, CAD, Pulmonary HTN, 30                 mm TV ring; Risk Factors:Sleep Apnea and Hypertension. ESRD on                 HD.                 Aortic Valve: 27 mm bioprosthetic valve is present in the aortic                 position.                 Mitral Valve: 30 mm MV Band valve is present in the mitral                 position.  Sonographer:    Milda Smart Referring Phys: [id number] Eugenio Hoes  Sonographer Comments: Image acquisition challenging due to patient body habitus and Image acquisition challenging due to respiratory motion. IMPRESSIONS  1. Left ventricular ejection fraction, by estimation, is 60 to 65%. The left ventricle has normal function. The left ventricle has no regional wall motion abnormalities. There is moderate concentric left ventricular hypertrophy. Left ventricular diastolic parameters are indeterminate. There is the interventricular septum is flattened in systole and diastole, consistent with right ventricular pressure and volume overload.  2. Right ventricular systolic function is severely reduced. The right ventricular size is severely enlarged. There is severely elevated pulmonary artery systolic pressure. The estimated right ventricular systolic pressure is 46.6 mmHg.  3. Left atrial size was severely dilated.  4. Right atrial size was moderately dilated.  5. The mitral valve has been repaired/replaced. No evidence of mitral valve regurgitation. Moderate mitral stenosis. The mean mitral valve  gradient is 8.0 mmHg. There is a 30 mm MV Band present in the mitral position.  6. Moderate pericardial effusion. The  By: Carey Bullocks M.D.   On: 07/09/2023 13:28   DG Abd 1 View  Result Date: 07/06/2023 CLINICAL DATA:  Enteric catheter placement EXAM: ABDOMEN - 1 VIEW COMPARISON:  06/30/2023 FINDINGS: Frontal view of the lower chest and upper abdomen demonstrates enteric  catheter passing below diaphragm, tip projecting over the gastric antrum. Postsurgical changes are seen from aortic and mitral valve replacements. Single lead cardiac pacer tip overlies the right ventricle. Epicardial pacing wires are seen. Stable nonspecific gaseous distention of the transverse colon. No evidence of small-bowel obstruction. Cardiac silhouette is enlarged, with continued patchy airspace disease compatible with edema. IMPRESSION: 1. Enteric catheter tip projecting over the gastric antrum. Electronically Signed   By: Sharlet Salina M.D.   On: 07/06/2023 14:47   ECHOCARDIOGRAM LIMITED  Result Date: 07/06/2023    ECHOCARDIOGRAM LIMITED REPORT   Patient Name:   [name] Date of Exam: 07/06/2023 Medical Rec #:  [id number]  Height:       75.0 in Accession #:    [id number] Weight:       181.2 lb Date of Birth:  Sep 15, 1971  BSA:          2.104 m Patient Age:    52 years   BP:           104/38 mmHg Patient Gender: M          HR:           82 bpm. Exam Location:  Inpatient Procedure: Limited Echo, Color Doppler and Cardiac Doppler Indications:    I31.3 Pericardial effusion (noninflammatory)  History:        Patient has prior history of Echocardiogram examinations, most                 recent 06/29/2023. Arrythmias:Atrial Flutter; Risk                 Factors:Hypertension. Cardiac surgery on 06/26/2023 with 30mm TV                 Ring, 30mm MV Band, 27mm AVR Inspiris Resilia.  Sonographer:    Irving Burton Senior RDCS Referring Phys: [phone] [facility]  Sonographer Comments: Limited to recheck pericardial effusion IMPRESSIONS  1. Moderate pericardial effusion. The pericardial effusion is lateral to the left ventricle and surrounding the apex.  2. Left ventricular ejection fraction, by estimation, is 65 to 70%. The left ventricle has normal function.  3. Right ventricular systolic function is moderately reduced. The right ventricular size is moderately enlarged.  4. The mitral valve has been repaired/replaced.   5. The tricuspid valve is has been repaired/replaced.  6. The aortic valve has been repaired/replaced.  7. The inferior vena cava - dilated IVC, no inspiratory collapse, RA pressure 20 mmHg. Comparison(s): Pericardial effusion may be slightly less than prior exam. Valves not evaluated. FINDINGS  Left Ventricle: Left ventricular ejection fraction, by estimation, is 65 to 70%. The left ventricle has normal function. Right Ventricle: The right ventricular size is moderately enlarged. Right ventricular systolic function is moderately reduced. Pericardium: A moderately sized pericardial effusion is present. The pericardial effusion is lateral to the left ventricle and surrounding the apex. Mitral Valve: The mitral valve has been repaired/replaced. Tricuspid Valve: The tricuspid valve is has been repaired/replaced. Aortic Valve: The aortic valve has been repaired/replaced. Venous: The inferior vena cava - dilated IVC, no inspiratory collapse, RA pressure 20 mmHg. Additional Comments: Spectral Doppler performed. Color Doppler performed.  Weston Brass MD Electronically signed by Lynda Rainwater  Jacques Navy MD Signature Date/Time: 07/06/2023/1:24:33 PM    Final    DG CHEST PORT 1 VIEW  Result Date: 06/20/2023 CLINICAL DATA:  Central line placement EXAM: PORTABLE CHEST 1 VIEW COMPARISON:  07/06/2023 at 0600 hours FINDINGS: Cardiomegaly with mild interstitial edema. No definite pleural effusions. No pneumothorax. New right IJ transvenous pacemaker lead, in satisfactory position. Left IJ dual lumen dialysis catheter terminating in the mid SVC. Right subclavian venous catheter terminating in the mid SVC. Enteric tube coursing into the stomach. Prosthetic valves.  Median sternotomy. IMPRESSION: New right IJ transvenous pacemaker lead, in satisfactory position. No pneumothorax. Cardiomegaly with mild interstitial edema. Electronically Signed   By: Charline Bills M.D.   On: 06/14/2023 16:22   ECHOCARDIOGRAM COMPLETE  Result Date:  06/10/2023    ECHOCARDIOGRAM REPORT   Patient Name:   [name] Date of Exam: 07/03/2023 Medical Rec #:  [id number]  Height:       75.0 in Accession #:    [id number] Weight:       186.1 lb Date of Birth:  Mar 21, 1971  BSA:          2.128 m Patient Age:    52 years   BP:           0/0 mmHg Patient Gender: M          HR:           47 bpm.47 Exam Location:  Inpatient Procedure: 2D Echo Indications:    Prior Valve disease, unclear blood pressure  History:        Patient has prior history of Echocardiogram examinations. Aortic                 Valve Disease and Mitral Valve Disease.  Sonographer:    Data processing manager Referring Phys: [phone] DALTON S MCLEAN IMPRESSIONS  1. Left ventricular ejection fraction, by estimation, is 70 to 75%. The left ventricle has hyperdynamic function. The left ventricle has no regional wall motion abnormalities. There is severe concentric left ventricular hypertrophy. Left ventricular diastolic parameters are indeterminate.  2. Right ventricular systolic function is moderately reduced. The right ventricular size is severely enlarged. Mildly increased right ventricular wall thickness.  3. Left atrial size was moderately dilated.  4. Right atrial size was moderately dilated.  5. Large pericardial effusion. The pericardial effusion is lateral to the left ventricle and circumferential. There is no evidence of cardiac tamponade.  6. 2D MVA 3.16 cm2. 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 The mitral valve has been repaired/replaced. No evidence of mitral valve regurgitation.  7. Mean gradient 4.4 mm Hg at a heart rate of 54 bpm. The tricuspid valve is has been repaired/replaced.  8. The aortic valve has been repaired/replaced. Aortic valve regurgitation is not visualized. Aortic valve mean gradient measures 18.0 mmHg. Aortic valve acceleration time measures 86 msec. Comparison(s): Prior images reviewed side by side. Mitral mean gradients have improved at lower heart rates. Pericardial effusion  size has increased. Aortic flows not well seen on prior study. FINDINGS  Left Ventricle: Left ventricular ejection fraction, by estimation, is 70 to 75%. The left ventricle has hyperdynamic function. The left ventricle has no regional wall motion abnormalities. The left ventricular internal cavity size was normal in size. There is severe concentric left ventricular hypertrophy. Left ventricular diastolic parameters are indeterminate. Right Ventricle: The right ventricular size is severely enlarged. Mildly increased right ventricular wall thickness. Right ventricular systolic function is moderately reduced. Left Atrium: Left atrial size was moderately dilated. Right  By: Carey Bullocks M.D.   On: 07/09/2023 13:28   DG Abd 1 View  Result Date: 07/06/2023 CLINICAL DATA:  Enteric catheter placement EXAM: ABDOMEN - 1 VIEW COMPARISON:  06/30/2023 FINDINGS: Frontal view of the lower chest and upper abdomen demonstrates enteric  catheter passing below diaphragm, tip projecting over the gastric antrum. Postsurgical changes are seen from aortic and mitral valve replacements. Single lead cardiac pacer tip overlies the right ventricle. Epicardial pacing wires are seen. Stable nonspecific gaseous distention of the transverse colon. No evidence of small-bowel obstruction. Cardiac silhouette is enlarged, with continued patchy airspace disease compatible with edema. IMPRESSION: 1. Enteric catheter tip projecting over the gastric antrum. Electronically Signed   By: Sharlet Salina M.D.   On: 07/06/2023 14:47   ECHOCARDIOGRAM LIMITED  Result Date: 07/06/2023    ECHOCARDIOGRAM LIMITED REPORT   Patient Name:   [name] Date of Exam: 07/06/2023 Medical Rec #:  [id number]  Height:       75.0 in Accession #:    [id number] Weight:       181.2 lb Date of Birth:  Sep 15, 1971  BSA:          2.104 m Patient Age:    52 years   BP:           104/38 mmHg Patient Gender: M          HR:           82 bpm. Exam Location:  Inpatient Procedure: Limited Echo, Color Doppler and Cardiac Doppler Indications:    I31.3 Pericardial effusion (noninflammatory)  History:        Patient has prior history of Echocardiogram examinations, most                 recent 06/29/2023. Arrythmias:Atrial Flutter; Risk                 Factors:Hypertension. Cardiac surgery on 06/26/2023 with 30mm TV                 Ring, 30mm MV Band, 27mm AVR Inspiris Resilia.  Sonographer:    Irving Burton Senior RDCS Referring Phys: [phone] [facility]  Sonographer Comments: Limited to recheck pericardial effusion IMPRESSIONS  1. Moderate pericardial effusion. The pericardial effusion is lateral to the left ventricle and surrounding the apex.  2. Left ventricular ejection fraction, by estimation, is 65 to 70%. The left ventricle has normal function.  3. Right ventricular systolic function is moderately reduced. The right ventricular size is moderately enlarged.  4. The mitral valve has been repaired/replaced.   5. The tricuspid valve is has been repaired/replaced.  6. The aortic valve has been repaired/replaced.  7. The inferior vena cava - dilated IVC, no inspiratory collapse, RA pressure 20 mmHg. Comparison(s): Pericardial effusion may be slightly less than prior exam. Valves not evaluated. FINDINGS  Left Ventricle: Left ventricular ejection fraction, by estimation, is 65 to 70%. The left ventricle has normal function. Right Ventricle: The right ventricular size is moderately enlarged. Right ventricular systolic function is moderately reduced. Pericardium: A moderately sized pericardial effusion is present. The pericardial effusion is lateral to the left ventricle and surrounding the apex. Mitral Valve: The mitral valve has been repaired/replaced. Tricuspid Valve: The tricuspid valve is has been repaired/replaced. Aortic Valve: The aortic valve has been repaired/replaced. Venous: The inferior vena cava - dilated IVC, no inspiratory collapse, RA pressure 20 mmHg. Additional Comments: Spectral Doppler performed. Color Doppler performed.  Weston Brass MD Electronically signed by Lynda Rainwater  Maisie Fus, MD;  Location: [facility] ENDOSCOPY;  Service: Cardiovascular;  Laterality: N/A;  TEE WITHOUT CARDIOVERSION N/A 06/10/2023  Procedure: TRANSESOPHAGEAL ECHOCARDIOGRAM;  Surgeon: Eugenio Hoes, MD;  Location: [facility] OR;  Service: Open Heart Surgery;  Laterality: N/A;  TEMPORARY PACEMAKER N/A 06/30/2023  Procedure: TEMPORARY PACEMAKER;  Surgeon: Orbie Pyo, MD;  Location: [facility];  Service: Cardiovascular;  Laterality: N/A;  TRICUSPID VALVE REPLACEMENT N/A 06/20/2023  Procedure: TRICUSPID VALVE REPAIR MC3 Ring size 30;  Surgeon: Eugenio Hoes, MD;  Location: [facility] OR;  Service: Open Heart Surgery;  Laterality: N/A; Claudine Mouton 07/27/2023, 2:09 PM  ECHOCARDIOGRAM COMPLETE  Result Date: 07/17/2023    ECHOCARDIOGRAM REPORT   Patient Name:   TREMIR SIEGMANN Date of Exam:  08/04/2023 Medical Rec #:  [id number]  Height:       75.0 in Accession #:    [id number] Weight:       163.1 lb Date of Birth:  December 19, 1970  BSA:          2.012 m Patient Age:    52 years   BP:           92/39 mmHg Patient Gender: M          HR:           82 bpm. Exam Location:  Inpatient Procedure: 2D Echo, Cardiac Doppler and Color Doppler Indications:    s/p MVR  History:        Patient has prior history of Echocardiogram examinations, most                 recent 07/17/2023. CHF and Cardiomyopathy, CAD, Pulmonary HTN, 30                 mm TV ring; Risk Factors:Sleep Apnea and Hypertension. ESRD on                 HD.                 Aortic Valve: 27 mm bioprosthetic valve is present in the aortic                 position.                 Mitral Valve: 30 mm MV Band valve is present in the mitral                 position.  Sonographer:    Milda Smart Referring Phys: [id number] Eugenio Hoes  Sonographer Comments: Image acquisition challenging due to patient body habitus and Image acquisition challenging due to respiratory motion. IMPRESSIONS  1. Left ventricular ejection fraction, by estimation, is 60 to 65%. The left ventricle has normal function. The left ventricle has no regional wall motion abnormalities. There is moderate concentric left ventricular hypertrophy. Left ventricular diastolic parameters are indeterminate. There is the interventricular septum is flattened in systole and diastole, consistent with right ventricular pressure and volume overload.  2. Right ventricular systolic function is severely reduced. The right ventricular size is severely enlarged. There is severely elevated pulmonary artery systolic pressure. The estimated right ventricular systolic pressure is 46.6 mmHg.  3. Left atrial size was severely dilated.  4. Right atrial size was moderately dilated.  5. The mitral valve has been repaired/replaced. No evidence of mitral valve regurgitation. Moderate mitral stenosis. The mean mitral valve  gradient is 8.0 mmHg. There is a 30 mm MV Band present in the mitral position.  6. Moderate pericardial effusion. The  Jacques Navy MD Signature Date/Time: 07/06/2023/1:24:33 PM    Final    DG CHEST PORT 1 VIEW  Result Date: 06/20/2023 CLINICAL DATA:  Central line placement EXAM: PORTABLE CHEST 1 VIEW COMPARISON:  07/06/2023 at 0600 hours FINDINGS: Cardiomegaly with mild interstitial edema. No definite pleural effusions. No pneumothorax. New right IJ transvenous pacemaker lead, in satisfactory position. Left IJ dual lumen dialysis catheter terminating in the mid SVC. Right subclavian venous catheter terminating in the mid SVC. Enteric tube coursing into the stomach. Prosthetic valves.  Median sternotomy. IMPRESSION: New right IJ transvenous pacemaker lead, in satisfactory position. No pneumothorax. Cardiomegaly with mild interstitial edema. Electronically Signed   By: Charline Bills M.D.   On: 06/14/2023 16:22   ECHOCARDIOGRAM COMPLETE  Result Date:  06/10/2023    ECHOCARDIOGRAM REPORT   Patient Name:   [name] Date of Exam: 07/03/2023 Medical Rec #:  [id number]  Height:       75.0 in Accession #:    [id number] Weight:       186.1 lb Date of Birth:  Mar 21, 1971  BSA:          2.128 m Patient Age:    52 years   BP:           0/0 mmHg Patient Gender: M          HR:           47 bpm.47 Exam Location:  Inpatient Procedure: 2D Echo Indications:    Prior Valve disease, unclear blood pressure  History:        Patient has prior history of Echocardiogram examinations. Aortic                 Valve Disease and Mitral Valve Disease.  Sonographer:    Data processing manager Referring Phys: [phone] [facility] S [facility] IMPRESSIONS  1. Left ventricular ejection fraction, by estimation, is 70 to 75%. The left ventricle has hyperdynamic function. The left ventricle has no regional wall motion abnormalities. There is severe concentric left ventricular hypertrophy. Left ventricular diastolic parameters are indeterminate.  2. Right ventricular systolic function is moderately reduced. The right ventricular size is severely enlarged. Mildly increased right ventricular wall thickness.  3. Left atrial size was moderately dilated.  4. Right atrial size was moderately dilated.  5. Large pericardial effusion. The pericardial effusion is lateral to the left ventricle and circumferential. There is no evidence of cardiac tamponade.  6. 2D MVA 3.16 cm2. 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 The mitral valve has been repaired/replaced. No evidence of mitral valve regurgitation.  7. Mean gradient 4.4 mm Hg at a heart rate of 54 bpm. The tricuspid valve is has been repaired/replaced.  8. The aortic valve has been repaired/replaced. Aortic valve regurgitation is not visualized. Aortic valve mean gradient measures 18.0 mmHg. Aortic valve acceleration time measures 86 msec. Comparison(s): Prior images reviewed side by side. Mitral mean gradients have improved at lower heart rates. Pericardial effusion  size has increased. Aortic flows not well seen on prior study. FINDINGS  Left Ventricle: Left ventricular ejection fraction, by estimation, is 70 to 75%. The left ventricle has hyperdynamic function. The left ventricle has no regional wall motion abnormalities. The left ventricular internal cavity size was normal in size. There is severe concentric left ventricular hypertrophy. Left ventricular diastolic parameters are indeterminate. Right Ventricle: The right ventricular size is severely enlarged. Mildly increased right ventricular wall thickness. Right ventricular systolic function is moderately reduced. Left Atrium: Left atrial size was moderately dilated. Right  By: Carey Bullocks M.D.   On: 07/09/2023 13:28   DG Abd 1 View  Result Date: 07/06/2023 CLINICAL DATA:  Enteric catheter placement EXAM: ABDOMEN - 1 VIEW COMPARISON:  06/30/2023 FINDINGS: Frontal view of the lower chest and upper abdomen demonstrates enteric  catheter passing below diaphragm, tip projecting over the gastric antrum. Postsurgical changes are seen from aortic and mitral valve replacements. Single lead cardiac pacer tip overlies the right ventricle. Epicardial pacing wires are seen. Stable nonspecific gaseous distention of the transverse colon. No evidence of small-bowel obstruction. Cardiac silhouette is enlarged, with continued patchy airspace disease compatible with edema. IMPRESSION: 1. Enteric catheter tip projecting over the gastric antrum. Electronically Signed   By: Sharlet Salina M.D.   On: 07/06/2023 14:47   ECHOCARDIOGRAM LIMITED  Result Date: 07/06/2023    ECHOCARDIOGRAM LIMITED REPORT   Patient Name:   [name] Date of Exam: 07/06/2023 Medical Rec #:  [id number]  Height:       75.0 in Accession #:    [id number] Weight:       181.2 lb Date of Birth:  Sep 15, 1971  BSA:          2.104 m Patient Age:    52 years   BP:           104/38 mmHg Patient Gender: M          HR:           82 bpm. Exam Location:  Inpatient Procedure: Limited Echo, Color Doppler and Cardiac Doppler Indications:    I31.3 Pericardial effusion (noninflammatory)  History:        Patient has prior history of Echocardiogram examinations, most                 recent 06/29/2023. Arrythmias:Atrial Flutter; Risk                 Factors:Hypertension. Cardiac surgery on 06/26/2023 with 30mm TV                 Ring, 30mm MV Band, 27mm AVR Inspiris Resilia.  Sonographer:    Irving Burton Senior RDCS Referring Phys: [phone] [facility]  Sonographer Comments: Limited to recheck pericardial effusion IMPRESSIONS  1. Moderate pericardial effusion. The pericardial effusion is lateral to the left ventricle and surrounding the apex.  2. Left ventricular ejection fraction, by estimation, is 65 to 70%. The left ventricle has normal function.  3. Right ventricular systolic function is moderately reduced. The right ventricular size is moderately enlarged.  4. The mitral valve has been repaired/replaced.   5. The tricuspid valve is has been repaired/replaced.  6. The aortic valve has been repaired/replaced.  7. The inferior vena cava - dilated IVC, no inspiratory collapse, RA pressure 20 mmHg. Comparison(s): Pericardial effusion may be slightly less than prior exam. Valves not evaluated. FINDINGS  Left Ventricle: Left ventricular ejection fraction, by estimation, is 65 to 70%. The left ventricle has normal function. Right Ventricle: The right ventricular size is moderately enlarged. Right ventricular systolic function is moderately reduced. Pericardium: A moderately sized pericardial effusion is present. The pericardial effusion is lateral to the left ventricle and surrounding the apex. Mitral Valve: The mitral valve has been repaired/replaced. Tricuspid Valve: The tricuspid valve is has been repaired/replaced. Aortic Valve: The aortic valve has been repaired/replaced. Venous: The inferior vena cava - dilated IVC, no inspiratory collapse, RA pressure 20 mmHg. Additional Comments: Spectral Doppler performed. Color Doppler performed.  Weston Brass MD Electronically signed by Lynda Rainwater  DEATH SUMMARY   Patient Details  Name: [name] MRN: [id number] DOB: [birth year]  Admission/Discharge Information   Admit Date:  2023/07/11  Date of Death: Date of Death: 19-Aug-2023  Time of Death: Time of Death: August 25, 1531  Length of Stay: 08-24-2038  Referring Physician: Donne Anon, MD   Reason(s) for Hospitalization  Surgical management of severe mitral insufficiency and tricuspid insufficiency and atrial fibrillation  Diagnoses  Preliminary cause of death:  Secondary Diagnoses (including complications and co-morbidities):  Principal Problem:   S/P tricuspid valve repair Active Problems:   S/P MVR (mitral valve repair)   S/P AVR (aortic valve replacement)   S/P Maze operation for atrial fibrillation   Renal disease   Tricuspid valve insufficiency   Malnutrition of moderate degree   Protein-calorie malnutrition, severe   Shock (HCC)   RVF (right ventricular failure) (HCC)   AKI (acute kidney injury) (HCC)   Physical deconditioning   Acute metabolic encephalopathy   Acute respiratory failure with hypoxia and hypercapnia (HCC)   Cardiogenic shock (HCC)   ESRD (end stage renal disease) on dialysis (HCC)   Severe protein-calorie malnutrition (HCC)   Pressure injury of skin   Atrial fibrillation (HCC) Pulmonary hypertension Complete heart block  Brief Hospital Course (including significant findings, care, treatment, and services provided and events leading to death)   History of Present Illness:     Pt is a very pleasant 52 yo wm who has been followed by Dr Scharlene Gloss for severe MR and TR. Pt has had increasing fatigue and when pushing himself having DOE. He is chronic hemodialysis for over 20 years and has not been having issues tolerating dialysis. He however with echo and TEE last year to have severe MR from what appears to be A1-2 flail. 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 He has mild AS with a mean gradient at cath and echo of only . He does have  moderate PHTN with SPA pressures of . He was felt that the best course for his multivalvular issues was surgery and pt has been asked to discuss this with cardiac surgery. He has varying Hgb levels reportedly without GI concerns but recently had some hematuria that he will be discussing with his urologist from [facility]  Dr. Leafy Ro reviewed the patient's diagnostic studies and determined he would benefit from surgical intervention. He reviewed the patient's treatment options and the risks and benefits of surgery. Mr. Neitzke was agreeable to proceed with surgery.   Hospital Course:  Mr. Overgaard arrived at [facility] and underwent tricuspid valve repair utilizing a 30mm MC3 ring, mitral valve repair utilizing a 30mm Simulus Band, aortic valve replacement utilizing a 27mm Inspiris valve, and Maze procedure. He tolerated the procedure well and was transferred to the SICU in stable condition.  The patient was weaned to extubated on 8/17.  He required support with Vasopressin, Epinephrine, Levophed all of which were weaned as hemodynamics allowed.  The patient has ESRD on dialysis, Nephrology was consulted and being patient was requiring pressor support he would require CRRT prior to initiation of dialysis.  The critical care team placed a dialysis catheter and this was initiated on 06/23/2023.  Post operative EKG showed complete heart block.  EP consult was obtained and they recommended close monitoring of patient's rhythm.  He required treatment with albumin and packed cells due to hypotension and post operative blood loss anemia.  He was hypokalemic and supplemented accordingly.  The patient's H/H remained low with  Maisie Fus, MD;  Location: [facility] ENDOSCOPY;  Service: Cardiovascular;  Laterality: N/A;  TEE WITHOUT CARDIOVERSION N/A 06/10/2023  Procedure: TRANSESOPHAGEAL ECHOCARDIOGRAM;  Surgeon: Eugenio Hoes, MD;  Location: [facility] OR;  Service: Open Heart Surgery;  Laterality: N/A;  TEMPORARY PACEMAKER N/A 06/30/2023  Procedure: TEMPORARY PACEMAKER;  Surgeon: Orbie Pyo, MD;  Location: [facility] INVASIVE CV LAB;  Service: Cardiovascular;  Laterality: N/A;  TRICUSPID VALVE REPLACEMENT N/A 06/20/2023  Procedure: TRICUSPID VALVE REPAIR MC3 Ring size 30;  Surgeon: Eugenio Hoes, MD;  Location: [facility] OR;  Service: Open Heart Surgery;  Laterality: N/A; Claudine Mouton 07/27/2023, 2:09 PM  ECHOCARDIOGRAM COMPLETE  Result Date: 07/17/2023    ECHOCARDIOGRAM REPORT   Patient Name:   TREMIR SIEGMANN Date of Exam:  08/04/2023 Medical Rec #:  [id number]  Height:       75.0 in Accession #:    [id number] Weight:       163.1 lb Date of Birth:  December 19, 1970  BSA:          2.012 m Patient Age:    52 years   BP:           92/39 mmHg Patient Gender: M          HR:           82 bpm. Exam Location:  Inpatient Procedure: 2D Echo, Cardiac Doppler and Color Doppler Indications:    s/p MVR  History:        Patient has prior history of Echocardiogram examinations, most                 recent 07/17/2023. CHF and Cardiomyopathy, CAD, Pulmonary HTN, 30                 mm TV ring; Risk Factors:Sleep Apnea and Hypertension. ESRD on                 HD.                 Aortic Valve: 27 mm bioprosthetic valve is present in the aortic                 position.                 Mitral Valve: 30 mm MV Band valve is present in the mitral                 position.  Sonographer:    Milda Smart Referring Phys: [id number] Eugenio Hoes  Sonographer Comments: Image acquisition challenging due to patient body habitus and Image acquisition challenging due to respiratory motion. IMPRESSIONS  1. Left ventricular ejection fraction, by estimation, is 60 to 65%. The left ventricle has normal function. The left ventricle has no regional wall motion abnormalities. There is moderate concentric left ventricular hypertrophy. Left ventricular diastolic parameters are indeterminate. There is the interventricular septum is flattened in systole and diastole, consistent with right ventricular pressure and volume overload.  2. Right ventricular systolic function is severely reduced. The right ventricular size is severely enlarged. There is severely elevated pulmonary artery systolic pressure. The estimated right ventricular systolic pressure is 46.6 mmHg.  3. Left atrial size was severely dilated.  4. Right atrial size was moderately dilated.  5. The mitral valve has been repaired/replaced. No evidence of mitral valve regurgitation. Moderate mitral stenosis. The mean mitral valve  gradient is 8.0 mmHg. There is a 30 mm MV Band present in the mitral position.  6. Moderate pericardial effusion. The  DEATH SUMMARY   Patient Details  Name: [name] MRN: [id number] DOB: [birth year]  Admission/Discharge Information   Admit Date:  2023/07/11  Date of Death: Date of Death: 19-Aug-2023  Time of Death: Time of Death: August 25, 1531  Length of Stay: 08-24-2038  Referring Physician: Donne Anon, MD   Reason(s) for Hospitalization  Surgical management of severe mitral insufficiency and tricuspid insufficiency and atrial fibrillation  Diagnoses  Preliminary cause of death:  Secondary Diagnoses (including complications and co-morbidities):  Principal Problem:   S/P tricuspid valve repair Active Problems:   S/P MVR (mitral valve repair)   S/P AVR (aortic valve replacement)   S/P Maze operation for atrial fibrillation   Renal disease   Tricuspid valve insufficiency   Malnutrition of moderate degree   Protein-calorie malnutrition, severe   Shock (HCC)   RVF (right ventricular failure) (HCC)   AKI (acute kidney injury) (HCC)   Physical deconditioning   Acute metabolic encephalopathy   Acute respiratory failure with hypoxia and hypercapnia (HCC)   Cardiogenic shock (HCC)   ESRD (end stage renal disease) on dialysis (HCC)   Severe protein-calorie malnutrition (HCC)   Pressure injury of skin   Atrial fibrillation (HCC) Pulmonary hypertension Complete heart block  Brief Hospital Course (including significant findings, care, treatment, and services provided and events leading to death)   History of Present Illness:     Pt is a very pleasant 52 yo wm who has been followed by Dr Scharlene Gloss for severe MR and TR. Pt has had increasing fatigue and when pushing himself having DOE. He is chronic hemodialysis for over 20 years and has not been having issues tolerating dialysis. He however with echo and TEE last year to have severe MR from what appears to be A1-2 flail. 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 He has mild AS with a mean gradient at cath and echo of only . He does have  moderate PHTN with SPA pressures of . He was felt that the best course for his multivalvular issues was surgery and pt has been asked to discuss this with cardiac surgery. He has varying Hgb levels reportedly without GI concerns but recently had some hematuria that he will be discussing with his urologist from [facility]  Dr. Leafy Ro reviewed the patient's diagnostic studies and determined he would benefit from surgical intervention. He reviewed the patient's treatment options and the risks and benefits of surgery. Mr. Neitzke was agreeable to proceed with surgery.   Hospital Course:  Mr. Overgaard arrived at [facility] and underwent tricuspid valve repair utilizing a 30mm MC3 ring, mitral valve repair utilizing a 30mm Simulus Band, aortic valve replacement utilizing a 27mm Inspiris valve, and Maze procedure. He tolerated the procedure well and was transferred to the SICU in stable condition.  The patient was weaned to extubated on 8/17.  He required support with Vasopressin, Epinephrine, Levophed all of which were weaned as hemodynamics allowed.  The patient has ESRD on dialysis, Nephrology was consulted and being patient was requiring pressor support he would require CRRT prior to initiation of dialysis.  The critical care team placed a dialysis catheter and this was initiated on 06/23/2023.  Post operative EKG showed complete heart block.  EP consult was obtained and they recommended close monitoring of patient's rhythm.  He required treatment with albumin and packed cells due to hypotension and post operative blood loss anemia.  He was hypokalemic and supplemented accordingly.  The patient's H/H remained low with  By: Carey Bullocks M.D.   On: 07/09/2023 13:28   DG Abd 1 View  Result Date: 07/06/2023 CLINICAL DATA:  Enteric catheter placement EXAM: ABDOMEN - 1 VIEW COMPARISON:  06/30/2023 FINDINGS: Frontal view of the lower chest and upper abdomen demonstrates enteric  catheter passing below diaphragm, tip projecting over the gastric antrum. Postsurgical changes are seen from aortic and mitral valve replacements. Single lead cardiac pacer tip overlies the right ventricle. Epicardial pacing wires are seen. Stable nonspecific gaseous distention of the transverse colon. No evidence of small-bowel obstruction. Cardiac silhouette is enlarged, with continued patchy airspace disease compatible with edema. IMPRESSION: 1. Enteric catheter tip projecting over the gastric antrum. Electronically Signed   By: Sharlet Salina M.D.   On: 07/06/2023 14:47   ECHOCARDIOGRAM LIMITED  Result Date: 07/06/2023    ECHOCARDIOGRAM LIMITED REPORT   Patient Name:   [name] Date of Exam: 07/06/2023 Medical Rec #:  [id number]  Height:       75.0 in Accession #:    [id number] Weight:       181.2 lb Date of Birth:  Sep 15, 1971  BSA:          2.104 m Patient Age:    52 years   BP:           104/38 mmHg Patient Gender: M          HR:           82 bpm. Exam Location:  Inpatient Procedure: Limited Echo, Color Doppler and Cardiac Doppler Indications:    I31.3 Pericardial effusion (noninflammatory)  History:        Patient has prior history of Echocardiogram examinations, most                 recent 06/29/2023. Arrythmias:Atrial Flutter; Risk                 Factors:Hypertension. Cardiac surgery on 06/26/2023 with 30mm TV                 Ring, 30mm MV Band, 27mm AVR Inspiris Resilia.  Sonographer:    Irving Burton Senior RDCS Referring Phys: [phone] [facility]  Sonographer Comments: Limited to recheck pericardial effusion IMPRESSIONS  1. Moderate pericardial effusion. The pericardial effusion is lateral to the left ventricle and surrounding the apex.  2. Left ventricular ejection fraction, by estimation, is 65 to 70%. The left ventricle has normal function.  3. Right ventricular systolic function is moderately reduced. The right ventricular size is moderately enlarged.  4. The mitral valve has been repaired/replaced.   5. The tricuspid valve is has been repaired/replaced.  6. The aortic valve has been repaired/replaced.  7. The inferior vena cava - dilated IVC, no inspiratory collapse, RA pressure 20 mmHg. Comparison(s): Pericardial effusion may be slightly less than prior exam. Valves not evaluated. FINDINGS  Left Ventricle: Left ventricular ejection fraction, by estimation, is 65 to 70%. The left ventricle has normal function. Right Ventricle: The right ventricular size is moderately enlarged. Right ventricular systolic function is moderately reduced. Pericardium: A moderately sized pericardial effusion is present. The pericardial effusion is lateral to the left ventricle and surrounding the apex. Mitral Valve: The mitral valve has been repaired/replaced. Tricuspid Valve: The tricuspid valve is has been repaired/replaced. Aortic Valve: The aortic valve has been repaired/replaced. Venous: The inferior vena cava - dilated IVC, no inspiratory collapse, RA pressure 20 mmHg. Additional Comments: Spectral Doppler performed. Color Doppler performed.  Weston Brass MD Electronically signed by Lynda Rainwater  the acquisition of a permanent ultrasound image documenting patency of the accessed vessel. The microwire was utilized to measure appropriate catheter length. A stiff Glidewire was advanced to the level of the IVC and the micropuncture sheath was exchanged for a peel-away sheath. A palindrome tunneled hemodialysis catheter measuring 23 cm from tip to cuff was tunneled in a retrograde fashion from the anterior chest wall to the venotomy incision. The catheter was then placed through the peel-away sheath with tips ultimately positioned within the superior aspect of the right atrium. Final  catheter positioning was confirmed and documented with a spot radiographic image. The catheter aspirates and flushes normally. The catheter was flushed with appropriate volume heparin dwells. The catheter exit site was secured with a 0-Prolene retention suture. The venotomy incision was closed with an interrupted 4-0 Vicryl, Dermabond and Steri-strips. At this point, the pre-existing temporary left jugular approach dialysis catheter was removed intact. Hemostasis was achieved with manual compression. Dressings were applied. The patient tolerated the procedure well without immediate post procedural complication. IMPRESSION: 1. Successful placement of 23 cm tip to cuff tunneled hemodialysis catheter via the left internal jugular vein with tips terminating within the superior aspect of the right atrium. The catheter is ready for immediate use. 2. Successful removal of pre-existing left jugular approach temporary dialysis catheter. Electronically Signed   By: Simonne Come M.D.   On: 07/19/2023 11:13   DG CHEST PORT 1 VIEW  Result Date: 07/18/2023 CLINICAL DATA:  [id number] Interstitial edema [id number] EXAM: PORTABLE CHEST 1 VIEW COMPARISON:  CXR 07/16/23 FINDINGS: Status post median sternotomy and valve repair. Left-sided central venous catheter place with the tip near the confluence of brachiocephalic veins. Right-sided central venous catheter in place with the tip in the similar location. Enteric tube courses below diaphragm with tip out of the field of view. Transvenous pacing lead terminates in the right ventricle. Cardiomegaly. No pleural effusion. No pneumothorax. Moderate pulmonary edema, increased from prior exam. No radiographically apparent displaced rib fractures. Visualized upper abdomen is unremarkable. IMPRESSION: 1. Moderate pulmonary edema, increased from prior exam. 2. Cardiomegaly. Electronically Signed   By: Lorenza Cambridge M.D.   On: 07/18/2023 10:46   ECHOCARDIOGRAM COMPLETE  Result Date: 07/17/2023     ECHOCARDIOGRAM REPORT   Patient Name:   [name] Date of Exam: 07/17/2023 Medical Rec #:  [id number]  Height:       75.0 in Accession #:    [id number] Weight:       167.8 lb Date of Birth:  Jun 03, 1971  BSA:          2.036 m Patient Age:    52 years   BP:           100/38 mmHg Patient Gender: M          HR:           80 bpm. Exam Location:  Inpatient Procedure: 2D Echo, Cardiac Doppler and Color Doppler Indications:    S/P Aortic Valve replacement Z95.2  History:        Patient has prior history of Echocardiogram examinations, most                 recent 07/09/2023. CHF and Cardiomegaly, Aortic Valve Disease and                 Mitral Valve Disease; Risk Factors:Hypertension.                 Aortic Valve: 27 mm bioprosthetic valve is present  DEATH SUMMARY   Patient Details  Name: [name] MRN: [id number] DOB: [birth year]  Admission/Discharge Information   Admit Date:  2023/07/11  Date of Death: Date of Death: 19-Aug-2023  Time of Death: Time of Death: August 25, 1531  Length of Stay: 08-24-2038  Referring Physician: Donne Anon, MD   Reason(s) for Hospitalization  Surgical management of severe mitral insufficiency and tricuspid insufficiency and atrial fibrillation  Diagnoses  Preliminary cause of death:  Secondary Diagnoses (including complications and co-morbidities):  Principal Problem:   S/P tricuspid valve repair Active Problems:   S/P MVR (mitral valve repair)   S/P AVR (aortic valve replacement)   S/P Maze operation for atrial fibrillation   Renal disease   Tricuspid valve insufficiency   Malnutrition of moderate degree   Protein-calorie malnutrition, severe   Shock (HCC)   RVF (right ventricular failure) (HCC)   AKI (acute kidney injury) (HCC)   Physical deconditioning   Acute metabolic encephalopathy   Acute respiratory failure with hypoxia and hypercapnia (HCC)   Cardiogenic shock (HCC)   ESRD (end stage renal disease) on dialysis (HCC)   Severe protein-calorie malnutrition (HCC)   Pressure injury of skin   Atrial fibrillation (HCC) Pulmonary hypertension Complete heart block  Brief Hospital Course (including significant findings, care, treatment, and services provided and events leading to death)   History of Present Illness:     Pt is a very pleasant 52 yo wm who has been followed by Dr Scharlene Gloss for severe MR and TR. Pt has had increasing fatigue and when pushing himself having DOE. He is chronic hemodialysis for over 20 years and has not been having issues tolerating dialysis. He however with echo and TEE last year to have severe MR from what appears to be A1-2 flail. 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 He has mild AS with a mean gradient at cath and echo of only . He does have  moderate PHTN with SPA pressures of . He was felt that the best course for his multivalvular issues was surgery and pt has been asked to discuss this with cardiac surgery. He has varying Hgb levels reportedly without GI concerns but recently had some hematuria that he will be discussing with his urologist from [facility]  Dr. Leafy Ro reviewed the patient's diagnostic studies and determined he would benefit from surgical intervention. He reviewed the patient's treatment options and the risks and benefits of surgery. Mr. Neitzke was agreeable to proceed with surgery.   Hospital Course:  Mr. Overgaard arrived at [facility] and underwent tricuspid valve repair utilizing a 30mm MC3 ring, mitral valve repair utilizing a 30mm Simulus Band, aortic valve replacement utilizing a 27mm Inspiris valve, and Maze procedure. He tolerated the procedure well and was transferred to the SICU in stable condition.  The patient was weaned to extubated on 8/17.  He required support with Vasopressin, Epinephrine, Levophed all of which were weaned as hemodynamics allowed.  The patient has ESRD on dialysis, Nephrology was consulted and being patient was requiring pressor support he would require CRRT prior to initiation of dialysis.  The critical care team placed a dialysis catheter and this was initiated on 06/23/2023.  Post operative EKG showed complete heart block.  EP consult was obtained and they recommended close monitoring of patient's rhythm.  He required treatment with albumin and packed cells due to hypotension and post operative blood loss anemia.  He was hypokalemic and supplemented accordingly.  The patient's H/H remained low with  By: Carey Bullocks M.D.   On: 07/09/2023 13:28   DG Abd 1 View  Result Date: 07/06/2023 CLINICAL DATA:  Enteric catheter placement EXAM: ABDOMEN - 1 VIEW COMPARISON:  06/30/2023 FINDINGS: Frontal view of the lower chest and upper abdomen demonstrates enteric  catheter passing below diaphragm, tip projecting over the gastric antrum. Postsurgical changes are seen from aortic and mitral valve replacements. Single lead cardiac pacer tip overlies the right ventricle. Epicardial pacing wires are seen. Stable nonspecific gaseous distention of the transverse colon. No evidence of small-bowel obstruction. Cardiac silhouette is enlarged, with continued patchy airspace disease compatible with edema. IMPRESSION: 1. Enteric catheter tip projecting over the gastric antrum. Electronically Signed   By: Sharlet Salina M.D.   On: 07/06/2023 14:47   ECHOCARDIOGRAM LIMITED  Result Date: 07/06/2023    ECHOCARDIOGRAM LIMITED REPORT   Patient Name:   [name] Date of Exam: 07/06/2023 Medical Rec #:  [id number]  Height:       75.0 in Accession #:    [id number] Weight:       181.2 lb Date of Birth:  Sep 15, 1971  BSA:          2.104 m Patient Age:    52 years   BP:           104/38 mmHg Patient Gender: M          HR:           82 bpm. Exam Location:  Inpatient Procedure: Limited Echo, Color Doppler and Cardiac Doppler Indications:    I31.3 Pericardial effusion (noninflammatory)  History:        Patient has prior history of Echocardiogram examinations, most                 recent 06/29/2023. Arrythmias:Atrial Flutter; Risk                 Factors:Hypertension. Cardiac surgery on 06/26/2023 with 30mm TV                 Ring, 30mm MV Band, 27mm AVR Inspiris Resilia.  Sonographer:    Irving Burton Senior RDCS Referring Phys: [phone] [facility]  Sonographer Comments: Limited to recheck pericardial effusion IMPRESSIONS  1. Moderate pericardial effusion. The pericardial effusion is lateral to the left ventricle and surrounding the apex.  2. Left ventricular ejection fraction, by estimation, is 65 to 70%. The left ventricle has normal function.  3. Right ventricular systolic function is moderately reduced. The right ventricular size is moderately enlarged.  4. The mitral valve has been repaired/replaced.   5. The tricuspid valve is has been repaired/replaced.  6. The aortic valve has been repaired/replaced.  7. The inferior vena cava - dilated IVC, no inspiratory collapse, RA pressure 20 mmHg. Comparison(s): Pericardial effusion may be slightly less than prior exam. Valves not evaluated. FINDINGS  Left Ventricle: Left ventricular ejection fraction, by estimation, is 65 to 70%. The left ventricle has normal function. Right Ventricle: The right ventricular size is moderately enlarged. Right ventricular systolic function is moderately reduced. Pericardium: A moderately sized pericardial effusion is present. The pericardial effusion is lateral to the left ventricle and surrounding the apex. Mitral Valve: The mitral valve has been repaired/replaced. Tricuspid Valve: The tricuspid valve is has been repaired/replaced. Aortic Valve: The aortic valve has been repaired/replaced. Venous: The inferior vena cava - dilated IVC, no inspiratory collapse, RA pressure 20 mmHg. Additional Comments: Spectral Doppler performed. Color Doppler performed.  Weston Brass MD Electronically signed by Lynda Rainwater  Jacques Navy MD Signature Date/Time: 07/06/2023/1:24:33 PM    Final    DG CHEST PORT 1 VIEW  Result Date: 06/20/2023 CLINICAL DATA:  Central line placement EXAM: PORTABLE CHEST 1 VIEW COMPARISON:  07/06/2023 at 0600 hours FINDINGS: Cardiomegaly with mild interstitial edema. No definite pleural effusions. No pneumothorax. New right IJ transvenous pacemaker lead, in satisfactory position. Left IJ dual lumen dialysis catheter terminating in the mid SVC. Right subclavian venous catheter terminating in the mid SVC. Enteric tube coursing into the stomach. Prosthetic valves.  Median sternotomy. IMPRESSION: New right IJ transvenous pacemaker lead, in satisfactory position. No pneumothorax. Cardiomegaly with mild interstitial edema. Electronically Signed   By: Charline Bills M.D.   On: 06/14/2023 16:22   ECHOCARDIOGRAM COMPLETE  Result Date:  06/10/2023    ECHOCARDIOGRAM REPORT   Patient Name:   [name] Date of Exam: 07/03/2023 Medical Rec #:  [id number]  Height:       75.0 in Accession #:    [id number] Weight:       186.1 lb Date of Birth:  Mar 21, 1971  BSA:          2.128 m Patient Age:    52 years   BP:           0/0 mmHg Patient Gender: M          HR:           47 bpm.47 Exam Location:  Inpatient Procedure: 2D Echo Indications:    Prior Valve disease, unclear blood pressure  History:        Patient has prior history of Echocardiogram examinations. Aortic                 Valve Disease and Mitral Valve Disease.  Sonographer:    Data processing manager Referring Phys: [phone] DALTON S [facility]LEAN IMPRESSIONS  1. Left ventricular ejection fraction, by estimation, is 70 to 75%. The left ventricle has hyperdynamic function. The left ventricle has no regional wall motion abnormalities. There is severe concentric left ventricular hypertrophy. Left ventricular diastolic parameters are indeterminate.  2. Right ventricular systolic function is moderately reduced. The right ventricular size is severely enlarged. Mildly increased right ventricular wall thickness.  3. Left atrial size was moderately dilated.  4. Right atrial size was moderately dilated.  5. Large pericardial effusion. The pericardial effusion is lateral to the left ventricle and circumferential. There is no evidence of cardiac tamponade.  6. 2D MVA 3.16 cm2. 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 The mitral valve has been repaired/replaced. No evidence of mitral valve regurgitation.  7. Mean gradient 4.4 mm Hg at a heart rate of 54 bpm. The tricuspid valve is has been repaired/replaced.  8. The aortic valve has been repaired/replaced. Aortic valve regurgitation is not visualized. Aortic valve mean gradient measures 18.0 mmHg. Aortic valve acceleration time measures 86 msec. Comparison(s): Prior images reviewed side by side. Mitral mean gradients have improved at lower heart rates. Pericardial effusion  size has increased. Aortic flows not well seen on prior study. FINDINGS  Left Ventricle: Left ventricular ejection fraction, by estimation, is 70 to 75%. The left ventricle has hyperdynamic function. The left ventricle has no regional wall motion abnormalities. The left ventricular internal cavity size was normal in size. There is severe concentric left ventricular hypertrophy. Left ventricular diastolic parameters are indeterminate. Right Ventricle: The right ventricular size is severely enlarged. Mildly increased right ventricular wall thickness. Right ventricular systolic function is moderately reduced. Left Atrium: Left atrial size was moderately dilated. Right  Maisie Fus, MD;  Location: [facility] ENDOSCOPY;  Service: Cardiovascular;  Laterality: N/A;  TEE WITHOUT CARDIOVERSION N/A 06/10/2023  Procedure: TRANSESOPHAGEAL ECHOCARDIOGRAM;  Surgeon: Eugenio Hoes, MD;  Location: [facility] OR;  Service: Open Heart Surgery;  Laterality: N/A;  TEMPORARY PACEMAKER N/A 06/30/2023  Procedure: TEMPORARY PACEMAKER;  Surgeon: Orbie Pyo, MD;  Location: [facility] INVASIVE CV LAB;  Service: Cardiovascular;  Laterality: N/A;  TRICUSPID VALVE REPLACEMENT N/A 06/20/2023  Procedure: TRICUSPID VALVE REPAIR MC3 Ring size 30;  Surgeon: Eugenio Hoes, MD;  Location: [facility] OR;  Service: Open Heart Surgery;  Laterality: N/A; Claudine Mouton 07/27/2023, 2:09 PM  ECHOCARDIOGRAM COMPLETE  Result Date: 07/17/2023    ECHOCARDIOGRAM REPORT   Patient Name:   TREMIR SIEGMANN Date of Exam:  08/04/2023 Medical Rec #:  [id number]  Height:       75.0 in Accession #:    [id number] Weight:       163.1 lb Date of Birth:  December 19, 1970  BSA:          2.012 m Patient Age:    52 years   BP:           92/39 mmHg Patient Gender: M          HR:           82 bpm. Exam Location:  Inpatient Procedure: 2D Echo, Cardiac Doppler and Color Doppler Indications:    s/p MVR  History:        Patient has prior history of Echocardiogram examinations, most                 recent 07/17/2023. CHF and Cardiomyopathy, CAD, Pulmonary HTN, 30                 mm TV ring; Risk Factors:Sleep Apnea and Hypertension. ESRD on                 HD.                 Aortic Valve: 27 mm bioprosthetic valve is present in the aortic                 position.                 Mitral Valve: 30 mm MV Band valve is present in the mitral                 position.  Sonographer:    Milda Smart Referring Phys: [id number] Eugenio Hoes  Sonographer Comments: Image acquisition challenging due to patient body habitus and Image acquisition challenging due to respiratory motion. IMPRESSIONS  1. Left ventricular ejection fraction, by estimation, is 60 to 65%. The left ventricle has normal function. The left ventricle has no regional wall motion abnormalities. There is moderate concentric left ventricular hypertrophy. Left ventricular diastolic parameters are indeterminate. There is the interventricular septum is flattened in systole and diastole, consistent with right ventricular pressure and volume overload.  2. Right ventricular systolic function is severely reduced. The right ventricular size is severely enlarged. There is severely elevated pulmonary artery systolic pressure. The estimated right ventricular systolic pressure is 46.6 mmHg.  3. Left atrial size was severely dilated.  4. Right atrial size was moderately dilated.  5. The mitral valve has been repaired/replaced. No evidence of mitral valve regurgitation. Moderate mitral stenosis. The mean mitral valve  gradient is 8.0 mmHg. There is a 30 mm MV Band present in the mitral position.  6. Moderate pericardial effusion. The  Jacques Navy MD Signature Date/Time: 07/06/2023/1:24:33 PM    Final    DG CHEST PORT 1 VIEW  Result Date: 06/20/2023 CLINICAL DATA:  Central line placement EXAM: PORTABLE CHEST 1 VIEW COMPARISON:  07/06/2023 at 0600 hours FINDINGS: Cardiomegaly with mild interstitial edema. No definite pleural effusions. No pneumothorax. New right IJ transvenous pacemaker lead, in satisfactory position. Left IJ dual lumen dialysis catheter terminating in the mid SVC. Right subclavian venous catheter terminating in the mid SVC. Enteric tube coursing into the stomach. Prosthetic valves.  Median sternotomy. IMPRESSION: New right IJ transvenous pacemaker lead, in satisfactory position. No pneumothorax. Cardiomegaly with mild interstitial edema. Electronically Signed   By: Charline Bills M.D.   On: 06/14/2023 16:22   ECHOCARDIOGRAM COMPLETE  Result Date:  06/10/2023    ECHOCARDIOGRAM REPORT   Patient Name:   [name] Date of Exam: 07/03/2023 Medical Rec #:  [id number]  Height:       75.0 in Accession #:    [id number] Weight:       186.1 lb Date of Birth:  Mar 21, 1971  BSA:          2.128 m Patient Age:    52 years   BP:           0/0 mmHg Patient Gender: M          HR:           47 bpm.47 Exam Location:  Inpatient Procedure: 2D Echo Indications:    Prior Valve disease, unclear blood pressure  History:        Patient has prior history of Echocardiogram examinations. Aortic                 Valve Disease and Mitral Valve Disease.  Sonographer:    Data processing manager Referring Phys: [phone] DALTON S MCLEAN IMPRESSIONS  1. Left ventricular ejection fraction, by estimation, is 70 to 75%. The left ventricle has hyperdynamic function. The left ventricle has no regional wall motion abnormalities. There is severe concentric left ventricular hypertrophy. Left ventricular diastolic parameters are indeterminate.  2. Right ventricular systolic function is moderately reduced. The right ventricular size is severely enlarged. Mildly increased right ventricular wall thickness.  3. Left atrial size was moderately dilated.  4. Right atrial size was moderately dilated.  5. Large pericardial effusion. The pericardial effusion is lateral to the left ventricle and circumferential. There is no evidence of cardiac tamponade.  6. 2D MVA 3.16 cm2. 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 The mitral valve has been repaired/replaced. No evidence of mitral valve regurgitation.  7. Mean gradient 4.4 mm Hg at a heart rate of 54 bpm. The tricuspid valve is has been repaired/replaced.  8. The aortic valve has been repaired/replaced. Aortic valve regurgitation is not visualized. Aortic valve mean gradient measures 18.0 mmHg. Aortic valve acceleration time measures 86 msec. Comparison(s): Prior images reviewed side by side. Mitral mean gradients have improved at lower heart rates. Pericardial effusion  size has increased. Aortic flows not well seen on prior study. FINDINGS  Left Ventricle: Left ventricular ejection fraction, by estimation, is 70 to 75%. The left ventricle has hyperdynamic function. The left ventricle has no regional wall motion abnormalities. The left ventricular internal cavity size was normal in size. There is severe concentric left ventricular hypertrophy. Left ventricular diastolic parameters are indeterminate. Right Ventricle: The right ventricular size is severely enlarged. Mildly increased right ventricular wall thickness. Right ventricular systolic function is moderately reduced. Left Atrium: Left atrial size was moderately dilated. Right  By: Carey Bullocks M.D.   On: 07/09/2023 13:28   DG Abd 1 View  Result Date: 07/06/2023 CLINICAL DATA:  Enteric catheter placement EXAM: ABDOMEN - 1 VIEW COMPARISON:  06/30/2023 FINDINGS: Frontal view of the lower chest and upper abdomen demonstrates enteric  catheter passing below diaphragm, tip projecting over the gastric antrum. Postsurgical changes are seen from aortic and mitral valve replacements. Single lead cardiac pacer tip overlies the right ventricle. Epicardial pacing wires are seen. Stable nonspecific gaseous distention of the transverse colon. No evidence of small-bowel obstruction. Cardiac silhouette is enlarged, with continued patchy airspace disease compatible with edema. IMPRESSION: 1. Enteric catheter tip projecting over the gastric antrum. Electronically Signed   By: Sharlet Salina M.D.   On: 07/06/2023 14:47   ECHOCARDIOGRAM LIMITED  Result Date: 07/06/2023    ECHOCARDIOGRAM LIMITED REPORT   Patient Name:   [name] Date of Exam: 07/06/2023 Medical Rec #:  [id number]  Height:       75.0 in Accession #:    [id number] Weight:       181.2 lb Date of Birth:  Sep 15, 1971  BSA:          2.104 m Patient Age:    52 years   BP:           104/38 mmHg Patient Gender: M          HR:           82 bpm. Exam Location:  Inpatient Procedure: Limited Echo, Color Doppler and Cardiac Doppler Indications:    I31.3 Pericardial effusion (noninflammatory)  History:        Patient has prior history of Echocardiogram examinations, most                 recent 06/29/2023. Arrythmias:Atrial Flutter; Risk                 Factors:Hypertension. Cardiac surgery on 06/26/2023 with 30mm TV                 Ring, 30mm MV Band, 27mm AVR Inspiris Resilia.  Sonographer:    Irving Burton Senior RDCS Referring Phys: [phone] [facility]  Sonographer Comments: Limited to recheck pericardial effusion IMPRESSIONS  1. Moderate pericardial effusion. The pericardial effusion is lateral to the left ventricle and surrounding the apex.  2. Left ventricular ejection fraction, by estimation, is 65 to 70%. The left ventricle has normal function.  3. Right ventricular systolic function is moderately reduced. The right ventricular size is moderately enlarged.  4. The mitral valve has been repaired/replaced.   5. The tricuspid valve is has been repaired/replaced.  6. The aortic valve has been repaired/replaced.  7. The inferior vena cava - dilated IVC, no inspiratory collapse, RA pressure 20 mmHg. Comparison(s): Pericardial effusion may be slightly less than prior exam. Valves not evaluated. FINDINGS  Left Ventricle: Left ventricular ejection fraction, by estimation, is 65 to 70%. The left ventricle has normal function. Right Ventricle: The right ventricular size is moderately enlarged. Right ventricular systolic function is moderately reduced. Pericardium: A moderately sized pericardial effusion is present. The pericardial effusion is lateral to the left ventricle and surrounding the apex. Mitral Valve: The mitral valve has been repaired/replaced. Tricuspid Valve: The tricuspid valve is has been repaired/replaced. Aortic Valve: The aortic valve has been repaired/replaced. Venous: The inferior vena cava - dilated IVC, no inspiratory collapse, RA pressure 20 mmHg. Additional Comments: Spectral Doppler performed. Color Doppler performed.  Weston Brass MD Electronically signed by Lynda Rainwater  Maisie Fus, MD;  Location: [facility] ENDOSCOPY;  Service: Cardiovascular;  Laterality: N/A;  TEE WITHOUT CARDIOVERSION N/A 06/10/2023  Procedure: TRANSESOPHAGEAL ECHOCARDIOGRAM;  Surgeon: Eugenio Hoes, MD;  Location: [facility] OR;  Service: Open Heart Surgery;  Laterality: N/A;  TEMPORARY PACEMAKER N/A 06/30/2023  Procedure: TEMPORARY PACEMAKER;  Surgeon: Orbie Pyo, MD;  Location: [facility] INVASIVE CV LAB;  Service: Cardiovascular;  Laterality: N/A;  TRICUSPID VALVE REPLACEMENT N/A 06/20/2023  Procedure: TRICUSPID VALVE REPAIR MC3 Ring size 30;  Surgeon: Eugenio Hoes, MD;  Location: [facility] OR;  Service: Open Heart Surgery;  Laterality: N/A; Claudine Mouton 07/27/2023, 2:09 PM  ECHOCARDIOGRAM COMPLETE  Result Date: 07/17/2023    ECHOCARDIOGRAM REPORT   Patient Name:   TREMIR SIEGMANN Date of Exam:  08/04/2023 Medical Rec #:  [id number]  Height:       75.0 in Accession #:    [id number] Weight:       163.1 lb Date of Birth:  December 19, 1970  BSA:          2.012 m Patient Age:    52 years   BP:           92/39 mmHg Patient Gender: M          HR:           82 bpm. Exam Location:  Inpatient Procedure: 2D Echo, Cardiac Doppler and Color Doppler Indications:    s/p MVR  History:        Patient has prior history of Echocardiogram examinations, most                 recent 07/17/2023. CHF and Cardiomyopathy, CAD, Pulmonary HTN, 30                 mm TV ring; Risk Factors:Sleep Apnea and Hypertension. ESRD on                 HD.                 Aortic Valve: 27 mm bioprosthetic valve is present in the aortic                 position.                 Mitral Valve: 30 mm MV Band valve is present in the mitral                 position.  Sonographer:    Milda Smart Referring Phys: [id number] Eugenio Hoes  Sonographer Comments: Image acquisition challenging due to patient body habitus and Image acquisition challenging due to respiratory motion. IMPRESSIONS  1. Left ventricular ejection fraction, by estimation, is 60 to 65%. The left ventricle has normal function. The left ventricle has no regional wall motion abnormalities. There is moderate concentric left ventricular hypertrophy. Left ventricular diastolic parameters are indeterminate. There is the interventricular septum is flattened in systole and diastole, consistent with right ventricular pressure and volume overload.  2. Right ventricular systolic function is severely reduced. The right ventricular size is severely enlarged. There is severely elevated pulmonary artery systolic pressure. The estimated right ventricular systolic pressure is 46.6 mmHg.  3. Left atrial size was severely dilated.  4. Right atrial size was moderately dilated.  5. The mitral valve has been repaired/replaced. No evidence of mitral valve regurgitation. Moderate mitral stenosis. The mean mitral valve  gradient is 8.0 mmHg. There is a 30 mm MV Band present in the mitral position.  6. Moderate pericardial effusion. The

## 2023-08-08 NOTE — Progress Notes (Signed)
Contacted [employer] and spoke to clinic social worker to advise clinic that pt had been transitioned to comfort care. While on the phone with staff, noted note of pt's passing. Clinic staff advised pt passed away this afternoon.   Olivia Canter Renal Navigator [phone]

## 2023-08-08 NOTE — Progress Notes (Signed)
This chaplain responded to PMT NP-Amber consult for emotional support for the Pt. family as the Pt. transitions to comfort care today. The Pt. parents along with two aunts are at the bedside. The chaplain began rapport building with the family as the Pt. father participated in story telling with the chaplain.   The chaplain understands the Pt. 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   The chaplain offered continued support and education on how to reach the chaplain. A blessing was shared with the Pt. and family before leaving the quiet space.  Chaplain Stephanie Acre [phone]

## 2023-08-08 NOTE — Progress Notes (Signed)
Daily Progress Note   Patient Name: [name]       Date: 07/27/2023 DOB: [birth year]  Age: 52 y.o. MRN#: [id number] Attending Physician: Eugenio Hoes, MD Primary Care Physician: Donne Anon, MD Admit Date: 07/07/2023  Reason for Consultation/Follow-up: Establishing goals of care  Subjective: Medical records reviewed including progress notes,  MAR. Patient has required 6 doses of PRN dilaudid in the past 24 hours. Discussed with patient's parents as they exited 2H unit, providing emotional support and therapeutic listening. They understand that the plan is to stop pacing tomorrow at 2pm.  Patient was then assessed at the bedside. He is lethargic, although restless and moving his head from side to side while grimacing. His aunt is present visiting and states this has just started recently, right after his parents left. Discussed with RN and advised to give PRN ativan.   Questions and concerns addressed. PMT will continue to support holistically.   Length of Stay: 52   Physical Exam Vitals and nursing note reviewed.  Constitutional:      General: He is not in acute distress.    Appearance: He is cachectic. He is ill-appearing.     Interventions: Nasal cannula in place.  Cardiovascular:     Rate and Rhythm: Normal rate.  Pulmonary:     Effort: Pulmonary effort is normal.  Skin:    General: Skin is cool and dry.  Neurological:     Mental Status: He is alert.            Vital Signs: BP (!) 90/38 (BP Location: Left Arm)   Pulse (!) 193   Temp 97.7 F (36.5 C) (Oral)   Resp 20   Ht 6\' 3"  (1.905 m)   Wt 75 kg   SpO2 100%   BMI 20.67 kg/m  SpO2: SpO2: 100 % O2 Device: O2 Device: Nasal Cannula O2 Flow Rate: O2 Flow Rate (L/min): 6 L/min (for comfort)      Palliative  Assessment/Data: 20%   Palliative Care Assessment & Plan   Patient Profile: 52 y.o. male  with past medical history of ESRD on HD 20 year s/p failed renal transplant in 1999 and 2015, moderate pulmonary HTN, severe MR, TR, and severe AS s/p repairs, anxiety, depression, bipolar disorder, CAD, afib s/p MAZE admitted on 07/06/2023 with fatigue and DOE  due to valvular disease.    Patient underwent MVR/AVR/TVR 8/15 with post-op course complicated by distributive shock and vasospastic syndrome. He underwent temporary pacemaker placement on 8/27 for heart block and was on CRRT 8/16-8/25, then resumed on 8/26 for optimization of hemodynamics and reduction of pressor requirements. There is concern for AV fistula in the setting of high output HF.   PMT has been consulted to assist with goals of care conversation on 8/29. Initial request from [facility] was to defer initial visit to 9/2, however patient's pressor requirements then increased overnight and PMT was asked to see by AHF team on 8/30.  Assessment: Goals of care conversation  AKI on ESRD, on/off CRRT Severe pulmonary hypertension  Acute on chronic HFrEF MR, TR, AS s/p valvular repair x3 Post-op distributive/cardiogenic shock Post-op CHB Severe protein calorie malnutrition Dysphagia  End of life care  Recommendations/Plan: Continue DNR/DNI Continue comfort-focused care, no adjustments required to care plan today. Advised RN to give dose of PRN ativan for restlessness Family remains agreeable to discontinuation of pacing tomorrow 9/24 at 2pm Psychosocial and emotional support provided PMT will continue to follow and support   Prognosis:  Hours to Days  Discharge Planning: Anticipated Hospital Death   Care plan was discussed with Patient, patient's family, RN   MDM: High   Reeta Kuk Jeni Salles, PA-C  Palliative Medicine Team Team phone # [phone]  Thank you for allowing the Palliative Medicine Team to assist in the care of  this patient. Please utilize secure chat with additional questions, if there is no response within 30 minutes please call the above phone number.  Palliative Medicine Team providers are available by phone from 7am to 7pm daily and can be reached through the team cell phone.  Should this patient require assistance outside of these hours, please call the patient's attending physician.

## 2023-08-08 NOTE — Progress Notes (Signed)
08/01/2023 Asystole on monitor Absent breath sounds No heart sounds Pupils fixed, dilated TOD 15:32

## 2023-08-08 NOTE — Progress Notes (Signed)
07/16/2023     I have seen and evaluated the patient for end of life care.   S:  Somnolent, starting to having gurgling respirations.   O: Blood pressure 96/66, pulse 64, temperature 97.6 F (36.4 C), temperature source Oral, resp. rate 17, height 5\' 3"  (1.6 m), weight 81.6 kg, SpO2 94 %.  Frail thin man laying in bed Regular but gurgling respirations Ext lukewarm +muscle wasting   A:  EOL care due to profound ongoing heart failure and inability to tolerate dialysis   P:  - Opiates for air hunger - Scopolamine patch for secretions - Okay to remove cortrak - Plan tentatively to remove pacer tomorrow and allow natural passing   Myrla Halsted MD [facility] Prefer epic messenger for cross cover needs If after hours, please call E-link

## 2023-08-08 NOTE — Progress Notes (Signed)
  Chart reviewed. Patient transitioned to comfort care.  AHF team will sign off.   Please call with questions.   Arvilla Meres, MD  2:25 PM

## 2023-08-08 NOTE — Plan of Care (Signed)
  Problem: Coping: Goal: Level of anxiety will decrease Outcome: Progressing   Problem: Pain Managment: Goal: General experience of comfort will improve Outcome: Progressing   Problem: Safety: Goal: Ability to remain free from injury will improve Outcome: Progressing   Problem: Education: Goal: Knowledge of the prescribed therapeutic regimen will improve Outcome: Progressing   Problem: Coping: Goal: Ability to identify and develop effective coping behavior will improve Outcome: Progressing   Problem: Clinical Measurements: Goal: Quality of life will improve Outcome: Progressing   Problem: Respiratory: Goal: Verbalizations of increased ease of respirations will increase Outcome: Progressing   Problem: Role Relationship: Goal: Family's ability to cope with current situation will improve Outcome: Progressing Goal: Ability to verbalize concerns, feelings, and thoughts to partner or family member will improve Outcome: Progressing   Problem: Pain Management: Goal: Satisfaction with pain management regimen will improve Outcome: Progressing

## 2023-08-08 NOTE — Progress Notes (Signed)
[address]       [place] [address]             [phone]      7 Days Post-Op  Procedure(s) (LRB): RIGHT HEART CATH (N/A)   Total Length of Stay:  LOS: 39 days    SUBJECTIVE: Failed dialysis run For discontinuation of pacing on tuesday  Vitals:   08/05/2023 0600 07/09/2023 0700  BP:    Pulse: (!) 193   Resp: 18 17  Temp:    SpO2: 100%     Intake/Output      09/22 0701 09/23 0700 09/23 0701 09/24 0700   I.V. (mL/kg) 156 (2.1)    NG/GT 1750    Total Intake(mL/kg) 1906 (25.4)    Other     Total Output     Net +1906              CBC    Component Value Date/Time   WBC 7.0 07/30/2023 0443   RBC 2.69 (L) 07/30/2023 0443   HGB 8.6 (L) 07/30/2023 0443   HCT 28.2 (L) 07/30/2023 0443   PLT 235 07/30/2023 0443   MCV 104.8 (H) 07/30/2023 0443   MCH 32.0 07/30/2023 0443   MCHC 30.5 07/30/2023 0443   RDW 19.1 (H) 07/30/2023 0443   LYMPHSABS 1.6 07/18/2023 0423   MONOABS 1.3 (H) 07/18/2023 0423   EOSABS 0.4 07/18/2023 0423   BASOSABS 0.1 07/18/2023 0423   CMP     Component Value Date/Time   NA 132 (L) 07/29/2023 0845   K 6.0 (H) 07/29/2023 0845   CL 99 07/29/2023 0845   CO2 22 07/29/2023 0845   GLUCOSE 85 07/29/2023 0845   BUN 81 (H) 07/29/2023 0845   CREATININE 4.62 (H) 07/29/2023 0845   CALCIUM 9.9 07/29/2023 0845   CALCIUM 10.4 (H) 07/23/2023 0457   PROT 8.9 (H) 07/18/2023 0423   ALBUMIN 2.7 (L) 07/27/2023 0530   AST 45 (H) 07/18/2023 0423   ALT 17 07/18/2023 0423   ALKPHOS 117 07/18/2023 0423   BILITOT 0.5 07/18/2023 0423   GFRNONAA 15 (L) 07/29/2023 0845   GFRAA 10 (L) 03/16/2020 0825   ABG    Component Value Date/Time   PHART 7.317 (L) 07/11/2023 1346   PCO2ART 48.2 (H) 07/11/2023 1346   PO2ART 103 07/11/2023 1346   HCO3 25.5 07/15/2023 0959   TCO2 27 07/17/2023 0959   ACIDBASEDEF 1.0 07/09/2023 1517   O2SAT 78.2 07/30/2023 0443   CBG (last 3)  Recent Labs    07/30/23 0748 07/30/23 1014 07/30/23 1253   GLUCAP 131* 115* 142*     ASSESSMENT: Palliative care Discussed sedation orders with CCM till then   Eugenio Hoes, MD 07/16/2023

## 2023-08-08 NOTE — Progress Notes (Signed)
Nutrition Brief Note  Chart reviewed. Pt now transitioned to comfort care. Allowed Regular diet for comfort. TF discontinued. If not already done so, recommend removal of Cortrak unless needing to utilize for meds for comfort only. No further nutrition interventions planned at this time.  Please re-consult as needed.   [name] MS, RDN, LDN, CNSC Registered Dietitian 3 Clinical Nutrition RD Pager and On-Call Pager Number Located in [place]

## 2023-08-08 DEATH — deceased
